# Patient Record
Sex: Female | Born: 1975 | Race: Black or African American | Hispanic: No | Marital: Single | Smoking: Never smoker
Health system: Southern US, Community
[De-identification: ages and names within clinical notes are randomized; demographics above are authoritative.]

## PROBLEM LIST (undated history)

## (undated) DIAGNOSIS — F329 Major depressive disorder, single episode, unspecified: Secondary | ICD-10-CM

## (undated) DIAGNOSIS — I639 Cerebral infarction, unspecified: Secondary | ICD-10-CM

## (undated) DIAGNOSIS — G43909 Migraine, unspecified, not intractable, without status migrainosus: Secondary | ICD-10-CM

## (undated) DIAGNOSIS — E119 Type 2 diabetes mellitus without complications: Secondary | ICD-10-CM

## (undated) DIAGNOSIS — F419 Anxiety disorder, unspecified: Secondary | ICD-10-CM

## (undated) DIAGNOSIS — E282 Polycystic ovarian syndrome: Secondary | ICD-10-CM

## (undated) DIAGNOSIS — E669 Obesity, unspecified: Secondary | ICD-10-CM

## (undated) DIAGNOSIS — F32A Depression, unspecified: Secondary | ICD-10-CM

## (undated) DIAGNOSIS — M542 Cervicalgia: Secondary | ICD-10-CM

## (undated) DIAGNOSIS — E041 Nontoxic single thyroid nodule: Secondary | ICD-10-CM

## (undated) DIAGNOSIS — E785 Hyperlipidemia, unspecified: Secondary | ICD-10-CM

## (undated) DIAGNOSIS — M2550 Pain in unspecified joint: Secondary | ICD-10-CM

## (undated) DIAGNOSIS — D573 Sickle-cell trait: Secondary | ICD-10-CM

## (undated) DIAGNOSIS — R531 Weakness: Secondary | ICD-10-CM

## (undated) DIAGNOSIS — M25519 Pain in unspecified shoulder: Secondary | ICD-10-CM

## (undated) DIAGNOSIS — I10 Essential (primary) hypertension: Secondary | ICD-10-CM

## (undated) DIAGNOSIS — R569 Unspecified convulsions: Secondary | ICD-10-CM

## (undated) HISTORY — DX: Essential (primary) hypertension: I10

## (undated) HISTORY — DX: Type 2 diabetes mellitus without complications: E11.9

## (undated) HISTORY — DX: Unspecified convulsions: R56.9

## (undated) HISTORY — DX: Obesity, unspecified: E66.9

## (undated) HISTORY — DX: Depression, unspecified: F32.A

## (undated) HISTORY — DX: Anxiety disorder, unspecified: F41.9

## (undated) HISTORY — DX: Major depressive disorder, single episode, unspecified: F32.9

## (undated) HISTORY — DX: Cerebral infarction, unspecified: I63.9

## (undated) HISTORY — DX: Hyperlipidemia, unspecified: E78.5

## (undated) HISTORY — DX: Weakness: R53.1

## (undated) HISTORY — DX: Migraine, unspecified, not intractable, without status migrainosus: G43.909

## (undated) HISTORY — PX: LAPAROSCOPIC CHOLECYSTECTOMY: SUR755

## (undated) HISTORY — DX: Cervicalgia: M54.2

## (undated) HISTORY — DX: Pain in unspecified joint: M25.50

## (undated) HISTORY — DX: Pain in unspecified shoulder: M25.519

## (undated) HISTORY — DX: Nontoxic single thyroid nodule: E04.1

---

## 1998-09-30 ENCOUNTER — Ambulatory Visit (HOSPITAL_COMMUNITY): Admission: RE | Admit: 1998-09-30 | Discharge: 1998-09-30 | Payer: Self-pay | Admitting: Family Medicine

## 1998-09-30 ENCOUNTER — Encounter: Payer: Self-pay | Admitting: Family Medicine

## 1999-05-28 ENCOUNTER — Emergency Department (HOSPITAL_COMMUNITY): Admission: EM | Admit: 1999-05-28 | Discharge: 1999-05-28 | Payer: Self-pay | Admitting: Emergency Medicine

## 1999-06-09 ENCOUNTER — Inpatient Hospital Stay (HOSPITAL_COMMUNITY): Admission: AD | Admit: 1999-06-09 | Discharge: 1999-06-15 | Payer: Self-pay | Admitting: Family Medicine

## 1999-06-19 ENCOUNTER — Inpatient Hospital Stay (HOSPITAL_COMMUNITY): Admission: AD | Admit: 1999-06-19 | Discharge: 1999-06-23 | Payer: Self-pay | Admitting: Family Medicine

## 1999-06-22 ENCOUNTER — Encounter: Payer: Self-pay | Admitting: Family Medicine

## 1999-07-02 ENCOUNTER — Inpatient Hospital Stay (HOSPITAL_COMMUNITY): Admission: AD | Admit: 1999-07-02 | Discharge: 1999-07-20 | Payer: Self-pay | Admitting: Family Medicine

## 1999-07-02 ENCOUNTER — Encounter: Payer: Self-pay | Admitting: Family Medicine

## 1999-07-11 ENCOUNTER — Encounter: Payer: Self-pay | Admitting: Family Medicine

## 1999-07-30 ENCOUNTER — Ambulatory Visit (HOSPITAL_COMMUNITY): Admission: RE | Admit: 1999-07-30 | Discharge: 1999-07-30 | Payer: Self-pay | Admitting: Internal Medicine

## 1999-07-30 ENCOUNTER — Encounter: Payer: Self-pay | Admitting: Internal Medicine

## 1999-08-10 ENCOUNTER — Observation Stay (HOSPITAL_COMMUNITY): Admission: EM | Admit: 1999-08-10 | Discharge: 1999-08-11 | Payer: Self-pay | Admitting: Family Medicine

## 1999-08-11 ENCOUNTER — Encounter: Payer: Self-pay | Admitting: Family Medicine

## 1999-08-12 ENCOUNTER — Inpatient Hospital Stay (HOSPITAL_COMMUNITY): Admission: EM | Admit: 1999-08-12 | Discharge: 1999-08-23 | Payer: Self-pay | Admitting: Family Medicine

## 1999-08-21 ENCOUNTER — Encounter: Payer: Self-pay | Admitting: Family Medicine

## 1999-08-26 ENCOUNTER — Observation Stay (HOSPITAL_COMMUNITY): Admission: AD | Admit: 1999-08-26 | Discharge: 1999-08-27 | Payer: Self-pay | Admitting: Family Medicine

## 1999-08-28 ENCOUNTER — Inpatient Hospital Stay (HOSPITAL_COMMUNITY): Admission: EM | Admit: 1999-08-28 | Discharge: 1999-09-02 | Payer: Self-pay | Admitting: Family Medicine

## 1999-09-07 ENCOUNTER — Observation Stay (HOSPITAL_COMMUNITY): Admission: EM | Admit: 1999-09-07 | Discharge: 1999-09-07 | Payer: Self-pay | Admitting: Emergency Medicine

## 1999-10-11 ENCOUNTER — Inpatient Hospital Stay (HOSPITAL_COMMUNITY): Admission: AD | Admit: 1999-10-11 | Discharge: 1999-10-15 | Payer: Self-pay | Admitting: Family Medicine

## 1999-10-15 ENCOUNTER — Encounter: Payer: Self-pay | Admitting: Family Medicine

## 1999-11-29 ENCOUNTER — Encounter: Payer: Self-pay | Admitting: Family Medicine

## 1999-11-29 ENCOUNTER — Ambulatory Visit (HOSPITAL_COMMUNITY): Admission: RE | Admit: 1999-11-29 | Discharge: 1999-11-29 | Payer: Self-pay | Admitting: Family Medicine

## 1999-12-17 ENCOUNTER — Encounter: Admission: RE | Admit: 1999-12-17 | Discharge: 1999-12-17 | Payer: Self-pay | Admitting: Gastroenterology

## 1999-12-17 ENCOUNTER — Encounter: Payer: Self-pay | Admitting: Gastroenterology

## 2000-10-24 ENCOUNTER — Inpatient Hospital Stay (HOSPITAL_COMMUNITY): Admission: EM | Admit: 2000-10-24 | Discharge: 2000-10-31 | Payer: Self-pay | Admitting: Emergency Medicine

## 2000-10-24 ENCOUNTER — Encounter: Payer: Self-pay | Admitting: Emergency Medicine

## 2000-10-26 ENCOUNTER — Encounter: Payer: Self-pay | Admitting: Family Medicine

## 2000-10-27 ENCOUNTER — Encounter: Payer: Self-pay | Admitting: Family Medicine

## 2000-10-31 ENCOUNTER — Encounter: Payer: Self-pay | Admitting: Family Medicine

## 2000-12-06 ENCOUNTER — Emergency Department (HOSPITAL_COMMUNITY): Admission: EM | Admit: 2000-12-06 | Discharge: 2000-12-07 | Payer: Self-pay | Admitting: Emergency Medicine

## 2000-12-07 ENCOUNTER — Encounter: Payer: Self-pay | Admitting: Emergency Medicine

## 2001-05-20 ENCOUNTER — Inpatient Hospital Stay (HOSPITAL_COMMUNITY): Admission: EM | Admit: 2001-05-20 | Discharge: 2001-05-25 | Payer: Self-pay | Admitting: Emergency Medicine

## 2001-05-21 ENCOUNTER — Encounter: Payer: Self-pay | Admitting: Family Medicine

## 2001-05-24 ENCOUNTER — Encounter: Payer: Self-pay | Admitting: Family Medicine

## 2002-01-30 ENCOUNTER — Inpatient Hospital Stay (HOSPITAL_COMMUNITY): Admission: EM | Admit: 2002-01-30 | Discharge: 2002-02-06 | Payer: Self-pay | Admitting: *Deleted

## 2002-09-29 ENCOUNTER — Inpatient Hospital Stay (HOSPITAL_COMMUNITY): Admission: EM | Admit: 2002-09-29 | Discharge: 2002-10-05 | Payer: Self-pay | Admitting: Emergency Medicine

## 2002-09-30 ENCOUNTER — Encounter: Payer: Self-pay | Admitting: Family Medicine

## 2003-05-17 ENCOUNTER — Inpatient Hospital Stay (HOSPITAL_COMMUNITY): Admission: RE | Admit: 2003-05-17 | Discharge: 2003-05-24 | Payer: Self-pay | Admitting: Family Medicine

## 2003-05-17 ENCOUNTER — Encounter: Payer: Self-pay | Admitting: Emergency Medicine

## 2003-05-20 ENCOUNTER — Encounter (HOSPITAL_BASED_OUTPATIENT_CLINIC_OR_DEPARTMENT_OTHER): Payer: Self-pay | Admitting: General Surgery

## 2003-05-21 ENCOUNTER — Encounter (HOSPITAL_BASED_OUTPATIENT_CLINIC_OR_DEPARTMENT_OTHER): Payer: Self-pay | Admitting: General Surgery

## 2003-09-05 ENCOUNTER — Inpatient Hospital Stay (HOSPITAL_COMMUNITY): Admission: EM | Admit: 2003-09-05 | Discharge: 2003-09-13 | Payer: Self-pay | Admitting: Emergency Medicine

## 2003-09-05 ENCOUNTER — Encounter: Payer: Self-pay | Admitting: Family Medicine

## 2003-09-05 ENCOUNTER — Encounter: Payer: Self-pay | Admitting: Emergency Medicine

## 2003-09-07 ENCOUNTER — Encounter: Payer: Self-pay | Admitting: Hematology and Oncology

## 2003-09-08 ENCOUNTER — Encounter: Payer: Self-pay | Admitting: Hematology and Oncology

## 2003-11-01 ENCOUNTER — Inpatient Hospital Stay (HOSPITAL_COMMUNITY): Admission: EM | Admit: 2003-11-01 | Discharge: 2003-11-07 | Payer: Self-pay | Admitting: Emergency Medicine

## 2004-02-11 ENCOUNTER — Inpatient Hospital Stay (HOSPITAL_COMMUNITY): Admission: EM | Admit: 2004-02-11 | Discharge: 2004-02-15 | Payer: Self-pay

## 2004-12-16 ENCOUNTER — Inpatient Hospital Stay (HOSPITAL_COMMUNITY): Admission: EM | Admit: 2004-12-16 | Discharge: 2004-12-22 | Payer: Self-pay | Admitting: Family Medicine

## 2006-05-05 ENCOUNTER — Ambulatory Visit: Payer: Self-pay | Admitting: Critical Care Medicine

## 2006-05-05 ENCOUNTER — Inpatient Hospital Stay (HOSPITAL_COMMUNITY): Admission: EM | Admit: 2006-05-05 | Discharge: 2006-05-10 | Payer: Self-pay | Admitting: Emergency Medicine

## 2006-09-27 ENCOUNTER — Emergency Department (HOSPITAL_COMMUNITY): Admission: EM | Admit: 2006-09-27 | Discharge: 2006-09-27 | Payer: Self-pay | Admitting: Emergency Medicine

## 2006-10-16 ENCOUNTER — Emergency Department (HOSPITAL_COMMUNITY): Admission: EM | Admit: 2006-10-16 | Discharge: 2006-10-16 | Payer: Self-pay | Admitting: Emergency Medicine

## 2017-02-25 ENCOUNTER — Inpatient Hospital Stay (HOSPITAL_COMMUNITY)
Admission: EM | Admit: 2017-02-25 | Discharge: 2017-03-02 | DRG: 064 | Disposition: A | Discharge status: Rehabilitation Facility | Payer: Medicare Other | Attending: Internal Medicine | Admitting: Internal Medicine

## 2017-02-25 ENCOUNTER — Emergency Department (HOSPITAL_COMMUNITY): Payer: Medicare Other

## 2017-02-25 ENCOUNTER — Encounter (HOSPITAL_COMMUNITY): Payer: Self-pay | Admitting: Radiology

## 2017-02-25 ENCOUNTER — Inpatient Hospital Stay (HOSPITAL_COMMUNITY): Payer: Medicare Other

## 2017-02-25 DIAGNOSIS — G44319 Acute post-traumatic headache, not intractable: Secondary | ICD-10-CM | POA: Diagnosis not present

## 2017-02-25 DIAGNOSIS — H539 Unspecified visual disturbance: Secondary | ICD-10-CM | POA: Diagnosis not present

## 2017-02-25 DIAGNOSIS — G44029 Chronic cluster headache, not intractable: Secondary | ICD-10-CM | POA: Diagnosis not present

## 2017-02-25 DIAGNOSIS — E041 Nontoxic single thyroid nodule: Secondary | ICD-10-CM | POA: Diagnosis not present

## 2017-02-25 DIAGNOSIS — I69391 Dysphagia following cerebral infarction: Secondary | ICD-10-CM | POA: Diagnosis not present

## 2017-02-25 DIAGNOSIS — Z8673 Personal history of transient ischemic attack (TIA), and cerebral infarction without residual deficits: Secondary | ICD-10-CM

## 2017-02-25 DIAGNOSIS — D571 Sickle-cell disease without crisis: Secondary | ICD-10-CM

## 2017-02-25 DIAGNOSIS — E669 Obesity, unspecified: Secondary | ICD-10-CM | POA: Diagnosis present

## 2017-02-25 DIAGNOSIS — R402134 Coma scale, eyes open, to sound, 24 hours or more after hospital admission: Secondary | ICD-10-CM | POA: Diagnosis present

## 2017-02-25 DIAGNOSIS — G8191 Hemiplegia, unspecified affecting right dominant side: Secondary | ICD-10-CM | POA: Diagnosis not present

## 2017-02-25 DIAGNOSIS — I6789 Other cerebrovascular disease: Secondary | ICD-10-CM | POA: Diagnosis not present

## 2017-02-25 DIAGNOSIS — D5700 Hb-SS disease with crisis, unspecified: Secondary | ICD-10-CM | POA: Diagnosis not present

## 2017-02-25 DIAGNOSIS — Q613 Polycystic kidney, unspecified: Secondary | ICD-10-CM

## 2017-02-25 DIAGNOSIS — I10 Essential (primary) hypertension: Secondary | ICD-10-CM | POA: Diagnosis present

## 2017-02-25 DIAGNOSIS — E784 Other hyperlipidemia: Secondary | ICD-10-CM | POA: Diagnosis not present

## 2017-02-25 DIAGNOSIS — H547 Unspecified visual loss: Secondary | ICD-10-CM | POA: Diagnosis present

## 2017-02-25 DIAGNOSIS — D573 Sickle-cell trait: Secondary | ICD-10-CM | POA: Diagnosis not present

## 2017-02-25 DIAGNOSIS — R29705 NIHSS score 5: Secondary | ICD-10-CM | POA: Diagnosis present

## 2017-02-25 DIAGNOSIS — I638 Other cerebral infarction: Secondary | ICD-10-CM

## 2017-02-25 DIAGNOSIS — I63311 Cerebral infarction due to thrombosis of right middle cerebral artery: Secondary | ICD-10-CM | POA: Diagnosis not present

## 2017-02-25 DIAGNOSIS — R1310 Dysphagia, unspecified: Secondary | ICD-10-CM | POA: Diagnosis present

## 2017-02-25 DIAGNOSIS — R531 Weakness: Secondary | ICD-10-CM | POA: Diagnosis not present

## 2017-02-25 DIAGNOSIS — E785 Hyperlipidemia, unspecified: Secondary | ICD-10-CM | POA: Diagnosis present

## 2017-02-25 DIAGNOSIS — D509 Iron deficiency anemia, unspecified: Secondary | ICD-10-CM | POA: Diagnosis not present

## 2017-02-25 DIAGNOSIS — E282 Polycystic ovarian syndrome: Secondary | ICD-10-CM | POA: Diagnosis present

## 2017-02-25 DIAGNOSIS — I6300 Cerebral infarction due to thrombosis of unspecified precerebral artery: Secondary | ICD-10-CM | POA: Diagnosis not present

## 2017-02-25 DIAGNOSIS — M25552 Pain in left hip: Secondary | ICD-10-CM | POA: Diagnosis not present

## 2017-02-25 DIAGNOSIS — G9341 Metabolic encephalopathy: Secondary | ICD-10-CM | POA: Diagnosis present

## 2017-02-25 DIAGNOSIS — I679 Cerebrovascular disease, unspecified: Secondary | ICD-10-CM | POA: Diagnosis not present

## 2017-02-25 DIAGNOSIS — R51 Headache: Secondary | ICD-10-CM | POA: Diagnosis not present

## 2017-02-25 DIAGNOSIS — R7303 Prediabetes: Secondary | ICD-10-CM | POA: Diagnosis not present

## 2017-02-25 DIAGNOSIS — R402364 Coma scale, best motor response, obeys commands, 24 hours or more after hospital admission: Secondary | ICD-10-CM | POA: Diagnosis present

## 2017-02-25 DIAGNOSIS — R402254 Coma scale, best verbal response, oriented, 24 hours or more after hospital admission: Secondary | ICD-10-CM | POA: Diagnosis present

## 2017-02-25 DIAGNOSIS — I639 Cerebral infarction, unspecified: Principal | ICD-10-CM | POA: Diagnosis present

## 2017-02-25 DIAGNOSIS — I69398 Other sequelae of cerebral infarction: Secondary | ICD-10-CM | POA: Diagnosis not present

## 2017-02-25 DIAGNOSIS — I619 Nontraumatic intracerebral hemorrhage, unspecified: Secondary | ICD-10-CM | POA: Diagnosis not present

## 2017-02-25 DIAGNOSIS — H5310 Unspecified subjective visual disturbances: Secondary | ICD-10-CM | POA: Diagnosis not present

## 2017-02-25 DIAGNOSIS — R269 Unspecified abnormalities of gait and mobility: Secondary | ICD-10-CM | POA: Diagnosis not present

## 2017-02-25 DIAGNOSIS — E781 Pure hyperglyceridemia: Secondary | ICD-10-CM | POA: Diagnosis not present

## 2017-02-25 DIAGNOSIS — J9811 Atelectasis: Secondary | ICD-10-CM | POA: Diagnosis not present

## 2017-02-25 DIAGNOSIS — R29810 Facial weakness: Secondary | ICD-10-CM | POA: Diagnosis not present

## 2017-02-25 DIAGNOSIS — Z6841 Body Mass Index (BMI) 40.0 and over, adult: Secondary | ICD-10-CM | POA: Diagnosis not present

## 2017-02-25 DIAGNOSIS — I63411 Cerebral infarction due to embolism of right middle cerebral artery: Secondary | ICD-10-CM | POA: Diagnosis not present

## 2017-02-25 DIAGNOSIS — H5340 Unspecified visual field defects: Secondary | ICD-10-CM | POA: Diagnosis not present

## 2017-02-25 DIAGNOSIS — D72829 Elevated white blood cell count, unspecified: Secondary | ICD-10-CM | POA: Diagnosis not present

## 2017-02-25 DIAGNOSIS — M25551 Pain in right hip: Secondary | ICD-10-CM | POA: Diagnosis not present

## 2017-02-25 DIAGNOSIS — F4322 Adjustment disorder with anxiety: Secondary | ICD-10-CM | POA: Diagnosis not present

## 2017-02-25 DIAGNOSIS — R519 Headache, unspecified: Secondary | ICD-10-CM

## 2017-02-25 HISTORY — DX: Sickle-cell trait: D57.3

## 2017-02-25 HISTORY — DX: Polycystic ovarian syndrome: E28.2

## 2017-02-25 LAB — COMPREHENSIVE METABOLIC PANEL
ALK PHOS: 76 U/L (ref 38–126)
ALT: 19 U/L (ref 14–54)
AST: 20 U/L (ref 15–41)
Albumin: 3.6 g/dL (ref 3.5–5.0)
Anion gap: 10 (ref 5–15)
BUN: 10 mg/dL (ref 6–20)
CALCIUM: 9.2 mg/dL (ref 8.9–10.3)
CO2: 26 mmol/L (ref 22–32)
CREATININE: 1.08 mg/dL — AB (ref 0.44–1.00)
Chloride: 104 mmol/L (ref 101–111)
GFR calc non Af Amer: >60 mL/min (ref >60)
Glucose, Bld: 134 mg/dL — ABNORMAL HIGH (ref 65–99)
Potassium: 4 mmol/L (ref 3.5–5.1)
SODIUM: 140 mmol/L (ref 135–145)
Total Bilirubin: 0.3 mg/dL (ref 0.3–1.2)
Total Protein: 6.4 g/dL — ABNORMAL LOW (ref 6.5–8.1)

## 2017-02-25 LAB — DIFFERENTIAL
Basophils Absolute: 0.1 10*3/uL (ref 0.0–0.1)
Basophils Relative: 1 %
EOS PCT: 1 %
Eosinophils Absolute: 0.1 10*3/uL (ref 0.0–0.7)
LYMPHS ABS: 3.7 10*3/uL (ref 0.7–4.0)
LYMPHS PCT: 47 %
MONO ABS: 0.6 10*3/uL (ref 0.1–1.0)
Monocytes Relative: 7 %
Neutro Abs: 3.5 10*3/uL (ref 1.7–7.7)
Neutrophils Relative %: 44 %

## 2017-02-25 LAB — CBC
HCT: 39.4 % (ref 36.0–46.0)
HCT: 39.8 % (ref 36.0–46.0)
Hemoglobin: 13.1 g/dL (ref 12.0–15.0)
Hemoglobin: 13.3 g/dL (ref 12.0–15.0)
MCH: 25.3 pg — AB (ref 26.0–34.0)
MCH: 25.1 pg — ABNORMAL LOW (ref 26.0–34.0)
MCHC: 33.2 g/dL (ref 30.0–36.0)
MCHC: 33.4 g/dL (ref 30.0–36.0)
MCV: 76.2 fL — AB (ref 78.0–100.0)
MCV: 75.2 fL — ABNORMAL LOW (ref 78.0–100.0)
PLATELETS: 297 10*3/uL (ref 150–400)
Platelets: 303 10*3/uL (ref 150–400)
RBC: 5.17 MIL/uL — AB (ref 3.87–5.11)
RBC: 5.29 MIL/uL — ABNORMAL HIGH (ref 3.87–5.11)
RDW: 15 % (ref 11.5–15.5)
RDW: 15.4 % (ref 11.5–15.5)
WBC: 8 10*3/uL (ref 4.0–10.5)
WBC: 8.3 10*3/uL (ref 4.0–10.5)

## 2017-02-25 LAB — PROTIME-INR
INR: 1.08
PROTHROMBIN TIME: 14 s (ref 11.4–15.2)

## 2017-02-25 LAB — I-STAT TROPONIN, ED: Troponin i, poc: 0 ng/mL (ref 0.00–0.08)

## 2017-02-25 LAB — I-STAT BETA HCG BLOOD, ED (MC, WL, AP ONLY): I-stat hCG, quantitative: <5 m[IU]/mL (ref <5)

## 2017-02-25 LAB — I-STAT CHEM 8, ED
BUN: 13 mg/dL (ref 6–20)
CREATININE: 1 mg/dL (ref 0.44–1.00)
Calcium, Ion: 1.2 mmol/L (ref 1.15–1.40)
Chloride: 106 mmol/L (ref 101–111)
Glucose, Bld: 124 mg/dL — ABNORMAL HIGH (ref 65–99)
HCT: 43 % (ref 36.0–46.0)
Hemoglobin: 14.6 g/dL (ref 12.0–15.0)
Potassium: 3.6 mmol/L (ref 3.5–5.1)
SODIUM: 142 mmol/L (ref 135–145)
TCO2: 28 mmol/L (ref 0–100)

## 2017-02-25 LAB — CREATININE, SERUM
CREATININE: 0.91 mg/dL (ref 0.44–1.00)
GFR calc Af Amer: >60 mL/min (ref >60)

## 2017-02-25 LAB — MAGNESIUM: MAGNESIUM: 2 mg/dL (ref 1.7–2.4)

## 2017-02-25 LAB — APTT: aPTT: 33 seconds (ref 24–36)

## 2017-02-25 MED ORDER — STROKE: EARLY STAGES OF RECOVERY BOOK
Freq: Once | Status: DC
  Filled 2017-02-25: qty 1

## 2017-02-25 MED ORDER — HEPARIN SODIUM (PORCINE) 5000 UNIT/ML IJ SOLN
5000.0000 [IU] | Freq: Three times a day (TID) | INTRAMUSCULAR | Status: DC
  Administered 2017-02-25 – 2017-03-02 (×15): 5000 [IU] via SUBCUTANEOUS
  Filled 2017-02-25 (×15): qty 1

## 2017-02-25 MED ORDER — ASPIRIN 325 MG PO TABS
325.0000 mg | ORAL_TABLET | Freq: Once | ORAL | Status: DC
Start: 2017-02-25 — End: 2017-03-02
  Filled 2017-02-25 (×2): qty 1

## 2017-02-25 MED ORDER — ASPIRIN 300 MG RE SUPP: 300.0000 mg | Freq: Every day | RECTAL | Status: DC

## 2017-02-25 MED ORDER — HYDRALAZINE HCL 20 MG/ML IJ SOLN: 10.0000 mg | Freq: Three times a day (TID) | INTRAMUSCULAR | Status: DC | PRN

## 2017-02-25 MED ORDER — VITAMIN B-12 100 MCG PO TABS
100.0000 ug | ORAL_TABLET | Freq: Every day | ORAL | Status: DC
  Administered 2017-02-26 – 2017-03-02 (×5): 100 ug via ORAL
  Filled 2017-02-25 (×5): qty 1

## 2017-02-25 MED ORDER — ACETAMINOPHEN 650 MG RE SUPP
650.0000 mg | RECTAL | Status: DC | PRN
  Administered 2017-02-26: 650 mg via RECTAL
  Filled 2017-02-25: qty 1

## 2017-02-25 MED ORDER — SENNOSIDES-DOCUSATE SODIUM 8.6-50 MG PO TABS: 1.0000 | ORAL_TABLET | Freq: Every evening | ORAL | Status: DC | PRN

## 2017-02-25 MED ORDER — IOPAMIDOL (ISOVUE-370) INJECTION 76%
INTRAVENOUS | Status: AC
  Administered 2017-02-25: 100 mL
  Filled 2017-02-25: qty 100

## 2017-02-25 MED ORDER — ASPIRIN 325 MG PO TABS
325.0000 mg | ORAL_TABLET | Freq: Every day | ORAL | Status: DC
  Administered 2017-02-26 – 2017-03-02 (×5): 325 mg via ORAL
  Filled 2017-02-25 (×5): qty 1

## 2017-02-25 MED ORDER — KCL IN DEXTROSE-NACL 20-5-0.45 MEQ/L-%-% IV SOLN
INTRAVENOUS | Status: AC
  Administered 2017-02-25: 18:00:00 via INTRAVENOUS
  Filled 2017-02-25: qty 1000

## 2017-02-25 MED ORDER — LABETALOL HCL 5 MG/ML IV SOLN
10.0000 mg | Freq: Once | INTRAVENOUS | Status: AC
  Administered 2017-02-25: 10 mg via INTRAVENOUS

## 2017-02-25 MED ORDER — NICARDIPINE HCL IN NACL 20-0.86 MG/200ML-% IV SOLN
INTRAVENOUS | Status: AC
  Filled 2017-02-25: qty 200

## 2017-02-25 MED ORDER — ACETAMINOPHEN 325 MG PO TABS
650.0000 mg | ORAL_TABLET | ORAL | Status: DC | PRN
  Administered 2017-02-26: 650 mg via ORAL
  Filled 2017-02-25: qty 2

## 2017-02-25 MED ORDER — ACETAMINOPHEN 160 MG/5ML PO SOLN: 650.0000 mg | ORAL | Status: DC | PRN

## 2017-02-25 NOTE — ED Notes (Signed)
No TPA will be given, due to HTN, and unclear timeframe.

## 2017-02-25 NOTE — Consult Note (Signed)
Reason for Consult:  Code Stroke Referring Physician: Dr. Blake Chan is an 41 y.o. female.  HPI: Patients says that yesterday she had some numbness in the right leg around the knee causing her leg to buckle.  She thought it was because she was walking too much and ignored it.  This morning she woke up at 8 am feeling OK overall except for some possible mild right face and hand numbness.  She got out of bed at 11 am and then noticed that she has significant right sided numbness and tingling.  Upon arrival to the ER she had very high BP.  No prior history of hypertension.  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  There was a hypodensity in the left thalamus of unclear age.  CTA Brain showed multiple intracranial stenosis which could be [place] disease related vs other differentials such as CNS vasculitis.  CTA Neck is normal.  She was a vague historian and her neurological was bizarre in presentation as it was associated with confusion and encephalopathy.  Therefore, I did a stat MRI Brain which showed an acute infarct in the left thalamus.  MRA showed similar beading of intracranial vessels.   She was not taking any medications at home, including no antiplatelets.  She had not had any major sickle cell crisis for many years.    She has no known hyperlipidemia, hypertension, diabetes, and she is not a smoker.  She denies illicit drug use.    Patient received total of 30 mg IV Labetolol but her BP remained outside of the parameters for IV tPA.    Past Medical History:  Diagnosis Date  . Polycystic disease, ovaries   . Sickle cell disease ([place])     No past surgical history on file.  No family history on file.  Social History:  reports that she has never smoked. She has never used smokeless tobacco. She reports that she does not drink alcohol. Her drug history is not on file.  Allergies: No Known Allergies  Prior to Admission medications    Medication Sig Start Date End Date Taking? Authorizing Provider  Cyanocobalamin (VITAMIN B-12 PO) Take 2 tablets by mouth daily.   Yes Historical Provider, MD  MAGNESIUM PO Take 1 tablet by mouth at bedtime.   Yes Historical Provider, MD    Medications: Scheduled:   Results for orders placed or performed during the hospital encounter of 02/25/17 (from the past 48 hour(s))  I-stat troponin, ED     Status: None   Collection Time: 02/25/17  1:06 PM  Result Value Ref Range   Troponin i, poc 0.00 0.00 - 0.08 ng/mL   Comment 3            Comment: Due to the release kinetics of cTnI, a negative result within the first hours of the onset of symptoms does not rule out myocardial infarction with certainty. If myocardial infarction is still suspected, repeat the test at appropriate intervals.   I-Stat Beta hCG blood, ED ([facility], [facility], [facility] only)     Status: None   Collection Time: 02/25/17  1:06 PM  Result Value Ref Range   I-stat hCG, quantitative <5.0 <5 mIU/mL   Comment 3            Comment:   GEST. AGE      CONC.  (mIU/mL)   <=1 WEEK        5 - 50  2 WEEKS       50 - 500     3 WEEKS       100 - 10,000     4 WEEKS     1,000 - 30,000        FEMALE AND NON-PREGNANT FEMALE:     LESS THAN 5 mIU/mL   I-Stat Chem 8, ED     Status: Abnormal   Collection Time: 02/25/17  1:07 PM  Result Value Ref Range   Sodium 142 135 - 145 mmol/L   Potassium 3.6 3.5 - 5.1 mmol/L   Chloride 106 101 - 111 mmol/L   BUN 13 6 - 20 mg/dL   Creatinine, Ser 1.00 0.44 - 1.00 mg/dL   Glucose, Bld 124 (H) 65 - 99 mg/dL   Calcium, Ion 1.20 1.15 - 1.40 mmol/L   TCO2 28 0 - 100 mmol/L   Hemoglobin 14.6 12.0 - 15.0 g/dL   HCT 43.0 36.0 - 46.0 %  Protime-INR     Status: None   Collection Time: 02/25/17  1:10 PM  Result Value Ref Range   Prothrombin Time 14.0 11.4 - 15.2 seconds   INR 1.08   APTT     Status: None   Collection Time: 02/25/17  1:10 PM  Result Value Ref Range   aPTT 33 24 - 36 seconds  CBC      Status: Abnormal   Collection Time: 02/25/17  1:10 PM  Result Value Ref Range   WBC 8.0 4.0 - 10.5 K/uL   RBC 5.17 (H) 3.87 - 5.11 MIL/uL   Hemoglobin 13.1 12.0 - 15.0 g/dL   HCT 39.4 36.0 - 46.0 %   MCV 76.2 (L) 78.0 - 100.0 fL   MCH 25.3 (L) 26.0 - 34.0 pg   MCHC 33.2 30.0 - 36.0 g/dL   RDW 15.0 11.5 - 15.5 %   Platelets 297 150 - 400 K/uL  Differential     Status: None   Collection Time: 02/25/17  1:10 PM  Result Value Ref Range   Neutrophils Relative % 44 %   Neutro Abs 3.5 1.7 - 7.7 K/uL   Lymphocytes Relative 47 %   Lymphs Abs 3.7 0.7 - 4.0 K/uL   Monocytes Relative 7 %   Monocytes Absolute 0.6 0.1 - 1.0 K/uL   Eosinophils Relative 1 %   Eosinophils Absolute 0.1 0.0 - 0.7 K/uL   Basophils Relative 1 %   Basophils Absolute 0.1 0.0 - 0.1 K/uL  Comprehensive metabolic panel     Status: Abnormal   Collection Time: 02/25/17  1:10 PM  Result Value Ref Range   Sodium 140 135 - 145 mmol/L   Potassium 4.0 3.5 - 5.1 mmol/L   Chloride 104 101 - 111 mmol/L   CO2 26 22 - 32 mmol/L   Glucose, Bld 134 (H) 65 - 99 mg/dL   BUN 10 6 - 20 mg/dL   Creatinine, Ser 1.08 (H) 0.44 - 1.00 mg/dL   Calcium 9.2 8.9 - 10.3 mg/dL   Total Protein 6.4 (L) 6.5 - 8.1 g/dL   Albumin 3.6 3.5 - 5.0 g/dL   AST 20 15 - 41 U/L   ALT 19 14 - 54 U/L   Alkaline Phosphatase 76 38 - 126 U/L   Total Bilirubin 0.3 0.3 - 1.2 mg/dL   GFR calc non Af Amer >60 >60 mL/min   GFR calc Af Amer >60 >60 mL/min    Comment: (NOTE) The eGFR has been calculated using the CKD   EPI equation. This calculation has not been validated in all clinical situations. eGFR's persistently <60 mL/min signify possible Chronic Kidney Disease.    Anion gap 10 5 - 15    Ct Angio Head W Or Wo Contrast  Addendum Date: 02/25/2017   ADDENDUM REPORT: 02/25/2017 14:30 ADDENDUM: Preliminary report of this exam was discussed by telephone with Dr. Alessandra Bevels on 02/25/2017 at 1343 hours. Electronically Signed   By: Genevie Ann M.D.   On: 02/25/2017  14:30   Result Date: 02/25/2017 CLINICAL DATA:  41 year old female with RIGHT side weakness. Confusion. Sickle cell disease. EXAM: CT ANGIOGRAPHY HEAD AND NECK TECHNIQUE: Multidetector CT imaging of the head and neck was performed using the standard protocol during bolus administration of intravenous contrast. Multiplanar CT image reconstructions and MIPs were obtained to evaluate the vascular anatomy. Carotid stenosis measurements (when applicable) are obtained utilizing NASCET criteria, using the distal internal carotid diameter as the denominator. CONTRAST:  100 mL Isovue 370 COMPARISON:  Head CT without contrast 1259 hours today. FINDINGS: CTA NECK Skeleton: No acute osseous abnormality identified. Cervical and thoracic endplates are relatively preserved. Visualized paranasal sinuses and mastoids are stable and well pneumatized. Upper chest: Partially visible cardiomegaly. Negative visualized thoracic aorta. No superior mediastinal lymphadenopathy. Negative visible lung parenchyma except for atelectasis. Other neck: Thyroid remarkable for 18 mm hypodense nodule on the left. Negative larynx, pharynx, parapharyngeal spaces, retropharyngeal space (partially retropharyngeal right carotid), sublingual space, submandibular glands and parotid glands. No cervical lymphadenopathy. Aortic arch: 3 vessel arch configuration, no atherosclerosis. Enhancement of small venous collaterals about the right shoulder. 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 Right carotid system: Mildly tortuous brachiocephalic artery with no stenosis. Negative proximal right CCA. Partially retropharyngeal right carotid bifurcation. Negative cervical right ICA aside from mild tortuosity. Left carotid system: Negative. Vertebral arteries:No proximal right subclavian stenosis. Non dominant right vertebral artery. Right vertebral artery origin and cervical right vertebral  arteries are within normal limits. No proximal left subclavian artery stenosis. Dominant left vertebral artery. No left vertebral origin stenosis. Tortuous left V1 segment. Mildly tortuous left V2 segment, no stenosis to the skullbase. CTA HEAD Posterior circulation: Distal left vertebral artery is dominant. No distal vertebral stenosis. Both PICA origins are patent. No basilar stenosis. Normal SCA origins. Normal left PCA origin. Fetal type right PCA. Left posterior communicating artery is present. Mild right P1 and P2 and moderate to severe left P1 and P2, irregularity and stenosis as seen on series 407, image 52. Preserved bilateral distal PCA flow. Anterior circulation: Both ICA siphons are patent without atherosclerosis or stenosis. Normal ophthalmic and posterior communicating artery origins. Patent carotid termini. However, there is moderate irregularity and stenosis at the right M1 origin (series 403, image 108). The right MCA M1 segment is patent but there is moderate to severe irregularity and stenosis also in the mid M1 (series 403, image 103 and see also series 407, image 52). Despite this the right MCA bifurcation is patent. No right M2 branch occlusion identified. Right MCA M2 and M3 branches are within normal limits. Left MCA origin and proximal M1 segment are normal. There is mild irregularity in the mid left M1 segment. The left MCA bifurcation is patent. There is moderate left M2 and moderate to severe left M3 irregularity and stenosis as seen on series 406, image 32. ACA origins and anterior communicating arteries are within normal limits. Proximal A2 branches are within normal  limits but there is severe left ACA A2 segment stenosis near the callosomarginal artery, and moderate to severe bilateral distal ACA/pericallosal artery irregularity and stenosis (series 406, image 25). Venous sinuses: Patent. Anatomic variants: Dominant left vertebral artery. Fetal type right PCA origin. Review of the MIP  images confirms the above findings IMPRESSION: 1. No atherosclerosis or stenosis in the neck, ICA siphons, or basilar artery. 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 Still, there is no emergent large vessel occlusion or target for endovascular intervention identified. 2. Moderate or severe stenoses of the: - Left PCA P1 and P2, - Right MCA origin and M1, - Left ACA A2 and bilateral distal pericallosal arteries, - Left MCA M2 and M3. 3. Left thyroid 18 mm nodule meets consensus criteria for nonemergent follow-up Thyroid Ultrasound characterization. Electronically Signed: By: H  Hall M.D. On: 02/25/2017 13:57   Ct Angio Neck W And/or Wo Contrast  Addendum Date: 02/25/2017   ADDENDUM REPORT: 02/25/2017 14:30 ADDENDUM: Preliminary report of this exam was discussed by telephone with Dr. S.  on 02/25/2017 at 1343 hours. Electronically Signed   By: H  Hall M.D.   On: 02/25/2017 14:30   Result Date: 02/25/2017 CLINICAL DATA:  41-year-old female with RIGHT side weakness. Confusion. Sickle cell disease. EXAM: CT ANGIOGRAPHY HEAD AND NECK TECHNIQUE: Multidetector CT imaging of the head and neck was performed using the standard protocol during bolus administration of intravenous contrast. Multiplanar CT image reconstructions and MIPs were obtained to evaluate the vascular anatomy. Carotid stenosis measurements (when applicable) are obtained utilizing NASCET criteria, using the distal internal carotid diameter as the denominator. CONTRAST:  100 mL Isovue 370 COMPARISON:  Head CT without contrast 1259 hours today. FINDINGS: CTA NECK Skeleton: No acute osseous abnormality identified. Cervical and thoracic endplates are relatively preserved. Visualized paranasal sinuses and mastoids are stable and well pneumatized. Upper chest: Partially visible cardiomegaly. Negative visualized thoracic aorta. No superior mediastinal lymphadenopathy. Negative visible lung  parenchyma except for atelectasis. Other neck: Thyroid remarkable for 18 mm hypodense nodule on the left. Negative larynx, pharynx, parapharyngeal spaces, retropharyngeal space (partially retropharyngeal right carotid), sublingual space, submandibular glands and parotid glands. No cervical lymphadenopathy. Aortic arch: 3 vessel arch configuration, no atherosclerosis. Enhancement of small venous collaterals about the right shoulder. 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 Right carotid system: Mildly tortuous brachiocephalic artery with no stenosis. Negative proximal right CCA. Partially retropharyngeal right carotid bifurcation. Negative cervical right ICA aside from mild tortuosity. Left carotid system: Negative. Vertebral arteries:No proximal right subclavian stenosis. Non dominant right vertebral artery. Right vertebral artery origin and cervical right vertebral arteries are within normal limits. No proximal left subclavian artery stenosis. Dominant left vertebral artery. No left vertebral origin stenosis. Tortuous left V1 segment. Mildly tortuous left V2 segment, no stenosis to the skullbase. CTA HEAD Posterior circulation: Distal left vertebral artery is dominant. No distal vertebral stenosis. Both PICA origins are patent. No basilar stenosis. Normal SCA origins. Normal left PCA origin. Fetal type right PCA. Left posterior communicating artery is present. Mild right P1 and P2 and moderate to severe left P1 and P2, irregularity and stenosis as seen on series 407, image 52. Preserved bilateral distal PCA flow. Anterior circulation: Both ICA siphons are patent without atherosclerosis or stenosis. Normal ophthalmic and posterior communicating artery origins. Patent carotid termini. However, there is moderate irregularity and stenosis at the right M1   origin (series 403, image 108). The right MCA M1 segment is patent but there is moderate  to severe irregularity and stenosis also in the mid M1 (series 403, image 103 and see also series 407, image 52). Despite this the right MCA bifurcation is patent. No right M2 branch occlusion identified. Right MCA M2 and M3 branches are within normal limits. Left MCA origin and proximal M1 segment are normal. There is mild irregularity in the mid left M1 segment. The left MCA bifurcation is patent. There is moderate left M2 and moderate to severe left M3 irregularity and stenosis as seen on series 406, image 32. ACA origins and anterior communicating arteries are within normal limits. Proximal A2 branches are within normal limits but there is severe left ACA A2 segment stenosis near the callosomarginal artery, and moderate to severe bilateral distal ACA/pericallosal artery irregularity and stenosis (series 406, image 25). Venous sinuses: Patent. Anatomic variants: Dominant left vertebral artery. Fetal type right PCA origin. Review of the MIP images confirms the above findings IMPRESSION: 1. No atherosclerosis or stenosis in the neck, ICA siphons, or basilar artery. 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 Still, there is no emergent large vessel occlusion or target for endovascular intervention identified. 2. Moderate or severe stenoses of the: - Left PCA P1 and P2, - Right MCA origin and M1, - Left ACA A2 and bilateral distal pericallosal arteries, - Left MCA M2 and M3. 3. Left thyroid 18 mm nodule meets consensus criteria for nonemergent follow-up Thyroid Ultrasound characterization. Electronically Signed: By: Genevie Ann M.D. On: 02/25/2017 13:57   Mr Jodene Nam Head Wo Contrast  Result Date: 02/25/2017 CLINICAL DATA:  Sudden onset of right upper and lower extremity weakness and facial droop. Hypertensive crisis. Sickle cell disease. EXAM: MRI HEAD WITHOUT CONTRAST MRA HEAD WITHOUT CONTRAST TECHNIQUE: Multiplanar, multiecho pulse sequences of the brain and  surrounding structures were obtained without intravenous contrast. Angiographic images of the head were obtained using MRA technique without contrast. COMPARISON:  CT head without contrast and CTA head and neck from the same day. FINDINGS: MRI HEAD FINDINGS Brain: Diffusion-weighted images demonstrate restricted diffusion within the 80 left lack infarct noted on CT. Areas of T2 shine through or noted in the corona radiata bilaterally. No additional acute infarcts are present. There are remote infarcts involving the right basal ganglia. Scattered subcortical T2 hyperintensities are highly advanced for age. Bilateral T2 hyperintensities are present within the corona radiata. A remote infarct is present in the genu of the left internal capsule. A remote infarct is noted along the left side of the body of the corpus callosum. Vascular: Flow is present in the major intracranial arteries. Skull and upper cervical spine: The skullbase is within normal limits. The craniocervical junction is unremarkable. Midline sagittal structures are within normal limits. The upper cervical spine is normal. Sinuses/Orbits: The paranasal sinuses and mastoid air cells are clear. The globes and orbits are within normal limits MRA HEAD FINDINGS The internal carotid arteries are within normal limits the high cervical segments through the ICA termini bilaterally. Mild narrowing is again seen at the origins of the right A1 and M1 segments. There is moderate narrowing of the distal right M1 segment. Segmental narrowing present throughout the MCA branch vessels bilaterally. The left vertebral artery is the dominant vessel. PICA origins are visualized and normal bilaterally. The basilar artery is normal. A moderate stenosis present proximal left posterior cerebral artery. The right posterior cerebral artery is  of fetal type. There is cecum at attenuation distal PCA branch vessels bilaterally. IMPRESSION: 1. Acute nonhemorrhagic infarcts involving  the left thalamus measures 9 mm maximally. 2. Remote infarcts involving the basal ganglia bilaterally. 3. Scattered white matter changes bilaterally are mildly advanced for age. This likely also reflects the sequela of microvascular ischemia or vasculitis. 4. Remote infarcts involving the body of the corpus callosum. 5. Extensive medium and small vessel disease evident on the MRA as described. Electronically Signed   By: Christopher  Mattern M.D.   On: 02/25/2017 15:07   Mr Brain Wo Contrast  Result Date: 02/25/2017 CLINICAL DATA:  Sudden onset of right upper and lower extremity weakness and facial droop. Hypertensive crisis. Sickle cell disease. EXAM: MRI HEAD WITHOUT CONTRAST MRA HEAD WITHOUT CONTRAST TECHNIQUE: Multiplanar, multiecho pulse sequences of the brain and surrounding structures were obtained without intravenous contrast. Angiographic images of the head were obtained using MRA technique without contrast. COMPARISON:  CT head without contrast and CTA head and neck from the same day. FINDINGS: MRI HEAD FINDINGS Brain: Diffusion-weighted images demonstrate restricted diffusion within the 80 left lack infarct noted on CT. Areas of T2 shine through or noted in the corona radiata bilaterally. No additional acute infarcts are present. There are remote infarcts involving the right basal ganglia. Scattered subcortical T2 hyperintensities are highly advanced for age. Bilateral T2 hyperintensities are present within the corona radiata. A remote infarct is present in the genu of the left internal capsule. A remote infarct is noted along the left side of the body of the corpus callosum. Vascular: Flow is present in the major intracranial arteries. Skull and upper cervical spine: The skullbase is within normal limits. The craniocervical junction is unremarkable. Midline sagittal structures are within normal limits. The upper cervical spine is normal. Sinuses/Orbits: The paranasal sinuses and mastoid air cells are  clear. The globes and orbits are within normal limits MRA HEAD FINDINGS The internal carotid arteries are within normal limits the high cervical segments through the ICA termini bilaterally. Mild narrowing is again seen at the origins of the right A1 and M1 segments. There is moderate narrowing of the distal right M1 segment. Segmental narrowing present throughout the MCA branch vessels bilaterally. The left vertebral artery is the dominant vessel. PICA origins are visualized and normal bilaterally. The basilar artery is normal. A moderate stenosis present proximal left posterior cerebral artery. The right posterior cerebral artery is of fetal type. There is cecum at attenuation distal PCA branch vessels bilaterally. IMPRESSION: 1. 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 2. Remote infarcts involving the basal ganglia bilaterally. 3. Scattered white matter changes bilaterally are mildly advanced for age. This likely also reflects the sequela of microvascular ischemia or vasculitis. 4. Remote infarcts involving the body of the corpus callosum. 5. Extensive medium and small vessel disease evident on the MRA as described. Electronically Signed   By: Christopher  Mattern M.D.   On: 02/25/2017 15:07   Ct Head Code Stroke W/o Cm  Result Date: 02/25/2017 CLINICAL DATA:  Code stroke. 41-year-old female last seen normal this morning. Left facial droop and left side numbness. Initial encounter. Sickle cell disease EXAM: CT HEAD WITHOUT CONTRAST TECHNIQUE: Contiguous axial images were obtained from the base of the skull through the vertex without intravenous contrast. COMPARISON:  Report of brain MRI 07/11/1999 (no images available). FINDINGS: Brain: Overall normal cerebral volume. Chronic appearing lacunar infarct right basal ganglia. Small age indeterminate hypodensity left thalamus (series 21, image 14).   Focal chronic appearing lacunar infarct in the body of the corpus callosum  (coronal image 34). Other small areas of cerebral white matter hypodensity. No midline shift, ventriculomegaly, mass effect, evidence of mass lesion, intracranial hemorrhage or evidence of cortically based acute infarction. Vascular: No suspicious intracranial vascular hyperdensity. Skull: Negative. Sinuses/Orbits: Clear. Other: Negative orbit and scalp soft tissues. ASPECTS ([place] Stroke Program Early CT Score) - Ganglionic level infarction (caudate, lentiform nuclei, internal capsule, insula, M1-M3 cortex): 7 - Supraganglionic infarction (M4-M6 cortex): 3 Total score (0-10 with 10 being normal): 10 IMPRESSION: 1. No acute cortically based infarct or acute intracranial hemorrhage. ASPECTS is 10. 2. Evidence of scattered small vessel ischemia, with chronic appearing involvement of the corpus callosum and right basal ganglia. Small age indeterminate area in the left thalamus. 3. The above was relayed via text pager to Dr. S.  on 02/25/2017 at 13:09 . Electronically Signed   By: H  Hall M.D.   On: 02/25/2017 13:09    ROS Blood pressure (!) 127/105, pulse 79, temperature 98.2 F (36.8 C), resp. rate 14, weight 104 kg (229 lb 4.5 oz), last menstrual period 01/31/2017, SpO2 99 %. Neurologic Examination:  Awake, alert. Fluent, but comprehension intermittently impaired.  Naming and repetition are intact. Disoriented to year, month, date, day of week, but knows city, state, president.   Mild right lower facial droop. Decrease pinprick sensation in the right face, arm, and leg. Strength appears relatively intact bilaterally. Coordination is off.  She gets confused with commands easily and points to her stomach repeatedly when asked to point to her nose.   No babinski.  No hoffman's.    Assessment/Plan:  Acute left thalamic ischemic infarct.  She was not an ideal candidate for IV tPA for several reasons.  The time of onset was extremely vague and I suspect that this started yesterday and simply  extended earlier today.  The fact that we see the stroke on CT also confirms this suspicion.  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  Her BP has been very high and resistant to multiple doses of Labetalol putting her at higher risk of hemorrhage.  I think that the risk of IV tPA outweighed its benefit and that with time the encephalopathy and paresthesias could improve naturally.    I recommend admission to Internal medicine service.  Start ASA 325 mg qd.  Check TTE although this particular stroke is small vessel branch related.  Allow permissive hypertension to SBP 220 or DBP 120 for the first 24 hours and then slowly reduce the BP over the next few days.  Check fasting lipid panel and start statin low dose therapy.  Check fasting glucose and HgA1c and treat if diabetic.  Give speech therapy, occupational therapy, and physical therapy.      , , MD 02/25/2017, 3:57 PM  

## 2017-02-25 NOTE — ED Notes (Signed)
Pt in MRI [id number]. RN present for MRI, VS monitored.

## 2017-02-25 NOTE — ED Notes (Signed)
Pts mother arrived to bedside

## 2017-02-25 NOTE — ED Notes (Addendum)
Neurologist, Rapid Response RN, Pharmacy at bedside, trying to determine next steps and get a good time frame.

## 2017-02-25 NOTE — Progress Notes (Signed)
Pt received from the ED via stretcher with no noted distress. Pt denied pain. Complaint of numbness and tingling to right upper extremity. Telemetry applied. Pt oriented to room. Safety measures in place. Call bell within reach. Will continue to monitor.

## 2017-02-25 NOTE — ED Provider Notes (Signed)
[facility]-EMERGENCY DEPT Provider Note   CSN: [id number] Arrival date & time: 02/25/17  1251   An emergency department physician performed an initial assessment on this suspected stroke patient at 1252.  History   Chief Complaint Chief Complaint  Patient presents with  . Code Stroke    HPI Sharon Chan is a 41 y.o. female.  HPI Patient presents to the emergency department as a code stroke with new onset right-sided weakness.  She states she was last normal approximately 8:00 this morning and then laid on the bed and then when she attempted to get up and around 11 to take the trash out she found herself weak on the right side.  No prior history of stroke.  She does have a history of sickle cell disease.  She was also noted to be extremely hypertensive for EMS with a blood pressure 220/130.  No complaints of headache at this time.  No speech difficulty.  She reports feeling numb and weak on her right side.  Symptoms are severe in severity.  Patient was evaluated on arrival to the emergency department taken immediate impact a head CT alongside the stroke team   Past Medical History:  Diagnosis Date  . Polycystic disease, ovaries   . Sickle cell disease (HCC)     There are no active problems to display for this patient.   No past surgical history on file.  OB History    No data available       Home Medications    Prior to Admission medications   Medication Sig Start Date End Date Taking? Authorizing Provider  Cyanocobalamin (VITAMIN B-12 PO) Take 2 tablets by mouth daily.   Yes Historical Provider, MD  MAGNESIUM PO Take 1 tablet by mouth at bedtime.   Yes Historical Provider, MD    Family History No family history on file.  Social History Social History  Substance Use Topics  . Smoking status: Never Smoker  . Smokeless tobacco: Never Used  . Alcohol use No     Allergies   Patient has no known allergies.   Review of Systems Review of Systems  All other  systems reviewed and are negative.    Physical Exam Updated Vital Signs BP (!) 179/115 (BP Location: Right Arm)   Pulse 79   Temp 98.2 F (36.8 C) (Oral)   Resp 16   Wt 229 lb 4.5 oz (104 kg)   LMP 01/31/2017   SpO2 98%   Physical Exam  Constitutional: She is oriented to person, place, and time. She appears well-developed and well-nourished. No distress.  HENT:  Head: Normocephalic and atraumatic.  Eyes: EOM are normal.  Neck: Normal range of motion.  Cardiovascular: Normal rate, regular rhythm and normal heart sounds.   Pulmonary/Chest: Effort normal and breath sounds normal.  Abdominal: Soft. She exhibits no distension. There is no tenderness.  Musculoskeletal: Normal range of motion.  Neurological: She is alert and oriented to person, place, and time.  Skin: Skin is warm and dry.  Psychiatric: She has a normal mood and affect. Judgment normal.  Nursing note and vitals reviewed.    ED Treatments / Results  Labs (all labs ordered are listed, but only abnormal results are displayed) Labs Reviewed  CBC - Abnormal; Notable for the following:       Result Value   RBC 5.17 (*)    MCV 76.2 (*)    MCH 25.3 (*)    All other components within normal limits  COMPREHENSIVE METABOLIC  PANEL - Abnormal; Notable for the following:    Glucose, Bld 134 (*)    Creatinine, Ser 1.08 (*)    Total Protein 6.4 (*)    All other components within normal limits  I-STAT CHEM 8, ED - Abnormal; Notable for the following:    Glucose, Bld 124 (*)    All other components within normal limits  PROTIME-INR  APTT  DIFFERENTIAL  I-STAT TROPOININ, ED  I-STAT BETA HCG BLOOD, ED ([facility], [facility], [facility] ONLY)    EKG  EKG Interpretation  Date/Time:  Saturday February 25 2017 13:32:53 EDT Ventricular Rate:  88 PR Interval:    QRS Duration: 83 QT Interval:  344 QTC Calculation: 417 R Axis:   49 Text Interpretation:  Sinus rhythm No significant change was found Confirmed by Teigan Manner  MD, Caryn Bee ([id number]) on  02/25/2017 3:26:00 PM       Radiology Ct Angio Head W Or Wo Contrast  Addendum Date: 02/25/2017   ADDENDUM REPORT: 02/25/2017 14:30 ADDENDUM: Preliminary report of this exam was discussed by telephone with Dr. Johnn Hai on 02/25/2017 at 1343 hours. Electronically Signed   By: Odessa Fleming M.D.   On: 02/25/2017 14:30   Result Date: 02/25/2017 CLINICAL DATA:  41 year old female with RIGHT side weakness. Confusion. Sickle cell disease. EXAM: CT ANGIOGRAPHY HEAD AND NECK TECHNIQUE: Multidetector CT imaging of the head and neck was performed using the standard protocol during bolus administration of intravenous contrast. Multiplanar CT image reconstructions and MIPs were obtained to evaluate the vascular anatomy. Carotid stenosis measurements (when applicable) are obtained utilizing NASCET criteria, using the distal internal carotid diameter as the denominator. CONTRAST:  100 mL Isovue 370 COMPARISON:  Head CT without contrast 1259 hours today. FINDINGS: CTA NECK Skeleton: No acute osseous abnormality identified. Cervical and thoracic endplates are relatively preserved. Visualized paranasal sinuses and mastoids are stable and well pneumatized. Upper chest: Partially visible cardiomegaly. Negative visualized thoracic aorta. No superior mediastinal lymphadenopathy. Negative visible lung parenchyma except for atelectasis. Other neck: Thyroid remarkable for 18 mm hypodense nodule on the left. Negative larynx, pharynx, parapharyngeal spaces, retropharyngeal space (partially retropharyngeal right carotid), sublingual space, submandibular glands and parotid glands. No cervical lymphadenopathy. Aortic arch: 3 vessel arch configuration, no atherosclerosis. Enhancement of small venous collaterals about the right shoulder. 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 Right carotid system: Mildly tortuous brachiocephalic artery with no stenosis. Negative  proximal right CCA. Partially retropharyngeal right carotid bifurcation. Negative cervical right ICA aside from mild tortuosity. Left carotid system: Negative. Vertebral arteries:No proximal right subclavian stenosis. Non dominant right vertebral artery. Right vertebral artery origin and cervical right vertebral arteries are within normal limits. No proximal left subclavian artery stenosis. Dominant left vertebral artery. No left vertebral origin stenosis. Tortuous left V1 segment. Mildly tortuous left V2 segment, no stenosis to the skullbase. CTA HEAD Posterior circulation: Distal left vertebral artery is dominant. No distal vertebral stenosis. Both PICA origins are patent. No basilar stenosis. Normal SCA origins. Normal left PCA origin. Fetal type right PCA. Left posterior communicating artery is present. Mild right P1 and P2 and moderate to severe left P1 and P2, irregularity and stenosis as seen on series 407, image 52. Preserved bilateral distal PCA flow. Anterior circulation: Both ICA siphons are patent without atherosclerosis or stenosis. Normal ophthalmic and posterior communicating artery origins. Patent carotid termini. However, there is moderate irregularity and stenosis at the right M1 origin (series 403, image  108). The right MCA M1 segment is patent but there is moderate to severe irregularity and stenosis also in the mid M1 (series 403, image 103 and see also series 407, image 52). Despite this the right MCA bifurcation is patent. No right M2 branch occlusion identified. Right MCA M2 and M3 branches are within normal limits. Left MCA origin and proximal M1 segment are normal. There is mild irregularity in the mid left M1 segment. The left MCA bifurcation is patent. There is moderate left M2 and moderate to severe left M3 irregularity and stenosis as seen on series 406, image 32. ACA origins and anterior communicating arteries are within normal limits. Proximal A2 branches are within normal limits but  there is severe left ACA A2 segment stenosis near the callosomarginal artery, and moderate to severe bilateral distal ACA/pericallosal artery irregularity and stenosis (series 406, image 25). Venous sinuses: Patent. Anatomic variants: Dominant left vertebral artery. Fetal type right PCA origin. Review of the MIP images confirms the above findings IMPRESSION: 1. No atherosclerosis or stenosis in the neck, ICA siphons, or basilar artery. 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 Still, there is no emergent large vessel occlusion or target for endovascular intervention identified. 2. Moderate or severe stenoses of the: - Left PCA P1 and P2, - Right MCA origin and M1, - Left ACA A2 and bilateral distal pericallosal arteries, - Left MCA M2 and M3. 3. Left thyroid 18 mm nodule meets consensus criteria for nonemergent follow-up Thyroid Ultrasound characterization. Electronically Signed: By: Odessa Fleming M.D. On: 02/25/2017 13:57   Ct Angio Neck W And/or Wo Contrast  Addendum Date: 02/25/2017   ADDENDUM REPORT: 02/25/2017 14:30 ADDENDUM: Preliminary report of this exam was discussed by telephone with Dr. Johnn Hai on 02/25/2017 at 1343 hours. Electronically Signed   By: Odessa Fleming M.D.   On: 02/25/2017 14:30   Result Date: 02/25/2017 CLINICAL DATA:  41 year old female with RIGHT side weakness. Confusion. Sickle cell disease. EXAM: CT ANGIOGRAPHY HEAD AND NECK TECHNIQUE: Multidetector CT imaging of the head and neck was performed using the standard protocol during bolus administration of intravenous contrast. Multiplanar CT image reconstructions and MIPs were obtained to evaluate the vascular anatomy. Carotid stenosis measurements (when applicable) are obtained utilizing NASCET criteria, using the distal internal carotid diameter as the denominator. CONTRAST:  100 mL Isovue 370 COMPARISON:  Head CT without contrast 1259 hours today. FINDINGS: CTA NECK Skeleton: No acute  osseous abnormality identified. Cervical and thoracic endplates are relatively preserved. Visualized paranasal sinuses and mastoids are stable and well pneumatized. Upper chest: Partially visible cardiomegaly. Negative visualized thoracic aorta. No superior mediastinal lymphadenopathy. Negative visible lung parenchyma except for atelectasis. Other neck: Thyroid remarkable for 18 mm hypodense nodule on the left. Negative larynx, pharynx, parapharyngeal spaces, retropharyngeal space (partially retropharyngeal right carotid), sublingual space, submandibular glands and parotid glands. No cervical lymphadenopathy. Aortic arch: 3 vessel arch configuration, no atherosclerosis. Enhancement of small venous collaterals about the right shoulder. 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 Right carotid system: Mildly tortuous brachiocephalic artery with no stenosis. Negative proximal right CCA. Partially retropharyngeal right carotid bifurcation. Negative cervical right ICA aside from mild tortuosity. Left carotid system: Negative. Vertebral arteries:No proximal right subclavian stenosis. Non dominant right vertebral artery. Right vertebral artery origin and cervical right vertebral arteries are within normal limits. No proximal left subclavian artery stenosis. Dominant left vertebral  artery. No left vertebral origin stenosis. Tortuous left V1 segment. Mildly tortuous left V2 segment, no stenosis to the skullbase. CTA HEAD Posterior circulation: Distal left vertebral artery is dominant. No distal vertebral stenosis. Both PICA origins are patent. No basilar stenosis. Normal SCA origins. Normal left PCA origin. Fetal type right PCA. Left posterior communicating artery is present. Mild right P1 and P2 and moderate to severe left P1 and P2, irregularity and stenosis as seen on series 407, image 52. Preserved bilateral distal PCA flow. Anterior circulation:  Both ICA siphons are patent without atherosclerosis or stenosis. Normal ophthalmic and posterior communicating artery origins. Patent carotid termini. However, there is moderate irregularity and stenosis at the right M1 origin (series 403, image 108). The right MCA M1 segment is patent but there is moderate to severe irregularity and stenosis also in the mid M1 (series 403, image 103 and see also series 407, image 52). Despite this the right MCA bifurcation is patent. No right M2 branch occlusion identified. Right MCA M2 and M3 branches are within normal limits. Left MCA origin and proximal M1 segment are normal. There is mild irregularity in the mid left M1 segment. The left MCA bifurcation is patent. There is moderate left M2 and moderate to severe left M3 irregularity and stenosis as seen on series 406, image 32. ACA origins and anterior communicating arteries are within normal limits. Proximal A2 branches are within normal limits but there is severe left ACA A2 segment stenosis near the callosomarginal artery, and moderate to severe bilateral distal ACA/pericallosal artery irregularity and stenosis (series 406, image 25). Venous sinuses: Patent. Anatomic variants: Dominant left vertebral artery. Fetal type right PCA origin. Review of the MIP images confirms the above findings IMPRESSION: 1. No atherosclerosis or stenosis in the neck, ICA siphons, or basilar artery. 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 Still, there is no emergent large vessel occlusion or target for endovascular intervention identified. 2. Moderate or severe stenoses of the: - Left PCA P1 and P2, - Right MCA origin and M1, - Left ACA A2 and bilateral distal pericallosal arteries, - Left MCA M2 and M3. 3. Left thyroid 18 mm nodule meets consensus criteria for nonemergent follow-up Thyroid Ultrasound characterization. Electronically Signed: By: Odessa Fleming M.D. On: 02/25/2017 13:57   Mr Maxine Glenn  Head Wo Contrast  Result Date: 02/25/2017 CLINICAL DATA:  Sudden onset of right upper and lower extremity weakness and facial droop. Hypertensive crisis. Sickle cell disease. EXAM: MRI HEAD WITHOUT CONTRAST MRA HEAD WITHOUT CONTRAST TECHNIQUE: Multiplanar, multiecho pulse sequences of the brain and surrounding structures were obtained without intravenous contrast. Angiographic images of the head were obtained using MRA technique without contrast. COMPARISON:  CT head without contrast and CTA head and neck from the same day. FINDINGS: MRI HEAD FINDINGS Brain: Diffusion-weighted images demonstrate restricted diffusion within the 80 left lack infarct noted on CT. Areas of T2 shine through or noted in the corona radiata bilaterally. No additional acute infarcts are present. There are remote infarcts involving the right basal ganglia. Scattered subcortical T2 hyperintensities are highly advanced for age. Bilateral T2 hyperintensities are present within the corona radiata. A remote infarct is present in the genu of the left internal capsule. A remote infarct is noted along the left side of the body of the corpus callosum. Vascular: Flow is present in the major intracranial arteries. Skull and upper cervical spine: The skullbase is within normal limits. The craniocervical junction is unremarkable. Midline  sagittal structures are within normal limits. The upper cervical spine is normal. Sinuses/Orbits: The paranasal sinuses and mastoid air cells are clear. The globes and orbits are within normal limits MRA HEAD FINDINGS The internal carotid arteries are within normal limits the high cervical segments through the ICA termini bilaterally. Mild narrowing is again seen at the origins of the right A1 and M1 segments. There is moderate narrowing of the distal right M1 segment. Segmental narrowing present throughout the MCA branch vessels bilaterally. The left vertebral artery is the dominant vessel. PICA origins are visualized  and normal bilaterally. The basilar artery is normal. A moderate stenosis present proximal left posterior cerebral artery. The right posterior cerebral artery is of fetal type. There is cecum at attenuation distal PCA branch vessels bilaterally. IMPRESSION: 1. 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 2. Remote infarcts involving the basal ganglia bilaterally. 3. Scattered white matter changes bilaterally are mildly advanced for age. This likely also reflects the sequela of microvascular ischemia or vasculitis. 4. Remote infarcts involving the body of the corpus callosum. 5. Extensive medium and small vessel disease evident on the MRA as described. Electronically Signed   By: Marin Roberts M.D.   On: 02/25/2017 15:07   Mr Brain Wo Contrast  Result Date: 02/25/2017 CLINICAL DATA:  Sudden onset of right upper and lower extremity weakness and facial droop. Hypertensive crisis. Sickle cell disease. EXAM: MRI HEAD WITHOUT CONTRAST MRA HEAD WITHOUT CONTRAST TECHNIQUE: Multiplanar, multiecho pulse sequences of the brain and surrounding structures were obtained without intravenous contrast. Angiographic images of the head were obtained using MRA technique without contrast. COMPARISON:  CT head without contrast and CTA head and neck from the same day. FINDINGS: MRI HEAD FINDINGS Brain: Diffusion-weighted images demonstrate restricted diffusion within the 80 left lack infarct noted on CT. Areas of T2 shine through or noted in the corona radiata bilaterally. No additional acute infarcts are present. There are remote infarcts involving the right basal ganglia. Scattered subcortical T2 hyperintensities are highly advanced for age. Bilateral T2 hyperintensities are present within the corona radiata. A remote infarct is present in the genu of the left internal capsule. A remote infarct is noted along the left side of the body of the corpus callosum. Vascular: Flow is present in  the major intracranial arteries. Skull and upper cervical spine: The skullbase is within normal limits. The craniocervical junction is unremarkable. Midline sagittal structures are within normal limits. The upper cervical spine is normal. Sinuses/Orbits: The paranasal sinuses and mastoid air cells are clear. The globes and orbits are within normal limits MRA HEAD FINDINGS The internal carotid arteries are within normal limits the high cervical segments through the ICA termini bilaterally. Mild narrowing is again seen at the origins of the right A1 and M1 segments. There is moderate narrowing of the distal right M1 segment. Segmental narrowing present throughout the MCA branch vessels bilaterally. The left vertebral artery is the dominant vessel. PICA origins are visualized and normal bilaterally. The basilar artery is normal. A moderate stenosis present proximal left posterior cerebral artery. The right posterior cerebral artery is of fetal type. There is cecum at attenuation distal PCA branch vessels bilaterally. IMPRESSION: 1. 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 2. Remote infarcts involving the basal ganglia bilaterally. 3. Scattered white matter changes bilaterally are mildly advanced for age. This likely also reflects the sequela of microvascular ischemia or vasculitis. 4. Remote infarcts involving the body of the corpus callosum.  5. Extensive medium and small vessel disease evident on the MRA as described. Electronically Signed   By: Marin Roberts M.D.   On: 02/25/2017 15:07   Ct Head Code Stroke W/o Cm  Result Date: 02/25/2017 CLINICAL DATA:  Code stroke. 41 year old female last seen normal this morning. Left facial droop and left side numbness. Initial encounter. Sickle cell disease EXAM: CT HEAD WITHOUT CONTRAST TECHNIQUE: Contiguous axial images were obtained from the base of the skull through the vertex without intravenous contrast. COMPARISON:   Report of brain MRI 07/11/1999 (no images available). FINDINGS: Brain: Overall normal cerebral volume. Chronic appearing lacunar infarct right basal ganglia. Small age indeterminate hypodensity left thalamus (series 21, image 14). Focal chronic appearing lacunar infarct in the body of the corpus callosum (coronal image 34). Other small areas of cerebral white matter hypodensity. No midline shift, ventriculomegaly, mass effect, evidence of mass lesion, intracranial hemorrhage or evidence of cortically based acute infarction. Vascular: No suspicious intracranial vascular hyperdensity. Skull: Negative. Sinuses/Orbits: Clear. Other: Negative orbit and scalp soft tissues. ASPECTS [facility] Stroke Program Early CT Score) - Ganglionic level infarction (caudate, lentiform nuclei, internal capsule, insula, M1-M3 cortex): 7 - Supraganglionic infarction (M4-M6 cortex): 3 Total score (0-10 with 10 being normal): 10 IMPRESSION: 1. No acute cortically based infarct or acute intracranial hemorrhage. ASPECTS is 10. 2. Evidence of scattered small vessel ischemia, with chronic appearing involvement of the corpus callosum and right basal ganglia. Small age indeterminate area in the left thalamus. 3. The above was relayed via text pager to Dr. Johnn Hai on 02/25/2017 at 13:09 . Electronically Signed   By: Odessa Fleming M.D.   On: 02/25/2017 13:09    ++++++++++++++++++++++++++++++++++++++++++++++++  Procedures .Critical Care Performed by: Azalia Bilis Authorized by: Azalia Bilis    Total critical care time: 33 minutes Critical care time was exclusive of separately billable procedures and treating other patients. Critical care was necessary to treat or prevent imminent or life-threatening deterioration. Critical care was time spent personally by me on the following activities: development of treatment plan with patient and/or surrogate as well as nursing, discussions with consultants, evaluation of patient's response to  treatment, examination of patient, obtaining history from patient or surrogate, ordering and performing treatments and interventions, ordering and review of laboratory studies, ordering and review of radiographic studies, pulse oximetry and re-evaluation of patient's condition.   ++++++++++++++++++++++++++++++++++++++++++++++++++  Medications Ordered in ED Medications  niCARdipine in saline (CARDENE-IV) 20-0.86 MG/200ML-% infusion SOLN (not administered)  iopamidol (ISOVUE-370) 76 % injection (100 mLs  Contrast Given 02/25/17 1308)  labetalol (NORMODYNE,TRANDATE) injection 10 mg (10 mg Intravenous Given 02/25/17 1338)  labetalol (NORMODYNE,TRANDATE) injection 10 mg (10 mg Intravenous Given 02/25/17 1457)     Initial Impression / Assessment and Plan / ED Course  I have reviewed the triage vital signs and the nursing notes.  Pertinent labs & imaging results that were available during my care of the patient were reviewed by me and considered in my medical decision making (see chart for details).    Patient presents with right-sided weakness.  Initially taken back and taken for noncontrasted head CT followed by CTA.  CTA demonstrated no large vessel occlusion.  On MRI patient is found to have left thalamic acute stroke.  Patient is outside the TPA window and is not a candidate for interventional treatment at this time.  Patient be admitted the hospital for ongoing stroke evaluation and workup.  Final Clinical Impressions(s) / ED Diagnoses   Final diagnoses:  Acute ischemic stroke (  [facility])    New Prescriptions New Prescriptions   No medications on file     Azalia Bilis, MD 02/25/17 1525

## 2017-02-25 NOTE — ED Triage Notes (Addendum)
Per ems- pt comes from home this morning at 8 am she was lying in the bed, she got up around 11 am and started doing housework then noticed she had right sided weakness arm and leg and facial droop. BP 230/110, has sickle cell disease. Also has h/a since last night. EMS unable to get IV access.

## 2017-02-25 NOTE — H&P (Signed)
[facility] History and Physical  Sharon Chan ZOX:[id number] DOB: 1976/05/08 DOA: 02/25/2017  Referring physician:  PCP: Sharon Cashing, MD   Chief Complaint: Right-sided weakness  HPI: Sharon Chan is a 41 y.o. female  41 year old female past medical history of PCO S and sickle cell disease disease presents emergency room with chief complaint of right upper and lower extremity weakness. Patient states that at 8 a.m. this morning when she woke up she was perfectly normal. At around 11:00 she had onset of right upper extremity followed by right lower extremity weakness when taking trash. Her mother activated EMS and she was brought to the emergency room for evaluation.  Patient has no history of stroke. Denies any head trauma.  ED course: CT scan was indeterminate initially. Stroke alert call. MRI MRA ordered. Showed left thalamic stroke. Since patient was improving decision was made that patient needed to the hospital service with neurology following.   Review of Systems:  As per HPI otherwise 10 point review of systems negative.    Past Medical History:  Diagnosis Date  . Polycystic disease, ovaries   . Sickle cell disease (HCC)    No past surgical history on file. Social History:  reports that she has never smoked. She has never used smokeless tobacco. She reports that she does not drink alcohol. Her drug history is not on file.  No Known Allergies  No family history on file.   Prior to Admission medications   Medication Sig Start Date End Date Taking? Authorizing Provider  Cyanocobalamin (VITAMIN B-12 PO) Take 2 tablets by mouth daily.   Yes Historical Provider, MD  MAGNESIUM PO Take 1 tablet by mouth at bedtime.   Yes Historical Provider, MD   Physical Exam: Vitals:   02/25/17 1458 02/25/17 1500 02/25/17 1512 02/25/17 1530  BP: (!) 181/112 (!) 200/114 (!) 179/115 (!) 127/105  Pulse: 82 82 79 79  Resp: Temp:      TempSrc:        SpO2: 97% 98% 98% 99%  Weight:        Wt Readings from Last 3 Encounters:  02/25/17 104 kg (229 lb 4.5 oz)    General:  Appears calm and comfortable, Alert and oriented 3 Eyes:  PERRL, EOMI, normal lids, iris ENT:  grossly normal hearing, lips & tongue Neck:  no LAD, masses or thyromegaly Cardiovascular:  RRR, no m/r/g. No LE edema.  Respiratory:  CTA bilaterally, no w/r/r. Normal respiratory effort. Abdomen:  soft, ntnd Skin:  no rash or induration seen on limited exam Musculoskeletal:  grossly normal tone BUE/BLE Psychiatric:  grossly normal mood and affect, speech fluent and appropriate Neurologic:  CN 2-12 grossly intact, moves all extremities in coordinated fashion.Right upper and lower extremely weakness. Normal stereographesthesia.           Labs on Admission:  Basic Metabolic Panel:  Recent Labs Lab 02/25/17 1307 02/25/17 1310  NA 142 140  K 3.6 4.0  CL 106 104  CO2  --  26  GLUCOSE 124* 134*  BUN 13 10  CREATININE 1.00 1.08*  CALCIUM  --  9.2   Liver Function Tests:  Recent Labs Lab 02/25/17 1310  AST 20  ALT 19  ALKPHOS 76  BILITOT 0.3  PROT 6.4*  ALBUMIN 3.6   No results for input(s): LIPASE, AMYLASE in the last 168 hours. No results for input(s): AMMONIA in the last 168 hours. CBC:  Recent Labs Lab 02/25/17 1307 02/25/17  1310  WBC  --  8.0  NEUTROABS  --  3.5  HGB 14.6 13.1  HCT 43.0 39.4  MCV  --  76.2*  PLT  --  297   Cardiac Enzymes: No results for input(s): CKTOTAL, CKMB, CKMBINDEX, TROPONINI in the last 168 hours.  BNP (last 3 results) No results for input(s): BNP in the last 8760 hours.  ProBNP (last 3 results) No results for input(s): PROBNP in the last 8760 hours.   Creatinine clearance cannot be calculated (Unknown ideal weight.)  CBG: No results for input(s): GLUCAP in the last 168 hours.  Radiological Exams on Admission: Ct Angio Head W Or Wo Contrast  Addendum Date: 02/25/2017   ADDENDUM REPORT: 02/25/2017  14:30 ADDENDUM: Preliminary report of this exam was discussed by telephone with Dr. Johnn Hai on 02/25/2017 at 1343 hours. Electronically Signed   By: Odessa Fleming M.D.   On: 02/25/2017 14:30   Result Date: 02/25/2017 CLINICAL DATA:  41 year old female with RIGHT side weakness. Confusion. Sickle cell disease. EXAM: CT ANGIOGRAPHY HEAD AND NECK TECHNIQUE: Multidetector CT imaging of the head and neck was performed using the standard protocol during bolus administration of intravenous contrast. Multiplanar CT image reconstructions and MIPs were obtained to evaluate the vascular anatomy. Carotid stenosis measurements (when applicable) are obtained utilizing NASCET criteria, using the distal internal carotid diameter as the denominator. CONTRAST:  100 mL Isovue 370 COMPARISON:  Head CT without contrast 1259 hours today. FINDINGS: CTA NECK Skeleton: No acute osseous abnormality identified. Cervical and thoracic endplates are relatively preserved. Visualized paranasal sinuses and mastoids are stable and well pneumatized. Upper chest: Partially visible cardiomegaly. Negative visualized thoracic aorta. No superior mediastinal lymphadenopathy. Negative visible lung parenchyma except for atelectasis. Other neck: Thyroid remarkable for 18 mm hypodense nodule on the left. Negative larynx, pharynx, parapharyngeal spaces, retropharyngeal space (partially retropharyngeal right carotid), sublingual space, submandibular glands and parotid glands. No cervical lymphadenopathy. Aortic arch: 3 vessel arch configuration, no atherosclerosis. Enhancement of small venous collaterals about the right shoulder. 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 Right carotid system: Mildly tortuous brachiocephalic artery with no stenosis. Negative proximal right CCA. Partially retropharyngeal right carotid bifurcation. Negative cervical right ICA aside from mild tortuosity.  Left carotid system: Negative. Vertebral arteries:No proximal right subclavian stenosis. Non dominant right vertebral artery. Right vertebral artery origin and cervical right vertebral arteries are within normal limits. No proximal left subclavian artery stenosis. Dominant left vertebral artery. No left vertebral origin stenosis. Tortuous left V1 segment. Mildly tortuous left V2 segment, no stenosis to the skullbase. CTA HEAD Posterior circulation: Distal left vertebral artery is dominant. No distal vertebral stenosis. Both PICA origins are patent. No basilar stenosis. Normal SCA origins. Normal left PCA origin. Fetal type right PCA. Left posterior communicating artery is present. Mild right P1 and P2 and moderate to severe left P1 and P2, irregularity and stenosis as seen on series 407, image 52. Preserved bilateral distal PCA flow. Anterior circulation: Both ICA siphons are patent without atherosclerosis or stenosis. Normal ophthalmic and posterior communicating artery origins. Patent carotid termini. However, there is moderate irregularity and stenosis at the right M1 origin (series 403, image 108). The right MCA M1 segment is patent but there is moderate to severe irregularity and stenosis also in the mid M1 (series 403, image 103 and see also series 407, image 52). Despite this the right MCA bifurcation is patent. No right M2 branch  occlusion identified. Right MCA M2 and M3 branches are within normal limits. Left MCA origin and proximal M1 segment are normal. There is mild irregularity in the mid left M1 segment. The left MCA bifurcation is patent. There is moderate left M2 and moderate to severe left M3 irregularity and stenosis as seen on series 406, image 32. ACA origins and anterior communicating arteries are within normal limits. Proximal A2 branches are within normal limits but there is severe left ACA A2 segment stenosis near the callosomarginal artery, and moderate to severe bilateral distal  ACA/pericallosal artery irregularity and stenosis (series 406, image 25). Venous sinuses: Patent. Anatomic variants: Dominant left vertebral artery. Fetal type right PCA origin. Review of the MIP images confirms the above findings IMPRESSION: 1. No atherosclerosis or stenosis in the neck, ICA siphons, or basilar artery. 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 Still, there is no emergent large vessel occlusion or target for endovascular intervention identified. 2. Moderate or severe stenoses of the: - Left PCA P1 and P2, - Right MCA origin and M1, - Left ACA A2 and bilateral distal pericallosal arteries, - Left MCA M2 and M3. 3. Left thyroid 18 mm nodule meets consensus criteria for nonemergent follow-up Thyroid Ultrasound characterization. Electronically Signed: By: Odessa Fleming M.D. On: 02/25/2017 13:57   Ct Angio Neck W And/or Wo Contrast  Addendum Date: 02/25/2017   ADDENDUM REPORT: 02/25/2017 14:30 ADDENDUM: Preliminary report of this exam was discussed by telephone with Dr. Johnn Hai on 02/25/2017 at 1343 hours. Electronically Signed   By: Odessa Fleming M.D.   On: 02/25/2017 14:30   Result Date: 02/25/2017 CLINICAL DATA:  41 year old female with RIGHT side weakness. Confusion. Sickle cell disease. EXAM: CT ANGIOGRAPHY HEAD AND NECK TECHNIQUE: Multidetector CT imaging of the head and neck was performed using the standard protocol during bolus administration of intravenous contrast. Multiplanar CT image reconstructions and MIPs were obtained to evaluate the vascular anatomy. Carotid stenosis measurements (when applicable) are obtained utilizing NASCET criteria, using the distal internal carotid diameter as the denominator. CONTRAST:  100 mL Isovue 370 COMPARISON:  Head CT without contrast 1259 hours today. FINDINGS: CTA NECK Skeleton: No acute osseous abnormality identified. Cervical and thoracic endplates are relatively preserved. Visualized paranasal sinuses  and mastoids are stable and well pneumatized. Upper chest: Partially visible cardiomegaly. Negative visualized thoracic aorta. No superior mediastinal lymphadenopathy. Negative visible lung parenchyma except for atelectasis. Other neck: Thyroid remarkable for 18 mm hypodense nodule on the left. Negative larynx, pharynx, parapharyngeal spaces, retropharyngeal space (partially retropharyngeal right carotid), sublingual space, submandibular glands and parotid glands. No cervical lymphadenopathy. Aortic arch: 3 vessel arch configuration, no atherosclerosis. Enhancement of small venous collaterals about the right shoulder. 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 Right carotid system: Mildly tortuous brachiocephalic artery with no stenosis. Negative proximal right CCA. Partially retropharyngeal right carotid bifurcation. Negative cervical right ICA aside from mild tortuosity. Left carotid system: Negative. Vertebral arteries:No proximal right subclavian stenosis. Non dominant right vertebral artery. Right vertebral artery origin and cervical right vertebral arteries are within normal limits. No proximal left subclavian artery stenosis. Dominant left vertebral artery. No left vertebral origin stenosis. Tortuous left V1 segment. Mildly tortuous left V2 segment, no stenosis to the skullbase. CTA HEAD Posterior circulation: Distal left vertebral artery is dominant. No distal vertebral stenosis. Both PICA origins are patent. No basilar stenosis. Normal SCA origins.  Normal left PCA origin. Fetal type right PCA. Left posterior communicating artery is present. Mild right P1 and P2 and moderate to severe left P1 and P2, irregularity and stenosis as seen on series 407, image 52. Preserved bilateral distal PCA flow. Anterior circulation: Both ICA siphons are patent without atherosclerosis or stenosis. Normal ophthalmic and posterior communicating artery  origins. Patent carotid termini. However, there is moderate irregularity and stenosis at the right M1 origin (series 403, image 108). The right MCA M1 segment is patent but there is moderate to severe irregularity and stenosis also in the mid M1 (series 403, image 103 and see also series 407, image 52). Despite this the right MCA bifurcation is patent. No right M2 branch occlusion identified. Right MCA M2 and M3 branches are within normal limits. Left MCA origin and proximal M1 segment are normal. There is mild irregularity in the mid left M1 segment. The left MCA bifurcation is patent. There is moderate left M2 and moderate to severe left M3 irregularity and stenosis as seen on series 406, image 32. ACA origins and anterior communicating arteries are within normal limits. Proximal A2 branches are within normal limits but there is severe left ACA A2 segment stenosis near the callosomarginal artery, and moderate to severe bilateral distal ACA/pericallosal artery irregularity and stenosis (series 406, image 25). Venous sinuses: Patent. Anatomic variants: Dominant left vertebral artery. Fetal type right PCA origin. Review of the MIP images confirms the above findings IMPRESSION: 1. No atherosclerosis or stenosis in the neck, ICA siphons, or basilar artery. 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 Still, there is no emergent large vessel occlusion or target for endovascular intervention identified. 2. Moderate or severe stenoses of the: - Left PCA P1 and P2, - Right MCA origin and M1, - Left ACA A2 and bilateral distal pericallosal arteries, - Left MCA M2 and M3. 3. Left thyroid 18 mm nodule meets consensus criteria for nonemergent follow-up Thyroid Ultrasound characterization. Electronically Signed: By: Odessa Fleming M.D. On: 02/25/2017 13:57   Mr Maxine Glenn Head Wo Contrast  Result Date: 02/25/2017 CLINICAL DATA:  Sudden onset of right upper and lower extremity weakness  and facial droop. Hypertensive crisis. Sickle cell disease. EXAM: MRI HEAD WITHOUT CONTRAST MRA HEAD WITHOUT CONTRAST TECHNIQUE: Multiplanar, multiecho pulse sequences of the brain and surrounding structures were obtained without intravenous contrast. Angiographic images of the head were obtained using MRA technique without contrast. COMPARISON:  CT head without contrast and CTA head and neck from the same day. FINDINGS: MRI HEAD FINDINGS Brain: Diffusion-weighted images demonstrate restricted diffusion within the 80 left lack infarct noted on CT. Areas of T2 shine through or noted in the corona radiata bilaterally. No additional acute infarcts are present. There are remote infarcts involving the right basal ganglia. Scattered subcortical T2 hyperintensities are highly advanced for age. Bilateral T2 hyperintensities are present within the corona radiata. A remote infarct is present in the genu of the left internal capsule. A remote infarct is noted along the left side of the body of the corpus callosum. Vascular: Flow is present in the major intracranial arteries. Skull and upper cervical spine: The skullbase is within normal limits. The craniocervical junction is unremarkable. Midline sagittal structures are within normal limits. The upper cervical spine is normal. Sinuses/Orbits: The paranasal sinuses and mastoid air cells are clear. The globes and orbits are within normal limits MRA HEAD FINDINGS The internal carotid arteries are within normal limits the high cervical segments  through the ICA termini bilaterally. Mild narrowing is again seen at the origins of the right A1 and M1 segments. There is moderate narrowing of the distal right M1 segment. Segmental narrowing present throughout the MCA branch vessels bilaterally. The left vertebral artery is the dominant vessel. PICA origins are visualized and normal bilaterally. The basilar artery is normal. A moderate stenosis present proximal left posterior cerebral  artery. The right posterior cerebral artery is of fetal type. There is cecum at attenuation distal PCA branch vessels bilaterally. IMPRESSION: 1. 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 2. Remote infarcts involving the basal ganglia bilaterally. 3. Scattered white matter changes bilaterally are mildly advanced for age. This likely also reflects the sequela of microvascular ischemia or vasculitis. 4. Remote infarcts involving the body of the corpus callosum. 5. Extensive medium and small vessel disease evident on the MRA as described. Electronically Signed   By: Marin Roberts M.D.   On: 02/25/2017 15:07   Mr Brain Wo Contrast  Result Date: 02/25/2017 CLINICAL DATA:  Sudden onset of right upper and lower extremity weakness and facial droop. Hypertensive crisis. Sickle cell disease. EXAM: MRI HEAD WITHOUT CONTRAST MRA HEAD WITHOUT CONTRAST TECHNIQUE: Multiplanar, multiecho pulse sequences of the brain and surrounding structures were obtained without intravenous contrast. Angiographic images of the head were obtained using MRA technique without contrast. COMPARISON:  CT head without contrast and CTA head and neck from the same day. FINDINGS: MRI HEAD FINDINGS Brain: Diffusion-weighted images demonstrate restricted diffusion within the 80 left lack infarct noted on CT. Areas of T2 shine through or noted in the corona radiata bilaterally. No additional acute infarcts are present. There are remote infarcts involving the right basal ganglia. Scattered subcortical T2 hyperintensities are highly advanced for age. Bilateral T2 hyperintensities are present within the corona radiata. A remote infarct is present in the genu of the left internal capsule. A remote infarct is noted along the left side of the body of the corpus callosum. Vascular: Flow is present in the major intracranial arteries. Skull and upper cervical spine: The skullbase is within normal limits. The  craniocervical junction is unremarkable. Midline sagittal structures are within normal limits. The upper cervical spine is normal. Sinuses/Orbits: The paranasal sinuses and mastoid air cells are clear. The globes and orbits are within normal limits MRA HEAD FINDINGS The internal carotid arteries are within normal limits the high cervical segments through the ICA termini bilaterally. Mild narrowing is again seen at the origins of the right A1 and M1 segments. There is moderate narrowing of the distal right M1 segment. Segmental narrowing present throughout the MCA branch vessels bilaterally. The left vertebral artery is the dominant vessel. PICA origins are visualized and normal bilaterally. The basilar artery is normal. A moderate stenosis present proximal left posterior cerebral artery. The right posterior cerebral artery is of fetal type. There is cecum at attenuation distal PCA branch vessels bilaterally. IMPRESSION: 1. 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 2. Remote infarcts involving the basal ganglia bilaterally. 3. Scattered white matter changes bilaterally are mildly advanced for age. This likely also reflects the sequela of microvascular ischemia or vasculitis. 4. Remote infarcts involving the body of the corpus callosum. 5. Extensive medium and small vessel disease evident on the MRA as described. Electronically Signed   By: Marin Roberts M.D.   On: 02/25/2017 15:07   Ct Head Code Stroke W/o Cm  Result Date: 02/25/2017 CLINICAL DATA:  Code stroke. 41 year old  female last seen normal this morning. Left facial droop and left side numbness. Initial encounter. Sickle cell disease EXAM: CT HEAD WITHOUT CONTRAST TECHNIQUE: Contiguous axial images were obtained from the base of the skull through the vertex without intravenous contrast. COMPARISON:  Report of brain MRI 07/11/1999 (no images available). FINDINGS: Brain: Overall normal cerebral volume. Chronic  appearing lacunar infarct right basal ganglia. Small age indeterminate hypodensity left thalamus (series 21, image 14). Focal chronic appearing lacunar infarct in the body of the corpus callosum (coronal image 34). Other small areas of cerebral white matter hypodensity. No midline shift, ventriculomegaly, mass effect, evidence of mass lesion, intracranial hemorrhage or evidence of cortically based acute infarction. Vascular: No suspicious intracranial vascular hyperdensity. Skull: Negative. Sinuses/Orbits: Clear. Other: Negative orbit and scalp soft tissues. ASPECTS [facility] Stroke Program Early CT Score) - Ganglionic level infarction (caudate, lentiform nuclei, internal capsule, insula, M1-M3 cortex): 7 - Supraganglionic infarction (M4-M6 cortex): 3 Total score (0-10 with 10 being normal): 10 IMPRESSION: 1. No acute cortically based infarct or acute intracranial hemorrhage. ASPECTS is 10. 2. Evidence of scattered small vessel ischemia, with chronic appearing involvement of the corpus callosum and right basal ganglia. Small age indeterminate area in the left thalamus. 3. The above was relayed via text pager to Dr. Johnn Hai on 02/25/2017 at 13:09 . Electronically Signed   By: Odessa Fleming M.D.   On: 02/25/2017 13:09    EKG: Independently reviewed. no stemi  Assessment/Plan Principal Problem:   Stroke (cerebrum) (HCC) Active Problems:   Sickle cell disease (HCC)   Stroke CT head showed multiple abnormalities and indeterminate area and left thalamus MRIMRA ordered and showed left thalamic stroke, MRI showed multiple remote strokes Aspirin full dose given  And daily A1c ordered Lipid panel ordered Admitted to telemetry bed Echo ordered Carotid Dopplers ordered Stroke team to follow patient  Sickle cell disease Continue hydration EDP spoke to hematology on call who agreed with hospital service being primary  Code Status: Full code DVT Prophylaxis: Heparin Family Communication: mom at  bedside Disposition Plan: Pending Improvement  Status: Inpatient telemetry  Haydee Salter, MD Family Medicine [facility] [email or web address] Password [email or web address]

## 2017-02-25 NOTE — ED Notes (Signed)
Neurology wants patient to go to MRI, they reports she can come in 10 minutes. Neurology wants RN to go with patient.

## 2017-02-25 NOTE — ED Notes (Signed)
Neurology reports neuro checks every 2 hours. She can sit up. Will not do anything further for BP.

## 2017-02-26 ENCOUNTER — Encounter (HOSPITAL_COMMUNITY): Payer: Self-pay

## 2017-02-26 ENCOUNTER — Inpatient Hospital Stay (HOSPITAL_COMMUNITY): Payer: Medicare Other

## 2017-02-26 DIAGNOSIS — I6789 Other cerebrovascular disease: Secondary | ICD-10-CM

## 2017-02-26 LAB — ECHOCARDIOGRAM COMPLETE
CHL CUP DOP CALC LVOT VTI: 18.2 cm
CHL CUP MV DEC (S): 208
EERAT: 10.58
EWDT: 208 ms
FS: 26 % — AB (ref 28–44)
Height: 63 in
IV/PV OW: 0.9
LA diam index: 1.67 cm/m2
LA vol: 30.7 mL
LASIZE: 34 mm
LAVOLA4C: 32.9 mL
LAVOLIN: 15 mL/m2
LEFT ATRIUM END SYS DIAM: 34 mm
LV E/e'average: 10.58
LV TDI E'LATERAL: 7.08
LV e' LATERAL: 7.08 cm/s
LVEEMED: 10.58
LVOT SV: 57 mL
LVOT area: 3.14 cm2
LVOTD: 20 mm
LVOTPV: 96.7 cm/s
Lateral S' vel: 12.2 cm/s
MV Peak grad: 2 mmHg
MV pk A vel: 68.4 m/s
MV pk E vel: 74.9 m/s
PW: 10.7 mm — AB (ref 0.6–1.1)
TAPSE: 24.3 mm
TDI e' medial: 7.7
Weight: 3625.6 oz

## 2017-02-26 LAB — LIPID PANEL
Cholesterol: 199 mg/dL (ref 0–200)
HDL: 37 mg/dL — ABNORMAL LOW (ref >40)
LDL Cholesterol: 135 mg/dL — ABNORMAL HIGH (ref 0–99)
Total CHOL/HDL Ratio: 5.4 RATIO
Triglycerides: 134 mg/dL (ref <150)
VLDL: 27 mg/dL (ref 0–40)

## 2017-02-26 LAB — HIV ANTIBODY (ROUTINE TESTING W REFLEX): HIV SCREEN 4TH GENERATION: NONREACTIVE

## 2017-02-26 MED ORDER — ATORVASTATIN CALCIUM 40 MG PO TABS
40.0000 mg | ORAL_TABLET | Freq: Every day | ORAL | Status: DC
Start: 2017-02-26 — End: 2017-03-02
  Administered 2017-02-26 – 2017-03-01 (×4): 40 mg via ORAL
  Filled 2017-02-26 (×5): qty 1

## 2017-02-26 MED ORDER — HYDROMORPHONE HCL 1 MG/ML IJ SOLN
2.0000 mg | INTRAMUSCULAR | Status: DC | PRN
Start: 2017-02-26 — End: 2017-03-01
  Administered 2017-02-26 – 2017-03-01 (×9): 2 mg via INTRAVENOUS
  Filled 2017-02-26 (×9): qty 2

## 2017-02-26 MED ORDER — ONDANSETRON HCL 4 MG/2ML IJ SOLN
4.0000 mg | Freq: Four times a day (QID) | INTRAMUSCULAR | Status: DC | PRN
  Administered 2017-02-26 – 2017-02-27 (×4): 4 mg via INTRAVENOUS
  Filled 2017-02-26 (×4): qty 2

## 2017-02-26 NOTE — Progress Notes (Addendum)
Rehab Admissions Coordinator Note:  Patient was screened by Clois Dupes for appropriateness for an Inpatient Acute Rehab Consult per therapy recommendations. At this time, we are recommending Inpatient Rehab consult. Please place order.  Clois Dupes 02/26/2017, 9:17 AM  I can be reached at [phone].

## 2017-02-26 NOTE — Progress Notes (Signed)
Pt complained of nausea after Dilaudid administered. Text paged Md for Zofran.

## 2017-02-26 NOTE — Progress Notes (Signed)
Subjective: Patient is a 41 yo female with history of Sickle cell beta Thalassemia who has not been following up with her physician and has not been on Hydrea or any medications for a while. 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 She has been seen and evaluated by neurology and has started recovery. She has pain at 7/10 in her hips and legs. Denies any SOB or cough.   Objective: Vital signs in last 24 hours: Temp:  [98.2 F (36.8 C)-99 F (37.2 C)] 99 F (37.2 C) (04/15 0200) Pulse Rate:  [71-84] 75 (04/15 0600) Resp:  [11-23] 18 (04/15 0600) BP: (108-200)/(80-118) 133/94 (04/15 0600) SpO2:  [96 %-100 %] 96 % (04/15 0600) Weight:  [102.8 kg (226 lb 9.6 oz)-104 kg (229 lb 4.5 oz)] 102.8 kg (226 lb 9.6 oz) (04/14 1759) Weight change:  Last BM Date: 02/24/17  Intake/Output from previous day: 04/14 0701 - 04/15 0700 In: 1000 [I.V.:1000] Out: -  Intake/Output this shift: No intake/output data recorded.  General appearance: alert, cooperative, appears stated age and no distress Head: Normocephalic, without obvious abnormality, atraumatic Neck: no adenopathy, no carotid bruit, no JVD, supple, symmetrical, trachea midline and thyroid not enlarged, symmetric, no tenderness/mass/nodules Resp: clear to auscultation bilaterally Chest wall: no tenderness Cardio: regular rate and rhythm, S1, S2 normal, no murmur, click, rub or gallop GI: soft, non-tender; bowel sounds normal; no masses,  no organomegaly Extremities: extremities normal, atraumatic, no cyanosis or edema Pulses: 2+ and symmetric Skin: Skin color, texture, turgor normal. No rashes or lesions Neurologic: Mental status: Alert, oriented, thought content appropriate Cranial nerves: normal Motor: right hemiparesis. UE power 3/5 lower extremity power 4/5 poor sensation Coordination: finger to nose abnormal bilaterally  Lab Results:  Recent Labs  02/25/17 1310 02/25/17 1834  WBC  8.0 8.3  HGB 13.1 13.3  HCT 39.4 39.8  PLT 297 303   BMET  Recent Labs  02/25/17 1307 02/25/17 1310 02/25/17 1834  NA 142 140  --   K 3.6 4.0  --   CL 106 104  --   CO2  --  26  --   GLUCOSE 124* 134*  --   BUN 13 10  --   CREATININE 1.00 1.08* 0.91  CALCIUM  --  9.2  --     Studies/Results: Ct Angio Head W Or Wo Contrast  Addendum Date: 02/25/2017   ADDENDUM REPORT: 02/25/2017 14:30 ADDENDUM: Preliminary report of this exam was discussed by telephone with Dr. Johnn Hai on 02/25/2017 at 1343 hours. Electronically Signed   By: Odessa Fleming M.D.   On: 02/25/2017 14:30   Result Date: 02/25/2017 CLINICAL DATA:  41 year old female with RIGHT side weakness. Confusion. Sickle cell disease. EXAM: CT ANGIOGRAPHY HEAD AND NECK TECHNIQUE: Multidetector CT imaging of the head and neck was performed using the standard protocol during bolus administration of intravenous contrast. Multiplanar CT image reconstructions and MIPs were obtained to evaluate the vascular anatomy. Carotid stenosis measurements (when applicable) are obtained utilizing NASCET criteria, using the distal internal carotid diameter as the denominator. CONTRAST:  100 mL Isovue 370 COMPARISON:  Head CT without contrast 1259 hours today. FINDINGS: CTA NECK Skeleton: No acute osseous abnormality identified. Cervical and thoracic endplates are relatively preserved. Visualized paranasal sinuses and mastoids are stable and well pneumatized. Upper chest: Partially visible cardiomegaly. Negative visualized thoracic aorta. No superior mediastinal lymphadenopathy. Negative visible lung parenchyma except for atelectasis. Other neck: Thyroid remarkable for 18 mm hypodense nodule on  the left. Negative larynx, pharynx, parapharyngeal spaces, retropharyngeal space (partially retropharyngeal right carotid), sublingual space, submandibular glands and parotid glands. No cervical lymphadenopathy. Aortic arch: 3 vessel arch configuration, no atherosclerosis.  Enhancement of small venous collaterals about the right shoulder. 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 Right carotid system: Mildly tortuous brachiocephalic artery with no stenosis. Negative proximal right CCA. Partially retropharyngeal right carotid bifurcation. Negative cervical right ICA aside from mild tortuosity. Left carotid system: Negative. Vertebral arteries:No proximal right subclavian stenosis. Non dominant right vertebral artery. Right vertebral artery origin and cervical right vertebral arteries are within normal limits. No proximal left subclavian artery stenosis. Dominant left vertebral artery. No left vertebral origin stenosis. Tortuous left V1 segment. Mildly tortuous left V2 segment, no stenosis to the skullbase. CTA HEAD Posterior circulation: Distal left vertebral artery is dominant. No distal vertebral stenosis. Both PICA origins are patent. No basilar stenosis. Normal SCA origins. Normal left PCA origin. Fetal type right PCA. Left posterior communicating artery is present. Mild right P1 and P2 and moderate to severe left P1 and P2, irregularity and stenosis as seen on series 407, image 52. Preserved bilateral distal PCA flow. Anterior circulation: Both ICA siphons are patent without atherosclerosis or stenosis. Normal ophthalmic and posterior communicating artery origins. Patent carotid termini. However, there is moderate irregularity and stenosis at the right M1 origin (series 403, image 108). The right MCA M1 segment is patent but there is moderate to severe irregularity and stenosis also in the mid M1 (series 403, image 103 and see also series 407, image 52). Despite this the right MCA bifurcation is patent. No right M2 branch occlusion identified. Right MCA M2 and M3 branches are within normal limits. Left MCA origin and proximal M1 segment are normal. There is mild irregularity in the mid left M1 segment.  The left MCA bifurcation is patent. There is moderate left M2 and moderate to severe left M3 irregularity and stenosis as seen on series 406, image 32. ACA origins and anterior communicating arteries are within normal limits. Proximal A2 branches are within normal limits but there is severe left ACA A2 segment stenosis near the callosomarginal artery, and moderate to severe bilateral distal ACA/pericallosal artery irregularity and stenosis (series 406, image 25). Venous sinuses: Patent. Anatomic variants: Dominant left vertebral artery. Fetal type right PCA origin. Review of the MIP images confirms the above findings IMPRESSION: 1. No atherosclerosis or stenosis in the neck, ICA siphons, or basilar artery. 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 Still, there is no emergent large vessel occlusion or target for endovascular intervention identified. 2. Moderate or severe stenoses of the: - Left PCA P1 and P2, - Right MCA origin and M1, - Left ACA A2 and bilateral distal pericallosal arteries, - Left MCA M2 and M3. 3. Left thyroid 18 mm nodule meets consensus criteria for nonemergent follow-up Thyroid Ultrasound characterization. Electronically Signed: By: Odessa Fleming M.D. On: 02/25/2017 13:57   Dg Chest 2 View  Result Date: 02/25/2017 CLINICAL DATA:  Right side numbness, facial droop, history of sickle cell disease EXAM: CHEST  2 VIEW COMPARISON:  05/07/2006 FINDINGS: Borderline cardiomegaly. Right IJ Port-A-Cath with tip in upper SVC is unchanged in position. No acute infiltrate or pulmonary edema. Mild left basilar atelectasis. Mild degenerative changes mid thoracic spine. IMPRESSION: No active cardiopulmonary disease.  Mild left basilar atelectasis. Electronically Signed   By:  Natasha Mead M.D.   On: 02/25/2017 17:19   Ct Angio Neck W And/or Wo Contrast  Addendum Date: 02/25/2017   ADDENDUM REPORT: 02/25/2017 14:30 ADDENDUM: Preliminary report of this exam  was discussed by telephone with Dr. Johnn Hai on 02/25/2017 at 1343 hours. Electronically Signed   By: Odessa Fleming M.D.   On: 02/25/2017 14:30   Result Date: 02/25/2017 CLINICAL DATA:  41 year old female with RIGHT side weakness. Confusion. Sickle cell disease. EXAM: CT ANGIOGRAPHY HEAD AND NECK TECHNIQUE: Multidetector CT imaging of the head and neck was performed using the standard protocol during bolus administration of intravenous contrast. Multiplanar CT image reconstructions and MIPs were obtained to evaluate the vascular anatomy. Carotid stenosis measurements (when applicable) are obtained utilizing NASCET criteria, using the distal internal carotid diameter as the denominator. CONTRAST:  100 mL Isovue 370 COMPARISON:  Head CT without contrast 1259 hours today. FINDINGS: CTA NECK Skeleton: No acute osseous abnormality identified. Cervical and thoracic endplates are relatively preserved. Visualized paranasal sinuses and mastoids are stable and well pneumatized. Upper chest: Partially visible cardiomegaly. Negative visualized thoracic aorta. No superior mediastinal lymphadenopathy. Negative visible lung parenchyma except for atelectasis. Other neck: Thyroid remarkable for 18 mm hypodense nodule on the left. Negative larynx, pharynx, parapharyngeal spaces, retropharyngeal space (partially retropharyngeal right carotid), sublingual space, submandibular glands and parotid glands. No cervical lymphadenopathy. Aortic arch: 3 vessel arch configuration, no atherosclerosis. Enhancement of small venous collaterals about the right shoulder. 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 Right carotid system: Mildly tortuous brachiocephalic artery with no stenosis. Negative proximal right CCA. Partially retropharyngeal right carotid bifurcation. Negative cervical right ICA aside from mild tortuosity. Left carotid system: Negative. Vertebral  arteries:No proximal right subclavian stenosis. Non dominant right vertebral artery. Right vertebral artery origin and cervical right vertebral arteries are within normal limits. No proximal left subclavian artery stenosis. Dominant left vertebral artery. No left vertebral origin stenosis. Tortuous left V1 segment. Mildly tortuous left V2 segment, no stenosis to the skullbase. CTA HEAD Posterior circulation: Distal left vertebral artery is dominant. No distal vertebral stenosis. Both PICA origins are patent. No basilar stenosis. Normal SCA origins. Normal left PCA origin. Fetal type right PCA. Left posterior communicating artery is present. Mild right P1 and P2 and moderate to severe left P1 and P2, irregularity and stenosis as seen on series 407, image 52. Preserved bilateral distal PCA flow. Anterior circulation: Both ICA siphons are patent without atherosclerosis or stenosis. Normal ophthalmic and posterior communicating artery origins. Patent carotid termini. However, there is moderate irregularity and stenosis at the right M1 origin (series 403, image 108). The right MCA M1 segment is patent but there is moderate to severe irregularity and stenosis also in the mid M1 (series 403, image 103 and see also series 407, image 52). Despite this the right MCA bifurcation is patent. No right M2 branch occlusion identified. Right MCA M2 and M3 branches are within normal limits. Left MCA origin and proximal M1 segment are normal. There is mild irregularity in the mid left M1 segment. The left MCA bifurcation is patent. There is moderate left M2 and moderate to severe left M3 irregularity and stenosis as seen on series 406, image 32. ACA origins and anterior communicating arteries are within normal limits. Proximal A2 branches are within normal limits but there is severe left ACA A2 segment stenosis near the callosomarginal artery, and moderate to severe bilateral distal ACA/pericallosal artery  irregularity and stenosis  (series 406, image 25). Venous sinuses: Patent. Anatomic variants: Dominant left vertebral artery. Fetal type right PCA origin. Review of the MIP images confirms the above findings IMPRESSION: 1. No atherosclerosis or stenosis in the neck, ICA siphons, or basilar artery. 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 Still, there is no emergent large vessel occlusion or target for endovascular intervention identified. 2. Moderate or severe stenoses of the: - Left PCA P1 and P2, - Right MCA origin and M1, - Left ACA A2 and bilateral distal pericallosal arteries, - Left MCA M2 and M3. 3. Left thyroid 18 mm nodule meets consensus criteria for nonemergent follow-up Thyroid Ultrasound characterization. Electronically Signed: By: Odessa Fleming M.D. On: 02/25/2017 13:57   Mr Maxine Glenn Head Wo Contrast  Result Date: 02/25/2017 CLINICAL DATA:  Sudden onset of right upper and lower extremity weakness and facial droop. Hypertensive crisis. Sickle cell disease. EXAM: MRI HEAD WITHOUT CONTRAST MRA HEAD WITHOUT CONTRAST TECHNIQUE: Multiplanar, multiecho pulse sequences of the brain and surrounding structures were obtained without intravenous contrast. Angiographic images of the head were obtained using MRA technique without contrast. COMPARISON:  CT head without contrast and CTA head and neck from the same day. FINDINGS: MRI HEAD FINDINGS Brain: Diffusion-weighted images demonstrate restricted diffusion within the 80 left lack infarct noted on CT. Areas of T2 shine through or noted in the corona radiata bilaterally. No additional acute infarcts are present. There are remote infarcts involving the right basal ganglia. Scattered subcortical T2 hyperintensities are highly advanced for age. Bilateral T2 hyperintensities are present within the corona radiata. A remote infarct is present in the genu of the left internal capsule. A remote infarct is noted along the left side of the body of the  corpus callosum. Vascular: Flow is present in the major intracranial arteries. Skull and upper cervical spine: The skullbase is within normal limits. The craniocervical junction is unremarkable. Midline sagittal structures are within normal limits. The upper cervical spine is normal. Sinuses/Orbits: The paranasal sinuses and mastoid air cells are clear. The globes and orbits are within normal limits MRA HEAD FINDINGS The internal carotid arteries are within normal limits the high cervical segments through the ICA termini bilaterally. Mild narrowing is again seen at the origins of the right A1 and M1 segments. There is moderate narrowing of the distal right M1 segment. Segmental narrowing present throughout the MCA branch vessels bilaterally. The left vertebral artery is the dominant vessel. PICA origins are visualized and normal bilaterally. The basilar artery is normal. A moderate stenosis present proximal left posterior cerebral artery. The right posterior cerebral artery is of fetal type. There is cecum at attenuation distal PCA branch vessels bilaterally. IMPRESSION: 1. 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 2. Remote infarcts involving the basal ganglia bilaterally. 3. Scattered white matter changes bilaterally are mildly advanced for age. This likely also reflects the sequela of microvascular ischemia or vasculitis. 4. Remote infarcts involving the body of the corpus callosum. 5. Extensive medium and small vessel disease evident on the MRA as described. Electronically Signed   By: Marin Roberts M.D.   On: 02/25/2017 15:07   Mr Brain Wo Contrast  Result Date: 02/25/2017 CLINICAL DATA:  Sudden onset of right upper and lower extremity weakness and facial droop. Hypertensive crisis. Sickle cell disease. EXAM: MRI HEAD WITHOUT CONTRAST MRA HEAD WITHOUT CONTRAST TECHNIQUE: Multiplanar, multiecho pulse sequences of the brain and surrounding structures were  obtained without intravenous contrast. Angiographic images of the head were obtained using MRA technique without contrast. COMPARISON:  CT head without contrast and CTA head and neck from the same day. FINDINGS: MRI HEAD FINDINGS Brain: Diffusion-weighted images demonstrate restricted diffusion within the 80 left lack infarct noted on CT. Areas of T2 shine through or noted in the corona radiata bilaterally. No additional acute infarcts are present. There are remote infarcts involving the right basal ganglia. Scattered subcortical T2 hyperintensities are highly advanced for age. Bilateral T2 hyperintensities are present within the corona radiata. A remote infarct is present in the genu of the left internal capsule. A remote infarct is noted along the left side of the body of the corpus callosum. Vascular: Flow is present in the major intracranial arteries. Skull and upper cervical spine: The skullbase is within normal limits. The craniocervical junction is unremarkable. Midline sagittal structures are within normal limits. The upper cervical spine is normal. Sinuses/Orbits: The paranasal sinuses and mastoid air cells are clear. The globes and orbits are within normal limits MRA HEAD FINDINGS The internal carotid arteries are within normal limits the high cervical segments through the ICA termini bilaterally. Mild narrowing is again seen at the origins of the right A1 and M1 segments. There is moderate narrowing of the distal right M1 segment. Segmental narrowing present throughout the MCA branch vessels bilaterally. The left vertebral artery is the dominant vessel. PICA origins are visualized and normal bilaterally. The basilar artery is normal. A moderate stenosis present proximal left posterior cerebral artery. The right posterior cerebral artery is of fetal type. There is cecum at attenuation distal PCA branch vessels bilaterally. IMPRESSION: 1. Acute nonhemorrhagic infarcts involving the left thalamus measures 9  mm maximally. 2. Remote infarcts involving the basal ganglia bilaterally. 3. Scattered white matter changes bilaterally are mildly advanced for age. This likely also reflects the sequela of microvascular ischemia or vasculitis. 4. Remote infarcts involving the body of the corpus callosum. 5. Extensive medium and small vessel disease evident on the MRA as described. Electronically Signed   By: Marin Roberts M.D.   On: 02/25/2017 15:07   Ct Head Code Stroke W/o Cm  Result Date: 02/25/2017 CLINICAL DATA:  Code stroke. 41 year old female last seen normal this morning. Left facial droop and left side numbness. Initial encounter. Sickle cell disease EXAM: CT HEAD WITHOUT CONTRAST TECHNIQUE: Contiguous axial images were obtained from the base of the skull through the vertex without intravenous contrast. COMPARISON:  Report of brain MRI 07/11/1999 (no images available). FINDINGS: Brain: Overall normal cerebral volume. Chronic appearing lacunar infarct right basal ganglia. Small age indeterminate hypodensity left thalamus (series 21, image 14). Focal chronic appearing lacunar infarct in the body of the corpus callosum (coronal image 34). Other small areas of cerebral white matter hypodensity. No midline shift, ventriculomegaly, mass effect, evidence of mass lesion, intracranial hemorrhage or evidence of cortically based acute infarction. Vascular: No suspicious intracranial vascular hyperdensity. Skull: Negative. Sinuses/Orbits: Clear. Other: Negative orbit and scalp soft tissues. ASPECTS [facility] Stroke Program Early CT Score) - Ganglionic level infarction (caudate, lentiform nuclei, internal capsule, insula, M1-M3 cortex): 7 - Supraganglionic infarction (M4-M6 cortex): 3 Total score (0-10 with 10 being normal): 10 IMPRESSION: 1. No acute cortically based infarct or acute intracranial hemorrhage. ASPECTS is 10. 2. Evidence of scattered small vessel ischemia, with chronic appearing involvement of the corpus  callosum and right basal ganglia. Small age indeterminate area in the left thalamus. 3. The above was relayed via text pager to Dr.  S. Eshraghi on 02/25/2017 at 13:09 . Electronically Signed   By: Odessa Fleming M.D.   On: 02/25/2017 13:09    Medications: I have reviewed the patient's current medications.  Assessment/Plan: A 41 yo woman admitted with acute CVA as well as Sickle cell disease.  1. Acute CVA: Patient recovering. Will continue with Echocardiogram and other work up per Neurology. PT/OT consult, ASA, Statin.  2. Sickle Cell disease: No evidence of acute crisis. paion managemnt will be with IV dilaudid PRN until oral intake cleared by speech therapy then oral alternative.  3. HTN: Due to acute CVA, permissive HTN employed.    LOS: 1 day   GARBA,LAWAL 02/26/2017, 8:30 AM

## 2017-02-26 NOTE — Evaluation (Signed)
Occupational Therapy Evaluation [name] MRN: [id number] DOB: August 22, 1976 Today's Date: 02/26/2017    History of Present Illness Pt is a 41 y.o. female who presented to the ED with R LE weakness, R UE weakness and numbness/tingling, and R facial numbness. 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 MRI revealed acute infarct in L thalamus. Pt with PMH significant for polycystic disease (ovaries), and sickle cell disease.   Clinical Impression   PTA, pt was independent with ADL and functional mobility and was active and volunteering in the community. She currently requires mod assist with bimanual grooming tasks and max assist with LB dressing tasks. She presents with decreased R UE strength and sensation, decreased R UE coordination, slight inattention to R side of her body, and blurred vision in R eye impacting her ability to participate in ADL tasks at [facility]. She would benefit from continued OT services while admitted to improve independence with ADL and functional mobility. Recommend CIR placement for continued rehabilitation in order to maximize return to PLOF. Will continue to follow acutely.    Follow Up Recommendations  CIR;Supervision/Assistance - 24 hour    Equipment Recommendations  Other (comment) (TBD at next venue)    Recommendations for Other Services Rehab consult     Precautions / Restrictions Precautions Precautions: Fall Restrictions Weight Bearing Restrictions: No Other Position/Activity Restrictions: R-sided weakness      Mobility Bed Mobility Overal bed mobility: Needs Assistance Bed Mobility: Supine to Sit     Supine to sit: Min assist     General bed mobility comments: Min assist with VC's to attend to R side of body.  Transfers Overall transfer level: Needs assistance Equipment used: 1 person hand held assist;Rolling walker (2 wheeled) Transfers: Sit to/from Stand Sit to Stand: Min  assist         General transfer comment: Min assist with B UE support during sit<>stand required. Unable to stand without L UE support for balance.    Balance Overall balance assessment: Needs assistance Sitting-balance support: No upper extremity supported;Feet supported Sitting balance-Leahy Scale: Fair     Standing balance support: Bilateral upper extremity supported;Single extremity supported;During functional activity Standing balance-Leahy Scale: Poor Standing balance comment: Reliant on  UE support to maintain balance.                           ADL either performed or assessed with clinical judgement   ADL Overall ADL's : Needs assistance/impaired Eating/Feeding: Minimal assistance;Sitting   Grooming: Sitting;Moderate assistance;Oral care;Wash/dry face   Upper Body Bathing: Moderate assistance;Sitting   Lower Body Bathing: Sit to/from stand;Maximal assistance   Upper Body Dressing : Moderate assistance;Sitting   Lower Body Dressing: Maximal assistance;Sit to/from stand   Toilet Transfer: Minimal assistance;RW;Ambulation Statistician Details (indicate cue type and reason): Did take a few steps from bed to chair. Unable to control RW with R UE but benefits from L UE support. Toileting- Clothing Manipulation and Hygiene: Moderate assistance;Sit to/from stand       Functional mobility during ADLs: Minimal assistance General ADL Comments: Pt unable to control RW with R UE this session but benefits from L UE support during ambulation. Reports slight dizziness on standing.     Vision Baseline Vision/History: No visual deficits Patient Visual Report: Blurring of vision Vision Assessment?: Yes Eye Alignment: Within Functional Limits Ocular Range of Motion: Within Functional Limits Alignment/Gaze Preference: Within Defined Limits Tracking/Visual Pursuits:  Decreased smoothness of horizontal tracking;Decreased smoothness of eye movement to LEFT superior  field;Decreased smoothness of eye movement to RIGHT superior field Visual Fields: Other (comment) Additional Comments: Pt reporting blurred vision in R eye which resolves when R eye closed. She demonstrated difficulty tracking to R and L superior fields with greatest difficulty to the R. With confrontation testing, fields appear in tact but pt reporting "floaters" in R visual field. Will continue to assess.     Perception Perception Perception Tested?: Yes Perception Deficits: Inattention/neglect Inattention/Neglect: Does not attend to right side of body Comments: Difficulty attending to R side of her body requiring verbal cues.   Praxis      Pertinent Vitals/Pain Pain Assessment: 0-10 Pain Score: 8  Pain Location: headache, hips, back Pain Descriptors / Indicators: Aching;Headache Pain Intervention(s): Monitored during session     Hand Dominance Left   Extremity/Trunk Assessment Upper Extremity Assessment Upper Extremity Assessment: RUE deficits/detail RUE Deficits / Details: Numbness/tingling and decreased light touch throughout. Pt with 3/5 strength in R shoulder flexion, 3-/5 R elbow flexion/extension, and 3+/5 grasp strength. Dysmetria with finger to nose and unable to isolate R digits. RUE Sensation: decreased light touch;decreased proprioception RUE Coordination: decreased fine motor   Lower Extremity Assessment Lower Extremity Assessment: Defer to PT evaluation       Communication Communication Communication: No difficulties   Cognition Arousal/Alertness: Awake/alert Behavior During Therapy: WFL for tasks assessed/performed Overall Cognitive Status: Impaired/Different from baseline Area of Impairment: Problem solving                             Problem Solving: Slow processing;Difficulty sequencing General Comments: Difficulty with higher level cognitive tasks this session.   General Comments       Exercises     Shoulder Instructions      Home  Living Family/patient expects to be discharged to:: Private residence Living Arrangements: Alone Available Help at Discharge: Family;Available PRN/intermittently Type of Home: Apartment Home Access: Level entry     Home Layout: One level     Bathroom Shower/Tub: Tub/shower unit;Curtain   Firefighter: Standard     Home Equipment: Environmental consultant - 2 wheels          Prior Functioning/Environment Level of Independence: Independent        Comments: Drives        OT Problem List: Decreased strength;Decreased range of motion;Decreased activity tolerance;Impaired balance (sitting and/or standing);Impaired vision/perception;Decreased coordination;Decreased cognition;Decreased safety awareness;Decreased knowledge of use of DME or AE;Decreased knowledge of precautions;Impaired UE functional use;Pain      OT Treatment/Interventions: Self-care/ADL training;Therapeutic exercise;DME and/or AE instruction;Therapeutic activities;Cognitive remediation/compensation;Neuromuscular education;Energy conservation;Visual/perceptual remediation/compensation;Patient/family education;Balance training    OT Goals(Current goals can be found in the care plan section) Acute Rehab OT Goals Patient Stated Goal: to get back to normal OT Goal Formulation: With patient Time For Goal Achievement: 03/12/17 Potential to Achieve Goals: Good ADL Goals Pt Will Perform Eating: with modified independence;with adaptive utensils Pt Will Perform Grooming: with supervision;sitting;with adaptive equipment (bimanual tasks) Pt Will Perform Upper Body Dressing: with modified independence;sitting Pt Will Perform Lower Body Dressing: with supervision;sit to/from stand Pt Will Transfer to Toilet: ambulating;bedside commode;with modified independence Pt/caregiver will Perform Home Exercise Program: Right Upper extremity;Increased strength;Increased ROM;With written HEP provided;Independently Additional ADL Goal #1: Pt will  demonstrate improved problem solving skills to complete simple financial management tasks to improve independence with IADL tasks.  OT Frequency: Min 2X/week   Barriers to D/C:  Co-evaluation              End of Session Equipment Utilized During Treatment: Gait belt;Rolling walker Nurse Communication: Mobility status  Activity Tolerance: Patient tolerated treatment well Patient left: in chair;with call bell/phone within reach;with chair alarm set  OT Visit Diagnosis: Hemiplegia and hemiparesis Hemiplegia - Right/Left: Right Hemiplegia - dominant/non-dominant: Non-Dominant Hemiplegia - caused by: Cerebral infarction                Time: 0805-0830 OT Time Calculation (min): 25 min Charges:  OT General Charges $OT Visit: 1 Procedure OT Evaluation $OT Eval Moderate Complexity: 1 Procedure OT Treatments $Self Care/Home Management : 8-22 mins G-Codes:     Doristine Section, MS OTR/L  Pager: [phone]   Audie Stayer A Female Minish 02/26/2017, 8:55 AM

## 2017-02-26 NOTE — Evaluation (Signed)
Clinical/Bedside Swallow Evaluation Patient Details  Name: Sharon Chan MRN: [id number] Date of Birth: [birth year]  Today's Date: 02/26/2017 Time: SLP Start Time (ACUTE ONLY): 0825 SLP Stop Time (ACUTE ONLY): 0839 SLP Time Calculation (min) (ACUTE ONLY): 14 min  Past Medical History:  Past Medical History:  Diagnosis Date  . Polycystic disease, ovaries   . Sickle cell disease (HCC)    Past Surgical History: History reviewed. No pertinent surgical history. HPI:  Pt is a 41 y.o female with PMH of sickle cell disease who presented 4/14 with right sided numbness, tingling. 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 There was a hypodensity in the left thalamus of unclear age. CTA Brain showed multiple intracranial stenosis which could be [place] disease related vs other differentials such as CNS vasculitis. CTA Neck is normal. According to MD notes, pt vague historian and her neurological was bizarre in presentation as it was associated with confusion and encephalopathy. MRI Brain showed an acute infarct in the left thalamus.MRA showed similar beading of intracranial vessels. Pt failed stroke swallow screen and was referred for swallowing evaluation. No prior swallowing evaluations in chart.    Assessment / Plan / Recommendation Clinical Impression  Patient presents with mild-moderate risk for aspiration given right sided sensory deficits including facial, lingual numbness. She is alert, oriented x4 and responding appropriately to speech, following all basic commands. Speech in intelligible, however pt states it "feels funny" when she talks, complains of her tongue feeling "heavy," reduced sensation right facial, labial, lingual. Manages 3 oz water swallow challenge with no overt signs of aspiration and with adequate oral control. Oral control and clearance of pureed solids adequate, no overt signs of aspiration. Noted prolonged mastication and bolus  formation, with reduced labial seal and near bolus loss from right side with regular solids, and subtle immediate throat clear. Recommend initiating dys 2 diet with thin liquids given sensory deficits. Medications whole in puree. SLP will f/u for tolerance and advancement of solids. Pt may benefit from cognitive-linguistic evaluation. SLP Visit Diagnosis: Dysphagia, oral phase (R13.11)    Aspiration Risk  Mild aspiration risk    Diet Recommendation Dysphagia 2 (Fine chop);Thin liquid   Liquid Administration via: Cup;Straw Medication Administration: Whole meds with puree Supervision: Patient able to self feed;Intermittent supervision to cue for compensatory strategies Compensations: Slow rate;Small sips/bites;Lingual sweep for clearance of pocketing Postural Changes: Seated upright at 90 degrees    Other  Recommendations Oral Care Recommendations: Oral care BID   Follow up Recommendations Other (comment) (TBA)      Frequency and Duration min 2x/week  2 weeks       Prognosis Prognosis for Safe Diet Advancement: Good      Swallow Study   General Date of Onset: 02/25/17 HPI: Pt is a 41 y.o female with PMH of sickle cell disease who presented 4/14 with right sided numbness, tingling. 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 There was a hypodensity in the left thalamus of unclear age. CTA Brain showed multiple intracranial stenosis which could be [place] disease related vs other differentials such as CNS vasculitis. CTA Neck is normal. According to MD notes, pt vague historian and her neurological was bizarre in presentation as it was associated with confusion and encephalopathy. MRI Brain showed an acute infarct in the left thalamus.MRA showed similar beading of intracranial vessels. Pt failed stroke swallow screen and was referred for swallowing evaluation. No prior  swallowing evaluations in chart.  Type of Study: Bedside Swallow  Evaluation Previous Swallow Assessment: none in chart Diet Prior to this Study: NPO Temperature Spikes Noted: No Respiratory Status: Room air History of Recent Intubation: No Behavior/Cognition: Alert;Cooperative;Pleasant mood Oral Cavity Assessment: Within Functional Limits Oral Care Completed by SLP: Yes Oral Cavity - Dentition: Dentures, top;Dentures, bottom Vision: Functional for self-feeding Self-Feeding Abilities: Able to feed self Patient Positioning: Upright in chair Baseline Vocal Quality: Normal Volitional Cough: Strong Volitional Swallow: Able to elicit    Oral/Motor/Sensory Function Overall Oral Motor/Sensory Function: Moderate impairment Facial ROM: Within Functional Limits Facial Symmetry: Within Functional Limits Facial Strength: Within Functional Limits Facial Sensation: Suspected CN V (Trigeminal) dysfunction;Reduced right Lingual ROM: Within Functional Limits Lingual Symmetry: Within Functional Limits Lingual Strength: Reduced Lingual Sensation: Reduced Velum: Within Functional Limits Mandible: Within Functional Limits   Ice Chips Ice chips: Within functional limits   Thin Liquid Thin Liquid: Within functional limits Presentation: Cup;Self Fed;Straw    Nectar Thick Nectar Thick Liquid: Not tested   Honey Thick Honey Thick Liquid: Not tested   Puree Puree: Within functional limits Presentation: Self Fed;Spoon   Solid   GO   Solid: Impaired Presentation: Self Fed Oral Phase Impairments: Reduced labial seal;Poor awareness of bolus;Impaired mastication Pharyngeal Phase Impairments: Throat Clearing - Immediate        [name] 02/26/2017,8:57 AM Rondel Baton, MS CF-SLP Speech-Language Pathologist [phone]

## 2017-02-26 NOTE — Evaluation (Signed)
Physical Therapy Evaluation [name] MRN: [id number] DOB: [birth year] Today's Date: 02/26/2017   History of Present Illness  Pt is a 41 y.o. female who presented to the ED with R LE weakness, R UE weakness and numbness/tingling, and R facial numbness. 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 MRI revealed acute infarct in L thalamus. Pt with PMH significant for polycystic disease (ovaries), and sickle cell disease.  Clinical Impression  Pt presents to PT today with decreased tolerance for functional mobility due to R sided weakness, decreased endurance, and decreased stability (see PT Problem list).  She required +2 min assist to stand and +1 min assist to ambulate 60 feet with RW and close chair follow.  PTA, she was independent with all mobility and lives alone.  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  She wants to work with rehab and will benefit from therapy at the CIR level to maximize return to PLOF. Will continue to follow acutely      Follow Up Recommendations CIR    Equipment Recommendations  None recommended by PT (TBD by next venue of care)    Recommendations for Other Services Rehab consult     Precautions / Restrictions Precautions Precautions: Fall Restrictions Weight Bearing Restrictions: No Other Position/Activity Restrictions: R-sided weakness      Mobility  Bed Mobility Overal bed mobility: Needs Assistance Bed Mobility: Supine to Sit     Supine to sit: Min assist     General bed mobility comments: Pt OOB in chair at start of session  Transfers Overall transfer level: Needs assistance Equipment used: 2 person hand held assist;Rolling walker (2 wheeled) Transfers: Sit to/from Stand Sit to Stand: Min assist;+2 physical assistance         General transfer comment: Min assist with B UE support during sit<>stand required. Unable to stand without L UE  support for balance.  VC's for hand placement.  Extra time required to position R hand.  Ambulation/Gait Ambulation/Gait assistance: +2 physical assistance;Min assist;+2 safety/equipment Ambulation Distance (Feet): 70 Feet Assistive device: 2 person hand held assist;Rolling walker (2 wheeled) Gait Pattern/deviations: Step-to pattern;Decreased step length - right;Trunk flexed;Wide base of support;Step-through pattern Gait velocity: decreased Gait velocity interpretation: Below normal speed for age/gender General Gait Details: Pt initially required +2 hand held assist at waist level for leverage and VC's for step to pattern.  Pt able to push through R arm so after 10 ft switched to RW with assistance/instruction to grip walker with R hand.  Pt completed ambulation with walker and step through sequence, requiring +1 min assist and chair follow. Pt reliant on RW for balance and stability.  Stairs            Wheelchair Mobility    Modified Rankin (Stroke Patients Only) Modified Rankin (Stroke Patients Only) Pre-Morbid Rankin Score: No symptoms Modified Rankin: Moderately severe disability     Balance Overall balance assessment: Needs assistance Sitting-balance support: No upper extremity supported;Feet supported Sitting balance-Leahy Scale: Fair     Standing balance support: Bilateral upper extremity supported Standing balance-Leahy Scale: Poor Standing balance comment: Reliant on  UE support to maintain balance.                             Pertinent Vitals/Pain Pain Assessment: Faces Pain Score: 8  Faces Pain Scale: Hurts a little bit Pain Location: headache, hips,  back Pain Descriptors / Indicators: Aching;Headache Pain Intervention(s): Monitored during session    Home Living Family/patient expects to be discharged to:: Private residence Living Arrangements: Alone Available Help at Discharge: Family;Available 24 hours/day Type of Home: Apartment Home  Access: Level entry     Home Layout: One level Home Equipment: Walker - 2 wheels      Prior Function Level of Independence: Independent         Comments: Drives     Hand Dominance   Dominant Hand: Left (Left to write but right for other tasks)    Extremity/Trunk Assessment   Upper Extremity Assessment Upper Extremity Assessment: RUE deficits/detail RUE Deficits / Details: Numbness/tingling and decreased light touch throughout. Pt with 3/5 strength in R shoulder flexion, 3-/5 R elbow flexion/extension, and 3+/5 grasp strength. Dysmetria with finger to nose and unable to isolate R digits. RUE Sensation: decreased light touch;decreased proprioception RUE Coordination: decreased fine motor    Lower Extremity Assessment Lower Extremity Assessment: RLE deficits/detail RLE Deficits / Details: 3/5 strength for hip flexion, knee flexion and extension.  2/5 strengh dorsiflexion/plantarflexion.  Decreased light touch and coordination with heel to shin slides. RLE Sensation: decreased light touch;decreased proprioception RLE Coordination: decreased gross motor    Cervical / Trunk Assessment Cervical / Trunk Assessment:  (forward head, rounded shoulders)  Communication   Communication: No difficulties  Cognition Arousal/Alertness: Awake/alert Behavior During Therapy: WFL for tasks assessed/performed Overall Cognitive Status: Within Functional Limits for tasks assessed Area of Impairment: Problem solving                             Problem Solving: Slow processing;Difficulty sequencing General Comments: Difficulty with higher level cognitive tasks this session.      General Comments General comments (skin integrity, edema, etc.): Pt reports feeling frightened from her R sided deficits with MMT, sensation and coordination testing, but stated that she saw improvement since previous night.    Exercises     Assessment/Plan    PT Assessment Patient needs continued  PT services  PT Problem List Decreased strength;Decreased range of motion;Decreased activity tolerance;Decreased balance;Decreased mobility;Decreased coordination;Decreased knowledge of use of DME;Decreased safety awareness;Impaired sensation;Pain       PT Treatment Interventions DME instruction;Gait training;Functional mobility training;Therapeutic activities;Therapeutic exercise;Balance training;Neuromuscular re-education;Patient/family education    PT Goals (Current goals can be found in the Care Plan section)  Acute Rehab PT Goals Patient Stated Goal: to get back to normal PT Goal Formulation: With patient Time For Goal Achievement: 03/12/17 Potential to Achieve Goals: Good    Frequency Min 4X/week   Barriers to discharge        Co-evaluation               End of Session Equipment Utilized During Treatment: Gait belt Activity Tolerance: Patient tolerated treatment well Patient left: in chair;with call bell/phone within reach Nurse Communication: Mobility status PT Visit Diagnosis: Hemiplegia and hemiparesis;Pain;Muscle weakness (generalized) (M62.81);Unsteadiness on feet (R26.81) Hemiplegia - Right/Left: Right Hemiplegia - dominant/non-dominant: Non-dominant Hemiplegia - caused by: Cerebral infarction Pain - part of body: Hip (head)    Time: 1610-9604 PT Time Calculation (min) (ACUTE ONLY): 35 min   Charges:   PT Evaluation $PT Eval Moderate Complexity: 1 Procedure PT Treatments $Gait Training: 8-22 mins   PT G Codes:        Willaim Rayas SPT  Willaim Rayas 02/26/2017, 11:55 AM

## 2017-02-26 NOTE — Progress Notes (Signed)
  Echocardiogram 2D Echocardiogram has been performed.  Sharon Chan 02/26/2017, 4:21 PM

## 2017-02-26 NOTE — Progress Notes (Signed)
STROKE TEAM PROGRESS NOTE   HISTORY OF PRESENT ILLNESS (per record) Sharon Chan is a 41 y.o. female who states that yesterday she had some numbness in the right leg around the knee causing her leg to buckle.  She thought it was because she was walking too much and ignored it.  This morning she woke up at 8 am feeling OK overall except for some possible mild right face and hand numbness. She got out of bed at 11 am and then noticed that she has significant right sided numbness and tingling.  Upon arrival to the ER she had very high BP.  No prior history of hypertension.  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  There was a hypodensity in the left thalamus of unclear age.  CTA Brain showed multiple intracranial stenosis which could be [place] disease related vs other differentials such as CNS vasculitis.  CTA Neck is normal.  She was a vague historian and her neurological was bizarre in presentation as it was associated with confusion and encephalopathy.  Therefore, I did a stat MRI Brain which showed an acute infarct in the left thalamus.  MRA showed similar beading of intracranial vessels. She was not taking any medications at home, including no antiplatelets.  She had not had any major sickle cell crisis for many years.    She has no known hyperlipidemia, hypertension, diabetes, and she is not a smoker.  She denies illicit drug use.   Patient received total of 30 mg IV Labetolol but her BP remained outside of the parameters for IV tPA.     SUBJECTIVE (INTERVAL HISTORY) Her mother is at the bedside.  She states her speech has improved still persists. She has a history of sickle cell disease but has not had any crisis in the last 7 years  OBJECTIVE Temp:  [98.2 F (36.8 C)-99 F (37.2 C)] 98.3 F (36.8 C) (04/15 0830) Pulse Rate:  [71-84] 78 (04/15 0830) Cardiac Rhythm: Normal sinus rhythm (04/15 0754) Resp:  [11-23] 18 (04/15 0830) BP:  (108-200)/(80-118) 151/91 (04/15 0830) SpO2:  [96 %-100 %] 100 % (04/15 0830) Weight:  [102.8 kg (226 lb 9.6 oz)-104 kg (229 lb 4.5 oz)] 102.8 kg (226 lb 9.6 oz) (04/14 1759)  CBC:   Recent Labs Lab 02/25/17 1310 02/25/17 1834  WBC 8.0 8.3  NEUTROABS 3.5  --   HGB 13.1 13.3  HCT 39.4 39.8  MCV 76.2* 75.2*  PLT 297 303    Basic Metabolic Panel:   Recent Labs Lab 02/25/17 1307 02/25/17 1310 02/25/17 1834  NA 142 140  --   K 3.6 4.0  --   CL 106 104  --   CO2  --  26  --   GLUCOSE 124* 134*  --   BUN 13 10  --   CREATININE 1.00 1.08* 0.91  CALCIUM  --  9.2  --   MG  --   --  2.0    Lipid Panel:     Component Value Date/Time   CHOL 199 02/26/2017 0341   TRIG 134 02/26/2017 0341   HDL 37 (L) 02/26/2017 0341   CHOLHDL 5.4 02/26/2017 0341   VLDL 27 02/26/2017 0341   LDLCALC 135 (H) 02/26/2017 0341   HgbA1c: No results found for: HGBA1C Urine Drug Screen: No results found for: LABOPIA, COCAINSCRNUR, LABBENZ, AMPHETMU, THCU, LABBARB  Alcohol Level No results found for: ETH  IMAGING  Ct Angio Head W  Or Wo Contrast Ct Angio Neck W And/or Wo Contrast 02/25/2017 1. No atherosclerosis or stenosis in the neck, ICA siphons, or basilar artery. 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 Still, there is no emergent large vessel occlusion or target for endovascular intervention identified.  2. Moderate or severe stenoses of the: - Left PCA P1 and P2, - Right MCA origin and M1, - Left ACA A2 and bilateral distal pericallosal arteries, - Left MCA M2 and M3.  3. Left thyroid 18 mm nodule meets consensus criteria for nonemergent follow-up   Thyroid Ultrasound characterization.    Dg Chest 2 View 02/25/2017 No active cardiopulmonary disease.  Mild left basilar atelectasis.    Mr Maxine Glenn Head Wo Contrast 02/25/2017 1. 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  2. Remote infarcts  involving the basal ganglia bilaterally.  3. Scattered white matter changes bilaterally are mildly advanced for age. This likely also reflects the sequela of microvascular ischemia or vasculitis.  4. Remote infarcts involving the body of the corpus callosum.  5. Extensive medium and small vessel disease evident on the MRA as described.    Ct Head Code Stroke W/o Cm 02/25/2017 1. No acute cortically based infarct or acute intracranial hemorrhage. ASPECTS is 10.  2. Evidence of scattered small vessel ischemia, with chronic appearing involvement of the corpus callosum and right basal ganglia. Small age indeterminate area in the left thalamus.     PHYSICAL EXAM Young obese african [place] lady not in distress. . Afebrile. Head is nontraumatic. Neck is supple without bruit.    Cardiac exam no murmur or gallop. Lungs are clear to auscultation. Distal pulses are well felt.  Neurological Exam :  Awake alert oriented x3. Normal speech and language. No dysarthria or aphasia. Fundi not visualized. Extraocular movements are full range without nystagmus. Blinks to threat bilaterally. Vision acuity and fields appear norma Right lower facial weakness. Tongue midline. Motor exam shows mild right hemiparesis with right upper extremity drift. Weakness of right grip and intrinsic hand muscles Fine finger movements are diminished on right. Orbits left over right upper extremity. Diminished right hemibody sensation including lower face.gait deferred. Coordination diminished on right. Rt Plantar is upgoing and left is downgoing.     ASSESSMENT/PLAN Sharon Chan is a 41 y.o. female with history of sickle cell disease and polycystic disease of the ovaries presenting with right sided numbness and weakness in the setting of severe hypertension 230/110. She did not receive IV t-PA due to uncontrolled hypertension and unclear time of initial onset.    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 Multiple remote infarcts.  MRA - Extensive medium and small vessel disease evident on the MRA.  CTA H&N - widespread cerebrovascular disease.  Carotid Doppler - CTA neck  2D Echo - pending  LDL - 135  UDS - pending  [id number] pending  VTE prophylaxis - subcutaneous heparin DIET DYS 2 Room service appropriate? Yes; Fluid consistency: Thin  No antithrombotic prior to admission, now on aspirin 325 mg daily  Patient counseled to be compliant with her antithrombotic medications  Ongoing aggressive stroke risk factor management  Therapy recommendations:  CIR recommended. MD consult ordered.  Disposition: Pending  Hypertension  Stable - 151/91  Permissive hypertension (OK if < 220/120) but  gradually normalize in 5-7 days  Long-term BP goal normotensive  Hyperlipidemia  Home meds: No lipid lowering medications prior to admission  LDL 135, goal < 70  Start Lipitor 40 mg daily ( may need to increase to 80 mg daily )  Continue statin at discharge    Other Stroke Risk Factors  Obesity, Body mass index is 40.14 kg/m., recommend weight loss, diet and exercise as appropriate   Hx stroke/TIA - by imaging    Other Active Problems  Left thyroid 18 mm nodule meets consensus criteria for nonemergent follow-up   May need to consider dual antiplatelet therapy secondary to widespread cerebrovascular disease.  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 Recommend check hemoglobin S levels and a significantly elevated may need exchange transfusion,start aspirin for stroke and had statin. Check transcranial Doppler  study for baseline and to arrange outpatient 6 monthly follow up studies  for screening.gretar than 50 % time during this 35 minute visit was spent on counselling and ccordination of care about her stroke, sickle cell vasculopathy, stroke risk and prevention discussion and answering questions. Delia Heady, MD Medical Director [facility] Stroke Center Pager: [phone] 02/26/2017 5:35 PM To contact Stroke Continuity provider, please refer to [email or web address]. After hours, contact General Neurology

## 2017-02-27 ENCOUNTER — Inpatient Hospital Stay (HOSPITAL_COMMUNITY): Payer: Medicare Other

## 2017-02-27 DIAGNOSIS — I63311 Cerebral infarction due to thrombosis of right middle cerebral artery: Secondary | ICD-10-CM

## 2017-02-27 DIAGNOSIS — D5700 Hb-SS disease with crisis, unspecified: Secondary | ICD-10-CM

## 2017-02-27 DIAGNOSIS — I69391 Dysphagia following cerebral infarction: Secondary | ICD-10-CM

## 2017-02-27 DIAGNOSIS — I639 Cerebral infarction, unspecified: Principal | ICD-10-CM

## 2017-02-27 DIAGNOSIS — G8191 Hemiplegia, unspecified affecting right dominant side: Secondary | ICD-10-CM

## 2017-02-27 DIAGNOSIS — E282 Polycystic ovarian syndrome: Secondary | ICD-10-CM

## 2017-02-27 DIAGNOSIS — R51 Headache: Secondary | ICD-10-CM

## 2017-02-27 DIAGNOSIS — D72829 Elevated white blood cell count, unspecified: Secondary | ICD-10-CM

## 2017-02-27 DIAGNOSIS — R519 Headache, unspecified: Secondary | ICD-10-CM

## 2017-02-27 DIAGNOSIS — Q613 Polycystic kidney, unspecified: Secondary | ICD-10-CM

## 2017-02-27 DIAGNOSIS — R269 Unspecified abnormalities of gait and mobility: Secondary | ICD-10-CM

## 2017-02-27 DIAGNOSIS — R7303 Prediabetes: Secondary | ICD-10-CM

## 2017-02-27 DIAGNOSIS — I10 Essential (primary) hypertension: Secondary | ICD-10-CM

## 2017-02-27 LAB — CBC WITH DIFFERENTIAL/PLATELET
BASOS ABS: 0 10*3/uL (ref 0.0–0.1)
BASOS PCT: 0 %
EOS ABS: 0 10*3/uL (ref 0.0–0.7)
EOS PCT: 0 %
HCT: 39.2 % (ref 36.0–46.0)
HEMOGLOBIN: 12.9 g/dL (ref 12.0–15.0)
Lymphocytes Relative: 18 %
Lymphs Abs: 2.3 10*3/uL (ref 0.7–4.0)
MCH: 25 pg — AB (ref 26.0–34.0)
MCHC: 32.9 g/dL (ref 30.0–36.0)
MCV: 76.1 fL — ABNORMAL LOW (ref 78.0–100.0)
Monocytes Absolute: 0.7 10*3/uL (ref 0.1–1.0)
Monocytes Relative: 5 %
NEUTROS PCT: 77 %
Neutro Abs: 9.5 10*3/uL — ABNORMAL HIGH (ref 1.7–7.7)
PLATELETS: 287 10*3/uL (ref 150–400)
RBC: 5.15 MIL/uL — AB (ref 3.87–5.11)
RDW: 15.4 % (ref 11.5–15.5)
WBC: 12.5 10*3/uL — AB (ref 4.0–10.5)

## 2017-02-27 LAB — BASIC METABOLIC PANEL
ANION GAP: 10 (ref 5–15)
BUN: 16 mg/dL (ref 6–20)
CALCIUM: 9.3 mg/dL (ref 8.9–10.3)
CHLORIDE: 101 mmol/L (ref 101–111)
CO2: 26 mmol/L (ref 22–32)
Creatinine, Ser: 0.91 mg/dL (ref 0.44–1.00)
GLUCOSE: 153 mg/dL — AB (ref 65–99)
POTASSIUM: 3.7 mmol/L (ref 3.5–5.1)
SODIUM: 137 mmol/L (ref 135–145)

## 2017-02-27 LAB — TECHNOLOGIST SMEAR REVIEW

## 2017-02-27 LAB — HEMOGLOBIN A1C
Hgb A1c MFr Bld: 6.3 % — ABNORMAL HIGH (ref 4.8–5.6)
MEAN PLASMA GLUCOSE: 134 mg/dL

## 2017-02-27 LAB — RETICULOCYTES
RBC.: 5.15 MIL/uL — ABNORMAL HIGH (ref 3.87–5.11)
Retic Count, Absolute: 56.7 10*3/uL (ref 19.0–186.0)
Retic Ct Pct: 1.1 % (ref 0.4–3.1)

## 2017-02-27 LAB — RAPID URINE DRUG SCREEN, HOSP PERFORMED
AMPHETAMINES: NOT DETECTED
BARBITURATES: NOT DETECTED
BENZODIAZEPINES: NOT DETECTED
Cocaine: NOT DETECTED
Opiates: POSITIVE — AB
TETRAHYDROCANNABINOL: NOT DETECTED

## 2017-02-27 LAB — ABO/RH: ABO/RH(D): B POS

## 2017-02-27 LAB — PREPARE RBC (CROSSMATCH)

## 2017-02-27 MED ORDER — SODIUM CHLORIDE 0.9 % IV SOLN: Freq: Once | INTRAVENOUS | Status: AC

## 2017-02-27 MED ORDER — SODIUM CHLORIDE 0.9 % IV SOLN
Freq: Once | INTRAVENOUS | Status: AC
  Administered 2017-02-27: 12:00:00 via INTRAVENOUS

## 2017-02-27 MED ORDER — KETOROLAC TROMETHAMINE 15 MG/ML IJ SOLN
15.0000 mg | Freq: Four times a day (QID) | INTRAMUSCULAR | Status: DC
  Administered 2017-02-27 – 2017-03-01 (×10): 15 mg via INTRAVENOUS
  Filled 2017-02-27 (×10): qty 1

## 2017-02-27 MED ORDER — ACETAMINOPHEN 650 MG RE SUPP: 650.0000 mg | RECTAL | Status: DC | PRN

## 2017-02-27 MED ORDER — ACETAMINOPHEN 500 MG PO TABS: 1000.0000 mg | ORAL_TABLET | Freq: Four times a day (QID) | ORAL | Status: DC | PRN

## 2017-02-27 MED ORDER — ACETAMINOPHEN 160 MG/5ML PO SOLN: 650.0000 mg | ORAL | Status: DC | PRN

## 2017-02-27 NOTE — Progress Notes (Signed)
Transcranial Doppler has been completed.  Farrel Demark, RDMS, RVT 02/27/2017

## 2017-02-27 NOTE — Progress Notes (Signed)
  Speech Language Pathology Treatment: Dysphagia  [name] MRN: [id number] DOB: 09/05/1976 Today's Date: 02/27/2017 Time: 4967-5916 SLP Time Calculation (min) (ACUTE ONLY): 10 min  Assessment / Plan / Recommendation Clinical Impression  Pt reports difficulty with nausea but improvement in swallowing.   Observed pt consuming saltine cracker and ginger-ale - pt uses left labial for closure on straw due to right facial sensory deficits.  Of note, pt reports sensory deficits on right face (upper, middle, lower branches!) and right lingual sensory deficits.  Lingual deviation to left upon protrusion noted.   Mild oral retention of masticated saltine noted - posterior tongue - with pt awareness.  Use of liquid wash facilitated clearance.  As pt is aware of her dysphagia and uses caution with po, recommend to advance her diet to regular/thin.   Educated with pt completed re: dysphagia, compensation strategies.   Pt admits to some delays in expressive language-word finding deficits.   She was fluent during SLP session with no evidence of aphasia/dysarthria = she may benefit from SLP speech/language evaluation if MD indicates.   No follow up with SLP for dysphagia.     HPI HPI: Pt is a 41 y.o female with PMH of sickle cell disease who presented 4/14 with right sided numbness, tingling. 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 There was a hypodensity in the left thalamus of unclear age. CTA Brain showed multiple intracranial stenosis which could be  disease related vs other differentials such as CNS vasculitis. CTA Neck is normal. According to MD notes, pt vague historian and her neurological was bizarre in presentation as it was associated with confusion and encephalopathy. MRI Brain showed an acute infarct in the left thalamus.MRA showed similar beading of intracranial vessels. Pt failed stroke swallow screen and was referred for  swallowing evaluation. No prior swallowing evaluations in chart.       SLP Plan  All goals met       Recommendations  Diet recommendations: Regular;Thin liquid Medication Administration: Whole meds with liquid Supervision: Patient able to self feed Compensations: Slow rate;Small sips/bites;Lingual sweep for clearance of pocketing Postural Changes and/or Swallow Maneuvers: Seated upright 90 degrees;Upright 30-60 min after meal                Oral Care Recommendations: Oral care BID Follow up Recommendations: Other (comment) (TBA) SLP Visit Diagnosis: Dysphagia, oral phase (R13.11) Plan: All goals met       GO                Claudie Fisherman, [facility] SLP [phone]

## 2017-02-27 NOTE — Progress Notes (Signed)
STROKE TEAM PROGRESS NOTE   HISTORY OF PRESENT ILLNESS (per record) Sharon Chan is a 41 y.o. female who states that yesterday she had some numbness in the right leg around the knee causing her leg to buckle.  She thought it was because she was walking too much and ignored it.  This morning she woke up at 8 am feeling OK overall except for some possible mild right face and hand numbness. She got out of bed at 11 am and then noticed that she has significant right sided numbness and tingling.  Upon arrival to the ER she had very high BP.  No prior history of hypertension.  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  There was a hypodensity in the left thalamus of unclear age.  CTA Brain showed multiple intracranial stenosis which could be  disease related vs other differentials such as CNS vasculitis.  CTA Neck is normal.  She was a vague historian and her neurological was bizarre in presentation as it was associated with confusion and encephalopathy.  Therefore, I did a stat MRI Brain which showed an acute infarct in the left thalamus.  MRA showed similar beading of intracranial vessels. She was not taking any medications at home, including no antiplatelets.  She had not had any major sickle cell crisis for many years.    She has no known hyperlipidemia, hypertension, diabetes, and she is not a smoker.  She denies illicit drug use.   Patient received total of 30 mg IV Labetolol but her BP remained outside of the parameters for IV tPA.     SUBJECTIVE (INTERVAL HISTORY) Dr Sharon Chan is at the bedside.  She states She has subjective worsening of her right-sided strength as well as new left hand and arm pain and may be about to go into the sickle cell pain crisis  OBJECTIVE Temp:  [97.5 F (36.4 C)-98.8 F (37.1 C)] 98.7 F (37.1 C) (04/16 1342) Pulse Rate:  [72-96] 96 (04/16 1342) Cardiac Rhythm: Normal sinus rhythm (04/16 0758) Resp:  [18-20] 20  (04/16 1342) BP: (112-137)/(68-107) 130/107 (04/16 1342) SpO2:  [96 %-100 %] 100 % (04/16 1342)  CBC:   Recent Labs Lab 02/25/17 1310 02/25/17 1834 02/27/17 1036  WBC 8.0 8.3 12.5*  NEUTROABS 3.5  --  9.5*  HGB 13.1 13.3 12.9  HCT 39.4 39.8 39.2  MCV 76.2* 75.2* 76.1*  PLT 297 303 287    Basic Metabolic Panel:   Recent Labs Lab 02/25/17 1310 02/25/17 1834 02/27/17 1036  NA 140  --  137  K 4.0  --  3.7  CL 104  --  101  CO2 26  --  26  GLUCOSE 134*  --  153*  BUN 10  --  16  CREATININE 1.08* 0.91 0.91  CALCIUM 9.2  --  9.3  MG  --  2.0  --     Lipid Panel:     Component Value Date/Time   CHOL 199 02/26/2017 0341   TRIG 134 02/26/2017 0341   HDL 37 (L) 02/26/2017 0341   CHOLHDL 5.4 02/26/2017 0341   VLDL 27 02/26/2017 0341   LDLCALC 135 (H) 02/26/2017 0341   HgbA1c:  Lab Results  Component Value Date   HGBA1C 6.3 (H) 02/26/2017   Urine Drug Screen:     Component Value Date/Time   LABOPIA POSITIVE (A) 02/26/2017 0147   COCAINSCRNUR NONE DETECTED 02/26/2017 0147   LABBENZ NONE DETECTED 02/26/2017 0147  AMPHETMU NONE DETECTED 02/26/2017 0147   THCU NONE DETECTED 02/26/2017 0147   LABBARB NONE DETECTED 02/26/2017 0147    Alcohol Level No results found for: ETH  IMAGING  Ct Angio Head W Or Wo Contrast Ct Angio Neck W And/or Wo Contrast 02/25/2017 1. No atherosclerosis or stenosis in the neck, ICA siphons, or basilar artery. 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 Still, there is no emergent large vessel occlusion or target for endovascular intervention identified.  2. Moderate or severe stenoses of the: - Left PCA P1 and P2, - Right MCA origin and M1, - Left ACA A2 and bilateral distal pericallosal arteries, - Left MCA M2 and M3.  3. Left thyroid 18 mm nodule meets consensus criteria for nonemergent follow-up   Thyroid Ultrasound characterization.    Dg Chest 2 View 02/25/2017 No active  cardiopulmonary disease.  Mild left basilar atelectasis.    Mr Sharon Chan Head Wo Contrast 02/25/2017 1. 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  2. Remote infarcts involving the basal ganglia bilaterally.  3. Scattered white matter changes bilaterally are mildly advanced for age. This likely also reflects the sequela of microvascular ischemia or vasculitis.  4. Remote infarcts involving the body of the corpus callosum.  5. Extensive medium and small vessel disease evident on the MRA as described.    Ct Head Code Stroke W/o Cm 02/25/2017 1. No acute cortically based infarct or acute intracranial hemorrhage. ASPECTS is 10.  2. Evidence of scattered small vessel ischemia, with chronic appearing involvement of the corpus callosum and right basal ganglia. Small age indeterminate area in the left thalamus.     PHYSICAL EXAM Young obese african [place] lady not in distress. . Afebrile. Head is nontraumatic. Neck is supple without bruit.    Cardiac exam no murmur or gallop. Lungs are clear to auscultation. Distal pulses are well felt.  Neurological Exam :  Awake alert oriented x3. Normal speech and language. No dysarthria or aphasia. Fundi not visualized. Extraocular movements are full range without nystagmus. Blinks to threat bilaterally. Vision acuity and fields appear norma Right lower facial weakness. Tongue midline. Motor exam shows mild right hemiparesis with mild right upper extremity drift. Weakness of right grip and intrinsic hand muscles Fine finger movements are diminished on right. Orbits left over right upper extremity. Diminished right hemibody sensation including lower face.gait deferred. Coordination diminished on right. Rt Plantar is upgoing and left is downgoing.     ASSESSMENT/PLAN [name] is a 41 y.o. female with history of sickle cell disease and polycystic disease of the ovaries presenting with right sided numbness and  weakness in the setting of severe hypertension 230/110. She did not receive IV t-PA due to uncontrolled hypertension and unclear time of initial onset.    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 Multiple remote infarcts.  MRA - Extensive medium and small vessel disease evident on the MRA.  CTA H&N - widespread cerebrovascular disease.  Carotid Doppler - CTA neck  2D Echo - pending  LDL - 135  UDS - pending  [id number] pending  VTE prophylaxis - subcutaneous heparin Diet regular Room service appropriate? Yes; Fluid consistency: Thin  No antithrombotic prior to admission, now on aspirin 325 mg daily  Patient counseled to be compliant with  her antithrombotic medications  Ongoing aggressive stroke risk factor management  Therapy recommendations:  CIR recommended. MD consult ordered.  Disposition: Pending  Hypertension  Stable - 151/91  Permissive hypertension (OK if < 220/120) but gradually normalize in 5-7 days  Long-term BP goal normotensive  Hyperlipidemia  Home meds: No lipid lowering medications prior to admission  LDL 135, goal < 70   Lipitor 40 mg daily ( may need to increase to 80 mg daily )  Continue statin at discharge    Other Stroke Risk Factors  Obesity, Body mass index is 40.14 kg/m., recommend weight loss, diet and exercise as appropriate   Hx stroke/TIA - by imaging    Other Active Problems  Left thyroid 18 mm nodule meets consensus criteria for nonemergent follow-up   May need to consider dual antiplatelet therapy secondary to widespread cerebrovascular disease.  Hospital day # 2   Hemoglobin S levels and transcranial Doppler studies are pending. Long discussion with patient and Dr. Ashley Chan about treatment. Agree with partial exchange  transfusion. She may need red cell exchange if her transcranial Doppler mean flow velocities are greater than 200. She voiced understanding. gretar than 50 % time during this 35 minute visit was spent on counselling and ccordination of care about her stroke, sickle cell vasculopathy and crisis, stroke risk and prevention discussion and answering questions. Delia Heady, MD Medical Director [facility] Stroke Center Pager: [phone] 02/27/2017 3:20 PM To contact Stroke Continuity provider, please refer to [email or web address]. After hours, contact General Neurology

## 2017-02-27 NOTE — Consult Note (Signed)
Physical Medicine and Rehabilitation Consult Reason for Consult: Decreased functional mobility with right sided weakness Referring Physician: [facility]   HPI: Sharon Chan is a 41 y.o. right handed female with history of polycystic disease and sickle cell disease. Per chart review and patient, patient lives alone and independent prior to admission still driving. One level home. She has a mother in the area who is out of work until June after recent surgery. Presented 02/25/2017 with right sided weakness, headache and elevated blood pressure 230/110. Cranial CT reviewed, negative for acute abnormalities. Per report, Evidence of scattered small vessel ischemia, with chronic appearing involvement of the corpus callosum also a right basal ganglia. Small age indeterminate area in the left thalamus. CT Angio head and neck showed moderate severe stenosis of the left PCA P1 and P2, right MCA origin and M1, left ACA A2 and bilateral distal pericallosal arteries, left MCA M2 and M3. No atherosclerosis or stenosis in the neck or basilar artery. No emergent large vessel occlusion. 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 Remote infarcts involving the basal ganglia bilaterally as well as remote infarcts involving the body of the corpus colostrum. MRA showed extensive medium and small vessel disease. Patient did not receive TPA. Echocardiogram with ejection fraction of 65% no wall motion abnormalities. Neurology consulted presently on aspirin for CVA prophylaxis. Subcutaneous heparin for DVT prophylaxis. Dysphagia #2 thin liquid diet.   Review of Systems  Constitutional: Negative for chills and fever.  HENT: Negative for hearing loss.   Eyes: Negative for blurred vision and double vision.  Respiratory: Negative for cough and shortness of breath.   Cardiovascular: Positive for leg swelling. Negative for chest pain and palpitations.    Gastrointestinal: Positive for constipation. Negative for nausea and vomiting.  Genitourinary: Negative for dysuria, flank pain and hematuria.  Musculoskeletal: Positive for joint pain and myalgias.  Skin: Negative for rash.  Neurological: Positive for sensory change, focal weakness, weakness and headaches.  All other systems reviewed and are negative.  Past Medical History:  Diagnosis Date  . Polycystic disease, ovaries   . Sickle cell disease (HCC)    History reviewed. No pertinent surgical history related to stroke. History reviewed. No pertinent family history of premature CVA. Social History:  reports that she has never smoked. She has never used smokeless tobacco. She reports that she does not drink alcohol. Her drug history is not on file. Allergies: No Known Allergies Medications Prior to Admission  Medication Sig Dispense Refill  . Cyanocobalamin (VITAMIN B-12 PO) Take 2 tablets by mouth daily.    Marland Kitchen MAGNESIUM PO Take 1 tablet by mouth at bedtime.      Home: Home Living Family/patient expects to be discharged to:: Private residence Living Arrangements: Alone Available Help at Discharge: Family, Available 24 hours/day Type of Home: Apartment Home Access: Level entry Home Layout: One level Bathroom Shower/Tub: Tub/shower unit, Engineer, building services: Standard Home Equipment: Environmental consultant - 2 wheels  Functional History: Prior Function Level of Independence: Independent Comments: Drives Functional Status:  Mobility: Bed Mobility Overal bed mobility: Needs Assistance Bed Mobility: Supine to Sit Supine to sit: Min assist General bed mobility comments: Pt OOB in chair at start of session Transfers Overall transfer level: Needs assistance Equipment used: 2 person hand held assist, Rolling walker (2 wheeled) Transfers: Sit to/from Stand Sit to Stand: Min assist, +2 physical assistance General transfer comment: Min assist with B UE support during sit<>stand  required.  Unable to stand without L UE support for balance.  VC's for hand placement.  Extra time required to position R hand. Ambulation/Gait Ambulation/Gait assistance: +2 physical assistance, Min assist, +2 safety/equipment Ambulation Distance (Feet): 70 Feet Assistive device: 2 person hand held assist, Rolling walker (2 wheeled) Gait Pattern/deviations: Step-to pattern, Decreased step length - right, Trunk flexed, Wide base of support, Step-through pattern General Gait Details: Pt initially required +2 hand held assist at waist level for leverage and VC's for step to pattern.  Pt able to push through R arm so after 10 ft switched to RW with assistance/instruction to grip walker with R hand.  Pt completed ambulation with walker and step through sequence, requiring +1 min assist and chair follow. Pt reliant on RW for balance and stability. Gait velocity: decreased Gait velocity interpretation: Below normal speed for age/gender    ADL: ADL Overall ADL's : Needs assistance/impaired Eating/Feeding: Minimal assistance, Sitting Grooming: Sitting, Moderate assistance, Oral care, Wash/dry face Upper Body Bathing: Moderate assistance, Sitting Lower Body Bathing: Sit to/from stand, Maximal assistance Upper Body Dressing : Moderate assistance, Sitting Lower Body Dressing: Maximal assistance, Sit to/from stand Toilet Transfer: Minimal assistance, RW, Ambulation Toilet Transfer Details (indicate cue type and reason): Did take a few steps from bed to chair. Unable to control RW with R UE but benefits from L UE support. Toileting- Clothing Manipulation and Hygiene: Moderate assistance, Sit to/from stand Functional mobility during ADLs: Minimal assistance General ADL Comments: Pt unable to control RW with R UE this session but benefits from L UE support during ambulation. Reports slight dizziness on standing.  Cognition: Cognition Overall Cognitive Status: Within Functional Limits for tasks  assessed Orientation Level: Oriented X4, Oriented to person, Oriented to place, Oriented to time, Oriented to situation Cognition Arousal/Alertness: Awake/alert Behavior During Therapy: [facility] for tasks assessed/performed Overall Cognitive Status: Within Functional Limits for tasks assessed Area of Impairment: Problem solving Problem Solving: Slow processing, Difficulty sequencing General Comments: Difficulty with higher level cognitive tasks this session.  Blood pressure 124/84, pulse 87, temperature 97.7 F (36.5 C), temperature source Oral, resp. rate 18, height  (1.6 m), weight 102.8 kg (226 lb 9.6 oz), last menstrual period 01/31/2017, SpO2 100 %. Physical Exam  Vitals reviewed. Constitutional: She is oriented to person, place, and time. She appears well-developed and well-nourished.  HENT:  Head: Normocephalic and atraumatic.  Eyes: Conjunctivae and EOM are normal.  Neck: Normal range of motion. Neck supple. No thyromegaly present.  Cardiovascular: Normal rate and regular rhythm.   Respiratory: Effort normal and breath sounds normal. No respiratory distress.  GI: Soft. Bowel sounds are normal. She exhibits no distension.  Musculoskeletal: She exhibits no edema or tenderness.  Neurological: She is alert and oriented to person, place, and time.  Follows commands.  Fair awareness of deficits. Motor: LUE/LLE: 5/5 proximal to distal RUE: Shoulder abduction, elbow flexion/extension, wrist extension 1+/5, hand grip 2/5 RLE: 2/5 proximal to distal Sensation diminished to light touch right side DTRs symmetric  Skin: Skin is warm and dry.  Psychiatric: She has a normal mood and affect. Her behavior is normal. Thought content normal.    No results found for this or any previous visit (from the past 24 hour(s)). Ct Angio Head W Or Wo Contrast  Addendum Date: 02/25/2017   ADDENDUM REPORT: 02/25/2017 14:30 ADDENDUM: Preliminary report of this exam was discussed by telephone with Dr.  Johnn Hai on 02/25/2017 at 1343 hours. Electronically Signed   By: Althea Grimmer.D.  On: 02/25/2017 14:30   Result Date: 02/25/2017 CLINICAL DATA:  41 year old female with RIGHT side weakness. Confusion. Sickle cell disease. EXAM: CT ANGIOGRAPHY HEAD AND NECK TECHNIQUE: Multidetector CT imaging of the head and neck was performed using the standard protocol during bolus administration of intravenous contrast. Multiplanar CT image reconstructions and MIPs were obtained to evaluate the vascular anatomy. Carotid stenosis measurements (when applicable) are obtained utilizing NASCET criteria, using the distal internal carotid diameter as the denominator. CONTRAST:  100 mL Isovue 370 COMPARISON:  Head CT without contrast 1259 hours today. FINDINGS: CTA NECK Skeleton: No acute osseous abnormality identified. Cervical and thoracic endplates are relatively preserved. Visualized paranasal sinuses and mastoids are stable and well pneumatized. Upper chest: Partially visible cardiomegaly. Negative visualized thoracic aorta. No superior mediastinal lymphadenopathy. Negative visible lung parenchyma except for atelectasis. Other neck: Thyroid remarkable for 18 mm hypodense nodule on the left. Negative larynx, pharynx, parapharyngeal spaces, retropharyngeal space (partially retropharyngeal right carotid), sublingual space, submandibular glands and parotid glands. No cervical lymphadenopathy. Aortic arch: 3 vessel arch configuration, no atherosclerosis. Enhancement of small venous collaterals about the right shoulder. 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 Right carotid system: Mildly tortuous brachiocephalic artery with no stenosis. Negative proximal right CCA. Partially retropharyngeal right carotid bifurcation. Negative cervical right ICA aside from mild tortuosity. Left carotid system: Negative. Vertebral arteries:No proximal right subclavian  stenosis. Non dominant right vertebral artery. Right vertebral artery origin and cervical right vertebral arteries are within normal limits. No proximal left subclavian artery stenosis. Dominant left vertebral artery. No left vertebral origin stenosis. Tortuous left V1 segment. Mildly tortuous left V2 segment, no stenosis to the skullbase. CTA HEAD Posterior circulation: Distal left vertebral artery is dominant. No distal vertebral stenosis. Both PICA origins are patent. No basilar stenosis. Normal SCA origins. Normal left PCA origin. Fetal type right PCA. Left posterior communicating artery is present. Mild right P1 and P2 and moderate to severe left P1 and P2, irregularity and stenosis as seen on series 407, image 52. Preserved bilateral distal PCA flow. Anterior circulation: Both ICA siphons are patent without atherosclerosis or stenosis. Normal ophthalmic and posterior communicating artery origins. Patent carotid termini. However, there is moderate irregularity and stenosis at the right M1 origin (series 403, image 108). The right MCA M1 segment is patent but there is moderate to severe irregularity and stenosis also in the mid M1 (series 403, image 103 and see also series 407, image 52). Despite this the right MCA bifurcation is patent. No right M2 branch occlusion identified. Right MCA M2 and M3 branches are within normal limits. Left MCA origin and proximal M1 segment are normal. There is mild irregularity in the mid left M1 segment. The left MCA bifurcation is patent. There is moderate left M2 and moderate to severe left M3 irregularity and stenosis as seen on series 406, image 32. ACA origins and anterior communicating arteries are within normal limits. Proximal A2 branches are within normal limits but there is severe left ACA A2 segment stenosis near the callosomarginal artery, and moderate to severe bilateral distal ACA/pericallosal artery irregularity and stenosis (series 406, image 25). Venous sinuses:  Patent. Anatomic variants: Dominant left vertebral artery. Fetal type right PCA origin. Review of the MIP images confirms the above findings IMPRESSION: 1. No atherosclerosis or stenosis in the neck, ICA siphons, or basilar artery. But there is widespread first and second order circle of Willis branch irregularity and  stenosis. Still, there is no emergent large vessel occlusion or target for endovascular intervention identified. 2. Moderate or severe stenoses of the: - Left PCA P1 and P2, - Right MCA origin and M1, - Left ACA A2 and bilateral distal pericallosal arteries, - Left MCA M2 and M3. 3. Left thyroid 18 mm nodule meets consensus criteria for nonemergent follow-up Thyroid Ultrasound characterization. Electronically Signed: By: Odessa Fleming M.D. On: 02/25/2017 13:57   Dg Chest 2 View  Result Date: 02/25/2017 CLINICAL DATA:  Right side numbness, facial droop, history of sickle cell disease EXAM: CHEST  2 VIEW COMPARISON:  05/07/2006 FINDINGS: Borderline cardiomegaly. Right IJ Port-A-Cath with tip in upper SVC is unchanged in position. No acute infiltrate or pulmonary edema. Mild left basilar atelectasis. Mild degenerative changes mid thoracic spine. IMPRESSION: No active cardiopulmonary disease.  Mild left basilar atelectasis. Electronically Signed   By: Natasha Mead M.D.   On: 02/25/2017 17:19   Ct Angio Neck W And/or Wo Contrast  Addendum Date: 02/25/2017   ADDENDUM REPORT: 02/25/2017 14:30 ADDENDUM: Preliminary report of this exam was discussed by telephone with Dr. Johnn Hai on 02/25/2017 at 1343 hours. Electronically Signed   By: Odessa Fleming M.D.   On: 02/25/2017 14:30   Result Date: 02/25/2017 CLINICAL DATA:  41 year old female with RIGHT side weakness. Confusion. Sickle cell disease. EXAM: CT ANGIOGRAPHY HEAD AND NECK TECHNIQUE: Multidetector CT imaging of the head and neck was performed using the standard protocol during bolus administration of intravenous contrast. Multiplanar CT image reconstructions  and MIPs were obtained to evaluate the vascular anatomy. Carotid stenosis measurements (when applicable) are obtained utilizing NASCET criteria, using the distal internal carotid diameter as the denominator. CONTRAST:  100 mL Isovue 370 COMPARISON:  Head CT without contrast 1259 hours today. FINDINGS: CTA NECK Skeleton: No acute osseous abnormality identified. Cervical and thoracic endplates are relatively preserved. Visualized paranasal sinuses and mastoids are stable and well pneumatized. Upper chest: Partially visible cardiomegaly. Negative visualized thoracic aorta. No superior mediastinal lymphadenopathy. Negative visible lung parenchyma except for atelectasis. Other neck: Thyroid remarkable for 18 mm hypodense nodule on the left. Negative larynx, pharynx, parapharyngeal spaces, retropharyngeal space (partially retropharyngeal right carotid), sublingual space, submandibular glands and parotid glands. No cervical lymphadenopathy. Aortic arch: 3 vessel arch configuration, no atherosclerosis. Enhancement of small venous collaterals about the right shoulder. 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 Right carotid system: Mildly tortuous brachiocephalic artery with no stenosis. Negative proximal right CCA. Partially retropharyngeal right carotid bifurcation. Negative cervical right ICA aside from mild tortuosity. Left carotid system: Negative. Vertebral arteries:No proximal right subclavian stenosis. Non dominant right vertebral artery. Right vertebral artery origin and cervical right vertebral arteries are within normal limits. No proximal left subclavian artery stenosis. Dominant left vertebral artery. No left vertebral origin stenosis. Tortuous left V1 segment. Mildly tortuous left V2 segment, no stenosis to the skullbase. CTA HEAD Posterior circulation: Distal left vertebral artery is dominant. No distal vertebral stenosis. Both PICA  origins are patent. No basilar stenosis. Normal SCA origins. Normal left PCA origin. Fetal type right PCA. Left posterior communicating artery is present. Mild right P1 and P2 and moderate to severe left P1 and P2, irregularity and stenosis as seen on series 407, image 52. Preserved bilateral distal PCA flow. Anterior circulation: Both ICA siphons are patent without atherosclerosis or stenosis. Normal ophthalmic and posterior communicating artery origins. Patent carotid termini. However, there is moderate irregularity  and stenosis at the right M1 origin (series 403, image 108). The right MCA M1 segment is patent but there is moderate to severe irregularity and stenosis also in the mid M1 (series 403, image 103 and see also series 407, image 52). Despite this the right MCA bifurcation is patent. No right M2 branch occlusion identified. Right MCA M2 and M3 branches are within normal limits. Left MCA origin and proximal M1 segment are normal. There is mild irregularity in the mid left M1 segment. The left MCA bifurcation is patent. There is moderate left M2 and moderate to severe left M3 irregularity and stenosis as seen on series 406, image 32. ACA origins and anterior communicating arteries are within normal limits. Proximal A2 branches are within normal limits but there is severe left ACA A2 segment stenosis near the callosomarginal artery, and moderate to severe bilateral distal ACA/pericallosal artery irregularity and stenosis (series 406, image 25). Venous sinuses: Patent. Anatomic variants: Dominant left vertebral artery. Fetal type right PCA origin. Review of the MIP images confirms the above findings IMPRESSION: 1. No atherosclerosis or stenosis in the neck, ICA siphons, or basilar artery. 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 Still, there is no emergent large vessel occlusion or target for endovascular intervention identified. 2. Moderate or severe  stenoses of the: - Left PCA P1 and P2, - Right MCA origin and M1, - Left ACA A2 and bilateral distal pericallosal arteries, - Left MCA M2 and M3. 3. Left thyroid 18 mm nodule meets consensus criteria for nonemergent follow-up Thyroid Ultrasound characterization. Electronically Signed: By: Odessa Fleming M.D. On: 02/25/2017 13:57   Mr Maxine Glenn Head Wo Contrast  Result Date: 02/25/2017 CLINICAL DATA:  Sudden onset of right upper and lower extremity weakness and facial droop. Hypertensive crisis. Sickle cell disease. EXAM: MRI HEAD WITHOUT CONTRAST MRA HEAD WITHOUT CONTRAST TECHNIQUE: Multiplanar, multiecho pulse sequences of the brain and surrounding structures were obtained without intravenous contrast. Angiographic images of the head were obtained using MRA technique without contrast. COMPARISON:  CT head without contrast and CTA head and neck from the same day. FINDINGS: MRI HEAD FINDINGS Brain: Diffusion-weighted images demonstrate restricted diffusion within the 80 left lack infarct noted on CT. Areas of T2 shine through or noted in the corona radiata bilaterally. No additional acute infarcts are present. There are remote infarcts involving the right basal ganglia. Scattered subcortical T2 hyperintensities are highly advanced for age. Bilateral T2 hyperintensities are present within the corona radiata. A remote infarct is present in the genu of the left internal capsule. A remote infarct is noted along the left side of the body of the corpus callosum. Vascular: Flow is present in the major intracranial arteries. Skull and upper cervical spine: The skullbase is within normal limits. The craniocervical junction is unremarkable. Midline sagittal structures are within normal limits. The upper cervical spine is normal. Sinuses/Orbits: The paranasal sinuses and mastoid air cells are clear. The globes and orbits are within normal limits MRA HEAD FINDINGS The internal carotid arteries are within normal limits the high cervical  segments through the ICA termini bilaterally. Mild narrowing is again seen at the origins of the right A1 and M1 segments. There is moderate narrowing of the distal right M1 segment. Segmental narrowing present throughout the MCA branch vessels bilaterally. The left vertebral artery is the dominant vessel. PICA origins are visualized and normal bilaterally. The basilar artery is normal. A moderate stenosis present proximal left posterior cerebral artery.  The right posterior cerebral artery is of fetal type. There is cecum at attenuation distal PCA branch vessels bilaterally. IMPRESSION: 1. 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 2. Remote infarcts involving the basal ganglia bilaterally. 3. Scattered white matter changes bilaterally are mildly advanced for age. This likely also reflects the sequela of microvascular ischemia or vasculitis. 4. Remote infarcts involving the body of the corpus callosum. 5. Extensive medium and small vessel disease evident on the MRA as described. Electronically Signed   By: Marin Roberts M.D.   On: 02/25/2017 15:07   Mr Brain Wo Contrast  Result Date: 02/25/2017 CLINICAL DATA:  Sudden onset of right upper and lower extremity weakness and facial droop. Hypertensive crisis. Sickle cell disease. EXAM: MRI HEAD WITHOUT CONTRAST MRA HEAD WITHOUT CONTRAST TECHNIQUE: Multiplanar, multiecho pulse sequences of the brain and surrounding structures were obtained without intravenous contrast. Angiographic images of the head were obtained using MRA technique without contrast. COMPARISON:  CT head without contrast and CTA head and neck from the same day. FINDINGS: MRI HEAD FINDINGS Brain: Diffusion-weighted images demonstrate restricted diffusion within the 80 left lack infarct noted on CT. Areas of T2 shine through or noted in the corona radiata bilaterally. No additional acute infarcts are present. There are remote infarcts involving the right  basal ganglia. Scattered subcortical T2 hyperintensities are highly advanced for age. Bilateral T2 hyperintensities are present within the corona radiata. A remote infarct is present in the genu of the left internal capsule. A remote infarct is noted along the left side of the body of the corpus callosum. Vascular: Flow is present in the major intracranial arteries. Skull and upper cervical spine: The skullbase is within normal limits. The craniocervical junction is unremarkable. Midline sagittal structures are within normal limits. The upper cervical spine is normal. Sinuses/Orbits: The paranasal sinuses and mastoid air cells are clear. The globes and orbits are within normal limits MRA HEAD FINDINGS The internal carotid arteries are within normal limits the high cervical segments through the ICA termini bilaterally. Mild narrowing is again seen at the origins of the right A1 and M1 segments. There is moderate narrowing of the distal right M1 segment. Segmental narrowing present throughout the MCA branch vessels bilaterally. The left vertebral artery is the dominant vessel. PICA origins are visualized and normal bilaterally. The basilar artery is normal. A moderate stenosis present proximal left posterior cerebral artery. The right posterior cerebral artery is of fetal type. There is cecum at attenuation distal PCA branch vessels bilaterally. IMPRESSION: 1. 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 2. Remote infarcts involving the basal ganglia bilaterally. 3. Scattered white matter changes bilaterally are mildly advanced for age. This likely also reflects the sequela of microvascular ischemia or vasculitis. 4. Remote infarcts involving the body of the corpus callosum. 5. Extensive medium and small vessel disease evident on the MRA as described. Electronically Signed   By: Marin Roberts M.D.   On: 02/25/2017 15:07   Ct Head Code Stroke W/o Cm  Result Date:  02/25/2017 CLINICAL DATA:  Code stroke. 41 year old female last seen normal this morning. Left facial droop and left side numbness. Initial encounter. Sickle cell disease EXAM: CT HEAD WITHOUT CONTRAST TECHNIQUE: Contiguous axial images were obtained from the base of the skull through the vertex without intravenous contrast. COMPARISON:  Report of brain MRI 07/11/1999 (no images available). FINDINGS: Brain: Overall normal cerebral volume. Chronic appearing lacunar infarct right basal ganglia. Small age indeterminate hypodensity  left thalamus (series 21, image 14). Focal chronic appearing lacunar infarct in the body of the corpus callosum (coronal image 34). Other small areas of cerebral white matter hypodensity. No midline shift, ventriculomegaly, mass effect, evidence of mass lesion, intracranial hemorrhage or evidence of cortically based acute infarction. Vascular: No suspicious intracranial vascular hyperdensity. Skull: Negative. Sinuses/Orbits: Clear. Other: Negative orbit and scalp soft tissues. ASPECTS [facility] Stroke Program Early CT Score) - Ganglionic level infarction (caudate, lentiform nuclei, internal capsule, insula, M1-M3 cortex): 7 - Supraganglionic infarction (M4-M6 cortex): 3 Total score (0-10 with 10 being normal): 10 IMPRESSION: 1. No acute cortically based infarct or acute intracranial hemorrhage. ASPECTS is 10. 2. Evidence of scattered small vessel ischemia, with chronic appearing involvement of the corpus callosum and right basal ganglia. Small age indeterminate area in the left thalamus. 3. The above was relayed via text pager to Dr. Johnn Hai on 02/25/2017 at 13:09 . Electronically Signed   By: Odessa Fleming M.D.   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  Records reviewed and summated above. Stroke: Continue secondary stroke prophylaxis and Risk Factor Modification listed below:    Antiplatelet therapy:   Blood Pressure Management:  Continue current medication with prn's with permisive HTN per primary team Statin Agent:   Prediabetes management:   Tobacco abuse:   Right sided hemiparesis: fit for orthosis to prevent contractures (resting hand splint for day, wrist cock up splint at night, PRAFO, etc) Motor recovery: Fluoxetine   1. Does the need for close, 24 hr/day medical supervision in concert with the patient's rehab needs make it unreasonable for this patient to be served in a less intensive setting? Yes 2. Co-Morbidities requiring supervision/potential complications: Dysphagia (advance diet as tolerated), HTN (monitor and provide prns in accordance with increased physical exertion and pain), polycystic disease, sickle cell disease (consider Heme/Onc consult, monitor, ensure appropriate hydration, manage pain with oral meds), prediabetes (Monitor in accordance with exercise and adjust meds as necessary), headache (Biofeedback training with therapies to help reduce reliance on opiate pain medications, particularly IV toradol -especially given CVA and dilaudid, monitor pain control during therapies, and sedation at rest and titrate to maximum efficacy to ensure participation and gains in therapies), leukocytosis (cont to monitor for signs and symptoms of infection, further workup if indicated) 3. Due to bladder management, safety, skin/wound care, disease management, pain management and patient education, does the patient require 24 hr/day rehab nursing? Yes 4. Does the patient require coordinated care of a physician, rehab nurse, PT (1-2 hrs/day, 5 days/week), OT (1-2 hrs/day, 5 days/week) and SLP (1-2 hrs/day, 5 days/week) to address physical and functional deficits in the context of the above medical diagnosis(es)? Yes Addressing deficits in the following areas: balance, endurance, locomotion, strength, transferring, bowel/bladder control, bathing, dressing, toileting,  swallowing and psychosocial support 5. Can the patient actively participate in an intensive therapy program of at least 3 hrs of therapy per day at least 5 days per week? Potentially 6. The potential for patient to make measurable gains while on inpatient rehab is excellent 7. Anticipated functional outcomes upon discharge from inpatient rehab are modified independent  with PT, modified independent and supervision with OT, modified independent with SLP. 8. Estimated rehab length of stay to reach the above functional goals is: 12-17 days. 9. Does the patient have adequate social supports and living environment to accommodate these discharge functional goals? Yes 10. Anticipated D/C setting: Home 11. Anticipated  post D/C treatments: HH therapy and Home excercise program 12. Overall Rehab/Functional Prognosis: good  RECOMMENDATIONS: This patient's condition is appropriate for continued rehabilitative care in the following setting: CIR once medically stable and workup complete. Patient has agreed to participate in recommended program. Yes Note that insurance prior authorization may be required for reimbursement for recommended care.  Comment: Rehab Admissions Coordinator to follow up.  Maryla Morrow, MD, [place] [facility] Amor., PA-C 02/27/2017

## 2017-02-27 NOTE — Care Management Note (Signed)
Case Management Note  Patient Details  Name: Sharon Chan MRN: [id number] Date of Birth: 1976-06-10  Subjective/Objective:   Pt admitted with CVA. She is from home alone.                  Action/Plan: PT/OT recommending CIR. CM following for d/c disposition.   Expected Discharge Date:                  Expected Discharge Plan:  IP Rehab Facility  In-House Referral:     Discharge planning Services     Post Acute Care Choice:    Choice offered to:     DME Arranged:    DME Agency:     HH Arranged:    HH Agency:     Status of Service:  In process, will continue to follow  If discussed at Long Length of Stay Meetings, dates discussed:    Additional Comments:  Kermit Balo, RN 02/27/2017, 10:27 AM

## 2017-02-27 NOTE — Progress Notes (Addendum)
SICKLE CELL SERVICE PROGRESS NOTE  Sharon Chan WUJ:[id number] DOB: 08-Aug-1976 DOA: 02/25/2017 PCP: Unknown  Assessment/Plan: Principal Problem:   Stroke (cerebrum) (HCC) Active Problems:   Sickle cell disease (HCC)   Stroke (HCC)  1. 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 The genotype and severity of her disease is unknown at this time. The CVA is in a region more consistent with hypertensive CVA than Sickle Cell Vasculopathy. However, considering that she has had previous strokes and with unknown severity of sickle cell disease. I will assume worst case scenario and proceed with partial exchange transfusion. However I will obtain a smear review prior to the exchange transfusion.  She will also benefit from establishing an appointment with Primary Care as well as Hematologist who commonly manages Sickle Cell Disease. Appreciate Neurology input. Continue ASA and Statin.  2. Sickle Cell Crisis: Pt c/o pain which she reports is characteristic of sickle cell crisis. Will schedule Toradol and re-evaluate. Will likely discontinue IV dilaudid and manage with oral opiate as she is completely Opiate naive and has not used opiates in more than 2 years.  3. Polycystic Ovarian Syndrome: Pt had been on Spironolactone and stopped it after she discontinued her care with her most recent PMD. 4. Sickle Cell Intra-Cranial Vasculopathy: She does not have moya-moya per MRA. However evidence of previous CVA suggest that she may require chronic red cell pheresis vs Hydrea for secondary prevention of CVA if she does have Sickle Cell Anemia. 5.  Thyroid nodule: Thyroid nodule seen on CTA of neck. Will check TSH. If no abnormality in hormone will follow-up as out patient.   Code Status: Full Code Family Communication: N/A Disposition Plan: Not  yet ready for discharge  Ronita Hargreaves A.  Pager [phone]. If 7PM-7AM, please contact night-coverage.  02/27/2017, 10:42 AM  LOS: 2 days   Interim History: Pt reports that she feels that her RUE is weaker than it was yesterday. However the Neurologist who evaluated her yesterday is present and per his assessment there is no objective change. She also c/o pain in the LUE, and b/l hips which is characteristic of Sickle Cell Crisis. She reports that she has minor crises at home which she manages with Ibuprofen. She also reports that she has never seen a Hematologist as far as she knows and has had no medical care in the last 2 years as the experience with her last provider scared her away from Physicians. She does not know her genotype and states that she has had maybe 3 transfusions in her lifetime. She is able to converse appropriately and move RUE through partial range of shoulder flexion. She has no drooling and only mild slurring of speech.  Consultants:  Neurology  Procedures:  None  Antibiotics:  None  HPI/Subjective: Pt became very teary and admits that she is scared because of the current stroke and the knowledge that she has had strokes in the past without being aware.   Objective: Vitals:   02/26/17 2140 02/27/17 0055 02/27/17 0546 02/27/17 0951  BP: 137/78 120/73 124/84 (!) 134/100  Pulse: 72 78 87 92  Resp: Temp: 98.4 F (36.9 C) 97.5 F (36.4 C) 97.7 F (36.5 C) 98.8 F (37.1 C)  TempSrc: Oral Oral Oral Oral  SpO2: 96% 99% 100% 100%  Weight:  Height:       Weight change:  No intake or output data in the 24 hours ending 02/27/17 1042  General: Alert, awake, oriented x3, in no acute distress.  HEENT: /AT PEERL, EOMI, anicteric Neck: Trachea midline,  no masses, no thyromegal,y no JVD, no carotid bruit OROPHARYNX:  Moist, No exudate/ erythema/lesions.  Heart: Regular rate and rhythm, without murmurs, rubs, gallops, PMI non-displaced, no  heaves or thrills on palpation.  Lungs: Clear to auscultation, no wheezing or rhonchi noted. No increased vocal fremitus resonant to percussion  Abdomen: Soft, nontender, nondistended, positive bowel sounds, no masses no hepatosplenomegaly noted..  Neuro: mild slurring of speech and tongue deviation to the left. She is able to move RUE partialy through the range against gravity.  Musculoskeletal: No warmth swelling or erythema around joints, no spinal tenderness noted. Psychiatric: Patient alert and oriented x3, good insight and cognition, good recent to remote recall.    Data Reviewed: Basic Metabolic Panel:  Recent Labs Lab 02/25/17 1307 02/25/17 1310 02/25/17 1834  NA 142 140  --   K 3.6 4.0  --   CL 106 104  --   CO2  --  26  --   GLUCOSE 124* 134*  --   BUN 13 10  --   CREATININE 1.00 1.08* 0.91  CALCIUM  --  9.2  --   MG  --   --  2.0   Liver Function Tests:  Recent Labs Lab 02/25/17 1310  AST 20  ALT 19  ALKPHOS 76  BILITOT 0.3  PROT 6.4*  ALBUMIN 3.6   No results for input(s): LIPASE, AMYLASE in the last 168 hours. No results for input(s): AMMONIA in the last 168 hours. CBC:  Recent Labs Lab 02/25/17 1307 02/25/17 1310 02/25/17 1834  WBC  --  8.0 8.3  NEUTROABS  --  3.5  --   HGB 14.6 13.1 13.3  HCT 43.0 39.4 39.8  MCV  --  76.2* 75.2*  PLT  --  297 303   Cardiac Enzymes: No results for input(s): CKTOTAL, CKMB, CKMBINDEX, TROPONINI in the last 168 hours. BNP (last 3 results) No results for input(s): BNP in the last 8760 hours.  ProBNP (last 3 results) No results for input(s): PROBNP in the last 8760 hours.  CBG: No results for input(s): GLUCAP in the last 168 hours.  No results found for this or any previous visit (from the past 240 hour(s)).   Studies: Ct Angio Head W Or Wo Contrast  Addendum Date: 02/25/2017   ADDENDUM REPORT: 02/25/2017 14:30 ADDENDUM: Preliminary report of this exam was discussed by telephone with Dr. Johnn Hai on  02/25/2017 at 1343 hours. Electronically Signed   By: Odessa Fleming M.D.   On: 02/25/2017 14:30   Result Date: 02/25/2017 CLINICAL DATA:  41 year old female with RIGHT side weakness. Confusion. Sickle cell disease. EXAM: CT ANGIOGRAPHY HEAD AND NECK TECHNIQUE: Multidetector CT imaging of the head and neck was performed using the standard protocol during bolus administration of intravenous contrast. Multiplanar CT image reconstructions and MIPs were obtained to evaluate the vascular anatomy. Carotid stenosis measurements (when applicable) are obtained utilizing NASCET criteria, using the distal internal carotid diameter as the denominator. CONTRAST:  100 mL Isovue 370 COMPARISON:  Head CT without contrast 1259 hours today. FINDINGS: CTA NECK Skeleton: No acute osseous abnormality identified. Cervical and thoracic endplates are relatively preserved. Visualized paranasal sinuses and mastoids are stable and well pneumatized. Upper chest: Partially visible cardiomegaly. Negative visualized thoracic aorta.  No superior mediastinal lymphadenopathy. Negative visible lung parenchyma except for atelectasis. Other neck: Thyroid remarkable for 18 mm hypodense nodule on the left. Negative larynx, pharynx, parapharyngeal spaces, retropharyngeal space (partially retropharyngeal right carotid), sublingual space, submandibular glands and parotid glands. No cervical lymphadenopathy. Aortic arch: 3 vessel arch configuration, no atherosclerosis. Enhancement of small venous collaterals about the right shoulder. 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 Right carotid system: Mildly tortuous brachiocephalic artery with no stenosis. Negative proximal right CCA. Partially retropharyngeal right carotid bifurcation. Negative cervical right ICA aside from mild tortuosity. Left carotid system: Negative. Vertebral arteries:No proximal right subclavian stenosis. Non dominant  right vertebral artery. Right vertebral artery origin and cervical right vertebral arteries are within normal limits. No proximal left subclavian artery stenosis. Dominant left vertebral artery. No left vertebral origin stenosis. Tortuous left V1 segment. Mildly tortuous left V2 segment, no stenosis to the skullbase. CTA HEAD Posterior circulation: Distal left vertebral artery is dominant. No distal vertebral stenosis. Both PICA origins are patent. No basilar stenosis. Normal SCA origins. Normal left PCA origin. Fetal type right PCA. Left posterior communicating artery is present. Mild right P1 and P2 and moderate to severe left P1 and P2, irregularity and stenosis as seen on series 407, image 52. Preserved bilateral distal PCA flow. Anterior circulation: Both ICA siphons are patent without atherosclerosis or stenosis. Normal ophthalmic and posterior communicating artery origins. Patent carotid termini. However, there is moderate irregularity and stenosis at the right M1 origin (series 403, image 108). The right MCA M1 segment is patent but there is moderate to severe irregularity and stenosis also in the mid M1 (series 403, image 103 and see also series 407, image 52). Despite this the right MCA bifurcation is patent. No right M2 branch occlusion identified. Right MCA M2 and M3 branches are within normal limits. Left MCA origin and proximal M1 segment are normal. There is mild irregularity in the mid left M1 segment. The left MCA bifurcation is patent. There is moderate left M2 and moderate to severe left M3 irregularity and stenosis as seen on series 406, image 32. ACA origins and anterior communicating arteries are within normal limits. Proximal A2 branches are within normal limits but there is severe left ACA A2 segment stenosis near the callosomarginal artery, and moderate to severe bilateral distal ACA/pericallosal artery irregularity and stenosis (series 406, image 25). Venous sinuses: Patent. Anatomic  variants: Dominant left vertebral artery. Fetal type right PCA origin. Review of the MIP images confirms the above findings IMPRESSION: 1. No atherosclerosis or stenosis in the neck, ICA siphons, or basilar artery. 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 Still, there is no emergent large vessel occlusion or target for endovascular intervention identified. 2. Moderate or severe stenoses of the: - Left PCA P1 and P2, - Right MCA origin and M1, - Left ACA A2 and bilateral distal pericallosal arteries, - Left MCA M2 and M3. 3. Left thyroid 18 mm nodule meets consensus criteria for nonemergent follow-up Thyroid Ultrasound characterization. Electronically Signed: By: Odessa Fleming M.D. On: 02/25/2017 13:57   Dg Chest 2 View  Result Date: 02/25/2017 CLINICAL DATA:  Right side numbness, facial droop, history of sickle cell disease EXAM: CHEST  2 VIEW COMPARISON:  05/07/2006 FINDINGS: Borderline cardiomegaly. Right IJ Port-A-Cath with tip in upper SVC is unchanged in position. No acute infiltrate or pulmonary edema. Mild left basilar atelectasis.  Mild degenerative changes mid thoracic spine. IMPRESSION: No active cardiopulmonary disease.  Mild left basilar atelectasis. Electronically Signed   By: Natasha Mead M.D.   On: 02/25/2017 17:19   Ct Angio Neck W And/or Wo Contrast  Addendum Date: 02/25/2017   ADDENDUM REPORT: 02/25/2017 14:30 ADDENDUM: Preliminary report of this exam was discussed by telephone with Dr. Johnn Hai on 02/25/2017 at 1343 hours. Electronically Signed   By: Odessa Fleming M.D.   On: 02/25/2017 14:30   Result Date: 02/25/2017 CLINICAL DATA:  41 year old female with RIGHT side weakness. Confusion. Sickle cell disease. EXAM: CT ANGIOGRAPHY HEAD AND NECK TECHNIQUE: Multidetector CT imaging of the head and neck was performed using the standard protocol during bolus administration of intravenous contrast. Multiplanar CT image reconstructions and MIPs were  obtained to evaluate the vascular anatomy. Carotid stenosis measurements (when applicable) are obtained utilizing NASCET criteria, using the distal internal carotid diameter as the denominator. CONTRAST:  100 mL Isovue 370 COMPARISON:  Head CT without contrast 1259 hours today. FINDINGS: CTA NECK Skeleton: No acute osseous abnormality identified. Cervical and thoracic endplates are relatively preserved. Visualized paranasal sinuses and mastoids are stable and well pneumatized. Upper chest: Partially visible cardiomegaly. Negative visualized thoracic aorta. No superior mediastinal lymphadenopathy. Negative visible lung parenchyma except for atelectasis. Other neck: Thyroid remarkable for 18 mm hypodense nodule on the left. Negative larynx, pharynx, parapharyngeal spaces, retropharyngeal space (partially retropharyngeal right carotid), sublingual space, submandibular glands and parotid glands. No cervical lymphadenopathy. Aortic arch: 3 vessel arch configuration, no atherosclerosis. Enhancement of small venous collaterals about the right shoulder. 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 Right carotid system: Mildly tortuous brachiocephalic artery with no stenosis. Negative proximal right CCA. Partially retropharyngeal right carotid bifurcation. Negative cervical right ICA aside from mild tortuosity. Left carotid system: Negative. Vertebral arteries:No proximal right subclavian stenosis. Non dominant right vertebral artery. Right vertebral artery origin and cervical right vertebral arteries are within normal limits. No proximal left subclavian artery stenosis. Dominant left vertebral artery. No left vertebral origin stenosis. Tortuous left V1 segment. Mildly tortuous left V2 segment, no stenosis to the skullbase. CTA HEAD Posterior circulation: Distal left vertebral artery is dominant. No distal vertebral stenosis. Both PICA origins are  patent. No basilar stenosis. Normal SCA origins. Normal left PCA origin. Fetal type right PCA. Left posterior communicating artery is present. Mild right P1 and P2 and moderate to severe left P1 and P2, irregularity and stenosis as seen on series 407, image 52. Preserved bilateral distal PCA flow. Anterior circulation: Both ICA siphons are patent without atherosclerosis or stenosis. Normal ophthalmic and posterior communicating artery origins. Patent carotid termini. However, there is moderate irregularity and stenosis at the right M1 origin (series 403, image 108). The right MCA M1 segment is patent but there is moderate to severe irregularity and stenosis also in the mid M1 (series 403, image 103 and see also series 407, image 52). Despite this the right MCA bifurcation is patent. No right M2 branch occlusion identified. Right MCA M2 and M3 branches are within normal limits. Left MCA origin and proximal M1 segment are normal. There is mild irregularity in the mid left M1 segment. The left MCA bifurcation is patent. There is moderate left M2 and moderate to severe left M3 irregularity and stenosis as seen on series 406, image 32. ACA origins and anterior communicating arteries are within normal limits. Proximal A2 branches are within normal limits  but there is severe left ACA A2 segment stenosis near the callosomarginal artery, and moderate to severe bilateral distal ACA/pericallosal artery irregularity and stenosis (series 406, image 25). Venous sinuses: Patent. Anatomic variants: Dominant left vertebral artery. Fetal type right PCA origin. Review of the MIP images confirms the above findings IMPRESSION: 1. No atherosclerosis or stenosis in the neck, ICA siphons, or basilar artery. 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 Still, there is no emergent large vessel occlusion or target for endovascular intervention identified. 2. Moderate or severe stenoses of  the: - Left PCA P1 and P2, - Right MCA origin and M1, - Left ACA A2 and bilateral distal pericallosal arteries, - Left MCA M2 and M3. 3. Left thyroid 18 mm nodule meets consensus criteria for nonemergent follow-up Thyroid Ultrasound characterization. Electronically Signed: By: Odessa Fleming M.D. On: 02/25/2017 13:57   Mr Maxine Glenn Head Wo Contrast  Result Date: 02/25/2017 CLINICAL DATA:  Sudden onset of right upper and lower extremity weakness and facial droop. Hypertensive crisis. Sickle cell disease. EXAM: MRI HEAD WITHOUT CONTRAST MRA HEAD WITHOUT CONTRAST TECHNIQUE: Multiplanar, multiecho pulse sequences of the brain and surrounding structures were obtained without intravenous contrast. Angiographic images of the head were obtained using MRA technique without contrast. COMPARISON:  CT head without contrast and CTA head and neck from the same day. FINDINGS: MRI HEAD FINDINGS Brain: Diffusion-weighted images demonstrate restricted diffusion within the 80 left lack infarct noted on CT. Areas of T2 shine through or noted in the corona radiata bilaterally. No additional acute infarcts are present. There are remote infarcts involving the right basal ganglia. Scattered subcortical T2 hyperintensities are highly advanced for age. Bilateral T2 hyperintensities are present within the corona radiata. A remote infarct is present in the genu of the left internal capsule. A remote infarct is noted along the left side of the body of the corpus callosum. Vascular: Flow is present in the major intracranial arteries. Skull and upper cervical spine: The skullbase is within normal limits. The craniocervical junction is unremarkable. Midline sagittal structures are within normal limits. The upper cervical spine is normal. Sinuses/Orbits: The paranasal sinuses and mastoid air cells are clear. The globes and orbits are within normal limits MRA HEAD FINDINGS The internal carotid arteries are within normal limits the high cervical segments  through the ICA termini bilaterally. Mild narrowing is again seen at the origins of the right A1 and M1 segments. There is moderate narrowing of the distal right M1 segment. Segmental narrowing present throughout the MCA branch vessels bilaterally. The left vertebral artery is the dominant vessel. PICA origins are visualized and normal bilaterally. The basilar artery is normal. A moderate stenosis present proximal left posterior cerebral artery. The right posterior cerebral artery is of fetal type. There is cecum at attenuation distal PCA branch vessels bilaterally. IMPRESSION: 1. 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 2. Remote infarcts involving the basal ganglia bilaterally. 3. Scattered white matter changes bilaterally are mildly advanced for age. This likely also reflects the sequela of microvascular ischemia or vasculitis. 4. Remote infarcts involving the body of the corpus callosum. 5. Extensive medium and small vessel disease evident on the MRA as described. Electronically Signed   By: Marin Roberts M.D.   On: 02/25/2017 15:07   Mr Brain Wo Contrast  Result Date: 02/25/2017 CLINICAL DATA:  Sudden onset of right upper and lower extremity weakness and facial droop. Hypertensive crisis. Sickle cell disease. EXAM:  MRI HEAD WITHOUT CONTRAST MRA HEAD WITHOUT CONTRAST TECHNIQUE: Multiplanar, multiecho pulse sequences of the brain and surrounding structures were obtained without intravenous contrast. Angiographic images of the head were obtained using MRA technique without contrast. COMPARISON:  CT head without contrast and CTA head and neck from the same day. FINDINGS: MRI HEAD FINDINGS Brain: Diffusion-weighted images demonstrate restricted diffusion within the 80 left lack infarct noted on CT. Areas of T2 shine through or noted in the corona radiata bilaterally. No additional acute infarcts are present. There are remote infarcts involving the right basal  ganglia. Scattered subcortical T2 hyperintensities are highly advanced for age. Bilateral T2 hyperintensities are present within the corona radiata. A remote infarct is present in the genu of the left internal capsule. A remote infarct is noted along the left side of the body of the corpus callosum. Vascular: Flow is present in the major intracranial arteries. Skull and upper cervical spine: The skullbase is within normal limits. The craniocervical junction is unremarkable. Midline sagittal structures are within normal limits. The upper cervical spine is normal. Sinuses/Orbits: The paranasal sinuses and mastoid air cells are clear. The globes and orbits are within normal limits MRA HEAD FINDINGS The internal carotid arteries are within normal limits the high cervical segments through the ICA termini bilaterally. Mild narrowing is again seen at the origins of the right A1 and M1 segments. There is moderate narrowing of the distal right M1 segment. Segmental narrowing present throughout the MCA branch vessels bilaterally. The left vertebral artery is the dominant vessel. PICA origins are visualized and normal bilaterally. The basilar artery is normal. A moderate stenosis present proximal left posterior cerebral artery. The right posterior cerebral artery is of fetal type. There is cecum at attenuation distal PCA branch vessels bilaterally. IMPRESSION: 1. 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 2. Remote infarcts involving the basal ganglia bilaterally. 3. Scattered white matter changes bilaterally are mildly advanced for age. This likely also reflects the sequela of microvascular ischemia or vasculitis. 4. Remote infarcts involving the body of the corpus callosum. 5. Extensive medium and small vessel disease evident on the MRA as described. Electronically Signed   By: Marin Roberts M.D.   On: 02/25/2017 15:07   Ct Head Code Stroke W/o Cm  Result Date:  02/25/2017 CLINICAL DATA:  Code stroke. 41 year old female last seen normal this morning. Left facial droop and left side numbness. Initial encounter. Sickle cell disease EXAM: CT HEAD WITHOUT CONTRAST TECHNIQUE: Contiguous axial images were obtained from the base of the skull through the vertex without intravenous contrast. COMPARISON:  Report of brain MRI 07/11/1999 (no images available). FINDINGS: Brain: Overall normal cerebral volume. Chronic appearing lacunar infarct right basal ganglia. Small age indeterminate hypodensity left thalamus (series 21, image 14). Focal chronic appearing lacunar infarct in the body of the corpus callosum (coronal image 34). Other small areas of cerebral white matter hypodensity. No midline shift, ventriculomegaly, mass effect, evidence of mass lesion, intracranial hemorrhage or evidence of cortically based acute infarction. Vascular: No suspicious intracranial vascular hyperdensity. Skull: Negative. Sinuses/Orbits: Clear. Other: Negative orbit and scalp soft tissues. ASPECTS [facility] Stroke Program Early CT Score) - Ganglionic level infarction (caudate, lentiform nuclei, internal capsule, insula, M1-M3 cortex): 7 - Supraganglionic infarction (M4-M6 cortex): 3 Total score (0-10 with 10 being normal): 10 IMPRESSION: 1. No acute cortically based infarct or acute intracranial hemorrhage. ASPECTS is 10. 2. Evidence of scattered small vessel ischemia, with chronic appearing involvement of the corpus callosum and right  basal ganglia. Small age indeterminate area in the left thalamus. 3. The above was relayed via text pager to Dr. Johnn Hai on 02/25/2017 at 13:09 . Electronically Signed   By: Odessa Fleming M.D.   On: 02/25/2017 13:09    Scheduled Meds: .  stroke: mapping our early stages of recovery book   Does not apply Once  . sodium chloride   Intravenous Once  . aspirin  300 mg Rectal Daily   Or  . aspirin  325 mg Oral Daily  . aspirin  325 mg Oral Once  . atorvastatin  40 mg Oral  q1800  . heparin  5,000 Units Subcutaneous Q8H  . vitamin B-12  100 mcg Oral Daily   Continuous Infusions:  Principal Problem:   Stroke (cerebrum) (HCC) Active Problems:   Sickle cell disease (HCC)   Stroke (HCC)     In excess of 40 minutes spent during this visit. Greater than 50% involved face to face contact with the patient for assessment, counseling and coordination of care.

## 2017-02-27 NOTE — Progress Notes (Signed)
Pt. c/o feeling weaker on the left, also per speech noted left tongue deviation. Moves left side well, no noted weakness on left during assessment. C/o constant headache and nausea. Dr. Ashley Royalty and Annie Main, NP notified. Dr. Pearlean Brownie and Dr. Ashley Royalty came and saw the patient.

## 2017-02-27 NOTE — Evaluation (Signed)
Physical Therapy Evaluation [name] MRN: [id number] DOB: Jan 11, 1976 Today's Date: 02/27/2017   History of Present Illness  Pt is a 41 y.o. female who presented to the ED with R LE weakness, R UE weakness and numbness/tingling, and R facial numbness. 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 MRI revealed acute infarct in L thalamus. Pt with PMH significant for polycystic disease (ovaries), and sickle cell disease.  Clinical Impression  Pt slowly progressing towards goals. Reporting increased blurriness and decreased vision in R eye which is worse today than it has been. Notified RN. Pt reporting dizziness during ambulation; vitals checked and BP elevated. See vitals flowsheet; RN notified and aware. Pt tearful this session secondary to new deficits, however, continues to have positive outlook. Current recommendations appropriate as pt is very motivated to regain independence. Will continue to progress mobility according to pt tolerance.     Follow Up Recommendations CIR    Equipment Recommendations  None recommended by PT    Recommendations for Other Services Rehab consult     Precautions / Restrictions Precautions Precautions: Fall Restrictions Weight Bearing Restrictions: No Other Position/Activity Restrictions: R-sided weakness      Mobility  Bed Mobility Overal bed mobility: Needs Assistance Bed Mobility: Supine to Sit     Supine to sit: Min assist     General bed mobility comments: Min A for RLE management during bed mobility.   Transfers Overall transfer level: Needs assistance Equipment used: Rolling walker (2 wheeled) Transfers: Sit to/from Stand Sit to Stand: Min assist         General transfer comment: Min A for lift assist. Required assist to place R hand on RW and manual assist to maintain grip. Verbal cues for appropriate placement of LUE during transfer.    Ambulation/Gait Ambulation/Gait assistance: Min assist;+2 safety/equipment;Mod assist Ambulation Distance (Feet): 15 Feet Assistive device: Rolling walker (2 wheeled) Gait Pattern/deviations: Step-to pattern;Decreased step length - right;Trunk flexed;Wide base of support;Decreased stance time - right Gait velocity: decreased Gait velocity interpretation: Below normal speed for age/gender General Gait Details: Chair follow for safety. Verbal cues for sequencing with RLE. Manual assist to maintain RUE grip on RW during ambulation. Pt reporting dizziness which got worse with ambulation, so distance limited.   Stairs            Wheelchair Mobility    Modified Rankin (Stroke Patients Only)       Balance Overall balance assessment: Needs assistance Sitting-balance support: No upper extremity supported;Feet supported Sitting balance-Leahy Scale: Fair     Standing balance support: Bilateral upper extremity supported Standing balance-Leahy Scale: Poor Standing balance comment: Reliant on RW for stability                              Pertinent Vitals/Pain Pain Assessment: Faces Faces Pain Scale: No hurt    Home Living                        Prior Function                 Hand Dominance        Extremity/Trunk Assessment                Communication      Cognition Arousal/Alertness: Awake/alert Behavior During Therapy: WFL for tasks assessed/performed Overall Cognitive Status: Impaired/Different from baseline Area of Impairment: Problem  solving                             Problem Solving: Slow processing;Difficulty sequencing General Comments: Difficulty with higher level problem solving. Pt very tearful this session stating, "It's just a lot to take in," but remaining positive during session.       General Comments General comments (skin integrity, edema, etc.): Pt reporting her vision in her R eye had gotten worse  in the hour before surgery. Pt also reporting dizziness with ambulation. BP checked twice and recorded at 162/108 mmHg with a MAP of 125, and 153/108 mmHg and a MAP of 120. RN notified about symptoms during ambulation, R eye vision, and BP.     Exercises     Assessment/Plan    PT Assessment    PT Problem List         PT Treatment Interventions      PT Goals (Current goals can be found in the Care Plan section)  Acute Rehab PT Goals Patient Stated Goal: to get back to normal PT Goal Formulation: With patient Time For Goal Achievement: 03/12/17 Potential to Achieve Goals: Good    Frequency Min 4X/week   Barriers to discharge        Co-evaluation               End of Session Equipment Utilized During Treatment: Gait belt Activity Tolerance: Treatment limited secondary to medical complications (Comment) (elevated BP, dizziness) Patient left: in chair;with call bell/phone within reach Nurse Communication: Mobility status PT Visit Diagnosis: Hemiplegia and hemiparesis;Muscle weakness (generalized) (M62.81);Unsteadiness on feet (R26.81) Hemiplegia - Right/Left: Right Hemiplegia - dominant/non-dominant: Non-dominant Hemiplegia - caused by: Cerebral infarction    Time: 1510-1531 PT Time Calculation (min) (ACUTE ONLY): 21 min   Charges:     PT Treatments $Gait Training: 8-22 mins   PT G Codes:        Margot Chimes, PT, DPT  Acute Rehabilitation Services  Pager: [phone]   Melvyn Novas 02/27/2017, 4:47 PM

## 2017-02-27 NOTE — Progress Notes (Signed)
Patient ID: Sharon Chan, female   DOB: [birth year], 41 y.o.   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 However the available data does is not usual for either genotype as her Hb is in the normal range and reticulocytes normal. A Hb electrophoresis is pending.  However a requested Tech reviewed peripheral smear shows only Target cells and Burr cells but no Sickle Cells.  In light of the above information, I spoke with the state designated CBO [facility]) for [place]  where patient reports that she was born.  Despite patient claims that she is registered with [facility], personal at [facility] report no record of this patient ever having been registered with their agency nor do they have any records of a positive newborn screen for this patient.   In light of the smear review, I will hold the partial exchange transfusion and obtain pathologist review of the peripheral smear.  Jeanna Giuffre A.

## 2017-02-27 NOTE — Progress Notes (Signed)
MD called RN to inform her to hold off the ordered blood transfusion exchange until she confirms pt's sickle cell diagnoses. Will continue to closely monitor pt. Dionne Bucy RN

## 2017-02-28 DIAGNOSIS — H539 Unspecified visual disturbance: Secondary | ICD-10-CM

## 2017-02-28 DIAGNOSIS — I6300 Cerebral infarction due to thrombosis of unspecified precerebral artery: Secondary | ICD-10-CM

## 2017-02-28 LAB — TSH: TSH: 2.264 u[IU]/mL (ref 0.350–4.500)

## 2017-02-28 LAB — PATHOLOGIST SMEAR REVIEW

## 2017-02-28 LAB — SEDIMENTATION RATE: Sed Rate: 37 mm/hr — ABNORMAL HIGH (ref 0–22)

## 2017-02-28 NOTE — Clinical Social Work Note (Signed)
Clinical Social Work Assessment  Patient Details  Name: Sharon Chan MRN: [id number] Date of Birth: 01/11/1976  Date of referral:  02/28/17               Reason for consult:  Discharge Planning                Permission sought to share information with:  Facility Sport and exercise psychologist, Family Supports Permission granted to share information::  Yes, Verbal Permission Granted  Name::     Brylyn Novakovich  Agency::  SNFs  Relationship::  Mother  Contact Information:  [phone]  Housing/Transportation Living arrangements for the past 2 months:  Apartment Source of Information:  Patient Patient Interpreter Needed:  None Criminal Activity/Legal Involvement Pertinent to Current Situation/Hospitalization:  No - Comment as needed Significant Relationships:  Other Family Members, Parents Lives with:  Self Do you feel safe going back to the place where you live?  Yes Need for family participation in patient care:  Yes (Comment)  Care giving concerns:  No caregiving concerns identified.    Social Worker assessment / plan:  CSW met with pt to address consult for new SNF as backup to CIR. Mom-Jackie and cousin-Geraldine at bedside. CSW introduced herself and explained role of social work. Pt from home and lives alone.  CSW explained SNF is backup to CIR. CIR is pt's preference. CSW provided SNF listing for review. Pt agreeable to SNF for STR as backup.     CSW sent FL-2 to SNFs and will follow up on potential bed offer. CSW will continue to follow.   Employment status:  Disabled (Comment on whether or not currently receiving Disability) Insurance information:  Medicare, Medicaid In [place] PT Recommendations:  Inpatient Rehab Consult Information / Referral to community resources:  [place]  Patient/Family's Response to care:  Pt and family appreciative for CSW support. Pt was engaged during assessment.   Patient/Family's Understanding of and Emotional Response to  Diagnosis, Current Treatment, and Prognosis:  Pt understands she will benefit from STR prior to returning home and is accepting of her diagnosis, current treatment plan, and prognosis.    Emotional Assessment Appearance:  Appears stated age Attitude/Demeanor/Rapport:  Other (Appropriate) Affect (typically observed):  Accepting, Adaptable, Pleasant Orientation:  Oriented to Self, Oriented to Place, Oriented to  Time, Oriented to Situation Alcohol / Substance use:  Other Psych involvement (Current and /or in the community):  No (Comment)  Discharge Needs  Concerns to be addressed:  Care Coordination Readmission within the last 30 days:  No Current discharge risk:  Lives alone, Dependent with Mobility, Chronically ill Barriers to Discharge:  Continued Medical Work up   [employer], LCSW 02/28/2017, 8:54 PM

## 2017-02-28 NOTE — Progress Notes (Signed)
I met with pt and her Mom at bedside to discuss a possible inpt rehab admission pending medical workup completion and bed availability. They are in agreement. I will follow . [phone]

## 2017-02-28 NOTE — Patient Instructions (Signed)
Knee Extension: Resisted (Sitting)    With band looped around right ankle and under other foot, straighten leg with ankle loop. Keep other leg bent to increase resistance. Repeat ____ times per set. Do ____ sets per session. Do ____ sessions per day.  http://orth.exer.us/690   Copyright  VHI. All rights reserved.  EXTENSION: Sitting - Resistance Band (Active)    Sit with feet flat. Against yellow resistance band, straighten right knee. Complete ___ sets of ___ repetitions. Perform ___ sessions per day.  Copyright  VHI. All rights reserved.  ADDUCTION: Isometric    With ball between knees, squeeze them inward. Hold ___ seconds. Complete ___ sets of ___ repetitions. Perform ___ sessions per day.  http://gtsc.exer.us/124   Copyright  VHI. All rights reserved.  EXTENSION: Sitting (Manual Resistance)    Sit with right foot raised slightly, toes pointed toward head. Against resistance, point toes downward. Complete ___ sets of ___ repetitions. Perform ___ sessions per day.  Copyright  VHI. All rights reserved.  FLEXION: Sitting (Active)    Sit, both feet flat. Lift right knee toward ceiling. Use ___ lbs. Complete ___ sets of ___ repetitions. Perform ___ sessions per day.  Copyright  VHI. All rights reserved.

## 2017-02-28 NOTE — Progress Notes (Signed)
Physical Therapy Treatment Patient Details Name: ERIONA KINCHEN MRN: [id number] DOB: 09-07-1976 Today's Date: 02/28/2017    History of Present Illness Pt is a 41 y.o. female who presented to the ED with R LE weakness, R UE weakness and numbness/tingling, and R facial numbness. 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 MRI revealed acute infarct in L thalamus. Pt with PMH significant for polycystic disease (ovaries), and sickle cell disease.    PT Comments    Pt progressing towards physical therapy goals. Was able to progress gait training this session with RW splint provided by OT for R hand maintenance on walker. Seated rest required due to pain and fatigue, however pt highly motivated to participate with therapy, and feel she continues to be an excellent candidate for CIR admission at d/c.  Will continue to follow.   Follow Up Recommendations  CIR     Equipment Recommendations  None recommended by PT    Recommendations for Other Services Rehab consult     Precautions / Restrictions Precautions Precautions: Fall Restrictions Weight Bearing Restrictions: No Other Position/Activity Restrictions: R-sided weakness    Mobility  Bed Mobility Overal bed mobility: Needs Assistance Bed Mobility: Supine to Sit     Supine to sit: Min assist     General bed mobility comments: Pt was received sitting in the chair.  Transfers Overall transfer level: Needs assistance Equipment used: Rolling walker (2 wheeled) Transfers: Sit to/from Stand Sit to Stand: Min assist         General transfer comment: VC's for hand placement on seated surface for safety  Ambulation/Gait Ambulation/Gait assistance: Min guard;+2 safety/equipment Ambulation Distance (Feet): 150 Feet (100', seated rest, 50' ) Assistive device: Rolling walker (2 wheeled) Gait Pattern/deviations: Step-through pattern;Decreased stride length;Decreased dorsiflexion - right;Decreased  dorsiflexion - left;Decreased weight shift to right;Trunk flexed Gait velocity: decreased Gait velocity interpretation: Below normal speed for age/gender General Gait Details: VC's for improved posture, increased heel strike bilaterally, and for straight path down hall. Pt drifting to the L but able to recognize and correct. Pt required x1 seated rest break due to pain/fatigue, and after second bout of walking, was not able to make it back to the room, and therapist transported pt back in recliner the last 50'. OT provided pt with a splint for RW, and pt did not have difficulty keeping the R hand on the walker.    Stairs            Wheelchair Mobility    Modified Rankin (Stroke Patients Only) Modified Rankin (Stroke Patients Only) Pre-Morbid Rankin Score: No symptoms Modified Rankin: Moderately severe disability     Balance Overall balance assessment: Needs assistance Sitting-balance support: No upper extremity supported;Feet supported Sitting balance-Leahy Scale: Fair     Standing balance support: Bilateral upper extremity supported Standing balance-Leahy Scale: Poor Standing balance comment: Reliant on RW for stability                             Cognition Arousal/Alertness: Awake/alert Behavior During Therapy: WFL for tasks assessed/performed Overall Cognitive Status: Impaired/Different from baseline Area of Impairment: Problem solving;Memory                     Memory: Decreased short-term memory       Problem Solving: Slow processing;Difficulty sequencing General Comments: Completed MOCA with pt. Pt scored 22/30 (with 26/30 representing normal score). Greatest difficulty with visuospatial  and executive functioning as well as delayed recall. SLight difficulty with serial 7's and pt reports that "numbers are harder to figrure out."      Exercises General Exercises - Lower Extremity Ankle Circles/Pumps: 10 reps Long Arc Quad: 10 reps Hip  ABduction/ADduction: 10 reps Hip Flexion/Marching: 10 reps;Seated    General Comments        Pertinent Vitals/Pain Pain Assessment: Faces Faces Pain Scale: Hurts a little bit Pain Location: head Pain Descriptors / Indicators: Aching Pain Intervention(s): Limited activity within patient's tolerance;Monitored during session;Repositioned    Home Living                      Prior Function            PT Goals (current goals can now be found in the care plan section) Acute Rehab PT Goals Patient Stated Goal: to get back to normal PT Goal Formulation: With patient Time For Goal Achievement: 03/12/17 Potential to Achieve Goals: Good Progress towards PT goals: Progressing toward goals    Frequency    Min 4X/week      PT Plan Current plan remains appropriate    Co-evaluation             End of Session Equipment Utilized During Treatment: Gait belt Activity Tolerance: Patient tolerated treatment well Patient left: in chair;with call bell/phone within reach Nurse Communication: Mobility status PT Visit Diagnosis: Hemiplegia and hemiparesis;Muscle weakness (generalized) (M62.81);Unsteadiness on feet (R26.81) Hemiplegia - Right/Left: Right Hemiplegia - dominant/non-dominant: Non-dominant Hemiplegia - caused by: Cerebral infarction Pain - Right/Left: Right Pain - part of body:  (Generalized R-sided pain and headache)     Time: 1610-9604 PT Time Calculation (min) (ACUTE ONLY): 28 min  Charges:  $Gait Training: 8-22 mins $Therapeutic Exercise: 8-22 mins                    G Codes:       Conni Slipper, PT, DPT Acute Rehabilitation Services Pager: [phone]    Marylynn Pearson 02/28/2017, 12:58 PM

## 2017-02-28 NOTE — Clinical Social Work Placement (Signed)
   CLINICAL SOCIAL WORK PLACEMENT  NOTE  Date:  02/28/2017  Patient Details  Name: Sharon Chan MRN: [id number] Date of Birth: 01-26-1976  Clinical Social Work is seeking post-discharge placement for this patient at the Skilled  Nursing Facility level of care (*CSW will initial, date and re-position this form in  chart as items are completed):  Yes   Patient/family provided with [place] Clinical Social Work Department's list of facilities offering this level of care within the geographic area requested by the patient (or if unable, by the patient's family).  Yes   Patient/family informed of their freedom to choose among providers that offer the needed level of care, that participate in Medicare, Medicaid or managed care program needed by the patient, have an available bed and are willing to accept the patient.  Yes   Patient/family informed of [facility]'s ownership interest in [facility] and [facility], as well as of the fact that they are under no obligation to receive care at these facilities.  PASRR submitted to EDS on 02/28/17     PASRR number received on 02/28/17     Existing PASRR number confirmed on       FL2 transmitted to all facilities in geographic area requested by pt/family on 02/28/17     FL2 transmitted to all facilities within larger geographic area on       Patient informed that his/her managed care company has contracts with or will negotiate with certain facilities, including the following:            Patient/family informed of bed offers received.  Patient chooses bed at       Physician recommends and patient chooses bed at      Patient to be transferred to   on  .  Patient to be transferred to facility by       Patient family notified on   of transfer.  Name of family member notified:        PHYSICIAN Please sign FL2     Additional Comment:    _______________________________________________ Dominic Pea, LCSW 02/28/2017,  8:59 PM

## 2017-02-28 NOTE — Progress Notes (Signed)
Pt sat up in chair during breakfast and lunch.

## 2017-02-28 NOTE — Progress Notes (Addendum)
Occupational Therapy Treatment [name] MRN: [id number] DOB: September 29, 1976 Today's Date: 02/28/2017    History of present illness Pt is a 41 y.o. female who presented to the ED with R LE weakness, R UE weakness and numbness/tingling, and R facial numbness. 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 MRI revealed acute infarct in L thalamus. Pt with PMH significant for polycystic disease (ovaries), and sickle cell disease.   OT comments  Pt progressing toward OT goals. She reports worsening vision to R side with light-sensitivity this session. With confrontation testing, pt demonstrated R visual field deficit in R superior and inferior temporal fields. Educated pt on compensatory strategies for vision this session including anchoring, isolating lines of text, and utilizing head turns. Additionally discussed having family approach her on the L. Pt continues to demonstrate inattention to R side of body and visual field. Completed MOCA Version 7.1 with pt scoring 22/30 with greatest deficits in delayed recall and visuospatial/executive functioning. Educated pt on compensatory strategies for memory and updated goals for higher level cognition. Lastly initiated HEP for R UE lap slides and composite flexion/extension and pt demonstrates understanding. Provided R walker splint to improve functional use of RW during ADL as pt unable to control with R hand this session and educated on skin checks. D/C plan remains appropriate and OT will continue to follow acutely.   Follow Up Recommendations  CIR;Supervision/Assistance - 24 hour    Equipment Recommendations  Other (comment) (TBD at next venue)    Recommendations for Other Services Rehab consult    Precautions / Restrictions Precautions Precautions: Fall Restrictions Weight Bearing Restrictions: No Other Position/Activity Restrictions: R-sided weakness       Mobility Bed  Mobility Overal bed mobility: Needs Assistance Bed Mobility: Supine to Sit     Supine to sit: Min assist     General bed mobility comments: Min A for RLE management during bed mobility.   Transfers Overall transfer level: Needs assistance Equipment used: Rolling walker (2 wheeled) Transfers: Sit to/from Stand Sit to Stand: Min assist         General transfer comment: VC's for proper hand placement and unable to maintain R hand on RW.    Balance Overall balance assessment: Needs assistance Sitting-balance support: No upper extremity supported;Feet supported Sitting balance-Leahy Scale: Fair     Standing balance support: Bilateral upper extremity supported Standing balance-Leahy Scale: Poor Standing balance comment: Reliant on RW for stability                            ADL either performed or assessed with clinical judgement   ADL Overall ADL's : Needs assistance/impaired                         Toilet Transfer: Minimal assistance;RW;Ambulation Statistician Details (indicate cue type and reason): Difficulty controlling RW with R UE with decreased active movement this session as compared to eval. Provided R sided RW splint and educated pt on use. She verbalizes understanding. Educated on need to perform skin checks when utilizing to prevent skin breakdown.           General ADL Comments: Pt reporting worsening vision on R side this session and decreased functional use of R hand. Provided HEP for R UE including lap slides (pronated and with wrist neutral) as well as composite flexion/extension. Pt able to complete 5 repetitions  of AAROM in shoulder flexion, horizontal abduction/adduction, elbow flexion, forearm supination/pronation, and wrist flexion/extension.     Vision   Vision Assessment?: Yes Eye Alignment: Within Functional Limits Ocular Range of Motion: Within Functional Limits Alignment/Gaze Preference: Within Defined Limits Visual  Fields: Right visual field deficit Additional Comments: Pt with slight inattention to R side with line bisection tasks and visual scanning sheets. Educated on use of anchoring techniques and isolating lines of text to improve scanning. Pt with R visual field deficit in R temporal field superiorly and inferiorly. Educated on lighthouse strategy when entering new environment and to notify family to approach her from the L.   Perception     Praxis      Cognition Arousal/Alertness: Awake/alert Behavior During Therapy: WFL for tasks assessed/performed Overall Cognitive Status: Impaired/Different from baseline Area of Impairment: Problem solving;Memory                     Memory: Decreased short-term memory       Problem Solving: Slow processing;Difficulty sequencing General Comments: Completed MOCA with pt. Pt scored 22/30 (with 26/30 representing normal score). Greatest difficulty with visuospatial and executive functioning as well as delayed recall. SLight difficulty with serial 7's and pt reports that "numbers are harder to figrure out."        Exercises     Shoulder Instructions       General Comments      Pertinent Vitals/ Pain       Pain Assessment: Faces Faces Pain Scale: Hurts a little bit Pain Location: head Pain Descriptors / Indicators: Aching Pain Intervention(s): Monitored during session;Repositioned  Home Living                                          Prior Functioning/Environment              Frequency  Min 2X/week        Progress Toward Goals  OT Goals(current goals can now be found in the care plan section)  Progress towards OT goals: Progressing toward goals  Acute Rehab OT Goals Patient Stated Goal: to get back to normal OT Goal Formulation: With patient Time For Goal Achievement: 03/12/17 Potential to Achieve Goals: Good ADL Goals Pt Will Perform Eating: with modified independence;with adaptive utensils Pt  Will Perform Grooming: with supervision;sitting;with adaptive equipment (bimanual tasks) Pt Will Perform Upper Body Dressing: with modified independence;sitting Pt Will Perform Lower Body Dressing: with supervision;sit to/from stand Pt Will Transfer to Toilet: ambulating;bedside commode;with modified independence Pt/caregiver will Perform Home Exercise Program: Right Upper extremity;Increased strength;Increased ROM;With written HEP provided;Independently Additional ADL Goal #1: Pt will demonstrate improved problem solving skills to complete simple financial management tasks to improve independence with IADL tasks.  Plan Discharge plan remains appropriate    Co-evaluation                 End of Session Equipment Utilized During Treatment: Gait belt;Rolling walker  OT Visit Diagnosis: Hemiplegia and hemiparesis Hemiplegia - Right/Left: Right Hemiplegia - dominant/non-dominant: Non-Dominant Hemiplegia - caused by: Cerebral infarction   Activity Tolerance Patient tolerated treatment well   Patient Left in chair;with call bell/phone within reach;with chair alarm set   Nurse Communication Mobility status        Time: 0903-1000 OT Time Calculation (min): 57 min  Charges: OT General Charges $OT Visit: 1 Procedure OT Treatments $Self Care/Home  Management : 8-22 mins $Therapeutic Activity: 38-52 mins  Doristine Section, MS OTR/L  Pager: [phone]     Rhegan Trunnell A Evola Hollis 02/28/2017, 10:16 AM

## 2017-02-28 NOTE — Progress Notes (Signed)
STROKE TEAM PROGRESS NOTE   HISTORY OF PRESENT ILLNESS (per record) Sharon Chan is a 41 y.o. female who states that yesterday she had some numbness in the right leg around the knee causing her leg to buckle.  She thought it was because she was walking too much and ignored it.  This morning she woke up at 8 am feeling OK overall except for some possible mild right face and hand numbness. She got out of bed at 11 am and then noticed that she has significant right sided numbness and tingling.  Upon arrival to the ER she had very high BP.  No prior history of hypertension.  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  There was a hypodensity in the left thalamus of unclear age.  CTA Brain showed multiple intracranial stenosis which could be [place] disease related vs other differentials such as CNS vasculitis.  CTA Neck is normal.  She was a vague historian and her neurological was bizarre in presentation as it was associated with confusion and encephalopathy.  Therefore, I did a stat MRI Brain which showed an acute infarct in the left thalamus.  MRA showed similar beading of intracranial vessels. She was not taking any medications at home, including no antiplatelets.  She had not had any major sickle cell crisis for many years.    She has no known hyperlipidemia, hypertension, diabetes, and she is not a smoker.  She denies illicit drug use.   Patient received total of 30 mg IV Labetolol but her BP remained outside of the parameters for IV tPA.     SUBJECTIVE (INTERVAL HISTORY)  .  She states She has subjective improvement of her right-sided strength as well as new left hand and arm pain .Suspect Dr. Ashley Royalty. She cannot find any record of patient history of sickle cell disease in the past and peripheral blood smear appears unremarkable. Hemoglobin S level is not back yet  OBJECTIVE Temp:  [97.8 F (36.6 C)-99 F (37.2 C)] 97.8 F (36.6 C) (04/17 1218) Pulse  Rate:  [67-94] 67 (04/17 1218) Cardiac Rhythm: Normal sinus rhythm (04/17 0741) Resp:  [18-19] 18 (04/17 1218) BP: (117-163)/(68-108) 153/87 (04/17 1218) SpO2:  [94 %-100 %] 100 % (04/17 0900)  CBC:   Recent Labs Lab 02/25/17 1310 02/25/17 1834 02/27/17 1036  WBC 8.0 8.3 12.5*  NEUTROABS 3.5  --  9.5*  HGB 13.1 13.3 12.9  HCT 39.4 39.8 39.2  MCV 76.2* 75.2* 76.1*  PLT 297 303 287    Basic Metabolic Panel:   Recent Labs Lab 02/25/17 1310 02/25/17 1834 02/27/17 1036  NA 140  --  137  K 4.0  --  3.7  CL 104  --  101  CO2 26  --  26  GLUCOSE 134*  --  153*  BUN 10  --  16  CREATININE 1.08* 0.91 0.91  CALCIUM 9.2  --  9.3  MG  --  2.0  --     Lipid Panel:     Component Value Date/Time   CHOL 199 02/26/2017 0341   TRIG 134 02/26/2017 0341   HDL 37 (L) 02/26/2017 0341   CHOLHDL 5.4 02/26/2017 0341   VLDL 27 02/26/2017 0341   LDLCALC 135 (H) 02/26/2017 0341   HgbA1c:  Lab Results  Component Value Date   HGBA1C 6.3 (H) 02/26/2017   Urine Drug Screen:     Component Value Date/Time   LABOPIA POSITIVE (A) 02/26/2017  0147   COCAINSCRNUR NONE DETECTED 02/26/2017 0147   LABBENZ NONE DETECTED 02/26/2017 0147   AMPHETMU NONE DETECTED 02/26/2017 0147   THCU NONE DETECTED 02/26/2017 0147   LABBARB NONE DETECTED 02/26/2017 0147    Alcohol Level No results found for: ETH  IMAGING  Ct Angio Head W Or Wo Contrast Ct Angio Neck W And/or Wo Contrast 02/25/2017 1. No atherosclerosis or stenosis in the neck, ICA siphons, or basilar artery. 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 Still, there is no emergent large vessel occlusion or target for endovascular intervention identified.  2. Moderate or severe stenoses of the: - Left PCA P1 and P2, - Right MCA origin and M1, - Left ACA A2 and bilateral distal pericallosal arteries, - Left MCA M2 and M3.  3. Left thyroid 18 mm nodule meets consensus criteria for nonemergent  follow-up   Thyroid Ultrasound characterization.    Dg Chest 2 View 02/25/2017 No active cardiopulmonary disease.  Mild left basilar atelectasis.    Mr Maxine Glenn Head Wo Contrast 02/25/2017 1. 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  2. Remote infarcts involving the basal ganglia bilaterally.  3. Scattered white matter changes bilaterally are mildly advanced for age. This likely also reflects the sequela of microvascular ischemia or vasculitis.  4. Remote infarcts involving the body of the corpus callosum.  5. Extensive medium and small vessel disease evident on the MRA as described.    Ct Head Code Stroke W/o Cm 02/25/2017 1. No acute cortically based infarct or acute intracranial hemorrhage. ASPECTS is 10.  2. Evidence of scattered small vessel ischemia, with chronic appearing involvement of the corpus callosum and right basal ganglia. Small age indeterminate area in the left thalamus.     PHYSICAL EXAM Young obese african [place] lady not in distress. . Afebrile. Head is nontraumatic. Neck is supple without bruit.    Cardiac exam no murmur or gallop. Lungs are clear to auscultation. Distal pulses are well felt.  Neurological Exam :  Awake alert oriented x3. Normal speech and language. No dysarthria or aphasia. Fundi not visualized. Extraocular movements are full range without nystagmus. Blinks to threat bilaterally. Vision acuity and fields appear norma Right lower facial weakness. Tongue midline. Motor exam shows mild right hemiparesis with mild right upper extremity drift. Weakness of right grip and intrinsic hand muscles Fine finger movements are diminished on right. Orbits left over right upper extremity. Diminished right hemibody sensation including lower face.gait deferred. Coordination diminished on right. Rt Plantar is upgoing and left is downgoing.     ASSESSMENT/PLAN Sharon Chan is a 41 y.o. female with history of sickle  cell disease and polycystic disease of the ovaries presenting with right sided numbness and weakness in the setting of severe hypertension 230/110. She did not receive IV t-PA due to uncontrolled hypertension and unclear time of initial onset.    Strokes: acute nonhemorrhagic infarcts involving the left thalamus secondary to small vessel disease versus ?Sickle-cell vasculopathy  Resultant  Right hemibody sensory  and mild right hemiparesis  MRI - Acute nonhemorrhagic infarcts involving the left thalamus. Multiple remote infarcts.  MRA - Extensive medium and small vessel disease evident on the MRA.  CTA H&N - widespread cerebrovascular disease.  Carotid Doppler - CTA neck  2D Echo - pending  LDL - 135  UDS - pending  [id number] pending  VTE prophylaxis - subcutaneous heparin Diet regular Room service appropriate? Yes; Fluid  consistency: Thin  No antithrombotic prior to admission, now on aspirin 325 mg daily  Patient counseled to be compliant with her antithrombotic medications  Ongoing aggressive stroke risk factor management  Therapy recommendations:  CIR recommended. MD consult ordered.  Disposition: Pending  Hypertension  Stable - 151/91  Permissive hypertension (OK if < 220/120) but gradually normalize in 5-7 days  Long-term BP goal normotensive  Hyperlipidemia  Home meds: No lipid lowering medications prior to admission  LDL 135, goal < 70   Lipitor 40 mg daily ( may need to increase to 80 mg daily )  Continue statin at discharge    Other Stroke Risk Factors  Obesity, Body mass index is 40.14 kg/m., recommend weight loss, diet and exercise as appropriate   Hx stroke/TIA - by imaging    Other Active Problems  Left thyroid 18 mm nodule meets consensus criteria for nonemergent follow-up   May need to consider dual antiplatelet therapy secondary to widespread cerebrovascular disease.  Hospital day # 3   Hemoglobin S levels   are pending. Long  discussion with patient and Dr. Ashley Royalty about treatment. Agree with not doing partial exchange transfusion until definite documentation of her sickle disease is confirmed  . Check lab work for vasculitic ,autoimmune and infectious conditions.She voiced understanding. greater than 50 % time during this 25 minute visit was spent on counselling and ccordination of care about her stroke, sickle cell vasculopathy and crisis, stroke risk and prevention discussion and answering questions. Delia Heady, MD Medical Director [facility] Pager: [phone] 02/28/2017 2:03 PM To contact Stroke Continuity provider, please refer to [email or web address]. After hours, contact General Neurology

## 2017-02-28 NOTE — Progress Notes (Signed)
SICKLE CELL SERVICE PROGRESS NOTE  KAMBRI DISMORE ZOX:[id number] DOB: 1976/01/05 DOA: 02/25/2017 PCP: Unknown  Assessment/Plan: Principal Problem:   Stroke (cerebrum) (HCC) Active Problems:   Sickle cell disease (HCC)   Stroke (HCC)   Acute ischemic stroke (HCC)   Dysphagia, post-stroke   Benign essential HTN   Polycystic kidney disease   Prediabetes   Nonintractable headache   Leukocytosis  1. 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 The genotype and severity of her disease is unknown at this time. The CVA is in a region more consistent with hypertensive CVA than Sickle Cell Vasculopathy. The electrophoresis is still pending to evaluate Hb percentages. The smear reviewed by Pathology showed a normal smear without any sickle cells. I will hold on any transfusions as given that the smear does not show a large burden of Sickle Cells I am not sure that the benefits outweighs the risk of an exchange transfusion. Continue ASA and Statin.  2. Impaired Vision: Pt c/o decreased vision in both eyes with loss of Peripheral vision on the right and waxing and waning vision on the right. 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  3. Sickle Cell Crisis: Pt continues to c/o pain. The electrophoresis is still pending but Will schedule Toradol and add oxycodone 5 mg every 4 hours as needed. Re-evaluate tomorrow.  4. Polycystic Ovarian Syndrome: Pt had been on Spironolactone and stopped it after she discontinued her care with her most recent PMD. 5. Sickle Cell Intra-Cranial Vasculopathy: She does not have moya-moya per MRA. However evidence of previous CVA suggest that she may require chronic red cell pheresis vs Hydrea for secondary prevention of CVA if she does have Sickle Cell  Anemia. 6.  Thyroid nodule: Thyroid nodule seen on CTA of neck.TSH normal. Pt  will follow-up as out patient.  7. Medical Adherence: Pt does not have a Primary Care Provider and will need to establish care upon discharge from Rehab.    Code Status: Full Code Family Communication: N/A Disposition Plan: Anticipate discharge to Rehab tomorrow.      [name],[name]  Pager [phone]. If 7PM-7AM, please contact night-coverage.  02/28/2017, 6:13 PM  LOS: 3 days   Interim History: Pt reports that she feels that her RLE is stronger than it was yesterday. She also c/o pain in the LUE, and B/L hips which is characteristic of Sickle Cell Crisis.    Consultants:  Neurology  Procedures:  None  Antibiotics:  None   Objective: Vitals:   02/28/17 0509 02/28/17 0900 02/28/17 1218 02/28/17 1610  BP: 117/68 (!) 163/96 (!) 153/87 (!) 159/98  Pulse: 78 88 67 87  Resp: 18 18 18 20   Temp: 98.7 F (37.1 C) 98.6 F (37 C) 97.8 F (36.6 C) 98.5 F (36.9 C)  TempSrc: Oral Oral Oral Oral  SpO2: 96% 100% 100% 96%  Weight:      Height:       Weight change:  No intake or output data in the 24 hours ending 02/28/17 1813  General: Alert, awake, oriented x3, in no acute distress.  HEENT: [place]/AT PEERL, EOMI, anicteric Neck: Trachea midline,  no masses, no thyromegal,y no JVD, no carotid bruit OROPHARYNX:  Moist, No exudate, erythema/lesions.  Heart: Regular rate and rhythm, without murmurs, rubs, gallops, PMI non-displaced, no heaves  or thrills on palpation.  Lungs: Clear to auscultation, no wheezing or rhonchi noted. No increased vocal fremitus resonant to percussion. Abdomen: Soft, nontender, nondistended, positive bowel sounds, no masses no hepatosplenomegaly noted.  Neuro: mild slurring of speech and tongue deviation to the left. She is able to move RUE partialy through the range against gravity.  Musculoskeletal: No warmth swelling or erythema around joints, no spinal tenderness  noted. Psychiatric: Patient alert and oriented x3, good insight and cognition, good recent to remote recall.    Data Reviewed: Basic Metabolic Panel:  Recent Labs Lab 02/25/17 1307 02/25/17 1310 02/25/17 1834 02/27/17 1036  NA 142 140  --  137  K 3.6 4.0  --  3.7  CL 106 104  --  101  CO2  --  26  --  26  GLUCOSE 124* 134*  --  153*  BUN 13 10  --  16  CREATININE 1.00 1.08* 0.91 0.91  CALCIUM  --  9.2  --  9.3  MG  --   --  2.0  --    Liver Function Tests:  Recent Labs Lab 02/25/17 1310  AST 20  ALT 19  ALKPHOS 76  BILITOT 0.3  PROT 6.4*  ALBUMIN 3.6   No results for input(s): LIPASE, AMYLASE in the last 168 hours. No results for input(s): AMMONIA in the last 168 hours. CBC:  Recent Labs Lab 02/25/17 1307 02/25/17 1310 02/25/17 1834 02/27/17 1036  WBC  --  8.0 8.3 12.5*  NEUTROABS  --  3.5  --  9.5*  HGB 14.6 13.1 13.3 12.9  HCT 43.0 39.4 39.8 39.2  MCV  --  76.2* 75.2* 76.1*  PLT  --  297 303 287   Cardiac Enzymes: No results for input(s): CKTOTAL, CKMB, CKMBINDEX, TROPONINI in the last 168 hours. BNP (last 3 results) No results for input(s): BNP in the last 8760 hours.  ProBNP (last 3 results) No results for input(s): PROBNP in the last 8760 hours.  CBG: No results for input(s): GLUCAP in the last 168 hours.  No results found for this or any previous visit (from the past 240 hour(s)).   Studies: Ct Angio Head W Or Wo Contrast  Addendum Date: 02/25/2017   ADDENDUM REPORT: 02/25/2017 14:30 ADDENDUM: Preliminary report of this exam was discussed by telephone with Dr. Johnn Hai on 02/25/2017 at 1343 hours. Electronically Signed   By: Odessa Fleming M.D.   On: 02/25/2017 14:30   Result Date: 02/25/2017 CLINICAL DATA:  41 year old female with RIGHT side weakness. Confusion. Sickle cell disease. EXAM: CT ANGIOGRAPHY HEAD AND NECK TECHNIQUE: Multidetector CT imaging of the head and neck was performed using the standard protocol during bolus administration of  intravenous contrast. Multiplanar CT image reconstructions and MIPs were obtained to evaluate the vascular anatomy. Carotid stenosis measurements (when applicable) are obtained utilizing NASCET criteria, using the distal internal carotid diameter as the denominator. CONTRAST:  100 mL Isovue 370 COMPARISON:  Head CT without contrast 1259 hours today. FINDINGS: CTA NECK Skeleton: No acute osseous abnormality identified. Cervical and thoracic endplates are relatively preserved. Visualized paranasal sinuses and mastoids are stable and well pneumatized. Upper chest: Partially visible cardiomegaly. Negative visualized thoracic aorta. No superior mediastinal lymphadenopathy. Negative visible lung parenchyma except for atelectasis. Other neck: Thyroid remarkable for 18 mm hypodense nodule on the left. Negative larynx, pharynx, parapharyngeal spaces, retropharyngeal space (partially retropharyngeal right carotid), sublingual space, submandibular glands and parotid glands. No cervical lymphadenopathy. Aortic arch: 3 vessel arch configuration, no  atherosclerosis. Enhancement of small venous collaterals about the right shoulder. 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 Right carotid system: Mildly tortuous brachiocephalic artery with no stenosis. Negative proximal right CCA. Partially retropharyngeal right carotid bifurcation. Negative cervical right ICA aside from mild tortuosity. Left carotid system: Negative. Vertebral arteries:No proximal right subclavian stenosis. Non dominant right vertebral artery. Right vertebral artery origin and cervical right vertebral arteries are within normal limits. No proximal left subclavian artery stenosis. Dominant left vertebral artery. No left vertebral origin stenosis. Tortuous left V1 segment. Mildly tortuous left V2 segment, no stenosis to the skullbase. CTA HEAD Posterior circulation: Distal left vertebral  artery is dominant. No distal vertebral stenosis. Both PICA origins are patent. No basilar stenosis. Normal SCA origins. Normal left PCA origin. Fetal type right PCA. Left posterior communicating artery is present. Mild right P1 and P2 and moderate to severe left P1 and P2, irregularity and stenosis as seen on series 407, image 52. Preserved bilateral distal PCA flow. Anterior circulation: Both ICA siphons are patent without atherosclerosis or stenosis. Normal ophthalmic and posterior communicating artery origins. Patent carotid termini. However, there is moderate irregularity and stenosis at the right M1 origin (series 403, image 108). The right MCA M1 segment is patent but there is moderate to severe irregularity and stenosis also in the mid M1 (series 403, image 103 and see also series 407, image 52). Despite this the right MCA bifurcation is patent. No right M2 branch occlusion identified. Right MCA M2 and M3 branches are within normal limits. Left MCA origin and proximal M1 segment are normal. There is mild irregularity in the mid left M1 segment. The left MCA bifurcation is patent. There is moderate left M2 and moderate to severe left M3 irregularity and stenosis as seen on series 406, image 32. ACA origins and anterior communicating arteries are within normal limits. Proximal A2 branches are within normal limits but there is severe left ACA A2 segment stenosis near the callosomarginal artery, and moderate to severe bilateral distal ACA/pericallosal artery irregularity and stenosis (series 406, image 25). Venous sinuses: Patent. Anatomic variants: Dominant left vertebral artery. Fetal type right PCA origin. Review of the MIP images confirms the above findings IMPRESSION: 1. No atherosclerosis or stenosis in the neck, ICA siphons, or basilar artery. 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 Still, there is no emergent large vessel occlusion or target for  endovascular intervention identified. 2. Moderate or severe stenoses of the: - Left PCA P1 and P2, - Right MCA origin and M1, - Left ACA A2 and bilateral distal pericallosal arteries, - Left MCA M2 and M3. 3. Left thyroid 18 mm nodule meets consensus criteria for nonemergent follow-up Thyroid Ultrasound characterization. Electronically Signed: By: Odessa Fleming M.D. On: 02/25/2017 13:57   Dg Chest 2 View  Result Date: 02/25/2017 CLINICAL DATA:  Right side numbness, facial droop, history of sickle cell disease EXAM: CHEST  2 VIEW COMPARISON:  05/07/2006 FINDINGS: Borderline cardiomegaly. Right IJ Port-A-Cath with tip in upper SVC is unchanged in position. No acute infiltrate or pulmonary edema. Mild left basilar atelectasis. Mild degenerative changes mid thoracic spine. IMPRESSION: No active cardiopulmonary disease.  Mild left basilar atelectasis. Electronically Signed   By: Natasha Mead M.D.   On: 02/25/2017 17:19   Ct Angio Neck W And/or Wo Contrast  Addendum Date: 02/25/2017   ADDENDUM REPORT: 02/25/2017 14:30 ADDENDUM: Preliminary  report of this exam was discussed by telephone with Dr. Johnn Hai on 02/25/2017 at 1343 hours. Electronically Signed   By: Odessa Fleming M.D.   On: 02/25/2017 14:30   Result Date: 02/25/2017 CLINICAL DATA:  41 year old female with RIGHT side weakness. Confusion. Sickle cell disease. EXAM: CT ANGIOGRAPHY HEAD AND NECK TECHNIQUE: Multidetector CT imaging of the head and neck was performed using the standard protocol during bolus administration of intravenous contrast. Multiplanar CT image reconstructions and MIPs were obtained to evaluate the vascular anatomy. Carotid stenosis measurements (when applicable) are obtained utilizing NASCET criteria, using the distal internal carotid diameter as the denominator. CONTRAST:  100 mL Isovue 370 COMPARISON:  Head CT without contrast 1259 hours today. FINDINGS: CTA NECK Skeleton: No acute osseous abnormality identified. Cervical and thoracic endplates  are relatively preserved. Visualized paranasal sinuses and mastoids are stable and well pneumatized. Upper chest: Partially visible cardiomegaly. Negative visualized thoracic aorta. No superior mediastinal lymphadenopathy. Negative visible lung parenchyma except for atelectasis. Other neck: Thyroid remarkable for 18 mm hypodense nodule on the left. Negative larynx, pharynx, parapharyngeal spaces, retropharyngeal space (partially retropharyngeal right carotid), sublingual space, submandibular glands and parotid glands. No cervical lymphadenopathy. Aortic arch: 3 vessel arch configuration, no atherosclerosis. Enhancement of small venous collaterals about the right shoulder. 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 Right carotid system: Mildly tortuous brachiocephalic artery with no stenosis. Negative proximal right CCA. Partially retropharyngeal right carotid bifurcation. Negative cervical right ICA aside from mild tortuosity. Left carotid system: Negative. Vertebral arteries:No proximal right subclavian stenosis. Non dominant right vertebral artery. Right vertebral artery origin and cervical right vertebral arteries are within normal limits. No proximal left subclavian artery stenosis. Dominant left vertebral artery. No left vertebral origin stenosis. Tortuous left V1 segment. Mildly tortuous left V2 segment, no stenosis to the skullbase. CTA HEAD Posterior circulation: Distal left vertebral artery is dominant. No distal vertebral stenosis. Both PICA origins are patent. No basilar stenosis. Normal SCA origins. Normal left PCA origin. Fetal type right PCA. Left posterior communicating artery is present. Mild right P1 and P2 and moderate to severe left P1 and P2, irregularity and stenosis as seen on series 407, image 52. Preserved bilateral distal PCA flow. Anterior circulation: Both ICA siphons are patent without atherosclerosis or  stenosis. Normal ophthalmic and posterior communicating artery origins. Patent carotid termini. However, there is moderate irregularity and stenosis at the right M1 origin (series 403, image 108). The right MCA M1 segment is patent but there is moderate to severe irregularity and stenosis also in the mid M1 (series 403, image 103 and see also series 407, image 52). Despite this the right MCA bifurcation is patent. No right M2 branch occlusion identified. Right MCA M2 and M3 branches are within normal limits. Left MCA origin and proximal M1 segment are normal. There is mild irregularity in the mid left M1 segment. The left MCA bifurcation is patent. There is moderate left M2 and moderate to severe left M3 irregularity and stenosis as seen on series 406, image 32. ACA origins and anterior communicating arteries are within normal limits. Proximal A2 branches are within normal limits but there is severe left ACA A2 segment stenosis near the callosomarginal artery, and moderate to severe bilateral distal ACA/pericallosal artery irregularity and stenosis (series 406, image 25). Venous sinuses: Patent. Anatomic variants: Dominant left vertebral artery. Fetal type right PCA origin. Review of the MIP images confirms the above findings  IMPRESSION: 1. No atherosclerosis or stenosis in the neck, ICA siphons, or basilar artery. 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 Still, there is no emergent large vessel occlusion or target for endovascular intervention identified. 2. Moderate or severe stenoses of the: - Left PCA P1 and P2, - Right MCA origin and M1, - Left ACA A2 and bilateral distal pericallosal arteries, - Left MCA M2 and M3. 3. Left thyroid 18 mm nodule meets consensus criteria for nonemergent follow-up Thyroid Ultrasound characterization. Electronically Signed: By: Odessa Fleming M.D. On: 02/25/2017 13:57   Mr Maxine Glenn Head Wo Contrast  Result Date: 02/25/2017 CLINICAL  DATA:  Sudden onset of right upper and lower extremity weakness and facial droop. Hypertensive crisis. Sickle cell disease. EXAM: MRI HEAD WITHOUT CONTRAST MRA HEAD WITHOUT CONTRAST TECHNIQUE: Multiplanar, multiecho pulse sequences of the brain and surrounding structures were obtained without intravenous contrast. Angiographic images of the head were obtained using MRA technique without contrast. COMPARISON:  CT head without contrast and CTA head and neck from the same day. FINDINGS: MRI HEAD FINDINGS Brain: Diffusion-weighted images demonstrate restricted diffusion within the 80 left lack infarct noted on CT. Areas of T2 shine through or noted in the corona radiata bilaterally. No additional acute infarcts are present. There are remote infarcts involving the right basal ganglia. Scattered subcortical T2 hyperintensities are highly advanced for age. Bilateral T2 hyperintensities are present within the corona radiata. A remote infarct is present in the genu of the left internal capsule. A remote infarct is noted along the left side of the body of the corpus callosum. Vascular: Flow is present in the major intracranial arteries. Skull and upper cervical spine: The skullbase is within normal limits. The craniocervical junction is unremarkable. Midline sagittal structures are within normal limits. The upper cervical spine is normal. Sinuses/Orbits: The paranasal sinuses and mastoid air cells are clear. The globes and orbits are within normal limits MRA HEAD FINDINGS The internal carotid arteries are within normal limits the high cervical segments through the ICA termini bilaterally. Mild narrowing is again seen at the origins of the right A1 and M1 segments. There is moderate narrowing of the distal right M1 segment. Segmental narrowing present throughout the MCA branch vessels bilaterally. The left vertebral artery is the dominant vessel. PICA origins are visualized and normal bilaterally. The basilar artery is normal.  A moderate stenosis present proximal left posterior cerebral artery. The right posterior cerebral artery is of fetal type. There is cecum at attenuation distal PCA branch vessels bilaterally. IMPRESSION: 1. 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 2. Remote infarcts involving the basal ganglia bilaterally. 3. Scattered white matter changes bilaterally are mildly advanced for age. This likely also reflects the sequela of microvascular ischemia or vasculitis. 4. Remote infarcts involving the body of the corpus callosum. 5. Extensive medium and small vessel disease evident on the MRA as described. Electronically Signed   By: Marin Roberts M.D.   On: 02/25/2017 15:07   Mr Brain Wo Contrast  Result Date: 02/25/2017 CLINICAL DATA:  Sudden onset of right upper and lower extremity weakness and facial droop. Hypertensive crisis. Sickle cell disease. EXAM: MRI HEAD WITHOUT CONTRAST MRA HEAD WITHOUT CONTRAST TECHNIQUE: Multiplanar, multiecho pulse sequences of the brain and surrounding structures were obtained without intravenous contrast. Angiographic images of the head were obtained using MRA technique without contrast. COMPARISON:  CT head without contrast and CTA head and neck from the same day.  FINDINGS: MRI HEAD FINDINGS Brain: Diffusion-weighted images demonstrate restricted diffusion within the 80 left lack infarct noted on CT. Areas of T2 shine through or noted in the corona radiata bilaterally. No additional acute infarcts are present. There are remote infarcts involving the right basal ganglia. Scattered subcortical T2 hyperintensities are highly advanced for age. Bilateral T2 hyperintensities are present within the corona radiata. A remote infarct is present in the genu of the left internal capsule. A remote infarct is noted along the left side of the body of the corpus callosum. Vascular: Flow is present in the major intracranial arteries. Skull and upper  cervical spine: The skullbase is within normal limits. The craniocervical junction is unremarkable. Midline sagittal structures are within normal limits. The upper cervical spine is normal. Sinuses/Orbits: The paranasal sinuses and mastoid air cells are clear. The globes and orbits are within normal limits MRA HEAD FINDINGS The internal carotid arteries are within normal limits the high cervical segments through the ICA termini bilaterally. Mild narrowing is again seen at the origins of the right A1 and M1 segments. There is moderate narrowing of the distal right M1 segment. Segmental narrowing present throughout the MCA branch vessels bilaterally. The left vertebral artery is the dominant vessel. PICA origins are visualized and normal bilaterally. The basilar artery is normal. A moderate stenosis present proximal left posterior cerebral artery. The right posterior cerebral artery is of fetal type. There is cecum at attenuation distal PCA branch vessels bilaterally. IMPRESSION: 1. 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 2. Remote infarcts involving the basal ganglia bilaterally. 3. Scattered white matter changes bilaterally are mildly advanced for age. This likely also reflects the sequela of microvascular ischemia or vasculitis. 4. Remote infarcts involving the body of the corpus callosum. 5. Extensive medium and small vessel disease evident on the MRA as described. Electronically Signed   By: Marin Roberts M.D.   On: 02/25/2017 15:07   Ct Head Code Stroke W/o Cm  Result Date: 02/25/2017 CLINICAL DATA:  Code stroke. 41 year old female last seen normal this morning. Left facial droop and left side numbness. Initial encounter. Sickle cell disease EXAM: CT HEAD WITHOUT CONTRAST TECHNIQUE: Contiguous axial images were obtained from the base of the skull through the vertex without intravenous contrast. COMPARISON:  Report of brain MRI 07/11/1999 (no images  available). FINDINGS: Brain: Overall normal cerebral volume. Chronic appearing lacunar infarct right basal ganglia. Small age indeterminate hypodensity left thalamus (series 21, image 14). Focal chronic appearing lacunar infarct in the body of the corpus callosum (coronal image 34). Other small areas of cerebral white matter hypodensity. No midline shift, ventriculomegaly, mass effect, evidence of mass lesion, intracranial hemorrhage or evidence of cortically based acute infarction. Vascular: No suspicious intracranial vascular hyperdensity. Skull: Negative. Sinuses/Orbits: Clear. Other: Negative orbit and scalp soft tissues. ASPECTS [facility] Stroke Program Early CT Score) - Ganglionic level infarction (caudate, lentiform nuclei, internal capsule, insula, M1-M3 cortex): 7 - Supraganglionic infarction (M4-M6 cortex): 3 Total score (0-10 with 10 being normal): 10 IMPRESSION: 1. No acute cortically based infarct or acute intracranial hemorrhage. ASPECTS is 10. 2. Evidence of scattered small vessel ischemia, with chronic appearing involvement of the corpus callosum and right basal ganglia. Small age indeterminate area in the left thalamus. 3. The above was relayed via text pager to Dr. Johnn Hai on 02/25/2017 at 13:09 . Electronically Signed   By: Odessa Fleming M.D.   On: 02/25/2017 13:09    Scheduled Meds: .  stroke: mapping our  early stages of recovery book   Does not apply Once  . aspirin  300 mg Rectal Daily   Or  . aspirin  325 mg Oral Daily  . aspirin  325 mg Oral Once  . atorvastatin  40 mg Oral q1800  . heparin  5,000 Units Subcutaneous Q8H  . ketorolac  15 mg Intravenous Q6H  . vitamin B-12  100 mcg Oral Daily   Continuous Infusions:  Principal Problem:   Stroke (cerebrum) (HCC) Active Problems:   Sickle cell disease (HCC)   Stroke (HCC)   Acute ischemic stroke (HCC)   Dysphagia, post-stroke   Benign essential HTN   Polycystic kidney disease   Prediabetes   Nonintractable headache    Leukocytosis     In excess of 35 minutes spent during this visit. Greater than 50% involved face to face contact with the patient for assessment, counseling and coordination of care.

## 2017-02-28 NOTE — [place] FL2 (Signed)
[place] MEDICAID FL2 LEVEL OF CARE SCREENING TOOL     IDENTIFICATION  Patient Name: Sharon Chan Birthdate: Feb 24, 1976 Sex: female Admission Date (Current Location): 02/25/2017  [facility] and [place] Number:  Producer, television/film/video and Address:  [facility], [address], [place], [place] [address]      Provider Number: [id number]  Attending Physician Name and Address:  Altha Harm, MD  Relative Name and Phone Number:       Current Level of Care: Hospital Recommended Level of Care: Skilled Nursing Facility Prior Approval Number:    Date Approved/Denied:   PASRR Number: [id number]  Discharge Plan: SNF    Current Diagnoses: Patient Active Problem List   Diagnosis Date Noted  . Sickle cell crisis (HCC)   . Changes in vision   . Acute ischemic stroke (HCC)   . Dysphagia, post-stroke   . Benign essential HTN   . Polycystic kidney disease   . Prediabetes   . Nonintractable headache   . Leukocytosis   . Stroke (cerebrum) (HCC) 02/25/2017  . Sickle cell disease (HCC) 02/25/2017  . Stroke (HCC) 02/25/2017    Orientation RESPIRATION BLADDER Height & Weight     Self, Time, Situation, Place  Normal Continent Weight: 226 lb 9.6 oz (102.8 kg) Height:   (160 cm)  BEHAVIORAL SYMPTOMS/MOOD NEUROLOGICAL BOWEL NUTRITION STATUS      Continent Diet (Regular diet; thin fluids)  AMBULATORY STATUS COMMUNICATION OF NEEDS Skin   Limited Assist Verbally Normal                       Personal Care Assistance Level of Assistance  Bathing, Feeding, Dressing Bathing Assistance: Maximum assistance Feeding assistance: Limited assistance Dressing Assistance: Maximum assistance     Functional Limitations Info  Sight, Hearing, Speech Sight Info: Adequate Hearing Info: Adequate Speech Info: Adequate    SPECIAL CARE FACTORS FREQUENCY  PT (By licensed PT), OT (By licensed OT)     PT Frequency: 4x OT Frequency: 2x             Contractures Contractures Info: Not present    Additional Factors Info  Code Status, Allergies Code Status Info: Full Allergies Info: No known allergies           Current Medications (02/28/2017):  This is the current hospital active medication list Current Facility-Administered Medications  Medication Dose Route Frequency Provider Last Rate Last Dose  .  stroke: mapping our early stages of recovery book   Does not apply Once Haydee Salter, MD      . acetaminophen (TYLENOL) tablet 1,000 mg  1,000 mg Oral Q6H PRN Layne Benton, NP       Or  . acetaminophen (TYLENOL) solution 650 mg  650 mg Per Tube Q4H PRN Layne Benton, NP       Or  . acetaminophen (TYLENOL) suppository 650 mg  650 mg Rectal Q4H PRN Layne Benton, NP      . aspirin suppository 300 mg  300 mg Rectal Daily Haydee Salter, MD       Or  . aspirin tablet 325 mg  325 mg Oral Daily Haydee Salter, MD   325 mg at 02/28/17 1000  . aspirin tablet 325 mg  325 mg Oral Once Haydee Salter, MD      . atorvastatin (LIPITOR) tablet 40 mg  40 mg Oral q1800 David L Rinehuls, PA-C   40 mg  at 02/28/17 1710  . heparin injection 5,000 Units  5,000 Units Subcutaneous Q8H Haydee Salter, MD   5,000 Units at 02/28/17 1356  . hydrALAZINE (APRESOLINE) injection 10 mg  10 mg Intravenous Q8H PRN Haydee Salter, MD      . HYDROmorphone (DILAUDID) injection 2 mg  2 mg Intravenous Q4H PRN Rometta Emery, MD   2 mg at 02/28/17 1608  . ketorolac (TORADOL) 15 MG/ML injection 15 mg  15 mg Intravenous Q6H Altha Harm, MD   15 mg at 02/28/17 1710  . ondansetron (ZOFRAN) injection 4 mg  4 mg Intravenous Q6H PRN Weston Settle, MD   4 mg at 02/27/17 1557  . senna-docusate (Senokot-S) tablet 1 tablet  1 tablet Oral QHS PRN Haydee Salter, MD      . vitamin B-12 (CYANOCOBALAMIN) tablet 100 mcg  100 mcg Oral Daily Haydee Salter, MD   100 mcg at 02/28/17 1000     Discharge Medications: Please see discharge summary for a list of  discharge medications.  Relevant Imaging Results:  Relevant Lab Results:   Additional Information SSN: [id number]  Dominic Pea, LCSW

## 2017-02-28 NOTE — Progress Notes (Signed)
Pt ambulated to bathroom with standby assist with rolling walker. Gait steady, pt still complaints of weakness to right side. Administered pain medication, repositioned for comfort. Call bell within reach at this time she is in bed. Mother at bedside. No noted distress. Will continue to monitor.

## 2017-03-01 ENCOUNTER — Encounter (HOSPITAL_COMMUNITY): Payer: Self-pay | Admitting: Physical Medicine and Rehabilitation

## 2017-03-01 DIAGNOSIS — E785 Hyperlipidemia, unspecified: Secondary | ICD-10-CM

## 2017-03-01 DIAGNOSIS — E041 Nontoxic single thyroid nodule: Secondary | ICD-10-CM

## 2017-03-01 LAB — B. BURGDORFI ANTIBODIES

## 2017-03-01 LAB — IRON AND TIBC
IRON: 59 ug/dL (ref 28–170)
SATURATION RATIOS: 20 % (ref 10.4–31.8)
TIBC: 291 ug/dL (ref 250–450)
UIBC: 232 ug/dL

## 2017-03-01 LAB — FERRITIN: Ferritin: 47 ng/mL (ref 11–307)

## 2017-03-01 LAB — CBC WITH DIFFERENTIAL/PLATELET
BASOS ABS: 0 10*3/uL (ref 0.0–0.1)
BASOS PCT: 0 %
EOS PCT: 4 %
Eosinophils Absolute: 0.4 10*3/uL (ref 0.0–0.7)
HCT: 35.7 % — ABNORMAL LOW (ref 36.0–46.0)
Hemoglobin: 11.8 g/dL — ABNORMAL LOW (ref 12.0–15.0)
Lymphocytes Relative: 45 %
Lymphs Abs: 3.9 10*3/uL (ref 0.7–4.0)
MCH: 25 pg — ABNORMAL LOW (ref 26.0–34.0)
MCHC: 33.1 g/dL (ref 30.0–36.0)
MCV: 75.6 fL — ABNORMAL LOW (ref 78.0–100.0)
MONO ABS: 0.6 10*3/uL (ref 0.1–1.0)
MONOS PCT: 7 %
Neutro Abs: 3.8 10*3/uL (ref 1.7–7.7)
Neutrophils Relative %: 44 %
PLATELETS: 285 10*3/uL (ref 150–400)
RBC: 4.72 MIL/uL (ref 3.87–5.11)
RDW: 15.4 % (ref 11.5–15.5)
WBC: 8.7 10*3/uL (ref 4.0–10.5)

## 2017-03-01 LAB — HEMOGLOBINOPATHY EVALUATION
HGB C: 0 %
HGB S QUANTITAION: 34.9 % — AB
HGB VARIANT: 0 %
Hgb A2 Quant: 3.4 % — ABNORMAL HIGH (ref 1.8–3.2)
Hgb A: 61.7 % — ABNORMAL LOW (ref 96.4–98.8)
Hgb F Quant: 0 % (ref 0.0–2.0)

## 2017-03-01 LAB — RETICULOCYTES
RBC.: 4.72 MIL/uL (ref 3.87–5.11)
RETIC COUNT ABSOLUTE: 51.9 10*3/uL (ref 19.0–186.0)
Retic Ct Pct: 1.1 % (ref 0.4–3.1)

## 2017-03-01 LAB — RPR: RPR: NONREACTIVE

## 2017-03-01 LAB — ANA W/REFLEX IF POSITIVE: ANA: NEGATIVE

## 2017-03-01 LAB — HIV ANTIBODY (ROUTINE TESTING W REFLEX): HIV SCREEN 4TH GENERATION: NONREACTIVE

## 2017-03-01 LAB — ANGIOTENSIN CONVERTING ENZYME: ANGIOTENSIN-CONVERTING ENZYME: 34 U/L (ref 14–82)

## 2017-03-01 MED ORDER — OXYCODONE HCL 5 MG PO TABS
5.0000 mg | ORAL_TABLET | ORAL | Status: DC | PRN
  Administered 2017-03-01 – 2017-03-02 (×2): 5 mg via ORAL
  Filled 2017-03-01 (×2): qty 1

## 2017-03-01 MED ORDER — TROPICAMIDE 1 % OP SOLN
1.0000 [drp] | Freq: Once | OPHTHALMIC | Status: DC
  Filled 2017-03-01: qty 2

## 2017-03-01 MED ORDER — IBUPROFEN 200 MG PO TABS
600.0000 mg | ORAL_TABLET | Freq: Four times a day (QID) | ORAL | Status: DC | PRN
  Filled 2017-03-01: qty 3

## 2017-03-01 MED ORDER — PHENYLEPHRINE HCL 2.5 % OP SOLN
1.0000 [drp] | Freq: Once | OPHTHALMIC | Status: DC
  Filled 2017-03-01: qty 2

## 2017-03-01 NOTE — Progress Notes (Signed)
I contacted Dr. Ashley Royalty that consult with opthalmology had not yet been complete. She states to hold d/c for today. Pt and RN made aware. I will follow up tomorrow. [phone]

## 2017-03-01 NOTE — Consult Note (Signed)
Reason for consult:  HPI: Sharon Chan is an 41 y.o. female with HTN and Sickle cell admitted for a CVA who we are asked to evaluate further for complaints of decreased vision OU.   At times the patient is confused when providing the history:   The patient reports since suffering the CVA ~5 days ago she's had decreased vision in each eye.  She describes the right eye vision as dark, with significant floaters.  She describes the vision of the left eye as fluctuating between clear and transient blurriness.   She denies diplopia.  She denies eye pain.      Past Medical History:  Diagnosis Date  . Polycystic disease, ovaries   . Sickle cell disease (HCC)    Past Surgical History:  Procedure Laterality Date  . LAPAROSCOPIC CHOLECYSTECTOMY     Family History  Problem Relation Age of Onset  . Heart attack Mother   . Post-traumatic stress disorder Father     committed suicide  . Diabetes Father   . Hypertension Sister    Current Facility-Administered Medications  Medication Dose Route Frequency Provider Last Rate Last Dose  .  stroke: mapping our early stages of recovery book   Does not apply Once Haydee Salter, MD      . acetaminophen (TYLENOL) tablet 1,000 mg  1,000 mg Oral Q6H PRN Layne Benton, NP       Or  . acetaminophen (TYLENOL) solution 650 mg  650 mg Per Tube Q4H PRN Layne Benton, NP       Or  . acetaminophen (TYLENOL) suppository 650 mg  650 mg Rectal Q4H PRN Layne Benton, NP      . aspirin suppository 300 mg  300 mg Rectal Daily Haydee Salter, MD       Or  . aspirin tablet 325 mg  325 mg Oral Daily Haydee Salter, MD   325 mg at 03/01/17 0933  . aspirin tablet 325 mg  325 mg Oral Once Haydee Salter, MD      . atorvastatin (LIPITOR) tablet 40 mg  40 mg Oral q1800 David L Rinehuls, PA-C   40 mg at 03/01/17 1714  . heparin injection 5,000 Units  5,000 Units Subcutaneous Q8H Haydee Salter, MD   5,000 Units at 03/01/17 1526  . hydrALAZINE (APRESOLINE)  injection 10 mg  10 mg Intravenous Q8H PRN Haydee Salter, MD      . ibuprofen (ADVIL,MOTRIN) tablet 600 mg  600 mg Oral Q6H PRN Altha Harm, MD      . ondansetron [facility]) injection 4 mg  4 mg Intravenous Q6H PRN Weston Settle, MD   4 mg at 02/27/17 1557  . oxyCODONE (Oxy IR/ROXICODONE) immediate release tablet 5 mg  5 mg Oral Q4H PRN Altha Harm, MD      . senna-docusate (Senokot-S) tablet 1 tablet  1 tablet Oral QHS PRN Haydee Salter, MD      . vitamin B-12 (CYANOCOBALAMIN) tablet 100 mcg  100 mcg Oral Daily Haydee Salter, MD   100 mcg at 03/01/17 0932   No Known Allergies Social History   Social History  . Marital status: Married    Spouse name: N/A  . Number of children: N/A  . Years of education: N/A   Occupational History  . Not on file.   Social History Main Topics  . Smoking status: Never Smoker  . Smokeless tobacco: Never Used  . Alcohol use No  .  Drug use: Unknown  . Sexual activity: Not on file   Other Topics Concern  . Not on file   Social History Narrative  . No narrative on file    Review of systems: ROS  Physical Exam:  Blood pressure (!) 159/93, pulse 84, temperature 98.3 F (36.8 C), temperature source Oral, resp. rate 18, height  (1.6 m), weight 102.8 kg (226 lb 9.6 oz), last menstrual period 01/31/2017, SpO2 99 %.   [facility] [place] :  OD ?20/800 (some confusion of letters on 20/800 line) OS  20/50 -  Pupils:   OD round, reactive to light, no APD            OS round, reactive to light, no APD  IOP (T pen)  OD  13 OS  13  CVF: OD pt reports diffuse depression in all feilds   OS full to HM  Motility:  OD full ductions  OS full ductions  Balance/alignment:  Ortho by Phoebe Perch   Magnified bedside lighted examination:                                 OD                                       External/adnexa: Normal                                      Lids/lashes:        Normal                                      Conjunctiva         White, quiet        Cornea:              Clear                  AC:                     Deep, quiet                                Iris:                     Normal        Lens:                  Clear                                       OS                                       External/adnexa: Normal                                      Lids/lashes:  Normal                                      Conjunctiva        White, quiet        Cornea:              Clear                  AC:                     Deep, quiet                                Iris:                     Normal        Lens:                  Clear       Dilated fundus exam: (Neo 2.5; Myd 1%)      OD Vitreous            Clear, quiet                                Optic Disc:       Normal, perfused                      Macula:             Flat                                            Vessels:           Normal caliber,distribution         Periphery:         Flat, attached                                      OS Vitreous            Clear, quiet                                Optic Disc:       Normal, perfused                      Macula:             Flat                                            Vessels:           Normal caliber,distribution         Periphery:         Flat, attached        Labs/studies: Results for orders placed or performed during the hospital encounter of 02/25/17 (from the past 48  hour(s))  TSH     Status: None   Collection Time: 02/28/17  5:53 AM  Result Value Ref Range   TSH 2.264 0.350 - 4.500 uIU/mL    Comment: Performed by a 3rd Generation assay with a functional sensitivity of <=0.01 uIU/mL.  ANA w/Reflex if Positive     Status: None   Collection Time: 02/28/17  2:24 PM  Result Value Ref Range   Anit Nuclear Antibody(ANA) Negative Negative    Comment: (NOTE) Performed At: [facility] [address] [place], [place] [id number] Mila Homer MD WU:[id number]    Angiotensin converting enzyme     Status: None   Collection Time: 02/28/17  2:24 PM  Result Value Ref Range   Angiotensin-Converting Enzyme 34 14 - 82 U/L    Comment: (NOTE) Performed At: [facility] [address] [place], [place] [id number] Mila Homer MD VH:[id number]   Sedimentation rate     Status: Abnormal   Collection Time: 02/28/17  2:24 PM  Result Value Ref Range   Sed Rate 37 (H) 0 - 22 mm/hr  B. burgdorfi antibodies     Status: None   Collection Time: 02/28/17  2:24 PM  Result Value Ref Range   B burgdorferi Ab IgG+IgM <0.91 0.00 - 0.90 ISR    Comment: (NOTE)                                Negative         <0.91                                Equivocal  0.91 - 1.09                                Positive         >1.09 Performed At: [facility] [address] [place], [place] [id number] Mila Homer MD [id number]   RPR     Status: None   Collection Time: 02/28/17  2:24 PM  Result Value Ref Range   RPR Ser Ql Non Reactive Non Reactive    Comment: (NOTE) Performed At: [facility] [address] [place], [place] [id number] Mila Homer MD VF:[id number]   HIV antibody     Status: None   Collection Time: 02/28/17  2:24 PM  Result Value Ref Range   HIV Screen 4th Generation wRfx Non Reactive Non Reactive    Comment: (NOTE) Performed At: [facility] [address] [place], [place] [id number] Mila Homer MD SW:[id number]   CBC with Differential/Platelet     Status: Abnormal   Collection Time: 03/01/17  5:12 AM  Result Value Ref Range   WBC 8.7 4.0 - 10.5 K/uL   RBC 4.72 3.87 - 5.11 MIL/uL   Hemoglobin 11.8 (L) 12.0 - 15.0 g/dL   HCT 22.0 (L) 25.4 - 27.0 %   MCV 75.6 (L) 78.0 - 100.0 fL   MCH 25.0 (L) 26.0 - 34.0 pg   MCHC 33.1 30.0 - 36.0 g/dL   RDW 62.3 76.2 - 83.1 %   Platelets 285 150 - 400 K/uL   Neutrophils Relative % 44 %   Neutro Abs 3.8 1.7 - 7.7 K/uL   Lymphocytes Relative 45 %   Lymphs Abs  3.9 0.7 - 4.0 K/uL  Monocytes Relative 7 %   Monocytes Absolute 0.6 0.1 - 1.0 K/uL   Eosinophils Relative 4 %   Eosinophils Absolute 0.4 0.0 - 0.7 K/uL   Basophils Relative 0 %   Basophils Absolute 0.0 0.0 - 0.1 K/uL  Reticulocytes     Status: None   Collection Time: 03/01/17  5:12 AM  Result Value Ref Range   Retic Ct Pct 1.1 0.4 - 3.1 %   RBC. 4.72 3.87 - 5.11 MIL/uL   Retic Count, Manual 51.9 19.0 - 186.0 K/uL   No results found.                           Assessment and Plan: Sharon Chan is an 41 y.o. female with HTN and Sickle cell admitted for a CVA who we are asked to evaluate further for complaints of decreased vision OU with:  -- No acute findings on bedside eye examination.    Recommend:   -- Out patient follow up in clinic for further evaluation and testing including but not limited to Humphrey visual field.        All of the above information was relayed to the patient  Follow up contact information was provided.  All questions were answered.   Antony Contras 03/01/2017, 8:45 PM  [facility] Ophthalmology [phone]

## 2017-03-01 NOTE — Evaluation (Signed)
Physical Therapy Evaluation [name] MRN: [id number] DOB: Jan 01, 1976 Today's Date: 03/01/2017   History of Present Illness  Pt is a 41 y.o. female who presented to the ED with R LE weakness, R UE weakness and numbness/tingling, and R facial numbness. 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 MRI revealed acute infarct in L thalamus. Pt with PMH significant for polycystic disease (ovaries), and sickle cell disease.  Clinical Impression  Pt progressing towards goals and remains extremely motivated to participate. Pt demonstrating improved grip with RUE with use of grip assist on RW. Min A to min guard A for mobility this session, and mod A for bed mobility. Continue to recommend CIR as appropriate d/c recommendation as pt remains extremely motivated to regain independence. Will continue to follow to maximize functional mobility independence.     Follow Up Recommendations CIR    Equipment Recommendations  None recommended by PT    Recommendations for Other Services Rehab consult     Precautions / Restrictions Precautions Precautions: Fall Restrictions Weight Bearing Restrictions: No      Mobility  Bed Mobility Overal bed mobility: Needs Assistance Bed Mobility: Sit to Supine       Sit to supine: Mod assist   General bed mobility comments: Pt required assist for RLE lift into bed and RUE management during bed mobility. Educated pt on hooking LLE under RLE to assist with elevation.   Transfers Overall transfer level: Needs assistance Equipment used: Rolling walker (2 wheeled) (grip assist on RUE on RW ) Transfers: Sit to/from [employer] Sit to Stand: Min assist Stand pivot transfers: Min assist       General transfer comment: Verbal cues for hand placement. Assiste required for RUE placement on RW. Verbal cues for placing RLE in same position as LLE to encourage weightshift and muscle  activation in RLE during transfer. Min A for lift assist and steadying during transfer.   Ambulation/Gait Ambulation/Gait assistance: Min guard;Min assist;+2 physical assistance Ambulation Distance (Feet): 100 Feet Assistive device: Rolling walker (2 wheeled) (Grip assist on RW for RUE. ) Gait Pattern/deviations: Decreased stride length;Decreased dorsiflexion - right;Decreased weight shift to right;Trunk flexed;Step-to pattern Gait velocity: decreased Gait velocity interpretation: Below normal speed for age/gender General Gait Details: Use of RW and grip assist for RUE on RW. Pt able to maintain RUE placement on RW with grip assist and strap. Pt required standing rest break ~50' secondary to muscle fatigue. Verbal cues for upright posture and appropriate proximity to RW during gait.   Stairs            Wheelchair Mobility    Modified Rankin (Stroke Patients Only) Modified Rankin (Stroke Patients Only) Pre-Morbid Rankin Score: No symptoms Modified Rankin: Moderately severe disability     Balance Overall balance assessment: Needs assistance Sitting-balance support: No upper extremity supported;Feet supported Sitting balance-Leahy Scale: Fair     Standing balance support: Bilateral upper extremity supported Standing balance-Leahy Scale: Poor Standing balance comment: Reliant on RW for stability                              Pertinent Vitals/Pain Pain Assessment: Faces Faces Pain Scale: No hurt    Home Living     Available Help at Discharge:  (Mom)                  Prior Function  Hand Dominance        Extremity/Trunk Assessment                Communication      Cognition Arousal/Alertness: Awake/alert Behavior During Therapy: WFL for tasks assessed/performed Overall Cognitive Status: Impaired/Different from baseline Area of Impairment: Problem solving;Memory                     Memory: Decreased  short-term memory       Problem Solving: Slow processing;Difficulty sequencing        General Comments General comments (skin integrity, edema, etc.): Pt continues to be motivated to participate in therapy session. Education about therapy at [facility] this session.     Exercises     Assessment/Plan    PT Assessment    PT Problem List         PT Treatment Interventions      PT Goals (Current goals can be found in the Care Plan section)  Acute Rehab PT Goals Patient Stated Goal: to get back to normal PT Goal Formulation: With patient Time For Goal Achievement: 03/12/17 Potential to Achieve Goals: Good    Frequency Min 4X/week   Barriers to discharge        Co-evaluation               End of Session Equipment Utilized During Treatment: Gait belt Activity Tolerance: Patient tolerated treatment well Patient left: in bed;with call bell/phone within reach;with chair alarm set Nurse Communication: Mobility status PT Visit Diagnosis: Hemiplegia and hemiparesis;Muscle weakness (generalized) (M62.81);Unsteadiness on feet (R26.81) Hemiplegia - Right/Left: Right Hemiplegia - dominant/non-dominant: Non-dominant Hemiplegia - caused by: Cerebral infarction Pain - Right/Left: Right Pain - part of body: Hip    Time: 1310-1335 PT Time Calculation (min) (ACUTE ONLY): 25 min   Charges:     PT Treatments $Gait Training: 23-37 mins   PT G Codes:        Margot Chimes, PT, DPT  Acute Rehabilitation Services  Pager: [phone]   Melvyn Novas 03/01/2017, 2:47 PM

## 2017-03-01 NOTE — Care Management Note (Signed)
Case Management Note  Patient Details  Name: Sharon Chan MRN: [id number] Date of Birth: 06/17/1976  Subjective/Objective:                    Action/Plan: Plan is for patient to d/c to CIR once medically ready. No further needs per CM.   Expected Discharge Date:                  Expected Discharge Plan:  IP Rehab Facility  In-House Referral:     Discharge planning Services     Post Acute Care Choice:    Choice offered to:     DME Arranged:    DME Agency:     HH Arranged:    HH Agency:     Status of Service:  Completed, signed off  If discussed at [employer] of Stay Meetings, dates discussed:    Additional Comments:  Kermit Balo, RN 03/01/2017, 12:14 PM

## 2017-03-01 NOTE — PMR Pre-admission (Signed)
PMR Admission Coordinator Pre-Admission Assessment  Patient: Sharon Chan is an 41 y.o., female MRN: [id number] DOB: 1976/02/04 Height:  (160 cm) Weight: 102.8 kg (226 lb 9.6 oz)              Insurance Information HMO:     PPO:      PCP:      IPA:      80/20: yes     OTHER: no HMO PRIMARY: medicare a and b      Policy#: [id number]      Subscriber: pt Benefits:  Phone #: passport one online     Name: 03/01/17 Eff. Date: 01/12/2002     Deduct: $1340      Out of Pocket Max: none      Life Max: none CIR: 100%      SNF: 20 full days Outpatient: 80%     Co-Pay: 20% Home Health: 100%      Co-Pay: none DME: 80%     Co-Pay: 20% Providers: pt choice  SECONDARY: Medicaid [place] Access      Policy#: [id number]     Subscriber: pt  Medicaid Application Date:       Case Manager:  Disability Application Date:       Case Worker:   Emergency Contact Information Contact Information    Name Relation Home Work Mobile   Sharon Chan Mother [phone]     Sharon Chan,Sharon Chan Other [phone]    Sharon Chan [phone]       Current Medical History  Patient Admitting Diagnosis: acute nonhemorrhagic infarct of left thalamus  History of Present Illness:  Sharon Myles Richardsonis a 41 y.o. left handed female with questionable history of S, PCOS who was admitted on 02/25/17 with RLE weakness and numbness. Blood pressures elevated requiring IV labetalol. 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  CTA head/neck done revealing widespread moderate severe stenosis and 18 mm left thyroid nodule--non-emergent follow up recommended.  HIV and RPR negative. 2 D echo with EF 60-65% with no wall abnormality.  Dr. 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 She was treated for SS crises and no indication for transfusion.  She has reported decrease in vision with waxing and waning of vision therefore opthalmology consulted for input.  Dr. Pearlean Brownie feels that stroke due small vessel disease v/so vasculopathy and patient on ASA for secondary stroke prevention.   Total: 8 NIHSS  Past Medical History  Past Medical History:  Diagnosis Date  . Polycystic disease, ovaries   . Sickle cell trait (HCC)     Family History  family history includes Diabetes in her father; Heart attack in her mother; Hypertension in her sister; Post-traumatic stress disorder in her father.  Prior Rehab/Hospitalizations:  Has the patient had major surgery during 100 days prior to admission? No  Current Medications   Current Facility-Administered Medications:  .   stroke: mapping our early stages of recovery book, , Does not apply, Once, Haydee Salter, MD .  acetaminophen (TYLENOL) tablet 1,000 mg, 1,000 mg, Oral, Q6H PRN **OR** acetaminophen (TYLENOL) solution 650 mg, 650 mg, Per Tube, Q4H PRN **OR** acetaminophen (TYLENOL) suppository 650 mg, 650 mg, Rectal, Q4H PRN, Layne Benton, NP .  aspirin suppository 300 mg, 300 mg, Rectal, Daily **OR** aspirin tablet 325  mg, 325 mg, Oral, Daily, Haydee Salter, MD, 325 mg at 03/01/17 0933 .  aspirin tablet 325 mg, 325 mg, Oral, Once, Haydee Salter, MD .  atorvastatin (LIPITOR) tablet 40 mg, 40 mg, Oral, q1800, David L Rinehuls, PA-C, 40 mg at 03/01/17 1714 .  heparin injection 5,000 Units, 5,000 Units, Subcutaneous, Q8H, Haydee Salter, MD, 5,000 Units at 03/02/17 0559 .  hydrALAZINE (APRESOLINE) injection 10 mg, 10 mg, Intravenous, Q8H PRN, Haydee Salter, MD .  ibuprofen (ADVIL,MOTRIN) tablet 600 mg, 600 mg, Oral, Q6H PRN, Altha Harm, MD .  ondansetron  [facility]) injection 4 mg, 4 mg, Intravenous, Q6H PRN, Weston Settle, MD, 4 mg at 02/27/17 1557 .  oxyCODONE (Oxy  IR/ROXICODONE) immediate release tablet 5 mg, 5 mg, Oral, Q4H PRN, Altha Harm, MD, 5 mg at 03/02/17 0559 .  senna-docusate (Senokot-S) tablet 1 tablet, 1 tablet, Oral, QHS PRN, Haydee Salter, MD .  vitamin B-12 (CYANOCOBALAMIN) tablet 100 mcg, 100 mcg, Oral, Daily, Haydee Salter, MD, 100 mcg at 03/01/17 0932  Patients Current Diet: Diet regular Room service appropriate? Yes; Fluid consistency: Thin Diet - low sodium heart healthy  Precautions / Restrictions Precautions Precautions: Fall Restrictions Weight Bearing Restrictions: No Other Position/Activity Restrictions: R-sided weakness   Has the patient had 2 or more falls or a fall with injury in the past year?No  Prior Activity Level Limited Community (1-2x/wk): independent  Journalist, newspaper / Equipment Home Assistive Devices/Equipment: None Home Equipment: Walker - 2 wheels  Prior Device Use: Indicate devices/aids used by the patient prior to current illness, exacerbation or injury? None of the above  Prior Functional Level Prior Function Level of Independence: Independent Comments: Drives  Self Care: Did the patient need help bathing, dressing, using the toilet or eating?  Independent  Indoor Mobility: Did the patient need assistance with walking from room to room (with or without device)? Independent  Stairs: Did the patient need assistance with internal or external stairs (with or without device)? Independent  Functional Cognition: Did the patient need help planning regular tasks such as shopping or remembering to take medications? Independent  Current Functional Level Cognition  Overall Cognitive Status: Impaired/Different from baseline Orientation Level: Oriented X4 General Comments: Completed MOCA with pt. Pt scored 22/30 (with 26/30 representing normal score). Greatest difficulty with visuospatial and executive functioning as well as delayed recall. SLight difficulty with serial 7's and pt  reports that "numbers are harder to figrure out."    Extremity Assessment (includes Sensation/Coordination)  Upper Extremity Assessment: RUE deficits/detail RUE Deficits / Details: Numbness/tingling and decreased light touch throughout. Pt with 3/5 strength in R shoulder flexion, 3-/5 R elbow flexion/extension, and 3+/5 grasp strength. Dysmetria with finger to nose and unable to isolate R digits. RUE Sensation: decreased light touch, decreased proprioception RUE Coordination: decreased fine motor  Lower Extremity Assessment: RLE deficits/detail RLE Deficits / Details: 3/5 strength for hip flexion, knee flexion and extension.  2/5 strengh dorsiflexion/plantarflexion.  Decreased light touch and coordination with heel to shin slides. RLE Sensation: decreased light touch, decreased proprioception RLE Coordination: decreased gross motor    ADLs  Overall ADL's : Needs assistance/impaired Eating/Feeding: Minimal assistance, Sitting Grooming: Sitting, Moderate assistance, Oral care, Wash/dry face Upper Body Bathing: Moderate assistance, Sitting Lower Body Bathing: Sit to/from stand, Maximal assistance Upper Body Dressing : Moderate assistance, Sitting Lower Body Dressing: Maximal assistance, Sit to/from stand Toilet Transfer: Minimal assistance, RW, Ambulation Toilet Transfer Details (indicate cue type and reason): Difficulty controlling RW  with R UE with decreased active movement this session as compared to eval. Provided R sided RW splint and educated pt on use. She verbalizes understanding. Educated on need to perform skin checks when utilizing to prevent skin breakdown. Toileting- Clothing Manipulation and Hygiene: Moderate assistance, Sit to/from stand Functional mobility during ADLs: Minimal assistance General ADL Comments: Pt reporting worsening vision on R side this session and decreased functional use of R hand. Provided HEP for R UE including lap slides (pronated and with wrist neutral) as  well as composite flexion/extension. Pt able to complete 5 repetitions of AAROM in shoulder flexion, horizontal abduction/adduction, elbow flexion, forearm supination/pronation, and wrist flexion/extension.    Mobility  Overal bed mobility: Needs Assistance Bed Mobility: Sit to Supine Supine to sit: Min assist Sit to supine: Mod assist General bed mobility comments: Pt required assist for RLE lift into bed and RUE management during bed mobility. Educated pt on hooking LLE under RLE to assist with elevation.     Transfers  Overall transfer level: Needs assistance Equipment used: Rolling walker (2 wheeled) (grip assist on RUE on RW ) Transfers: Sit to/from Stand, [employer] Transfers Sit to Stand: Min assist Stand pivot transfers: Min assist General transfer comment: Verbal cues for hand placement. Assiste required for RUE placement on RW. Verbal cues for placing RLE in same position as LLE to encourage weightshift and muscle activation in RLE during transfer. Min A for lift assist and steadying during transfer.     Ambulation / Gait / Stairs / Wheelchair Mobility  Ambulation/Gait Ambulation/Gait assistance: Min guard, Min assist, +2 physical assistance Ambulation Distance (Feet): 100 Feet Assistive device: Rolling walker (2 wheeled) (Grip assist on RW for RUE. ) Gait Pattern/deviations: Decreased stride length, Decreased dorsiflexion - right, Decreased weight shift to right, Trunk flexed, Step-to pattern General Gait Details: Use of RW and grip assist for RUE on RW. Pt able to maintain RUE placement on RW with grip assist and strap. Pt required standing rest break ~50' secondary to muscle fatigue. Verbal cues for upright posture and appropriate proximity to RW during gait.  Gait velocity: decreased Gait velocity interpretation: Below normal speed for age/gender    Posture / Balance Balance Overall balance assessment: Needs assistance Sitting-balance support: No upper extremity supported,  Feet supported Sitting balance-Leahy Scale: Fair Standing balance support: Bilateral upper extremity supported Standing balance-Leahy Scale: Poor Standing balance comment: Reliant on RW for stability     Special needs/care consideration BiPAP/CPAP  N/a CPM  N/a Continuous Drip IV  N/a Dialysis  N/a Life Vest  N/a Oxygen  N/a Special Bed  N/a Trach Size  N/a Wound Vac (area)  N/a Skin intact                         Bowel mgmt: LBM 03/01/17 continent Bladder mgmt: continent Diabetic mgmt  N/a   Previous Home Environment Living Arrangements: Alone  Lives With: Alone Available Help at Discharge:  (Mom) Type of Home: Apartment Home Layout: One level Home Access: Level entry Bathroom Shower/Tub: Tub/shower unit, Engineer, building services: Standard Bathroom Accessibility: Yes How Accessible: Accessible via walker Home Care Services: No  Discharge Living Setting Plans for Discharge Living Setting: Patient's home, Alone Type of Home at Discharge: Apartment Discharge Home Layout: One level Discharge Home Access: Level entry Discharge Bathroom Shower/Tub: Tub/shower unit, Curtain Discharge Bathroom Toilet: Standard Discharge Bathroom Accessibility: Yes How Accessible: Accessible via walker Does the patient have any problems obtaining your medications?:  No  Social/Family/Support Systems Contact Information: [name] Anticipated Caregiver: Mom, has R foot boot to be removed likely 4/20 Anticipated Caregiver's Contact Information: see above Ability/Limitations of Caregiver: Mom has R foot boot after ortho surgery Caregiver Availability: 24/7 Discharge Plan Discussed with Primary Caregiver: Yes Is Caregiver In Agreement with Plan?: Yes Does Caregiver/Family have Issues with Lodging/Transportation while Pt is in Rehab?: No  Goals/Additional Needs Patient/Family Goal for Rehab: Mod I PT, Mod I to supervision OT, Mod I SLP Expected length of stay: ELOS 10- 12 days Pt/Family  Agrees to Admission and willing to participate: Yes Program Orientation Provided & Reviewed with Pt/Caregiver Including Roles  & Responsibilities: Yes Additional Information Needs: Pt needs new PCP, does not want Dr. Billee Cashing  She [facility], but needs PCP  Decrease burden of Care through IP rehab admission: n/a  Possible need for SNF placement upon discharge: not anticipated  Patient Condition: This patient's medical and functional status has changed since the consult dated: 02/27/2017 in which the Rehabilitation Physician determined and documented that the patient's condition is appropriate for intensive rehabilitative care in an inpatient rehabilitation facility. See "History of Present Illness" (above) for medical update. Functional changes are: min assist. Patient's medical and functional status update has been discussed with the Rehabilitation physician and patient remains appropriate for inpatient rehabilitation. Will admit to inpatient rehab today.  Preadmission Screen Completed By:  Clois Dupes, 03/02/2017 10:11 AM ______________________________________________________________________   Discussed status with Dr. Wynn Banker on 03/02/2017 at  1009 and received telephone approval for admission today.  Admission Coordinator:  Clois Dupes, time 1009 Date 03/02/2017 .

## 2017-03-01 NOTE — Progress Notes (Signed)
STROKE TEAM PROGRESS NOTE   HISTORY OF PRESENT ILLNESS (per record) Sharon Chan is a 41 y.o. female who states that yesterday she had some numbness in the right leg around the knee causing her leg to buckle.  She thought it was because she was walking too much and ignored it.  This morning she woke up at 8 am feeling OK overall except for some possible mild right face and hand numbness. She got out of bed at 11 am and then noticed that she has significant right sided numbness and tingling.  Upon arrival to the ER she had very high BP.  No prior history of hypertension.  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  There was a hypodensity in the left thalamus of unclear age.  CTA Brain showed multiple intracranial stenosis which could be [place] disease related vs other differentials such as CNS vasculitis.  CTA Neck is normal.  She was a vague historian and her neurological was bizarre in presentation as it was associated with confusion and encephalopathy.  Therefore, I did a stat MRI Brain which showed an acute infarct in the left thalamus.  MRA showed similar beading of intracranial vessels. She was not taking any medications at home, including no antiplatelets.  She had not had any major sickle cell crisis for many years.    She has no known hyperlipidemia, hypertension, diabetes, and she is not a smoker.  She denies illicit drug use.   Patient received total of 30 mg IV Labetolol but her BP remained outside of the parameters for IV tPA.     SUBJECTIVE (INTERVAL HISTORY)  .  She states She has subjective improvement of her right-sided strength as well as new left hand and arm pain .Lab work for HIV and syphilis is negative but lupus and Lyme titers and angiotensin-converting enzyme levels are yet not back  OBJECTIVE Temp:  [97 F (36.1 C)-98.6 F (37 C)] 97 F (36.1 C) (04/18 0727) Pulse Rate:  [71-89] 89 (04/18 0707) Cardiac Rhythm: Normal sinus  rhythm (04/18 0800) Resp:  [16-20] 16 (04/18 0549) BP: (130-161)/(80-100) 157/96 (04/18 0707) SpO2:  [96 %-99 %] 99 % (04/18 0707)  CBC:   Recent Labs Lab 02/27/17 1036 03/01/17 0512  WBC 12.5* 8.7  NEUTROABS 9.5* 3.8  HGB 12.9 11.8*  HCT 39.2 35.7*  MCV 76.1* 75.6*  PLT 287 285    Basic Metabolic Panel:   Recent Labs Lab 02/25/17 1310 02/25/17 1834 02/27/17 1036  NA 140  --  137  K 4.0  --  3.7  CL 104  --  101  CO2 26  --  26  GLUCOSE 134*  --  153*  BUN 10  --  16  CREATININE 1.08* 0.91 0.91  CALCIUM 9.2  --  9.3  MG  --  2.0  --     Lipid Panel:     Component Value Date/Time   CHOL 199 02/26/2017 0341   TRIG 134 02/26/2017 0341   HDL 37 (L) 02/26/2017 0341   CHOLHDL 5.4 02/26/2017 0341   VLDL 27 02/26/2017 0341   LDLCALC 135 (H) 02/26/2017 0341   HgbA1c:  Lab Results  Component Value Date   HGBA1C 6.3 (H) 02/26/2017   Urine Drug Screen:     Component Value Date/Time   LABOPIA POSITIVE (A) 02/26/2017 0147   COCAINSCRNUR NONE DETECTED 02/26/2017 0147   LABBENZ NONE DETECTED 02/26/2017 0147   AMPHETMU NONE DETECTED  02/26/2017 0147   THCU NONE DETECTED 02/26/2017 0147   LABBARB NONE DETECTED 02/26/2017 0147    Alcohol Level No results found for: ETH  IMAGING  Ct Angio Head W Or Wo Contrast Ct Angio Neck W And/or Wo Contrast 02/25/2017 1. No atherosclerosis or stenosis in the neck, ICA siphons, or basilar artery. 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 Still, there is no emergent large vessel occlusion or target for endovascular intervention identified.  2. Moderate or severe stenoses of the: - Left PCA P1 and P2, - Right MCA origin and M1, - Left ACA A2 and bilateral distal pericallosal arteries, - Left MCA M2 and M3.  3. Left thyroid 18 mm nodule meets consensus criteria for nonemergent follow-up   Thyroid Ultrasound characterization.    Dg Chest 2 View 02/25/2017 No active  cardiopulmonary disease.  Mild left basilar atelectasis.    Mr Maxine Glenn Head Wo Contrast 02/25/2017 1. 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  2. Remote infarcts involving the basal ganglia bilaterally.  3. Scattered white matter changes bilaterally are mildly advanced for age. This likely also reflects the sequela of microvascular ischemia or vasculitis.  4. Remote infarcts involving the body of the corpus callosum.  5. Extensive medium and small vessel disease evident on the MRA as described.    Ct Head Code Stroke W/o Cm 02/25/2017 1. No acute cortically based infarct or acute intracranial hemorrhage. ASPECTS is 10.  2. Evidence of scattered small vessel ischemia, with chronic appearing involvement of the corpus callosum and right basal ganglia. Small age indeterminate area in the left thalamus.     PHYSICAL EXAM Young obese african [place] lady not in distress. . Afebrile. Head is nontraumatic. Neck is supple without bruit.    Cardiac exam no murmur or gallop. Lungs are clear to auscultation. Distal pulses are well felt.  Neurological Exam :  Awake alert oriented x3. Normal speech and language. No dysarthria or aphasia. Fundi not visualized. Extraocular movements are full range without nystagmus. Blinks to threat bilaterally. Vision acuity and fields appear norma Right lower facial weakness. Tongue midline. Motor exam shows mild right hemiparesis with mild right upper extremity drift. Weakness of right grip and intrinsic hand muscles Fine finger movements are diminished on right. Orbits left over right upper extremity. Diminished right hemibody sensation including lower face.gait deferred. Coordination diminished on right. Rt Plantar is upgoing and left is downgoing.     ASSESSMENT/PLAN [name] is a 41 y.o. female with history of sickle cell disease and polycystic disease of the ovaries presenting with right sided numbness and  weakness in the setting of severe hypertension 230/110. She did not receive IV t-PA due to uncontrolled hypertension and unclear time of initial onset.    Strokes: acute nonhemorrhagic infarcts involving the left thalamus secondary to small vessel disease versus ?Sickle-cell vasculopathy  Resultant  Right hemibody sensory  and mild right hemiparesis  MRI - Acute nonhemorrhagic infarcts involving the left thalamus. Multiple remote infarcts.  MRA - Extensive medium and small vessel disease evident on the MRA.  CTA H&N - widespread cerebrovascular disease.  Carotid Doppler - CTA neck  2D Echo - pending  LDL - 135  UDS - pending  [id number] pending  VTE prophylaxis - subcutaneous heparin Diet regular Room service appropriate? Yes; Fluid consistency: Thin  No antithrombotic prior to admission, now on aspirin 325 mg daily  Patient counseled to be compliant  with her antithrombotic medications  Ongoing aggressive stroke risk factor management  Therapy recommendations:  CIR recommended. MD consult ordered.  Disposition: Pending  Hypertension  Stable - 151/91  Permissive hypertension (OK if < 220/120) but gradually normalize in 5-7 days  Long-term BP goal normotensive  Hyperlipidemia  Home meds: No lipid lowering medications prior to admission  LDL 135, goal < 70   Lipitor 40 mg daily ( may need to increase to 80 mg daily )  Continue statin at discharge    Other Stroke Risk Factors  Obesity, Body mass index is 40.14 kg/m., recommend weight loss, diet and exercise as appropriate   Hx stroke/TIA - by imaging    Other Active Problems  Left thyroid 18 mm nodule meets consensus criteria for nonemergent follow-up   May need to consider dual antiplatelet therapy secondary to widespread cerebrovascular disease.  Hospital day # 4   Plan transfer to [facility] when bed available. f/u in stroke clinic in 6 weeks. Delia Heady, MD Medical Director [facility] Stroke  Center Pager: [phone] 03/01/2017 2:46 PM To contact Stroke Continuity provider, please refer to [email or web address]. After hours, contact General Neurology

## 2017-03-01 NOTE — Progress Notes (Signed)
I contacted Dr. Ashley Royalty to clarify if pt medically ready to d/c to inpt rehab today for I do have a bed available to admit. Opthalmology to see pt today and then pt would be ready for d/c. I will follow up today. Pt is aware and in agreement. [phone]

## 2017-03-01 NOTE — Clinical Social Work Note (Signed)
Patient discharging to CIR today.  CSW signing off.   Myley Bahner, CSW [phone]  

## 2017-03-01 NOTE — Care Management Important Message (Signed)
Important Message  Patient Details  Name: Sharon Chan MRN: [id number] Date of Birth: May 03, 1976   Medicare Important Message Given:  Yes    Ashvin Adelson Stefan Church 03/01/2017, 12:25 PM

## 2017-03-01 NOTE — Progress Notes (Signed)
SICKLE CELL SERVICE PROGRESS NOTE  Sharon Chan LKJ:[id number] DOB: [birth year] DOA: 02/25/2017 PCP: Sharon Hey, MD  Assessment/Plan: Principal Problem:   Stroke (cerebrum) ([place]) Active Problems:   Sickle cell disease ([place])   Stroke ([place])   Acute ischemic stroke ([place])   Dysphagia, post-stroke   Benign essential HTN   Polycystic kidney disease   Prediabetes   Nonintractable headache   Leukocytosis   Sickle cell crisis ([place])   Changes in vision   Hyperlipidemia   Thyroid nodule  1. Sickle Cell Trait:Hemoglobin electrophoresis resulted today and consistent with trait. Will discontinue Toradol and Dilaudid and continue Oxycodone on a PRN basis. 2. Anemia: Now that the diagnosis of SCD has been dispelled, it is unclear the cause of her anemia. Will obtain iron studies and send stool for occult blood.  3. Joint pains: Pt has an elevated ESR and her mother who is present today states that she was told that her daughter had JRA. However another Physician told them that is was sickle cell disease and not JRA. Mother states that the patient was admitted several times by the Physician for "Sickle Cell Crisis".  Thus they never pursued the diagnosis as the pain was attributed to Sickle Cell Disease. 4. Acute Vision Changes: Opthalmology to see patient today.  5. Acute Non-hemorrhagic CVA: Continue ASA and Statin.  6. PCOS: Pt reports PCOS. Given the misinformation with Sickle Cell I would advise that the diagnosis be confirmed or refuted with Trans-vaginal U/S as an outpatient given the co-morbdity risk associated with PCOS. 7. Thyroid Nodule: TSH normal. Follow up as out patient.   Code Status: Full Code Family Communication: Mother at bedside and updated on results.  Disposition Plan: Medically stable discharge tomorrow.   [name],[name]  Pager [phone]. If 7PM-7AM, please contact night-coverage.  03/01/2017, 5:38 PM  LOS: 4 days   Interim History: Pt reports that her  vision loss persists. Her pain has improved.   Consultants:  Dr. Sethi-Neurology  Procedures:  None  Antibiotics:  None   Objective: Vitals:   03/01/17 0051 03/01/17 0549 03/01/17 0707 03/01/17 0727  BP: 130/80 (!) 160/100 (!) 157/96   Pulse: 79 79 89   Resp: 18 16    Temp: 98.1 F (36.7 C) 98.2 F (36.8 C) 97 F (36.1 C) 97 F (36.1 C)  TempSrc: Oral Oral Oral   SpO2: 96% 97% 99%   Weight:      Height:       Weight change:  No intake or output data in the 24 hours ending 03/01/17 1738  General: Alert, awake, oriented x3, in no acute distress.  HEENT: [place]/AT PEERL, EOMI Neck: Trachea midline,  no masses, no thyromegal,y no JVD, no carotid bruit OROPHARYNX:  Moist, No exudate/ erythema/lesions.  Heart: Regular rate and rhythm, without murmurs, rubs, gallops, PMI non-displaced, no heaves or thrills on palpation.  Lungs: Clear to auscultation, no wheezing or rhonchi noted. No increased vocal fremitus resonant to percussion  Abdomen: Soft, nontender, nondistended, positive bowel sounds, no masses no hepatosplenomegaly noted..  Neuro: No change in the right sided weakness.  Musculoskeletal: No warmth swelling or erythema around joints, no spinal tenderness noted. Psychiatric: Patient alert and oriented x3, good insight and cognition, good recent to remote recall. Lymph node survey: No cervical axillary or inguinal lymphadenopathy noted.   Data Reviewed: Basic Metabolic Panel:  Recent Labs Lab 02/25/17 1307 02/25/17 1310 02/25/17 1834 02/27/17 1036  NA 142 140  --  137  K 3.6 4.0  --  3.7  CL 106 104  --  101  CO2  --  26  --  26  GLUCOSE 124* 134*  --  153*  BUN 13 10  --  16  CREATININE 1.00 1.08* 0.91 0.91  CALCIUM  --  9.2  --  9.3  MG  --   --  2.0  --    Liver Function Tests:  Recent Labs Lab 02/25/17 1310  AST 20  ALT 19  ALKPHOS 76  BILITOT 0.3  PROT 6.4*  ALBUMIN 3.6   No results for input(s): LIPASE, AMYLASE in the last 168 hours. No  results for input(s): AMMONIA in the last 168 hours. CBC:  Recent Labs Lab 02/25/17 1307 02/25/17 1310 02/25/17 1834 02/27/17 1036 03/01/17 0512  WBC  --  8.0 8.3 12.5* 8.7  NEUTROABS  --  3.5  --  9.5* 3.8  HGB 14.6 13.1 13.3 12.9 11.8*  HCT 43.0 39.4 39.8 39.2 35.7*  MCV  --  76.2* 75.2* 76.1* 75.6*  PLT  --  297 303 287 285   Cardiac Enzymes: No results for input(s): CKTOTAL, CKMB, CKMBINDEX, TROPONINI in the last 168 hours. BNP (last 3 results) No results for input(s): BNP in the last 8760 hours.  ProBNP (last 3 results) No results for input(s): PROBNP in the last 8760 hours.  CBG: No results for input(s): GLUCAP in the last 168 hours.  No results found for this or any previous visit (from the past 240 hour(s)).   Studies: Ct Angio Head W Or Wo Contrast  Addendum Date: 02/25/2017   ADDENDUM REPORT: 02/25/2017 14:30 ADDENDUM: Preliminary report of this exam was discussed by telephone with Dr. Alessandra Bevels on 02/25/2017 at 1343 hours. Electronically Signed   By: Genevie Ann M.D.   On: 02/25/2017 14:30   Result Date: 02/25/2017 CLINICAL DATA:  41 year old female with RIGHT side weakness. Confusion. Sickle cell disease. EXAM: CT ANGIOGRAPHY HEAD AND NECK TECHNIQUE: Multidetector CT imaging of the head and neck was performed using the standard protocol during bolus administration of intravenous contrast. Multiplanar CT image reconstructions and MIPs were obtained to evaluate the vascular anatomy. Carotid stenosis measurements (when applicable) are obtained utilizing NASCET criteria, using the distal internal carotid diameter as the denominator. CONTRAST:  100 mL Isovue 370 COMPARISON:  Head CT without contrast 1259 hours today. FINDINGS: CTA NECK Skeleton: No acute osseous abnormality identified. Cervical and thoracic endplates are relatively preserved. Visualized paranasal sinuses and mastoids are stable and well pneumatized. Upper chest: Partially visible cardiomegaly. Negative  visualized thoracic aorta. No superior mediastinal lymphadenopathy. Negative visible lung parenchyma except for atelectasis. Other neck: Thyroid remarkable for 18 mm hypodense nodule on the left. Negative larynx, pharynx, parapharyngeal spaces, retropharyngeal space (partially retropharyngeal right carotid), sublingual space, submandibular glands and parotid glands. No cervical lymphadenopathy. Aortic arch: 3 vessel arch configuration, no atherosclerosis. Enhancement of small venous collaterals about the right shoulder. 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 Right carotid system: Mildly tortuous brachiocephalic artery with no stenosis. Negative proximal right CCA. Partially retropharyngeal right carotid bifurcation. Negative cervical right ICA aside from mild tortuosity. Left carotid system: Negative. Vertebral arteries:No proximal right subclavian stenosis. Non dominant right vertebral artery. Right vertebral artery origin and cervical right vertebral arteries are within normal limits. No proximal left subclavian artery stenosis. Dominant left vertebral artery. No left vertebral origin stenosis. Tortuous left V1 segment. Mildly tortuous left V2 segment, no stenosis  to the skullbase. CTA HEAD Posterior circulation: Distal left vertebral artery is dominant. No distal vertebral stenosis. Both PICA origins are patent. No basilar stenosis. Normal SCA origins. Normal left PCA origin. Fetal type right PCA. Left posterior communicating artery is present. Mild right P1 and P2 and moderate to severe left P1 and P2, irregularity and stenosis as seen on series 407, image 52. Preserved bilateral distal PCA flow. Anterior circulation: Both ICA siphons are patent without atherosclerosis or stenosis. Normal ophthalmic and posterior communicating artery origins. Patent carotid termini. However, there is moderate irregularity and stenosis at the right  M1 origin (series 403, image 108). The right MCA M1 segment is patent but there is moderate to severe irregularity and stenosis also in the mid M1 (series 403, image 103 and see also series 407, image 52). Despite this the right MCA bifurcation is patent. No right M2 branch occlusion identified. Right MCA M2 and M3 branches are within normal limits. Left MCA origin and proximal M1 segment are normal. There is mild irregularity in the mid left M1 segment. The left MCA bifurcation is patent. There is moderate left M2 and moderate to severe left M3 irregularity and stenosis as seen on series 406, image 32. ACA origins and anterior communicating arteries are within normal limits. Proximal A2 branches are within normal limits but there is severe left ACA A2 segment stenosis near the callosomarginal artery, and moderate to severe bilateral distal ACA/pericallosal artery irregularity and stenosis (series 406, image 25). Venous sinuses: Patent. Anatomic variants: Dominant left vertebral artery. Fetal type right PCA origin. Review of the MIP images confirms the above findings IMPRESSION: 1. No atherosclerosis or stenosis in the neck, ICA siphons, or basilar artery. 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 Still, there is no emergent large vessel occlusion or target for endovascular intervention identified. 2. Moderate or severe stenoses of the: - Left PCA P1 and P2, - Right MCA origin and M1, - Left ACA A2 and bilateral distal pericallosal arteries, - Left MCA M2 and M3. 3. Left thyroid 18 mm nodule meets consensus criteria for nonemergent follow-up Thyroid Ultrasound characterization. Electronically Signed: By: Genevie Ann M.D. On: 02/25/2017 13:57   Dg Chest 2 View  Result Date: 02/25/2017 CLINICAL DATA:  Right side numbness, facial droop, history of sickle cell disease EXAM: CHEST  2 VIEW COMPARISON:  05/07/2006 FINDINGS: Borderline cardiomegaly. Right IJ  Port-A-Cath with tip in upper SVC is unchanged in position. No acute infiltrate or pulmonary edema. Mild left basilar atelectasis. Mild degenerative changes mid thoracic spine. IMPRESSION: No active cardiopulmonary disease.  Mild left basilar atelectasis. Electronically Signed   By: Lahoma Crocker M.D.   On: 02/25/2017 17:19   Ct Angio Neck W And/or Wo Contrast  Addendum Date: 02/25/2017   ADDENDUM REPORT: 02/25/2017 14:30 ADDENDUM: Preliminary report of this exam was discussed by telephone with Dr. Alessandra Bevels on 02/25/2017 at 1343 hours. Electronically Signed   By: Genevie Ann M.D.   On: 02/25/2017 14:30   Result Date: 02/25/2017 CLINICAL DATA:  41 year old female with RIGHT side weakness. Confusion. Sickle cell disease. EXAM: CT ANGIOGRAPHY HEAD AND NECK TECHNIQUE: Multidetector CT imaging of the head and neck was performed using the standard protocol during bolus administration of intravenous contrast. Multiplanar CT image reconstructions and MIPs were obtained to evaluate the vascular anatomy. Carotid stenosis measurements (when applicable) are obtained utilizing NASCET criteria, using the distal internal carotid diameter as the denominator. CONTRAST:  100 mL  Isovue 370 COMPARISON:  Head CT without contrast 1259 hours today. FINDINGS: CTA NECK Skeleton: No acute osseous abnormality identified. Cervical and thoracic endplates are relatively preserved. Visualized paranasal sinuses and mastoids are stable and well pneumatized. Upper chest: Partially visible cardiomegaly. Negative visualized thoracic aorta. No superior mediastinal lymphadenopathy. Negative visible lung parenchyma except for atelectasis. Other neck: Thyroid remarkable for 18 mm hypodense nodule on the left. Negative larynx, pharynx, parapharyngeal spaces, retropharyngeal space (partially retropharyngeal right carotid), sublingual space, submandibular glands and parotid glands. No cervical lymphadenopathy. Aortic arch: 3 vessel arch configuration, no  atherosclerosis. Enhancement of small venous collaterals about the right shoulder. 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 Right carotid system: Mildly tortuous brachiocephalic artery with no stenosis. Negative proximal right CCA. Partially retropharyngeal right carotid bifurcation. Negative cervical right ICA aside from mild tortuosity. Left carotid system: Negative. Vertebral arteries:No proximal right subclavian stenosis. Non dominant right vertebral artery. Right vertebral artery origin and cervical right vertebral arteries are within normal limits. No proximal left subclavian artery stenosis. Dominant left vertebral artery. No left vertebral origin stenosis. Tortuous left V1 segment. Mildly tortuous left V2 segment, no stenosis to the skullbase. CTA HEAD Posterior circulation: Distal left vertebral artery is dominant. No distal vertebral stenosis. Both PICA origins are patent. No basilar stenosis. Normal SCA origins. Normal left PCA origin. Fetal type right PCA. Left posterior communicating artery is present. Mild right P1 and P2 and moderate to severe left P1 and P2, irregularity and stenosis as seen on series 407, image 52. Preserved bilateral distal PCA flow. Anterior circulation: Both ICA siphons are patent without atherosclerosis or stenosis. Normal ophthalmic and posterior communicating artery origins. Patent carotid termini. However, there is moderate irregularity and stenosis at the right M1 origin (series 403, image 108). The right MCA M1 segment is patent but there is moderate to severe irregularity and stenosis also in the mid M1 (series 403, image 103 and see also series 407, image 52). Despite this the right MCA bifurcation is patent. No right M2 branch occlusion identified. Right MCA M2 and M3 branches are within normal limits. Left MCA origin and proximal M1 segment are normal. There is mild irregularity in the mid  left M1 segment. The left MCA bifurcation is patent. There is moderate left M2 and moderate to severe left M3 irregularity and stenosis as seen on series 406, image 32. ACA origins and anterior communicating arteries are within normal limits. Proximal A2 branches are within normal limits but there is severe left ACA A2 segment stenosis near the callosomarginal artery, and moderate to severe bilateral distal ACA/pericallosal artery irregularity and stenosis (series 406, image 25). Venous sinuses: Patent. Anatomic variants: Dominant left vertebral artery. Fetal type right PCA origin. Review of the MIP images confirms the above findings IMPRESSION: 1. No atherosclerosis or stenosis in the neck, ICA siphons, or basilar artery. 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 Still, there is no emergent large vessel occlusion or target for endovascular intervention identified. 2. Moderate or severe stenoses of the: - Left PCA P1 and P2, - Right MCA origin and M1, - Left ACA A2 and bilateral distal pericallosal arteries, - Left MCA M2 and M3. 3. Left thyroid 18 mm nodule meets consensus criteria for nonemergent follow-up Thyroid Ultrasound characterization. Electronically Signed: By: Genevie Ann M.D. On: 02/25/2017 13:57   Mr Jodene Nam Head Wo Contrast  Result Date:  02/25/2017 CLINICAL DATA:  Sudden onset of right upper and lower extremity weakness and facial droop. Hypertensive crisis. Sickle cell disease. EXAM: MRI HEAD WITHOUT CONTRAST MRA HEAD WITHOUT CONTRAST TECHNIQUE: Multiplanar, multiecho pulse sequences of the brain and surrounding structures were obtained without intravenous contrast. Angiographic images of the head were obtained using MRA technique without contrast. COMPARISON:  CT head without contrast and CTA head and neck from the same day. FINDINGS: MRI HEAD FINDINGS Brain: Diffusion-weighted images demonstrate restricted diffusion within the 80 left lack infarct  noted on CT. Areas of T2 shine through or noted in the corona radiata bilaterally. No additional acute infarcts are present. There are remote infarcts involving the right basal ganglia. Scattered subcortical T2 hyperintensities are highly advanced for age. Bilateral T2 hyperintensities are present within the corona radiata. A remote infarct is present in the genu of the left internal capsule. A remote infarct is noted along the left side of the body of the corpus callosum. Vascular: Flow is present in the major intracranial arteries. Skull and upper cervical spine: The skullbase is within normal limits. The craniocervical junction is unremarkable. Midline sagittal structures are within normal limits. The upper cervical spine is normal. Sinuses/Orbits: The paranasal sinuses and mastoid air cells are clear. The globes and orbits are within normal limits MRA HEAD FINDINGS The internal carotid arteries are within normal limits the high cervical segments through the ICA termini bilaterally. Mild narrowing is again seen at the origins of the right A1 and M1 segments. There is moderate narrowing of the distal right M1 segment. Segmental narrowing present throughout the MCA branch vessels bilaterally. The left vertebral artery is the dominant vessel. PICA origins are visualized and normal bilaterally. The basilar artery is normal. A moderate stenosis present proximal left posterior cerebral artery. The right posterior cerebral artery is of fetal type. There is cecum at attenuation distal PCA branch vessels bilaterally. IMPRESSION: 1. 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 2. Remote infarcts involving the basal ganglia bilaterally. 3. Scattered white matter changes bilaterally are mildly advanced for age. This likely also reflects the sequela of microvascular ischemia or vasculitis. 4. Remote infarcts involving the body of the corpus callosum. 5. Extensive medium and small vessel  disease evident on the MRA as described. Electronically Signed   By: San Morelle M.D.   On: 02/25/2017 15:07   Mr Brain Wo Contrast  Result Date: 02/25/2017 CLINICAL DATA:  Sudden onset of right upper and lower extremity weakness and facial droop. Hypertensive crisis. Sickle cell disease. EXAM: MRI HEAD WITHOUT CONTRAST MRA HEAD WITHOUT CONTRAST TECHNIQUE: Multiplanar, multiecho pulse sequences of the brain and surrounding structures were obtained without intravenous contrast. Angiographic images of the head were obtained using MRA technique without contrast. COMPARISON:  CT head without contrast and CTA head and neck from the same day. FINDINGS: MRI HEAD FINDINGS Brain: Diffusion-weighted images demonstrate restricted diffusion within the 80 left lack infarct noted on CT. Areas of T2 shine through or noted in the corona radiata bilaterally. No additional acute infarcts are present. There are remote infarcts involving the right basal ganglia. Scattered subcortical T2 hyperintensities are highly advanced for age. Bilateral T2 hyperintensities are present within the corona radiata. A remote infarct is present in the genu of the left internal capsule. A remote infarct is noted along the left side of the body of the corpus callosum. Vascular: Flow is present in the major intracranial arteries. Skull and upper cervical spine: The skullbase is within normal  limits. The craniocervical junction is unremarkable. Midline sagittal structures are within normal limits. The upper cervical spine is normal. Sinuses/Orbits: The paranasal sinuses and mastoid air cells are clear. The globes and orbits are within normal limits MRA HEAD FINDINGS The internal carotid arteries are within normal limits the high cervical segments through the ICA termini bilaterally. Mild narrowing is again seen at the origins of the right A1 and M1 segments. There is moderate narrowing of the distal right M1 segment. Segmental narrowing present  throughout the MCA branch vessels bilaterally. The left vertebral artery is the dominant vessel. PICA origins are visualized and normal bilaterally. The basilar artery is normal. A moderate stenosis present proximal left posterior cerebral artery. The right posterior cerebral artery is of fetal type. There is cecum at attenuation distal PCA branch vessels bilaterally. IMPRESSION: 1. 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 2. Remote infarcts involving the basal ganglia bilaterally. 3. Scattered white matter changes bilaterally are mildly advanced for age. This likely also reflects the sequela of microvascular ischemia or vasculitis. 4. Remote infarcts involving the body of the corpus callosum. 5. Extensive medium and small vessel disease evident on the MRA as described. Electronically Signed   By: San Morelle M.D.   On: 02/25/2017 15:07   Ct Head Code Stroke W/o Cm  Result Date: 02/25/2017 CLINICAL DATA:  Code stroke. 41 year old female last seen normal this morning. Left facial droop and left side numbness. Initial encounter. Sickle cell disease EXAM: CT HEAD WITHOUT CONTRAST TECHNIQUE: Contiguous axial images were obtained from the base of the skull through the vertex without intravenous contrast. COMPARISON:  Report of brain MRI 07/11/1999 (no images available). FINDINGS: Brain: Overall normal cerebral volume. Chronic appearing lacunar infarct right basal ganglia. Small age indeterminate hypodensity left thalamus (series 21, image 14). Focal chronic appearing lacunar infarct in the body of the corpus callosum (coronal image 34). Other small areas of cerebral white matter hypodensity. No midline shift, ventriculomegaly, mass effect, evidence of mass lesion, intracranial hemorrhage or evidence of cortically based acute infarction. Vascular: No suspicious intracranial vascular hyperdensity. Skull: Negative. Sinuses/Orbits: Clear. Other: Negative orbit and scalp  soft tissues. ASPECTS [facility] Stroke Program Early CT Score) - Ganglionic level infarction (caudate, lentiform nuclei, internal capsule, insula, M1-M3 cortex): 7 - Supraganglionic infarction (M4-M6 cortex): 3 Total score (0-10 with 10 being normal): 10 IMPRESSION: 1. No acute cortically based infarct or acute intracranial hemorrhage. ASPECTS is 10. 2. Evidence of scattered small vessel ischemia, with chronic appearing involvement of the corpus callosum and right basal ganglia. Small age indeterminate area in the left thalamus. 3. The above was relayed via text pager to Dr. Alessandra Bevels on 02/25/2017 at 13:09 . Electronically Signed   By: Genevie Ann M.D.   On: 02/25/2017 13:09    Scheduled Meds: .  stroke: mapping our early stages of recovery book   Does not apply Once  . aspirin  300 mg Rectal Daily   Or  . aspirin  325 mg Oral Daily  . aspirin  325 mg Oral Once  . atorvastatin  40 mg Oral q1800  . heparin  5,000 Units Subcutaneous Q8H  . ketorolac  15 mg Intravenous Q6H  . vitamin B-12  100 mcg Oral Daily   Continuous Infusions:  Principal Problem:   Stroke (cerebrum) ([facility]) Active Problems:   Sickle cell triat   Stroke ([facility])   Acute ischemic stroke ([facility])   Dysphagia, post-stroke   Benign essential HTN   Polycystic  kidney disease   Prediabetes   Nonintractable headache   Leukocytosis   Changes in vision   Hyperlipidemia   Thyroid nodule     In excess of 35 minutes spent during this visit. Greater than 50% involved face to face contact with the patient for assessment, counseling and coordination of care.

## 2017-03-02 ENCOUNTER — Inpatient Hospital Stay (HOSPITAL_COMMUNITY): Payer: Medicare Other | Admitting: Occupational Therapy

## 2017-03-02 ENCOUNTER — Inpatient Hospital Stay (HOSPITAL_COMMUNITY)
Admission: RE | Admit: 2017-03-02 | Discharge: 2017-03-11 | DRG: 057 | Disposition: A | Discharge status: Home / Self Care | Payer: Medicare Other | Source: Intra-hospital | Attending: Physical Medicine & Rehabilitation | Admitting: Physical Medicine & Rehabilitation

## 2017-03-02 ENCOUNTER — Inpatient Hospital Stay (HOSPITAL_COMMUNITY): Payer: Medicare Other | Admitting: *Deleted

## 2017-03-02 ENCOUNTER — Encounter (HOSPITAL_COMMUNITY): Payer: Self-pay | Admitting: Internal Medicine

## 2017-03-02 DIAGNOSIS — D509 Iron deficiency anemia, unspecified: Secondary | ICD-10-CM | POA: Diagnosis not present

## 2017-03-02 DIAGNOSIS — I63411 Cerebral infarction due to embolism of right middle cerebral artery: Secondary | ICD-10-CM | POA: Diagnosis present

## 2017-03-02 DIAGNOSIS — E785 Hyperlipidemia, unspecified: Secondary | ICD-10-CM

## 2017-03-02 DIAGNOSIS — I679 Cerebrovascular disease, unspecified: Secondary | ICD-10-CM

## 2017-03-02 DIAGNOSIS — G44029 Chronic cluster headache, not intractable: Secondary | ICD-10-CM | POA: Diagnosis not present

## 2017-03-02 DIAGNOSIS — I69320 Aphasia following cerebral infarction: Secondary | ICD-10-CM

## 2017-03-02 DIAGNOSIS — K5900 Constipation, unspecified: Secondary | ICD-10-CM

## 2017-03-02 DIAGNOSIS — E041 Nontoxic single thyroid nodule: Secondary | ICD-10-CM

## 2017-03-02 DIAGNOSIS — H538 Other visual disturbances: Secondary | ICD-10-CM

## 2017-03-02 DIAGNOSIS — G8191 Hemiplegia, unspecified affecting right dominant side: Secondary | ICD-10-CM | POA: Diagnosis not present

## 2017-03-02 DIAGNOSIS — F4322 Adjustment disorder with anxiety: Secondary | ICD-10-CM

## 2017-03-02 DIAGNOSIS — I10 Essential (primary) hypertension: Secondary | ICD-10-CM | POA: Diagnosis present

## 2017-03-02 DIAGNOSIS — R1310 Dysphagia, unspecified: Secondary | ICD-10-CM | POA: Diagnosis not present

## 2017-03-02 DIAGNOSIS — M25551 Pain in right hip: Secondary | ICD-10-CM

## 2017-03-02 DIAGNOSIS — I69398 Other sequelae of cerebral infarction: Secondary | ICD-10-CM

## 2017-03-02 DIAGNOSIS — I69391 Dysphagia following cerebral infarction: Secondary | ICD-10-CM

## 2017-03-02 DIAGNOSIS — Z79899 Other long term (current) drug therapy: Secondary | ICD-10-CM

## 2017-03-02 DIAGNOSIS — I672 Cerebral atherosclerosis: Secondary | ICD-10-CM | POA: Diagnosis not present

## 2017-03-02 DIAGNOSIS — H539 Unspecified visual disturbance: Secondary | ICD-10-CM | POA: Diagnosis not present

## 2017-03-02 DIAGNOSIS — M25552 Pain in left hip: Secondary | ICD-10-CM

## 2017-03-02 DIAGNOSIS — D573 Sickle-cell trait: Secondary | ICD-10-CM | POA: Diagnosis present

## 2017-03-02 DIAGNOSIS — R200 Anesthesia of skin: Secondary | ICD-10-CM

## 2017-03-02 DIAGNOSIS — E784 Other hyperlipidemia: Secondary | ICD-10-CM | POA: Diagnosis not present

## 2017-03-02 DIAGNOSIS — Z6841 Body Mass Index (BMI) 40.0 and over, adult: Secondary | ICD-10-CM

## 2017-03-02 DIAGNOSIS — E282 Polycystic ovarian syndrome: Secondary | ICD-10-CM | POA: Diagnosis not present

## 2017-03-02 DIAGNOSIS — I69353 Hemiplegia and hemiparesis following cerebral infarction affecting right non-dominant side: Secondary | ICD-10-CM | POA: Diagnosis not present

## 2017-03-02 DIAGNOSIS — R5381 Other malaise: Secondary | ICD-10-CM

## 2017-03-02 DIAGNOSIS — Z7982 Long term (current) use of aspirin: Secondary | ICD-10-CM

## 2017-03-02 DIAGNOSIS — E781 Pure hyperglyceridemia: Secondary | ICD-10-CM | POA: Diagnosis not present

## 2017-03-02 DIAGNOSIS — R269 Unspecified abnormalities of gait and mobility: Secondary | ICD-10-CM

## 2017-03-02 DIAGNOSIS — D5700 Hb-SS disease with crisis, unspecified: Secondary | ICD-10-CM

## 2017-03-02 DIAGNOSIS — I639 Cerebral infarction, unspecified: Secondary | ICD-10-CM | POA: Diagnosis not present

## 2017-03-02 DIAGNOSIS — E669 Obesity, unspecified: Secondary | ICD-10-CM | POA: Diagnosis not present

## 2017-03-02 DIAGNOSIS — R202 Paresthesia of skin: Secondary | ICD-10-CM

## 2017-03-02 DIAGNOSIS — R51 Headache: Secondary | ICD-10-CM

## 2017-03-02 DIAGNOSIS — G44319 Acute post-traumatic headache, not intractable: Secondary | ICD-10-CM | POA: Diagnosis not present

## 2017-03-02 DIAGNOSIS — R519 Headache, unspecified: Secondary | ICD-10-CM | POA: Diagnosis present

## 2017-03-02 DIAGNOSIS — R4182 Altered mental status, unspecified: Secondary | ICD-10-CM | POA: Diagnosis not present

## 2017-03-02 LAB — ANTITHROMBIN III: ANTITHROMB III FUNC: 97 % (ref 75–120)

## 2017-03-02 MED ORDER — PROCHLORPERAZINE MALEATE 5 MG PO TABS: 5.0000 mg | ORAL_TABLET | Freq: Four times a day (QID) | ORAL | Status: DC | PRN

## 2017-03-02 MED ORDER — ATORVASTATIN CALCIUM 40 MG PO TABS: 40.0000 mg | ORAL_TABLET | Freq: Every day | ORAL | 1 refills | Status: DC

## 2017-03-02 MED ORDER — ALUM & MAG HYDROXIDE-SIMETH 200-200-20 MG/5ML PO SUSP: 30.0000 mL | ORAL | Status: DC | PRN

## 2017-03-02 MED ORDER — ATORVASTATIN CALCIUM 40 MG PO TABS
40.0000 mg | ORAL_TABLET | Freq: Every day | ORAL | Status: DC
  Administered 2017-03-02 – 2017-03-10 (×9): 40 mg via ORAL
  Filled 2017-03-02 (×9): qty 1

## 2017-03-02 MED ORDER — GUAIFENESIN-DM 100-10 MG/5ML PO SYRP: 5.0000 mL | ORAL_SOLUTION | Freq: Four times a day (QID) | ORAL | Status: DC | PRN

## 2017-03-02 MED ORDER — FLEET ENEMA 7-19 GM/118ML RE ENEM: 1.0000 | ENEMA | Freq: Once | RECTAL | Status: DC | PRN

## 2017-03-02 MED ORDER — CLONIDINE HCL 0.1 MG PO TABS
0.1000 mg | ORAL_TABLET | ORAL | Status: DC | PRN
Start: 2017-03-02 — End: 2017-03-11
  Administered 2017-03-03: 0.1 mg via ORAL
  Filled 2017-03-02: qty 1

## 2017-03-02 MED ORDER — DIPHENHYDRAMINE HCL 12.5 MG/5ML PO ELIX: 12.5000 mg | ORAL_SOLUTION | Freq: Four times a day (QID) | ORAL | Status: DC | PRN

## 2017-03-02 MED ORDER — PROCHLORPERAZINE 25 MG RE SUPP: 12.5000 mg | Freq: Four times a day (QID) | RECTAL | Status: DC | PRN

## 2017-03-02 MED ORDER — OXYCODONE HCL 5 MG PO TABS
5.0000 mg | ORAL_TABLET | Freq: Once | ORAL | Status: AC
  Administered 2017-03-02: 5 mg via ORAL
  Filled 2017-03-02: qty 1

## 2017-03-02 MED ORDER — ENOXAPARIN SODIUM 40 MG/0.4ML ~~LOC~~ SOLN
40.0000 mg | Freq: Every day | SUBCUTANEOUS | Status: DC
  Administered 2017-03-02 – 2017-03-10 (×9): 40 mg via SUBCUTANEOUS
  Filled 2017-03-02 (×9): qty 0.4

## 2017-03-02 MED ORDER — BISACODYL 10 MG RE SUPP
10.0000 mg | Freq: Every day | RECTAL | Status: DC | PRN
  Administered 2017-03-08: 10 mg via RECTAL
  Filled 2017-03-02: qty 1

## 2017-03-02 MED ORDER — VITAMIN B-12 100 MCG PO TABS
100.0000 ug | ORAL_TABLET | Freq: Every day | ORAL | Status: DC
  Administered 2017-03-03 – 2017-03-11 (×9): 100 ug via ORAL
  Filled 2017-03-02 (×10): qty 1

## 2017-03-02 MED ORDER — NAPHAZOLINE-GLYCERIN 0.012-0.2 % OP SOLN
1.0000 [drp] | Freq: Three times a day (TID) | OPHTHALMIC | Status: DC
  Administered 2017-03-03 (×2): 2 [drp] via OPHTHALMIC
  Administered 2017-03-03: 1 [drp] via OPHTHALMIC
  Administered 2017-03-04 (×2): 2 [drp] via OPHTHALMIC
  Administered 2017-03-04: 1 [drp] via OPHTHALMIC
  Administered 2017-03-05: 2 [drp] via OPHTHALMIC
  Administered 2017-03-05: 1 [drp] via OPHTHALMIC
  Administered 2017-03-05 – 2017-03-06 (×4): 2 [drp] via OPHTHALMIC
  Administered 2017-03-06: 1 [drp] via OPHTHALMIC
  Administered 2017-03-06: 2 [drp] via OPHTHALMIC
  Administered 2017-03-07: 1 [drp] via OPHTHALMIC
  Administered 2017-03-07: 2 [drp] via OPHTHALMIC
  Administered 2017-03-08: 1 [drp] via OPHTHALMIC
  Administered 2017-03-08: 2 [drp] via OPHTHALMIC
  Administered 2017-03-08: 1 [drp] via OPHTHALMIC
  Administered 2017-03-09 – 2017-03-10 (×6): 2 [drp] via OPHTHALMIC
  Filled 2017-03-02 (×2): qty 15

## 2017-03-02 MED ORDER — ACETAMINOPHEN 325 MG PO TABS
325.0000 mg | ORAL_TABLET | ORAL | Status: DC | PRN
  Administered 2017-03-03 – 2017-03-05 (×7): 650 mg via ORAL
  Administered 2017-03-06: 325 mg via ORAL
  Administered 2017-03-07 – 2017-03-11 (×8): 650 mg via ORAL
  Filled 2017-03-02 (×16): qty 2

## 2017-03-02 MED ORDER — TRAZODONE HCL 50 MG PO TABS
25.0000 mg | ORAL_TABLET | Freq: Every evening | ORAL | Status: DC | PRN
  Administered 2017-03-04 – 2017-03-10 (×7): 50 mg via ORAL
  Filled 2017-03-02 (×8): qty 1

## 2017-03-02 MED ORDER — ASPIRIN EC 325 MG PO TBEC
325.0000 mg | DELAYED_RELEASE_TABLET | Freq: Every day | ORAL | Status: DC
  Administered 2017-03-03 – 2017-03-11 (×9): 325 mg via ORAL
  Filled 2017-03-02 (×9): qty 1

## 2017-03-02 MED ORDER — ASPIRIN 325 MG PO TABS: 325.0000 mg | ORAL_TABLET | Freq: Every day | ORAL | 1 refills | Status: DC

## 2017-03-02 MED ORDER — TRAMADOL HCL 50 MG PO TABS
50.0000 mg | ORAL_TABLET | Freq: Four times a day (QID) | ORAL | Status: DC | PRN
  Administered 2017-03-02 – 2017-03-09 (×20): 50 mg via ORAL
  Filled 2017-03-02 (×21): qty 1

## 2017-03-02 MED ORDER — PROCHLORPERAZINE EDISYLATE 5 MG/ML IJ SOLN: 5.0000 mg | Freq: Four times a day (QID) | INTRAMUSCULAR | Status: DC | PRN

## 2017-03-02 MED ORDER — BOOST / RESOURCE BREEZE PO LIQD
1.0000 | Freq: Three times a day (TID) | ORAL | Status: DC
  Administered 2017-03-02 – 2017-03-10 (×19): 1 via ORAL

## 2017-03-02 MED ORDER — KETOROLAC TROMETHAMINE 30 MG/ML IJ SOLN
30.0000 mg | Freq: Once | INTRAMUSCULAR | Status: AC
  Administered 2017-03-02: 30 mg via INTRAVENOUS
  Filled 2017-03-02: qty 1

## 2017-03-02 MED ORDER — POLYETHYLENE GLYCOL 3350 17 G PO PACK
17.0000 g | PACK | Freq: Every day | ORAL | Status: DC | PRN
  Administered 2017-03-03 – 2017-03-09 (×5): 17 g via ORAL
  Filled 2017-03-02 (×5): qty 1

## 2017-03-02 NOTE — Care Management Note (Signed)
Case Management Note  Patient Details  Name: Sharon Chan MRN: [id number] Date of Birth: 01-Jul-1976  Subjective/Objective:                    Action/Plan: Pt discharging to CIR today. No further needs per CM.   Expected Discharge Date:  03/02/17               Expected Discharge Plan:  IP Rehab Facility  In-House Referral:     Discharge planning Services     Post Acute Care Choice:    Choice offered to:     DME Arranged:    DME Agency:     HH Arranged:    HH Agency:     Status of Service:  Completed, signed off  If discussed at [employer], dates discussed:    Additional Comments:  Kermit Balo, RN 03/02/2017, 9:23 AM

## 2017-03-02 NOTE — Progress Notes (Signed)
Ankit Karis Juba, MD Physician Signed Physical Medicine and Rehabilitation  Consult Note Date of Service: 02/27/2017 6:25 AM  Related encounter: ED to Hosp-Admission (Current) from 02/25/2017 in [facility]     Expand All Collapse All   Hide copied text Hover for attribution information      Physical Medicine and Rehabilitation Consult Reason for Consult: Decreased functional mobility with right sided weakness Referring Physician: [facility]   HPI: Sharon Chan is a 41 y.o. right handed female with history of polycystic disease and sickle cell disease. Per chart review and patient, patient lives alone and independent prior to admission still driving. One level home. She has a mother in the area who is out of work until June after recent surgery. Presented 02/25/2017 with right sided weakness, headache and elevated blood pressure 230/110. Cranial CT reviewed, negative for acute abnormalities. Per report, Evidence of scattered small vessel ischemia, with chronic appearing involvement of the corpus callosum also a right basal ganglia. Small age indeterminate area in the left thalamus. CT Angio head and neck showed moderate severe stenosis of the left PCA P1 and P2, right MCA origin and M1, left ACA A2 and bilateral distal pericallosal arteries, left MCA M2 and M3. No atherosclerosis or stenosis in the neck or basilar artery. No emergent large vessel occlusion. 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 Remote infarcts involving the basal ganglia bilaterally as well as remote infarcts involving the body of the corpus colostrum. MRA showed extensive medium and small vessel disease. Patient did not receive TPA. Echocardiogram with ejection fraction of 65% no wall motion abnormalities. Neurology consulted presently on aspirin for CVA prophylaxis. Subcutaneous heparin for DVT prophylaxis.  Dysphagia #2 thin liquid diet.   Review of Systems  Constitutional: Negative for chills and fever.  HENT: Negative for hearing loss.   Eyes: Negative for blurred vision and double vision.  Respiratory: Negative for cough and shortness of breath.   Cardiovascular: Positive for leg swelling. Negative for chest pain and palpitations.  Gastrointestinal: Positive for constipation. Negative for nausea and vomiting.  Genitourinary: Negative for dysuria, flank pain and hematuria.  Musculoskeletal: Positive for joint pain and myalgias.  Skin: Negative for rash.  Neurological: Positive for sensory change, focal weakness, weakness and headaches.  All other systems reviewed and are negative.      Past Medical History:  Diagnosis Date  . Polycystic disease, ovaries   . Sickle cell disease (HCC)    History reviewed. No pertinent surgical history related to stroke. History reviewed. No pertinent family history of premature CVA. Social History:  reports that she has never smoked. She has never used smokeless tobacco. She reports that she does not drink alcohol. Her drug history is not on file. Allergies: No Known Allergies       Medications Prior to Admission  Medication Sig Dispense Refill  . Cyanocobalamin (VITAMIN B-12 PO) Take 2 tablets by mouth daily.    Marland Kitchen MAGNESIUM PO Take 1 tablet by mouth at bedtime.      Home: Home Living Family/patient expects to be discharged to:: Private residence Living Arrangements: Alone Available Help at Discharge: Family, Available 24 hours/day Type of Home: Apartment Home Access: Level entry Home Layout: One level Bathroom Shower/Tub: Tub/shower unit, Engineer, building services: Standard Home Equipment: Environmental consultant - 2 wheels  Functional History: Prior Function Level of Independence: Independent Comments: Drives Functional Status:  Mobility: Bed Mobility Overal bed mobility:  Needs Assistance Bed Mobility: Supine to Sit Supine to sit: Min  assist General bed mobility comments: Pt OOB in chair at start of session Transfers Overall transfer level: Needs assistance Equipment used: 2 person hand held assist, Rolling walker (2 wheeled) Transfers: Sit to/from Stand Sit to Stand: Min assist, +2 physical assistance General transfer comment: Min assist with B UE support during sit<>stand required. Unable to stand without L UE support for balance.  VC's for hand placement.  Extra time required to position R hand. Ambulation/Gait Ambulation/Gait assistance: +2 physical assistance, Min assist, +2 safety/equipment Ambulation Distance (Feet): 70 Feet Assistive device: 2 person hand held assist, Rolling walker (2 wheeled) Gait Pattern/deviations: Step-to pattern, Decreased step length - right, Trunk flexed, Wide base of support, Step-through pattern General Gait Details: Pt initially required +2 hand held assist at waist level for leverage and VC's for step to pattern.  Pt able to push through R arm so after 10 ft switched to RW with assistance/instruction to grip walker with R hand.  Pt completed ambulation with walker and step through sequence, requiring +1 min assist and chair follow. Pt reliant on RW for balance and stability. Gait velocity: decreased Gait velocity interpretation: Below normal speed for age/gender  ADL: ADL Overall ADL's : Needs assistance/impaired Eating/Feeding: Minimal assistance, Sitting Grooming: Sitting, Moderate assistance, Oral care, Wash/dry face Upper Body Bathing: Moderate assistance, Sitting Lower Body Bathing: Sit to/from stand, Maximal assistance Upper Body Dressing : Moderate assistance, Sitting Lower Body Dressing: Maximal assistance, Sit to/from stand Toilet Transfer: Minimal assistance, RW, Ambulation Toilet Transfer Details (indicate cue type and reason): Did take a few steps from bed to chair. Unable to control RW with R UE but benefits from L UE support. Toileting- Clothing Manipulation and  Hygiene: Moderate assistance, Sit to/from stand Functional mobility during ADLs: Minimal assistance General ADL Comments: Pt unable to control RW with R UE this session but benefits from L UE support during ambulation. Reports slight dizziness on standing.  Cognition: Cognition Overall Cognitive Status: Within Functional Limits for tasks assessed Orientation Level: Oriented X4, Oriented to person, Oriented to place, Oriented to time, Oriented to situation Cognition Arousal/Alertness: Awake/alert Behavior During Therapy: [facility] for tasks assessed/performed Overall Cognitive Status: Within Functional Limits for tasks assessed Area of Impairment: Problem solving Problem Solving: Slow processing, Difficulty sequencing General Comments: Difficulty with higher level cognitive tasks this session.  Blood pressure 124/84, pulse 87, temperature 97.7 F (36.5 C), temperature source Oral, resp. rate 18, height  (1.6 m), weight 102.8 kg (226 lb 9.6 oz), last menstrual period 01/31/2017, SpO2 100 %. Physical Exam  Vitals reviewed. Constitutional: She is oriented to person, place, and time. She appears well-developed and well-nourished.  HENT:  Head: Normocephalic and atraumatic.  Eyes: Conjunctivae and EOM are normal.  Neck: Normal range of motion. Neck supple. No thyromegaly present.  Cardiovascular: Normal rate and regular rhythm.   Respiratory: Effort normal and breath sounds normal. No respiratory distress.  GI: Soft. Bowel sounds are normal. She exhibits no distension.  Musculoskeletal: She exhibits no edema or tenderness.  Neurological: She is alert and oriented to person, place, and time.  Follows commands.  Fair awareness of deficits. Motor: LUE/LLE: 5/5 proximal to distal RUE: Shoulder abduction, elbow flexion/extension, wrist extension 1+/5, hand grip 2/5 RLE: 2/5 proximal to distal Sensation diminished to light touch right side DTRs symmetric  Skin: Skin is warm and dry.    Psychiatric: She has a normal mood and affect. Her behavior is normal. Thought content  normal.    Lab Results Last 24 Hours  No results found for this or any previous visit (from the past 24 hour(s)).    Imaging Results (Last 48 hours)  Ct Angio Head W Or Wo Contrast  Addendum Date: 02/25/2017   ADDENDUM REPORT: 02/25/2017 14:30 ADDENDUM: Preliminary report of this exam was discussed by telephone with Dr. Johnn Hai on 02/25/2017 at 1343 hours. Electronically Signed   By: Odessa Fleming M.D.   On: 02/25/2017 14:30   Result Date: 02/25/2017 CLINICAL DATA:  41 year old female with RIGHT side weakness. Confusion. Sickle cell disease. EXAM: CT ANGIOGRAPHY HEAD AND NECK TECHNIQUE: Multidetector CT imaging of the head and neck was performed using the standard protocol during bolus administration of intravenous contrast. Multiplanar CT image reconstructions and MIPs were obtained to evaluate the vascular anatomy. Carotid stenosis measurements (when applicable) are obtained utilizing NASCET criteria, using the distal internal carotid diameter as the denominator. CONTRAST:  100 mL Isovue 370 COMPARISON:  Head CT without contrast 1259 hours today. FINDINGS: CTA NECK Skeleton: No acute osseous abnormality identified. Cervical and thoracic endplates are relatively preserved. Visualized paranasal sinuses and mastoids are stable and well pneumatized. Upper chest: Partially visible cardiomegaly. Negative visualized thoracic aorta. No superior mediastinal lymphadenopathy. Negative visible lung parenchyma except for atelectasis. Other neck: Thyroid remarkable for 18 mm hypodense nodule on the left. Negative larynx, pharynx, parapharyngeal spaces, retropharyngeal space (partially retropharyngeal right carotid), sublingual space, submandibular glands and parotid glands. No cervical lymphadenopathy. Aortic arch: 3 vessel arch configuration, no atherosclerosis. Enhancement of small venous collaterals about the right shoulder.  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 Right carotid system: Mildly tortuous brachiocephalic artery with no stenosis. Negative proximal right CCA. Partially retropharyngeal right carotid bifurcation. Negative cervical right ICA aside from mild tortuosity. Left carotid system: Negative. Vertebral arteries:No proximal right subclavian stenosis. Non dominant right vertebral artery. Right vertebral artery origin and cervical right vertebral arteries are within normal limits. No proximal left subclavian artery stenosis. Dominant left vertebral artery. No left vertebral origin stenosis. Tortuous left V1 segment. Mildly tortuous left V2 segment, no stenosis to the skullbase. CTA HEAD Posterior circulation: Distal left vertebral artery is dominant. No distal vertebral stenosis. Both PICA origins are patent. No basilar stenosis. Normal SCA origins. Normal left PCA origin. Fetal type right PCA. Left posterior communicating artery is present. Mild right P1 and P2 and moderate to severe left P1 and P2, irregularity and stenosis as seen on series 407, image 52. Preserved bilateral distal PCA flow. Anterior circulation: Both ICA siphons are patent without atherosclerosis or stenosis. Normal ophthalmic and posterior communicating artery origins. Patent carotid termini. However, there is moderate irregularity and stenosis at the right M1 origin (series 403, image 108). The right MCA M1 segment is patent but there is moderate to severe irregularity and stenosis also in the mid M1 (series 403, image 103 and see also series 407, image 52). Despite this the right MCA bifurcation is patent. No right M2 branch occlusion identified. Right MCA M2 and M3 branches are within normal limits. Left MCA origin and proximal M1 segment are normal. There is mild irregularity in the mid left M1 segment. The left MCA bifurcation is patent. There is moderate left M2 and  moderate to severe left M3 irregularity and stenosis as seen on series 406, image 32. ACA origins and anterior communicating arteries are within normal limits. Proximal A2 branches are within normal  limits but there is severe left ACA A2 segment stenosis near the callosomarginal artery, and moderate to severe bilateral distal ACA/pericallosal artery irregularity and stenosis (series 406, image 25). Venous sinuses: Patent. Anatomic variants: Dominant left vertebral artery. Fetal type right PCA origin. Review of the MIP images confirms the above findings IMPRESSION: 1. No atherosclerosis or stenosis in the neck, ICA siphons, or basilar artery. 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 Still, there is no emergent large vessel occlusion or target for endovascular intervention identified. 2. Moderate or severe stenoses of the: - Left PCA P1 and P2, - Right MCA origin and M1, - Left ACA A2 and bilateral distal pericallosal arteries, - Left MCA M2 and M3. 3. Left thyroid 18 mm nodule meets consensus criteria for nonemergent follow-up Thyroid Ultrasound characterization. Electronically Signed: By: Odessa Fleming M.D. On: 02/25/2017 13:57   Dg Chest 2 View  Result Date: 02/25/2017 CLINICAL DATA:  Right side numbness, facial droop, history of sickle cell disease EXAM: CHEST  2 VIEW COMPARISON:  05/07/2006 FINDINGS: Borderline cardiomegaly. Right IJ Port-A-Cath with tip in upper SVC is unchanged in position. No acute infiltrate or pulmonary edema. Mild left basilar atelectasis. Mild degenerative changes mid thoracic spine. IMPRESSION: No active cardiopulmonary disease.  Mild left basilar atelectasis. Electronically Signed   By: Natasha Mead M.D.   On: 02/25/2017 17:19   Ct Angio Neck W And/or Wo Contrast  Addendum Date: 02/25/2017   ADDENDUM REPORT: 02/25/2017 14:30 ADDENDUM: Preliminary report of this exam was discussed by telephone with Dr. Johnn Hai on 02/25/2017  at 1343 hours. Electronically Signed   By: Odessa Fleming M.D.   On: 02/25/2017 14:30   Result Date: 02/25/2017 CLINICAL DATA:  41 year old female with RIGHT side weakness. Confusion. Sickle cell disease. EXAM: CT ANGIOGRAPHY HEAD AND NECK TECHNIQUE: Multidetector CT imaging of the head and neck was performed using the standard protocol during bolus administration of intravenous contrast. Multiplanar CT image reconstructions and MIPs were obtained to evaluate the vascular anatomy. Carotid stenosis measurements (when applicable) are obtained utilizing NASCET criteria, using the distal internal carotid diameter as the denominator. CONTRAST:  100 mL Isovue 370 COMPARISON:  Head CT without contrast 1259 hours today. FINDINGS: CTA NECK Skeleton: No acute osseous abnormality identified. Cervical and thoracic endplates are relatively preserved. Visualized paranasal sinuses and mastoids are stable and well pneumatized. Upper chest: Partially visible cardiomegaly. Negative visualized thoracic aorta. No superior mediastinal lymphadenopathy. Negative visible lung parenchyma except for atelectasis. Other neck: Thyroid remarkable for 18 mm hypodense nodule on the left. Negative larynx, pharynx, parapharyngeal spaces, retropharyngeal space (partially retropharyngeal right carotid), sublingual space, submandibular glands and parotid glands. No cervical lymphadenopathy. Aortic arch: 3 vessel arch configuration, no atherosclerosis. Enhancement of small venous collaterals about the right shoulder. 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 Right carotid system: Mildly tortuous brachiocephalic artery with no stenosis. Negative proximal right CCA. Partially retropharyngeal right carotid bifurcation. Negative cervical right ICA aside from mild tortuosity. Left carotid system: Negative. Vertebral arteries:No proximal right subclavian stenosis. Non dominant right  vertebral artery. Right vertebral artery origin and cervical right vertebral arteries are within normal limits. No proximal left subclavian artery stenosis. Dominant left vertebral artery. No left vertebral origin stenosis. Tortuous left V1 segment. Mildly tortuous left V2 segment, no stenosis to the skullbase. CTA HEAD Posterior circulation: Distal left vertebral artery is dominant. No distal vertebral  stenosis. Both PICA origins are patent. No basilar stenosis. Normal SCA origins. Normal left PCA origin. Fetal type right PCA. Left posterior communicating artery is present. Mild right P1 and P2 and moderate to severe left P1 and P2, irregularity and stenosis as seen on series 407, image 52. Preserved bilateral distal PCA flow. Anterior circulation: Both ICA siphons are patent without atherosclerosis or stenosis. Normal ophthalmic and posterior communicating artery origins. Patent carotid termini. However, there is moderate irregularity and stenosis at the right M1 origin (series 403, image 108). The right MCA M1 segment is patent but there is moderate to severe irregularity and stenosis also in the mid M1 (series 403, image 103 and see also series 407, image 52). Despite this the right MCA bifurcation is patent. No right M2 branch occlusion identified. Right MCA M2 and M3 branches are within normal limits. Left MCA origin and proximal M1 segment are normal. There is mild irregularity in the mid left M1 segment. The left MCA bifurcation is patent. There is moderate left M2 and moderate to severe left M3 irregularity and stenosis as seen on series 406, image 32. ACA origins and anterior communicating arteries are within normal limits. Proximal A2 branches are within normal limits but there is severe left ACA A2 segment stenosis near the callosomarginal artery, and moderate to severe bilateral distal ACA/pericallosal artery irregularity and stenosis (series 406, image 25). Venous sinuses: Patent. Anatomic variants:  Dominant left vertebral artery. Fetal type right PCA origin. Review of the MIP images confirms the above findings IMPRESSION: 1. No atherosclerosis or stenosis in the neck, ICA siphons, or basilar artery. 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 Still, there is no emergent large vessel occlusion or target for endovascular intervention identified. 2. Moderate or severe stenoses of the: - Left PCA P1 and P2, - Right MCA origin and M1, - Left ACA A2 and bilateral distal pericallosal arteries, - Left MCA M2 and M3. 3. Left thyroid 18 mm nodule meets consensus criteria for nonemergent follow-up Thyroid Ultrasound characterization. Electronically Signed: By: Odessa Fleming M.D. On: 02/25/2017 13:57   Mr Maxine Glenn Head Wo Contrast  Result Date: 02/25/2017 CLINICAL DATA:  Sudden onset of right upper and lower extremity weakness and facial droop. Hypertensive crisis. Sickle cell disease. EXAM: MRI HEAD WITHOUT CONTRAST MRA HEAD WITHOUT CONTRAST TECHNIQUE: Multiplanar, multiecho pulse sequences of the brain and surrounding structures were obtained without intravenous contrast. Angiographic images of the head were obtained using MRA technique without contrast. COMPARISON:  CT head without contrast and CTA head and neck from the same day. FINDINGS: MRI HEAD FINDINGS Brain: Diffusion-weighted images demonstrate restricted diffusion within the 80 left lack infarct noted on CT. Areas of T2 shine through or noted in the corona radiata bilaterally. No additional acute infarcts are present. There are remote infarcts involving the right basal ganglia. Scattered subcortical T2 hyperintensities are highly advanced for age. Bilateral T2 hyperintensities are present within the corona radiata. A remote infarct is present in the genu of the left internal capsule. A remote infarct is noted along the left side of the body of the corpus callosum. Vascular: Flow is present in the major  intracranial arteries. Skull and upper cervical spine: The skullbase is within normal limits. The craniocervical junction is unremarkable. Midline sagittal structures are within normal limits. The upper cervical spine is normal. Sinuses/Orbits: The paranasal sinuses and mastoid air cells are clear. The globes and orbits are within normal limits MRA HEAD FINDINGS  The internal carotid arteries are within normal limits the high cervical segments through the ICA termini bilaterally. Mild narrowing is again seen at the origins of the right A1 and M1 segments. There is moderate narrowing of the distal right M1 segment. Segmental narrowing present throughout the MCA branch vessels bilaterally. The left vertebral artery is the dominant vessel. PICA origins are visualized and normal bilaterally. The basilar artery is normal. A moderate stenosis present proximal left posterior cerebral artery. The right posterior cerebral artery is of fetal type. There is cecum at attenuation distal PCA branch vessels bilaterally. IMPRESSION: 1. 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 2. Remote infarcts involving the basal ganglia bilaterally. 3. Scattered white matter changes bilaterally are mildly advanced for age. This likely also reflects the sequela of microvascular ischemia or vasculitis. 4. Remote infarcts involving the body of the corpus callosum. 5. Extensive medium and small vessel disease evident on the MRA as described. Electronically Signed   By: Marin Roberts M.D.   On: 02/25/2017 15:07   Mr Brain Wo Contrast  Result Date: 02/25/2017 CLINICAL DATA:  Sudden onset of right upper and lower extremity weakness and facial droop. Hypertensive crisis. Sickle cell disease. EXAM: MRI HEAD WITHOUT CONTRAST MRA HEAD WITHOUT CONTRAST TECHNIQUE: Multiplanar, multiecho pulse sequences of the brain and surrounding structures were obtained without intravenous contrast. Angiographic images  of the head were obtained using MRA technique without contrast. COMPARISON:  CT head without contrast and CTA head and neck from the same day. FINDINGS: MRI HEAD FINDINGS Brain: Diffusion-weighted images demonstrate restricted diffusion within the 80 left lack infarct noted on CT. Areas of T2 shine through or noted in the corona radiata bilaterally. No additional acute infarcts are present. There are remote infarcts involving the right basal ganglia. Scattered subcortical T2 hyperintensities are highly advanced for age. Bilateral T2 hyperintensities are present within the corona radiata. A remote infarct is present in the genu of the left internal capsule. A remote infarct is noted along the left side of the body of the corpus callosum. Vascular: Flow is present in the major intracranial arteries. Skull and upper cervical spine: The skullbase is within normal limits. The craniocervical junction is unremarkable. Midline sagittal structures are within normal limits. The upper cervical spine is normal. Sinuses/Orbits: The paranasal sinuses and mastoid air cells are clear. The globes and orbits are within normal limits MRA HEAD FINDINGS The internal carotid arteries are within normal limits the high cervical segments through the ICA termini bilaterally. Mild narrowing is again seen at the origins of the right A1 and M1 segments. There is moderate narrowing of the distal right M1 segment. Segmental narrowing present throughout the MCA branch vessels bilaterally. The left vertebral artery is the dominant vessel. PICA origins are visualized and normal bilaterally. The basilar artery is normal. A moderate stenosis present proximal left posterior cerebral artery. The right posterior cerebral artery is of fetal type. There is cecum at attenuation distal PCA branch vessels bilaterally. IMPRESSION: 1. 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 2. Remote infarcts involving the basal  ganglia bilaterally. 3. Scattered white matter changes bilaterally are mildly advanced for age. This likely also reflects the sequela of microvascular ischemia or vasculitis. 4. Remote infarcts involving the body of the corpus callosum. 5. Extensive medium and small vessel disease evident on the MRA as described. Electronically Signed   By: Marin Roberts M.D.   On: 02/25/2017 15:07   Ct Head Code Stroke  W/o Cm  Result Date: 02/25/2017 CLINICAL DATA:  Code stroke. 41 year old female last seen normal this morning. Left facial droop and left side numbness. Initial encounter. Sickle cell disease EXAM: CT HEAD WITHOUT CONTRAST TECHNIQUE: Contiguous axial images were obtained from the base of the skull through the vertex without intravenous contrast. COMPARISON:  Report of brain MRI 07/11/1999 (no images available). FINDINGS: Brain: Overall normal cerebral volume. Chronic appearing lacunar infarct right basal ganglia. Small age indeterminate hypodensity left thalamus (series 21, image 14). Focal chronic appearing lacunar infarct in the body of the corpus callosum (coronal image 34). Other small areas of cerebral white matter hypodensity. No midline shift, ventriculomegaly, mass effect, evidence of mass lesion, intracranial hemorrhage or evidence of cortically based acute infarction. Vascular: No suspicious intracranial vascular hyperdensity. Skull: Negative. Sinuses/Orbits: Clear. Other: Negative orbit and scalp soft tissues. ASPECTS [facility] Stroke Program Early CT Score) - Ganglionic level infarction (caudate, lentiform nuclei, internal capsule, insula, M1-M3 cortex): 7 - Supraganglionic infarction (M4-M6 cortex): 3 Total score (0-10 with 10 being normal): 10 IMPRESSION: 1. No acute cortically based infarct or acute intracranial hemorrhage. ASPECTS is 10. 2. Evidence of scattered small vessel ischemia, with chronic appearing involvement of the corpus callosum and right basal ganglia. Small age indeterminate  area in the left thalamus. 3. The above was relayed via text pager to Dr. Johnn Hai on 02/25/2017 at 13:09 . Electronically Signed   By: Odessa Fleming M.D.   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  Records reviewed and summated above. Stroke: Continue secondary stroke prophylaxis and Risk Factor Modification listed below:   Antiplatelet therapy:   Blood Pressure Management:  Continue current medication with prn's with permisive HTN per primary team Statin Agent:   Prediabetes management:   Tobacco abuse:   Right sided hemiparesis: fit for orthosis to prevent contractures (resting hand splint for day, wrist cock up splint at night, PRAFO, etc) Motor recovery: Fluoxetine   1. Does the need for close, 24 hr/day medical supervision in concert with the patient's rehab needs make it unreasonable for this patient to be served in a less intensive setting? Yes 2. Co-Morbidities requiring supervision/potential complications: Dysphagia (advance diet as tolerated), HTN (monitor and provide prns in accordance with increased physical exertion and pain), polycystic disease, sickle cell disease (consider Heme/Onc consult, monitor, ensure appropriate hydration, manage pain with oral meds), prediabetes (Monitor in accordance with exercise and adjust meds as necessary), headache (Biofeedback training with therapies to help reduce reliance on opiate pain medications, particularly IV toradol -especially given CVA and dilaudid, monitor pain control during therapies, and sedation at rest and titrate to maximum efficacy to ensure participation and gains in therapies), leukocytosis (cont to monitor for signs and symptoms of infection, further workup if indicated) 3. Due to bladder management, safety, skin/wound care, disease management, pain management and patient education, does the patient require 24 hr/day  rehab nursing? Yes 4. Does the patient require coordinated care of a physician, rehab nurse, PT (1-2 hrs/day, 5 days/week), OT (1-2 hrs/day, 5 days/week) and SLP (1-2 hrs/day, 5 days/week) to address physical and functional deficits in the context of the above medical diagnosis(es)? Yes Addressing deficits in the following areas: balance, endurance, locomotion, strength, transferring, bowel/bladder control, bathing, dressing, toileting, swallowing and psychosocial support 5. Can the patient actively participate in an intensive therapy program of at least 3 hrs of therapy per day at least 5 days per week?  Potentially 6. The potential for patient to make measurable gains while on inpatient rehab is excellent 7. Anticipated functional outcomes upon discharge from inpatient rehab are modified independent  with PT, modified independent and supervision with OT, modified independent with SLP. 8. Estimated rehab length of stay to reach the above functional goals is: 12-17 days. 9. Does the patient have adequate social supports and living environment to accommodate these discharge functional goals? Yes 10. Anticipated D/C setting: Home 11. Anticipated post D/C treatments: HH therapy and Home excercise program 12. Overall Rehab/Functional Prognosis: good  RECOMMENDATIONS: This patient's condition is appropriate for continued rehabilitative care in the following setting: CIR once medically stable and workup complete. Patient has agreed to participate in recommended program. Yes Note that insurance prior authorization may be required for reimbursement for recommended care.  Comment: Rehab Admissions Coordinator to follow up.  Maryla Morrow, MD, [place] Dom Charlton Amor., PA-C 02/27/2017    Revision History                   Routing History

## 2017-03-02 NOTE — Progress Notes (Signed)
Physical Therapy Treatment [name] MRN: [id number] DOB: 09/25/1976 Today's Date: 03/02/2017    History of Present Illness Pt is a 41 y.o. female who presented to the ED with R LE weakness, R UE weakness and numbness/tingling, and R facial numbness. 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 MRI revealed acute infarct in L thalamus. Pt with PMH significant for polycystic disease (ovaries), and sickle cell disease.    PT Comments    Pt progressing towards physical therapy goals. Reports she feels very tired today, and appeared to have difficulty maintaining an upright posture during gait training. Pt anticipates d/c to CIR this afternoon which she expresses excitement for, however continues to be tearful when discussing current functional level.  Emotional support provided. Will continue to follow and progress as able per POC.   Follow Up Recommendations  CIR     Equipment Recommendations  None recommended by PT    Recommendations for Other Services Rehab consult     Precautions / Restrictions Precautions Precautions: Fall Restrictions Weight Bearing Restrictions: No Other Position/Activity Restrictions: R-sided weakness    Mobility  Bed Mobility               General bed mobility comments: Pt received exiting bathroom.  Transfers Overall transfer level: Needs assistance Equipment used: Rolling walker (2 wheeled) (grip assist on RUE on RW ) Transfers: Sit to/from Stand Sit to Stand: Min guard         General transfer comment: Only observed stand>sit this session. VC's for hand placement on seated surface for safety, centering herself in the chair before initiating stand>sit, and general safety/awareness of RUE.   Ambulation/Gait Ambulation/Gait assistance: Min guard;Min assist;+2 safety/equipment Ambulation Distance (Feet): 100 Feet Assistive device: Rolling walker (2 wheeled) (Grip assist on RW  for RUE. ) Gait Pattern/deviations: Decreased stride length;Decreased dorsiflexion - right;Decreased weight shift to right;Trunk flexed;Step-to pattern Gait velocity: decreased Gait velocity interpretation: Below normal speed for age/gender General Gait Details: Use of RW and grip assist for RUE on RW. Pt able to maintain RUE placement on RW with grip assist and strap. Pt required several short standing rest breaks secondary to muscle fatigue however no seated rest until gait training was complete. Verbal cues for upright posture and appropriate proximity to RW during gait.    Stairs            Wheelchair Mobility    Modified Rankin (Stroke Patients Only) Modified Rankin (Stroke Patients Only) Pre-Morbid Rankin Score: No symptoms Modified Rankin: Moderately severe disability     Balance Overall balance assessment: Needs assistance Sitting-balance support: No upper extremity supported;Feet supported Sitting balance-Leahy Scale: Fair Sitting balance - Comments: sitting in recliner for HEP   Standing balance support: Bilateral upper extremity supported Standing balance-Leahy Scale: Poor Standing balance comment: Reliant on RW for stability                             Cognition Arousal/Alertness: Awake/alert Behavior During Therapy: WFL for tasks assessed/performed Overall Cognitive Status: Impaired/Different from baseline Area of Impairment: Problem solving;Memory                     Memory: Decreased short-term memory       Problem Solving: Slow processing;Difficulty sequencing        Exercises      General Comments General comments (skin integrity, edema, etc.): Pt stood at  sink to wash hands after bathroom use. Heavy lean on forearms on the counter for support as she washed. VC's for finding soap dispensor on the R side.       Pertinent Vitals/Pain Pain Assessment: No/denies pain Faces Pain Scale: No hurt    Home Living                       Prior Function            PT Goals (current goals can now be found in the care plan section) Acute Rehab PT Goals Patient Stated Goal: to get back to normal PT Goal Formulation: With patient Time For Goal Achievement: 03/12/17 Potential to Achieve Goals: Good Progress towards PT goals: Progressing toward goals    Frequency    Min 4X/week      PT Plan Current plan remains appropriate    Co-evaluation             End of Session Equipment Utilized During Treatment: Gait belt Activity Tolerance: Patient tolerated treatment well Patient left: in chair;with call bell/phone within reach;with family/visitor present;with nursing/sitter in room Nurse Communication: Mobility status PT Visit Diagnosis: Hemiplegia and hemiparesis;Muscle weakness (generalized) (M62.81);Unsteadiness on feet (R26.81) Hemiplegia - Right/Left: Right Hemiplegia - dominant/non-dominant: Non-dominant Hemiplegia - caused by: Cerebral infarction Pain - Right/Left: Right Pain - part of body: Hip     Time: 1610-9604 PT Time Calculation (min) (ACUTE ONLY): 16 min  Charges:  $Gait Training: 8-22 mins                    G Codes:       Sharon Chan, PT, DPT Acute Rehabilitation Services Pager: [phone]    Sharon Chan 03/02/2017, 3:36 PM

## 2017-03-02 NOTE — Progress Notes (Signed)
Pt arrived to room 4MW03 accompanied by mother. A&O x4. Denies pain at present. VSS. Oriented to room and rehab process.

## 2017-03-02 NOTE — Progress Notes (Signed)
Standley Brooking, RN Rehab Admission Coordinator Signed Physical Medicine and Rehabilitation  PMR Pre-admission Date of Service: 03/01/2017 1:49 PM  Related encounter: ED to Hosp-Admission (Current) from 02/25/2017 in [facility]       Hide copied text PMR Admission Coordinator Pre-Admission Assessment  Patient: Sharon Chan is an 41 y.o., female MRN: [id number] DOB: 08-Aug-1976 Height:  (160 cm) Weight: 102.8 kg (226 lb 9.6 oz)                                                                                                                                                  Insurance Information HMO:     PPO:      PCP:      IPA:      80/20: yes     OTHER: no HMO PRIMARY: medicare a and b      Policy#: [id number]      Subscriber: pt Benefits:  Phone #: passport one online     Name: 03/01/17 Eff. Date: 01/12/2002     Deduct: $1340      Out of Pocket Max: none      Life Max: none CIR: 100%      SNF: 20 full days Outpatient: 80%     Co-Pay: 20% Home Health: 100%      Co-Pay: none DME: 80%     Co-Pay: 20% Providers: pt choice  SECONDARY: Medicaid [place] Access      Policy#: [id number] I     Subscriber: pt  Medicaid Application Date:       Case Manager:  Disability Application Date:       Case Worker:   Emergency Contact Information        Contact Information    Name Relation Home Work Mobile   [place] Mother [phone]     McLaughlin,Robert Other [phone]    [name] [phone]       Current Medical History  Patient Admitting Diagnosis: acute nonhemorrhagic infarct of left thalamus  History of Present Illness:  Sharon Yott Richardsonis a 41 y.o. left handed female with questionable history of S, PCOS who was admitted on 02/25/17 with RLE weakness and numbness. Blood pressures elevated requiring IV labetalol. MRI/MRA brain done revealing acute nonhemorrhagic infarcts involving  the left thalamus, remote infarcts bilateral basal ganglia and body of corpus callosum and scattered white matter disease. CTA head/neck done revealing widespread moderate severe stenosis and 18 mm left thyroid nodule--non-emergent follow up recommended. HIV and RPR negative. 2 D echo with EF 60-65% with no wall abnormality. Dr. 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 She was treated for SS crises and no indication for transfusion. She has reported decrease in vision with waxing and waning  of vision therefore opthalmology consulted for input. Dr. Pearlean Brownie feels that stroke due small vessel disease v/so vasculopathy and patient on ASA for secondary stroke prevention.   Total: 8 NIHSS  Past Medical History      Past Medical History:  Diagnosis Date  . Polycystic disease, ovaries   . Sickle cell trait (HCC)     Family History  family history includes Diabetes in her father; Heart attack in her mother; Hypertension in her sister; Post-traumatic stress disorder in her father.  Prior Rehab/Hospitalizations:  Has the patient had major surgery during 100 days prior to admission? No  Current Medications   Current Facility-Administered Medications:  .   stroke: mapping our early stages of recovery book, , Does not apply, Once, Haydee Salter, MD .  acetaminophen (TYLENOL) tablet 1,000 mg, 1,000 mg, Oral, Q6H PRN **OR** acetaminophen (TYLENOL) solution 650 mg, 650 mg, Per Tube, Q4H PRN **OR** acetaminophen (TYLENOL) suppository 650 mg, 650 mg, Rectal, Q4H PRN, Layne Benton, NP .  aspirin suppository 300 mg, 300 mg, Rectal, Daily **OR** aspirin tablet 325 mg, 325 mg, Oral, Daily, Haydee Salter, MD, 325 mg at 03/01/17 0933 .  aspirin tablet 325 mg, 325 mg, Oral, Once, Haydee Salter, MD .  atorvastatin (LIPITOR) tablet 40 mg, 40 mg, Oral, q1800, David L Rinehuls, PA-C, 40  mg at 03/01/17 1714 .  heparin injection 5,000 Units, 5,000 Units, Subcutaneous, Q8H, Haydee Salter, MD, 5,000 Units at 03/02/17 0559 .  hydrALAZINE (APRESOLINE) injection 10 mg, 10 mg, Intravenous, Q8H PRN, Haydee Salter, MD .  ibuprofen (ADVIL,MOTRIN) tablet 600 mg, 600 mg, Oral, Q6H PRN, Altha Harm, MD .  ondansetron [facility]) injection 4 mg, 4 mg, Intravenous, Q6H PRN, Weston Settle, MD, 4 mg at 02/27/17 1557 .  oxyCODONE (Oxy IR/ROXICODONE) immediate release tablet 5 mg, 5 mg, Oral, Q4H PRN, Altha Harm, MD, 5 mg at 03/02/17 0559 .  senna-docusate (Senokot-S) tablet 1 tablet, 1 tablet, Oral, QHS PRN, Haydee Salter, MD .  vitamin B-12 (CYANOCOBALAMIN) tablet 100 mcg, 100 mcg, Oral, Daily, Haydee Salter, MD, 100 mcg at 03/01/17 0932  Patients Current Diet: Diet regular Room service appropriate? Yes; Fluid consistency: Thin Diet - low sodium heart healthy  Precautions / Restrictions Precautions Precautions: Fall Restrictions Weight Bearing Restrictions: No Other Position/Activity Restrictions: R-sided weakness   Has the patient had 2 or more falls or a fall with injury in the past year?No  Prior Activity Level Limited Community (1-2x/wk): independent  Journalist, newspaper / Equipment Home Assistive Devices/Equipment: None Home Equipment: Walker - 2 wheels  Prior Device Use: Indicate devices/aids used by the patient prior to current illness, exacerbation or injury? None of the above  Prior Functional Level Prior Function Level of Independence: Independent Comments: Drives  Self Care: Did the patient need help bathing, dressing, using the toilet or eating?  Independent  Indoor Mobility: Did the patient need assistance with walking from room to room (with or without device)? Independent  Stairs: Did the patient need assistance with internal or external stairs (with or without device)? Independent  Functional Cognition: Did the patient  need help planning regular tasks such as shopping or remembering to take medications? Independent  Current Functional Level Cognition  Overall Cognitive Status: Impaired/Different from baseline Orientation Level: Oriented X4 General Comments: Completed MOCA with pt. Pt scored 22/30 (with 26/30 representing normal score). Greatest difficulty with visuospatial and executive functioning as well as delayed recall. SLight difficulty with serial 7's  and pt reports that "numbers are harder to figrure out."    Extremity Assessment (includes Sensation/Coordination)  Upper Extremity Assessment: RUE deficits/detail RUE Deficits / Details: Numbness/tingling and decreased light touch throughout. Pt with 3/5 strength in R shoulder flexion, 3-/5 R elbow flexion/extension, and 3+/5 grasp strength. Dysmetria with finger to nose and unable to isolate R digits. RUE Sensation: decreased light touch, decreased proprioception RUE Coordination: decreased fine motor  Lower Extremity Assessment: RLE deficits/detail RLE Deficits / Details: 3/5 strength for hip flexion, knee flexion and extension.  2/5 strengh dorsiflexion/plantarflexion.  Decreased light touch and coordination with heel to shin slides. RLE Sensation: decreased light touch, decreased proprioception RLE Coordination: decreased gross motor    ADLs  Overall ADL's : Needs assistance/impaired Eating/Feeding: Minimal assistance, Sitting Grooming: Sitting, Moderate assistance, Oral care, Wash/dry face Upper Body Bathing: Moderate assistance, Sitting Lower Body Bathing: Sit to/from stand, Maximal assistance Upper Body Dressing : Moderate assistance, Sitting Lower Body Dressing: Maximal assistance, Sit to/from stand Toilet Transfer: Minimal assistance, RW, Ambulation Toilet Transfer Details (indicate cue type and reason): Difficulty controlling RW with R UE with decreased active movement this session as compared to eval. Provided R sided RW splint and  educated pt on use. She verbalizes understanding. Educated on need to perform skin checks when utilizing to prevent skin breakdown. Toileting- Clothing Manipulation and Hygiene: Moderate assistance, Sit to/from stand Functional mobility during ADLs: Minimal assistance General ADL Comments: Pt reporting worsening vision on R side this session and decreased functional use of R hand. Provided HEP for R UE including lap slides (pronated and with wrist neutral) as well as composite flexion/extension. Pt able to complete 5 repetitions of AAROM in shoulder flexion, horizontal abduction/adduction, elbow flexion, forearm supination/pronation, and wrist flexion/extension.    Mobility  Overal bed mobility: Needs Assistance Bed Mobility: Sit to Supine Supine to sit: Min assist Sit to supine: Mod assist General bed mobility comments: Pt required assist for RLE lift into bed and RUE management during bed mobility. Educated pt on hooking LLE under RLE to assist with elevation.     Transfers  Overall transfer level: Needs assistance Equipment used: Rolling walker (2 wheeled) (grip assist on RUE on RW ) Transfers: Sit to/from Stand, [employer] Transfers Sit to Stand: Min assist Stand pivot transfers: Min assist General transfer comment: Verbal cues for hand placement. Assiste required for RUE placement on RW. Verbal cues for placing RLE in same position as LLE to encourage weightshift and muscle activation in RLE during transfer. Min A for lift assist and steadying during transfer.     Ambulation / Gait / Stairs / Wheelchair Mobility  Ambulation/Gait Ambulation/Gait assistance: Min guard, Min assist, +2 physical assistance Ambulation Distance (Feet): 100 Feet Assistive device: Rolling walker (2 wheeled) (Grip assist on RW for RUE. ) Gait Pattern/deviations: Decreased stride length, Decreased dorsiflexion - right, Decreased weight shift to right, Trunk flexed, Step-to pattern General Gait Details: Use  of RW and grip assist for RUE on RW. Pt able to maintain RUE placement on RW with grip assist and strap. Pt required standing rest break ~50' secondary to muscle fatigue. Verbal cues for upright posture and appropriate proximity to RW during gait.  Gait velocity: decreased Gait velocity interpretation: Below normal speed for age/gender    Posture / Balance Balance Overall balance assessment: Needs assistance Sitting-balance support: No upper extremity supported, Feet supported Sitting balance-Leahy Scale: Fair Standing balance support: Bilateral upper extremity supported Standing balance-Leahy Scale: Poor Standing balance comment: Reliant on RW for  stability     Special needs/care consideration BiPAP/CPAP  N/a CPM  N/a Continuous Drip IV  N/a Dialysis  N/a Life Vest  N/a Oxygen  N/a Special Bed  N/a Trach Size  N/a Wound Vac (area)  N/a Skin intact                         Bowel mgmt: LBM 03/01/17 continent Bladder mgmt: continent Diabetic mgmt  N/a   Previous Home Environment Living Arrangements: Alone  Lives With: Alone Available Help at Discharge:  (Mom) Type of Home: Apartment Home Layout: One level Home Access: Level entry Bathroom Shower/Tub: Tub/shower unit, Engineer, building services: Standard Bathroom Accessibility: Yes How Accessible: Accessible via walker Home Care Services: No  Discharge Living Setting Plans for Discharge Living Setting: Patient's home, Alone Type of Home at Discharge: Apartment Discharge Home Layout: One level Discharge Home Access: Level entry Discharge Bathroom Shower/Tub: Tub/shower unit, Curtain Discharge Bathroom Toilet: Standard Discharge Bathroom Accessibility: Yes How Accessible: Accessible via walker Does the patient have any problems obtaining your medications?: No  Social/Family/Support Systems Contact Information: [name] Anticipated Caregiver: Mom, has R foot boot to be removed likely 4/20 Anticipated Caregiver's  Contact Information: see above Ability/Limitations of Caregiver: Mom has R foot boot after ortho surgery Caregiver Availability: 24/7 Discharge Plan Discussed with Primary Caregiver: Yes Is Caregiver In Agreement with Plan?: Yes Does Caregiver/Family have Issues with Lodging/Transportation while Pt is in Rehab?: No  Goals/Additional Needs Patient/Family Goal for Rehab: Mod I PT, Mod I to supervision OT, Mod I SLP Expected length of stay: ELOS 10- 12 days Pt/Family Agrees to Admission and willing to participate: Yes Program Orientation Provided & Reviewed with Pt/Caregiver Including Roles  & Responsibilities: Yes Additional Information Needs: Pt needs new PCP, does not want Dr. Billee Cashing  She [facility], but needs PCP  Decrease burden of Care through IP rehab admission: n/a  Possible need for SNF placement upon discharge: not anticipated  Patient Condition: This patient's medical and functional status has changed since the consult dated: 02/27/2017 in which the Rehabilitation Physician determined and documented that the patient's condition is appropriate for intensive rehabilitative care in an inpatient rehabilitation facility. See "History of Present Illness" (above) for medical update. Functional changes are: min assist. Patient's medical and functional status update has been discussed with the Rehabilitation physician and patient remains appropriate for inpatient rehabilitation. Will admit to inpatient rehab today.  Preadmission Screen Completed By:  Clois Dupes, 03/02/2017 10:11 AM ______________________________________________________________________   Discussed status with Dr. Wynn Banker on 03/02/2017 at  1009 and received telephone approval for admission today.  Admission Coordinator:  Clois Dupes, time 1009 Date 03/02/2017 .       Cosigned by: Ankit Karis Juba, MD at 03/02/2017 10:18 AM  Revision History

## 2017-03-02 NOTE — Progress Notes (Signed)
I contacted Dr. Ashley Royalty and we can plan admit to inpt rehab today. I will make the arrangements. [phone]

## 2017-03-02 NOTE — H&P (Signed)
Physical Medicine and Rehabilitation Admission H&P    Chief Complaint  Patient presents with  . Right sided weakness and visual deficits.    HPI: Sharon Chan is a 41 y.o. left handed female with reported history of SSD, PCOS who was admitted on 02/25/17 with RLE weakness and numbness. History taken from chart review and patient. Blood pressures elevated requiring IV labetalol. 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  CTA head/neck done revealing widespread moderate severe stenosis , Bilateral PCA, left MCA, left ACA and 18 mm left thyroid nodule--non-emergent follow up recommended.  HIV and RPR negative. 2 D echo with EF 60-65% with no wall abnormality.  Dr. 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 She was treated for SS crises and no indication for transfusion.   Patient without documentation of SSD and electrophoresis consistent with sickle cell trait and question of JRA.  Coagulopathy panel ordered today for work up. Anemia panel negative. She reported decrease in vision with waxing and waning of vision therefore opthalmology consulted for input.  Exam by Dr. Randon Goldsmith was negative for acute findings and patient to follow up on outpatient basis.  Dr. Pearlean Brownie feels that stroke due small vessel disease v/so vasculopathy and patient on ASA for secondary stroke prevention.   Patient feels like her vision problem has been improving. Still notes numbness on the right side of her face and right side of her body.  Review of Systems  HENT: Negative for hearing loss and tinnitus.   Eyes: Positive for pain (both). Negative for blurred vision.  Respiratory: Negative for shortness of breath.   Cardiovascular: Negative for chest pain and  palpitations.  Gastrointestinal: Positive for heartburn. Negative for blood in stool and constipation.  Genitourinary: Negative for dysuria and urgency.  Musculoskeletal: Positive for joint pain (hip discomfort). Negative for back pain and myalgias.  Skin: Negative for itching and rash.  Neurological: Positive for dizziness and sensory change (face down). Negative for weakness and headaches.  Psychiatric/Behavioral: The patient is nervous/anxious and has insomnia.   All other systems reviewed and are negative.  Past Medical History:  Diagnosis Date  . Polycystic disease, ovaries   . Sickle cell trait [facility])    Past Surgical History:  Procedure Laterality Date  . LAPAROSCOPIC CHOLECYSTECTOMY      Family History  Problem Relation Age of Onset  . Heart attack Mother   . Post-traumatic stress disorder Father     committed suicide  . Diabetes Father   . Hypertension Sister     Social History:  Lives alone. Mother lives nearby.  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 Independent PTA. She reports that she has never smoked. She has never used smokeless tobacco. She reports that she does not drink alcohol. Her drug history is not on file.   Allergies: No Known Allergies   Medications Prior to Admission  Medication Sig Dispense Refill  . aspirin 325 MG tablet Take 1 tablet (325 mg total) by mouth daily. 30 tablet 1  . atorvastatin (LIPITOR) 40 MG tablet Take 1 tablet (40 mg total) by mouth daily at 6 PM. 30 tablet 1  . Cyanocobalamin (VITAMIN B-12 PO) Take 2 tablets by mouth daily.    Marland Kitchen MAGNESIUM PO Take 1 tablet by  mouth at bedtime.     Home:     Functional History:    Functional Status:  Mobility:          ADL:    Cognition:      Last menstrual period 01/31/2017. Physical Exam  Nursing note and vitals reviewed. Constitutional: She is oriented to person, place, and time. She appears well-developed and well-nourished. No distress.  HENT:  Head:  Normocephalic and atraumatic.  Mouth/Throat: Oropharynx is clear and moist.  Eyes: Conjunctivae are normal. Pupils are equal, round, and reactive to light.  Neck: Normal range of motion. Neck supple.  Cardiovascular: Normal rate and regular rhythm.   Respiratory: Effort normal and breath sounds normal. Stridor present. No respiratory distress.  GI: Soft. Bowel sounds are normal. She exhibits no distension.  Musculoskeletal: She exhibits no edema or tenderness.  Neurological: She is alert and oriented to person, place, and time.  Reports decreased visual acuity worse in right. Speech clear with minimal word finding deficits. She is able to follow simple one and two step commands. Right sided weakness with sensory deficits.   Skin: Skin is warm and dry. She is not diaphoretic.  Psychiatric: She has a normal mood and affect. Her behavior is normal. Judgment and thought content normal.  Motor strength is 5/5 in the left deltoid, biceps, triceps, grip, hip flexor, knee extensor, ankle dorsiflexor. 4/5 in the right deltoid, bicep, tricep, grip, hip flexor, knee extensor, ankle dysphagia. There is mild dysmetria, finger-nose-finger testing on the right side, decreased finger to thumb opposition on the right side as well. Able to identify correctly light touch on the right side as well as proprioception. Left-sided sensation is normal. Right side does feel different than left side  Results for orders placed or performed during the hospital encounter of 02/25/17 (from the past 48 hour(s))  CBC with Differential/Platelet     Status: Abnormal   Collection Time: 03/01/17  5:12 AM  Result Value Ref Range   WBC 8.7 4.0 - 10.5 K/uL   RBC 4.72 3.87 - 5.11 MIL/uL   Hemoglobin 11.8 (L) 12.0 - 15.0 g/dL   HCT 45.4 (L) 09.8 - 11.9 %   MCV 75.6 (L) 78.0 - 100.0 fL   MCH 25.0 (L) 26.0 - 34.0 pg   MCHC 33.1 30.0 - 36.0 g/dL   RDW 14.7 82.9 - 56.2 %   Platelets 285 150 - 400 K/uL   Neutrophils Relative % 44 %     Neutro Abs 3.8 1.7 - 7.7 K/uL   Lymphocytes Relative 45 %   Lymphs Abs 3.9 0.7 - 4.0 K/uL   Monocytes Relative 7 %   Monocytes Absolute 0.6 0.1 - 1.0 K/uL   Eosinophils Relative 4 %   Eosinophils Absolute 0.4 0.0 - 0.7 K/uL   Basophils Relative 0 %   Basophils Absolute 0.0 0.0 - 0.1 K/uL  Reticulocytes     Status: None   Collection Time: 03/01/17  5:12 AM  Result Value Ref Range   Retic Ct Pct 1.1 0.4 - 3.1 %   RBC. 4.72 3.87 - 5.11 MIL/uL   Retic Count, Manual 51.9 19.0 - 186.0 K/uL  Iron and TIBC     Status: None   Collection Time: 03/01/17  9:05 PM  Result Value Ref Range   Iron 59 28 - 170 ug/dL   TIBC 130 865 - 784 ug/dL   Saturation Ratios 20 10.4 - 31.8 %   UIBC 232 ug/dL  Ferritin  Status: None   Collection Time: 03/01/17  9:05 PM  Result Value Ref Range   Ferritin 47 11 - 307 ng/mL  Antithrombin III     Status: None   Collection Time: 03/02/17 12:24 PM  Result Value Ref Range   AntiThromb III Func 97 75 - 120 %   No results found.     Medical Problem List and Plan: 1.  Gait abnormality, limitations with self-care secondary to left thalamus infarct. 2.  DVT Prophylaxis/Anticoagulation: Pharmaceutical: Lovenox 3. Pain Management: Will change oxycodone to ultram prn for joint pain/aches.  4. Mood: team to provide ego support to help with adjustment reaction. LCSW to follow for evaluation and support.  5. Neuropsych: This patient is capable of making decisions on her own behalf. 6. Skin/Wound Care: routine pressure relief measures. 7. Fluids/Electrolytes/Nutrition: Monitor I/O. Check lytes in am. Offer supplements between meals as reports poor intake.  8. HTN: Permissive HTN with gradual normalization in next 1-2 days. Will add medication as indicated.  9. Dyslipidemia: On Lipitor.  10.Sickle Cell trait: Iron studies WNL. She does not have primary MD--will need PMD at discharge per Dr. Ashley Royalty.  Hematology and Rheumatology consults recommended for work up  also.  11. Thyroid nodule: Follow up with primary MD for workup. TSH WNL   Post Admission Physician Evaluation: 1. Left thalamic infarct causing right hemiparesis and right hemisensory deficits 2. Functional deficits secondary  to left thalamus infarct. 3. Patient is admitted to receive collaborative, interdisciplinary care between the physiatrist, rehab nursing staff, and therapy team. 4. Patient's level of medical complexity and substantial therapy needs in context of that medical necessity cannot be provided at a lesser intensity of care such as a SNF. 5. Patient has experienced substantial functional loss from his/her baseline which was documented above under the "Functional History" and "Functional Status" headings.  Judging by the patient's diagnosis, physical exam, and functional history, the patient has potential for functional progress which will result in measurable gains while on inpatient rehab.  These gains will be of substantial and practical use upon discharge  in facilitating mobility and self-care at the household level. 6. Physiatrist will provide 24 hour management of medical needs as well as oversight of the therapy plan/treatment and provide guidance as appropriate regarding the interaction of the two. 7. The Preadmission Screening has been reviewed and patient status is unchanged unless otherwise stated above. 8. 24 hour rehab nursing will assist with bladder management, safety, disease management and patient education  and help integrate therapy concepts, techniques,education, etc. 9. PT will assess and treat for/with: Lower extremity strength, range of motion, stamina, balance, functional mobility, safety, adaptive techniques and equipment,  coping skills, pain control, stroke education.   Goals are: Mod I. 10. OT will assess and treat for/with: ADL's, functional mobility, safety, upper extremity strength, adaptive techniques and equipment, ego support, and community  reintegration.   Goals are: Mod I. Therapy may proceed with showering this patient. 11. Case Management and Social Worker will assess and treat for psychological issues and discharge planning. 12. Team conference will be held weekly to assess progress toward goals and to determine barriers to discharge. 13. Patient will receive at least 3 hours of therapy per day at least 5 days per week. 14. ELOS: 8-12 days.       15. Prognosis:  good  Maryla Morrow, MD, [place] Dom Erick Colace, MD 03/02/2017

## 2017-03-02 NOTE — Discharge Summary (Signed)
Sharon Chan MRN: [id number] DOB/AGE: Apr 08, 1976 41 y.o.  Admit date: 02/25/2017 Discharge date: 03/02/2017  Primary Care Physician:  No primary care provider on file.   Discharge Diagnoses:   Patient Active Problem List   Diagnosis Date Noted  . Sickle cell trait ([place])   . Hyperlipidemia   . Thyroid nodule   . Changes in vision   . Acute ischemic stroke ([place])   . Dysphagia, post-stroke   . Benign essential HTN   . Polycystic kidney disease   . Prediabetes   . Nonintractable headache   . Leukocytosis   . Stroke (cerebrum) ([place]) 02/25/2017  . Stroke ([place]) 02/25/2017    DISCHARGE MEDICATION: Allergies as of 03/02/2017   No Known Allergies     Medication List    TAKE these medications   aspirin 325 MG tablet Take 1 tablet (325 mg total) by mouth daily.   atorvastatin 40 MG tablet Commonly known as:  LIPITOR Take 1 tablet (40 mg total) by mouth daily at 6 PM.   MAGNESIUM PO Take 1 tablet by mouth at bedtime.   VITAMIN B-12 PO Take 2 tablets by mouth daily.         Consults:    SIGNIFICANT DIAGNOSTIC STUDIES:  Ct Angio Head W Or Wo Contrast  Addendum Date: 02/25/2017   ADDENDUM REPORT: 02/25/2017 14:30 ADDENDUM: Preliminary report of this exam was discussed by telephone with Dr. Alessandra Bevels on 02/25/2017 at 1343 hours. Electronically Signed   By: Genevie Ann M.D.   On: 02/25/2017 14:30   Result Date: 02/25/2017 CLINICAL DATA:  41 year old female with RIGHT side weakness. Confusion. Sickle cell disease. EXAM: CT ANGIOGRAPHY HEAD AND NECK TECHNIQUE: Multidetector CT imaging of the head and neck was performed using the standard protocol during bolus administration of intravenous contrast. Multiplanar CT image reconstructions and MIPs were obtained to evaluate the vascular anatomy. Carotid stenosis measurements (when applicable) are obtained utilizing NASCET criteria, using the distal internal carotid diameter as the denominator. CONTRAST:  100 mL Isovue 370  COMPARISON:  Head CT without contrast 1259 hours today. FINDINGS: CTA NECK Skeleton: No acute osseous abnormality identified. Cervical and thoracic endplates are relatively preserved. Visualized paranasal sinuses and mastoids are stable and well pneumatized. Upper chest: Partially visible cardiomegaly. Negative visualized thoracic aorta. No superior mediastinal lymphadenopathy. Negative visible lung parenchyma except for atelectasis. Other neck: Thyroid remarkable for 18 mm hypodense nodule on the left. Negative larynx, pharynx, parapharyngeal spaces, retropharyngeal space (partially retropharyngeal right carotid), sublingual space, submandibular glands and parotid glands. No cervical lymphadenopathy. Aortic arch: 3 vessel arch configuration, no atherosclerosis. Enhancement of small venous collaterals about the right shoulder. 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 Right carotid system: Mildly tortuous brachiocephalic artery with no stenosis. Negative proximal right CCA. Partially retropharyngeal right carotid bifurcation. Negative cervical right ICA aside from mild tortuosity. Left carotid system: Negative. Vertebral arteries:No proximal right subclavian stenosis. Non dominant right vertebral artery. Right vertebral artery origin and cervical right vertebral arteries are within normal limits. No proximal left subclavian artery stenosis. Dominant left vertebral artery. No left vertebral origin stenosis. Tortuous left V1 segment. Mildly tortuous left V2 segment, no stenosis to the skullbase. CTA HEAD Posterior circulation: Distal left vertebral artery is dominant. No distal vertebral stenosis. Both PICA origins are patent. No basilar stenosis. Normal SCA origins. Normal left PCA origin. Fetal type right PCA. Left posterior communicating artery is present. Mild right P1  and P2 and moderate to severe left P1 and P2, irregularity and  stenosis as seen on series 407, image 52. Preserved bilateral distal PCA flow. Anterior circulation: Both ICA siphons are patent without atherosclerosis or stenosis. Normal ophthalmic and posterior communicating artery origins. Patent carotid termini. However, there is moderate irregularity and stenosis at the right M1 origin (series 403, image 108). The right MCA M1 segment is patent but there is moderate to severe irregularity and stenosis also in the mid M1 (series 403, image 103 and see also series 407, image 52). Despite this the right MCA bifurcation is patent. No right M2 branch occlusion identified. Right MCA M2 and M3 branches are within normal limits. Left MCA origin and proximal M1 segment are normal. There is mild irregularity in the mid left M1 segment. The left MCA bifurcation is patent. There is moderate left M2 and moderate to severe left M3 irregularity and stenosis as seen on series 406, image 32. ACA origins and anterior communicating arteries are within normal limits. Proximal A2 branches are within normal limits but there is severe left ACA A2 segment stenosis near the callosomarginal artery, and moderate to severe bilateral distal ACA/pericallosal artery irregularity and stenosis (series 406, image 25). Venous sinuses: Patent. Anatomic variants: Dominant left vertebral artery. Fetal type right PCA origin. Review of the MIP images confirms the above findings IMPRESSION: 1. No atherosclerosis or stenosis in the neck, ICA siphons, or basilar artery. 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 Still, there is no emergent large vessel occlusion or target for endovascular intervention identified. 2. Moderate or severe stenoses of the: - Left PCA P1 and P2, - Right MCA origin and M1, - Left ACA A2 and bilateral distal pericallosal arteries, - Left MCA M2 and M3. 3. Left thyroid 18 mm nodule meets consensus criteria for nonemergent follow-up Thyroid  Ultrasound characterization. Electronically Signed: By: Genevie Ann M.D. On: 02/25/2017 13:57   Dg Chest 2 View  Result Date: 02/25/2017 CLINICAL DATA:  Right side numbness, facial droop, history of sickle cell disease EXAM: CHEST  2 VIEW COMPARISON:  05/07/2006 FINDINGS: Borderline cardiomegaly. Right IJ Port-A-Cath with tip in upper SVC is unchanged in position. No acute infiltrate or pulmonary edema. Mild left basilar atelectasis. Mild degenerative changes mid thoracic spine. IMPRESSION: No active cardiopulmonary disease.  Mild left basilar atelectasis. Electronically Signed   By: Lahoma Crocker M.D.   On: 02/25/2017 17:19   Ct Angio Neck W And/or Wo Contrast  Addendum Date: 02/25/2017   ADDENDUM REPORT: 02/25/2017 14:30 ADDENDUM: Preliminary report of this exam was discussed by telephone with Dr. Alessandra Bevels on 02/25/2017 at 1343 hours. Electronically Signed   By: Genevie Ann M.D.   On: 02/25/2017 14:30   Result Date: 02/25/2017 CLINICAL DATA:  41 year old female with RIGHT side weakness. Confusion. Sickle cell disease. EXAM: CT ANGIOGRAPHY HEAD AND NECK TECHNIQUE: Multidetector CT imaging of the head and neck was performed using the standard protocol during bolus administration of intravenous contrast. Multiplanar CT image reconstructions and MIPs were obtained to evaluate the vascular anatomy. Carotid stenosis measurements (when applicable) are obtained utilizing NASCET criteria, using the distal internal carotid diameter as the denominator. CONTRAST:  100 mL Isovue 370 COMPARISON:  Head CT without contrast 1259 hours today. FINDINGS: CTA NECK Skeleton: No acute osseous abnormality identified. Cervical and thoracic endplates are relatively preserved. Visualized paranasal sinuses and mastoids are stable and well pneumatized. Upper chest: Partially visible cardiomegaly. Negative visualized thoracic  aorta. No superior mediastinal lymphadenopathy. Negative visible lung parenchyma except for atelectasis. Other neck:  Thyroid remarkable for 18 mm hypodense nodule on the left. Negative larynx, pharynx, parapharyngeal spaces, retropharyngeal space (partially retropharyngeal right carotid), sublingual space, submandibular glands and parotid glands. No cervical lymphadenopathy. Aortic arch: 3 vessel arch configuration, no atherosclerosis. Enhancement of small venous collaterals about the right shoulder. 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 Right carotid system: Mildly tortuous brachiocephalic artery with no stenosis. Negative proximal right CCA. Partially retropharyngeal right carotid bifurcation. Negative cervical right ICA aside from mild tortuosity. Left carotid system: Negative. Vertebral arteries:No proximal right subclavian stenosis. Non dominant right vertebral artery. Right vertebral artery origin and cervical right vertebral arteries are within normal limits. No proximal left subclavian artery stenosis. Dominant left vertebral artery. No left vertebral origin stenosis. Tortuous left V1 segment. Mildly tortuous left V2 segment, no stenosis to the skullbase. CTA HEAD Posterior circulation: Distal left vertebral artery is dominant. No distal vertebral stenosis. Both PICA origins are patent. No basilar stenosis. Normal SCA origins. Normal left PCA origin. Fetal type right PCA. Left posterior communicating artery is present. Mild right P1 and P2 and moderate to severe left P1 and P2, irregularity and stenosis as seen on series 407, image 52. Preserved bilateral distal PCA flow. Anterior circulation: Both ICA siphons are patent without atherosclerosis or stenosis. Normal ophthalmic and posterior communicating artery origins. Patent carotid termini. However, there is moderate irregularity and stenosis at the right M1 origin (series 403, image 108). The right MCA M1 segment is patent but there is moderate to severe irregularity and stenosis also in  the mid M1 (series 403, image 103 and see also series 407, image 52). Despite this the right MCA bifurcation is patent. No right M2 branch occlusion identified. Right MCA M2 and M3 branches are within normal limits. Left MCA origin and proximal M1 segment are normal. There is mild irregularity in the mid left M1 segment. The left MCA bifurcation is patent. There is moderate left M2 and moderate to severe left M3 irregularity and stenosis as seen on series 406, image 32. ACA origins and anterior communicating arteries are within normal limits. Proximal A2 branches are within normal limits but there is severe left ACA A2 segment stenosis near the callosomarginal artery, and moderate to severe bilateral distal ACA/pericallosal artery irregularity and stenosis (series 406, image 25). Venous sinuses: Patent. Anatomic variants: Dominant left vertebral artery. Fetal type right PCA origin. Review of the MIP images confirms the above findings IMPRESSION: 1. No atherosclerosis or stenosis in the neck, ICA siphons, or basilar artery. 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 Still, there is no emergent large vessel occlusion or target for endovascular intervention identified. 2. Moderate or severe stenoses of the: - Left PCA P1 and P2, - Right MCA origin and M1, - Left ACA A2 and bilateral distal pericallosal arteries, - Left MCA M2 and M3. 3. Left thyroid 18 mm nodule meets consensus criteria for nonemergent follow-up Thyroid Ultrasound characterization. Electronically Signed: By: Genevie Ann M.D. On: 02/25/2017 13:57   Mr Jodene Nam Head Wo Contrast  Result Date: 02/25/2017 CLINICAL DATA:  Sudden onset of right upper and lower extremity weakness and facial droop. Hypertensive crisis. Sickle cell disease. EXAM: MRI HEAD WITHOUT CONTRAST MRA HEAD WITHOUT CONTRAST TECHNIQUE: Multiplanar, multiecho pulse sequences of the brain and surrounding structures were obtained without  intravenous contrast. Angiographic images of the head were obtained using MRA technique without contrast. COMPARISON:  CT head without contrast and CTA head and neck from the same day. FINDINGS: MRI HEAD FINDINGS Brain: Diffusion-weighted images demonstrate restricted diffusion within the 80 left lack infarct noted on CT. Areas of T2 shine through or noted in the corona radiata bilaterally. No additional acute infarcts are present. There are remote infarcts involving the right basal ganglia. Scattered subcortical T2 hyperintensities are highly advanced for age. Bilateral T2 hyperintensities are present within the corona radiata. A remote infarct is present in the genu of the left internal capsule. A remote infarct is noted along the left side of the body of the corpus callosum. Vascular: Flow is present in the major intracranial arteries. Skull and upper cervical spine: The skullbase is within normal limits. The craniocervical junction is unremarkable. Midline sagittal structures are within normal limits. The upper cervical spine is normal. Sinuses/Orbits: The paranasal sinuses and mastoid air cells are clear. The globes and orbits are within normal limits MRA HEAD FINDINGS The internal carotid arteries are within normal limits the high cervical segments through the ICA termini bilaterally. Mild narrowing is again seen at the origins of the right A1 and M1 segments. There is moderate narrowing of the distal right M1 segment. Segmental narrowing present throughout the MCA branch vessels bilaterally. The left vertebral artery is the dominant vessel. PICA origins are visualized and normal bilaterally. The basilar artery is normal. A moderate stenosis present proximal left posterior cerebral artery. The right posterior cerebral artery is of fetal type. There is cecum at attenuation distal PCA branch vessels bilaterally. IMPRESSION: 1. 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 2.  Remote infarcts involving the basal ganglia bilaterally. 3. Scattered white matter changes bilaterally are mildly advanced for age. This likely also reflects the sequela of microvascular ischemia or vasculitis. 4. Remote infarcts involving the body of the corpus callosum. 5. Extensive medium and small vessel disease evident on the MRA as described. Electronically Signed   By: San Morelle M.D.   On: 02/25/2017 15:07   Mr Brain Wo Contrast  Result Date: 02/25/2017 CLINICAL DATA:  Sudden onset of right upper and lower extremity weakness and facial droop. Hypertensive crisis. Sickle cell disease. EXAM: MRI HEAD WITHOUT CONTRAST MRA HEAD WITHOUT CONTRAST TECHNIQUE: Multiplanar, multiecho pulse sequences of the brain and surrounding structures were obtained without intravenous contrast. Angiographic images of the head were obtained using MRA technique without contrast. COMPARISON:  CT head without contrast and CTA head and neck from the same day. FINDINGS: MRI HEAD FINDINGS Brain: Diffusion-weighted images demonstrate restricted diffusion within the 80 left lack infarct noted on CT. Areas of T2 shine through or noted in the corona radiata bilaterally. No additional acute infarcts are present. There are remote infarcts involving the right basal ganglia. Scattered subcortical T2 hyperintensities are highly advanced for age. Bilateral T2 hyperintensities are present within the corona radiata. A remote infarct is present in the genu of the left internal capsule. A remote infarct is noted along the left side of the body of the corpus callosum. Vascular: Flow is present in the major intracranial arteries. Skull and upper cervical spine: The skullbase is within normal limits. The craniocervical junction is unremarkable. Midline sagittal structures are within normal limits. The upper cervical spine is normal. Sinuses/Orbits: The paranasal sinuses and mastoid air cells are clear. The globes and orbits are within  normal limits MRA HEAD FINDINGS The internal carotid arteries  are within normal limits the high cervical segments through the ICA termini bilaterally. Mild narrowing is again seen at the origins of the right A1 and M1 segments. There is moderate narrowing of the distal right M1 segment. Segmental narrowing present throughout the MCA branch vessels bilaterally. The left vertebral artery is the dominant vessel. PICA origins are visualized and normal bilaterally. The basilar artery is normal. A moderate stenosis present proximal left posterior cerebral artery. The right posterior cerebral artery is of fetal type. There is cecum at attenuation distal PCA branch vessels bilaterally. IMPRESSION: 1. 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 2. Remote infarcts involving the basal ganglia bilaterally. 3. Scattered white matter changes bilaterally are mildly advanced for age. This likely also reflects the sequela of microvascular ischemia or vasculitis. 4. Remote infarcts involving the body of the corpus callosum. 5. Extensive medium and small vessel disease evident on the MRA as described. Electronically Signed   By: San Morelle M.D.   On: 02/25/2017 15:07   Ct Head Code Stroke W/o Cm  Result Date: 02/25/2017 CLINICAL DATA:  Code stroke. 41 year old female last seen normal this morning. Left facial droop and left side numbness. Initial encounter. Sickle cell disease EXAM: CT HEAD WITHOUT CONTRAST TECHNIQUE: Contiguous axial images were obtained from the base of the skull through the vertex without intravenous contrast. COMPARISON:  Report of brain MRI 07/11/1999 (no images available). FINDINGS: Brain: Overall normal cerebral volume. Chronic appearing lacunar infarct right basal ganglia. Small age indeterminate hypodensity left thalamus (series 21, image 14). Focal chronic appearing lacunar infarct in the body of the corpus callosum (coronal image 34). Other small areas of  cerebral white matter hypodensity. No midline shift, ventriculomegaly, mass effect, evidence of mass lesion, intracranial hemorrhage or evidence of cortically based acute infarction. Vascular: No suspicious intracranial vascular hyperdensity. Skull: Negative. Sinuses/Orbits: Clear. Other: Negative orbit and scalp soft tissues. ASPECTS [facility] Stroke Program Early CT Score) - Ganglionic level infarction (caudate, lentiform nuclei, internal capsule, insula, M1-M3 cortex): 7 - Supraganglionic infarction (M4-M6 cortex): 3 Total score (0-10 with 10 being normal): 10 IMPRESSION: 1. No acute cortically based infarct or acute intracranial hemorrhage. ASPECTS is 10. 2. Evidence of scattered small vessel ischemia, with chronic appearing involvement of the corpus callosum and right basal ganglia. Small age indeterminate area in the left thalamus. 3. The above was relayed via text pager to Dr. Alessandra Bevels on 02/25/2017 at 13:09 . Electronically Signed   By: Genevie Ann M.D.   On: 02/25/2017 13:09     ECHO:   Study Conclusions  - Left ventricle: The cavity size was normal. Systolic function was   normal. The estimated ejection fraction was in the range of 60%   to 65%. Wall motion was normal; there were no regional wall   motion abnormalities. The study is not technically sufficient to   allow evaluation of LV diastolic function.    No results found for this or any previous visit (from the past 240 hour(s)).  BRIEF ADMITTING H & P:  Sharon Chan is a 41 y.o. female  41 year old female past medical history of PCO S and sickle cell disease disease presents emergency room with chief complaint of right upper and lower extremity weakness. Patient states that at 8 a.m. this morning when she woke up she was perfectly normal. At around 11:00 she had onset of right upper extremity followed by right lower extremity weakness when taking trash. Her mother activated EMS and she was  brought to the emergency room for  evaluation.  Patient has no history of stroke. Denies any head trauma.  ED course: CT scan was indeterminate initially. Stroke alert call. MRI MRA ordered. Showed left thalamic stroke. Since patient was improving decision was made that patient needed to the hospital service with neurology following.   Hospital Course:  Present on Admission: . Stroke (cerebrum) ([place]) . Stroke ([place]) . Sickle cell trait ([place])  This is a patient who was admitted with an acute nonhemorrhagic CVA with right-sided hemiparesis. This patient was reported to have sickle cell crisis however her peripheral smear indices were not consistent with sickle cell disease. Further evaluation by hemoglobin electrophoresis prove that the patient had sickle cell trait rather than sickle cell disease. The patient however had all findings that this can consistent with possible vasculitis. And this laboratory studies to evaluate for vasculitis were ordered. It showed that the patient had a mildly increased ESR and a positive ANA. The patient should follow-up with primary care rheumatologist to further investigate the reason for her excluded picture and the continued pain that she's been having over the years.  Patient complains of pain which she felt was secondary to sickle cell crisis. Patient was initially being treated associated sickle cell crisis. However with a new information at the patient does not have sickle cell disease and only sickle cell trait her pain cannot be attributed to this disease process and needs to be evaluated as an outpatient for further causes for her pain. She was treated with oral analgesics on a when necessary basis.  With regard to her CVA she was seen by the stroke team and she was started on aspirin and a statin for secondary prevention of stroke. The patient does have weakness in the right upper extremity and gait impairment secondary to her stroke and she is being transferred to inpatient rehabilitation  for further physical medicine rehabilitation services.  Patient was also found to have hypertension which was untreated. No antihypertensives were given at this time as neurology advised of permissible hypertension due to her recent stroke. However at the time of discharge her blood pressure fell within the normal range.Patient be followed up as an outpatient for further evaluation of blood pressure in the ambulatory setting.  The patient complained of impaired vision and she was evaluated by ophthalmology. There are no acute findings on the bedside examination of the recommendations the patient should have outpatient follow-up in the clinic for further evaluating and testing including not not limited to the [facility] visual field.  Patient was also found to have a thyroid nodule as an incidental finding when her radiologic studies were performed. A TSH was checked which was within normal limits that she should follow-up as an outpatient for that thyroid nodule.  Patient reports that she has a history of polycystic ovarian syndrome and had been on spironolactone in the past. She should be further worked up as an outpatient with transvaginal ultrasound.  With regard to medical adherence. The patient does not have a primary care provider and should establish care upon discharge. My recommendation is that the patient is not referred to the sickle cell [facility] as this will send a mixed message the patient given the fact that she does not have sickle cell disease.     Disposition and Follow-up:  Patient being discharged to rehabilitation. Discharge Instructions    Ambulatory referral to Neurology    Complete by:  As directed    Stroke office f/u  in 1.5 month   Diet - low sodium heart healthy    Complete by:  As directed    Walk with assistance    Complete by:  As directed       DISCHARGE EXAM:  General: Alert, awake, oriented x3, in no apparent distress.  HEENT: [place]/AT PEERL, EOMI,  anicteric Neck: Trachea midline, no masses, no thyromegal,y no JVD, no carotid bruit OROPHARYNX: Moist, No exudate/ erythema/lesions.  Heart: Regular rate and rhythm, without murmurs, rubs, gallops or S3. PMI non-displaced. Exam reveals no decreased pulses. Pulmonary/Chest: Normal effort. Breath sounds normal. No. Apnea. Clear to auscultation,no stridor,  no wheezing and no rhonchi noted. No respiratory distress and no tenderness noted. Abdomen: Soft, nontender, nondistended, normal bowel sounds, no masses no hepatosplenomegaly noted. No fluid wave and no ascites. There is no guarding or rebound. Neuro: Alert and oriented to person, place and time. Right upper extremity and lower extremity weakness. Displays no atrophy or tremors and exhibits normal muscle tone. Cranial nerves II through XII grossly intact.  DTRs 2+ bilaterally upper and lower extremities.  Musculoskeletal: No warm swelling or erythema around joints, no spinal tenderness noted. Psychiatric: Patient alert and oriented x3, good insight and cognition, good recent to remote recall. Lymph node survey: No cervical axillary or inguinal lymphadenopathy noted. Skin: Skin is warm and dry. No bruising, no ecchymosis and no rash noted. Pt is not diaphoretic. No erythema. No pallor    Blood pressure (!) 129/92, pulse 69, temperature 97.8 F (36.6 C), temperature source Oral, resp. rate 16, height 5' 3"  (1.6 m), weight 102.8 kg (226 lb 9.6 oz), last menstrual period 01/31/2017, SpO2 100 %.   Recent Labs  02/27/17 1036  NA 137  K 3.7  CL 101  CO2 26  GLUCOSE 153*  BUN 16  CREATININE 0.91  CALCIUM 9.3   No results for input(s): AST, ALT, ALKPHOS, BILITOT, PROT, ALBUMIN in the last 72 hours. No results for input(s): LIPASE, AMYLASE in the last 72 hours.  Recent Labs  02/27/17 1036 03/01/17 0512  WBC 12.5* 8.7  NEUTROABS 9.5* 3.8  HGB 12.9 11.8*  HCT 39.2 35.7*  MCV 76.1* 75.6*  PLT 287 285     Total time spent  including face to face and decision making was greater than 30 minutes  Signed: Lynnann Knudsen A. 03/02/2017, 8:07 AM

## 2017-03-02 NOTE — Progress Notes (Signed)
Occupational Therapy Treatment [name] MRN: [id number] DOB: January 20, 1976 Today's Date: 03/02/2017    History of present illness Pt is a 41 y.o. female who presented to the ED with R LE weakness, R UE weakness and numbness/tingling, and R facial numbness. 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 MRI revealed acute infarct in L thalamus. Pt with PMH significant for polycystic disease (ovaries), and sickle cell disease.   OT comments  Pt making progress towards OT goals this session - focused on reinforcing HEP, and feeding. Pt relays that her vision remains blurry on right side - reviewed strategies for compensation with vision. Pt and family educated that small weight bearing exercises with RUE are beneficial. Pt continues to benefit from skilled OT in the acute setting and requires CIR level therapy to return to PLOF - she continues to be eager and willing to learn and work with therapy.   Follow Up Recommendations  CIR;Supervision/Assistance - 24 hour    Equipment Recommendations  Other (comment) (defer to next venue)    Recommendations for Other Services Rehab consult    Precautions / Restrictions Precautions Precautions: Fall Restrictions Weight Bearing Restrictions: No Other Position/Activity Restrictions: R-sided weakness       Mobility Bed Mobility               General bed mobility comments: Pt OOB in chair when OT arrived  Transfers                 General transfer comment: declined transfer this session, so that she could eat lunch    Balance Overall balance assessment: Needs assistance Sitting-balance support: No upper extremity supported;Feet supported Sitting balance-Leahy Scale: Fair Sitting balance - Comments: sitting in recliner for HEP                                   ADL either performed or assessed with clinical judgement   ADL Overall ADL's : Needs  assistance/impaired Eating/Feeding: Moderate assistance;Sitting;With caregiver independent assisting Eating/Feeding Details (indicate cue type and reason): Pt requires assist for bilateral hand tasks like cutting Grooming: Wash/dry face;Minimal assistance;Sitting;Wash/dry hands Grooming Details (indicate cue type and reason): Pt also provided with supplies for shaving                             Functional mobility during ADLs:  (declined this session) General ADL Comments: Pt reports vision has remained consistent since yesterday (still fuzzy) and able to demonstrate HEP provided yesterday (hand pronated lap slides, and composite flexion/extension)     Vision   Visual Fields: Right visual field deficit Additional Comments: Reports vision remains blurry on right side   Perception     Praxis      Cognition Arousal/Alertness: Awake/alert Behavior During Therapy: WFL for tasks assessed/performed Overall Cognitive Status: Impaired/Different from baseline Area of Impairment: Problem solving;Memory                     Memory: Decreased short-term memory       Problem Solving: Slow processing;Difficulty sequencing          Exercises     Shoulder Instructions       General Comments Pt's mother present during session    Pertinent Vitals/ Pain       Pain Assessment: No/denies pain  Home  Living                                          Prior Functioning/Environment              Frequency  Min 2X/week        Progress Toward Goals  OT Goals(current goals can now be found in the care plan section)  Progress towards OT goals: Progressing toward goals  Acute Rehab OT Goals Patient Stated Goal: to get back to normal OT Goal Formulation: With patient Time For Goal Achievement: 03/12/17 Potential to Achieve Goals: Good  Plan Discharge plan remains appropriate    Co-evaluation                 End of Session    OT  Visit Diagnosis: Hemiplegia and hemiparesis Hemiplegia - Right/Left: Right Hemiplegia - dominant/non-dominant: Non-Dominant Hemiplegia - caused by: Cerebral infarction   Activity Tolerance Patient tolerated treatment well   Patient Left in chair;with call bell/phone within reach;with chair alarm set;with family/visitor present   Nurse Communication Mobility status        Time: 1201-1221 OT Time Calculation (min): 20 min  Charges: OT General Charges $OT Visit: 1 Procedure OT Treatments $Self Care/Home Management : 8-22 mins  Sherryl Manges OTR/L [phone]   Sharon Chan Sharon Chan 03/02/2017, 1:46 PM

## 2017-03-02 NOTE — Consult Note (Signed)
New Hematology/Oncology Consult   Referral MD: Marlana Salvage      Reason for Referral: Sickle cell disease   History of present illness:   She was admitted on 02/25/2017 with acute onset of right-sided numbness and weakness. She was confirmed to have a left thalamic CVA. The stroke symptoms are improving. She was seen in consultation by the neurology service and is being transferred to rehabilitation medicine. An MRA revealed extensive medium and small vessel disease. An MRI revealed an acute left thalamic infarct and multiple remote infarcts. She has a history of "sickle cell disease ". She is followed by Dr. Thea Silversmith. She reports first presenting with a sickle cell crisis in her teens. She has had multiple hospital admissions with sickle cell crises, none in approximately 5 years. She reports receiving a single red cell transfusion 10 years ago.  Her sister and father have sickle cell trait. She reports her mother has tested negative for sickle cell trait. No other family history of sickle cell disease. Her sister is "anemic". She develops acute diffuse pain when she has a crisis. She reports chronic bilateral "hip "pain.       Past Medical History:  Diagnosis Date  . Polycystic disease, ovaries   . Sickle cell trait (HCC)     .  G0 P0   Past Surgical History:  Procedure Laterality Date  . LAPAROSCOPIC CHOLECYSTECTOMY    :   Current Facility-Administered Medications:  .  acetaminophen (TYLENOL) tablet 325-650 mg, 325-650 mg, Oral, Q4H PRN, Evlyn Kanner Love, PA-C .  alum & mag hydroxide-simeth (MAALOX/MYLANTA) 200-200-20 MG/5ML suspension 30 mL, 30 mL, Oral, Q4H PRN, Pamela S Love, PA-C .  aspirin EC tablet 325 mg, 325 mg, Oral, Daily, Pamela S Love, PA-C .  atorvastatin (LIPITOR) tablet 40 mg, 40 mg, Oral, q1800, Pamela S Love, PA-C .  bisacodyl (DULCOLAX) suppository 10 mg, 10 mg, Rectal, Daily PRN, Evlyn Kanner Love, PA-C .  cloNIDine (CATAPRES) tablet 0.1 mg, 0.1 mg, Oral,  Q4H PRN, Evlyn Kanner Love, PA-C .  diphenhydrAMINE (BENADRYL) 12.5 MG/5ML elixir 12.5-25 mg, 12.5-25 mg, Oral, Q6H PRN, Evlyn Kanner Love, PA-C .  enoxaparin (LOVENOX) injection 40 mg, 40 mg, Subcutaneous, QHS, Pamela S Love, PA-C .  feeding supplement (BOOST / RESOURCE BREEZE) liquid 1 Container, 1 Container, Oral, TID BM, Pamela S Love, PA-C .  guaiFENesin-dextromethorphan (ROBITUSSIN DM) 100-10 MG/5ML syrup 5-10 mL, 5-10 mL, Oral, Q6H PRN, Evlyn Kanner Love, PA-C .  naphazoline-glycerin (CLEAR EYES) ophth solution 1-2 drop, 1-2 drop, Both Eyes, TID WC & HS, Pamela S Love, PA-C .  polyethylene glycol (MIRALAX / GLYCOLAX) packet 17 g, 17 g, Oral, Daily PRN, Evlyn Kanner Love, PA-C .  prochlorperazine (COMPAZINE) tablet 5-10 mg, 5-10 mg, Oral, Q6H PRN **OR** prochlorperazine (COMPAZINE) injection 5-10 mg, 5-10 mg, Intramuscular, Q6H PRN **OR** prochlorperazine (COMPAZINE) suppository 12.5 mg, 12.5 mg, Rectal, Q6H PRN, Evlyn Kanner Love, PA-C .  sodium phosphate (FLEET) 7-19 GM/118ML enema 1 enema, 1 enema, Rectal, Once PRN, Jacquelynn Cree, PA-C .  traMADol (ULTRAM) tablet 50 mg, 50 mg, Oral, Q6H PRN, Evlyn Kanner Love, PA-C .  traZODone (DESYREL) tablet 25-50 mg, 25-50 mg, Oral, QHS PRN, Evlyn Kanner Love, PA-C .  vitamin B-12 (CYANOCOBALAMIN) tablet 100 mcg, 100 mcg, Oral, Daily, Pamela S Love, PA-C:  . aspirin EC  325 mg Oral Daily  . atorvastatin  40 mg Oral q1800  . enoxaparin (LOVENOX) injection  40 mg Subcutaneous QHS  . feeding supplement  1 Container Oral  TID BM  . naphazoline-glycerin  1-2 drop Both Eyes TID WC & HS  . vitamin B-12  100 mcg Oral Daily  :  No Known Allergies:  Family History  Problem Relation Age of Onset  . Heart attack Mother   . Post-traumatic stress disorder Father     committed suicide  . Diabetes Father   . Hypertension Sister      .  Sickle cell trait                                  sister   Social History   Social History  . Marital status: Married    Spouse name: N/A  .  Number of children: N/A  . Years of education: N/A   Occupational History  . Not on file.   Social History Main Topics  . Smoking status: Never Smoker  . Smokeless tobacco: Never Used  . Alcohol use No  . Drug use: Unknown  . Sexual activity: Not on file   Other Topics Concern  . Not on file   Social History Narrative  . No narrative on file  :  Review of Systems:  Positives include:Right sided weakness and numbness, chronic bilateral "hip "pain  A complete ROS was otherwise negative.   Physical Exam:   HEENT: No thrush Lungs: Clear bilaterally, no respiratory distress Cardiac: Regular rate and rhythm Abdomen: No hepatosplenomegaly  Vascular: No leg edema Lymph nodes: No cervical, supraclavicular, or axillary nodes Neurologic: Alert and oriented, the tongue deviates to the left, 4/5 strength in the right arm and right leg/foot Right upper chest Port-A-Cath with a gauze dressing  LABS:   Recent Labs  03/01/17 0512  WBC 8.7  HGB 11.8*  HCT 35.7*  PLT 285  Hemoglobin electrophoresis: Hemolytic day 61.7, hemoglobin S 34.9, heme woman 823.4 Blood smear: 02/28/2017-refrigerator blood, mild increased polychromasia, burr cells, few ovalocytes, no sickle forms. The white cell morphology is unremarkable. The platelets appear normal in number. RADIOLOGY:  Ct Angio Head W Or Wo Contrast  Addendum Date: 02/25/2017   ADDENDUM REPORT: 02/25/2017 14:30 ADDENDUM: Preliminary report of this exam was discussed by telephone with Dr. Johnn Hai on 02/25/2017 at 1343 hours. Electronically Signed   By: Odessa Fleming M.D.   On: 02/25/2017 14:30   Result Date: 02/25/2017 CLINICAL DATA:  41 year old female with RIGHT side weakness. Confusion. Sickle cell disease. EXAM: CT ANGIOGRAPHY HEAD AND NECK TECHNIQUE: Multidetector CT imaging of the head and neck was performed using the standard protocol during bolus administration of intravenous contrast. Multiplanar CT image reconstructions and MIPs  were obtained to evaluate the vascular anatomy. Carotid stenosis measurements (when applicable) are obtained utilizing NASCET criteria, using the distal internal carotid diameter as the denominator. CONTRAST:  100 mL Isovue 370 COMPARISON:  Head CT without contrast 1259 hours today. FINDINGS: CTA NECK Skeleton: No acute osseous abnormality identified. Cervical and thoracic endplates are relatively preserved. Visualized paranasal sinuses and mastoids are stable and well pneumatized. Upper chest: Partially visible cardiomegaly. Negative visualized thoracic aorta. No superior mediastinal lymphadenopathy. Negative visible lung parenchyma except for atelectasis. Other neck: Thyroid remarkable for 18 mm hypodense nodule on the left. Negative larynx, pharynx, parapharyngeal spaces, retropharyngeal space (partially retropharyngeal right carotid), sublingual space, submandibular glands and parotid glands. No cervical lymphadenopathy. Aortic arch: 3 vessel arch configuration, no atherosclerosis. Enhancement of small venous collaterals about the right shoulder. A right IJ approach porta cath is  in place, and there appears to be functional stenosis of the right subclavian artery. Right carotid system: Mildly tortuous brachiocephalic artery with no stenosis. Negative proximal right CCA. Partially retropharyngeal right carotid bifurcation. Negative cervical right ICA aside from mild tortuosity. Left carotid system: Negative. Vertebral arteries:No proximal right subclavian stenosis. Non dominant right vertebral artery. Right vertebral artery origin and cervical right vertebral arteries are within normal limits. No proximal left subclavian artery stenosis. Dominant left vertebral artery. No left vertebral origin stenosis. Tortuous left V1 segment. Mildly tortuous left V2 segment, no stenosis to the skullbase. CTA HEAD Posterior circulation: Distal left vertebral artery is dominant. No distal vertebral stenosis. Both PICA origins  are patent. No basilar stenosis. Normal SCA origins. Normal left PCA origin. Fetal type right PCA. Left posterior communicating artery is present. Mild right P1 and P2 and moderate to severe left P1 and P2, irregularity and stenosis as seen on series 407, image 52. Preserved bilateral distal PCA flow. Anterior circulation: Both ICA siphons are patent without atherosclerosis or stenosis. Normal ophthalmic and posterior communicating artery origins. Patent carotid termini. However, there is moderate irregularity and stenosis at the right M1 origin (series 403, image 108). The right MCA M1 segment is patent but there is moderate to severe irregularity and stenosis also in the mid M1 (series 403, image 103 and see also series 407, image 52). Despite this the right MCA bifurcation is patent. No right M2 branch occlusion identified. Right MCA M2 and M3 branches are within normal limits. Left MCA origin and proximal M1 segment are normal. There is mild irregularity in the mid left M1 segment. The left MCA bifurcation is patent. There is moderate left M2 and moderate to severe left M3 irregularity and stenosis as seen on series 406, image 32. ACA origins and anterior communicating arteries are within normal limits. Proximal A2 branches are within normal limits but there is severe left ACA A2 segment stenosis near the callosomarginal artery, and moderate to severe bilateral distal ACA/pericallosal artery irregularity and stenosis (series 406, image 25). Venous sinuses: Patent. Anatomic variants: Dominant left vertebral artery. Fetal type right PCA origin. Review of the MIP images confirms the above findings IMPRESSION: 1. No atherosclerosis or stenosis in the neck, ICA siphons, or basilar artery. 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 Still, there is no emergent large vessel occlusion or target for endovascular intervention identified. 2. Moderate or severe stenoses  of the: - Left PCA P1 and P2, - Right MCA origin and M1, - Left ACA A2 and bilateral distal pericallosal arteries, - Left MCA M2 and M3. 3. Left thyroid 18 mm nodule meets consensus criteria for nonemergent follow-up Thyroid Ultrasound characterization. Electronically Signed: By: Odessa Fleming M.D. On: 02/25/2017 13:57   Dg Chest 2 View  Result Date: 02/25/2017 CLINICAL DATA:  Right side numbness, facial droop, history of sickle cell disease EXAM: CHEST  2 VIEW COMPARISON:  05/07/2006 FINDINGS: Borderline cardiomegaly. Right IJ Port-A-Cath with tip in upper SVC is unchanged in position. No acute infiltrate or pulmonary edema. Mild left basilar atelectasis. Mild degenerative changes mid thoracic spine. IMPRESSION: No active cardiopulmonary disease.  Mild left basilar atelectasis. Electronically Signed   By: Natasha Mead M.D.   On: 02/25/2017 17:19   Ct Angio Neck W And/or Wo Contrast  Addendum Date: 02/25/2017   ADDENDUM REPORT: 02/25/2017 14:30 ADDENDUM: Preliminary report of this exam was discussed by telephone with Dr. Johnn Hai on 02/25/2017 at 1343 hours.  Electronically Signed   By: Odessa Fleming M.D.   On: 02/25/2017 14:30   Result Date: 02/25/2017 CLINICAL DATA:  41 year old female with RIGHT side weakness. Confusion. Sickle cell disease. EXAM: CT ANGIOGRAPHY HEAD AND NECK TECHNIQUE: Multidetector CT imaging of the head and neck was performed using the standard protocol during bolus administration of intravenous contrast. Multiplanar CT image reconstructions and MIPs were obtained to evaluate the vascular anatomy. Carotid stenosis measurements (when applicable) are obtained utilizing NASCET criteria, using the distal internal carotid diameter as the denominator. CONTRAST:  100 mL Isovue 370 COMPARISON:  Head CT without contrast 1259 hours today. FINDINGS: CTA NECK Skeleton: No acute osseous abnormality identified. Cervical and thoracic endplates are relatively preserved. Visualized paranasal sinuses and mastoids  are stable and well pneumatized. Upper chest: Partially visible cardiomegaly. Negative visualized thoracic aorta. No superior mediastinal lymphadenopathy. Negative visible lung parenchyma except for atelectasis. Other neck: Thyroid remarkable for 18 mm hypodense nodule on the left. Negative larynx, pharynx, parapharyngeal spaces, retropharyngeal space (partially retropharyngeal right carotid), sublingual space, submandibular glands and parotid glands. No cervical lymphadenopathy. Aortic arch: 3 vessel arch configuration, no atherosclerosis. Enhancement of small venous collaterals about the right shoulder. 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 Right carotid system: Mildly tortuous brachiocephalic artery with no stenosis. Negative proximal right CCA. Partially retropharyngeal right carotid bifurcation. Negative cervical right ICA aside from mild tortuosity. Left carotid system: Negative. Vertebral arteries:No proximal right subclavian stenosis. Non dominant right vertebral artery. Right vertebral artery origin and cervical right vertebral arteries are within normal limits. No proximal left subclavian artery stenosis. Dominant left vertebral artery. No left vertebral origin stenosis. Tortuous left V1 segment. Mildly tortuous left V2 segment, no stenosis to the skullbase. CTA HEAD Posterior circulation: Distal left vertebral artery is dominant. No distal vertebral stenosis. Both PICA origins are patent. No basilar stenosis. Normal SCA origins. Normal left PCA origin. Fetal type right PCA. Left posterior communicating artery is present. Mild right P1 and P2 and moderate to severe left P1 and P2, irregularity and stenosis as seen on series 407, image 52. Preserved bilateral distal PCA flow. Anterior circulation: Both ICA siphons are patent without atherosclerosis or stenosis. Normal ophthalmic and posterior communicating artery origins.  Patent carotid termini. However, there is moderate irregularity and stenosis at the right M1 origin (series 403, image 108). The right MCA M1 segment is patent but there is moderate to severe irregularity and stenosis also in the mid M1 (series 403, image 103 and see also series 407, image 52). Despite this the right MCA bifurcation is patent. No right M2 branch occlusion identified. Right MCA M2 and M3 branches are within normal limits. Left MCA origin and proximal M1 segment are normal. There is mild irregularity in the mid left M1 segment. The left MCA bifurcation is patent. There is moderate left M2 and moderate to severe left M3 irregularity and stenosis as seen on series 406, image 32. ACA origins and anterior communicating arteries are within normal limits. Proximal A2 branches are within normal limits but there is severe left ACA A2 segment stenosis near the callosomarginal artery, and moderate to severe bilateral distal ACA/pericallosal artery irregularity and stenosis (series 406, image 25). Venous sinuses: Patent. Anatomic variants: Dominant left vertebral artery. Fetal type right PCA origin. Review of the MIP images confirms the above findings IMPRESSION: 1. No atherosclerosis or stenosis in the neck, ICA siphons, or basilar artery. But there is  widespread first and second order circle of Willis branch irregularity and stenosis. Still, there is no emergent large vessel occlusion or target for endovascular intervention identified. 2. Moderate or severe stenoses of the: - Left PCA P1 and P2, - Right MCA origin and M1, - Left ACA A2 and bilateral distal pericallosal arteries, - Left MCA M2 and M3. 3. Left thyroid 18 mm nodule meets consensus criteria for nonemergent follow-up Thyroid Ultrasound characterization. Electronically Signed: By: Odessa Fleming M.D. On: 02/25/2017 13:57   Mr Maxine Glenn Head Wo Contrast  Result Date: 02/25/2017 CLINICAL DATA:  Sudden onset of right upper and lower extremity weakness and  facial droop. Hypertensive crisis. Sickle cell disease. EXAM: MRI HEAD WITHOUT CONTRAST MRA HEAD WITHOUT CONTRAST TECHNIQUE: Multiplanar, multiecho pulse sequences of the brain and surrounding structures were obtained without intravenous contrast. Angiographic images of the head were obtained using MRA technique without contrast. COMPARISON:  CT head without contrast and CTA head and neck from the same day. FINDINGS: MRI HEAD FINDINGS Brain: Diffusion-weighted images demonstrate restricted diffusion within the 80 left lack infarct noted on CT. Areas of T2 shine through or noted in the corona radiata bilaterally. No additional acute infarcts are present. There are remote infarcts involving the right basal ganglia. Scattered subcortical T2 hyperintensities are highly advanced for age. Bilateral T2 hyperintensities are present within the corona radiata. A remote infarct is present in the genu of the left internal capsule. A remote infarct is noted along the left side of the body of the corpus callosum. Vascular: Flow is present in the major intracranial arteries. Skull and upper cervical spine: The skullbase is within normal limits. The craniocervical junction is unremarkable. Midline sagittal structures are within normal limits. The upper cervical spine is normal. Sinuses/Orbits: The paranasal sinuses and mastoid air cells are clear. The globes and orbits are within normal limits MRA HEAD FINDINGS The internal carotid arteries are within normal limits the high cervical segments through the ICA termini bilaterally. Mild narrowing is again seen at the origins of the right A1 and M1 segments. There is moderate narrowing of the distal right M1 segment. Segmental narrowing present throughout the MCA branch vessels bilaterally. The left vertebral artery is the dominant vessel. PICA origins are visualized and normal bilaterally. The basilar artery is normal. A moderate stenosis present proximal left posterior cerebral  artery. The right posterior cerebral artery is of fetal type. There is cecum at attenuation distal PCA branch vessels bilaterally. IMPRESSION: 1. 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 2. Remote infarcts involving the basal ganglia bilaterally. 3. Scattered white matter changes bilaterally are mildly advanced for age. This likely also reflects the sequela of microvascular ischemia or vasculitis. 4. Remote infarcts involving the body of the corpus callosum. 5. Extensive medium and small vessel disease evident on the MRA as described. Electronically Signed   By: Marin Roberts M.D.   On: 02/25/2017 15:07   Mr Brain Wo Contrast  Result Date: 02/25/2017 CLINICAL DATA:  Sudden onset of right upper and lower extremity weakness and facial droop. Hypertensive crisis. Sickle cell disease. EXAM: MRI HEAD WITHOUT CONTRAST MRA HEAD WITHOUT CONTRAST TECHNIQUE: Multiplanar, multiecho pulse sequences of the brain and surrounding structures were obtained without intravenous contrast. Angiographic images of the head were obtained using MRA technique without contrast. COMPARISON:  CT head without contrast and CTA head and neck from the same day. FINDINGS: MRI HEAD FINDINGS Brain: Diffusion-weighted images demonstrate restricted diffusion within the 80 left lack infarct noted  on CT. Areas of T2 shine through or noted in the corona radiata bilaterally. No additional acute infarcts are present. There are remote infarcts involving the right basal ganglia. Scattered subcortical T2 hyperintensities are highly advanced for age. Bilateral T2 hyperintensities are present within the corona radiata. A remote infarct is present in the genu of the left internal capsule. A remote infarct is noted along the left side of the body of the corpus callosum. Vascular: Flow is present in the major intracranial arteries. Skull and upper cervical spine: The skullbase is within normal limits. The  craniocervical junction is unremarkable. Midline sagittal structures are within normal limits. The upper cervical spine is normal. Sinuses/Orbits: The paranasal sinuses and mastoid air cells are clear. The globes and orbits are within normal limits MRA HEAD FINDINGS The internal carotid arteries are within normal limits the high cervical segments through the ICA termini bilaterally. Mild narrowing is again seen at the origins of the right A1 and M1 segments. There is moderate narrowing of the distal right M1 segment. Segmental narrowing present throughout the MCA branch vessels bilaterally. The left vertebral artery is the dominant vessel. PICA origins are visualized and normal bilaterally. The basilar artery is normal. A moderate stenosis present proximal left posterior cerebral artery. The right posterior cerebral artery is of fetal type. There is cecum at attenuation distal PCA branch vessels bilaterally. IMPRESSION: 1. 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 2. Remote infarcts involving the basal ganglia bilaterally. 3. Scattered white matter changes bilaterally are mildly advanced for age. This likely also reflects the sequela of microvascular ischemia or vasculitis. 4. Remote infarcts involving the body of the corpus callosum. 5. Extensive medium and small vessel disease evident on the MRA as described. Electronically Signed   By: Marin Roberts M.D.   On: 02/25/2017 15:07   Ct Head Code Stroke W/o Cm  Result Date: 02/25/2017 CLINICAL DATA:  Code stroke. 41 year old female last seen normal this morning. Left facial droop and left side numbness. Initial encounter. Sickle cell disease EXAM: CT HEAD WITHOUT CONTRAST TECHNIQUE: Contiguous axial images were obtained from the base of the skull through the vertex without intravenous contrast. COMPARISON:  Report of brain MRI 07/11/1999 (no images available). FINDINGS: Brain: Overall normal cerebral volume. Chronic  appearing lacunar infarct right basal ganglia. Small age indeterminate hypodensity left thalamus (series 21, image 14). Focal chronic appearing lacunar infarct in the body of the corpus callosum (coronal image 34). Other small areas of cerebral white matter hypodensity. No midline shift, ventriculomegaly, mass effect, evidence of mass lesion, intracranial hemorrhage or evidence of cortically based acute infarction. Vascular: No suspicious intracranial vascular hyperdensity. Skull: Negative. Sinuses/Orbits: Clear. Other: Negative orbit and scalp soft tissues. ASPECTS [facility] Stroke Program Early CT Score) - Ganglionic level infarction (caudate, lentiform nuclei, internal capsule, insula, M1-M3 cortex): 7 - Supraganglionic infarction (M4-M6 cortex): 3 Total score (0-10 with 10 being normal): 10 IMPRESSION: 1. No acute cortically based infarct or acute intracranial hemorrhage. ASPECTS is 10. 2. Evidence of scattered small vessel ischemia, with chronic appearing involvement of the corpus callosum and right basal ganglia. Small age indeterminate area in the left thalamus. 3. The above was relayed via text pager to Dr. Johnn Hai on 02/25/2017 at 13:09 . Electronically Signed   By: Odessa Fleming M.D.   On: 02/25/2017 13:09    Assessment and Plan:   1. Sickle cell trait 2. Microcytic anemia 3. Acute left thalamic CVA 4. Cerebrovascular disease 5. Polycystic ovarian disease  6. Hypertension 7. Report of sickle cell crises 8. Port-A-Cath  She has a clinical history consistent with a mild form of sickle cell anemia, but a hemoglobin electrophoresis reveals sickle cell trait. 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 The hemoglobin S concentration is higher in patients with coexisting hemoglobin S and beta thalassemia+.  It would be unusual to have sickle cell crises with this hemoglobin genotype.  It is  unclear whether the cerebrovascular disease is related to the sickle cell trait or another etiology.  Recommendations: 1. Management of the CVA and cerebral vascular disease per neurology 2. Consider imaging of the hips to look for evidence of sickle cell bone disease 3. I will consult with Dr. Katherene Ponto on the hematology service at [facility] to get his opinion regarding the possibility of sickle cell crises in a patient with sickle cell trait and alpha thalassemia 4. Submit testing for alpha globin gene mapping   Thornton Papas, MD 03/02/2017, 6:50 PM

## 2017-03-03 ENCOUNTER — Inpatient Hospital Stay (HOSPITAL_COMMUNITY): Payer: Medicare Other

## 2017-03-03 ENCOUNTER — Inpatient Hospital Stay (HOSPITAL_COMMUNITY): Payer: Medicare Other | Admitting: Speech Pathology

## 2017-03-03 ENCOUNTER — Encounter (HOSPITAL_COMMUNITY): Payer: Self-pay | Admitting: Neurology

## 2017-03-03 ENCOUNTER — Inpatient Hospital Stay (HOSPITAL_COMMUNITY): Payer: Medicare Other | Admitting: *Deleted

## 2017-03-03 ENCOUNTER — Inpatient Hospital Stay (HOSPITAL_COMMUNITY): Payer: Medicare Other | Admitting: Occupational Therapy

## 2017-03-03 DIAGNOSIS — G8191 Hemiplegia, unspecified affecting right dominant side: Secondary | ICD-10-CM

## 2017-03-03 DIAGNOSIS — I639 Cerebral infarction, unspecified: Secondary | ICD-10-CM

## 2017-03-03 LAB — URINALYSIS, ROUTINE W REFLEX MICROSCOPIC
BILIRUBIN URINE: NEGATIVE
Glucose, UA: NEGATIVE mg/dL
KETONES UR: NEGATIVE mg/dL
NITRITE: NEGATIVE
Protein, ur: 100 mg/dL — AB
Specific Gravity, Urine: 1.027 (ref 1.005–1.030)
pH: 5 (ref 5.0–8.0)

## 2017-03-03 LAB — CBC WITH DIFFERENTIAL/PLATELET
Basophils Absolute: 0 10*3/uL (ref 0.0–0.1)
Basophils Relative: 1 %
Eosinophils Absolute: 0.4 10*3/uL (ref 0.0–0.7)
Eosinophils Relative: 5 %
HEMATOCRIT: 36.5 % (ref 36.0–46.0)
Hemoglobin: 12.6 g/dL (ref 12.0–15.0)
LYMPHS ABS: 3.2 10*3/uL (ref 0.7–4.0)
LYMPHS PCT: 42 %
MCH: 25.7 pg — AB (ref 26.0–34.0)
MCHC: 34.5 g/dL (ref 30.0–36.0)
MCV: 74.5 fL — AB (ref 78.0–100.0)
Monocytes Absolute: 0.5 10*3/uL (ref 0.1–1.0)
Monocytes Relative: 7 %
NEUTROS PCT: 45 %
Neutro Abs: 3.6 10*3/uL (ref 1.7–7.7)
Platelets: 264 10*3/uL (ref 150–400)
RBC: 4.9 MIL/uL (ref 3.87–5.11)
RDW: 15.3 % (ref 11.5–15.5)
WBC: 7.7 10*3/uL (ref 4.0–10.5)

## 2017-03-03 LAB — TYPE AND SCREEN
ABO/RH(D): B POS
ANTIBODY SCREEN: NEGATIVE
DAT, IGG: NEGATIVE
Unit division: 0

## 2017-03-03 LAB — PROTEIN S ACTIVITY: PROTEIN S ACTIVITY: 136 % (ref 63–140)

## 2017-03-03 LAB — URINALYSIS, MICROSCOPIC (REFLEX)

## 2017-03-03 LAB — LUPUS ANTICOAGULANT PANEL
DRVVT: 37.1 s (ref 0.0–47.0)
PTT Lupus Anticoagulant: 34.8 s (ref 0.0–51.9)

## 2017-03-03 LAB — COMPREHENSIVE METABOLIC PANEL
ALK PHOS: 63 U/L (ref 38–126)
ALT: 23 U/L (ref 14–54)
AST: 22 U/L (ref 15–41)
Albumin: 3.3 g/dL — ABNORMAL LOW (ref 3.5–5.0)
Anion gap: 8 (ref 5–15)
BILIRUBIN TOTAL: 0.4 mg/dL (ref 0.3–1.2)
BUN: 13 mg/dL (ref 6–20)
CALCIUM: 8.9 mg/dL (ref 8.9–10.3)
CO2: 25 mmol/L (ref 22–32)
CREATININE: 0.83 mg/dL (ref 0.44–1.00)
Chloride: 105 mmol/L (ref 101–111)
GFR calc Af Amer: >60 mL/min (ref >60)
Glucose, Bld: 120 mg/dL — ABNORMAL HIGH (ref 65–99)
Potassium: 3.5 mmol/L (ref 3.5–5.1)
Sodium: 138 mmol/L (ref 135–145)
TOTAL PROTEIN: 5.8 g/dL — AB (ref 6.5–8.1)

## 2017-03-03 LAB — RHEUMATOID FACTOR: Rhuematoid fact SerPl-aCnc: <10 IU/mL (ref 0.0–13.9)

## 2017-03-03 LAB — PROTEIN S, TOTAL: PROTEIN S AG TOTAL: 135 % (ref 60–150)

## 2017-03-03 LAB — BPAM RBC
Blood Product Expiration Date: 201805222359
UNIT TYPE AND RH: 1700

## 2017-03-03 LAB — HOMOCYSTEINE: Homocysteine: 12.1 umol/L (ref 0.0–15.0)

## 2017-03-03 LAB — PROTEIN C ACTIVITY: PROTEIN C ACTIVITY: 166 % (ref 73–180)

## 2017-03-03 MED ORDER — TOPIRAMATE 25 MG PO TABS
25.0000 mg | ORAL_TABLET | Freq: Every day | ORAL | Status: DC
  Administered 2017-03-03 – 2017-03-05 (×3): 25 mg via ORAL
  Filled 2017-03-03 (×4): qty 1

## 2017-03-03 MED ORDER — CYCLOBENZAPRINE HCL 5 MG PO TABS
5.0000 mg | ORAL_TABLET | Freq: Three times a day (TID) | ORAL | Status: DC | PRN
Start: 2017-03-03 — End: 2017-03-11
  Administered 2017-03-03 – 2017-03-10 (×10): 5 mg via ORAL
  Filled 2017-03-03 (×11): qty 1

## 2017-03-03 NOTE — IPOC Note (Signed)
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 (HCC)     Functional Problem List: Nursing Bowel, Medication Management, Pain, Skin Integrity, Safety  PT Balance, Edema, Endurance, Motor, Pain, Perception, Safety, Sensory  OT Balance, Safety, Cognition, Endurance, Motor, Sensory, Vision  SLP Linguistic  TR         Basic ADL's: OT Grooming, Bathing, Dressing, Toileting     Advanced  ADL's: OT Simple Meal Preparation, Laundry     Transfers: PT Bed Mobility, Bed to Chair, Set designer, Occupational psychologist, Research scientist (life sciences): PT Ambulation, Psychologist, prison and probation services, Stairs     Additional Impairments: OT Fuctional Use of Upper Extremity  SLP Communication expression    TR      Anticipated Outcomes Item Anticipated Outcome  Self Feeding N/A  Swallowing      Basic self-care  Mod I   Toileting  Mod I    Bathroom Transfers Mod I   Bowel/Bladder  BM QD or QOD with laxatives as ordered.  Remain continent urine without retention, no s/s infection.  Transfers  Mod I   Locomotion  Mod I in household, S in community  Communication  Mod I  Cognition     Pain  MAnaged at goal 2/10  Safety/Judgment  Increased safety awareness, no falls, injury this admission.   Therapy Plan: PT Intensity: Minimum of 1-2 x/day ,45 to 90 minutes PT Frequency: 5 out of 7 days PT Duration Estimated Length of Stay: 7-10 days OT Intensity: Minimum of 1-2 x/day, 45 to 90 minutes OT Frequency: 5 out of 7 days OT Duration/Estimated Length of Stay: 7-10 days SLP Intensity: Minumum of 1-2 x/day, 30 to 90 minutes SLP Frequency: 3 to 5 out of 7 days SLP Duration/Estimated Length of Stay: 7 days for ST       Team Interventions: Nursing Interventions Patient/Family Education, Bowel Management, Disease  Management/Prevention, Pain Management, Medication Management, Psychosocial Support, Skin Care/Wound Management, Discharge Planning  PT interventions Ambulation/gait training, Balance/vestibular training, Community reintegration, Discharge planning, Disease management/prevention, DME/adaptive equipment instruction, Neuromuscular re-education, Functional mobility training, Pain management, Patient/family education, Psychosocial support, Splinting/orthotics, Stair training, Therapeutic Activities, Therapeutic Exercise, UE/LE Strength taining/ROM, UE/LE Coordination activities, Visual/perceptual remediation/compensation, Wheelchair propulsion/positioning  OT Interventions Discharge planning, Therapeutic Activities, Self Care/advanced ADL retraining, UE/LE Coordination activities, Cognitive remediation/compensation, Functional mobility training, Patient/family education, Therapeutic Exercise, DME/adaptive equipment instruction, Psychosocial support, Neuromuscular re-education  SLP Interventions Speech/Language facilitation, Patient/family education, Functional tasks  TR Interventions    SW/CM Interventions Discharge Planning, Psychosocial Support, Patient/Family Education    Team Discharge Planning: Destination: PT-Home ,OT- Home , SLP-Home Projected Follow-up: PT-Home health PT, OT-  Outpatient OT, SLP-None Projected Equipment Needs: PT-Cane, Rolling walker with 5" wheels, To be determined, OT- Tub/shower bench, To be determined, SLP-None recommended by SLP Equipment Details: PT-Maynot need AD at DC depending on progress, OT-  Patient/family involved in discharge planning: PT- Patient,  OT-Patient, SLP-Patient  MD ELOS: 7-10 days Medical Rehab Prognosis:  Excellent Assessment: The patient has been admitted for CIR therapies with the diagnosis of right MCA infarct. The team will be addressing functional mobility, strength, stamina, balance, safety, adaptive techniques and equipment, self-care, bowel and  bladder mgt, patient and caregiver education, NMR, cognitive perceptual rx, communication, swallowing, community reintegration. Goals have been set at [facility] for mobility and self-care/ADL's, cognition/communication   Earna Coder  Alen Blew, MD, FAAPMR      See Team Conference Notes for weekly updates to the plan of care

## 2017-03-03 NOTE — Significant Event (Signed)
Patient notified for pain med 0042 complained of headache, pain meds given patient stated that her left side of her face numb vital checked BP 157/114 manuel 155/110, Thomas NP notified order to give prn clodine given (stated call back in hour if BP doesn't improve) and CT scan of head, 0049 Clodine given patient unable to stated year, where she was, RR notified 107 RR patient unable to stated correct name stated ROC unable to identify pen stated it was an eraser 110 BP 158/104 RR assisted with transfer to CT, CT negative, RR notified On call Neuro MD orders given 145 patient able to verbalize name and where she is, RR reassessed patient improved, able to verbalize name and place, 214 patient AxOx4 BP 154/94, Thomas NP notified of result no further orders will continue to monitor

## 2017-03-03 NOTE — Progress Notes (Signed)
Tried multiple offices for Rheumatology consult--Dr. Corliss Skains, Dr. Dierdre Forth and Dr. Nickola Major do not come to the hospital. Dr. Coral Spikes has retired. Will attempt to set follow up at discharge.

## 2017-03-03 NOTE — Evaluation (Signed)
Speech Language Pathology Assessment and Plan  Patient Details  Name: Sharon Chan MRN: [id number] Date of Birth: July 09, 1976  SLP Diagnosis: Aphasia  Rehab Potential: Excellent ELOS: 7 days for ST    Today's Date: 03/03/2017 SLP Individual Time: 0930-1030 SLP Individual Time Calculation (min): 60 min   Problem List:  Patient Active Problem List   Diagnosis Date Noted  . 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 Sickle cell trait ([place])   . Hb-SS disease with crisis ([place])   . Hyperlipidemia   . Thyroid nodule   . Changes in vision   . Acute ischemic stroke ([place])   . Dysphagia, post-stroke   . Benign essential HTN   . Polycystic kidney disease   . Prediabetes   . Nonintractable headache   . Leukocytosis   . Stroke (cerebrum) ([place]) 02/25/2017  . Stroke [facility]) 02/25/2017   Past Medical History:  Past Medical History:  Diagnosis Date  . Polycystic disease, ovaries   . Sickle cell trait [facility])    Past Surgical History:  Past Surgical History:  Procedure Laterality Date  . LAPAROSCOPIC CHOLECYSTECTOMY      Assessment / Plan / Recommendation Clinical Impression Sharon R Richardsonis a 41 y.o. left handed female with reported history of SSD, PCOS who was admitted on 02/25/17 with RLE weakness and numbness. History taken from chart review and patient. Blood pressures elevated requiring IV labetalol. 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 CTA head/neck done revealing widespread moderate severe stenosis, bilateral PCA, left MCA, left ACAand 18 mm left thyroid nodule--non-emergent follow up recommended. HIV and RPR negative. 2 D echo with EF 60-65% with no wall abnormality. Dr. Zigmund Chan consulted for input on questionable SS vasculopathy and Hb electrophoresis ordered for work up as blood  smear without large burden of sickle cells. She was treated for SS crises and no indication for transfusion. Patient without documentation of SSD and electrophoresis consistent with sickle cell trait and question of JRA. Coagulopathy panel ordered today for work up. Anemia panel negative. She reported decrease in vision with waxing and waning of vision therefore opthalmology consulted for input. Exam by Dr. Prudencio Chan was negative for acute findings and patient to follow up on outpatient basis. Dr. Leonie Chan feels that stroke due small vessel disease v/so vasculopathy and patient on ASA for secondary stroke prevention. Patient feels like her vision problem has been improving. Still notes numbness on the right side of her face and right side of her body. Pt admited to CIR on 03/02/17.   Bedside swallow and speech-language evaluations completed on 03/03/17. Pt is at mild risk for aspiration following general aspiration precautions when consuming regular diet with thin liquids. Pt with continued report of blurry vision so MOCA Blind was administered. Pt obtained a score of 19 out of 22 on MOCA Blind (n=> 18). Pt's functional cognitive abilities appear within normal limits as well. Pt presents with mild expressive aphasia as evidenced by very mild word finding deficits during complex unstructured conversation. Therefore skilled ST is required to increase pt's expressive abilities and increased functional independence prior to discharge. Do not anticipate that pt will need follow up ST services at discharge.    Skilled Therapeutic Interventions          Skilled treatment session focused on completion of bedside swallow and speech-language evaluations, see above. Results shared with pt,  POC established with pt agreeable.    SLP Assessment  Patient will need skilled Speech Lanaguage Pathology Services during CIR admission    Recommendations  SLP Diet Recommendations: Age appropriate regular solids;Thin Liquid  Administration via: Straw;Cup Medication Administration: Whole meds with liquid Supervision: Patient able to self feed Compensations: Slow rate;Small sips/bites;Lingual sweep for clearance of pocketing Postural Changes and/or Swallow Maneuvers: Seated upright 90 degrees;Upright 30-60 min after meal Oral Care Recommendations: Oral care BID Patient destination: Home Follow up Recommendations: None Equipment Recommended: None recommended by SLP    SLP Frequency 3 to 5 out of 7 days   SLP Duration  SLP Intensity  SLP Treatment/Interventions 7 days for ST  Minumum of 1-2 x/day, 30 to 90 minutes  Speech/Language facilitation;Patient/family education;Functional tasks    Pain Pain Assessment Pain Assessment: 0-10 Pain Score: 8  Pain Type: Acute pain Pain Location: Head Pain Orientation: Right;Left;Anterior Pain Descriptors / Indicators: Aching Pain Onset: On-going Patients Stated Pain Goal: 1 Pain Intervention(s): RN made aware (Premedicated) Multiple Pain Sites: No  Prior Functioning Cognitive/Linguistic Baseline: Within functional limits Type of Home: Apartment  Lives With: Alone Available Help at Discharge: Family;Available 24 hours/day Education: high school Vocation: On disability  Function:  Eating Eating   Modified Consistency Diet: No Eating Assist Level: Swallowing techniques: self managed           Cognition Comprehension Comprehension assist level: Follows complex conversation/direction with extra time/assistive device  Expression   Expression assist level: Expresses complex ideas: With extra time/assistive device  Social Interaction Social Interaction assist level: Interacts appropriately with others with medication or extra time (anti-anxiety, antidepressant).  Problem Solving Problem solving assist level: Solves complex problems: Recognizes & self-corrects  Memory Memory assist level: Recognizes or recalls 90% of the time/requires cueing < 10% of the  time   Short Term Goals: Week 1: SLP Short Term Goal 1 (Week 1): Pt will utilize word finding strategies at the unstructured simple conversation level with supervision cues.  SLP Short Term Goal 2 (Week 1): Pt will self-monitor and self-correct expressive communication with supervision cues.   Refer to Care Plan for Long Term Goals  Recommendations for other services: Neuropsych  Discharge Criteria: Patient will be discharged from SLP if patient refuses treatment 3 consecutive times without medical reason, if treatment goals not met, if there is a change in medical status, if patient makes no progress towards goals or if patient is discharged from hospital.  The above assessment, treatment plan, treatment alternatives and goals were discussed and mutually agreed upon: by patient   Anahy Esh B. Rutherford Nail, [name], [name] Speech-Language Pathologist   Adaya Garmany 03/03/2017, 12:40 PM

## 2017-03-03 NOTE — Evaluation (Signed)
Occupational Therapy Assessment and Plan  Patient Details  Name: Sharon Chan MRN: [id number] Date of Birth: [birth year]  OT Diagnosis: cognitive deficits, disturbance of vision, hemiplegia affecting non-dominant side and muscle weakness (generalized) Rehab Potential: Rehab Potential (ACUTE ONLY): Good ELOS: 7-10 days   Today's Date: 03/03/2017 OT Individual Time: 9407-6808 OT Individual Time Calculation (min): 67 min     Problem List:  Patient Active Problem List   Diagnosis Date Noted  . 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 Sickle cell trait ([place])   . Hb-SS disease with crisis ([place])   . Hyperlipidemia   . Thyroid nodule   . Changes in vision   . Acute ischemic stroke ([employer])   . Dysphagia, post-stroke   . Benign essential HTN   . Polycystic kidney disease   . Prediabetes   . Nonintractable headache   . Leukocytosis   . Stroke (cerebrum) ([place]) 02/25/2017  . Stroke [facility]) 02/25/2017    Past Medical History:  Past Medical History:  Diagnosis Date  . Polycystic disease, ovaries   . Sickle cell trait [facility])    Past Surgical History:  Past Surgical History:  Procedure Laterality Date  . LAPAROSCOPIC CHOLECYSTECTOMY      Assessment & Plan Clinical Impression: Sharon Bartram Richardsonis a 41 y.o. left handed female with reported history of SSD, PCOS who was admitted on 02/25/17 with RLE weakness and numbness. History taken from chart review and patient. Blood pressures elevated requiring IV labetalol. 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  CTA head/neck done revealing widespread moderate severe stenosis , Bilateral PCA, left MCA, left ACA and 18 mm left thyroid nodule--non-emergent follow up recommended.  HIV and RPR negative. 2 D echo with EF 60-65% with no wall abnormality.  Dr. Zigmund Daniel  consulted for input on questionable SS vasculopathy and Hb electrophoresis ordered for work up as blood smear without large burden of sickle cells. She was treated for SS crises and no indication for transfusion.   Patient without documentation of SSD and electrophoresis consistent with sickle cell trait and question of JRA.  Coagulopathy panel ordered today for work up. Anemia panel negative. She reported decrease in vision with waxing and waning of vision therefore opthalmology consulted for input.  Exam by Dr. Prudencio Burly was negative for acute findings and patient to follow up on outpatient basis.  Dr. Leonie Man feels that stroke due small vessel disease v/so vasculopathy and patient on ASA for secondary stroke prevention.   Patient feels like her vision problem has been improving. Still notes numbness on the right side of her face and right side of her body.  Patient currently requires mod with basic self-care skills secondary to muscle weakness, decreased cardiorespiratoy endurance, unbalanced muscle activation and decreased coordination, decreased visual acuity and field cut, decreased memory and decreased standing balance and hemiplegia.  Prior to hospitalization, patient could complete BADLs with independent .  Patient will benefit from skilled intervention to increase independence with basic self-care skills prior to discharge home with care partner.  Anticipate patient will require intermittent supervision and follow up outpatient.  OT - End of Session Endurance Deficit: Yes OT Assessment Rehab Potential (ACUTE ONLY): Good Barriers to Discharge:  (N/A) OT Patient demonstrates impairments in the following area(s): Balance;Safety;Cognition;Endurance;Motor;Sensory;Vision OT Basic ADL's Functional Problem(s): Grooming;Bathing;Dressing;Toileting OT Advanced ADL's Functional Problem(s): Simple Meal Preparation;Laundry OT Transfers Functional Problem(s):  Toilet;Tub/Shower OT Additional Impairment(s):  Fuctional Use of Upper Extremity OT Plan OT Intensity: Minimum of 1-2 x/day, 45 to 90 minutes OT Frequency: 5 out of 7 days OT Duration/Estimated Length of Stay: 7-10 days OT Treatment/Interventions: Discharge planning;Therapeutic Activities;Self Care/advanced ADL retraining;UE/LE Coordination activities;Cognitive remediation/compensation;Functional mobility training;Patient/family education;Therapeutic Exercise;DME/adaptive equipment instruction;Psychosocial support;Neuromuscular re-education OT Self Feeding Anticipated Outcome(s): N/A OT Basic Self-Care Anticipated Outcome(s): Mod I  OT Toileting Anticipated Outcome(s): Mod I  OT Bathroom Transfers Anticipated Outcome(s): Mod I  OT Recommendation Recommendations for Other Services: Speech consult;Therapeutic Recreation consult Therapeutic Recreation Interventions: Pet therapy;Kitchen group Patient destination: Home Follow Up Recommendations: Outpatient OT Equipment Recommended: Tub/shower bench;To be determined   Skilled Therapeutic Intervention Skilled OT session completed with focus on initial evaluation, education on OT role/POC, R UE NMR, and establishment of patient-centered goals. Pt was sitting in w/c at time of arrival, agreeable to tx. No c/o pain. Pt ambulated with RW to bathroom for shower. Pt able to use R UE for washing left arm and right leg.  Figure 4 position utilized for washing bilateral LEs. Right hand able to maintain grasp when weightbearing on grab bar when standing. Pt also incorporated R UE during pericare though required assist for thoroughness. Afterwards she completed dressing with overall Min A for balance. Presents with right sensory, fine motor, and proprioceptive deficits. Discussed how these affect ADL/IADL performance. At end of session pt was returned to bed. She pulled herself upwards with bilateral UEs. OT assisted with repositioning in order to protect hemiplegic side. She was left with all needs within reach  at time of departure.   OT Evaluation Precautions/Restrictions  Precautions Precautions: Fall Restrictions Weight Bearing Restrictions: No Other Position/Activity Restrictions: R-sided weakness General Chart Reviewed: Yes Vital Signs    Home Living/Prior Functioning Home Living Family/patient expects to be discharged to:: Private residence Living Arrangements: Alone Available Help at Discharge: Family, Available PRN/intermittently (Mother still works) Type of Home: Apartment Home Access: Level entry Home Layout: One level Bathroom Shower/Tub: Public librarian, Architectural technologist: Programmer, systems: Yes  Lives With: Alone IADL History Homemaking Responsibilities: Yes Meal Prep Responsibility: Primary Laundry Responsibility: Primary Cleaning Responsibility: Primary Bill Paying/Finance Responsibility: Primary Shopping Responsibility: Primary Child Care Responsibility: No Mode of Transportation: Car Education: high school Occupation: Psychologist, 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  Leisure and Hobbies: Reading, attending church  Prior Function Level of Independence: Independent with gait, Independent with transfers, Independent with basic ADLs Driving: Yes 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: Volunteers ADL ADL ADL Comments: Please see functional navigator for ADL status Vision/Perception  Vision- Assessment Eye Alignment: Within Functional Limits Alignment/Gaze Preference: Within Defined Limits Tracking/Visual Pursuits: Decreased smoothness of horizontal tracking;Decreased smoothness of eye movement to LEFT inferior field;Decreased smoothness of eye movement to LEFT superior field;Decreased smoothness of vertical tracking;Decreased smoothness of eye movement to RIGHT superior field;Decreased smoothness of eye movement to RIGHT inferior field Additional  Comments:  (convergence and R periphery deficits) Perception Perception: Impaired Comments:  (mild right neglect during bathing/dressing) Praxis Praxis: Intact  Cognition Overall Cognitive Status: Impaired/Different from baseline Arousal/Alertness: Awake/alert Orientation Level: Person;Place;Situation Person: Oriented Place: Oriented Situation: Oriented Year: 2018 Month: April Day of Week: Incorrect Memory: Impaired Memory Impairment: Decreased recall of new information Immediate Memory Recall: Blue;Sock;Bed Memory Recall: Blue;Bed Memory Recall Blue: Without Cue Memory Recall Bed: With Cue Attention: Sustained Sustained Attention: Appears intact Selective Attention: Appears intact Awareness: Appears intact Problem Solving:  Appears intact Safety/Judgment: Appears intact Sensation Sensation Light Touch: Impaired Detail Light Touch Impaired Details: Impaired RUE;Impaired RLE (Pt often reporting that she felt left hand was being touched when therapist was assessing right hand. Impaired overall, absent in some areas near elbow) Stereognosis: Not tested Hot/Cold: Appears Intact Proprioception: Impaired by gross assessment Proprioception Impaired Details: Impaired RUE Coordination Fine Motor Movements are Fluid and Coordinated: No Finger Nose Finger Test: Dysmetria R UE Motor  Motor Motor: Hemiplegia Motor - Skilled Clinical Observations:  (R sided weakness ) Mobility  Transfers Transfers: Sit to Stand;Stand to Sit Sit to Stand: 3: Mod assist Stand to Sit: 3: Mod assist  Trunk/Postural Assessment  Cervical Assessment Cervical Assessment: Within Functional Limits Thoracic Assessment Thoracic Assessment: Within Functional Limits Lumbar Assessment Lumbar Assessment: Within Functional Limits Postural Control Postural Control: Within Functional Limits  Balance Balance Balance Assessed: Yes Dynamic Sitting Balance Dynamic Sitting - Level of Assistance: 5: Stand by  assistance Sitting balance - Comments:  (while bathing on tub bench and sitting EOB for dressing) Dynamic Standing Balance Dynamic Standing - Level of Assistance: 4: Min assist Dynamic Standing - Comments:  (during pericare completion in shower) Extremity/Trunk Assessment RUE Assessment RUE Assessment: Exceptions to [facility] (3-/5 proximal to distal excluding internal rotation (2+/5)) LUE Assessment LUE Assessment: Exceptions to [facility] (3+/5 proximal to distal )   See Function Navigator for Current Functional Status.   Refer to Care Plan for Long Term Goals  Recommendations for other services: Therapeutic Recreation  Pet therapy, Kitchen group and Outing/community reintegration and Other: SLP   Discharge Criteria: Patient will be discharged from OT if patient refuses treatment 3 consecutive times without medical reason, if treatment goals not met, if there is a change in medical status, if patient makes no progress towards goals or if patient is discharged from hospital.  The above assessment, treatment plan, treatment alternatives and goals were discussed and mutually agreed upon: by patient  Skeet Simmer 03/03/2017, 6:35 PM

## 2017-03-03 NOTE — Progress Notes (Signed)
Subjective/Complaints:  Awakened last noc with severe headache left side of head, no new weakness noted, I reviewed rapid response notes .  BP was elevated at the time  CT head no new infarct or bleed Labs pending  ROS-  Denies CP,SOB, -N/V/D, no pain with urination Objective: Vital Signs: Blood pressure 107/73, pulse 72, temperature 98 F (36.7 C), temperature source Oral, resp. rate 16, SpO2 97 %. Ct Head Wo Contrast  Result Date: 03/03/2017 CLINICAL DATA:  Mental status change. Numbness and tingling of the right side of the face. EXAM: CT HEAD WITHOUT CONTRAST TECHNIQUE: Contiguous axial images were obtained from the base of the skull through the vertex without intravenous contrast. COMPARISON:  02/25/2017 FINDINGS: Brain: Low-attenuation changes in the deep white matter consistent with small vessel ischemic change. Multiple areas of focal encephalomalacia in the deep white matter, thalamus, and basal ganglia consistent with old lacunar infarcts. No definite acute changes although MRI would be more sensitive for evaluation of acute stroke. No mass effect or midline shift. No abnormal extra-axial fluid collections. Gray-white matter junctions are distinct. Basal cisterns are not effaced. Ventricles are not dilated. No acute intracranial hemorrhage. Vascular: No hyperdense vessel or unexpected calcification. Skull: Normal. Negative for fracture or focal lesion. Sinuses/Orbits: No acute finding. Other: None. IMPRESSION: No acute intracranial abnormalities. Multiple old lacunar infarcts in the deep white matter. Electronically Signed   By: Burman Nieves M.D.   On: 03/03/2017 01:36   Results for orders placed or performed during the hospital encounter of 02/25/17 (from the past 72 hour(s))  ANA w/Reflex if Positive     Status: None   Collection Time: 02/28/17  2:24 PM  Result Value Ref Range   Anit Nuclear Antibody(ANA) Negative Negative    Comment: (NOTE) Performed At: [facility] [address] [place], [place] [id number] Mila Homer MD WU:[id number]   Angiotensin converting enzyme     Status: None   Collection Time: 02/28/17  2:24 PM  Result Value Ref Range   Angiotensin-Converting Enzyme 34 14 - 82 U/L    Comment: (NOTE) Performed At: [facility] [address] [place], [place] [id number] Mila Homer MD VH:[id number]   Sedimentation rate     Status: Abnormal   Collection Time: 02/28/17  2:24 PM  Result Value Ref Range   Sed Rate 37 (H) 0 - 22 mm/hr  B. burgdorfi antibodies     Status: None   Collection Time: 02/28/17  2:24 PM  Result Value Ref Range   B burgdorferi Ab IgG+IgM <0.91 0.00 - 0.90 ISR    Comment: (NOTE)                                Negative         <0.91                                Equivocal  0.91 - 1.09                                Positive         >1.09 Performed At: [facility] [address] [place], [place] [id number] Mila Homer MD UV:[id number]   RPR     Status: None   Collection Time: 02/28/17  2:24 PM  Result  Value Ref Range   RPR Ser Ql Non Reactive Non Reactive    Comment: (NOTE) Performed At: [facility] [address] [place], [place] [id number] Mila Homer MD WG:[id number]   HIV antibody     Status: None   Collection Time: 02/28/17  2:24 PM  Result Value Ref Range   HIV Screen 4th Generation wRfx Non Reactive Non Reactive    Comment: (NOTE) Performed At: [facility] [address] [place], [place] [id number] Mila Homer MD MW:[id number]   CBC with Differential/Platelet     Status: Abnormal   Collection Time: 03/01/17  5:12 AM  Result Value Ref Range   WBC 8.7 4.0 - 10.5 K/uL   RBC 4.72 3.87 - 5.11 MIL/uL   Hemoglobin 11.8 (L) 12.0 - 15.0 g/dL   HCT 72.5 (L) 36.6 - 44.0 %   MCV 75.6 (L) 78.0 - 100.0 fL   MCH 25.0 (L) 26.0 - 34.0 pg   MCHC 33.1 30.0 - 36.0 g/dL   RDW 34.7 42.5 - 95.6 %   Platelets 285 150 - 400 K/uL   Neutrophils  Relative % 44 %   Neutro Abs 3.8 1.7 - 7.7 K/uL   Lymphocytes Relative 45 %   Lymphs Abs 3.9 0.7 - 4.0 K/uL   Monocytes Relative 7 %   Monocytes Absolute 0.6 0.1 - 1.0 K/uL   Eosinophils Relative 4 %   Eosinophils Absolute 0.4 0.0 - 0.7 K/uL   Basophils Relative 0 %   Basophils Absolute 0.0 0.0 - 0.1 K/uL  Reticulocytes     Status: None   Collection Time: 03/01/17  5:12 AM  Result Value Ref Range   Retic Ct Pct 1.1 0.4 - 3.1 %   RBC. 4.72 3.87 - 5.11 MIL/uL   Retic Count, Manual 51.9 19.0 - 186.0 K/uL  Iron and TIBC     Status: None   Collection Time: 03/01/17  9:05 PM  Result Value Ref Range   Iron 59 28 - 170 ug/dL   TIBC 387 564 - 332 ug/dL   Saturation Ratios 20 10.4 - 31.8 %   UIBC 232 ug/dL  Ferritin     Status: None   Collection Time: 03/01/17  9:05 PM  Result Value Ref Range   Ferritin 47 11 - 307 ng/mL  Antithrombin III     Status: None   Collection Time: 03/02/17 12:24 PM  Result Value Ref Range   AntiThromb III Func 97 75 - 120 %     HEENT: no facial assymetry, eyes non injected, no ptosis Cardio: RRR and no murmur or ES Resp: CTA B/L and unlabored GI: BS positive and NT, ND Extremity:  BS positive and NT, ND Skin:   Intact Neuro: Alert/Oriented, Cranial Nerve II-XII normal, Abnormal Sensory reduced LT sensation on right side face V1,2,3, reduced RUE and RLE, Abnormal Motor 4/5 in RUE and RLE, 5/5 on left and Abnormal [facility] Ataxic/ dec [facility] Musc/Skel:  Other no pain with LUE or LLE ROM Gen NAD   Assessment/Plan: 1. Functional deficits secondary to Left thalamic infarct causing right hemiparesis and sensory deficits which require 3+ hours per day of interdisciplinary therapy in a comprehensive inpatient rehab setting. Physiatrist is providing close team supervision and 24 hour management of active medical problems listed below. Physiatrist and rehab team continue to assess barriers to discharge/monitor patient progress toward functional and medical goals. FIM:  Function - Comprehension Comprehension: Auditory Comprehension assist level: Follows basic conversation/direction with no assist  Function - Expression Expression: Verbal Expression assist level: Expresses basic needs/ideas: With extra time/assistive device  Function - Social Interaction Social Interaction assist level: Interacts appropriately with others with medication or extra time (anti-anxiety, antidepressant).  Function - Problem Solving Problem solving assist level: Solves basic 90% of the time/requires cueing < 10% of the time  Function - Memory Memory assist level: Recognizes or recalls 90% of the time/requires cueing < 10% of the time  Medical Problem List and Plan: 1.  Gait abnormality, limitations with self-care secondary to left thalamus infarct. Complaints from yesterday pm were mainly HA, no new neuro defs Start CIR PT, OT 2.  DVT Prophylaxis/Anticoagulation: Pharmaceutical: Lovenox 3. Pain Management: Will change oxycodone to ultram prn for joint pain/aches.  Add topamax for HA 4. Mood: team to provide ego support to help with adjustment reaction. LCSW to follow for evaluation and support.  5. Neuropsych: This patient is capable of making decisions on her own behalf. 6. Skin/Wound Care: routine pressure relief measures. 7. Fluids/Electrolytes/Nutrition: Monitor I/O. Check lytes in am. Offer supplements between meals as reports poor intake.  8. HTN: Permissive HTN with gradual normalization in next 1-2 days. Will add medication as indicated.  9. Dyslipidemia: On Lipitor.  10.Sickle Cell trait: Iron studies WNL. She does not have primary MD--will need PMD at discharge per Dr. Ashley Royalty.  Hematology and Rheumatology consults recommended for work up also.  11. Thyroid nodule: Follow up with primary MD for workup. TSH WNL LOS (Days) 1 A FACE TO FACE EVALUATION WAS PERFORMED  Sharon Chan E 03/03/2017, 6:36 AM

## 2017-03-03 NOTE — Care Management Note (Signed)
[facility] Individual Statement of Services  Patient Name:  Sharon Chan  Date:  03/03/2017  Welcome to the [facility].  Our goal is to provide you with an individualized program based on your diagnosis and situation, designed to meet your specific needs.  With this comprehensive rehabilitation program, you will be expected to participate in at least 3 hours of rehabilitation therapies Monday-Friday, with modified therapy programming on the weekends.  Your rehabilitation program will include the following services:  Physical Therapy (PT), Occupational Therapy (OT), 24 hour per day rehabilitation nursing, Therapeutic Recreaction (TR), Neuropsychology, Case Management (Social Worker), Rehabilitation Medicine, Nutrition Services and Pharmacy Services  Weekly team conferences will be held on Wednesday to discuss your progress.  Your Social Worker will talk with you frequently to get your input and to update you on team discussions.  Team conferences with you and your family in attendance may also be held.  Expected length of stay: 7-10 days Overall anticipated outcome: mod/I level  Depending on your progress and recovery, your program may change. Your Social Worker will coordinate services and will keep you informed of any changes. Your Social Worker's name and contact numbers are listed  below.  The following services may also be recommended but are not provided by the Inpatient [facility]:   Driving Evaluations  Home Health Rehabiltiation Services  Outpatient Rehabilitation Services    Arrangements will be made to provide these services after discharge if needed.  Arrangements include referral to agencies that provide these services.  Your insurance has been verified to be:  Medicare & medicaid Your primary doctor is:  [facility]  Pertinent information will be shared with your doctor and your insurance  company.  Social Worker:  Dossie Der, SW [phone] or [phone]  Information discussed with and copy given to patient by: Lucy Chris, 03/03/2017, 11:12 AM

## 2017-03-03 NOTE — Significant Event (Signed)
Rapid Response Event Note  Overview: Time Called: 0105 Arrival Time: 0107 Event Type: Neurologic  Initial Focused Assessment:  Called by Maxine Glenn, RN reference AMS and new onset facial numbness.  On arrival, pt is alert but confused with intermittent aphasia. Pupils 3 equal and reactive. Pt has baseline R sided weakness, L side responds to commands and weak but equal strength.  Bilateral facial numbness present.  Pt complaining of 7/10 headache.  Lungs CTA, VS as follows:  BP 155/110, HR 64 NSR, 98% RA, RR 16.  Bedside RN had spoken with pt's provider prior to my arrival and head CT was ordered STAT.   Pt was taken down to CT at approx 0115.  CT revealed no acute events and results were passed on to neurology on call Dr. Roseanne Reno.     Interventions: Head CT ordered by Jacalyn Lefevre, NP prior to my arrival.  Assisted with transfer to CT.  Notified Dr. Roseanne Reno of pt's symptoms and CT results.  CBC, BMP and urinalysis ordered by Dr. Roseanne Reno.    Plan of Care (if not transferred):  Pt to remain on , mental status appears to be improving mildly.  Advised bedside RN to call if any concerns or if pt condition deteriorates.    Event Summary: Name of Physician Notified: Roseanne Reno, MD at 0145    at    Outcome: Stayed in room and stabalized  Event End Time: 0150  Dan Maker

## 2017-03-03 NOTE — Progress Notes (Signed)
Social Work Assessment and Plan Social Work Assessment and Plan  Patient Details  Name: Sharon Chan MRN: [id number] Date of Birth: 1976-08-03  Today's Date: 03/03/2017  Problem List:  Patient Active Problem List   Diagnosis Date Noted  . 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 Sickle cell trait (HCC)   . Hb-SS disease with crisis (HCC)   . Hyperlipidemia   . Thyroid nodule   . Changes in vision   . Acute ischemic stroke (HCC)   . Dysphagia, post-stroke   . Benign essential HTN   . Polycystic kidney disease   . Prediabetes   . Nonintractable headache   . Leukocytosis   . Stroke (cerebrum) (HCC) 02/25/2017  . Stroke [facility]) 02/25/2017   Past Medical History:  Past Medical History:  Diagnosis Date  . Polycystic disease, ovaries   . Sickle cell trait [name])    Past Surgical History:  Past Surgical History:  Procedure Laterality Date  . LAPAROSCOPIC CHOLECYSTECTOMY     Social History:  reports that she has never smoked. She has never used smokeless tobacco. She reports that she does not drink alcohol. Her drug history is not on file.  Family / Support Systems Marital Status: Single Patient Roles: Parent, Other (Comment) (Friends) Other Supports: [name] [phone]  [name] [phone] Anticipated Caregiver: Patient and Mom to assist-currently had heel spur surgery and in a boot Ability/Limitations of Caregiver: Mom can not provide physical care Caregiver Availability: 24/7 Family Dynamics: Close knit family with Mom and sister, along with extended family. Her father and brother are deceased. 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   Abuse/Neglect Physical Abuse: Denies Verbal Abuse: Denies Sexual Abuse: Denies Exploitation of patient/patient's resources: Denies Self-Neglect: Denies  Emotional Status Pt's affect, behavior adn adjustment status: Pt is motivated to do well and recover from her stroke. She was concerned last night when a headache woke her up, she thought she was having another stroke. She hopes to recover and be independent again and back to her own apartment. Recent Psychosocial Issues: Other health issues-dealing with sickle cell, will have follow up in sickle cell clinic upon discharge Pyschiatric History: No history she appears to be coping appropriately at this time still adjusting to her diagnosis. This worker does feel she would benefit from seeing neuro-psych while here. Will make referral. Substance Abuse History: No issues  Patient / Family Perceptions, Expectations & Goals Pt/Family understanding of illness & functional limitations: Pt and Mom can explain her stroke and deficits. Both have seen progress and are encouraged by this. They have spoken with the MD and feel they have a good understanding of her condition and want to learn more about it. Premorbid pt/family roles/activities: Daughter, Friend, Network engineer, church member, etc Anticipated changes in roles/activities/participation: resume Pt/family expectations/goals: Pt states: " I want to be able to take care of myself before I go home." Mom states: " I will help her but she wants to be at her home."  [employer]: None Premorbid Home Care/DME Agencies: None Transportation available at discharge: Self or Mom will assist her with Resource referrals recommended: Neuropsychology, Support group (specify)  Discharge  Planning Living Arrangements: Alone Support Systems: Parent, Other relatives,  Friends/neighbors, Church/faith community Type of Residence: Private residence Insurance Resources: Medicare, [place] (specify county) Surveyor, quantity Resources: [employer] Screen Referred: No Living Expenses: Psychologist, sport and exercise Management: Patient Does the patient have any problems obtaining your medications?: No Home Management: patient Patient/Family Preliminary Plans: Return home or go to Mom's home if needed. It all depends upon how much care she is at discharge. Will work on a safe discharge plan and await team evaluations. Pt has good social supports. Social Work Anticipated Follow Up Needs: HH/OP, Support Group  Clinical Impression Pleasant motivated patient who is willing to work hard to accomplish her goals while here. Her Mom is supportive and involved and willing to assist. Will make neuro-psych referral due to would benefit from seeing for coping and support. Will await team's evaluations and work from there.  Lucy Chris 03/03/2017, 1:19 PM

## 2017-03-03 NOTE — Evaluation (Signed)
Physical Therapy Assessment and Plan  Patient Details  Name: SHALEEN TALAMANTEZ MRN: [id number] Date of Birth: 1976-06-03  PT Diagnosis: Abnormal posture, Abnormality of gait, Hemiparesis non-dominant and Impaired sensation Rehab Potential: Good ELOS: 7-10 days   Today's Date: 03/03/2017 PT Individual Time: 7829-5621 PT Individual Time Calculation (min): 80 min    Problem List:  Patient Active Problem List   Diagnosis Date Noted  . 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 Sickle cell trait ([place])   . Hb-SS disease with crisis ([place])   . Hyperlipidemia   . Thyroid nodule   . Changes in vision   . Acute ischemic stroke ([place])   . Dysphagia, post-stroke   . Benign essential HTN   . Polycystic kidney disease   . Prediabetes   . Nonintractable headache   . Leukocytosis   . Stroke (cerebrum) ([place]) 02/25/2017  . Stroke [facility]) 02/25/2017    Past Medical History:  Past Medical History:  Diagnosis Date  . Polycystic disease, ovaries   . Sickle cell trait [facility])    Past Surgical History:  Past Surgical History:  Procedure Laterality Date  . LAPAROSCOPIC CHOLECYSTECTOMY      Assessment & Plan Clinical Impression: Alyannah Sanks Richardsonis a 41 y.o. left handed female with reported history of SSD, PCOS who was admitted on 02/25/17 with RLE weakness and numbness. History taken from chart review and patient. Blood pressures elevated requiring IV labetalol. 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  CTA head/neck done revealing widespread moderate severe stenosis , Bilateral PCA, left MCA, left ACA and 18 mm left thyroid nodule--non-emergent follow up recommended.  HIV and RPR negative. 2 D echo with EF 60-65% with no wall abnormality.  Dr. Zigmund Daniel consulted for input on questionable SS vasculopathy and Hb  electrophoresis ordered for work up as blood smear without large burden of sickle cells. She was treated for SS crises and no indication for transfusion. Patient without documentation of SSD and electrophoresis consistent with sickle cell trait and question of JRA.  Coagulopathy panel ordered today for work up. Anemia panel negative. She reported decrease in vision with waxing and waning of vision therefore opthalmology consulted for input.  Exam by Dr. Prudencio Burly was negative for acute findings and patient to follow up on outpatient basis.  Dr. Leonie Man feels that stroke due small vessel disease v/so vasculopathy and patient on ASA for secondary stroke prevention.  Patient feels like her vision problem has been improving. Still notes numbness on the right side of her face and right side of her body. Patient transferred to CIR on 03/02/2017 .   Patient currently requires mod with mobility secondary to muscle weakness, decreased cardiorespiratoy endurance, impaired timing and sequencing, unbalanced muscle activation and decreased coordination, decreased visual acuity and decreased attention to right.  Prior to hospitalization, patient was independent  with mobility and lived with Alone in a [place] home.  Home access is  Level entry.  Patient will benefit from skilled PT intervention to maximize safe functional mobility, minimize fall risk and decrease caregiver burden for planned discharge home with intermittent assist.  Anticipate patient will benefit from follow up [facility] at discharge.  PT - End of Session Activity Tolerance: Tolerates < 10 min activity with changes in vital signs Endurance Deficit: Yes Endurance Deficit Description: Pt fatigued following 25' gait without device. BP elevation noted. RN  made aware.  PT Assessment Rehab Potential (ACUTE/IP ONLY): Good Barriers to Discharge: Decreased caregiver support PT Patient demonstrates impairments in the following area(s):  Balance;Edema;Endurance;Motor;Pain;Perception;Safety;Sensory PT Transfers Functional Problem(s): Bed Mobility;Bed to Chair;Car;Furniture PT Locomotion Functional Problem(s): Ambulation;Wheelchair Mobility;Stairs PT Plan PT Intensity: Minimum of 1-2 x/day ,45 to 90 minutes PT Frequency: 5 out of 7 days PT Duration Estimated Length of Stay: 7-10 days PT Treatment/Interventions: Ambulation/gait training;Balance/vestibular training;Community reintegration;Discharge planning;Disease management/prevention;DME/adaptive equipment instruction;Neuromuscular re-education;Functional mobility training;Pain management;Patient/family education;Psychosocial support;Splinting/orthotics;Stair training;Therapeutic Activities;Therapeutic Exercise;UE/LE Strength taining/ROM;UE/LE Coordination activities;Visual/perceptual remediation/compensation;Wheelchair propulsion/positioning PT Transfers Anticipated Outcome(s): Mod I  PT Locomotion Anticipated Outcome(s): Mod I in household, S in community PT Recommendation Recommendations for Other Services: Neuropsych consult;Therapeutic Recreation consult Therapeutic Recreation Interventions: Pet therapy;Kitchen group;Outing/community reintergration Follow Up Recommendations: Home health PT Patient destination: Home Equipment Recommended: Cane;Rolling walker with 5" wheels;To be determined Equipment Details: Maynot need AD at DC depending on progress  Skilled Therapeutic Intervention Pt tx initiated upon eval. Pt oriented to unit, call bell, PT POC, and principles of rehab and stroke recovery. Pt was educated on fall risk reduction and precautions. Pt instructed in transfer training for basic stand- pivot transfer, sit<>stands, sitting balance, standing balance training, stair training, and gait with RW x25'. Pt performed dressing at toilet at start of tx, needing Min A for steadying. See flowsheet for full report. Pt required up to Mod A to lift from various surfaces. She  demonstrates L inattention to her R side, especially the RUE during functional tasks. She does respond well to cues for force use.Her BP was elevated following exertion (141/102) thus RN made aware and modified tx for less exertion. Pt instructed in seated and supine NMR for increased motor control for hip/knee flexion/ext as well as heel slides and ankle pumps. Motor control is inconsistent, likely due to inattention at times.   PT Evaluation Precautions/Restrictions Precautions Precautions: Fall Restrictions Weight Bearing Restrictions: No Other Position/Activity Restrictions: R-sided weakness General   Vital SignsTherapy Vitals Pulse Rate: 65 BP: (!) 141/102 (Pam Love PAC aware. Monitor) Patient Position (if appropriate): Sitting Pain Pain Assessment Pain Assessment: 0-10 Pain Score: 8  Pain Type: Acute pain Pain Location: Head Pain Orientation: Right;Left;Anterior Pain Descriptors / Indicators: Aching Pain Onset: On-going Patients Stated Pain Goal: 1 Pain Intervention(s): RN made aware (Premedicated) Multiple Pain Sites: No Home Living/Prior Functioning Home Living Available Help at Discharge: Family;Available 24 hours/day Type of Home: Apartment Home Access: Level entry Home Layout: One level Bathroom Shower/Tub: Tub/shower unit;Curtain Biochemist, clinical: Standard Bathroom Accessibility: Yes  Lives With: Alone Prior Function Level of Independence: Independent with gait;Independent with transfers;Independent with basic ADLs Driving: Yes Vocation: On disability Comments: Volunteers Vision/Perception  Vision - History Baseline Vision: No visual deficits Patient Visual Report: Blurring of vision Vision - Assessment Eye Alignment: Within Functional Limits Ocular Range of Motion: Within Functional Limits Alignment/Gaze Preference: Within Defined Limits Additional Comments: Reports blurring in R field  Perception Perception: Within Functional Limits Comments:  Difficluty due to decreased sensation Praxis Praxis: Intact  Cognition Overall Cognitive Status: Within Functional Limits for tasks assessed Arousal/Alertness: Awake/alert Orientation Level: Oriented X4 Sustained Attention: Appears intact Selective Attention: Appears intact Memory: Appears intact Awareness: Appears intact Problem Solving: Appears intact Safety/Judgment: Appears intact Sensation Sensation Light Touch: Impaired Detail Light Touch Impaired Details: Impaired RUE;Impaired RLE Proprioception: Impaired Detail Proprioception Impaired Details: Impaired RUE;Impaired RLE Additional Comments: Light touch and deep pressure are inconsistently spotty throughout RUE/LE as well as locatlization Coordination Gross Motor Movements are Fluid and Coordinated: No Fine Motor  Movements are Fluid and Coordinated: No Coordination and Movement Description: Decreased accuracy and excursion with RUE/LE. But does well with reminders for improved function Heel Shin Test: Decreased excursion  Motor  Motor Motor: Hemiplegia Motor - Skilled Clinical Observations: Decreased motor control in RUE/LE, but anti-gravity motion present in all planes  Mobility Bed Mobility Bed Mobility: Supine to Sit;Sit to Supine Supine to Sit: 4: Min assist Supine to Sit Details: Tactile cues for weight shifting;Manual facilitation for weight shifting Sit to Supine: 4: Min assist Sit to Supine - Details (indicate cue type and reason): Min A for 1 LE Transfers Transfers: Yes Stand Pivot Transfers: 3: Mod assist Stand Pivot Transfer Details: Verbal cues for technique;Verbal cues for sequencing;Manual facilitation for weight shifting;Manual facilitation for placement Locomotion  Ambulation Ambulation: Yes Ambulation/Gait Assistance: 4: Min assist Ambulation Distance (Feet): 25 Feet Assistive device: None Ambulation/Gait Assistance Details: Manual facilitation for weight shifting;Manual facilitation for  placement;Verbal cues for precautions/safety Ambulation/Gait Assistance Details: Gait marked by decreased step length, foot clearance, and limited by fatigue Gait Gait velocity: decreased Stairs / Additional Locomotion Stairs: Yes Stairs Assistance: 4: Min assist Stairs Assistance Details: Tactile cues for initiation;Manual facilitation for weight shifting Stairs Assistance Details (indicate cue type and reason): Cues for sequence and safety Stair Management Technique: Two rails;Step to pattern;Forwards Number of Stairs: 8 Height of Stairs: 4 Architect: Yes Wheelchair Assistance: 5: Investment banker, operational Details: Verbal cues for sequencing;Verbal cues for technique;Verbal cues for safe use of DME/AE Wheelchair Propulsion: Both upper extremities;Both lower extermities Wheelchair Parts Management: Needs assistance Distance: 120  Trunk/Postural Assessment  Cervical Assessment Cervical Assessment: Within Functional Limits Thoracic Assessment Thoracic Assessment: Within Functional Limits Lumbar Assessment Lumbar Assessment: Within Functional Limits Postural Control Postural Control: Deficits on evaluation Postural Limitations: Pt leans L consistently during transitional movements and   Balance Balance Balance Assessed: Yes Dynamic Sitting Balance Dynamic Sitting - Balance Support: Left upper extremity supported;Feet supported Dynamic Sitting - Level of Assistance: 5: Stand by assistance Dynamic Sitting Balance - Compensations: UE use  Static Standing Balance Static Standing - Balance Support: Right upper extremity supported;Left upper extremity supported Static Standing - Level of Assistance: 4: Min assist Dynamic Standing Balance Dynamic Standing - Balance Support: Right upper extremity supported;Left upper extremity supported Dynamic Standing - Level of Assistance: 4: Min assist Dynamic Standing - Balance Activities: Lateral lean/weight  shifting;Forward lean/weight shifting;Reaching for objects;Reaching across midline Extremity Assessment  RUE Assessment RUE Assessment: Exceptions to [facility] RUE Strength RUE Overall Strength Comments: Grossly 2+ throughout LUE Assessment LUE Assessment: Within Functional Limits RLE Assessment RLE Assessment: Exceptions to [facility] RLE Strength RLE Overall Strength Comments: Grossly 2+ throughout, but inconsistency noted during functional tasks. Pt does well when reminded to use R extremities. Old R ankel fx and fixation LLE Assessment LLE Assessment: Within Functional Limits   See Function Navigator for Current Functional Status.   Refer to Care Plan for Long Term Goals  Recommendations for other services: Neuropsych and Therapeutic Recreation  Pet therapy, Kitchen group, Stress management and Outing/community reintegration  Discharge Criteria: Patient will be discharged from PT if patient refuses treatment 3 consecutive times without medical reason, if treatment goals not met, if there is a change in medical status, if patient makes no progress towards goals or if patient is discharged from hospital.  The above assessment, treatment plan, treatment alternatives and goals were discussed and mutually agreed upon: by patient and by family  Breuna Loveall, Corinna Lines, PT, DPT  03/03/2017,  12:51 PM

## 2017-03-04 ENCOUNTER — Inpatient Hospital Stay (HOSPITAL_COMMUNITY): Payer: Medicare Other | Admitting: Speech Pathology

## 2017-03-04 ENCOUNTER — Inpatient Hospital Stay (HOSPITAL_COMMUNITY): Payer: Medicare Other | Admitting: Physical Therapy

## 2017-03-04 ENCOUNTER — Inpatient Hospital Stay (HOSPITAL_COMMUNITY): Payer: Medicare Other | Admitting: Occupational Therapy

## 2017-03-04 DIAGNOSIS — G44319 Acute post-traumatic headache, not intractable: Secondary | ICD-10-CM

## 2017-03-04 DIAGNOSIS — I63411 Cerebral infarction due to embolism of right middle cerebral artery: Secondary | ICD-10-CM

## 2017-03-04 DIAGNOSIS — E785 Hyperlipidemia, unspecified: Secondary | ICD-10-CM

## 2017-03-04 DIAGNOSIS — I10 Essential (primary) hypertension: Secondary | ICD-10-CM

## 2017-03-04 LAB — BETA-2-GLYCOPROTEIN I ABS, IGG/M/A
Beta-2 Glyco I IgG: <9 GPI IgG units (ref 0–20)
Beta-2-Glycoprotein I IgM: <9 GPI IgM units (ref 0–32)

## 2017-03-04 NOTE — Progress Notes (Signed)
Speech Language Pathology Daily Session Note  Patient Details  Name: Sharon Chan MRN: [id number] Date of Birth: [birth year]  Today's Date: 03/04/2017 SLP Individual Time: 2130-8657 SLP Individual Time Calculation (min): 43 min  Short Term Goals: Week 1: SLP Short Term Goal 1 (Week 1): Pt will utilize word finding strategies at the unstructured simple conversation level with supervision cues.  SLP Short Term Goal 2 (Week 1): Pt will self-monitor and self-correct expressive communication with supervision cues.   Skilled Therapeutic Interventions: Skilled treatment session focused on language goals. SLP educated patient in regards to word-finding strategies in which the patient used in a verbal description task with supervision verbal cues needed for specificity. Patient self-monitored and corrected expressive communication during a spontaneous conversation with Mod I. Patient left upright in chair with all needs within reach. Continue with current plan of care.      Function:   Cognition Comprehension Comprehension assist level: Follows complex conversation/direction with extra time/assistive device  Expression   Expression assist level: Expresses complex ideas: With extra time/assistive device  Social Interaction Social Interaction assist level: Interacts appropriately with others with medication or extra time (anti-anxiety, antidepressant).  Problem Solving Problem solving assist level: Solves complex problems: Recognizes & self-corrects  Memory Memory assist level: Recognizes or recalls 90% of the time/requires cueing < 10% of the time    Pain No/Denies Pain   Therapy/Group: Individual Therapy  [name] 03/04/2017, 12:54 PM

## 2017-03-04 NOTE — Progress Notes (Signed)
Sharon Chan is a 41 y.o. female [birth year] [id number]  Subjective: Continue HA and intermittent "floaters" in L upper visual field - unchanged since yesterday - no other new problems, pain or weakness. Slept well. Feeling well.  Objective: Vital signs in last 24 hours: Temp:  [97.6 F (36.4 C)] 97.6 F (36.4 C) (04/21 0602) Pulse Rate:  [63-76] 63 (04/21 0602) Resp:  [18] 18 (04/21 0602) BP: (137-150)/(85-102) 137/85 (04/21 0602) SpO2:  [98 %] 98 % (04/21 0602) Weight change:  Last BM Date: 02/28/17  Intake/Output from previous day: 04/20 0701 - 04/21 0700 In: 240 [P.O.:240] Out: -   Physical Exam General: No apparent distress   In BSC recline, pleasant and interactive Lungs: Normal effort. Lungs clear to auscultation, no crackles or wheezes. Cardiovascular: Regular rate and rhythm, no edema Neurological: No new neurological deficits - R HP UE>LE   Lab Results: BMET    Component Value Date/Time   NA 138 03/03/2017 0811   K 3.5 03/03/2017 0811   CL 105 03/03/2017 0811   CO2 25 03/03/2017 0811   GLUCOSE 120 (H) 03/03/2017 0811   BUN 13 03/03/2017 0811   CREATININE 0.83 03/03/2017 0811   CALCIUM 8.9 03/03/2017 0811   GFRNONAA >60 03/03/2017 0811   GFRAA >60 03/03/2017 0811   CBC    Component Value Date/Time   WBC 7.7 03/03/2017 0811   RBC 4.90 03/03/2017 0811   HGB 12.6 03/03/2017 0811   HCT 36.5 03/03/2017 0811   PLT 264 03/03/2017 0811   MCV 74.5 (L) 03/03/2017 0811   MCH 25.7 (L) 03/03/2017 0811   MCHC 34.5 03/03/2017 0811   RDW 15.3 03/03/2017 0811   LYMPHSABS 3.2 03/03/2017 0811   MONOABS 0.5 03/03/2017 0811   EOSABS 0.4 03/03/2017 0811   BASOSABS 0.0 03/03/2017 0811   CBG's (last 3):  No results for input(s): GLUCAP in the last 72 hours. LFT's Lab Results  Component Value Date   ALT 23 03/03/2017   AST 22 03/03/2017   ALKPHOS 63 03/03/2017   BILITOT 0.4 03/03/2017    Studies/Results: Ct Head Wo Contrast  Result Date:  03/03/2017 CLINICAL DATA:  Mental status change. Numbness and tingling of the right side of the face. EXAM: CT HEAD WITHOUT CONTRAST TECHNIQUE: Contiguous axial images were obtained from the base of the skull through the vertex without intravenous contrast. COMPARISON:  02/25/2017 FINDINGS: Brain: Low-attenuation changes in the deep white matter consistent with small vessel ischemic change. Multiple areas of focal encephalomalacia in the deep white matter, thalamus, and basal ganglia consistent with old lacunar infarcts. No definite acute changes although MRI would be more sensitive for evaluation of acute stroke. No mass effect or midline shift. No abnormal extra-axial fluid collections. Gray-white matter junctions are distinct. Basal cisterns are not effaced. Ventricles are not dilated. No acute intracranial hemorrhage. Vascular: No hyperdense vessel or unexpected calcification. Skull: Normal. Negative for fracture or focal lesion. Sinuses/Orbits: No acute finding. Other: None. IMPRESSION: No acute intracranial abnormalities. Multiple old lacunar infarcts in the deep white matter. Electronically Signed   By: Burman Nieves M.D.   On: 03/03/2017 01:36    Medications:  I have reviewed the patient's current medications. Scheduled Medications: . aspirin EC  325 mg Oral Daily  . atorvastatin  40 mg Oral q1800  . enoxaparin (LOVENOX) injection  40 mg Subcutaneous QHS  . feeding supplement  1 Container Oral TID BM  . naphazoline-glycerin  1-2 drop Both Eyes TID WC & HS  . topiramate  25 mg Oral Daily  . 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 L thalamic embolic CVA with residual mild R HP w/ sensory deficits, HA, visual changes and subsequent debility - continue CIR as ongoing with med mgmt as rx'd 2. HA pain d/t above - no new neuro deficits - continue Topamax 3. HTN - permissive due to above - monitor and resume tx as indicated 4 hyperlipidemia - on statin s/p CVA 5. SS trait - OP plans for f/u reviewed in additional to w/u for possible Heme/Rheum issue  Length of stay, days: 2   Valerie A. Felicity Coyer, MD 03/04/2017, 11:53 AM

## 2017-03-04 NOTE — Progress Notes (Signed)
Physical Therapy Session Note  Patient Details  Name: Sharon Chan MRN: [id number] Date of Birth: 1976-02-24  Today's Date: 03/04/2017 PT Individual Time: 0800-0900 PT Individual Time Calculation (min): 60 min   Short Term Goals: Week 1:  PT Short Term Goal 1 (Week 1): STG=LTG due to LOS, Mod I overall for transfers and household gait  Skilled Therapeutic Interventions/Progress Updates: Pt received supine in bed, c/o headache as below and agreeable to treatment. Supine>sit with S and HOB elevated. Gait to/from bathroom with RW and close S, slow speed. Pt performed clothing management and hygiene with S and setup for retrieving washcloth and clean underwear. Standing at sink pt performs hand hygiene with S. Gait to gym x125' with RW and S; decreased weight bearing through RLE. Standing balance with BUE support LLE toe taps to 3" step, progression to no UE support with min guard, 6" step no UE support, 6" step with up to 10 number combinations in a row with cognitive task of recall of number sequence. Lateral step ups 2 sets each LE; x17-28 reps LLE before fatigued, x12-14 reps RLE before fatigued. Gait x125' with RW and S; gait on ramp and uneven mulch surface with RW and S. Gait to room with no AD and min guard x75'. Remained seated in recliner at end of session, all needs in reach.    Therapy Documentation Precautions:  Precautions Precautions: Fall Restrictions Weight Bearing Restrictions: No Other Position/Activity Restrictions: R-sided weakness Pain: Pain Assessment Pain Assessment: 0-10 Pain Score: 9 Pain Location: Head Pain Orientation: Anterior Pain Descriptors / Indicators: Aching Pain Frequency: Intermittent Patients Stated Pain Goal: 2 Pain Intervention(s): pre-medicated   See Function Navigator for Current Functional Status.   Therapy/Group: Individual Therapy  Vista Lawman 03/04/2017, 8:50 AM

## 2017-03-04 NOTE — Progress Notes (Signed)
Occupational Therapy Session Note  Patient Details  Name: Sharon Chan MRN: [id number] Date of Birth: [birth year]  Today's Date: 03/04/2017 OT Individual Time: 1001-1103 OT Individual Time Calculation (min): 62 min    Short Term Goals: Week 1:  OT Short Term Goal 1 (Week 1): STGs=LTGs due to ELOS  Skilled Therapeutic Interventions/Progress Updates: Skilled OT session completed with focus on adaptive bathing/dressing skills, R UE NMR, dynamic standing, and d/c planning. Pt was sitting on couch at time of arrival, agreeable to shower in tub room. Pt gathered ADL items with Min A with RW, scanning drawers and retrieving items with R UE. Pt was then escorted in w/c to tub room and completed tub shower transfer with Min A and RW. Bathing completed with overall Min A, pt still lacking functional grasp with R UE but exhibits increased strength proximally for washing. Sit<stand with Min A, assist for thoroughness with pericare. Pt then ambulated short distance (Min A without AD) to w/c to dress. Mod cues for carryover of hemi techniques. Pt requires extra time but very motivated to incorporate R UE. At end of session she was escorted back to room. She completed oral care w/c level for energy conservation. Discussed DME needs for home which include tub bench and BSC to elevate toilet height. At end of session pt transferred back to couch and was left with all needs within reach.   Pt reported "floaters" in L and R eyes during tx. Still blurry vision also.     2nd Session 1:1 tx (44 min) Pt was lying in bed at time of arrival, agreeable to tx. Tx focus on R UE NMR. She self propelled to dayroom for R UE strengthening. Once in dayroom, she was provided with yellow theraputty and foam block HEPs. She was instructed on use and followed handout instructions with min vcs for remembering which step she was on. Afterwards she self propelled back to room. She reported needing to void. She ambulated into  bathroom without AD (Min A) for toilet transfer. Toileting completed with Min guard after voiding bladder, and then she ambulated back to bed. She was left with all needs within reach at time of departure.   She still reports blurry vision/floaters.   Therapy Documentation Precautions:  Precautions Precautions: Fall Restrictions Weight Bearing Restrictions: No Other Position/Activity Restrictions: R-sided weakness   Pain: No c/o pain during sessions    ADL: ADL ADL Comments: Please see functional navigator for ADL status       See Function Navigator for Current Functional Status.   Therapy/Group: Individual Therapy  Mukhtar Shams A Elio Haden 03/04/2017, 12:47 PM

## 2017-03-05 NOTE — Progress Notes (Signed)
Pt tends to neglect the RUE, encouraged pt to accept objects using the RUE. She has been doing well with that throughout shift. Pt noted pain to be from the temporal to the occipital and neck area. Patient tried consuming caffeine to assist with relieving pain without being medicated. After a hour of consumption of coke, she tried ice cream to assist with decreasing the headache. Patient was administered prn regimen for pain med, pain decrease but continues to be there. Pt denies depression. Patient shares concerns of having anxiety at times fear of having another stroke, if possible she would like "something that is mild to help with anxiety, as needed not all the time." She also shares concerns of right eye not being able to "see clear at times." Continues to experience facial numbness, denies swallowing complications. Pt notes she is in the process of seeking another PCP once discharge.

## 2017-03-05 NOTE — Progress Notes (Signed)
Sharon Chan is a 41 y.o. female 06-22-1976 [id number]  Subjective: No change in L side HA and L visual field intermittent blurring/spots. Feels strength on R is improving and generally optimistic  Objective: Vital signs in last 24 hours: Temp:  [98 F (36.7 C)-98.8 F (37.1 C)] 98 F (36.7 C) (04/22 0500) Pulse Rate:  [78-86] 78 (04/22 0500) Resp:  [18] 18 (04/22 0500) BP: (125-146)/(96-107) 125/107 (04/22 0500) SpO2:  [96 %-98 %] 96 % (04/22 0500) Weight change:  Last BM Date: 03/04/17  Intake/Output from previous day: 04/21 0701 - 04/22 0700 In: 480 [P.O.:480] Out: -   Physical Exam General: No apparent distress    pleasant and interactive Lungs: Normal effort. Lungs clear to auscultation, no crackles or wheezes. Cardiovascular: Regular rate and rhythm, no edema Neurological: No new neurological deficits - R HP UE>LE   Lab Results: BMET    Component Value Date/Time   NA 138 03/03/2017 0811   K 3.5 03/03/2017 0811   CL 105 03/03/2017 0811   CO2 25 03/03/2017 0811   GLUCOSE 120 (H) 03/03/2017 0811   BUN 13 03/03/2017 0811   CREATININE 0.83 03/03/2017 0811   CALCIUM 8.9 03/03/2017 0811   GFRNONAA >60 03/03/2017 0811   GFRAA >60 03/03/2017 0811   CBC    Component Value Date/Time   WBC 7.7 03/03/2017 0811   RBC 4.90 03/03/2017 0811   HGB 12.6 03/03/2017 0811   HCT 36.5 03/03/2017 0811   PLT 264 03/03/2017 0811   MCV 74.5 (L) 03/03/2017 0811   MCH 25.7 (L) 03/03/2017 0811   MCHC 34.5 03/03/2017 0811   RDW 15.3 03/03/2017 0811   LYMPHSABS 3.2 03/03/2017 0811   MONOABS 0.5 03/03/2017 0811   EOSABS 0.4 03/03/2017 0811   BASOSABS 0.0 03/03/2017 0811   CBG's (last 3):  No results for input(s): GLUCAP in the last 72 hours. LFT's Lab Results  Component Value Date   ALT 23 03/03/2017   AST 22 03/03/2017   ALKPHOS 63 03/03/2017   BILITOT 0.4 03/03/2017    Studies/Results: No results found.  Medications:  I have reviewed the patient's current  medications. Scheduled Medications: . aspirin EC  325 mg Oral Daily  . atorvastatin  40 mg Oral q1800  . enoxaparin (LOVENOX) injection  40 mg Subcutaneous QHS  . feeding supplement  1 Container Oral TID BM  . naphazoline-glycerin  1-2 drop Both Eyes TID WC & HS  . topiramate  25 mg Oral Daily  . 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 L thalamic embolic CVA with residual mild R HP w/ sensory deficits, HA, visual changes and subsequent debility - continue CIR as ongoing with med mgmt as rx'd 2. HA pain d/t above - no new neuro deficits - continue Topamax 3. HTN - permissive due to above - monitor and resume tx as indicated 4 hyperlipidemia - on statin s/p CVA 5. SS trait - OP plans for f/u reviewed in additional to w/u for possible Heme/Rheum issue  Length of stay, days: 3   Valerie A. Felicity Coyer, MD 03/05/2017, 10:36 AM

## 2017-03-06 ENCOUNTER — Inpatient Hospital Stay (HOSPITAL_COMMUNITY): Payer: Medicare Other | Admitting: Occupational Therapy

## 2017-03-06 ENCOUNTER — Inpatient Hospital Stay (HOSPITAL_COMMUNITY): Payer: Medicare Other | Admitting: Speech Pathology

## 2017-03-06 ENCOUNTER — Inpatient Hospital Stay (HOSPITAL_COMMUNITY): Payer: Medicare Other | Admitting: Physical Therapy

## 2017-03-06 DIAGNOSIS — D573 Sickle-cell trait: Secondary | ICD-10-CM

## 2017-03-06 LAB — CARDIOLIPIN ANTIBODIES, IGG, IGM, IGA
Anticardiolipin IgG: <9 GPL U/mL (ref 0–14)
Anticardiolipin IgM: 9 MPL U/mL (ref 0–12)

## 2017-03-06 LAB — PROTHROMBIN GENE MUTATION

## 2017-03-06 LAB — FACTOR 5 LEIDEN

## 2017-03-06 LAB — PROTEIN C, TOTAL: PROTEIN C, TOTAL: 128 % (ref 60–150)

## 2017-03-06 MED ORDER — TOPIRAMATE 25 MG PO TABS
25.0000 mg | ORAL_TABLET | Freq: Two times a day (BID) | ORAL | Status: DC
  Administered 2017-03-06 – 2017-03-07 (×4): 25 mg via ORAL
  Filled 2017-03-06 (×4): qty 1

## 2017-03-06 NOTE — Progress Notes (Signed)
Physical Therapy Session Note  Patient Details  Name: Sharon Chan MRN: [id number] Date of Birth: December 21, 1975  Today's Date: 03/06/2017 PT Individual Time: 1300-1400 PT Individual Time Calculation (min): 60 min   Short Term Goals: Week 1:  PT Short Term Goal 1 (Week 1): STG=LTG due to LOS, Mod I overall for transfers and household gait  Skilled Therapeutic Interventions/Progress Updates: Pt presented in bed agreeable to therapy. Pt indicated HA this afternoon however has been premedicated. Pt performed supine to sit and sit to stand from bed with supervision. Pt ambulated to rehab gym with RW with min guard with some decreased RLE clearance and decreased gait speed. Performed forced use NMR with R visual scanning reaching/tossing horse shoes with feet together and LLE on 2 in step. Also performed standing and mild dynamic balance challenges on airex with no LOB. Visual scanning/object search in hallway with pt able to find object in R visual field 75% of time. Performed toe tap/toe to target with 6 in step with no AD and no LOB. Pt returned to room in same manner as prior with no increase in HA and needs met.      Therapy Documentation Precautions:  Precautions Precautions: Fall Restrictions Weight Bearing Restrictions: No Other Position/Activity Restrictions: R-sided weakness General:   Vital Signs:   Pain: Pain Assessment Pain Assessment: 0-10 Pain Score: 8  Pain Type: Acute pain Pain Location: Hand Pain Orientation: Right;Left Pain Descriptors / Indicators: Aching Pain Frequency: Constant Pain Onset: On-going Patients Stated Pain Goal: 4 Pain Intervention(s): Other (Comment) (premedicated)  See Function Navigator for Current Functional Status.   Therapy/Group: Individual Therapy  [name]  [name], PTA  03/06/2017, 4:05 PM

## 2017-03-06 NOTE — Progress Notes (Signed)
Occupational Therapy Session Note  Patient Details  Name: Sharon Chan MRN: [name]: December 05, 1975  Today's Date: 03/06/2017 OT Individual Time: 1100-1200 and 1430-1500 OT Individual Time Calculation (min): 60 min and 30 min   Short Term Goals: Week 1:  OT Short Term Goal 1 (Week 1): STGs=LTGs due to ELOS  Skilled Therapeutic Interventions/Progress Updates:    Session One: Pt seen for OT session focusing on functional ambulation, R Neuro re-ed, and education.  Pt in supine upon arrival reading Bible. She voiced having difficulty seeing small print in book due to impaired vision, stating it looked like a "kaleidescope". With pt's permission, downloaded magnifying glass App on phone, pt voiced increased ease using App. She donned shoes seated EOB with increased time, able to use B UEs to complete tasks. She ambulated with RW to [facility] gym with supervision, standing rest breaks required throughout.  In BI gym, pt completed Dynavision activity focusing on R attention, use of R UE, and standing balance/ endurance. Completed with increased time with awareness to R side of board, and supervision for standing tasks. She returned back to room at end of session, left seated EOB with all needs in reach.  Educated throughout regarding CVA, increased risk of CVA following a hx of CVA, and d/c planning.   Session Two: Pt seen for OT session focusing on functional ambulation, IADL re-training, and strengthening. Pt in supine upon arrival, agreeable to tx session. She donned shoes seated EOB with increased time.  She ambulated to ADL apartment with RW and supervision. She obtained and replaced weighted items from overhead cabinet using R UE with increased time. She became tearful "with excitement" as she was able to complete task she knew she wouldn't have been able to do the other day and is very happy with her progress thus far.  Following seated rest break, she practiced ambulating in  hallway without AD. Completed with min A, one episode of R LE giving way, required mod-max A to regain balance. Cont to recommend RW for increased safety, ambulation speed and for energy conservation. Pt returned to room at end of session, left in supine with all needs in reach.   Therapy Documentation Precautions:  Precautions Precautions: Fall Restrictions Weight Bearing Restrictions: No Other Position/Activity Restrictions: R-sided weakness Pain: Pain Assessment Pain Assessment: 0-10 Pain Score: 10-Worst pain ever Pain Type: Acute pain Pain Location: Head Pain Orientation: Right;Left Pain Descriptors / Indicators: Aching Pain Frequency: Constant Pain Onset: On-going Patients Stated Pain Goal: 4 Pain Intervention(s): RN aware, repositioned, increased activity ADL: ADL ADL Comments: Please see functional navigator for ADL status  See Function Navigator for Current Functional Status.   Therapy/Group: Individual Therapy  [name], Camille Dragan C 03/06/2017, 7:22 AM

## 2017-03-06 NOTE — Progress Notes (Signed)
Speech Language Pathology Daily Session Note  Patient Details  Name: Sharon Chan MRN: [id number] Date of Birth: [birth year]  Today's Date: 03/06/2017 SLP Individual Time: 4098-1191 SLP Individual Time Calculation (min): 45 min  Short Term Goals: Week 1: SLP Short Term Goal 1 (Week 1): Pt will utilize word finding strategies at the unstructured simple conversation level with supervision cues.  SLP Short Term Goal 2 (Week 1): Pt will self-monitor and self-correct expressive communication with supervision cues.   Skilled Therapeutic Interventions: Skilled treatment session focused on expressive communication goals. SLP facilitated session by providing supervision faded to Mod I for use of word finding strategies at the unstructured conversation level. Pt self-monitored and didn't express any word finding difficulty. Pt left upright in bed, bed alarm on and all needs within reach. Continue per current plan of care.      Function:  Eating Eating   Modified Consistency Diet: No Eating Assist Level: Set up assist for;More than reasonable amount of time   Eating Set Up Assist For: Opening containers       Cognition Comprehension Comprehension assist level: Follows complex conversation/direction with extra time/assistive device  Expression   Expression assist level: Expresses complex ideas: With extra time/assistive device  Social Interaction Social Interaction assist level: Interacts appropriately with others with medication or extra time (anti-anxiety, antidepressant).  Problem Solving Problem solving assist level: Solves complex problems: With extra time  Memory Memory assist level: More than reasonable amount of time    Pain Pain Assessment Pain Assessment: 0-10 Pain Score: 10-Worst pain ever Pain Type: Acute pain Pain Location: Head Pain Orientation: Right;Left Pain Descriptors / Indicators: Aching Pain Frequency: Constant Pain Onset: On-going Patients Stated Pain  Goal: 4 Pain Intervention(s): Medication (See eMAR)  Therapy/Group: Individual Therapy   Jarrad Mclees B. Dreama Saa, [name], [name] Speech-Language Pathologist   Jenee Spaugh 03/06/2017, 3:53 PM

## 2017-03-06 NOTE — Progress Notes (Signed)
Subjective/Complaints:  Discussed vascular HA and treatment, currently on topamax 8m qhs  ROS-  Denies CP,SOB, -N/V/D, no pain with urination Objective: Vital Signs: Blood pressure 131/82, pulse 74, temperature 97.7 F (36.5 C), temperature source Oral, resp. rate 18, height 5' 3"  (1.6 m), weight 102.3 kg (225 lb 9.6 oz), SpO2 97 %. No results found. Results for orders placed or performed during the hospital encounter of 03/02/17 (from the past 72 hour(s))  CBC WITH DIFFERENTIAL     Status: Abnormal   Collection Time: 03/03/17  8:11 AM  Result Value Ref Range   WBC 7.7 4.0 - 10.5 K/uL   RBC 4.90 3.87 - 5.11 MIL/uL   Hemoglobin 12.6 12.0 - 15.0 g/dL   HCT 36.5 36.0 - 46.0 %   MCV 74.5 (L) 78.0 - 100.0 fL   MCH 25.7 (L) 26.0 - 34.0 pg   MCHC 34.5 30.0 - 36.0 g/dL   RDW 15.3 11.5 - 15.5 %   Platelets 264 150 - 400 K/uL   Neutrophils Relative % 45 %   Neutro Abs 3.6 1.7 - 7.7 K/uL   Lymphocytes Relative 42 %   Lymphs Abs 3.2 0.7 - 4.0 K/uL   Monocytes Relative 7 %   Monocytes Absolute 0.5 0.1 - 1.0 K/uL   Eosinophils Relative 5 %   Eosinophils Absolute 0.4 0.0 - 0.7 K/uL   Basophils Relative 1 %   Basophils Absolute 0.0 0.0 - 0.1 K/uL  Comprehensive metabolic panel     Status: Abnormal   Collection Time: 03/03/17  8:11 AM  Result Value Ref Range   Sodium 138 135 - 145 mmol/L   Potassium 3.5 3.5 - 5.1 mmol/L   Chloride 105 101 - 111 mmol/L   CO2 25 22 - 32 mmol/L   Glucose, Bld 120 (H) 65 - 99 mg/dL   BUN 13 6 - 20 mg/dL   Creatinine, Ser 0.83 0.44 - 1.00 mg/dL   Calcium 8.9 8.9 - 10.3 mg/dL   Total Protein 5.8 (L) 6.5 - 8.1 g/dL   Albumin 3.3 (L) 3.5 - 5.0 g/dL   AST 22 15 - 41 U/L   ALT 23 14 - 54 U/L   Alkaline Phosphatase 63 38 - 126 U/L   Total Bilirubin 0.4 0.3 - 1.2 mg/dL   GFR calc non Af Amer >60 >60 mL/min   GFR calc Af Amer >60 >60 mL/min    Comment: (NOTE) The eGFR has been calculated using the CKD EPI equation. This calculation has not been  validated in all clinical situations. eGFR's persistently <60 mL/min signify possible Chronic Kidney Disease.    Anion gap 8 5 - 15     HEENT: no facial assymetry, eyes non injected, no ptosis Cardio: RRR and no murmur or ES Resp: CTA B/L and unlabored GI: BS positive and NT, ND Extremity:  BS positive and NT, ND Skin:   Intact Neuro: Alert/Oriented, Cranial Nerve II-XII normal, Abnormal Sensory reduced LT sensation on right side face V1,2,3, reduced RUE and RLE, Abnormal Motor 4/5 in RUE and RLE, 5/5 on left and Abnormal FMC Ataxic/ dec FMC, difficulty touching thumb to 4th and 5th digit Musc/Skel:  Other no pain with LUE or LLE ROM Gen NAD   Assessment/Plan: 1. Functional deficits secondary to Left thalamic infarct causing right hemiparesis and sensory deficits which require 3+ hours per day of interdisciplinary therapy in a comprehensive inpatient rehab setting. Physiatrist is providing close team supervision and 24 hour management of active medical problems  listed below. Physiatrist and rehab team continue to assess barriers to discharge/monitor patient progress toward functional and medical goals. FIM: Function - Bathing Position: Shower Body parts bathed by patient: Right arm, Left arm, Chest, Abdomen, Front perineal area, Right upper leg, Left upper leg, Right lower leg, Left lower leg Body parts bathed by helper: Buttocks, Back Assist Level: Touching or steadying assistance(Pt > 75%)  Function- Upper Body Dressing/Undressing What is the patient wearing?: Pull over shirt/dress, Bra Bra - Perfomed by patient: Thread/unthread right bra strap, Thread/unthread left bra strap Bra - Perfomed by helper: Hook/unhook bra (pull down sports bra) Pull over shirt/dress - Perfomed by patient: Thread/unthread left sleeve, Put head through opening, Thread/unthread right sleeve Pull over shirt/dress - Perfomed by helper: Thread/unthread right sleeve, Pull shirt over trunk Assist Level:  Touching or steadying assistance(Pt > 75%) Function - Lower Body Dressing/Undressing What is the patient wearing?: Pants, Non-skid slipper socks Position: Wheelchair/chair at sink Pants- Performed by patient: Thread/unthread left pants leg, Thread/unthread right pants leg, Pull pants up/down Pants- Performed by helper: Thread/unthread right pants leg, Pull pants up/down Non-skid slipper socks- Performed by patient: Don/doff right sock, Don/doff left sock Assist for footwear: Supervision/touching assist Assist for lower body dressing: Touching or steadying assistance (Pt > 75%)  Function - Toileting Toileting activity did not occur: No continent bowel/bladder event Toileting steps completed by patient: Adjust clothing prior to toileting, Performs perineal hygiene, Adjust clothing after toileting Toileting steps completed by helper: Adjust clothing after toileting Toileting Assistive Devices: Grab bar or rail Assist level: Supervision or verbal cues  Function Midwife transfer assistive device: Grab bar Assist level to toilet: Supervision or verbal cues Assist level from toilet: Supervision or verbal cues  Function - Chair/bed transfer Chair/bed transfer method: Ambulatory Chair/bed transfer assist level: Supervision or verbal cues Chair/bed transfer assistive device: Walker, Armrests Chair/bed transfer details: Manual facilitation for weight shifting, Tactile cues for initiation, Tactile cues for weight shifting, Verbal cues for sequencing, Verbal cues for technique  Function - Locomotion: Wheelchair Will patient use wheelchair at discharge?: No Max wheelchair distance: 125 Assist Level: Supervision or verbal cues Assist Level: Supervision or verbal cues Wheel 150 feet activity did not occur: Safety/medical concerns Turns around,maneuvers to table,bed, and toilet,negotiates 3% grade,maneuvers on rugs and over doorsills: No Function - Locomotion:  Ambulation Assistive device: No device Max distance: 75 Assist level: Touching or steadying assistance (Pt > 75%) Assist level: Touching or steadying assistance (Pt > 75%) Walk 50 feet with 2 turns activity did not occur: Safety/medical concerns Assist level: Touching or steadying assistance (Pt > 75%) Walk 150 feet activity did not occur: Safety/medical concerns Walk 10 feet on uneven surfaces activity did not occur: Safety/medical concerns  Function - Comprehension Comprehension: Auditory Comprehension assist level: Follows complex conversation/direction with extra time/assistive device  Function - Expression Expression: Verbal Expression assist level: Expresses complex ideas: With extra time/assistive device  Function - Social Interaction Social Interaction assist level: Interacts appropriately with others with medication or extra time (anti-anxiety, antidepressant).  Function - Problem Solving Problem solving assist level: Solves complex problems: With extra time  Function - Memory Memory assist level: Recognizes or recalls 90% of the time/requires cueing < 10% of the time Patient normally able to recall (first 3 days only): Location of own room, Current season, Staff names and faces, That he or she is in a hospital  Medical Problem List and Plan: 1.  Gait abnormality, limitations with self-care secondary to left thalamus infarct. Complaints  from yesterday pm were mainly HA, no new neuro defs Cont CIR PT, OT 2.  DVT Prophylaxis/Anticoagulation: Pharmaceutical: Lovenox 3. Pain Management: Will change oxycodone to ultram prn for joint pain/aches.  increase topamax for HA 4. Mood: team to provide ego support to help with adjustment reaction. LCSW to follow for evaluation and support.  5. Neuropsych: This patient is capable of making decisions on her own behalf. 6. Skin/Wound Care: routine pressure relief measures. 7. Fluids/Electrolytes/Nutrition: Monitor I/O. Check lytes in  am. Offer supplements between meals as reports poor intake.  8. HTN: Permissive HTN  Will add medication as indicated. Cont to monitor for now Vitals:   03/05/17 2020 03/06/17 0500  BP: (!) 143/95 131/82  Pulse: 76 74  Resp:  18  Temp:  97.7 F (36.5 C)   9. Dyslipidemia: On Lipitor.  10.Sickle Cell trait: Iron studies WNL. She does not have primary MD--will need PMD at discharge per Dr. Zigmund Daniel.  Hematology consult appreciated,per Heme CVA etiology likely not sickle cell trait related Rheum f/u as OP 11. Thyroid nodule: Follow up with primary MD for workup. TSH WNL LOS (Days) 4 A FACE TO FACE EVALUATION WAS PERFORMED  Sharon Chan E 03/06/2017, 7:53 AM

## 2017-03-07 ENCOUNTER — Inpatient Hospital Stay (HOSPITAL_COMMUNITY): Payer: Medicare Other | Admitting: Physical Therapy

## 2017-03-07 ENCOUNTER — Encounter (HOSPITAL_COMMUNITY): Payer: Medicare Other | Admitting: Psychology

## 2017-03-07 ENCOUNTER — Inpatient Hospital Stay (HOSPITAL_COMMUNITY): Payer: Medicare Other | Admitting: Speech Pathology

## 2017-03-07 ENCOUNTER — Inpatient Hospital Stay (HOSPITAL_COMMUNITY): Payer: Medicare Other | Admitting: Occupational Therapy

## 2017-03-07 DIAGNOSIS — R269 Unspecified abnormalities of gait and mobility: Secondary | ICD-10-CM

## 2017-03-07 DIAGNOSIS — H539 Unspecified visual disturbance: Secondary | ICD-10-CM

## 2017-03-07 DIAGNOSIS — I69398 Other sequelae of cerebral infarction: Secondary | ICD-10-CM

## 2017-03-07 DIAGNOSIS — F4322 Adjustment disorder with anxiety: Secondary | ICD-10-CM

## 2017-03-07 NOTE — Progress Notes (Signed)
Occupational Therapy Session Note  Patient Details  Name: Sharon Chan MRN: [id number] Date of Birth: [birth year]  Today's Date: 03/07/2017 OT Individual Time: 0752-0900 OT Individual Time Calculation (min): 68 min    Short Term Goals: Week 1:  OT Short Term Goal 1 (Week 1): STGs=LTGs due to ELOS  Skilled Therapeutic Interventions/Progress Updates:    Pt seen for OT ADL bathing/dressing session. Pt in sitting up in bed upon arrival, agreeable to tx session. She voiced complaints of headache which have been chronic since admission, RN delivered pain medicine prior to showering task.  She ambulated throughout room with RW to gather clothing items in prep for showering task, completed with supervision and VCs for RW management. She ambulated to ADL apartment where she completed bathing task seated on tub transfer bench in simulation of home enviornment. Transfer onto bench completed with supervision following VCs for technique. She bathed seated, standing with use of grab bars to complete buttock hygiene/ pericare hygiene with supervision. She dressed seated on EOB with increased time required for fine motor aspects of dressing task. She returned to room and completed grooming tasks standing at sink, utilizing B UEs in functional manner. In therapy gym, she completed fine motor activitity of buttoning and unbuttoning dress shirt, completed with increased time. She then completed fine motor activity utilizing small animal figures. Focus on in hand manipulation and extremity control. She returned to room at end of session, left seated in recliner with mother present.   Therapy Documentation Precautions:  Precautions Precautions: Fall Restrictions Weight Bearing Restrictions: No Other Position/Activity Restrictions: R-sided weakness ADL: ADL ADL Comments: Please see functional navigator for ADL status  See Function Navigator for Current Functional Status.   Therapy/Group: Individual  Therapy  [name], Sharon Chan 03/07/2017, 7:11 AM

## 2017-03-07 NOTE — Progress Notes (Signed)
Subjective/Complaints:  No issues overnite except for HA and blurring of vision  ROS-  Denies CP,SOB, -N/V/D, no pain with urination Objective: Vital Signs: Blood pressure 125/86, pulse 83, temperature 98.2 F (36.8 C), temperature source Oral, resp. rate 18, height  (1.6 m), weight 102.3 kg (225 lb 9.6 oz), SpO2 98 %. No results found. No results found for this or any previous visit (from the past 72 hour(s)).   HEENT: no facial assymetry, eyes non injected, no ptosis Cardio: RRR and no murmur or ES Resp: CTA B/L and unlabored GI: BS positive and NT, ND Extremity:  BS positive and NT, ND Skin:   Intact Neuro: Alert/Oriented, Cranial Nerve II-XII normal, Abnormal Sensory reduced LT sensation on right side face V1,2,3, reduced RUE and RLE, Abnormal Motor 4/5 in RUE and RLE, 5/5 on left and Abnormal FMC Ataxic/ dec FMC, difficulty touching thumb to 4th and 5th digit Right neglect on visual confrontation C/o "double vision"  With Left lateral gaze even when closing one eye Musc/Skel:  Other no pain with LUE or LLE ROM Gen NAD   Assessment/Plan: 1. Functional deficits secondary to Left thalamic infarct causing right hemiparesis and sensory deficits which require 3+ hours per day of interdisciplinary therapy in a comprehensive inpatient rehab setting. Physiatrist is providing close team supervision and 24 hour management of active medical problems listed below. Physiatrist and rehab team continue to assess barriers to discharge/monitor patient progress toward functional and medical goals. FIM: Function - Bathing Position: Shower Body parts bathed by patient: Right arm, Left arm, Chest, Abdomen, Front perineal area, Right upper leg, Left upper leg, Right lower leg, Left lower leg Body parts bathed by helper: Buttocks, Back Assist Level: Touching or steadying assistance(Pt > 75%)  Function- Upper Body Dressing/Undressing What is the patient wearing?: Pull over shirt/dress,  Bra Bra - Perfomed by patient: Thread/unthread right bra strap, Thread/unthread left bra strap Bra - Perfomed by helper: Hook/unhook bra (pull down sports bra) Pull over shirt/dress - Perfomed by patient: Thread/unthread left sleeve, Put head through opening, Thread/unthread right sleeve Pull over shirt/dress - Perfomed by helper: Thread/unthread right sleeve, Pull shirt over trunk Assist Level: Supervision or verbal cues Function - Lower Body Dressing/Undressing What is the patient wearing?: Pants, Non-skid slipper socks Position: Wheelchair/chair at sink Pants- Performed by patient: Thread/unthread left pants leg, Thread/unthread right pants leg, Pull pants up/down Pants- Performed by helper: Thread/unthread right pants leg, Pull pants up/down Non-skid slipper socks- Performed by patient: Don/doff right sock, Don/doff left sock Assist for footwear: Supervision/touching assist Assist for lower body dressing: Touching or steadying assistance (Pt > 75%)  Function - Toileting Toileting activity did not occur: No continent bowel/bladder event Toileting steps completed by patient: Adjust clothing prior to toileting, Performs perineal hygiene, Adjust clothing after toileting Toileting steps completed by helper: Adjust clothing after toileting Toileting Assistive Devices: Grab bar or rail Assist level: Touching or steadying assistance (Pt.75%)  Function - Archivist transfer assistive device: Grab bar Assist level to toilet: Supervision or verbal cues Assist level from toilet: Supervision or verbal cues  Function - Chair/bed transfer Chair/bed transfer method: Ambulatory Chair/bed transfer assist level: Supervision or verbal cues Chair/bed transfer assistive device: Walker, Armrests Chair/bed transfer details: Manual facilitation for weight shifting, Tactile cues for initiation, Tactile cues for weight shifting, Verbal cues for sequencing, Verbal cues for technique  Function -  Locomotion: Wheelchair Will patient use wheelchair at discharge?: No Max wheelchair distance: 125 Assist Level: Supervision or verbal cues  Assist Level: Supervision or verbal cues Wheel 150 feet activity did not occur: Safety/medical concerns Turns around,maneuvers to table,bed, and toilet,negotiates 3% grade,maneuvers on rugs and over doorsills: No Function - Locomotion: Ambulation Assistive device: No device Max distance: 75 Assist level: Touching or steadying assistance (Pt > 75%) Assist level: Touching or steadying assistance (Pt > 75%) Walk 50 feet with 2 turns activity did not occur: Safety/medical concerns Assist level: Touching or steadying assistance (Pt > 75%) Walk 150 feet activity did not occur: Safety/medical concerns Walk 10 feet on uneven surfaces activity did not occur: Safety/medical concerns  Function - Comprehension Comprehension: Auditory Comprehension assist level: Follows complex conversation/direction with extra time/assistive device  Function - Expression Expression: Verbal Expression assist level: Expresses complex ideas: With extra time/assistive device  Function - Social Interaction Social Interaction assist level: Interacts appropriately with others with medication or extra time (anti-anxiety, antidepressant).  Function - Problem Solving Problem solving assist level: Solves complex problems: With extra time  Function - Memory Memory assist level: More than reasonable amount of time Patient normally able to recall (first 3 days only): Location of own room, Current season, Staff names and faces, That he or she is in a hospital  Medical Problem List and Plan: 1.  Gait abnormality, limitations with self-care secondary to left thalamus infarct.  Cont CIR PT, OT 2.  DVT Prophylaxis/Anticoagulation: Pharmaceutical: Lovenox 3. Pain Management: Will change oxycodone to ultram prn for joint pain/aches.  increase topamax for HA 4. Mood: team to provide ego  support to help with adjustment reaction. LCSW to follow for evaluation and support.  5. Neuropsych: This patient is capable of making decisions on her own behalf. 6. Skin/Wound Care: routine pressure relief measures. 7. Fluids/Electrolytes/Nutrition: Monitor I/O. Check lytes in am. Offer supplements between meals as reports poor intake.  8. HTN: Permissive HTN  Will add medication as indicated. Cont to monitor for now Vitals:   03/06/17 2005 03/07/17 0500  BP: (!) 135/92 125/86  Pulse: 82 83  Resp:  18  Temp:  98.2 F (36.8 C)   9. Dyslipidemia: On Lipitor.  10.Sickle Cell trait: Iron studies WNL. She does not have primary MD--will need PMD at discharge per Dr. Ashley Royalty.  Hematology consult appreciated,per Heme CVA etiology likely not sickle cell trait related Rheum f/u as OP 11. Thyroid nodule: Follow up with primary MD for workup. TSH WNL 12.  Visual affects of CVA not a convergence issue, no field cut but has some Right hemispatial neglect, patching will not help LOS (Days) 5 A FACE TO FACE EVALUATION WAS PERFORMED  KIRSTEINS,ANDREW E 03/07/2017, 7:52 AM

## 2017-03-07 NOTE — Consult Note (Signed)
Neuropsychological Consultation   Patient:   Sharon Chan   DOB:   09/15/1976  MR Number:  [id number]  Location:  [facility] [address] [facility] [address] [id number] [place] [address] Dept: [phone] Loc: [phone]           Date of Service:   03/07/2017  Start Time:   1 PM End Time:   2:05 PM  Provider/Observer:  Arley Phenix, Psy.D.       Clinical Neuropsychologist       Billing Code/Service: [phone] 4 Units  Chief Complaint:    The patient was referred for psychological/neuropsychological consultation. The patient had a recent acute nonhemorrhagic infarct involving the left thalamus with more remote infarcts bilaterally to the basal ganglia. There also infarcts in the body of the corpus callosum and scattered white matter disease noted. 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 The patient has been experiencing confusion and auditory experiences that are likely a direct result of the subcortical and white matter tract infarcts. The patient's experiences a been very confusing and stressful to the patient and she has been experiencing images such as people standing next to her in her right visual field. She has not had these experiences in the left visual field. The patient also has a lot of anxiety and stress related to the current event and a great deal of fear that something will happen resulting in another stroke that will  Reason for Service:  Sharon Lajara Richardsonis a 41 y.o. left handed female with reported history of SSD, PCOS who was admitted on 02/25/17.  The patient described her right leg "buckleing under her" and numbness.  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  CTA head/neck done revealing  widespread moderate severe stenosis , Bilateral PCA, left MCA, left ACA and 18 mm left thyroid nodule.  Current Status:  The patient has been feeling more anxiety and fear around intrusive thoughts about having more strokes and fears of becoming disabled permeantly.    Reliability of Information: Information from patient's medical chart, review of case with treatment team and 1 hours face to face interview with patient.  Behavioral Observation: Sharon Chan  presents as a 41 y.o.-year-old Left African American Female who appeared her stated age. her dress was Appropriate and she was Well Groomed and her manners were Appropriate to the situation.  her participation was indicative of Appropriate and Attentive behaviors.  There were physical disabilities noted with right side weakness and fine motor control deficits.  she displayed an appropriate level of cooperation and motivation.     Interactions:    Active Appropriate and Attentive  Attention:   within normal limits and attention span and concentration were age appropriate  Memory:   within normal limits; recent and remote memory intact  Visuo-spatial:  abnormal, the patient as deficits of right visual field with visual disturbance.  Primary vision appears in tact.  Subcortical (Thalamic) and white matter changes distrupting proper interpretation of visual sensory input.    Speech (Volume):  normal  Speech:   normal; normal  Thought Process:  Coherent and Relevant  Though Content:  WNL; not suicidal  Orientation:   person, place, time/date and situation  Judgment:   Good  Planning:   Good  Affect:    Anxious  Mood:    Anxious  Insight:   Good  Intelligence:   normal  Medical History:   Past Medical History:  Diagnosis Date  . Polycystic disease, ovaries   . Sickle cell trait (HCC)             Abuse/Trauma History: Patient had s acute stroke symptoms with subsequent imaging of her brain suggest widespread  vascular stenosis. The patient has developed a great deal along with intrusive thinking around the possibility of having more of these events.  Family Med/Psych History:  Family History  Problem Relation Age of Onset  . Heart attack Mother   . Post-traumatic stress disorder Father     committed suicide  . Diabetes Father   . Hypertension Sister     Risk of Suicide/Violence: virtually non-existent the patient denies any suicidal or  Homicidal ideation  Impression/DX:  The patient recently had an acute vascular event primarily affecting the left basal ganglion region and left thalamic regions a as older issues of white matter involvement and white matter disease. The patient has been diagnosed with ickle cell trait.   The patient has been very fearful of developing issues associated with sickle cell anemia.  Disposition/Plan:  I have talked with the patient about what has happened with her and worked on Producer, television/film/video around the anxiety and fear that she has been experiencing. I will see the patient again tomorrow if possible.  Diagnosis:    LCVA and adjustment disorder with anxiety        Electronically Signed   _______________________ Arley Phenix, Psy.D.

## 2017-03-07 NOTE — Progress Notes (Signed)
Physical Therapy Session Note  Patient Details  Name: Sharon Chan MRN: [id number] Date of Birth: [birth year]  Today's Date: 03/07/2017 PT Individual Time: 1415-1530 PT Individual Time Calculation (min): 75 min   Short Term Goals: Week 1:  PT Short Term Goal 1 (Week 1): STG=LTG due to LOS, Mod I overall for transfers and household gait  Skilled Therapeutic Interventions/Progress Updates: Pt presented in chair agreeable to therapy. Ambulated to rehab gym with minA providing cues for staying midline within RW. Berg Balance Test performed with score 43/56 indicating increased risk of falls.Balance training sit to stand with LLE on block for forced use of RLE. Standing with LLE on block while performing bug puzzle for standing tolerance (46mn before requring rest) and use of BUE. Toe taps to target with HHA no LOB. Performed supine therex, SLR, bridging, HS pulls with yellow therapy ball x 20 and NuStep L1 x 8 min for endurance and reciprocal action. PTA placed market in center of RW, pt ambulated 766fx 2 with improved RW alignment. Ambulated 10076fo AD with min guard. Decreased RLE foot clearance however pt with improved posture and improved gait speed. Pt returned to room and recliner and left with visitors present and needs met.      Therapy Documentation Precautions:  Precautions Precautions: Fall Restrictions Weight Bearing Restrictions: No Other Position/Activity Restrictions: R-sided weakness General:   Vital Signs: Therapy Vitals Temp: 98.3 F (36.8 C) Temp Source: Oral Pulse Rate: 73 Resp: 18 BP: 128/86 Patient Position (if appropriate): Sitting Oxygen Therapy SpO2: 100 % O2 Device: Not Delivered Balance: Standardized Balance Assessment Standardized Balance Assessment: Berg Balance Test Berg Balance Test Sit to Stand: Able to stand without using hands and stabilize independently Standing Unsupported: Able to stand 30 seconds unsupported Sitting with Back  Unsupported but Feet Supported on Floor or Stool: Able to sit safely and securely 2 minutes Stand to Sit: Sits safely with minimal use of hands Transfers: Able to transfer safely, minor use of hands Standing Unsupported with Eyes Closed: Able to stand 10 seconds safely Standing Ubsupported with Feet Together: Able to place feet together independently and stand for 1 minute with supervision From Standing, Reach Forward with Outstretched Arm: Can reach forward >12 cm safely (5") From Standing Position, Pick up Object from Floor: Able to pick up shoe, needs supervision From Standing Position, Turn to Look Behind Over each Shoulder: Looks behind one side only/other side shows less weight shift Turn 360 Degrees: Able to turn 360 degrees safely but slowly Standing Unsupported, Alternately Place Feet on Step/Stool: Able to stand independently and complete 8 steps >20 seconds Standing Unsupported, One Foot in Front: Able to take small step independently and hold 30 seconds Standing on One Leg: Able to lift leg independently and hold equal to or more than 3 seconds Total Score: 43 Exercises:   Other Treatments:     See Function Navigator for Current Functional Status.   Therapy/Group: Individual Therapy  Tomica Arseneault  Printice Hellmer, PTA  03/07/2017, 4:37 PM

## 2017-03-07 NOTE — Progress Notes (Signed)
Speech Language Pathology Daily Session Note  Patient Details  Name: Sharon Chan MRN: [id number] Date of Birth: [birth year]  Today's Date: 03/07/2017 SLP Individual Time: 0900-1000 SLP Individual Time Calculation (min): 60 min  Short Term Goals: Week 1: SLP Short Term Goal 1 (Week 1): Pt will utilize word finding strategies at the unstructured simple conversation level with supervision cues.  SLP Short Term Goal 2 (Week 1): Pt will self-monitor and self-correct expressive communication with supervision cues.   Skilled Therapeutic Interventions: Skilled treatment session focused on expressive speech goals. SLP facilitated session by providing Mod I to utilize word finding strategies and pt without any evidence of word finding deficits at complex unstructured conversation level. Pt able to self-monitor with no self-corrections needed. POC and progress reviewed with pt. Pt pleased with progress but comments indicate that pt enjoys attention from therapies and CVA. Pt also provides that she stuttered as a child and received extensive speech therapy. Pt frequently stated that she had difficulty recalling present day but then in conversation she voluntarily recalled tomorrow being Wednesday and her team conference day. Then she appears to realize. Pt returned to room, left in recliner with mom present. Continue per current plan of care.      Function:    Cognition Comprehension Comprehension assist level: Follows complex conversation/direction with extra time/assistive device  Expression   Expression assist level: Expresses complex ideas: With extra time/assistive device  Social Interaction Social Interaction assist level: Interacts appropriately with others with medication or extra time (anti-anxiety, antidepressant).  Problem Solving Problem solving assist level: Solves complex problems: With extra time  Memory Memory assist level: More than reasonable amount of time    Pain Pain  Assessment Pain Assessment: 0-10 Pain Score: 8  Pain Type: Acute pain Pain Location: Head Pain Descriptors / Indicators: Aching Pain Onset: On-going Patients Stated Pain Goal: 0 Pain Intervention(s): Medication (See eMAR)  Therapy/Group: Individual Therapy   Andrus Sharp B. Dreama Saa, [name], [name] Speech-Language Pathologist   Jeyden Coffelt 03/07/2017, 9:38 AM

## 2017-03-07 NOTE — Progress Notes (Signed)
Patient information entered into eRehab system by Rebecca Windsor, PT and reviewed by Alida Greiner, RN, CRRN, PPS Coordinator.  Information including medical coding and functional independence measure will be reviewed and updated through discharge.     Per nursing patient was given "Data Collection Information Summary for Patients in Inpatient Rehabilitation Facilities with attached "Privacy Act Statement-Health Care Records" upon admission.  

## 2017-03-08 ENCOUNTER — Inpatient Hospital Stay (HOSPITAL_COMMUNITY): Payer: Medicare Other | Admitting: Occupational Therapy

## 2017-03-08 ENCOUNTER — Inpatient Hospital Stay (HOSPITAL_COMMUNITY): Payer: Medicare Other | Admitting: Speech Pathology

## 2017-03-08 ENCOUNTER — Inpatient Hospital Stay (HOSPITAL_COMMUNITY): Payer: Medicare Other | Admitting: Physical Therapy

## 2017-03-08 ENCOUNTER — Encounter: Payer: Self-pay | Admitting: *Deleted

## 2017-03-08 DIAGNOSIS — E784 Other hyperlipidemia: Secondary | ICD-10-CM

## 2017-03-08 LAB — CBC
HCT: 39.4 % (ref 36.0–46.0)
Hemoglobin: 13 g/dL (ref 12.0–15.0)
MCH: 25 pg — ABNORMAL LOW (ref 26.0–34.0)
MCHC: 33 g/dL (ref 30.0–36.0)
MCV: 75.6 fL — ABNORMAL LOW (ref 78.0–100.0)
Platelets: 321 10*3/uL (ref 150–400)
RBC: 5.21 MIL/uL — ABNORMAL HIGH (ref 3.87–5.11)
RDW: 15.6 % — ABNORMAL HIGH (ref 11.5–15.5)
WBC: 6.1 10*3/uL (ref 4.0–10.5)

## 2017-03-08 LAB — BASIC METABOLIC PANEL
Anion gap: 8 (ref 5–15)
BUN: 13 mg/dL (ref 6–20)
CALCIUM: 9.2 mg/dL (ref 8.9–10.3)
CO2: 24 mmol/L (ref 22–32)
CREATININE: 0.88 mg/dL (ref 0.44–1.00)
Chloride: 106 mmol/L (ref 101–111)
GFR calc Af Amer: >60 mL/min (ref >60)
Glucose, Bld: 103 mg/dL — ABNORMAL HIGH (ref 65–99)
POTASSIUM: 3.5 mmol/L (ref 3.5–5.1)
SODIUM: 138 mmol/L (ref 135–145)

## 2017-03-08 LAB — OCCULT BLOOD X 1 CARD TO LAB, STOOL: FECAL OCCULT BLD: NEGATIVE

## 2017-03-08 MED ORDER — TOPIRAMATE 25 MG PO TABS
50.0000 mg | ORAL_TABLET | Freq: Two times a day (BID) | ORAL | Status: DC
  Administered 2017-03-08 – 2017-03-09 (×4): 50 mg via ORAL
  Filled 2017-03-08 (×4): qty 2

## 2017-03-08 NOTE — Patient Care Conference (Signed)
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 Visual disturbance as complication of stroke 03/07/2017  . Adjustment disorder with anxious mood   . 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 Sickle cell trait (HCC)   . Hb-SS disease with crisis (HCC)   . Hyperlipidemia   . Thyroid nodule   . Changes in vision   . Acute ischemic stroke (HCC)   . Dysphagia, post-stroke   . Benign essential HTN   . Polycystic kidney disease   . Prediabetes   . Nonintractable headache   . Leukocytosis   . Stroke (cerebrum) (HCC) 02/25/2017    Expected Discharge Date: Expected Discharge Date: 03/11/17  Team Members Present: Physician leading conference: Dr. Claudette Laws Social Worker Present: Dossie Der, LCSW Nurse Present: Carmie End, RN PT Present: Harless Litten, PTA;Grier Rocher, PT OT Present: Johnsie Cancel, OT SLP Present: Reuel Derby, SLP PPS Coordinator present : Tora Duck, RN, CRRN     Current Status/Progress Goal Weekly Team Focus  Medical   vascular HA, LE pain exercise related  maintain med stability , provide education   med management for vascular HA   Bowel/Bladder   continent of bowel & bladder, LBM 03/04/17, has miralax & suppository PRN, given miralax last night  continue continence  continue to monitor & assist as needed    Swallow/Nutrition/ Hydration             ADL's   Supervision- min A overall; using RW for functional mobility  Mod I overall; supervision tub/shower transfer and IADL goals  ADL/IADL re-training; activity tolerance; education; d/c planning   Mobility   Supervision with RW, min guard with SPC, supervision stairs, min guard higher level balance  Mod I household, supervision community ambulation  higher level balance, endurace   Communication   Supervision to Mod I   Mod I  use of word finding strategies within unstructured simple conversation   Safety/Cognition/ Behavioral Observations            Pain   C/o headache for the lat 3 days, pain scale 10/10. Has tramadol, tylenol, prn, has topimax BID, no relief from non-pharm methods or meds  pain scale <5  continue to assess & treat as needed   Skin   bruising to the abdomen, no other skin issues  no new areas of skin breakdown  assess q shift      *See Care Plan and progress notes for long and short-term goals.  Barriers to Discharge: still requires physical assist    Possible Resolutions to Barriers:  cont CIR    Discharge Planning/Teaching Needs:  Can go to Mom's home but would rather go to her home if she can and is safe to do so.       Team Discussion:  Making good progress in therapies. Goals of mod/I level, MD adjusting headache med and pt aware of this. Main issue is word finding and anxiety. Being seen by neuro-psych may need to follow up with him also. Mom to stay with short time once discharge.   Revisions to Treatment Plan:  DC 4/28   Continued Need for Acute Rehabilitation Level of Care: The patient requires daily medical management by a physician with specialized training in physical medicine and rehabilitation for the following conditions: Daily direction of a multidisciplinary physical rehabilitation program to ensure safe treatment while eliciting the highest outcome that is of practical value to the patient.:  Yes Daily medical management of patient stability for increased activity during participation in an intensive rehabilitation regime.: Yes Daily analysis of laboratory values and/or radiology reports with any subsequent need for medication adjustment of medical intervention for : Neurological problems;Mood/behavior problems  Hurley Blevins, Lemar Livings 03/08/2017, 1:39 PM

## 2017-03-08 NOTE — Progress Notes (Signed)
Marry Guan tech at [facility] called regarding alpha globin gene mapping wanting to check if alpha thalassemia DNA analysis is the same test. Per Truett Perna, MD those are the same test. Notified Selena Batten, lab tech of this information-understanding verbalized.

## 2017-03-08 NOTE — Progress Notes (Deleted)
Physical Therapy Session Note  Patient Details  Name: Sharon Chan MRN: [id number] Date of Birth: 02-15-1976  Today's Date: 03/08/2017 PT Individual Time:  -      Short Term Goals: Week 1:  PT Short Term Goal 1 (Week 1): STG=LTG due to LOS, Mod I overall for transfers and household gait  Skilled Therapeutic Interventions/Progress Updates:      Therapy Documentation Precautions:  Precautions Precautions: Fall Restrictions Weight Bearing Restrictions: No Other Position/Activity Restrictions: R-sided weakness General:   Vital Signs: Therapy Vitals Temp: 98.2 F (36.8 C) Temp Source: Oral Pulse Rate: 70 Resp: 18 BP: 122/86 Patient Position (if appropriate): Lying Oxygen Therapy SpO2: 100 % O2 Device: Not Delivered Pain: Pain Assessment Pain Assessment: 0-10 Pain Score: 10-Worst pain ever Pain Type: Acute pain Pain Location: Head Pain Descriptors / Indicators: Headache Pain Frequency: Constant Pain Onset: On-going Pain Intervention(s): Medication (See eMAR) Mobility:   Locomotion :    Trunk/Postural Assessment :    Balance:   Exercises:   Other Treatments:     See Function Navigator for Current Functional Status.   Therapy/Group: Individual Therapy  Lekeith Wulf 03/08/2017, 7:48 AM

## 2017-03-08 NOTE — Progress Notes (Signed)
Physical Therapy Session Note  Patient Details  Name: Sharon Chan MRN: [id number] Date of Birth: 1976/02/03  Today's Date: 03/08/2017 PT Individual Time: 0900-0945 PT Individual Time Calculation (min): 45 min   Short Term Goals: Week 1:  PT Short Term Goal 1 (Week 1): STG=LTG due to LOS, Mod I overall for transfers and household gait  Skilled Therapeutic Interventions/Progress Updates: Pt presented in recliner with mother present however leaving shortly after PTA arrived. Nsg present to administer meds as pt continues to indicate 10/10 pain. Introduced SPC, pt ambulated 192ft with min guard, decreased sway, with occasional decreased RLE clearance. Asecend/descend x 8 steps B rails step through pattern. Performed RLE forward and lateral step ups with 6in step for RLE strengthening. Performed obstacle course including weaving, step overs, step ups all performed with SPC. Pt performed with min guard and x 1 mild LOB which pt was able to self recover. Pt returned to room with [facility] and remained in recliner at end of session.      Therapy Documentation Precautions:  Precautions Precautions: Fall Restrictions Weight Bearing Restrictions: No Other Position/Activity Restrictions: R-sided weakness General:   Vital Signs: Therapy Vitals Pulse Rate: 86 BP: 126/86 Patient Position (if appropriate): Sitting Oxygen Therapy SpO2: 99 % Pain: Pain Assessment Pain Assessment: 0-10 Pain Score: 10-Worst pain ever Pain Type: Acute pain Pain Location: Head Pain Intervention(s): Medication (See eMAR)   See Function Navigator for Current Functional Status.   Therapy/Group: Individual Therapy  Kaiyla Stahly  Justinn Welter, PTA  03/08/2017, 12:40 PM

## 2017-03-08 NOTE — Progress Notes (Signed)
Occupational Therapy Session Note  Patient Details  Name: Sharon Chan MRN: [id number] Date of Birth: May 25, 1976  Today's Date: 03/08/2017 OT Individual Time: 6213-0865 and 1430-1500 OT Individual Time Calculation (min): 60 min  And 30 min   Short Term Goals: Week 1:  OT Short Term Goal 1 (Week 1): STGs=LTGs due to ELOS  Skilled Therapeutic Interventions/Progress Updates:    Session One: Pt seen for OT ADL bathing/dressing session. Pt sitting up in bed upon arrival with lab staff present drawing blood. Pt cont with complaints of chronic headache, however, agreeable to tx session. She ambulated throughout room with RW to gather clothing items in prep for showering task. Completed with supervision, with cuing not to reach too far outside BOS due to impaired balance.  She bathed seated on shower chair mod I, standing with use of grab bars to complete buttock/ pericare hygiene.  She dressed seated on toilet with increased time for fine motor aspects of dressing task. Grooming completed standing at sink with  supervision.  She ambulated into bathroom to gather dirty towels from floor, VCs for RW management during functional task. Trialed ambulation within room without RW, significantly decreased ambulation speed without AD and pt grabbing for furniture for stability.  Pt left seated in recliner at end of session, set-up with breakfast, and all needs in reach.   Session Two: Pt seen for OT session addressing IADL laundry goal and for co-tx with recreational therapist. Pt standing at sink upon arrival, reviewed need for staff assistance for any standing and mobility needs- she voiced understanding.  She ambulated throughout unit using [facility] with supervision. She completed simulated laundry task utilizing unit laundry facility at supervision- mod I level. She stood to fold laundry using B UEs simultaneously to complete task. Seated in therapy day room, co-tx with recreational therapist discussing  return to leisure pursuits, PLOF, energy conservation, delegating responsibilities, importance of participation with ADLs/IADLs as well as need for rest breaks throughout the day.  Pt left in day room with hand off to SLP.   Therapy Documentation Precautions:  Precautions Precautions: Fall Restrictions Weight Bearing Restrictions: No Other Position/Activity Restrictions: R-sided weakness Pain: Pain Assessment Pain Assessment: 0-10 Pain Score: 10-Worst pain ever Pain Type: Acute pain Pain Location: Head Pain Descriptors / Indicators: Headache Pain Frequency: Constant Pain Onset: On-going Pain Intervention(s): Shower, repositioned ADL: ADL ADL Comments: Please see functional navigator for ADL status  See Function Navigator for Current Functional Status.   Therapy/Group: Individual Therapy  [name], Janasha Barkalow C 03/08/2017, 7:11 AM

## 2017-03-08 NOTE — Progress Notes (Signed)
Speech Language Pathology Daily Session Note  Patient Details  Name: SHAHLA BETSILL MRN: [id number] Date of Birth: [birth year]  Today's Date: 03/08/2017 SLP Individual Time: 1500-1600 SLP Individual Time Calculation (min): 60 min  Short Term Goals: Week 1: SLP Short Term Goal 1 (Week 1): Pt will utilize word finding strategies at the unstructured simple conversation level with supervision cues.  SLP Short Term Goal 2 (Week 1): Pt will self-monitor and self-correct expressive communication with supervision cues.   Skilled Therapeutic Interventions: Skilled treatment session focused on expressive communication goals. SLP facilitated session by assessing pt's expressive abilities at the unstructured complex conversation. Pt with no evidence of word finding deficits. Pt was returned to room, left upright in recliner with all needs within reach. Continue per current plan of care.      Function:    Cognition Comprehension Comprehension assist level: Follows complex conversation/direction with extra time/assistive device  Expression   Expression assist level: Expresses complex ideas: With extra time/assistive device  Social Interaction Social Interaction assist level: Interacts appropriately with others with medication or extra time (anti-anxiety, antidepressant).  Problem Solving Problem solving assist level: Solves complex problems: With extra time  Memory Memory assist level: More than reasonable amount of time    Pain Pain Assessment Pain Score: 10-Worst pain ever  Therapy/Group: Individual Therapy   Jahlen Bollman B. Dreama Saa, [name], [name] Speech-Language Pathologist   Aniko Finnigan 03/08/2017, 4:24 PM

## 2017-03-08 NOTE — Progress Notes (Signed)
Subjective/Complaints:   ROS-  Denies CP,SOB, -N/V/D, no pain with urination Objective: Vital Signs: Blood pressure 122/86, pulse 70, temperature 98.2 F (36.8 C), temperature source Oral, resp. rate 18, height  (1.6 m), weight 110.1 kg (242 lb 11.6 oz), SpO2 100 %. No results found. Results for orders placed or performed during the hospital encounter of 03/02/17 (from the past 72 hour(s))  CBC     Status: Abnormal   Collection Time: 03/08/17  7:41 AM  Result Value Ref Range   WBC 6.1 4.0 - 10.5 K/uL   RBC 5.21 (H) 3.87 - 5.11 MIL/uL   Hemoglobin 13.0 12.0 - 15.0 g/dL   HCT 16.1 09.6 - 04.5 %   MCV 75.6 (L) 78.0 - 100.0 fL   MCH 25.0 (L) 26.0 - 34.0 pg   MCHC 33.0 30.0 - 36.0 g/dL   RDW 40.9 (H) 81.1 - 91.4 %   Platelets 321 150 - 400 K/uL     HEENT: no facial assymetry, eyes non injected, no ptosis Cardio: RRR and no murmur or ES Resp: CTA B/L and unlabored GI: BS positive and NT, ND Extremity:  BS positive and NT, ND Skin:   Intact Neuro: Alert/Oriented, Cranial Nerve II-XII normal, Abnormal Sensory reduced LT sensation on right side face V1,2,3, reduced RUE and RLE, Abnormal Motor 4/5 in RUE and RLE, 5/5 on left and Abnormal FMC Ataxic/ dec FMC, difficulty touching thumb to 4th and 5th digit Right neglect on visual confrontation C/o "double vision"  With Left lateral gaze even when closing one eye Musc/Skel:  Other no pain with LUE or LLE ROM Gen NAD   Assessment/Plan: 1. Functional deficits secondary to Left thalamic infarct causing right hemiparesis and sensory deficits which require 3+ hours per day of interdisciplinary therapy in a comprehensive inpatient rehab setting. Physiatrist is providing close team supervision and 24 hour management of active medical problems listed below. Physiatrist and rehab team continue to assess barriers to discharge/monitor patient progress toward functional and medical goals. FIM: Function - Bathing Position: Shower Body  parts bathed by patient: Right arm, Left arm, Chest, Abdomen, Front perineal area, Right upper leg, Left upper leg, Right lower leg, Left lower leg, Buttocks, Back Body parts bathed by helper: Buttocks, Back Assist Level: More than reasonable time  Function- Upper Body Dressing/Undressing What is the patient wearing?: Pull over shirt/dress, Bra Bra - Perfomed by patient: Thread/unthread right bra strap, Thread/unthread left bra strap, Hook/unhook bra (pull down sports bra) Bra - Perfomed by helper: Hook/unhook bra (pull down sports bra) Pull over shirt/dress - Perfomed by patient: Thread/unthread left sleeve, Put head through opening, Thread/unthread right sleeve, Pull shirt over trunk Pull over shirt/dress - Perfomed by helper: Thread/unthread right sleeve, Pull shirt over trunk Assist Level: More than reasonable time Function - Lower Body Dressing/Undressing What is the patient wearing?: Pants, Socks, Shoes, Underwear Position: Other (comment) Agricultural consultant) Underwear - Performed by patient: Thread/unthread right underwear leg, Thread/unthread left underwear leg, Pull underwear up/down Pants- Performed by patient: Thread/unthread left pants leg, Thread/unthread right pants leg, Pull pants up/down Pants- Performed by helper: Thread/unthread right pants leg, Pull pants up/down Non-skid slipper socks- Performed by patient: Don/doff right sock, Don/doff left sock Socks - Performed by patient: Don/doff right sock, Don/doff left sock Shoes - Performed by patient: Don/doff right shoe, Don/doff left shoe, Fasten right, Fasten left Assist for footwear: Setup Assist for lower body dressing: Supervision or verbal cues  Function - Toileting Toileting activity did not occur: No continent  bowel/bladder event Toileting steps completed by patient: Adjust clothing prior to toileting, Performs perineal hygiene, Adjust clothing after toileting Toileting steps completed by helper: Adjust clothing after  toileting Toileting Assistive Devices: Grab bar or rail Assist level: Touching or steadying assistance (Pt.75%)  Function - Toilet Transfers Toilet transfer assistive device: Grab bar Assist level to toilet: Supervision or verbal cues Assist level from toilet: Supervision or verbal cues  Function - Chair/bed transfer Chair/bed transfer method: Ambulatory Chair/bed transfer assist level: Supervision or verbal cues Chair/bed transfer assistive device: Walker, Armrests Chair/bed transfer details: Manual facilitation for weight shifting, Tactile cues for initiation, Tactile cues for weight shifting, Verbal cues for sequencing, Verbal cues for technique  Function - Locomotion: Wheelchair Will patient use wheelchair at discharge?: No Max wheelchair distance: 125 Assist Level: Supervision or verbal cues Assist Level: Supervision or verbal cues Wheel 150 feet activity did not occur: Safety/medical concerns Turns around,maneuvers to table,bed, and toilet,negotiates 3% grade,maneuvers on rugs and over doorsills: No Function - Locomotion: Ambulation Assistive device: No device Max distance: 100 Assist level: Touching or steadying assistance (Pt > 75%) Assist level: Touching or steadying assistance (Pt > 75%) Walk 50 feet with 2 turns activity did not occur: Safety/medical concerns Assist level: Touching or steadying assistance (Pt > 75%) Walk 150 feet activity did not occur: Safety/medical concerns Walk 10 feet on uneven surfaces activity did not occur: Safety/medical concerns  Function - Comprehension Comprehension: Auditory Comprehension assist level: Follows complex conversation/direction with extra time/assistive device  Function - Expression Expression: Verbal Expression assist level: Expresses complex ideas: With extra time/assistive device  Function - Social Interaction Social Interaction assist level: Interacts appropriately with others with medication or extra time  (anti-anxiety, antidepressant).  Function - Problem Solving Problem solving assist level: Solves complex problems: With extra time  Function - Memory Memory assist level: More than reasonable amount of time Patient normally able to recall (first 3 days only): Location of own room, Current season, Staff names and faces, That he or she is in a hospital  Medical Problem List and Plan: 1.  Gait abnormality, limitations with self-care secondary to left thalamus infarct.  Cont CIR PT, OT 2.  DVT Prophylaxis/Anticoagulation: Pharmaceutical: Lovenox 3. Pain Management: Will change oxycodone to ultram prn for joint pain/aches.  increase topamax for HA 4. Mood: team to provide ego support to help with adjustment reaction. LCSW to follow for evaluation and support.  5. Neuropsych: This patient is capable of making decisions on her own behalf. 6. Skin/Wound Care: routine pressure relief measures. 7. Fluids/Electrolytes/Nutrition: Monitor I/O. Check lytes in am. Offer supplements between meals as reports poor intake.  8. HTN: Permissive HTN  Will add medication as indicated. Cont to monitor for now Vitals:   03/07/17 1301 03/08/17 0517  BP: 128/86 122/86  Pulse: 73 70  Resp: 18 18  Temp: 98.3 F (36.8 C) 98.2 F (36.8 C)   9. Dyslipidemia: On Lipitor.  10.Sickle Cell trait: Iron studies WNL. She does not have primary MD--will need PMD at discharge per Dr. Ashley Royalty.  Hematology consult appreciated,per Heme CVA etiology likely not sickle cell trait related Rheum f/u as OP  11. Thyroid nodule: Follow up with primary MD for workup. TSH WNL 12.  Visual affects of CVA not a convergence issue, no field cut but has some Right hemispatial neglect, patching will not help  13.  Diffuse intracranial atherosclerosis- cont ASA   Moderate or severe stenoses of the: - Left PCA P1 and P2, - Right MCA origin  and M1, - Left ACA A2 and bilateral distal pericallosal arteries, - Left MCA M2 and M3. 14.   Poor appetite, may be mood disorder related to CVA, vs Side effect of topamax, supplement with boost, given obesity long range goal is weight loss but does need adequate nutrition for acute recovery- intake 10-75% of meals LOS (Days) 6 A FACE TO FACE EVALUATION WAS PERFORMED  Sharon Chan E 03/08/2017, 8:49 AM

## 2017-03-08 NOTE — Progress Notes (Signed)
Social Work Patient ID: Sharon Chan, female   DOB: Jan 23, 1976, 41 y.o.   MRN: [id number]  Met with pt to discuss team conference goals mod/I level and target discharge date of 4/28. She is feeling good about her progress And her levels. She can see her progress but at times is till anxious about her recovery and having her risk of having another stroke. Will discuss follow up therapies with team and discuss with pt. Pt's Mom to be there at home with her for  A short time. Work on discharge needs.

## 2017-03-08 NOTE — Progress Notes (Signed)
Social Work Lucy Chris, LCSW Social Worker Signed   Patient Care Conference Date of Service: 03/08/2017  1:39 PM      Hide copied text Hover for attribution information Inpatient RehabilitationTeam Conference and Plan of Care Update Date: 03/08/2017   Time: 10:30 AM      Patient Name: Sharon Chan      Medical Record Number: [id number]  Date of Birth: 02/02/1976 Sex: Female         Room/Bed: 4M12C/4M12C-01 Payor Info: Payor: MEDICARE / Plan: MEDICARE PART A AND B / Product Type: *No Product type* /     Admitting Diagnosis: L CVS  Admit Date/Time:  03/02/2017  4:49 PM Admission Comments: No comment available    Primary Diagnosis:  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 Visual disturbance as complication of stroke 03/07/2017  . Adjustment disorder with anxious mood    . 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 Sickle cell trait (HCC)    . Hb-SS disease with crisis (HCC)    . Hyperlipidemia    . Thyroid nodule    . Changes in vision    . Acute ischemic stroke (HCC)    . Dysphagia, post-stroke    . Benign essential HTN    . Polycystic kidney disease    . Prediabetes    . Nonintractable headache    . Leukocytosis    . Stroke (cerebrum) (HCC) 02/25/2017      Expected Discharge Date: Expected Discharge Date: 03/11/17   Team Members Present: Physician leading conference: Dr. Claudette Laws Social Worker Present: Dossie Der, LCSW Nurse Present: Carmie End, RN PT Present: Harless Litten, PTA;Grier Rocher, PT OT Present: Johnsie Cancel, OT SLP Present: Reuel Derby, SLP PPS Coordinator present : Tora Duck, RN, CRRN       Current Status/Progress Goal Weekly Team Focus  Medical     vascular HA, LE pain exercise related  maintain med stability , provide education    med management for vascular HA   Bowel/Bladder     continent of bowel & bladder, LBM 03/04/17, has miralax & suppository PRN, given miralax last night  continue continence  continue to monitor & assist as needed   Swallow/Nutrition/ Hydration               ADL's     Supervision- min A overall; using RW for functional mobility  Mod I overall; supervision tub/shower transfer and IADL goals  ADL/IADL re-training; activity tolerance; education; d/c planning   Mobility     Supervision with RW, min guard with SPC, supervision stairs, min guard higher level balance  Mod I household, supervision community ambulation  higher level balance, endurace   Communication     Supervision to Mod I   Mod I  use of word finding strategies within unstructured simple conversation   Safety/Cognition/ Behavioral Observations             Pain     C/o headache for the lat 3 days, pain scale 10/10. Has tramadol, tylenol, prn, has topimax BID, no relief from non-pharm methods or meds  pain scale <5  continue to assess & treat as needed   Skin     bruising to the abdomen, no other skin issues  no new areas  of skin breakdown  assess q shift     *See Care Plan and progress notes for long and short-term goals.   Barriers to Discharge: still requires physical assist     Possible Resolutions to Barriers:  cont CIR     Discharge Planning/Teaching Needs:  Can go to Mom's home but would rather go to her home if she can and is safe to do so.       Team Discussion:  Making good progress in therapies. Goals of mod/I level, MD adjusting headache med and pt aware of this. Main issue is word finding and anxiety. Being seen by neuro-psych may need to follow up with him also. Mom to stay with short time once discharge.   Revisions to Treatment Plan:  DC 4/28    Continued Need for Acute Rehabilitation Level of Care: The patient requires daily medical management by a physician with specialized training in physical medicine  and rehabilitation for the following conditions: Daily direction of a multidisciplinary physical rehabilitation program to ensure safe treatment while eliciting the highest outcome that is of practical value to the patient.: Yes Daily medical management of patient stability for increased activity during participation in an intensive rehabilitation regime.: Yes Daily analysis of laboratory values and/or radiology reports with any subsequent need for medication adjustment of medical intervention for : Neurological problems;Mood/behavior problems   Lucy Chris 03/08/2017, 1:39 PM       Patient ID: Sharon Chan, female   DOB: 01-Feb-1976, 41 y.o.   MRN: [id number]

## 2017-03-09 ENCOUNTER — Inpatient Hospital Stay (HOSPITAL_COMMUNITY): Payer: Medicare Other | Admitting: Physical Therapy

## 2017-03-09 ENCOUNTER — Inpatient Hospital Stay (HOSPITAL_COMMUNITY): Payer: Medicare Other | Admitting: Occupational Therapy

## 2017-03-09 ENCOUNTER — Inpatient Hospital Stay (HOSPITAL_COMMUNITY): Payer: Medicare Other | Admitting: Speech Pathology

## 2017-03-09 DIAGNOSIS — G44029 Chronic cluster headache, not intractable: Secondary | ICD-10-CM

## 2017-03-09 DIAGNOSIS — E781 Pure hyperglyceridemia: Secondary | ICD-10-CM

## 2017-03-09 MED ORDER — TRAMADOL HCL 50 MG PO TABS
50.0000 mg | ORAL_TABLET | Freq: Once | ORAL | Status: AC
  Administered 2017-03-09: 50 mg via ORAL

## 2017-03-09 MED ORDER — ACETAMINOPHEN 325 MG PO TABS: 325.0000 mg | ORAL_TABLET | ORAL | Status: DC | PRN

## 2017-03-09 MED ORDER — TRAMADOL HCL 50 MG PO TABS
100.0000 mg | ORAL_TABLET | Freq: Four times a day (QID) | ORAL | Status: DC | PRN
  Administered 2017-03-09 (×2): 100 mg via ORAL
  Filled 2017-03-09 (×3): qty 2

## 2017-03-09 MED ORDER — SENNOSIDES-DOCUSATE SODIUM 8.6-50 MG PO TABS
2.0000 | ORAL_TABLET | Freq: Every day | ORAL | Status: DC
  Administered 2017-03-09 – 2017-03-10 (×2): 2 via ORAL
  Filled 2017-03-09 (×3): qty 2

## 2017-03-09 NOTE — Progress Notes (Signed)
Physical Therapy Session Note  Patient Details  Name: Sharon Chan MRN: [id number] Date of Birth: 06/10/1976  Today's Date: 03/09/2017 PT Individual Time: 2130-8657 PT Individual Time Calculation (min): 60 min   Short Term Goals: Week 1:  PT Short Term Goal 1 (Week 1): STG=LTG due to LOS, Mod I overall for transfers and household gait  Skilled Therapeutic Interventions/Progress Updates:   Pt received sitting EOB and agreeable to PT. Gait training with SPC to rehab gym, 131f x2 with supervision assist from PT.    Throughout treatment, pt performed sit<>stand with supervision assist progressing to Mod I using SPC .   Dynamic balance training for lateral step ups on 6 inch step as well as alternating anterior toe tap. All completed  12 with supervision assist from PT and no UE support.   WC mobility for forced use of the RUE and improved neuromotor control, [id number] main entrance of hospital and from gift shop to rehab unit. Min cues for awareness of door frames on the R.   Gait training on uneven cement sidewalk x [id number] SPC. Overall supervision assist and min assist x 1 to prevent LOB with turn to R. Additional gait training instructed by PT through hospital gift shop  X [id number] [place] supervision assist from PT. No LOB throughout and only min cues for improved visual scanning the R to prevent hitting obstacles on the R.   Patient returned too room and left sitting  In recliner with call bell in reach and all needs met.         Therapy Documentation Precautions:  Precautions Precautions: Fall Restrictions Weight Bearing Restrictions: No Other Position/Activity Restrictions: R-sided weakness    Pain: Pain Assessment Pain Assessment: No/denies pain Pain Score: 10-Worst pain ever Pain Type: Acute pain Pain Location: Head Pain Descriptors / Indicators: Headache;Throbbing Pain Frequency: Constant Pain Onset: On-going   See Function Navigator for  Current Functional Status.   Therapy/Group: Individual Therapy  AustiLorie Phenix/2018, 10:46 AM

## 2017-03-09 NOTE — Progress Notes (Signed)
Occupational Therapy Session Note  Patient Details  Name: Sharon Chan MRN: [id number] Date of Birth: 1976/07/12  Today's Date: 03/09/2017 OT Individual Time: 2536-6440 OT Individual Time Calculation (min): 60 min    Short Term Goals: Week 1:  OT Short Term Goal 1 (Week 1): STGs=LTGs due to ELOS  Skilled Therapeutic Interventions/Progress Updates:    Pt seen for OT ADL bathing/dressing session. Pt in supine upon arrival, ready for tx session. She cont with complaints of headache, however, willing to cont with tx. She ambulated throughout session with SPC and distant supervision. She ambulated throughout room to gather items in prep for showering task and following shower to clean up towels/ dirty clothing. .  She bathed seated on shower chair mod I and dressed sit <> stand from toilet. Reccommended pt sit to completed higher level balance tasks (I.e. While donning socks) to reduce fall risk.  She ambulated to ADL apartment and completed simple meal prep of making pudding. She ambulated throughout kitchen to gather items with distant supervision. Used R UE at non-dominant level to complete task with slightly increased time. Pt returned to room at end of session, left seated in recliner with all needs in reach.  Throughout session, educated regarding ways to reduce risk of falls during ADL tasks, continuum of care, and d/c planning.   Therapy Documentation Precautions:  Precautions Precautions: Fall Restrictions Weight Bearing Restrictions: No Other Position/Activity Restrictions: R-sided weakness Pain: Pain Assessment Pain Assessment: 0-10 Pain Score: 10-Worst pain ever Pain Type: Acute pain Pain Location: Head Pain Descriptors / Indicators: Headache;Throbbing Pain Frequency: Constant Pain Onset: On-going Pain Intervention(s): Medication (See eMAR), RN aware, shower, repositioned ADL: ADL ADL Comments: Please see functional navigator for ADL status  See Function  Navigator for Current Functional Status.   Therapy/Group: Individual Therapy  [name], Maitland Lesiak C 03/09/2017, 7:18 AM

## 2017-03-09 NOTE — Discharge Instructions (Signed)
Inpatient Rehab Discharge Instructions  Sharon Chan Discharge date and time:    Activities/Precautions/ Functional Status: Activity: no lifting, driving, or strenuous exercise  till cleared by MD Diet: diabetic diet and low fat, low cholesterol diet Wound Care: none needed   Functional status:  ___ No restrictions     ___ Walk up steps independently ___ 24/7 supervision/assistance   ___ Walk up steps with assistance ___ Intermittent supervision/assistance  ___ Bathe/dress independently ___ Walk with walker     ___ Bathe/dress with assistance ___ Walk Independently    ___ Shower independently ___ Walk with assistance    ___ Shower with assistance _X__ No alcohol     ___ Return to work/school ________  Special Instructions:   COMMUNITY REFERRALS UPON DISCHARGE:     Outpatient: PT & OT  Agency:[facility] [phone]   Date of Last Service:03/11/2017  Appointment Date/Time: MAY 3 Thursday @ 8:30-10:15 AM (BOTH PT AND OT)  Medical Equipment/Items Ordered:CANE, BEDSIDE COMMODE AND TUB BENCH  Agency/Supplier:[facility]   [phone]   GENERAL COMMUNITY RESOURCES FOR PATIENT/FAMILY: Support Groups:[facility] SUPPORT GROUP EVERY SECOND Thursday @ 3:00-4:00 PM ON THE REHAB UNIT QUESTIONS CONTACT [place] [phone]  STROKE/TIA DISCHARGE INSTRUCTIONS SMOKING Cigarette smoking nearly doubles your risk of having a stroke & is the single most alterable risk factor  If you smoke or have smoked in the last 12 months, you are advised to quit smoking for your health.  Most of the excess cardiovascular risk related to smoking disappears within a year of stopping.  Ask you doctor about anti-smoking medications  [place] Quit Line: 1-800-QUIT NOW  Free Smoking Cessation Classes [phone]  CHOLESTEROL Know your levels; limit fat & cholesterol in your diet  Lipid Panel     Component Value Date/Time   CHOL 199 02/26/2017 0341   TRIG 134 02/26/2017 0341   HDL 37 (L) 02/26/2017 0341   CHOLHDL 5.4 02/26/2017 0341   VLDL 27 02/26/2017 0341   LDLCALC 135 (H) 02/26/2017 0341      Many patients benefit from treatment even if their cholesterol is at goal.  Goal: Total Cholesterol (CHOL) less than 160  Goal:  Triglycerides (TRIG) less than 150  Goal:  HDL greater than 40  Goal:  LDL (LDLCALC) less than 100   BLOOD PRESSURE American Stroke Association blood pressure target is less that 120/80 mm/Hg  Your discharge blood pressure is:  BP: 107/73  Monitor your blood pressure  Limit your salt and alcohol intake  Many individuals will require more than one medication for high blood pressure  DIABETES (A1c is a blood sugar average for last 3 months) Goal HGBA1c is under 7% (HBGA1c is blood sugar average for last 3 months)  Diabetes:   Pre-diabetic  Lab Results  Component Value Date   HGBA1C 6.3 (H) 02/26/2017     Your HGBA1c can be lowered with medications, healthy diet, and exercise.  Check your blood sugar as directed by your physician  Call your physician if you experience unexplained or low blood sugars.  PHYSICAL ACTIVITY/REHABILITATION Goal is 30 minutes at least 4 days per week  Activity: No driving, Therapies: See above Return to work: N/A  Activity decreases your risk of heart attack and stroke and makes your heart stronger.  It helps control your weight and blood pressure; helps you relax and can improve your mood.  Participate in a regular exercise program.  Talk with your doctor about the best form of exercise for you (dancing,  walking, swimming, cycling).  DIET/WEIGHT Goal is to maintain a healthy weight  Your discharge diet is: Diet Heart Room service appropriate? Yes; Fluid consistency: Thin liquids Your height is:  Height:  (160 cm) Your current weight is: Weight: 101.4 kg (223 lb 9.6 oz) Your Body Mass Index (BMI) is:  BMI (Calculated): 39.7  Following the type of diet specifically designed for you will help  prevent another stroke.  Your goal weight is:    Your goal Body Mass Index (BMI) is 19-24.  Healthy food habits can help reduce 3 risk factors for stroke:  High cholesterol, hypertension, and excess weight.  RESOURCES Stroke/Support Group:  Call [phone]   STROKE EDUCATION PROVIDED/REVIEWED AND GIVEN TO PATIENT Stroke warning signs and symptoms How to activate emergency medical system (call 911). Medications prescribed at discharge. Need for follow-up after discharge. Personal risk factors for stroke. Pneumonia vaccine given:  Flu vaccine given:  My questions have been answered, the writing is legible, and I understand these instructions.  I will adhere to these goals & educational materials that have been provided to me after my discharge from the hospital.     My questions have been answered and I understand these instructions. I will adhere to these goals and the provided educational materials after my discharge from the hospital.  Patient/Caregiver Signature _______________________________ Date __________  Clinician Signature _______________________________________ Date __________  Please bring this form and your medication list with you to all your follow-up doctor's appointments.

## 2017-03-09 NOTE — Progress Notes (Signed)
Occupational Therapy Session Note  Patient Details  Name: Sharon Chan MRN: [id number] Date of Birth: 04-13-1976  Today's Date: 03/09/2017 OT Individual Time: 1324-4010 OT Individual Time Calculation (min): 29 min    Skilled Therapeutic Interventions/Progress Updates:    Pt ambulated to the therapy gym for session with min guard assist and no device.  Had pt focus on FM coordination and reaching tasks incorporating medium sized pegs, sorting cards, and catching and tossing a medium sized ball.  She was able to pick up pegs consistently one at a time and manipulate from palm to fingertips with increased time, dropping only 1-2 pegs out of approximately 50.  She was able to catch the medium sized ball with 75% accuracy.  Pt returned back to the room at end of session with family present to complete session.  Family present at end of session as well.     Therapy Documentation Precautions:  Precautions Precautions: Fall Restrictions Weight Bearing Restrictions: No Other Position/Activity Restrictions: R-sided weakness  Vital Signs: Therapy Vitals Temp: 98.6 F (37 C) Temp Source: Oral Pulse Rate: 81 Resp: 18 BP: (!) 148/90 (RN Notified) Patient Position (if appropriate): Sitting Oxygen Therapy SpO2: 99 % O2 Device: Not Delivered Pain: Pain Assessment Pain Assessment: No/denies pain ADL: ADL ADL Comments: Please see functional navigator for ADL status  See Function Navigator for Current Functional Status.   Therapy/Group: Individual Therapy  Allen Basista OTR/L 03/09/2017, 3:49 PM

## 2017-03-09 NOTE — Progress Notes (Signed)
Subjective/Complaints: Discussed d/c date, pt not c/o HA but RN noted c/o overnite.  Discussed CVA etiology and that sickle cell trait was not etiology  ROS-  Denies CP,SOB, -N/V/D, no pain with urination Objective: Vital Signs: Blood pressure 118/78, pulse 70, temperature 98.4 F (36.9 C), temperature source Oral, resp. rate 18, height 5' 3"  (1.6 m), weight 110.1 kg (242 lb 11.6 oz), SpO2 98 %. No results found. Results for orders placed or performed during the hospital encounter of 03/02/17 (from the past 72 hour(s))  Basic metabolic panel     Status: Abnormal   Collection Time: 03/08/17  7:41 AM  Result Value Ref Range   Sodium 138 135 - 145 mmol/L   Potassium 3.5 3.5 - 5.1 mmol/L   Chloride 106 101 - 111 mmol/L   CO2 24 22 - 32 mmol/L   Glucose, Bld 103 (H) 65 - 99 mg/dL   BUN 13 6 - 20 mg/dL   Creatinine, Ser 0.88 0.44 - 1.00 mg/dL   Calcium 9.2 8.9 - 10.3 mg/dL   GFR calc non Af Amer >60 >60 mL/min   GFR calc Af Amer >60 >60 mL/min    Comment: (NOTE) The eGFR has been calculated using the CKD EPI equation. This calculation has not been validated in all clinical situations. eGFR's persistently <60 mL/min signify possible Chronic Kidney Disease.    Anion gap 8 5 - 15  CBC     Status: Abnormal   Collection Time: 03/08/17  7:41 AM  Result Value Ref Range   WBC 6.1 4.0 - 10.5 K/uL   RBC 5.21 (H) 3.87 - 5.11 MIL/uL   Hemoglobin 13.0 12.0 - 15.0 g/dL   HCT 39.4 36.0 - 46.0 %   MCV 75.6 (L) 78.0 - 100.0 fL   MCH 25.0 (L) 26.0 - 34.0 pg   MCHC 33.0 30.0 - 36.0 g/dL   RDW 15.6 (H) 11.5 - 15.5 %   Platelets 321 150 - 400 K/uL  Occult blood card to lab, stool     Status: None   Collection Time: 03/08/17  7:16 PM  Result Value Ref Range   Fecal Occult Bld NEGATIVE NEGATIVE     HEENT: no facial assymetry, eyes non injected, no ptosis Cardio: RRR and no murmur or ES Resp: CTA B/L and unlabored GI: BS positive and NT, ND Extremity:  BS positive and NT, ND Skin:    Intact Neuro: Alert/Oriented, Cranial Nerve II-XII normal, Abnormal Sensory reduced LT sensation on right side face V1,2,3, reduced RUE and RLE, Abnormal Motor 4/5 in RUE and RLE, 5/5 on left and Abnormal FMC Ataxic/ dec FMC, difficulty touching thumb to 4th and 5th digit Right neglect on visual confrontation C/o "double vision"  With Left lateral gaze even when closing one eye Musc/Skel:  Other no pain with LUE or LLE ROM Gen NAD   Assessment/Plan: 1. Functional deficits secondary to Left thalamic infarct causing right hemiparesis and sensory deficits which require 3+ hours per day of interdisciplinary therapy in a comprehensive inpatient rehab setting. Physiatrist is providing close team supervision and 24 hour management of active medical problems listed below. Physiatrist and rehab team continue to assess barriers to discharge/monitor patient progress toward functional and medical goals. FIM: Function - Bathing Position: Shower Body parts bathed by patient: Right arm, Left arm, Chest, Abdomen, Front perineal area, Right upper leg, Left upper leg, Right lower leg, Left lower leg, Buttocks, Back Body parts bathed by helper: Buttocks, Back Assist Level: More than reasonable  time  Function- Upper Body Dressing/Undressing What is the patient wearing?: Pull over shirt/dress, Bra Bra - Perfomed by patient: Thread/unthread right bra strap, Thread/unthread left bra strap, Hook/unhook bra (pull down sports bra) Bra - Perfomed by helper: Hook/unhook bra (pull down sports bra) Pull over shirt/dress - Perfomed by patient: Thread/unthread left sleeve, Put head through opening, Thread/unthread right sleeve, Pull shirt over trunk Pull over shirt/dress - Perfomed by helper: Thread/unthread right sleeve, Pull shirt over trunk Assist Level: More than reasonable time Function - Lower Body Dressing/Undressing What is the patient wearing?: Pants, Socks, Shoes, Underwear Position: Other (comment)  Environmental manager) Underwear - Performed by patient: Thread/unthread right underwear leg, Thread/unthread left underwear leg, Pull underwear up/down Pants- Performed by patient: Thread/unthread left pants leg, Thread/unthread right pants leg, Pull pants up/down Pants- Performed by helper: Thread/unthread right pants leg, Pull pants up/down Non-skid slipper socks- Performed by patient: Don/doff right sock, Don/doff left sock Socks - Performed by patient: Don/doff right sock, Don/doff left sock Shoes - Performed by patient: Don/doff right shoe, Don/doff left shoe, Fasten right, Fasten left Assist for footwear: Setup Assist for lower body dressing: Supervision or verbal cues  Function - Toileting Toileting activity did not occur: No continent bowel/bladder event Toileting steps completed by patient: Adjust clothing prior to toileting, Performs perineal hygiene, Adjust clothing after toileting Toileting steps completed by helper: Adjust clothing after toileting Toileting Assistive Devices: Grab bar or rail Assist level: Supervision or verbal cues  Function Midwife transfer assistive device: Grab bar Assist level to toilet: Supervision or verbal cues Assist level from toilet: Supervision or verbal cues  Function - Chair/bed transfer Chair/bed transfer method: Ambulatory Chair/bed transfer assist level: Supervision or verbal cues Chair/bed transfer assistive device: Armrests, Walker Chair/bed transfer details: Manual facilitation for weight shifting, Tactile cues for initiation, Tactile cues for weight shifting, Verbal cues for sequencing, Verbal cues for technique  Function - Locomotion: Wheelchair Will patient use wheelchair at discharge?: No Max wheelchair distance: 125 Assist Level: Supervision or verbal cues Assist Level: Supervision or verbal cues Wheel 150 feet activity did not occur: Safety/medical concerns Turns around,maneuvers to table,bed, and toilet,negotiates 3%  grade,maneuvers on rugs and over doorsills: No Function - Locomotion: Ambulation Assistive device: No device Max distance: 100 Assist level: Touching or steadying assistance (Pt > 75%) Assist level: Touching or steadying assistance (Pt > 75%) Walk 50 feet with 2 turns activity did not occur: Safety/medical concerns Assist level: Touching or steadying assistance (Pt > 75%) Walk 150 feet activity did not occur: Safety/medical concerns Walk 10 feet on uneven surfaces activity did not occur: Safety/medical concerns  Function - Comprehension Comprehension: Auditory Comprehension assist level: Follows complex conversation/direction with extra time/assistive device  Function - Expression Expression: Verbal Expression assist level: Expresses complex ideas: With extra time/assistive device  Function - Social Interaction Social Interaction assist level: Interacts appropriately with others with medication or extra time (anti-anxiety, antidepressant).  Function - Problem Solving Problem solving assist level: Solves complex problems: With extra time  Function - Memory Memory assist level: More than reasonable amount of time Patient normally able to recall (first 3 days only): Location of own room, Current season, Staff names and faces, That he or she is in a hospital  Medical Problem List and Plan: 1.  Gait abnormality, limitations with self-care secondary to left thalamus infarct.  Cont CIR PT, OT 2.  DVT Prophylaxis/Anticoagulation: Pharmaceutical: Lovenox 3. Pain Management: Will change oxycodone to ultram prn for joint pain/aches.  increase topamax for  HA, first dose last noc, increase tramadol until that takes effect 4. Mood: team to provide ego support to help with adjustment reaction. LCSW to follow for evaluation and support.  5. Neuropsych: This patient is capable of making decisions on her own behalf. 6. Skin/Wound Care: routine pressure relief measures. 7.  Fluids/Electrolytes/Nutrition: Monitor I/O. Check lytes in am. Offer supplements between meals as reports poor intake.  8. HTN: Permissive HTN  Will add medication as indicated. Cont to monitor for now Vitals:   03/08/17 2035 03/09/17 0628  BP: 125/89 118/78  Pulse: 75 70  Resp: 18 18  Temp: 98.2 F (36.8 C) 98.4 F (36.9 C)   9. Dyslipidemia: On Lipitor.  10.Sickle Cell trait: Iron studies WNL. She does not have primary MD--will need PMD at discharge per Dr. Zigmund Daniel.  Hematology consult appreciated,per Heme CVA etiology likely not sickle cell trait related Rheum f/u as OP  11. Thyroid nodule: Follow up with primary MD for workup. TSH WNL 12.  Visual affects of CVA not a convergence issue, no field cut but has some Right hemispatial neglect, patching will not help  13.  Diffuse intracranial atherosclerosis- cont ASA 32m  Moderate or severe stenoses of the: - Left PCA P1 and P2, - Right MCA origin and M1, - Left ACA A2 and bilateral distal pericallosal arteries, - Left MCA M2 and M3. 14.  Poor appetite, may be mood disorder related to CVA, vs Side effect of topamax, supplement with boost, given obesity long range goal is weight loss but does need adequate nutrition for acute recovery- intake 10-75% of meals LOS (Days) 7 A FACE TO FACE EVALUATION WAS PERFORMED  KIRSTEINS,ANDREW E 03/09/2017, 7:43 AM

## 2017-03-09 NOTE — Progress Notes (Signed)
Spoke with Dr. Jacqualyn Posey office. They need referral faxed. MD will review notes and office will call patient to set up appointment (will likely be in mid June)

## 2017-03-09 NOTE — Progress Notes (Signed)
Speech Language Pathology Daily Session Note  Patient Details  Name: Sharon Chan MRN: [id number] Date of Birth: 10-Jun-1976  Today's Date: 03/09/2017   Treatment session #1 -  SLP Individual Time: 7253-6644 SLP Individual Time Calculation (min): 30 min   Treatment session #2 -  SLP Individual Time: 1400-1430  SLP Individual Time Calculation (min): 30 min  Short Term Goals: Week 1: SLP Short Term Goal 1 (Week 1): Pt will utilize word finding strategies at the unstructured simple conversation level with supervision cues.  SLP Short Term Goal 2 (Week 1): Pt will self-monitor and self-correct expressive communication with supervision cues.   Skilled Therapeutic Interventions:  Skilled treatment session #1 - focused on speech intelligibility goals. SLP facilitated session by providing Mod I for utilization of word finding strategies within anxious environments by taking pt to cafeteria to order snacks. Pt was appropriate and no issues of word finding noted. Pt very encouargement, returned to room, left in recliner and all needs within reach.   Skilled treatment session #2 - focused on speech intelligibility goals. SLP facilitated session by providing Mod I for utilization of word finding strategies within emotional topics (recent verdict in [place] case). Pt with no evidence of word finding deficits. Pt left upright in recliner and all needs within reach. Continue per current plan of care.      Function:  Eating Eating   Modified Consistency Diet: No Eating Assist Level: No help, No cues           Cognition Comprehension Comprehension assist level: Follows complex conversation/direction with extra time/assistive device  Expression   Expression assist level: Expresses complex ideas: With extra time/assistive device  Social Interaction Social Interaction assist level: Interacts appropriately with others with medication or extra time (anti-anxiety, antidepressant).  Problem  Solving Problem solving assist level: Solves complex problems: With extra time  Memory Memory assist level: More than reasonable amount of time    Pain Pain Assessment Pain Assessment: No/denies pain  Therapy/Group: Individual Therapy   [name], [name], [name] Speech-Language Pathologist   [name] 03/09/2017, 4:18 PM

## 2017-03-09 NOTE — Progress Notes (Signed)
Occupational Therapy Discharge Summary  Patient Details  Name: Sharon Chan MRN: [id number] Date of Birth: January 29, 1976  Skilled OT Intervention: 1:1. Pt c/o headache. RN aware and administers medication. Pt ambulates throughout session wit SPC with MOD I. Pt gathers clothes and showers seated on shower chair in walk in shower. Pt decline opportunity to bathe in tub room to simulate home environment. Pt bathes 10/10 body parts with MOD I. Pt dons UB, LB clothing, and footwear with MOD I while seated on toilet using grab bar. Discuss fall risk with pt as pt says she usually dons B socks/pants in standing. Educated pt on donning LB clothing in seated position to decrease fall risk. Pt brushes teeth in standing with MOD I. Pt ambulates without AD with supervision to/from ADL apartment with VC for foot clearance. Pt transfers onto TTB in tub room with VC for technique as pt attempts to step over ledge. Pt return demo tub transfer second time without VC. Pt transfers on/off regular bed with MOD I. OT recommended pt sleep in middle of bed d/t decreased feeling to keep pt from rolling off edge of bed in sleep. Pt ambulates throughout kitchen scanning for food items hidden at various heights throughout session with MOD I. PT able to demo good scanning strategies and finds all fruit without VC. Exited session with pt seated in recliner with call light in reach and RN in room.   Patient has met 14 of 14 long term goals due to improved activity tolerance, improved balance, postural control, ability to compensate for deficits, functional use of  RIGHT upper extremity and improved coordination.  Patient to discharge at overall Modified Independent level.  Patient's care partner is independent to provide the necessary physical assistance at discharge.  Pt with plans to d/c home where her mother plans to spend the first few days with her. Mother present throughout CIR stay and aware of pt's current level of  function. Extensive education provided throughout stay regarding reducing fall risk and importance of participation and independence with ADLs/IADLs. Pt voices feeling comfortable and confident with planned d/c home mod I level.  Recommending use of tub transfer bench for seated bathing tasks and transfer method.    Recommendation:  Patient will benefit from ongoing skilled OT services in outpatient setting to continue to advance functional skills in the area of BADL, iADL, Vocation and Reduce care partner burden.  Equipment: Tub transfer bench, SPC  Reasons for discharge: treatment goals met and discharge from hospital  Patient/family agrees with progress made and goals achieved: Yes  OT Discharge Precautions/Restrictions  Precautions Precautions: Fall Restrictions Weight Bearing Restrictions: No Other Position/Activity Restrictions: R-sided weakness General   Vital Signs   Pain Pain Assessment Pain Assessment: 0-10 Pain Score: 4  Faces Pain Scale: Hurts a little bit Pain Type: Acute pain Pain Location: Head Pain Descriptors / Indicators: Headache Pain Intervention(s): Medication (See eMAR) ADL ADL ADL Comments: Please see functional navigator for ADL status Vision/Perception  Vision- Assessment Eye Alignment: Within Functional Limits Ocular Range of Motion: Within Functional Limits Alignment/Gaze Preference: Within Defined Limits Tracking/Visual Pursuits: Decreased smoothness of horizontal tracking;Decreased smoothness of eye movement to LEFT inferior field;Decreased smoothness of eye movement to LEFT superior field;Decreased smoothness of vertical tracking;Decreased smoothness of eye movement to RIGHT superior field;Decreased smoothness of eye movement to RIGHT inferior field Additional Comments: mild R periphreal visual impairment.  Perception Perception: Within Functional Limits Praxis Praxis: Intact  Cognition Overall Cognitive Status: Within Functional Limits  for tasks  assessed Arousal/Alertness: Awake/alert Orientation Level: Oriented X4 Attention: Sustained Sustained Attention: Appears intact Selective Attention: Appears intact Memory: Appears intact Awareness: Appears intact Problem Solving: Appears intact Safety/Judgment: Appears intact Sensation Sensation Light Touch: Appears Intact Light Touch Impaired Details: Impaired RUE;Impaired LUE Stereognosis: Not tested Hot/Cold: Appears Intact Proprioception: Appears Intact Proprioception Impaired Details: Impaired RUE Additional Comments: able to appreciate and localize all light tough and deep tissue testing in BLE Coordination Gross Motor Movements are Fluid and Coordinated: Yes Fine Motor Movements are Fluid and Coordinated: Yes Finger Nose Finger Test: Dysmetria R UE Heel Shin Test: [facility] Motor  Motor Motor: Hemiplegia Motor - Skilled Clinical Observations: R weakness Motor - Discharge Observations: mild decreased strength in RLE/UE, significantly improved coordination and strength from evaluation Mobility  Bed Mobility Bed Mobility: Rolling Right;Rolling Left;Supine to Sit;Sit to Supine Rolling Right: 6: Modified independent (Device/Increase time) Rolling Left: 6: Modified independent (Device/Increase time) Supine to Sit: 6: Modified independent (Device/Increase time) Sit to Supine: 6: Modified independent (Device/Increase time) Transfers Sit to Stand: 6: Modified independent (Device/Increase time) Stand to Sit: 6: Modified independent (Device/Increase time)  Trunk/Postural Assessment  Cervical Assessment Cervical Assessment: Within Functional Limits Thoracic Assessment Thoracic Assessment: Within Functional Limits Lumbar Assessment Lumbar Assessment: Within Functional Limits Postural Control Postural Control: Within Functional Limits  Balance Balance Balance Assessed: Yes Dynamic Sitting Balance Dynamic Sitting - Balance Support: Feet supported Dynamic Sitting -  Level of Assistance: 6: Modified independent (Device/Increase time) Sitting balance - Comments: Bathing on tub bench Static Standing Balance Static Standing - Balance Support: Right upper extremity supported Static Standing - Level of Assistance: 6: Modified independent (Device/Increase time) Static Standing - Comment/# of Minutes: standing during bathing, UE on grab bar Dynamic Standing Balance Dynamic Standing - Balance Support: Right upper extremity supported Dynamic Standing - Level of Assistance: 6: Modified independent (Device/Increase time) Dynamic Standing - Balance Activities: Lateral lean/weight shifting;Forward lean/weight shifting;Reaching for objects;Reaching across midline Dynamic Standing - Comments: peri care/buttocks in shower Extremity/Trunk Assessment RUE Assessment RUE Assessment: Exceptions to [facility] RUE Strength RUE Overall Strength Comments: generalized weakness LUE Assessment LUE Assessment: Exceptions to [facility] (generalized weakness)   See Function Navigator for Current Functional Status.  Bobby Rumpf, Amy C 03/09/2017, 3:28 PM

## 2017-03-10 ENCOUNTER — Inpatient Hospital Stay (HOSPITAL_COMMUNITY): Payer: Medicare Other | Admitting: Physical Therapy

## 2017-03-10 ENCOUNTER — Inpatient Hospital Stay (HOSPITAL_COMMUNITY): Payer: Medicare Other

## 2017-03-10 ENCOUNTER — Inpatient Hospital Stay (HOSPITAL_COMMUNITY): Payer: Medicare Other | Admitting: Speech Pathology

## 2017-03-10 DIAGNOSIS — R51 Headache: Secondary | ICD-10-CM

## 2017-03-10 MED ORDER — CYCLOBENZAPRINE HCL 5 MG PO TABS: 5.0000 mg | ORAL_TABLET | Freq: Three times a day (TID) | ORAL | 0 refills | Status: DC | PRN

## 2017-03-10 MED ORDER — SENNOSIDES-DOCUSATE SODIUM 8.6-50 MG PO TABS: 2.0000 | ORAL_TABLET | Freq: Every day | ORAL | 0 refills | Status: DC

## 2017-03-10 MED ORDER — TRAMADOL HCL 50 MG PO TABS: 50.0000 mg | ORAL_TABLET | Freq: Four times a day (QID) | ORAL | 0 refills | Status: DC | PRN

## 2017-03-10 MED ORDER — ASPIRIN 325 MG PO TABS: 325.0000 mg | ORAL_TABLET | Freq: Every day | ORAL | 1 refills | Status: DC

## 2017-03-10 MED ORDER — ATORVASTATIN CALCIUM 40 MG PO TABS: 40.0000 mg | ORAL_TABLET | Freq: Every day | ORAL | 0 refills | Status: DC

## 2017-03-10 MED ORDER — TOPIRAMATE 100 MG PO TABS: 100.0000 mg | ORAL_TABLET | Freq: Two times a day (BID) | ORAL | 0 refills | Status: DC

## 2017-03-10 MED ORDER — TRAMADOL HCL 50 MG PO TABS
50.0000 mg | ORAL_TABLET | Freq: Four times a day (QID) | ORAL | Status: DC | PRN
  Administered 2017-03-10 – 2017-03-11 (×2): 50 mg via ORAL
  Filled 2017-03-10 (×2): qty 1

## 2017-03-10 MED ORDER — NAPHAZOLINE-GLYCERIN 0.012-0.2 % OP SOLN: 1.0000 [drp] | Freq: Three times a day (TID) | OPHTHALMIC | 0 refills | Status: DC

## 2017-03-10 MED ORDER — VITAMIN B-12 100 MCG PO TABS: 100.0000 ug | ORAL_TABLET | Freq: Every day | ORAL | 0 refills | Status: DC

## 2017-03-10 MED ORDER — TOPIRAMATE 100 MG PO TABS
100.0000 mg | ORAL_TABLET | Freq: Two times a day (BID) | ORAL | Status: DC
  Administered 2017-03-10 – 2017-03-11 (×3): 100 mg via ORAL
  Filled 2017-03-10 (×3): qty 1

## 2017-03-10 NOTE — Progress Notes (Signed)
Physical Therapy Discharge Summary  Patient Details  Name: KIMBERLIN SCHEEL MRN: [id number] Date of Birth: 11-17-1975  Today's Date: 03/10/2017 PT Individual Time: [phone] AND 1415-1440 24mn AND 25 min       Patient has met 10 of 10 long term goals due to improved activity tolerance, improved balance, improved postural control, increased strength, increased range of motion, ability to compensate for deficits, functional use of  right upper extremity and right lower extremity and improved coordination.  Patient to discharge at an ambulatory level Modified Independent.   Patient's care partner is independent to provide the necessary physical assistance at discharge.  Reasons goals not met: all rehab goals met   Recommendation:  Patient will benefit from ongoing skilled PT services in outpatient setting to continue to advance safe functional mobility, address ongoing impairments in balance, strength, safety with gait, and minimize fall risk.  [facility] Reasons for [facility]: treatment goals met and discharge from hospital  Patient/family agrees with progress made and goals achieved: Yes   PT Treatment:  Pt received sitting in WC and agreeable to PT. PT instructed pt in grad day assessment to measure progress towards goal, see below  For details. Pt also instructed pt in HEP for modified [place] exercise program level B. Patient returned too room and [facility] reach and all needs met.    Session 2.  Pt received sitting in WC and agreeable to PT Pt transported pt to main entrance of hospital [facility] Gait training instructed by PT over uneven cement sidewalk x [phone] supervision assist from PT and LUE support from SPProfessional [facility] Associates IncMin cues for safety in turns. Pt noted to have mild circumduction on the R LE. Patient returned too room and left [facility] in [facility] in reach and all needs met.        PT  Discharge Precautions/Restrictions Precautions Precautions: Fall Restrictions Weight Bearing Restrictions: No Other Position/Activity Restrictions: R-sided weakness Vital Signs   Pain Pain Assessment Pain Assessment: 0-10 Pain Score: 4  Faces Pain Scale: Hurts a little bit Pain Type: Acute pain Pain Location: Head Pain Descriptors / Indicators: Headache Pain Intervention(s): Medication (See eMAR) Vision/Perception  Vision - History Baseline Vision: No visual deficits Patient Visual Report: Blurring of vision Vision - Assessment Eye Alignment: Within Functional Limits Vision Assessment: Vision tested Ocular Range of Motion: Within Functional Limits Alignment/Gaze Preference: Within Defined Limits Tracking/Visual Pursuits: Decreased smoothness of horizontal tracking;Decreased smoothness of eye movement to LEFT inferior field;Decreased smoothness of eye movement to LEFT superior field;Decreased smoothness of vertical tracking;Decreased smoothness of eye movement to RIGHT superior field;Decreased smoothness of eye movement to RIGHT inferior field Additional Comments: mild R periphreal visual impairment.  Perception Perception: Within Functional Limits Praxis Praxis: Intact  Cognition Overall Cognitive Status: Within Functional Limits for tasks assessed Arousal/Alertness: Awake/alert Orientation Level: Oriented X4 Attention: Sustained Sustained Attention: Appears intact Selective Attention: Appears intact Memory: Appears intact Awareness: Appears intact Problem Solving: Appears intact Safety/Judgment: Appears intact Sensation Sensation Light Touch: Appears Intact Light Touch Impaired Details: Impaired RUE;Impaired LUE Stereognosis: Not tested Hot/Cold: Appears Intact Proprioception: Appears Intact Proprioception Impaired Details: Impaired RUE Additional Comments: able to appreciate and localize all light tough and deep tissue testing in BLE Coordination Gross Motor  Movements are Fluid and Coordinated: Yes Fine Motor Movements are Fluid and Coordinated: Yes Finger Nose Finger Test: Dysmetria R UE Heel Shin Test: [facility]  Motor Motor: Hemiplegia Motor -  Skilled Clinical Observations: R weakness Motor - Discharge Observations: mild decreased strength in RLE/UE, significantly improved coordination and strength from evaluation  Mobility Bed Mobility Bed Mobility: Rolling Right;Rolling Left;Supine to Sit;Sit to Supine Rolling Right: 6: Modified independent (Device/Increase time) Rolling Left: 6: Modified independent (Device/Increase time) Supine to Sit: 6: Modified independent (Device/Increase time) Sit to Supine: 6: Modified independent (Device/Increase time) Transfers Transfers: Yes Sit to Stand: 6: Modified independent (Device/Increase time) Stand to Sit: 6: Modified independent (Device/Increase time) Stand Pivot Transfers: 6: Modified independent (Device/Increase time) Locomotion  Ambulation Ambulation: Yes Ambulation/Gait Assistance: 6: Modified independent (Device/Increase time) Ambulation Distance (Feet): 200 Feet Assistive device: Straight cane Ambulation/Gait Assistance Details: mild decreased step length on the LLE as well as intermittent circumduction Gait Gait: Yes Gait Pattern: Step-through pattern;Wide base of support;Decreased step length - right Stairs / Additional Locomotion Stairs: Yes Stairs Assistance: 5: Supervision Stair Management Technique: No rails;One rail Right Number of Stairs: 12 Height of Stairs: 6 Ramp: 6: Modified independent (Device) Curb: 6: Modified independent (Device/increase time) Wheelchair Mobility Wheelchair Mobility: No  Trunk/Postural Assessment  Cervical Assessment Cervical Assessment: Within Functional Limits Thoracic Assessment Thoracic Assessment: Within Functional Limits Lumbar Assessment Lumbar Assessment: Within Functional Limits Postural Control Postural Control: Within Functional  Limits  Balance Balance Balance Assessed: Yes Standardized Balance Assessment Standardized Balance Assessment: Berg Balance Test Berg Balance Test Sit to Stand: Able to stand without using hands and stabilize independently Standing Unsupported: Able to stand safely 2 minutes Sitting with Back Unsupported but Feet Supported on Floor or Stool: Able to sit safely and securely 2 minutes Stand to Sit: Sits safely with minimal use of hands Transfers: Able to transfer safely, definite need of hands Standing Unsupported with Eyes Closed: Able to stand 10 seconds safely Standing Ubsupported with Feet Together: Able to place feet together independently and stand 1 minute safely From Standing, Reach Forward with Outstretched Arm: Can reach confidently >25 cm (10") From Standing Position, Pick up Object from Floor: Able to pick up shoe safely and easily From Standing Position, Turn to Look Behind Over each Shoulder: Looks behind from both sides and weight shifts well Turn 360 Degrees: Able to turn 360 degrees safely one side only in 4 seconds or less Standing Unsupported, Alternately Place Feet on Step/Stool: Able to stand independently and safely and complete 8 steps in 20 seconds Standing Unsupported, One Foot in Front: Able to place foot tandem independently and hold 30 seconds Standing on One Leg: Able to lift leg independently and hold 5-10 seconds Total Score: 53 Dynamic Sitting Balance Dynamic Sitting - Balance Support: Feet supported Dynamic Sitting - Level of Assistance: 6: Modified independent (Device/Increase time) Dynamic Sitting - Balance Activities: Reaching for objects Sitting balance - Comments: Bathing on tub bench Static Standing Balance Static Standing - Balance Support: Right upper extremity supported Static Standing - Level of Assistance: 6: Modified independent (Device/Increase time) Static Standing - Comment/# of Minutes: standing during bathing, UE on grab bar Dynamic  Standing Balance Dynamic Standing - Balance Support: Right upper extremity supported Dynamic Standing - Level of Assistance: 6: Modified independent (Device/Increase time) Dynamic Standing - Balance Activities: Reaching for objects;Reaching across midline Dynamic Standing - Comments: peri care/buttocks in shower Extremity Assessment  RUE Assessment RUE Assessment: Exceptions to [facility] RUE Strength RUE Overall Strength Comments: generalized weakness LUE Assessment LUE Assessment: Exceptions to [facility] (generalized weakness) RLE Assessment RLE Assessment: Exceptions to [facility] RLE Strength RLE Overall Strength Comments: 4+/5 proximal to distal except ankle PF 4-/5 and hip  flexion 4/5  LLE Assessment LLE Assessment: Within Functional Limits   See Function Navigator for Current Functional Status.  Lorie Phenix 03/10/2017, 9:46 AM

## 2017-03-10 NOTE — Progress Notes (Signed)
Social Work Patient ID: Sharon Chan, female   DOB: [birth year], 41 y.o.   MRN: [id number] gave directions for OP therapies to pt along with the appointment times. She is doing well and feels prepared to go home tomorrow. Pam-PA to go over discharge instructions later today. [facility] @ 2:00 pm.

## 2017-03-10 NOTE — Progress Notes (Signed)
Social Work  Discharge Note  The overall goal for the admission was met for:   Discharge location: Yes-HOME WITH MOM STAYING WITH HER A SHORT TIME  Length of Stay: Yes-9 DAYS  Discharge activity level: Yes-MOD/I LEVEL  Home/community participation: Yes  Services provided included: MD, RD, PT, OT, SLP, RN, CM, TR, Pharmacy, Neuropsych and SW  Financial Services: Medicare and Medicaid  Follow-up services arranged: Outpatient: [facility] OT 5/3 8:30-11:00 AM, DME: [facility], 3IN1, TUB BENCH and Patient/Family has no preference for HH/DME agencies  Comments (or additional information):MOM TO STAY WITH HER FOR A FEW DAYS TO MAKE SURE [place] IS SMOOTH  Patient/Family verbalized understanding of follow-up arrangements: Yes  Individual responsible for coordination of the follow-up plan: SELF & JACKIE-MOM  Confirmed correct DME delivered: Elease Hashimoto 03/10/2017    Elease Hashimoto

## 2017-03-10 NOTE — Progress Notes (Signed)
Speech Language Pathology Discharge Summary  Patient Details  Name: Sharon Chan MRN: [id number] Date of Birth: 1976-05-09  Today's Date: 03/10/2017   Skilled treatment session #1 SLP Individual Time: 0830-0900 SLP Individual Time Calculation (min): 30 min   Skilled treatment session #2 SLP Individual Time: 1445-1515 SLP Individual Time Calculation (min): 30 min   Skilled Therapeutic Interventions:    Skilled treatment session #1 - focused on speech communication goals. SLP facilitated session by providing unstructured complex conversational targets. Pt able to utilize word finding strategies without any cues. No word finding deficits evident. Pt pleased with progress. Pt left in recliner with all needs within reach.   Skilled treatment session #2 - focused on speech communication goals. Pt able to converse and convey information without evidence of word finding deficits. All questions answered to pt satisfaction.     Patient has met 1 of 1 long term goals.  Patient to discharge at overall Independent level.  Reasons goals not met:     Clinical Impression/Discharge Summary:   Pt has made great progress in ST sessions and as a result she no longer has evidence of any expressive difficulties.   Care Partner:  Caregiver Able to Provide Assistance: Yes     Recommendation:  None      Equipment: N/A   Reasons for discharge: Treatment goals met   Patient/Family Agrees with Progress Made and Goals Achieved: Yes   Function:  Eating Eating   Modified Consistency Diet: No Eating Assist Level: No help, No cues           Cognition Comprehension Comprehension assist level: Follows complex conversation/direction with extra time/assistive device  Expression   Expression assist level: Expresses complex ideas: With extra time/assistive device  Social Interaction Social Interaction assist level: Interacts appropriately with others with medication or extra time  (anti-anxiety, antidepressant).  Problem Solving Problem solving assist level: Solves complex problems: With extra time  Memory Memory assist level: More than reasonable amount of time   Adlean Hardeman B. Rutherford Nail, [name], [place] 03/10/2017, 16:14

## 2017-03-10 NOTE — Discharge Summary (Signed)
Physician Discharge Summary  Patient ID: Sharon Chan MRN: [id number] DOB/AGE: 1976/02/05 41 y.o.  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 [facility]) Active Problems:   Benign essential HTN   Acute intractable headache   Hyperlipidemia   Sickle cell trait (HCC)   Visual disturbance as complication of stroke   Adjustment disorder with anxious mood   Discharged Condition: stable   Significant Diagnostic Studies: N/A   Labs:  Basic Metabolic Panel: BMP Latest Ref Rng & Units 03/08/2017 03/03/2017 02/27/2017  Glucose 65 - 99 mg/dL 784(O) 962(X) 528(U)  BUN 6 - 20 mg/dL Creatinine 0.44 - 1.00 mg/dL 1.32 4.40 1.02  Sodium 135 - 145 mmol/L 138 138 137  Potassium 3.5 - 5.1 mmol/L 3.5 3.5 3.7  Chloride 101 - 111 mmol/L 106 105 101  CO2 22 - 32 mmol/L Calcium 8.9 - 10.3 mg/dL 9.2 8.9 9.3    CBC: CBC Latest Ref Rng & Units 03/08/2017 03/03/2017 03/01/2017  WBC 4.0 - 10.5 K/uL 6.1 7.7 8.7  Hemoglobin 12.0 - 15.0 g/dL 72.5 36.6 11.8(L)  Hematocrit 36.0 - 46.0 % 39.4 36.5 35.7(L)  Platelets 150 - 400 K/uL 321 264 285    CBG: No results for input(s): GLUCAP in the last 168 hours.   Brief HPI:    Sharon Diveley Richardsonis a 41 y.o. left handed female with reported history of SSD, PCOS who was admitted on 02/25/17 with RLE weakness, elevated and numbness. Blood pressures elevated requiring IV labetalol. 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  CTA head/neck done revealing widespread moderate severe stenosis , Bilateral PCA, left MCA, left ACA and 18 mm left thyroid nodule--non-emergent follow up recommended.  HIV and RPR negative. 2 D echo with EF 60-65% with no wall abnormality.  Dr. 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 Question of JRA history. Coagulopathy panel ordered today for work up. She reported decrease in vision with waxing and waning of vision therefore opthalmology consulted for input.  Exam by Dr. Randon Goldsmith was negative for acute findings and patient to follow up on outpatient basis.  Dr. Pearlean Brownie feels that stroke due small vessel disease v/s vasculopathy and patient on ASA for secondary stroke prevention. CIR recommended due to deficits in mobility and ability to carry out ADL tasks.    Hospital Course: Sharon Chan was admitted to rehab 03/02/2017 for inpatient therapies to consist of PT, ST and OT at least three hours five days a week. Past admission physiatrist, therapy team and rehab RN have worked together to provide customized collaborative inpatient rehab. Blood pressures have been monitored bid with permissive hypertension initially. She reported persistent admission at admission and has required tramadol for pain relief. Topamax was added and titrated upwards with improvement in symptoms. Neck pain has been managed with use of flexeril. Po intake has been good and she is continent of bowel and bladder. Constipation has been managed with initiation of bowel program.  Dr. Truett Perna has followed for input on further work up.  Blood work was ordered for gene mapping and is currently pending. He plans on consulting hematology at [facility] for input and will follow up with patient after discharge.   Referral has been sent to Dr.  Deveshwar who will evaluate patient's chart and office to contact patient with appointment for work up on vasculopathy.   Dr. Kieth Brightly has followed for support and instruction on coping skills to help manage anxiety and fear regarding medical issues.  She has made great progress and is modified independent at discharge. She will continue to receive follow up outpatient PT and OT at [facility] after  discharge.    Rehab course: During patient's stay in rehab weekly team conferences were held to monitor patient's progress, set goals and discuss barriers to discharge. At admission, patient required mod assist with mobility and basic ADL tasks. She exhibited mild expressive aphasia with cognition WML-MoCA blind score 19/22. She has had improvement in activity tolerance, balance, postural control, as well as ability to compensate for deficits. She is able to utilize speech strategies without cues and no word finding deficits evident at discharge.she is independent for speech/communication and no speech therapy needed after discharge.  She is modified independent for mobility and to complete ADL tasks. Family education was completed with mother who plans to stay and help patient transition to home after hospital stay.     Disposition: 01-Home or Self Care  Diet: Diabetic/Heart Healthy.   Special Instructions: 1. Will need non-emergent work up of left thyroid nodule.  2. No driving till cleared by MD.    Discharge Instructions    Ambulatory referral to Physical Medicine Rehab    Complete by:  As directed    1-2 weeks transitional care appt   Ambulatory referral to Rheumatology    Complete by:  As directed    Recent stroke. Need vasculopathy work up.     Allergies as of 03/10/2017   No Known Allergies     Medication List    TAKE these medications   acetaminophen 325 MG tablet Commonly known as:  TYLENOL Take 1-2 tablets (325-650 mg total) by mouth every 4 (four) hours as needed for mild pain.   aspirin 325 MG tablet Take 1 tablet (325 mg total) by mouth daily.   atorvastatin 40 MG tablet Commonly known as:  LIPITOR Take 1 tablet (40 mg total) by mouth daily at 6 PM.   cyclobenzaprine 5 MG tablet--Rx # 45 Commonly known as:  FLEXERIL Take 1 tablet (5 mg total) by mouth 3 (three) times daily as needed for muscle spasms. Shoulder/neck pain   MAGNESIUM PO Take 1 tablet by mouth  at bedtime.   naphazoline-glycerin 0.012-0.2 % Soln Commonly known as:  CLEAR EYES Place 1-2 drops into both eyes 4 (four) times daily -  with meals and at bedtime.   senna-docusate 8.6-50 MG tablet Commonly known as:  Senokot-S Take 2 tablets by mouth at bedtime.   topiramate 100 MG tablet Commonly known as:  TOPAMAX Take 1 tablet (100 mg total) by mouth 2 (two) times daily.   traMADol 50 MG tablet--Rx # 150 pills  Commonly known as:  ULTRAM Take 1-2 tablets (50-100 mg total) by mouth every 6 (six) hours as needed for moderate pain.   vitamin B-12 100 MCG tablet Commonly known as:  CYANOCOBALAMIN Take 1 tablet (100 mcg total) by mouth daily. What changed:  medication strength  how much to take      Follow-up Information    Erick Colace, MD Follow up.   Specialty:  Physical Medicine and Rehabilitation Why:  office will call you with follow up appointment Contact information: [address] [place] [address] [phone]  SETHI,PRAMOD, MD. Call in 1 day(s).   Specialties:  Neurology, Radiology Why:  for follow up appointment Contact information: [address] [place] [address] [phone]        Antony Contras, MD. Call in 2 day(s).   Specialty:  Ophthalmology Why:  for follow up appointment Contact information: [address] [place] [address] [phone]        Joaquin Courts, FNP Follow up on 03/17/2017.   Specialty:  Family Medicine Why:  Post hospital appointment @ 2:00 pm. Will need follow up on left thyroid nodule.  Contact information: [address] [place] [address] [phone]        Pollyann Savoy, MD Follow up.   Specialty:  Rheumatology Why:  Office to call you with follow up appointment for work up.  Contact information: [address] [place] [address] [phone]           Signed: Jacquelynn Cree 03/13/2017, 8:39 AM

## 2017-03-10 NOTE — Progress Notes (Signed)
Subjective/Complaints: Pt smiling , modI ADL with OT, still has HA but is smiling  ROS-  Denies CP,SOB, -N/V/D, no pain with urination Objective: Vital Signs: Blood pressure 130/80, pulse 74, temperature 98.1 F (36.7 C), temperature source Oral, resp. rate 18, height _0  (1.6 m), weight 110.1 kg (242 lb 11.6 oz), SpO2 97 %. No results found. Results for orders placed or performed during the hospital encounter of 03/02/17 (from the past 72 hour(s))  Basic metabolic panel     Status: Abnormal   Collection Time: 03/08/17  7:41 AM  Result Value Ref Range   Sodium 138 135 - 145 mmol/L   Potassium 3.5 3.5 - 5.1 mmol/L   Chloride 106 101 - 111 mmol/L   CO2 24 22 - 32 mmol/L   Glucose, Bld 103 (H) 65 - 99 mg/dL   BUN 13 6 - 20 mg/dL   Creatinine, Ser 0.88 0.44 - 1.00 mg/dL   Calcium 9.2 8.9 - 10.3 mg/dL   GFR calc non Af Amer >60 >60 mL/min   GFR calc Af Amer >60 >60 mL/min    Comment: (NOTE) The eGFR has been calculated using the CKD EPI equation. This calculation has not been validated in all clinical situations. eGFR's persistently <60 mL/min signify possible Chronic Kidney Disease.    Anion gap 8 5 - 15  CBC     Status: Abnormal   Collection Time: 03/08/17  7:41 AM  Result Value Ref Range   WBC 6.1 4.0 - 10.5 K/uL   RBC 5.21 (H) 3.87 - 5.11 MIL/uL   Hemoglobin 13.0 12.0 - 15.0 g/dL   HCT 39.4 36.0 - 46.0 %   MCV 75.6 (L) 78.0 - 100.0 fL   MCH 25.0 (L) 26.0 - 34.0 pg   MCHC 33.0 30.0 - 36.0 g/dL   RDW 15.6 (H) 11.5 - 15.5 %   Platelets 321 150 - 400 K/uL  Occult blood card to lab, stool     Status: None   Collection Time: 03/08/17  7:16 PM  Result Value Ref Range   Fecal Occult Bld NEGATIVE NEGATIVE     HEENT: no facial assymetry, eyes non injected, no ptosis Cardio: RRR and no murmur or ES Resp: CTA B/L and unlabored GI: BS positive and NT, ND Extremity:  BS positive and NT, ND Skin:   Intact Neuro: Alert/Oriented, Cranial Nerve II-XII normal, Abnormal Sensory  reduced LT sensation on right side face V1,2,3, reduced RUE and RLE, Abnormal Motor 4/5 in RUE and RLE, 5/5 on left and Abnormal FMC Ataxic/ dec FMC, difficulty touching thumb to 4th and 5th digit Right neglect on visual confrontation C/o "double vision"  With Left lateral gaze even when closing one eye Musc/Skel:  Other no pain with LUE or LLE ROM Gen NAD   Assessment/Plan: 1. Functional deficits secondary to Left thalamic infarct causing right hemiparesis and sensory deficits which require 3+ hours per day of interdisciplinary therapy in a comprehensive inpatient rehab setting. Physiatrist is providing close team supervision and 24 hour management of active medical problems listed below. Physiatrist and rehab team continue to assess barriers to discharge/monitor patient progress toward functional and medical goals. FIM: Function - Bathing Position: Shower Body parts bathed by patient: Right arm, Left arm, Chest, Abdomen, Front perineal area, Right upper leg, Left upper leg, Right lower leg, Left lower leg, Buttocks, Back Body parts bathed by helper: Buttocks, Back Assist Level: More than reasonable time  Function- Upper Body Dressing/Undressing What is the patient wearing?:  Pull over shirt/dress, Bra Bra - Perfomed by patient: Thread/unthread right bra strap, Thread/unthread left bra strap, Hook/unhook bra (pull down sports bra) Bra - Perfomed by helper: Hook/unhook bra (pull down sports bra) Pull over shirt/dress - Perfomed by patient: Thread/unthread left sleeve, Put head through opening, Thread/unthread right sleeve, Pull shirt over trunk Pull over shirt/dress - Perfomed by helper: Thread/unthread right sleeve, Pull shirt over trunk Assist Level: More than reasonable time Function - Lower Body Dressing/Undressing What is the patient wearing?: Pants, Socks, Shoes, Underwear Position: Wheelchair/chair at [employer] - Performed by patient: Thread/unthread right underwear leg,  Thread/unthread left underwear leg, Pull underwear up/down Pants- Performed by patient: Thread/unthread left pants leg, Thread/unthread right pants leg, Pull pants up/down Pants- Performed by helper: Thread/unthread right pants leg, Pull pants up/down Non-skid slipper socks- Performed by patient: Don/doff right sock, Don/doff left sock Socks - Performed by patient: Don/doff right sock, Don/doff left sock Shoes - Performed by patient: Don/doff right shoe, Don/doff left shoe, Fasten right, Fasten left Assist for footwear: Independent Assist for lower body dressing: More than reasonable time  Function - Toileting Toileting activity did not occur: No continent bowel/bladder event Toileting steps completed by patient: Adjust clothing prior to toileting, Performs perineal hygiene, Adjust clothing after toileting Toileting steps completed by helper: Adjust clothing after toileting Toileting Assistive Devices: Grab bar or rail Assist level: Supervision or verbal cues  Function Midwife transfer assistive device: Grab bar Assist level to toilet: Supervision or verbal cues Assist level from toilet: Supervision or verbal cues  Function - Chair/bed transfer Chair/bed transfer method: Ambulatory Chair/bed transfer assist level: Supervision or verbal cues Chair/bed transfer assistive device: Cane, Armrests Chair/bed transfer details: Manual facilitation for weight shifting, Tactile cues for initiation, Tactile cues for weight shifting, Verbal cues for sequencing, Verbal cues for technique  Function - Locomotion: Wheelchair Will patient use wheelchair at discharge?: No Type: Manual Max wheelchair distance: 367f  Assist Level: Supervision or verbal cues Assist Level: Supervision or verbal cues Wheel 150 feet activity did not occur: Safety/medical concerns Assist Level: Supervision or verbal cues Turns around,maneuvers to table,bed, and toilet,negotiates 3% grade,maneuvers on rugs  and over doorsills: Yes Function - Locomotion: Ambulation Assistive device: Cane-straight Max distance: 2066f Assist level: Supervision or verbal cues Assist level: Supervision or verbal cues Walk 50 feet with 2 turns activity did not occur: Safety/medical concerns Assist level: Supervision or verbal cues Walk 150 feet activity did not occur: Safety/medical concerns Assist level: Supervision or verbal cues Walk 10 feet on uneven surfaces activity did not occur: Safety/medical concerns  Function - Comprehension Comprehension: Auditory Comprehension assist level: Follows complex conversation/direction with extra time/assistive device  Function - Expression Expression: Verbal Expression assist level: Expresses complex ideas: With extra time/assistive device  Function - Social Interaction Social Interaction assist level: Interacts appropriately with others with medication or extra time (anti-anxiety, antidepressant).  Function - Problem Solving Problem solving assist level: Solves complex problems: With extra time  Function - Memory Memory assist level: More than reasonable amount of time Patient normally able to recall (first 3 days only): Location of own room, Current season, Staff names and faces, That he or she is in a hospital  Medical Problem List and Plan: 1.  Gait abnormality, limitations with self-care secondary to left thalamus infarct.  Cont CIR PT, OT, plan d/c in am 2.  DVT Prophylaxis/Anticoagulation: Pharmaceutical: Lovenox 3. Pain Management: Will change oxycodone to ultram prn for joint pain/aches.  increase topamax for HA,  182m BID, decrease tramadol 4. Mood: team to provide ego support to help with adjustment reaction. LCSW to follow for evaluation and support.  5. Neuropsych: This patient is capable of making decisions on her own behalf. 6. Skin/Wound Care: routine pressure relief measures. 7. Fluids/Electrolytes/Nutrition: Monitor I/O. Check lytes in am.  Offer supplements between meals as reports poor intake.  8. HTN: Permissive HTN  Will add medication as indicated. Cont to monitor for now Vitals:   03/09/17 1536 03/10/17 0533  BP: (!) 148/90 130/80  Pulse: 81 74  Resp: 18 18  Temp: 98.6 F (37 C) 98.1 F (36.7 C)   9. Dyslipidemia: On Lipitor.  10.Sickle Cell trait: Iron studies WNL. She does not have primary MD--will need PMD at discharge per Dr. MZigmund Daniel  Hematology consult appreciated,per Heme CVA etiology likely not sickle cell trait related Rheum f/u as OP  11. Thyroid nodule: Follow up with primary MD for workup. TSH WNL 12.  Visual affects of CVA not a convergence issue, no field cut but has some Right hemispatial neglect, patching will not help  13.  Diffuse intracranial atherosclerosis- cont ASA 3248m Moderate or severe stenoses of the: - Left PCA P1 and P2, - Right MCA origin and M1, - Left ACA A2 and bilateral distal pericallosal arteries, - Left MCA M2 and M3. 14.  Poor appetite, may be mood disorder related to CVA, vs Side effect of topamax, supplement with boost, given obesity long range goal is weight loss but does need adequate nutrition for acute recovery- intake 10-75% of meals LOS (Days) 8 A FACE TO FACE EVALUATION WAS PERFORMED  Sharon Chan 03/10/2017, 7:33 AM

## 2017-03-11 NOTE — Progress Notes (Signed)
Subjective/Complaints: No issues overnite  ROS-  Denies CP,SOB, -N/V/D, no pain with urination Objective: Vital Signs: Blood pressure 125/79, pulse 80, temperature 97.8 F (36.6 C), temperature source Oral, resp. rate 18, height  (1.6 m), weight 110.1 kg (242 lb 11.6 oz), SpO2 98 %. No results found. Results for orders placed or performed during the hospital encounter of 03/02/17 (from the past 72 hour(s))  Occult blood card to lab, stool     Status: None   Collection Time: 03/08/17  7:16 PM  Result Value Ref Range   Fecal Occult Bld NEGATIVE NEGATIVE     HEENT: no facial assymetry, eyes non injected, no ptosis Cardio: RRR and no murmur or ES Resp: CTA B/L and unlabored GI: BS positive and NT, ND Extremity:  BS positive and NT, ND Skin:   Intact Neuro: Alert/Oriented, Cranial Nerve II-XII normal, Abnormal Motor 4/5 in RUE and RLE, 5/5 on left and Abnormal FMC Ataxic/ dec FMC, difficulty touching thumb to 4th and 5th digit Right neglect on visual confrontation C/o "double vision"  With Left lateral gaze even when closing one eye Musc/Skel:  Other no pain with LUE or LLE ROM Gen NAD   Assessment/Plan: 1. Functional deficits secondary to Left thalamic infarct  Stable for D/C today F/u PCP in 3-4 weeks F/u PM&R 2 weeks See D/C summary See D/C instructions FIM: Function - Bathing Position: Shower Body parts bathed by patient: Right arm, Left arm, Chest, Abdomen, Front perineal area, Right upper leg, Left upper leg, Right lower leg, Left lower leg, Buttocks, Back Body parts bathed by helper: Buttocks, Back Assist Level: More than reasonable time  Function- Upper Body Dressing/Undressing What is the patient wearing?: Pull over shirt/dress, Bra Bra - Perfomed by patient: Thread/unthread right bra strap, Thread/unthread left bra strap, Hook/unhook bra (pull down sports bra) Bra - Perfomed by helper: Hook/unhook bra (pull down sports bra) Pull over shirt/dress - Perfomed  by patient: Thread/unthread left sleeve, Put head through opening, Thread/unthread right sleeve, Pull shirt over trunk Pull over shirt/dress - Perfomed by helper: Thread/unthread right sleeve, Pull shirt over trunk Assist Level: More than reasonable time Function - Lower Body Dressing/Undressing What is the patient wearing?: Pants, Socks, Shoes, Underwear Position: Wheelchair/chair at [employer] - Performed by patient: Thread/unthread right underwear leg, Thread/unthread left underwear leg, Pull underwear up/down Pants- Performed by patient: Thread/unthread left pants leg, Thread/unthread right pants leg, Pull pants up/down Pants- Performed by helper: Thread/unthread right pants leg, Pull pants up/down Non-skid slipper socks- Performed by patient: Don/doff right sock, Don/doff left sock Socks - Performed by patient: Don/doff right sock, Don/doff left sock Shoes - Performed by patient: Don/doff right shoe, Don/doff left shoe, Fasten right, Fasten left Assist for footwear: Independent Assist for lower body dressing: More than reasonable time  Function - Toileting Toileting activity did not occur: No continent bowel/bladder event Toileting steps completed by patient: Adjust clothing prior to toileting, Performs perineal hygiene, Adjust clothing after toileting Toileting steps completed by helper: Adjust clothing after toileting Toileting Assistive Devices: Grab bar or rail Assist level: Supervision or verbal cues  Function - Archivist transfer assistive device: Grab bar Assist level to toilet: No Help, no cues, assistive device, takes more than a reasonable amount of time Assist level from toilet: No Help, no cues, assistive device, takes more than a reasonable amount of time  Function - Chair/bed transfer Chair/bed transfer method: Ambulatory Chair/bed transfer assist level: No Help, no cues, assistive device, takes more than  a reasonable amount of time Chair/bed  transfer assistive device: Cane Chair/bed transfer details: Manual facilitation for weight shifting, Tactile cues for initiation, Tactile cues for weight shifting, Verbal cues for sequencing, Verbal cues for technique  Function - Locomotion: Wheelchair Will patient use wheelchair at discharge?: No Type: Manual Wheelchair activity did not occur: N/A Max wheelchair distance: 323ft  Assist Level: Supervision or verbal cues Wheel 50 feet with 2 turns activity did not occur: N/A Assist Level: Supervision or verbal cues Wheel 150 feet activity did not occur: N/A Assist Level: Supervision or verbal cues Turns around,maneuvers to table,bed, and toilet,negotiates 3% grade,maneuvers on rugs and over doorsills: Yes Function - Locomotion: Ambulation Assistive device: Cane-straight Max distance: 272ft  Assist level: No help, No cues, assistive device, takes more than a reasonable amount of time Assist level: No help, No cues, assistive device, takes more than a reasonable amount of time Walk 50 feet with 2 turns activity did not occur: Safety/medical concerns Assist level: No help, No cues, assistive device, takes more than a reasonable amount of time Walk 150 feet activity did not occur: Safety/medical concerns Assist level: No help, No cues, assistive device, takes more than a reasonable amount of time Walk 10 feet on uneven surfaces activity did not occur: Safety/medical concerns  Function - Comprehension Comprehension: Auditory Comprehension assist level: Follows complex conversation/direction with extra time/assistive device  Function - Expression Expression: Verbal Expression assist level: Expresses complex ideas: With extra time/assistive device  Function - Social Interaction Social Interaction assist level: Interacts appropriately with others with medication or extra time (anti-anxiety, antidepressant).  Function - Problem Solving Problem solving assist level: Solves complex problems:  With extra time  Function - Memory Memory assist level: More than reasonable amount of time Patient normally able to recall (first 3 days only): Location of own room, Current season, Staff names and faces, That he or she is in a hospital  Medical Problem List and Plan: 1.  Gait abnormality, limitations with self-care secondary to left thalamus infarct.   plan d/c today 2.  DVT Prophylaxis/Anticoagulation: Pharmaceutical: Lovenox 3. Pain Management: Will change oxycodone to ultram prn for joint pain/aches.  increase topamax for HA,  BID, decrease tramadol 4. Mood: team to provide ego support to help with adjustment reaction. LCSW to follow for evaluation and support.  5. Neuropsych: This patient is capable of making decisions on her own behalf. 6. Skin/Wound Care: routine pressure relief measures. 7. Fluids/Electrolytes/Nutrition: Monitor I/O. Check lytes in am. Offer supplements between meals as reports poor intake.  8. HTN: Permissive HTN  Will add medication as indicated. Cont to monitor for now Vitals:   03/10/17 1240 03/11/17 0500  BP: 131/85 125/79  Pulse: 88 80  Resp: 18 18  Temp: 98.5 F (36.9 C) 97.8 F (36.6 C)   9. Dyslipidemia: On Lipitor.  10.Sickle Cell trait: Iron studies WNL. She does not have primary MD--will need PMD at discharge per Dr. Ashley Royalty.  Hematology consult appreciated,per Heme CVA etiology likely not sickle cell trait related Rheum f/u as OP  11. Thyroid nodule: Follow up with primary MD for workup. TSH WNL 12.  Visual affects of CVA not a convergence issue, no field cut but has some Right hemispatial neglect, patching will not help  13.  Diffuse intracranial atherosclerosis- cont ASA   Moderate or severe stenoses of the: - Left PCA P1 and P2, - Right MCA origin and M1, - Left ACA A2 and bilateral distal pericallosal arteries, - Left MCA M2 and  M3. 14.  Poor appetite, may be mood disorder related to CVA, vs Side effect of topamax,  supplement with boost, given obesity long range goal is weight loss but does need adequate nutrition for acute recovery- intake 10-75% of meals LOS (Days) 9 A FACE TO FACE EVALUATION WAS PERFORMED  Tyrian Peart E 03/11/2017, 8:11 AM

## 2017-03-11 NOTE — Progress Notes (Signed)
Patient discharged this morning. Escorted to lobby with belongings by her mother and nurse tech.

## 2017-03-13 ENCOUNTER — Telehealth: Payer: Self-pay | Admitting: Nurse Practitioner

## 2017-03-13 ENCOUNTER — Telehealth: Payer: Self-pay

## 2017-03-13 NOTE — Telephone Encounter (Signed)
Hospital f/u has been scheduled for the pt to see Misty Stanley on 5/14 at 215pm per Dr. Truett Perna. Pt agreed to the appt date and time.

## 2017-03-13 NOTE — Telephone Encounter (Signed)
Transitional Care call  Patient name: (Sharon Chan) DOB: (1976-01-03) 1. Are you/is patient experiencing any problems since coming home? (NO) a. Are there any questions regarding any aspect of care? (NO) 2. Are there any questions regarding medications administration/dosing? (NO) a. Are meds being taken as prescribed? (YES) b. "Patient should review meds with caller to confirm"  3. Have there been any falls? (YES) 4. Has Home Health been to the house and/or have they contacted you? (NO) a. If not, have you tried to contact them? (NO) b. Can we help you contact them? (YES) 5. Are bowels and bladder emptying properly? (YES) a. Are there any unexpected incontinence issues? (NO) b. If applicable, is patient following bowel/bladder programs? (NA) 6. Any fevers, problems with breathing, unexpected pain? (NO) 7. Are there any skin problems or new areas of breakdown? (NO) 8. Has the patient/family member arranged specialty MD follow up (ie cardiology/neurology/renal/surgical/etc.)?  (YES) a. Can we help arrange? (NO) 9. Does the patient need any other services or support that we can help arrange? (NO) 10. Are caregivers following through as expected in assisting the patient? (NO) 11. Has the patient quit smoking, drinking alcohol, or using drugs as recommended? (NA)  Appointment date/time (03-24-2017 / 1030AM), arrive time (1000AM) and who it is with here(DR. Wynn Banker) [address] [phone]

## 2017-03-15 LAB — MISC LABCORP TEST (SEND OUT): Labcorp test code: 511172

## 2017-03-16 ENCOUNTER — Telehealth: Payer: Self-pay | Admitting: Occupational Therapy

## 2017-03-16 ENCOUNTER — Ambulatory Visit: Payer: Medicare Other | Attending: Physical Medicine & Rehabilitation | Admitting: Occupational Therapy

## 2017-03-16 ENCOUNTER — Ambulatory Visit: Payer: Medicare Other | Admitting: Rehabilitative and Restorative Service Providers"

## 2017-03-16 VITALS — BP 137/100 | HR 73

## 2017-03-16 DIAGNOSIS — R278 Other lack of coordination: Secondary | ICD-10-CM | POA: Insufficient documentation

## 2017-03-16 DIAGNOSIS — R2681 Unsteadiness on feet: Secondary | ICD-10-CM

## 2017-03-16 DIAGNOSIS — R41842 Visuospatial deficit: Secondary | ICD-10-CM | POA: Diagnosis not present

## 2017-03-16 DIAGNOSIS — R2689 Other abnormalities of gait and mobility: Secondary | ICD-10-CM

## 2017-03-16 DIAGNOSIS — I69353 Hemiplegia and hemiparesis following cerebral infarction affecting right non-dominant side: Secondary | ICD-10-CM | POA: Diagnosis not present

## 2017-03-16 DIAGNOSIS — I69319 Unspecified symptoms and signs involving cognitive functions following cerebral infarction: Secondary | ICD-10-CM | POA: Insufficient documentation

## 2017-03-16 DIAGNOSIS — R41840 Attention and concentration deficit: Secondary | ICD-10-CM | POA: Diagnosis not present

## 2017-03-16 DIAGNOSIS — R208 Other disturbances of skin sensation: Secondary | ICD-10-CM | POA: Diagnosis not present

## 2017-03-16 DIAGNOSIS — M6281 Muscle weakness (generalized): Secondary | ICD-10-CM | POA: Diagnosis not present

## 2017-03-16 DIAGNOSIS — R4701 Aphasia: Secondary | ICD-10-CM | POA: Diagnosis not present

## 2017-03-16 NOTE — Therapy (Signed)
[facility] [address] [place], [place], [address] Phone: [phone]   Fax:  [phone]  Occupational Therapy Evaluation  Patient Details  Name: Sharon Chan MRN: [id number] Date of Birth: 03/08/1976 Referring Provider: Dr. Claudette Laws  Encounter Date: 03/16/2017      OT End of Session - 03/16/17 1719    Visit Number 1   Number of Visits 21   Date for OT Re-Evaluation 05/15/17   Authorization Type Medicare/Medicaid; g-code needed   Authorization - Visit Number 1   Authorization - Number of Visits 10   OT Start Time 0845   OT Stop Time 0935   OT Time Calculation (min) 50 min   Activity Tolerance Patient tolerated treatment well   Behavior During Therapy [facility] for tasks assessed/performed      Past Medical History:  Diagnosis Date  . Polycystic disease, ovaries   . Sickle cell trait [facility])     Past Surgical History:  Procedure Laterality Date  . LAPAROSCOPIC CHOLECYSTECTOMY      There were no vitals filed for this visit.      Subjective Assessment - 03/16/17 0852    Subjective  Pt reports difficulty with attention now   Pertinent History L CVA 02/25/17; HTN, polycystic kidney disease, hyperlipidemia, sickle cell trait, scans showed 2 old CVAs, sickle cell trait, prediabetes    Limitations no driving, fall risk, visual deficits   Currently in Pain? Yes   Pain Score 10-Worst pain ever   Pain Location Head   Pain Descriptors / Indicators Headache   Pain Type Acute pain   Pain Onset 1 to 4 weeks ago   Pain Frequency Constant   Aggravating Factors  light   Pain Relieving Factors unknown           [facility] OT Assessment - 03/16/17 0001      Assessment   Diagnosis L CVA, sickle cell   Referring Provider Dr. Claudette Laws   Onset Date 02/25/17   Assessment 02/25/17-03/11/17 hospitalization   Prior Therapy inpatient rehab     Precautions   Precautions Fall     Balance Screen   Has  the patient fallen in the past 6 months Yes   How many times? 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     ADL   Eating/Feeding Modified independent  min A occasionally for cutting   Grooming Modified independent  need A to pull hair back   Upper Body Bathing Modified independent   Lower Body Bathing Modified independent   Upper Body Dressing Increased time  mod I    Lower Body Dressing Supervision/safety;Needs assist for fasteners  1 fall when pulling up pants   Toilet Tranfer Modified independent  BSC   Toileting - Clothing Manipulation Modified independent   Toileting -  Hygiene Modified Independent   Tub/Shower Transfer Supervision/safety  but 1 fall in shower   Tub/Shower Transfer Equipment --  shower/bath combo; tub bench   Transfers/Ambulation Related to ADL's Mod I per pt, but hx of fall  IADL   Prior Level of Function Shopping independent   Shopping Needs to be accompanied on any shopping trip  motorized scooter in [place]   Prior Level of Function Light Housekeeping independent   Light Housekeeping --  did laundry, vacuumed 1 room   Prior Level of Function Meal Prep independent   Meal Prep --  juiced, microwave, coffee   Prior Level of Function International aid/development worker Relies on family or friends for transportation   Medication Management Has difficulty remembering to take medication  mom assisting   Prior Level of Function Financial Management independent     Mobility   Mobility Status History of falls  supervision-mod I with single point cane     Written  Expression   Dominant Hand Left     Vision - History   Baseline Vision No visual deficits     Vision Assessment   Vision Assessment Vision impaired  _ to be further tested in functional context   Ocular Range of Motion --  Grossly WFL   Tracking/Visual Pursuits --  Grossly WFL   Visual Fields --  ? decr on R side, but pt reports blurriness   Diplopia Assessment --  denies   Patient has diffculty with activities due to visual impairment --  reading   Comment pt reports blurriness with R eye "kaleidoscope" vision per pt and sometimes "darkness with L eye"     Cognition   Overall Cognitive Status Impaired/Different from baseline  difficulty w/faces, word finding, directions   Area of Impairment Attention;Memory;Awareness   Current Attention Level Selective  pt reports difficulty focusing   Memory Decreased short-term memory   Memory Comments difficulty remembering medications   Awareness Emergent   Behaviors --  reports feeling "foggy", difficulty reading     Sensation   Light Touch Impaired Detail   Hot/Cold Impaired by gross assessment  per pt    Proprioception Impaired by gross assessment   Additional Comments RUE, foot, and face numb     Coordination   9 Hole Peg Test Right;Left   Right 9 Hole Peg Test 57.19  decr in-hand manipulation, uses shoulder compensation   Left 9 Hole Peg Test 19.63   Box and Blocks R-28blocks, L-52blocks     ROM / Strength   AROM / PROM / Strength AROM;Strength     AROM   Overall AROM  Within functional limits for tasks performed   Overall AROM Comments decr ability to oppose to 5th digit with R hand     Strength   Overall Strength Deficits   Overall Strength Comments RUE proximal strength grossly 3 to 3+ shoulder strength, 4-/5 biceps/triceps (but noted inconsistency, ?due to decr sensation and or R inattention).  decr core stability also noted     Hand Function   Right Hand Grip (lbs) 18   Left Hand Grip (lbs) 60                          OT Education - 03/16/17 1207    Education provided Yes   Education Details OT eval results, POC, recommendation for speech therapy--pt agrees; recommendation not to sweep as recommend using 1UE support for dynamic balance tasks due to falls   Person(s) Educated Patient   Methods Explanation   Comprehension Verbalized understanding          OT Short Term Goals - 03/16/17 1728      OT SHORT TERM GOAL #  1   Title Pt will be independent with initial HEP.--check STGs 04/15/17   Time 4   Period Weeks   Status New     OT SHORT TERM GOAL #2   Title Pt will improve R hand coordination/functional reaching as shown by improving score on box and blocks test by at least 10.   Baseline R-28blocks, L-52blocks   Time 4   Period Weeks   Status New     OT SHORT TERM GOAL #3   Title Pt will improve R hand coordination for ADLs as shown by improving time on 9-hole peg test by at least 10sec.   Baseline R-57.19sec   Time 4   Period Weeks   Status New     OT SHORT TERM GOAL #4   Title Pt will perform tabletop visual scanning with at least 95% accuracy with incr time.   Time 4   Period Weeks   Status New     OT SHORT TERM GOAL #5   Title Pt will verbalize understanding of memory/visual compensation strategies.   Time 4   Period Weeks   Status New     Additional Short Term Goals   Additional Short Term Goals Yes     OT SHORT TERM GOAL #6   Title Pt will improve R grip strength by at least 8lbs to assist with ADLs.   Baseline R-18lbs   Time 4   Period Weeks   Status New     OT SHORT TERM GOAL #7   Title Pt will perform dressing/bathing without falls x 2 weeks utilizing safety strategies (supervision).   Time 4   Period Weeks   Status New           OT Long Term Goals - 03/16/17 1738      OT LONG TERM GOAL #1   Title Pt will be independent with updated HEP.--check LTGs 05/15/17   Time 8   Period Weeks   Status New     OT LONG TERM  GOAL #2   Title Pt will perform simple cooking/home maintenance tasks mod I.   Time 8   Period Weeks   Status New     OT LONG TERM GOAL #3   Title Pt will improve coordination for ADLs as shown by improving time on 9-hole peg test by at least 20sec with RUE.   Baseline R-57.19sec   Time 8   Period Weeks   Status New     OT LONG TERM GOAL #4   Title Pt will perform environmental scanning with at least 90% accuracy for incr safety in the community.   Time 8   Period Weeks   Status New     OT LONG TERM GOAL #5   Title Pt will improve R grip strength by at least 15lbs to assist with ADLs.   Baseline R-18lbs   Time 8   Period Weeks   Status New     Long Term Additional Goals   Additional Long Term Goals Yes     OT LONG TERM GOAL #6   Title Pt will be able to retrieve 3lb object from overhead shelf x3 safely with RUE.   Time 8   Period Weeks   Status New               Plan - 03/16/17 1720    Clinical Impression Statement Pt is a 41 y.o. female s/p L CVA 02/25/17.  Pt with PMH that includes:  HTN,  polycystic kidney disease, hyperlipidemia, sickle cell trait, scans showed 2 old CVAs, sickle cell trait, prediabetes.  Pt lived alone, was independent with ADLs/IADLs, and did volunteer work prior to CVA.  Pt was on disability due to Sickle Cell.  Pt presents today with R hemiparesis with decr sensation, decr coordination, decr strength, decr balance/functional mobility for ADLs (with 4 falls since hospital d/c), cognitive deficits, and visual deficits.  Pt would benefit from occupational therapy to address these deficits in order to improve RUE functional use and improve ADL/IADL safety/independence,.   Rehab Potential Good   OT Frequency 3x / week  eval + 3x/wk for 4 weeks followed by 2x week for 4 weeks   OT Duration 8 weeks   OT Treatment/Interventions Self-care/ADL training;Moist Heat;Fluidtherapy;DME and/or AE instruction;Splinting;Patient/family education;Balance  training;Therapeutic exercises;Contrast Bath;Ultrasound;Therapeutic exercise;Therapeutic activities;Cognitive remediation/compensation;Passive range of motion;Functional Mobility Training;Neuromuscular education;Cryotherapy;Electrical Stimulation;Parrafin;Energy conservation;Manual Therapy;Visual/perceptual remediation/compensation   Plan dressing strategies for improved safety/decr falls, coordination HEP, tabletop visual scanning   Recommended Other Services PT current; Recommend Speech Therapy Referral (via telephone encounter 04-13-17)   Consulted and Agree with Plan of Care Patient      Patient will benefit from skilled therapeutic intervention in order to improve the following deficits and impairments:  Decreased coordination, Impaired sensation, Decreased range of motion, Decreased safety awareness, Decreased activity tolerance, Decreased balance, Decreased knowledge of use of DME, Impaired UE functional use, Impaired vision/preception, Impaired perceived functional ability, Decreased strength, Decreased mobility, Decreased cognition  Visit Diagnosis: Hemiplegia and hemiparesis following cerebral infarction affecting right non-dominant side (HCC)  Other disturbances of skin sensation  Other lack of coordination  Muscle weakness (generalized)  Unspecified symptoms and signs involving cognitive functions following cerebral infarction  Attention and concentration deficit  Unsteadiness on feet  Other abnormalities of gait and mobility  Visuospatial deficit      G-Codes - 04/13/2017 1729    Functional Assessment Tool Used (Outpatient only) 9-hole peg test R-57.19sec.  Box and blocks test:  R-28.  R grip strength 18lbs   Functional Limitation Carrying, moving and handling objects   Carrying, Moving and Handling Objects Current Status [phone]) At least 60 percent but less than 80 percent impaired, limited or restricted   Carrying, Moving and Handling Objects Goal Status ([id number]) At least  20 percent but less than 40 percent impaired, limited or restricted      Problem List Patient Active Problem List   Diagnosis Date Noted  . Visual disturbance as complication of stroke 03/07/2017  . Adjustment disorder with anxious mood   . 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 Sickle cell trait (HCC)   . Hb-SS disease with crisis (HCC)   . Hyperlipidemia   . Thyroid nodule   . Changes in vision   . Acute ischemic stroke (HCC)   . Dysphagia, post-stroke   . Benign essential HTN   . Polycystic kidney disease   . Prediabetes   . Acute intractable headache   . Leukocytosis   . Stroke (cerebrum) [facility]) 02/25/2017    [facility] Apr 13, 2017, 5:44 PM  [facility] [address] [place], [place], [address] Phone: [phone]   Fax:  [phone]  Name: Sharon Chan MRN: [id number] Date of Birth: August 31, 1976   Willa Frater, OTR/L [facility] [address] [place], [place]  [address] [phone] phone [phone] 2017-04-13 5:44 PM

## 2017-03-16 NOTE — Telephone Encounter (Addendum)
Dr. Wynn BankerKirsteins,  Sharon Chan was seen for OT/PT evaluations today.  She reports/demonstrates continued difficulty with word finding, memory, and attention, decr awareness, and reports swallowing concerns now that she is in home environment.  She may benefit from referral to outpatient speech therapy to further evaluate/address this.  If you agree, please send Speech therapy referral via Epic.  Thank you,  Sharon Chan, OTR/L [facility] [address] [place], [place]  [id number] [phone] phone [phone] 03/16/17 1:06 PM

## 2017-03-17 ENCOUNTER — Encounter: Payer: Self-pay | Admitting: Family Medicine

## 2017-03-17 ENCOUNTER — Ambulatory Visit (INDEPENDENT_AMBULATORY_CARE_PROVIDER_SITE_OTHER): Payer: Medicare Other | Admitting: Family Medicine

## 2017-03-17 VITALS — BP 140/92 | HR 95 | Temp 99.1°F | Resp 16 | Ht 63.0 in | Wt 219.0 lb

## 2017-03-17 DIAGNOSIS — R51 Headache: Secondary | ICD-10-CM

## 2017-03-17 DIAGNOSIS — E041 Nontoxic single thyroid nodule: Secondary | ICD-10-CM

## 2017-03-17 DIAGNOSIS — I63411 Cerebral infarction due to embolism of right middle cerebral artery: Secondary | ICD-10-CM

## 2017-03-17 DIAGNOSIS — I639 Cerebral infarction, unspecified: Secondary | ICD-10-CM | POA: Diagnosis not present

## 2017-03-17 DIAGNOSIS — R519 Headache, unspecified: Secondary | ICD-10-CM

## 2017-03-17 DIAGNOSIS — G8929 Other chronic pain: Secondary | ICD-10-CM

## 2017-03-17 DIAGNOSIS — R7303 Prediabetes: Secondary | ICD-10-CM | POA: Diagnosis not present

## 2017-03-17 DIAGNOSIS — I6381 Other cerebral infarction due to occlusion or stenosis of small artery: Secondary | ICD-10-CM

## 2017-03-17 DIAGNOSIS — I10 Essential (primary) hypertension: Secondary | ICD-10-CM | POA: Diagnosis not present

## 2017-03-17 DIAGNOSIS — I6930 Unspecified sequelae of cerebral infarction: Secondary | ICD-10-CM

## 2017-03-17 LAB — CBC WITH DIFFERENTIAL/PLATELET
Basophils Absolute: 66 cells/uL (ref 0–200)
Basophils Relative: 1 %
EOS ABS: 264 {cells}/uL (ref 15–500)
Eosinophils Relative: 4 %
HEMATOCRIT: 38.7 % (ref 35.0–45.0)
HEMOGLOBIN: 13 g/dL (ref 11.7–15.5)
LYMPHS PCT: 47 %
Lymphs Abs: 3102 cells/uL (ref 850–3900)
MCH: 25.4 pg — ABNORMAL LOW (ref 27.0–33.0)
MCHC: 33.6 g/dL (ref 32.0–36.0)
MCV: 75.6 fL — ABNORMAL LOW (ref 80.0–100.0)
MONO ABS: 396 {cells}/uL (ref 200–950)
MPV: 8.6 fL (ref 7.5–12.5)
Monocytes Relative: 6 %
NEUTROS PCT: 42 %
Neutro Abs: 2772 cells/uL (ref 1500–7800)
Platelets: 312 10*3/uL (ref 140–400)
RBC: 5.12 MIL/uL — AB (ref 3.80–5.10)
RDW: 16.5 % — AB (ref 11.0–15.0)
WBC: 6.6 10*3/uL (ref 3.8–10.8)

## 2017-03-17 MED ORDER — LISINOPRIL 10 MG PO TABS: 10.0000 mg | ORAL_TABLET | Freq: Every day | ORAL | 3 refills | Status: DC

## 2017-03-17 MED ORDER — METFORMIN HCL 500 MG PO TABS
500.0000 mg | ORAL_TABLET | Freq: Two times a day (BID) | ORAL | 3 refills | Status: DC
Start: 2017-03-17 — End: 2018-03-07

## 2017-03-17 MED ORDER — GABAPENTIN 300 MG PO CAPS: ORAL_CAPSULE | ORAL | 3 refills | Status: DC

## 2017-03-17 MED ORDER — BUTALBITAL-APAP-CAFF-COD 50-325-40-30 MG PO CAPS: 1.0000 | ORAL_CAPSULE | ORAL | 0 refills | Status: DC | PRN

## 2017-03-17 NOTE — Therapy (Signed)
[facility] [address] [place], [place], [address] Phone: [phone]   Fax:  [phone]  Physical Therapy Evaluation  Patient Details  Name: Sharon Chan MRN: [id number] Date of Birth: 03/23/1976 Referring Provider: Claudette LawsAndrew Kirsteins, MD  Encounter Date: 03/16/2017      PT End of Session - 03/16/17 1020    Visit Number 1   Number of Visits 18   Date for PT Re-Evaluation 05/15/17   Authorization Type G code every 10th visit   PT Start Time 0935   PT Stop Time 1015   PT Time Calculation (min) 40 min   Equipment Utilized During Treatment Gait belt   Activity Tolerance Patient tolerated treatment well   Behavior [facility] Therapy [facility] for tasks assessed/performed      Past Medical History:  Diagnosis Date  . Polycystic disease, ovaries   . Sickle cell trait [facility](HCC)     Past Surgical History:  Procedure Laterality Date  . LAPAROSCOPIC CHOLECYSTECTOMY      Vitals:   03/16/17 0941  BP: (!) 137/100  Pulse: 73         Subjective Assessment - 03/16/17 0941    Subjective The patient is s/p CVA 02/25/17 c/o R knee buckling, headache, and speech difficulties.  She transferred to IP rehab on 03/02/2017 and then d/c on 03/11/2017.  She notes 4 falls since d/c home describing the R leg gives at times--she is able to get up on her own and notes no injury.  Her mother is providing supervision in the home.  The patient is using SPC in the home and community.    Pertinent History sickle cell, polycystic ovarian disease, obesity, thyroid nodule   Patient Stated Goals "To get back to a sense of normalcy", improve sensation in her right side and be independent.   Currently in Pain? Yes  occasional right arm pain, headaches   Pain Score 10-Worst pain ever   Pain Location Head   Pain Descriptors / Indicators Headache   Pain Type Acute pain   Pain Onset 1 to 4 weeks ago   Pain Frequency Constant   Aggravating Factors   patient has h/o migraines; light   Pain Relieving Factors unknown            [facility] PT Assessment - 03/16/17 0948      Assessment   Medical Diagnosis L CVA   Referring Provider Claudette LawsAndrew Kirsteins, MD   Onset Date/Surgical Date 02/25/17   Hand Dominance Left   Prior Therapy acute and IP rehab     Precautions   Precautions Fall   Precaution Comments 4 falls since d/c home     Restrictions   Weight Bearing Restrictions No     Balance Screen   Has the patient fallen in the past 6 months Yes   How many times?  4- over past 2 days   Has the patient had a decrease in activity level because of a fear of falling?  Yes   Is the patient reluctant to leave their home because of a fear of falling?  Yes     Home Environment   Living Environment Private residence   Living Arrangements Parent  mother helping for now   Type of Home Apartment   Home Access Level entry   Home Layout One level   Home Equipment [place] - single point     Prior Function   Level of Independence Independent   Vocation On disability  sickle cell  Leisure volunteers, involved with family, church     Observation/Other Assessments   Focus on Therapeutic Outcomes (FOTO)  56%   Other Surveys  Other Surveys   Neuro Quality of Life  44.4% Lower extremity     Sensation   Light Touch Impaired Detail   Light Touch Impaired Details Impaired RUE;Impaired RLE   Additional Comments Arm mostly numb and R foot.   Also notes diminished sensation R side of her face.     Coordination   Gross Motor Movements are Fluid and Coordinated --  slowed movements for pronation/supination     Posture/Postural Control   Posture/Postural Control Postural limitations   Postural Limitations Rounded Shoulders;Forward head     ROM / Strength   AROM / PROM / Strength AROM;Strength     AROM   Overall AROM  Within functional limits for tasks performed     Strength   Overall Strength Deficits   Overall Strength Comments R UE gross  assessment 3/5 for shoulder flexion/abduction, 3+/5 elbow flexion and extension.  5/5 L UE strength.    Strength Assessment Site Hip;Knee;Ankle   Right/Left Hip Right;Left   Right Hip Flexion 4/5   Left Hip Flexion 5/5   Right/Left Knee Right;Left   Right Knee Flexion 4/5   Right Knee Extension 4/5   Left Knee Flexion 5/5   Left Knee Extension 5/5   Right/Left Ankle Right;Left   Right Ankle Dorsiflexion 4/5   Left Ankle Dorsiflexion 5/5     Bed Mobility   Bed Mobility --  reports indep with all bed mobility     Transfers   Transfers Sit to Stand   Sit to Stand 7: Independent     Ambulation/Gait   Ambulation/Gait Yes   Ambulation/Gait Assistance 4: Min guard   Ambulation Distance (Feet) 300 Feet   Assistive device None   Gait Pattern Step-through pattern;Decreased stride length;Decreased stance time - right;Decreased step length - left;Decreased dorsiflexion - right   Ambulation Surface Level   Gait velocity 2 ft/sec   Stairs Yes   Stairs Assistance 4: Min guard   Stair Management Technique One rail Left;Step to pattern   Number of Stairs 4   Gait Comments dec'd R heel strike , dec'd timing     Standardized Balance Assessment   Standardized Balance Assessment Berg Balance Test     Berg Balance Test   Sit to Stand Able to stand without using hands and stabilize independently   Standing Unsupported Able to stand safely 2 minutes   Sitting with Back Unsupported but Feet Supported on Floor or Stool Able to sit safely and securely 2 minutes   Stand to Sit Sits safely with minimal use of hands   Transfers Able to transfer safely, minor use of hands   Standing Unsupported with Eyes Closed Able to stand 10 seconds safely   Standing Ubsupported with Feet Together Able to place feet together independently and stand 1 minute safely   From Standing, Reach Forward with Outstretched Arm Can reach forward >12 cm safely (5")   From Standing Position, Pick up Object from Floor Able to  pick up shoe safely and easily   From Standing Position, Turn to Look Behind Over each Shoulder Looks behind from both sides and weight shifts well   Turn 360 Degrees Able to turn 360 degrees safely in 4 seconds or less   Standing Unsupported, Alternately Place Feet on Step/Stool Able to stand independently and safely and complete 8 steps in 20 seconds  Standing Unsupported, One Foot in Front Able to plae foot ahead of the other independently and hold 30 seconds   Standing on One Leg Able to lift leg independently and hold > 10 seconds   Total Score 54   Berg comment: 54/56 not indicating high fall risk, although has had multiple falls.     Functional Gait  Assessment   Gait assessed  Yes   Gait Level Surface Walks 20 ft, slow speed, abnormal gait pattern, evidence for imbalance or deviates 10-15 in outside of the 12 in walkway width. Requires more than 7 sec to ambulate 20 ft.   Change in Gait Speed Makes only minor adjustments to walking speed, or accomplishes a change in speed with significant gait deviations, deviates 10-15 in outside the 12 in walkway width, or changes speed but loses balance but is able to recover and continue walking.   Gait with Horizontal Head Turns Performs head turns with moderate changes in gait velocity, slows down, deviates 10-15 in outside 12 in walkway width but recovers, can continue to walk.   Gait with Vertical Head Turns Performs task with moderate change in gait velocity, slows down, deviates 10-15 in outside 12 in walkway width but recovers, can continue to walk.   Gait and Pivot Turn Turns slowly, requires verbal cueing, or requires several small steps to catch balance following turn and stop   Step Over Obstacle Cannot perform without assistance.   Gait with Narrow Base of Support Ambulates less than 4 steps heel to toe or cannot perform without assistance.   Gait with Eyes Closed Walks 20 ft, slow speed, abnormal gait pattern, evidence for imbalance,  deviates 10-15 in outside 12 in walkway width. Requires more than 9 sec to ambulate 20 ft.   Ambulating Backwards Walks 20 ft, slow speed, abnormal gait pattern, evidence for imbalance, deviates 10-15 in outside 12 in walkway width.   Steps Two feet to a stair, must use rail.   Total Score 8   FGA comment: 8/30                           PT Education - 03/16/17 0952    Education provided Yes   Education Details Discussed fall risk with dynamic activities.   Person(s) Educated Patient   Methods Explanation   Comprehension Verbalized understanding          PT Short Term Goals - 03/17/17 0955      PT SHORT TERM GOAL #1   Title The patient will return demo HEP for R LE strength, dynamic balance activities.   Baseline Target date 04/16/2017   Time 4   Period Weeks     PT SHORT TERM GOAL #2   Title The patient will improve gait speed from 2 ft/sec to > or equal to 2.4 ft/sec to demo increasing functional mobility.   Baseline Target date 04/16/2017   Time 4   Period Weeks     PT SHORT TERM GOAL #3   Title The patient will improve FGA from 8/30 to > or equal to 12/30 to demo improving dynamic balance/gait.   Baseline Target date 04/16/2017   Time 4   Period Weeks     PT SHORT TERM GOAL #4   Title The patient will return demo stair negotiation with one handrail, reciprocal pattern, modified indep.   Baseline Target date 04/16/2017   Time 4   Period Weeks  PT Long Term Goals - 03/17/17 1610      PT LONG TERM GOAL #1   Title The patient will be indep with progression of HEP.   Baseline Target date 05/15/2017   Time 8   Period Weeks     PT LONG TERM GOAL #2   Title The patient will improve Gait speed from 2 ft/sec to > or equal to 2.8 ft/sec to demo return to "full community ambulator" classification of gait.   Baseline Target date 05/15/2017   Time 8   Period Weeks     PT LONG TERM GOAL #3   Title The patient will improve FGA from 8/30 up to 15/30  to demo improving dynamic balance activities.   Baseline Target date 05/15/2017   Time 8   Period Weeks     PT LONG TERM GOAL #4   Title Improve neuro QOL LE from 44.4% up to 60% to demo improving self perception of disability.   Baseline Target date 05/15/2017   Time 8   Period Weeks     PT LONG TERM GOAL #5   Title The patient will improve R LE strength to 5/5.   Baseline Target date 05/15/2017   Time 8   Period Weeks               Plan - 03/17/17 1002    Clinical Impression Statement The patient is a 41 year old female s/p CVA 02/25/17 with R UE/LE weakness, decreased dynamic gait/balance, and recent falls (4 since d/c from hospital).  PT educated patient on safety during dynamic tasks and recommended slower pace and being aware of placement of R LE due to sensory deficits.  PT to work towards STGs/LTGS to optimize functional status.   Rehab Potential Good   PT Frequency 3x / week   PT Duration 4 weeks  followed by 2x/week for 4 weeks   PT Treatment/Interventions ADLs/Self Care Home Management;Therapeutic activities;Therapeutic exercise;Balance training;Neuromuscular re-education;Gait training;Stair training;Patient/family education;Functional mobility training   PT Next Visit Plan Establish HEP for R LE strengthening, balance, dynamic activities.   PT Home Exercise Plan     Consulted and Agree with Plan of Care Patient      Patient will benefit from skilled therapeutic intervention in order to improve the following deficits and impairments:  Abnormal gait, Difficulty walking, Decreased activity tolerance, Decreased balance, Decreased mobility, Decreased strength, Impaired sensation  Visit Diagnosis: Other abnormalities of gait and mobility  Muscle weakness (generalized)  Unsteadiness on feet     Problem List Patient Active Problem List   Diagnosis Date Noted  . Visual disturbance as complication of stroke 03/07/2017  . Adjustment disorder with anxious mood   .  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 Sickle cell trait (HCC)   . Hb-SS disease with crisis (HCC)   . Hyperlipidemia   . Thyroid nodule   . Changes in vision   . Acute ischemic stroke (HCC)   . Dysphagia, post-stroke   . Benign essential HTN   . Polycystic kidney disease   . Prediabetes   . Acute intractable headache   . Leukocytosis   . Stroke (cerebrum) (HCC) 02/25/2017    Denzel Etienne, PT 03/17/2017, 10:06 AM  [facility] [address] [place], [place], [address] Phone: [phone]   Fax:  [phone]  Name: Sharon Chan MRN: [id number] Date of Birth: Apr 13, 1976

## 2017-03-17 NOTE — Progress Notes (Signed)
Patient ID: Sharon Chan, female    DOB: 05/10/1976, 41 y.o.   MRN: [id number]  PCP: Joaquin CourtsKimberly Cyndee Giammarco, FNP  Chief Complaint  Patient presents with  . Establish Care  . Hospitalization Follow-up    Patient had a stroke    Subjective:  HPI Sharon Chan is a 41 y.o. female presents to establish care today.  Chronic medical problems include: Recent embolic stroke,sickle cell trait disease w/ pain syndrome, PCOS, prediabetes, thyroid nodule, hyperlipidemia.  Sharon Chan is new to the [facility] although recent hemoglobinopathy confirmed that she does not have sickle cell disease. She is a carrier of sickle trait and has lived under the impression for more than 20 years that she had Sickle Cell disease.  She is here today to establish care after suffering from a recent right embolic stroke 02/25/17. Sharon Chan reports that she suffered a sudden onset headache while at home that worsened over time. She developed left sided weakness and numbness, she called EMS and was [facility] where MR brain confirmed both acute infarcts and chronic vascular changes. (See MRI brain 02/25/17 for extensive report).   Patient was hospitalized for 5 days and subsequently discharged to inpatient rehabilitation. At present she is in outpatient rehabilitation as she has suffered some residual deficits with right sided weakness great than left, numbness and tingling of extremities, 10/10 headaches, diminished vision (worst right eye), imbalance with walking, difficulty swallowing, worsening of chronic pain (bilateral hands, hips, and legs), and memory impairment. Major concern is with continued headaches and extremity pain. She is taking tramadol around the clock as scheduled which is minimally effective in relieving extremity pain but does nothing to improve headache pain.  Stroke risk included: obesity and unontrolled elevated blood pressure  without a diagnoses of hypertension and sedentary lifestyle. No history of smoking, alcohol or substance abuse.  She was recently diagnosed as prediabetic with a recent A1C 6.3. Not currently prescribed metformin. Patient also has an associated diagnosis of PCOS and obesity. Another non-emergent but important finding,a left thyroid nodule 18 mm thyroid nodule was identified on CT Angio of the neck during recent admission. Denies any prior abnormal thyroid function test.  Social History   Social History  . Marital status: Married    Spouse name: N/A  . Number of children: N/A  . Years of education: N/A   Occupational History  . Not on file.   Social History Main Topics  . Smoking status: Never Smoker  . Smokeless tobacco: Never Used  . Alcohol use No  . Drug use: Unknown  . Sexual activity: Not on file   Other Topics Concern  . Not on file   Social History Narrative  . No narrative on file    Family History  Problem Relation Age of Onset  . Heart attack Mother   . Post-traumatic stress disorder Father     committed suicide  . Diabetes Father   . Hypertension Sister    Review of Systems See HPI  Patient Active Problem List   Diagnosis Date Noted  . Visual disturbance as complication of stroke 03/07/2017  . Adjustment disorder with anxious mood   . 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 Sickle cell trait (HCC)   . Hb-SS disease with crisis (HCC)   . Hyperlipidemia   . Thyroid nodule   . Changes in vision   . Acute ischemic stroke (HCC)   .  Dysphagia, post-stroke   . Benign essential HTN   . Polycystic kidney disease   . Prediabetes   . Acute intractable headache   . Leukocytosis   . Stroke (cerebrum) (HCC) 02/25/2017    No Known Allergies  Prior to Admission medications   Medication Sig Start Date End Date Taking? Authorizing Provider  acetaminophen (TYLENOL) 325 MG tablet Take 1-2 tablets (325-650 mg total) by mouth  every 4 (four) hours as needed for mild pain. 03/09/17  Yes Evlyn Kanner Love, PA-C  aspirin 325 MG tablet Take 1 tablet (325 mg total) by mouth daily. 03/10/17  Yes Evlyn Kanner Love, PA-C  atorvastatin (LIPITOR) 40 MG tablet Take 1 tablet (40 mg total) by mouth daily at 6 PM. 03/10/17  Yes Evlyn Kanner Love, PA-C  cyclobenzaprine (FLEXERIL) 5 MG tablet Take 1 tablet (5 mg total) by mouth 3 (three) times daily as needed for muscle spasms. Shoulder/neck pain 03/10/17  Yes Evlyn Kanner Love, PA-C  MAGNESIUM PO Take 1 tablet by mouth at bedtime.   Yes Historical Provider, MD  naphazoline-glycerin (CLEAR EYES) 0.012-0.2 % SOLN Place 1-2 drops into both eyes 4 (four) times daily -  with meals and at bedtime. 03/10/17  Yes Evlyn Kanner Love, PA-C  senna-docusate (SENOKOT-S) 8.6-50 MG tablet Take 2 tablets by mouth at bedtime. 03/10/17  Yes Evlyn Kanner Love, PA-C  topiramate (TOPAMAX) 100 MG tablet Take 1 tablet (100 mg total) by mouth 2 (two) times daily. 03/10/17  Yes Evlyn Kanner Love, PA-C  traMADol (ULTRAM) 50 MG tablet Take 1-2 tablets (50-100 mg total) by mouth every 6 (six) hours as needed for moderate pain. 03/10/17  Yes Evlyn Kanner Love, PA-C  vitamin B-12 (CYANOCOBALAMIN) 100 MCG tablet Take 1 tablet (100 mcg total) by mouth daily. 03/10/17  Yes Jacquelynn Cree, PA-C    Past Medical, Surgical Family and Social History reviewed and updated.    Objective:   Today's Vitals   03/17/17 1350  BP: (!) 140/92  Pulse: 95  Resp: 16  Temp: 99.1 F (37.3 C)  TempSrc: Oral  SpO2: 99%  Weight: 219 lb (99.3 kg)  Height: 5\' 3"  (1.6 m)    Wt Readings from Last 3 Encounters:  03/17/17 219 lb (99.3 kg)  03/08/17 242 lb 11.6 oz (110.1 kg)  02/25/17 226 lb 9.6 oz (102.8 kg)   Physical Exam  Constitutional: She appears well-developed and well-nourished.  HENT:  Head: Normocephalic and atraumatic.  Right Ear: External ear normal.  Left Ear: External ear normal.  Nose: Nose normal.  Mouth/Throat: Oropharynx is clear and moist.   Eyes:  Sluggish pupil reaction  Neck: Normal range of motion. Neck supple.  Neck fullness   Cardiovascular: Normal rate, regular rhythm, normal heart sounds and intact distal pulses.   Pulmonary/Chest: Effort normal and breath sounds normal.  Neurological: She is alert. She displays tremor. A sensory deficit is present. No cranial nerve deficit. Coordination and gait abnormal. GCS eye subscore is 4. GCS verbal subscore is 5. GCS motor subscore is 6.  Skin: Skin is warm and dry.  Psychiatric: She has a normal mood and affect. Her behavior is normal. Judgment and thought content normal.      Assessment & Plan:  1. 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 Pearlean Brownie 03/23/17 -Continue Lipitor 2. History of cerebrovascular accident (CVA) with residual deficit -Patient is currently admitted to outpatient rehabilitation and will be receiving a speech therapy to improve swallowing. -Continue  Lipitor, ASA, and I am starting you on Lisinopril today for blood pressure control.  3. Thyroid nodule - US Soft Tissue Head/Neck -Thyroid panel pending  4. Essential Hypertension -Start lisinopril 10 mg once daily  5. Increased Severity of Headaches  -Butalbital-acetaminophen-caffeine (50-325-40-30 MG capsule, 1 capsule every 4 hours as needed for headaches  6. Chronic Pain -Continue cyclobenzaprine and tramadol  as needed. -I am adding Gabapentin  300 mg in morning and 600 mg at bedtime.  7. Prediabetes, A1C 6.3 -Start Metformin 500 mg 2 times daily  RTC: 1 month for hypertension and pain follow-up I will contact once I receive the results from thyroid ultrasound   Godfrey Pick. Tiburcio Pea, MSN, [facility] [address] [place], [place] [address] [phone]

## 2017-03-17 NOTE — Patient Instructions (Addendum)
For blood pressure, start Lisinopril 10 mg once daily.  Check blood pressure once daily and keep a log of your readings to bring to next visit.  If blood pressure is greater than 160/90, please notify me here at the office.  I have ordered your ultrasound of neck to evaluate thyroid nodule.  I am starting you on Metformin today for prediabetes, A1C 6.3 and this will help with your PCOS as well.   For chronic pain and headaches  -I am adding Gabapentin 300 mg in the morning and 600 mg at bedtime.  -For headaches I am starting you on Fioricet 1 tablet , every 4 hours as needed.     Prediabetes Prediabetes is the condition of having a blood sugar (blood glucose) level that is higher than it should be, but not high enough for you to be diagnosed with type 2 diabetes. Having prediabetes puts you at risk for developing type 2 diabetes (type 2 diabetes mellitus). Prediabetes may be called impaired glucose tolerance or impaired fasting glucose. Prediabetes usually does not cause symptoms. Your health care provider can diagnose this condition with blood tests. You may be tested for prediabetes if you are overweight and if you have at least one other risk factor for prediabetes. Risk factors for prediabetes include:  Having a family member with type 2 diabetes.  Being overweight or obese.  Being older than age 64.  Being of American-Indian, African-American, Hispanic/Latino, or Asian/Pacific Islander descent.  Having an inactive (sedentary) lifestyle.  Having a history of gestational diabetes or polycystic ovarian syndrome (PCOS).  Having low levels of good cholesterol (HDL-C) or high levels of blood fats (triglycerides).  Having high blood pressure. What is blood glucose and how is blood glucose measured?   Blood glucose refers to the amount of glucose in your bloodstream. Glucose comes from eating foods that contain sugars and starches (carbohydrates) that the body breaks down into  glucose. Your blood glucose level may be measured in mg/dL (milligrams per deciliter) or mmol/L (millimoles per liter).Your blood glucose may be checked with one or more of the following blood tests:  A fasting blood glucose (FBG) test. You will not be allowed to eat (you will fast) for at least 8 hours before a blood sample is taken.  A normal range for FBG is 70-100 mg/dl (0.9-8.1 mmol/L).  An A1c (hemoglobin A1c) blood test. This test provides information about blood glucose control over the previous 2?3months.  An oral glucose tolerance test (OGTT). This test measures your blood glucose twice:  After fasting. This is your baseline level.  Two hours after you drink a beverage that contains glucose. You may be diagnosed with prediabetes:  If your FBG is 100?125 mg/dL (1.9-1.4 mmol/L).  If your A1c level is 5.7?6.4%.  If your OGGT result is 140?199 mg/dL (7.8-29 mmol/L). These blood tests may be repeated to confirm your diagnosis. What happens if blood glucose is too high? The pancreas produces a hormone (insulin) that helps move glucose from the bloodstream into cells. When cells in the body do not respond properly to insulin that the body makes (insulin resistance), excess glucose builds up in the blood instead of going into cells. As a result, high blood glucose (hyperglycemia) can develop, which can cause many complications. This is a symptom of prediabetes. What can happen if blood glucose stays higher than normal for a long time? Having high blood glucose for a long time is dangerous. Too much glucose in your blood can damage your  nerves and blood vessels. Long-term damage can lead to complications from diabetes, which may include:  Heart disease.  Stroke.  Blindness.  Kidney disease.  Depression.  Poor circulation in the feet and legs, which could lead to surgical removal (amputation) in severe cases. How can prediabetes be prevented from turning into type 2 diabetes?    To help prevent type 2 diabetes, take the following actions:  Be physically active.  Do moderate-intensity physical activity for at least 30 minutes on at least 5 days of the week, or as much as told by your health care provider. This could be brisk walking, biking, or water aerobics.  Ask your health care provider what activities are safe for you. A mix of physical activities may be best, such as walking, swimming, cycling, and strength training.  Lose weight as told by your health care provider.  Losing 5-7% of your body weight can reverse insulin resistance.  Your health care provider can determine how much weight loss is best for you and can help you lose weight safely.  Follow a healthy meal plan. This includes eating lean proteins, complex carbohydrates, fresh fruits and vegetables, low-fat dairy products, and healthy fats.  Follow instructions from your health care provider about eating or drinking restrictions.  Make an appointment to see a diet and nutrition specialist (registered dietitian) to help you create a healthy eating plan that is right for you.  Do not smoke or use any tobacco products, such as cigarettes, chewing tobacco, and e-cigarettes. If you need help quitting, ask your health care provider.  Take over-the-counter and prescription medicines as told by your health care provider. You may be prescribed medicines that help lower the risk of type 2 diabetes. This information is not intended to replace advice given to you by your health care provider. Make sure you discuss any questions you have with your health care provider. Document Released: 02/22/2016 Document Revised: 04/07/2016 Document Reviewed: 12/22/2015 Elsevier Interactive Patient Education  2017 [employer]

## 2017-03-18 LAB — COMPLETE METABOLIC PANEL WITHOUT GFR
ALT: 19 U/L (ref 6–29)
AST: 17 U/L (ref 10–30)
Albumin: 4.1 g/dL (ref 3.6–5.1)
Alkaline Phosphatase: 71 U/L (ref 33–115)
BUN: 15 mg/dL (ref 7–25)
CO2: 21 mmol/L (ref 20–31)
Calcium: 9.2 mg/dL (ref 8.6–10.2)
Chloride: 110 mmol/L (ref 98–110)
Creat: 0.98 mg/dL (ref 0.50–1.10)
GFR, Est African American: 82 mL/min
GFR, Est Non African American: 71 mL/min
Glucose, Bld: 101 mg/dL — ABNORMAL HIGH (ref 65–99)
Potassium: 4.2 mmol/L (ref 3.5–5.3)
Sodium: 142 mmol/L (ref 135–146)
Total Bilirubin: 0.3 mg/dL (ref 0.2–1.2)
Total Protein: 6.5 g/dL (ref 6.1–8.1)

## 2017-03-21 LAB — POCT URINALYSIS DIP (DEVICE)
GLUCOSE, UA: NEGATIVE mg/dL
HGB URINE DIPSTICK: NEGATIVE
Ketones, ur: NEGATIVE mg/dL
Nitrite: NEGATIVE
PROTEIN: 30 mg/dL — AB
UROBILINOGEN UA: 0.2 mg/dL (ref 0.0–1.0)
pH: 5.5 (ref 5.0–8.0)

## 2017-03-23 ENCOUNTER — Ambulatory Visit (INDEPENDENT_AMBULATORY_CARE_PROVIDER_SITE_OTHER): Payer: Medicare Other | Admitting: Neurology

## 2017-03-23 ENCOUNTER — Encounter: Payer: Self-pay | Admitting: Neurology

## 2017-03-23 DIAGNOSIS — I639 Cerebral infarction, unspecified: Secondary | ICD-10-CM | POA: Diagnosis not present

## 2017-03-23 DIAGNOSIS — I69351 Hemiplegia and hemiparesis following cerebral infarction affecting right dominant side: Secondary | ICD-10-CM | POA: Diagnosis not present

## 2017-03-23 DIAGNOSIS — I6381 Other cerebral infarction due to occlusion or stenosis of small artery: Secondary | ICD-10-CM

## 2017-03-23 MED ORDER — TOPIRAMATE 100 MG PO TABS: 100.0000 mg | ORAL_TABLET | Freq: Two times a day (BID) | ORAL | 3 refills | Status: DC

## 2017-03-23 NOTE — Patient Instructions (Signed)
I had a long d/w patient about her recent lacunar stroke, risk for recurrent stroke/TIAs, personally independently reviewed imaging studies and stroke evaluation results and answered questions.Continue aspirin 325 mg daily  for secondary stroke prevention and maintain strict control of hypertension with blood pressure goal below 130/90, diabetes with hemoglobin A1c goal below 6.5% and lipids with LDL cholesterol goal below 70 mg/dL. I also advised the patient to eat a healthy diet with plenty of whole grains, cereals, fruits and vegetables, exercise regularly and maintain ideal body weight . I advised her to increase her Topamax 200 mg in the morning and 200 mg at night to help with her From Migraine Headaches and to Limit Using Fioricet and Tramadol to Avoid Analgesic Rebound. She Will Return for Follow-Up in the Future in 3 Months with My Nurse Practitioner or Call Earlier If Necessary    Stroke Prevention Some medical conditions and behaviors are associated with an increased chance of having a stroke. You may prevent a stroke by making healthy choices and managing medical conditions. How can I reduce my risk of having a stroke?  Stay physically active. Get at least 30 minutes of activity on most or all days.  Do not smoke. It may also be helpful to avoid exposure to secondhand smoke.  Limit alcohol use. Moderate alcohol use is considered to be:  No more than 2 drinks per day for men.  No more than 1 drink per day for nonpregnant women.  Eat healthy foods. This involves:  Eating 5 or more servings of fruits and vegetables a day.  Making dietary changes that address high blood pressure (hypertension), high cholesterol, diabetes, or obesity.  Manage your cholesterol levels.  Making food choices that are high in fiber and low in saturated fat, trans fat, and cholesterol may control cholesterol levels.  Take any prescribed medicines to control cholesterol as directed by your health care  provider.  Manage your diabetes.  Controlling your carbohydrate and sugar intake is recommended to manage diabetes.  Take any prescribed medicines to control diabetes as directed by your health care provider.  Control your hypertension.  Making food choices that are low in salt (sodium), saturated fat, trans fat, and cholesterol is recommended to manage hypertension.  Ask your health care provider if you need treatment to lower your blood pressure. Take any prescribed medicines to control hypertension as directed by your health care provider.  If you are 73-19 years of age, have your blood pressure checked every 3-5 years. If you are 6 years of age or older, have your blood pressure checked every year.  Maintain a healthy weight.  Reducing calorie intake and making food choices that are low in sodium, saturated fat, trans fat, and cholesterol are recommended to manage weight.  Stop drug abuse.  Avoid taking birth control pills.  Talk to your health care provider about the risks of taking birth control pills if you are over 77 years old, smoke, get migraines, or have ever had a blood clot.  Get evaluated for sleep disorders (sleep apnea).  Talk to your health care provider about getting a sleep evaluation if you snore a lot or have excessive sleepiness.  Take medicines only as directed by your health care provider.  For some people, aspirin or blood thinners (anticoagulants) are helpful in reducing the risk of forming abnormal blood clots that can lead to stroke. If you have the irregular heart rhythm of atrial fibrillation, you should be on a blood thinner unless there  is a good reason you cannot take them.  Understand all your medicine instructions.  Make sure that other conditions (such as anemia or atherosclerosis) are addressed. Get help right away if:  You have sudden weakness or numbness of the face, arm, or leg, especially on one side of the body.  Your face or eyelid  droops to one side.  You have sudden confusion.  You have trouble speaking (aphasia) or understanding.  You have sudden trouble seeing in one or both eyes.  You have sudden trouble walking.  You have dizziness.  You have a loss of balance or coordination.  You have a sudden, severe headache with no known cause.  You have new chest pain or an irregular heartbeat. Any of these symptoms may represent a serious problem that is an emergency. Do not wait to see if the symptoms will go away. Get medical help at once. Call your local emergency services (911 in [place] Do not drive yourself to the hospital. This information is not intended to replace advice given to you by your health care provider. Make sure you discuss any questions you have with your health care provider. Document Released: 12/08/2004 Document Revised: 04/07/2016 Document Reviewed: 05/03/2013 Elsevier Interactive Patient Education  2017 [employer]

## 2017-03-23 NOTE — Progress Notes (Signed)
[facility] [address] [place]. [place] [address] [phone]       OFFICE FOLLOW-UP NOTE  Sharon. Sharon Chan Date of Birth:  [birth year] Medical Record Number:  [id number]   HPI: Sharon Chan is a 41 year lady seen today for the first office follow-up visit for hospital admission for stroke in April 2018. History is up 10 from the patient, review of electronic medical records and have personally reviewed imaging films.Sharon R Richardsonis a 41 y.o.female who states that yesterday she had some numbness in the right leg around the knee causing her leg to buckle. She thought it was because she was walking too much and ignored it. This morning she woke up at 8 am feeling OK overall except for some possible mild right face and hand numbness. She got out of bed at 11 am and then noticed that she has significant right sided numbness and tingling. Upon arrival to the ER she had very high BP. No prior history of hypertension. 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 There was a hypodensity in the left thalamus of unclear age.  CTA Brain showed multiple intracranial stenosis which could be [place] disease related vs other differentials such as CNS vasculitis. CTA Neck was normal. She was a vague historian and her neurological was bizarre in presentation as it was associated with confusion and encephalopathy. Therefore, I did a stat MRI Brain which showed an acute infarct in the left thalamus. MRA showed similar beading of intracranial vessels.She was not taking any medications at home, including no antiplatelets. She had not had any major sickle cell crisis for many years. She has no known hyperlipidemia, hypertension, diabetes, and she is not a smoker. She denies illicit drug use. Patient received total of 30 mg IV Labetolol but her BP remained outside of the parameters for IV tPA. MRI scan showed lacunar infarcts involving  left thalamus. Remote age multiple lacunar infarcts are noted as well. Keep transthoracic echo showed normal ejection fraction. LDL cholesterol was elevated 135 mg percent. Urine drug screen was negative.Hemoglobin A1c was 6.3.Lab work for  lupus, sarcoidosis, Lyme's and syphilis was normal. HIV antibody was negative. Hypercoagulable panel lab was all normal. Sickle cell trait was positive. Patient had arrhythmias Nedra Hai believed she had sickle cell disease but after the hemoglobin screen was sent the results did not suggest sickle cell disease but sickle cell trait only. Patient has subjective right sided weakness and pain she was given oral pain medications. She complained of headaches which are persistently started on Topamax. She states she is doing well she's still getting her therapy at home and making gradual improvement. She is able to walk with a cane. She still has daily headaches for the last 2 weeks. She takes Fioricet as well as tramadol daily and Topamax 100 mg twice daily. She has had a few falls but has had no injuries. She feels her dizziness markings slowly improving but is not back to baseline. She is tolerating aspirin well without bleeding or bruising. Her blood pressure is well controlled and today it is 1-3/88. She is living at home with his mother her mother..  ROS:   14 system review of systems is positive for  dizziness, numbness, gait difficulty, headache and all other systems negative PMH:  Past Medical History:  Diagnosis Date  . CVA (cerebral vascular accident) (HCC)   . Polycystic disease, ovaries   . Seizures (HCC)   . Sickle  cell trait [facility])     Social History:  Social History   Social History  . Marital status: Married    Spouse name: N/A  . Number of children: 0  . Years of education: N/A   Occupational History  . Not on file.   Social History Main Topics  . Smoking status: Never Smoker  . Smokeless tobacco: Never Used  . Alcohol use No  . Drug use:  Unknown  . Sexual activity: Not on file   Other Topics Concern  . Not on file   Social History Narrative   Lives at home, mom currently staying with her   Left-handed   Caffeine: occasional decaf coffee or raspberry tea    Medications:   Current Outpatient Prescriptions on File Prior to Visit  Medication Sig Dispense Refill  . acetaminophen (TYLENOL) 325 MG tablet Take 1-2 tablets (325-650 mg total) by mouth every 4 (four) hours as needed for mild pain.    Marland Kitchen aspirin 325 MG tablet Take 1 tablet (325 mg total) by mouth daily. 100 tablet 1  . atorvastatin (LIPITOR) 40 MG tablet Take 1 tablet (40 mg total) by mouth daily at 6 PM. 30 tablet 0  . butalbital-acetaminophen-caffeine (FIORICET WITH CODEINE) 50-325-40-30 MG capsule Take 1 capsule by mouth every 4 (four) hours as needed for headache. 30 capsule 0  . cyclobenzaprine (FLEXERIL) 5 MG tablet Take 1 tablet (5 mg total) by mouth 3 (three) times daily as needed for muscle spasms. Shoulder/neck pain 45 tablet 0  . gabapentin (NEURONTIN) 300 MG capsule Take 1 tablet in the morning and 2 tablets at bedtime daily 90 capsule 3  . lisinopril (PRINIVIL,ZESTRIL) 10 MG tablet Take 1 tablet (10 mg total) by mouth daily. 90 tablet 3  . MAGNESIUM PO Take 1 tablet by mouth at bedtime.    . metFORMIN (GLUCOPHAGE) 500 MG tablet Take 1 tablet (500 mg total) by mouth 2 (two) times daily with a meal. 180 tablet 3  . naphazoline-glycerin (CLEAR EYES) 0.012-0.2 % SOLN Place 1-2 drops into both eyes 4 (four) times daily -  with meals and at bedtime.  0  . senna-docusate (SENOKOT-S) 8.6-50 MG tablet Take 2 tablets by mouth at bedtime. 60 tablet 0  . traMADol (ULTRAM) 50 MG tablet Take 1-2 tablets (50-100 mg total) by mouth every 6 (six) hours as needed for moderate pain. 150 tablet 0  . vitamin B-12 (CYANOCOBALAMIN) 100 MCG tablet Take 1 tablet (100 mcg total) by mouth daily. 30 tablet 0   No current facility-administered medications on file prior to visit.      Allergies:  No Known Allergies  Physical Exam General: Obese middle-aged lady, seated, in no evident distress Head: head normocephalic and atraumatic.  Neck: supple with no carotid or supraclavicular bruits Cardiovascular: regular rate and rhythm, no murmurs Musculoskeletal: no deformity Skin:  no rash/petichiae Vascular:  Normal pulses all extremities Vitals:   03/23/17 1631  BP: 123/88  Pulse: 78   Neurologic Exam Mental Status: Awake and fully alert. Oriented to place and time. Recent and remote memory intact. Attention span, concentration and fund of knowledge appropriate. Mood and affect appropriate.  Cranial Nerves: Fundoscopic exam reveals sharp disc margins. Pupils equal, briskly reactive to light. Extraocular movements full without nystagmus. Visual fields full to confrontation. Hearing intact. Facial sensation intact. Mild right lower facial asymmetry. tongue, palate moves normally and symmetrically.  Motor:Mild right hemiparesis 4+/5. Weakness of right grip and intrinsic hand muscles. Mild weakness of right hip vigorous  and ankle dorsiflexors. Tone is increased on the right compared to left. Normal strength on the left.  Sensory.: Subjective diminished to touch ,pinprick .position and vibratory sensation. On the right compared to the left side  Coordination: Rapid alternating movements normal in all extremities. Finger-to-nose and heel-to-shin performed accurately bilaterally. Gait and Station: Arises from chair without difficulty. Stance is normal. Gait demonstrates slight dragging of the right leg and uses a cane  . Not able to heel, toe and tandem walk without difficulty.  Reflexes: 1+ and symmetric. Toes downgoing.   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 History of sickle cell trait    PLAN: I had a long d/w patient about her recent lacunar stroke, risk for recurrent stroke/TIAs, personally independently reviewed imaging studies and stroke evaluation results and answered questions.Continue aspirin 325 mg daily  for secondary stroke prevention and maintain strict control of hypertension with blood pressure goal below 130/90, diabetes with hemoglobin A1c goal below 6.5% and lipids with LDL cholesterol goal below 70 mg/dL. I also advised the patient to eat a healthy diet with plenty of whole grains, cereals, fruits and vegetables, exercise regularly and maintain ideal body weight . I advised her to increase her Topamax 200 mg in the morning and 200 mg at night to help with her From Migraine Headaches and to Limit Using Fioricet and Tramadol to avoid analgesic rebound. She will return for follow-up in 3 months to see my nurse practitioner or call earlier if necessary Greater than 50% of time during this 25 minute visit was spent on counseling,explanation of diagnosis, planning of further management, discussion with patient and family and coordination of care Delia HeadyPramod Sethi, MD  [facility] [address] [place], [place] [id number]  Phone [phone] Fax [phone] Note: This document was prepared with digital dictation and possible smart phrase technology. Any transcriptional errors that result from this process are unintentional

## 2017-03-24 ENCOUNTER — Encounter: Payer: Medicare Other | Admitting: Physical Medicine & Rehabilitation

## 2017-03-27 ENCOUNTER — Telehealth: Payer: Self-pay | Admitting: Oncology

## 2017-03-27 ENCOUNTER — Ambulatory Visit (HOSPITAL_BASED_OUTPATIENT_CLINIC_OR_DEPARTMENT_OTHER): Payer: Medicare Other | Admitting: Nurse Practitioner

## 2017-03-27 VITALS — BP 149/100 | HR 77 | Temp 98.9°F | Resp 18 | Ht 63.0 in | Wt 219.9 lb

## 2017-03-27 DIAGNOSIS — D509 Iron deficiency anemia, unspecified: Secondary | ICD-10-CM | POA: Diagnosis not present

## 2017-03-27 DIAGNOSIS — I10 Essential (primary) hypertension: Secondary | ICD-10-CM

## 2017-03-27 DIAGNOSIS — I639 Cerebral infarction, unspecified: Secondary | ICD-10-CM | POA: Diagnosis not present

## 2017-03-27 DIAGNOSIS — D573 Sickle-cell trait: Secondary | ICD-10-CM | POA: Diagnosis not present

## 2017-03-27 NOTE — Telephone Encounter (Signed)
Gave patient AVS and calender per 5/14 los. - [facility]

## 2017-03-27 NOTE — Progress Notes (Addendum)
  [facility] OFFICE PROGRESS NOTE   Diagnosis:  Sickle cell trait  INTERVAL HISTORY:   [name] returns for her first outpatient appointment with Dr. Truett PernaSherrill since hospital discharge. She was admitted in April of this year with a CVA. She provided a clinical history consistent with a mild form of sickle cell anemia. Hemoglobin electrophoresis revealed sickle cell trait. 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 Testing was submitted for alpha globin gene mapping.   She notes significant improvement in right-sided strength. She is ambulating with a cane. She is beginning outpatient physical therapy 3 times a week. She reports stable chronic pain involving the hips and extremities.  Objective:  Vital signs in last 24 hours:  Blood pressure (!) 149/100, pulse 77, temperature 98.9 F (37.2 C), temperature source Oral, resp. rate 18, height 5\' 3"  (1.6 m), weight 219 lb 14.4 oz (99.7 kg), SpO2 100 %.    HEENT: No thrush or ulcers. Resp: Lungs clear bilaterally. Cardio: Regular rate and rhythm. GI: Abdomen soft and nontender. No hepatomegaly. Vascular: No leg edema. Neuro: Alert and oriented. Very mild decrease in strength on the right side. Port-A-Cath without erythema.  Lab Results:  Lab Results  Component Value Date   WBC 6.6 03/17/2017   HGB 13.0 03/17/2017   HCT 38.7 03/17/2017   MCV 75.6 (L) 03/17/2017   PLT 312 03/17/2017   NEUTROABS 2,772 03/17/2017    Imaging:  No results found.  Medications: I have reviewed the patient's current medications.  Assessment/Plan: 1. Sickle cell trait 2. Microcytic anemia 3. Acute left thalamic CVA 4. Cerebrovascular disease 5. Polycystic ovarian disease 6. Hypertension 7. Report of sickle cell crises 8. Port-A-Cath   Disposition: [name] appears stable. She continues to recover from the recent  acute left CVA.   Hemoglobin electrophoresis obtained during the hospitalization last month shows sickle cell trait. Alpha globin gene mapping shows no deletions. Dr. Truett PernaSherrill reviewed these results with her at today's visit and recommends [facility], question coexisting thalassemia variant, question episodes of crisis like pain.   She will return for a follow-up visit in one month. She will contact the office in the interim with any problems. We will discuss removal of the Port-A-Cath pending evaluation by Dr. Sherwood GamblerAtaga.   Patient seen with Dr. Truett PernaSherrill. 25 minutes were spent face-to-face at today's visit with the majority of that time involved in counseling/coordination of care.  Lonna Cobbhomas, Jonte Shiller ANP/GNP-BC   03/27/2017  2:51 PM  This was a shared visit with Lonna CobbLisa Vin Yonke. [name] is recovering from the CVA. She has sickle cell trait. Alpha globin testing returned negative. The red cell microcytosis may indicate coexisting beta thalassemia trait. It is unclear whether the symptoms she has experienced intermittently are related to a "sickle crisis "or another etiology. We will refer her to Dr. Sherwood GamblerAtaga to consider the diagnosis of a coexisting thalassemia variant and the potential for developing a sickle cell crisis in her case.  Mancel BaleBrad Sherrill, M.D.

## 2017-03-28 ENCOUNTER — Ambulatory Visit: Payer: Medicare Other | Admitting: Physical Therapy

## 2017-03-28 ENCOUNTER — Ambulatory Visit: Payer: Medicare Other | Admitting: Occupational Therapy

## 2017-03-28 ENCOUNTER — Encounter: Payer: Self-pay | Admitting: Physical Therapy

## 2017-03-28 VITALS — BP 138/76

## 2017-03-28 DIAGNOSIS — R41842 Visuospatial deficit: Secondary | ICD-10-CM

## 2017-03-28 DIAGNOSIS — R208 Other disturbances of skin sensation: Secondary | ICD-10-CM

## 2017-03-28 DIAGNOSIS — I69319 Unspecified symptoms and signs involving cognitive functions following cerebral infarction: Secondary | ICD-10-CM

## 2017-03-28 DIAGNOSIS — M6281 Muscle weakness (generalized): Secondary | ICD-10-CM

## 2017-03-28 DIAGNOSIS — R278 Other lack of coordination: Secondary | ICD-10-CM | POA: Diagnosis not present

## 2017-03-28 DIAGNOSIS — R2689 Other abnormalities of gait and mobility: Secondary | ICD-10-CM

## 2017-03-28 DIAGNOSIS — I69353 Hemiplegia and hemiparesis following cerebral infarction affecting right non-dominant side: Secondary | ICD-10-CM

## 2017-03-28 DIAGNOSIS — R41840 Attention and concentration deficit: Secondary | ICD-10-CM

## 2017-03-28 DIAGNOSIS — R2681 Unsteadiness on feet: Secondary | ICD-10-CM

## 2017-03-28 NOTE — Therapy (Signed)
[facility] [address] [place], [place], [address] Phone: [phone]   Fax:  [phone]  Occupational Therapy Evaluation  Patient Details  Name: Sharon Chan MRN: [id number] Date of Birth: [birth year] Referring Provider: Dr. Claudette Laws  Encounter Date: 03/28/2017      OT End of Session - 03/28/17 0813    Visit Number 2   Number of Visits 21   Date for OT Re-Evaluation 05/15/17   Authorization Type Medicare/Medicaid; g-code needed   Authorization - Visit Number 2   Authorization - Number of Visits 10   OT Start Time 0803   OT Stop Time 0845   OT Time Calculation (min) 42 min   Activity Tolerance Patient tolerated treatment well   Behavior During Therapy [facility] for tasks assessed/performed      Past Medical History:  Diagnosis Date  . CVA (cerebral vascular accident) (HCC)   . Polycystic disease, ovaries   . Seizures (HCC)   . Sickle cell trait [facility])     Past Surgical History:  Procedure Laterality Date  . LAPAROSCOPIC CHOLECYSTECTOMY      There were no vitals filed for this visit.      Subjective Assessment - 03/28/17 0803    Subjective  Pt reports having a couple of falls since eval (1 in shower).  Pt reports 1 fall into shower when getting up from toilet.  Pt reports that some of her new meds cause dizziness.   Pertinent History L CVA 02/25/17; HTN, polycystic kidney disease, hyperlipidemia, sickle cell trait, scans showed 2 old CVAs, sickle cell trait, prediabetes    Limitations no driving, fall risk, visual deficits   Currently in Pain? Yes   Pain Score 10-Worst pain ever   Pain Location Head   Pain Descriptors / Indicators Headache   Pain Type Acute pain   Pain Onset 1 to 4 weeks ago   Pain Frequency Constant   Aggravating Factors  migraine, light   Pain Relieving Factors unknown       Education regarding fall prevention/incr safety:  Recommended pt sit when getting dressed,  continue to sit for shower, not carry items outside, not carry anything longer distances, avoid sharp/heavy/hot items with RUE, recommended against cooking right now.  Pt verbalized understanding.  Reviewed CVA symptoms.  Pt able to verbalize with min cues.                       OT Education - 03/28/17 0827    Education Details Coordination and Red Putty HEP--see pt instructions   Person(s) Educated Patient   Methods Explanation;Handout;Demonstration;Verbal cues   Comprehension Verbalized understanding;Returned demonstration;Verbal cues required          OT Short Term Goals - 03/16/17 1728      OT SHORT TERM GOAL #1   Title Pt will be independent with initial HEP.--check STGs 04/15/17   Time 4   Period Weeks   Status New     OT SHORT TERM GOAL #2   Title Pt will improve R hand coordination/functional reaching as shown by improving score on box and blocks test by at least 10.   Baseline R-28blocks, L-52blocks   Time 4   Period Weeks   Status New     OT SHORT TERM GOAL #3   Title Pt will improve R hand coordination for ADLs as shown by improving time on 9-hole peg test by at least 10sec.   Baseline R-57.19sec   Time  4   Period Weeks   Status New     OT SHORT TERM GOAL #4   Title Pt will perform tabletop visual scanning with at least 95% accuracy with incr time.   Time 4   Period Weeks   Status New     OT SHORT TERM GOAL #5   Title Pt will verbalize understanding of memory/visual compensation strategies.   Time 4   Period Weeks   Status New     Additional Short Term Goals   Additional Short Term Goals Yes     OT SHORT TERM GOAL #6   Title Pt will improve R grip strength by at least 8lbs to assist with ADLs.   Baseline R-18lbs   Time 4   Period Weeks   Status New     OT SHORT TERM GOAL #7   Title Pt will perform dressing/bathing without falls x 2 weeks utilizing safety strategies (supervision).   Time 4   Period Weeks   Status New            OT Long Term Goals - 03/16/17 1738      OT LONG TERM GOAL #1   Title Pt will be independent with updated HEP.--check LTGs 05/15/17   Time 8   Period Weeks   Status New     OT LONG TERM GOAL #2   Title Pt will perform simple cooking/home maintenance tasks mod I.   Time 8   Period Weeks   Status New     OT LONG TERM GOAL #3   Title Pt will improve coordination for ADLs as shown by improving time on 9-hole peg test by at least 20sec with RUE.   Baseline R-57.19sec   Time 8   Period Weeks   Status New     OT LONG TERM GOAL #4   Title Pt will perform environmental scanning with at least 90% accuracy for incr safety in the community.   Time 8   Period Weeks   Status New     OT LONG TERM GOAL #5   Title Pt will improve R grip strength by at least 15lbs to assist with ADLs.   Baseline R-18lbs   Time 8   Period Weeks   Status New     Long Term Additional Goals   Additional Long Term Goals Yes     OT LONG TERM GOAL #6   Title Pt will be able to retrieve 3lb object from overhead shelf x3 safely with RUE.   Time 8   Period Weeks   Status New               Plan - 03/28/17 0818    Clinical Impression Statement Pt reports incr IADL performance and that she is playing board/card games at home.  Pt is progressing toward goals.  Pt also appears to demo improved attention.   Rehab Potential Good   OT Frequency 3x / week  eval + 3x/wk for 4 weeks followed by 2x week for 4 weeks   OT Duration 8 weeks   OT Treatment/Interventions Self-care/ADL training;Moist Heat;Fluidtherapy;DME and/or AE instruction;Splinting;Patient/family education;Balance training;Therapeutic exercises;Contrast Bath;Ultrasound;Therapeutic exercise;Therapeutic activities;Cognitive remediation/compensation;Passive range of motion;Functional Mobility Training;Neuromuscular education;Cryotherapy;Electrical Stimulation;Parrafin;Energy conservation;Manual Therapy;Visual/perceptual remediation/compensation    Plan memory/visual compensation strategies; safety for IADLs, visual scanning   OT Home Exercise Plan Education provided:  03/28/17 coordination HEP, red putty HEP    Consulted and Agree with Plan of Care Patient      Patient will benefit  from skilled therapeutic intervention in order to improve the following deficits and impairments:  Decreased coordination, Impaired sensation, Decreased range of motion, Decreased safety awareness, Decreased activity tolerance, Decreased balance, Decreased knowledge of use of DME, Impaired UE functional use, Impaired vision/preception, Impaired perceived functional ability, Decreased strength, Decreased mobility, Decreased cognition  Visit Diagnosis: Hemiplegia and hemiparesis following cerebral infarction affecting right non-dominant side (HCC)  Unsteadiness on feet  Muscle weakness (generalized)  Other abnormalities of gait and mobility  Other disturbances of skin sensation  Other lack of coordination  Attention and concentration deficit  Unspecified symptoms and signs involving cognitive functions following cerebral infarction  Visuospatial deficit    Problem List Patient Active Problem List   Diagnosis Date Noted  . Hemiparesis affecting right side as late effect of cerebrovascular accident (HCC) 03/23/2017  . Lacunar infarct, acute (HCC) 03/23/2017  . Visual disturbance as complication of stroke 03/07/2017  . Adjustment disorder with anxious mood   . 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 Sickle cell trait (HCC)   . Hb-SS disease with crisis (HCC)   . Hyperlipidemia   . Thyroid nodule   . Changes in vision   . Acute ischemic stroke (HCC)   . Dysphagia, post-stroke   . Benign essential HTN   . Polycystic kidney disease   . Prediabetes   . Acute intractable headache   . Leukocytosis   . Stroke (cerebrum) [facility](HCC) 02/25/2017    [facility] HospitalFREEMAN,Bethel Gaglio 03/28/2017, 8:44 AM  [facility] [address] [place], [place], [address] Phone: [phone]   Fax:  [phone]  Name: Sharon Chan MRN: [id number] Date of Birth: 11/19/1975   Willa FraterAngela Amarilys Lyles, OTR/L [facility] [address] [place], [place]  [address] [phone] phone [phone] 03/28/17 8:44 AM

## 2017-03-28 NOTE — Patient Instructions (Addendum)
Functional Quadriceps: Sit to Stand    Sit on edge of chair, feet flat on floor. Stand upright, extending knees fully. Repeat _10_ times per set. Do _1_ sets per session. Do _1-2_ sessions per day.  http://orth.exer.us/735   Copyright  VHI. All rights reserved.   Hold onto counter or sturdy surface for balance with these: Heel Raises    Stand with support. With knees straight, raise heels off ground. Hold __5_ seconds.  Repeat _10__ times. Do __1-2_ times a day.  Copyright  VHI. All rights reserved.   Toe Raise (Standing)    Rock back on heels raising toes. Hold for 5 seconds.  Repeat ___10_ times per set. Do __1__ sets per session. Do __1-2__ sessions per day.  http://orth.exer.us/43   Copyright  VHI. All rights reserved.  "I love a Parade" Lift    Slow, high knee marching forward and then backwards along counter top. Hold the knee in the air for up to 3 seconds with each march.  Repeat for 3 laps down and back. Do _1-2_ sessions per day.  http://gt2.exer.us/345   Copyright  VHI. All rights reserved.    Feet Heel-Toe "Tandem"    Arms as needed for balance, walk a straight line forward by bringing one foot directly in front of the other and then walk a straight line backwards by bringing one foot directly behind the other one.  Repeat for 3 laps forward/backward. Do _1-2_ sessions per day.  Copyright  VHI. All rights reserved.    Side-Stepping    Walk to left side with eyes open. Take even steps, leading with same foot. Make sure each foot lifts off the floor and keep hips/feet facing the counter top, do not let them rotate.  Repeat in opposite direction. Perform 3 laps toward each side. 1-2 times a day.  Copyright  VHI. All rights reserved.

## 2017-03-28 NOTE — Therapy (Signed)
[facility] [address] [place], [place], [address] Phone: [phone]   Fax:  [phone]  Physical Therapy Treatment  Patient Details  Name: Sharon Chan MRN: [id number] Date of Birth: Aug 14, 1976 Referring Provider: Claudette Laws, MD  Encounter Date: 03/28/2017      PT End of Session - 03/28/17 0859    Visit Number 2   Number of Visits 18   Date for PT Re-Evaluation 05/15/17   Authorization Type G code every 10th visit   PT Start Time 0849   PT Stop Time 0930   PT Time Calculation (min) 41 min   Equipment Utilized During Treatment Gait belt   Activity Tolerance Patient tolerated treatment well   Behavior During Therapy [facility] for tasks assessed/performed      Past Medical History:  Diagnosis Date  . CVA (cerebral vascular accident) (HCC)   . Polycystic disease, ovaries   . Seizures (HCC)   . Sickle cell trait [facility])     Past Surgical History:  Procedure Laterality Date  . LAPAROSCOPIC CHOLECYSTECTOMY      Vitals:   03/28/17 0851  BP: 138/76        Subjective Assessment - 03/28/17 0851    Subjective Has had ~8 falls since eval with PT. Using cane. Denies any injuries with the falls. Reports she feels most falls are due to foot drag on right foot and/or medication (Topamax) that causes her to get dizzy. Has been able to get herself up and does catch herself at times with the falls.                              Pertinent History sickle cell, polycystic ovarian disease, obesity, thyroid nodule   Patient Stated Goals "To get back to a sense of normalcy", improve sensation in her right side and be independent.   Currently in Pain? Yes   Pain Score 10-Worst pain ever   Pain Location Head   Pain Descriptors / Indicators Headache   Pain Type Acute pain   Pain Onset 1 to 4 weeks ago   Pain Frequency Constant   Aggravating Factors  migraine, light   Pain Relieving Factors unknown      Treatment: Issued the following to pt's HEP today: Functional Quadriceps: Sit to Stand    Sit on edge of chair, feet flat on floor. Stand upright, extending knees fully. Repeat _10_ times per set. Do _1_ sets per session. Do _1-2_ sessions per day.  http://orth.exer.us/735   Copyright  VHI. All rights reserved.   Hold onto counter or sturdy surface for balance with these: Heel Raises    Stand with support. With knees straight, raise heels off ground. Hold __5_ seconds.  Repeat _10__ times. Do __1-2_ times a day.  Copyright  VHI. All rights reserved.   Toe Raise (Standing)    Rock back on heels raising toes. Hold for 5 seconds.  Repeat ___10_ times per set. Do __1__ sets per session. Do __1-2__ sessions per day.  http://orth.exer.us/43   Copyright  VHI. All rights reserved.  "I love a Parade" Lift    Slow, high knee marching forward and then backwards along counter top. Hold the knee in the air for up to 3 seconds with each march.  Repeat for 3 laps down and back. Do _1-2_ sessions per day.  http://gt2.exer.us/345   Copyright  VHI. All rights reserved.    Feet Heel-Toe "Tandem"  Arms as needed for balance, walk a straight line forward by bringing one foot directly in front of the other and then walk a straight line backwards by bringing one foot directly behind the other one.  Repeat for 3 laps forward/backward. Do _1-2_ sessions per day.  Copyright  VHI. All rights reserved.    Side-Stepping    Walk to left side with eyes open. Take even steps, leading with same foot. Make sure each foot lifts off the floor and keep hips/feet facing the counter top, do not let them rotate.  Repeat in opposite direction. Perform 3 laps toward each side. 1-2 times a day.  Copyright  VHI. All rights reserved.         PT Education - 03/28/17 0925    Education provided Yes   Education Details HEP for right LE strengthening and balance; fall prevention- use  of AD at all times, going slow with movements (especially right after getting up), and to discuss the dizziness she feels is medication relatated with her MD.   San JettyPerson(s) Educated Patient   Methods Explanation;Demonstration;Verbal cues;Handout   Comprehension Verbalized understanding;Returned demonstration;Verbal cues required;Need further instruction          PT Short Term Goals - 03/17/17 0955      PT SHORT TERM GOAL #1   Title The patient will return demo HEP for R LE strength, dynamic balance activities.   Baseline Target date 04/16/2017   Time 4   Period Weeks     PT SHORT TERM GOAL #2   Title The patient will improve gait speed from 2 ft/sec to > or equal to 2.4 ft/sec to demo increasing functional mobility.   Baseline Target date 04/16/2017   Time 4   Period Weeks     PT SHORT TERM GOAL #3   Title The patient will improve FGA from 8/30 to > or equal to 12/30 to demo improving dynamic balance/gait.   Baseline Target date 04/16/2017   Time 4   Period Weeks     PT SHORT TERM GOAL #4   Title The patient will return demo stair negotiation with one handrail, reciprocal pattern, modified indep.   Baseline Target date 04/16/2017   Time 4   Period Weeks           PT Long Term Goals - 03/17/17 [id number]      PT LONG TERM GOAL #1   Title The patient will be indep with progression of HEP.   Baseline Target date 05/15/2017   Time 8   Period Weeks     PT LONG TERM GOAL #2   Title The patient will improve Gait speed from 2 ft/sec to > or equal to 2.8 ft/sec to demo return to "full community ambulator" classification of gait.   Baseline Target date 05/15/2017   Time 8   Period Weeks     PT LONG TERM GOAL #3   Title The patient will improve FGA from 8/30 up to 15/30 to demo improving dynamic balance activities.   Baseline Target date 05/15/2017   Time 8   Period Weeks     PT LONG TERM GOAL #4   Title Improve neuro QOL LE from 44.4% up to 60% to demo improving self perception of  disability.   Baseline Target date 05/15/2017   Time 8   Period Weeks     PT LONG TERM GOAL #5   Title The patient will improve R LE strength to 5/5.   Baseline Target  date 05/15/2017   Time 8   Period Weeks             Plan - 03/28/17 0859    Clinical Impression Statement Today's skilled session focused on establishment of HEP for home with no issues (no toe scuffing or knee instability noted with standing/gait/exercises)  reported in session. Pt also has had several falls since evalution last week while using her cane, all indoors except for 1 which was outdoors on grass. Notified primary PT and may need to discuss with pt use of a different device for walker to decr fall risk. Pt should benefit from continued PT to progress toward unmet goals.                                          Rehab Potential Good   PT Frequency 3x / week   PT Duration 4 weeks  followed by 2x/week for 4 weeks   PT Treatment/Interventions ADLs/Self Care Home Management;Therapeutic activities;Therapeutic exercise;Balance training;Neuromuscular re-education;Gait training;Stair training;Patient/family education;Functional mobility training   PT Next Visit Plan discuss possible use of RW vs cane for safety with gait; continue to work on gait and strengthening   PT Home Exercise Plan     Consulted and Agree with Plan of Care Patient      Patient will benefit from skilled therapeutic intervention in order to improve the following deficits and impairments:  Abnormal gait, Difficulty walking, Decreased activity tolerance, Decreased balance, Decreased mobility, Decreased strength, Impaired sensation  Visit Diagnosis: Other abnormalities of gait and mobility  Muscle weakness (generalized)  Unsteadiness on feet  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 Hemiparesis affecting right side as late effect of cerebrovascular accident (HCC) 03/23/2017  . Lacunar infarct, acute (HCC) 03/23/2017  . Visual disturbance as complication of stroke 03/07/2017  . Adjustment disorder with anxious mood   . 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 Sickle cell trait (HCC)   . Hb-SS disease with crisis (HCC)   . Hyperlipidemia   . Thyroid nodule   . Changes in vision   . Acute ischemic stroke (HCC)   . Dysphagia, post-stroke   . Benign essential HTN   . Polycystic kidney disease   . Prediabetes   . Acute intractable headache   . Leukocytosis   . Stroke (cerebrum) (HCC) 02/25/2017    Sallyanne Kuster, PTA, [facility] [address], [address] [place], [place] [address] [phone] 03/28/17, 2:42 PM   Name: Sharon Chan MRN: [id number] Date of Birth: 12-Nov-1976

## 2017-03-28 NOTE — Patient Instructions (Addendum)
  Coordination Activities  Perform the following activities for 20 minutes 1 times per day with right hand(s).   Rotate ball in fingertips (clockwise and counter-clockwise).  Toss ball between hands.  Toss ball in air and catch with the same hand.  Flip cards 1 at a time, gradually increase speed.  Deal cards with your thumb (Hold deck in hand and push card off top with thumb).  Shuffle cards.  Pick up coins, buttons, marbles, dried beans/pasta of different sizes and place in container.  Pick up coins and stack.  Pick up coins one at a time until you get 5-10 in your hand, then move coins from palm to fingertips to place in coin bank/container one at a time.  Practice typing.  Squeeze putty with your whole hand 20 times  Roll out putty into log, then pinch putty with each finger/thumb.  Roll out 3-4 times.

## 2017-03-29 ENCOUNTER — Ambulatory Visit: Payer: Medicare Other | Admitting: Physical Therapy

## 2017-03-29 ENCOUNTER — Encounter: Payer: Self-pay | Admitting: Rehabilitative and Restorative Service Providers"

## 2017-03-29 ENCOUNTER — Ambulatory Visit: Payer: Medicare Other | Admitting: Occupational Therapy

## 2017-03-29 ENCOUNTER — Encounter: Payer: Self-pay | Admitting: Physical Therapy

## 2017-03-29 ENCOUNTER — Ambulatory Visit: Payer: Medicare Other

## 2017-03-29 VITALS — BP 120/80

## 2017-03-29 DIAGNOSIS — R208 Other disturbances of skin sensation: Secondary | ICD-10-CM

## 2017-03-29 DIAGNOSIS — R4701 Aphasia: Secondary | ICD-10-CM

## 2017-03-29 DIAGNOSIS — R278 Other lack of coordination: Secondary | ICD-10-CM | POA: Diagnosis not present

## 2017-03-29 DIAGNOSIS — M6281 Muscle weakness (generalized): Secondary | ICD-10-CM | POA: Diagnosis not present

## 2017-03-29 DIAGNOSIS — R41842 Visuospatial deficit: Secondary | ICD-10-CM

## 2017-03-29 DIAGNOSIS — R41840 Attention and concentration deficit: Secondary | ICD-10-CM | POA: Diagnosis not present

## 2017-03-29 DIAGNOSIS — R2689 Other abnormalities of gait and mobility: Secondary | ICD-10-CM

## 2017-03-29 DIAGNOSIS — I69353 Hemiplegia and hemiparesis following cerebral infarction affecting right non-dominant side: Secondary | ICD-10-CM | POA: Diagnosis not present

## 2017-03-29 DIAGNOSIS — R2681 Unsteadiness on feet: Secondary | ICD-10-CM

## 2017-03-29 DIAGNOSIS — I69319 Unspecified symptoms and signs involving cognitive functions following cerebral infarction: Secondary | ICD-10-CM | POA: Diagnosis not present

## 2017-03-29 NOTE — Therapy (Signed)
[facility] [address] [place], [place], [address] Phone: [phone]   Fax:  [phone]  Occupational Therapy Treatment  Patient Details  Name: Sharon Chan MRN: [id number] Date of Birth: 05/27/1976 Referring Provider: Dr. Claudette Laws  Encounter Date: 03/29/2017      OT End of Session - 03/29/17 0859    Visit Number 3   Number of Visits 21   Date for OT Re-Evaluation 05/15/17   Authorization Type Medicare/Medicaid; g-code needed   Authorization - Visit Number 3   Authorization - Number of Visits 10   OT Start Time [phone]   OT Stop Time 0930   OT Time Calculation (min) 39 min   Activity Tolerance Patient tolerated treatment well   Behavior During Therapy [facility] for tasks assessed/performed      Past Medical History:  Diagnosis Date  . CVA (cerebral vascular accident) (HCC)   . Polycystic disease, ovaries   . Seizures (HCC)   . Sickle cell trait [facility])     Past Surgical History:  Procedure Laterality Date  . LAPAROSCOPIC CHOLECYSTECTOMY      There were no vitals filed for this visit.      Subjective Assessment - 03/29/17 0851    Pertinent History L CVA 02/25/17; HTN, polycystic kidney disease, hyperlipidemia, sickle cell trait, scans showed 2 old CVAs, sickle cell trait, prediabetes    Limitations no driving, fall risk, visual deficits   Currently in Pain? Yes   Pain Score 10-Worst pain ever   Pain Location Head   Pain Orientation Right;Left   Pain Descriptors / Indicators Headache   Pain Type Acute pain   Pain Onset 1 to 4 weeks ago   Pain Frequency Constant   Aggravating Factors  migraine   Pain Relieving Factors unknown            Treatment: copying small peg design with RUE for increased fine motor coordination and visual perceptual skills, min v.c for design and to avoid compensation, min  Difficulty.  Arm bike x 5 mins level 1 for conditioning.  Functional reaching with  sustained pinch to place graded clothespins on vertical antennae with RUE, seated and standing only min difficulty v.c                  OT Education - 03/29/17 0918    Education provided Yes   Education Details memory compensation strategies   Person(s) Educated Patient   Methods Explanation;Demonstration;Verbal cues;Handout   Comprehension Verbalized understanding;Returned demonstration;Verbal cues required          OT Short Term Goals - 03/16/17 1728      OT SHORT TERM GOAL #1   Title Pt will be independent with initial HEP.--check STGs 04/15/17   Time 4   Period Weeks   Status New     OT SHORT TERM GOAL #2   Title Pt will improve R hand coordination/functional reaching as shown by improving score on box and blocks test by at least 10.   Baseline R-28blocks, L-52blocks   Time 4   Period Weeks   Status New     OT SHORT TERM GOAL #3   Title Pt will improve R hand coordination for ADLs as shown by improving time on 9-hole peg test by at least 10sec.   Baseline R-57.19sec   Time 4   Period Weeks   Status New     OT SHORT TERM GOAL #4   Title Pt will perform tabletop visual scanning  with at least 95% accuracy with incr time.   Time 4   Period Weeks   Status New     OT SHORT TERM GOAL #5   Title Pt will verbalize understanding of memory/visual compensation strategies.   Time 4   Period Weeks   Status New     Additional Short Term Goals   Additional Short Term Goals Yes     OT SHORT TERM GOAL #6   Title Pt will improve R grip strength by at least 8lbs to assist with ADLs.   Baseline R-18lbs   Time 4   Period Weeks   Status New     OT SHORT TERM GOAL #7   Title Pt will perform dressing/bathing without falls x 2 weeks utilizing safety strategies (supervision).   Time 4   Period Weeks   Status New           OT Long Term Goals - 03/16/17 1738      OT LONG TERM GOAL #1   Title Pt will be independent with updated HEP.--check LTGs 05/15/17   Time  8   Period Weeks   Status New     OT LONG TERM GOAL #2   Title Pt will perform simple cooking/home maintenance tasks mod I.   Time 8   Period Weeks   Status New     OT LONG TERM GOAL #3   Title Pt will improve coordination for ADLs as shown by improving time on 9-hole peg test by at least 20sec with RUE.   Baseline R-57.19sec   Time 8   Period Weeks   Status New     OT LONG TERM GOAL #4   Title Pt will perform environmental scanning with at least 90% accuracy for incr safety in the community.   Time 8   Period Weeks   Status New     OT LONG TERM GOAL #5   Title Pt will improve R grip strength by at least 15lbs to assist with ADLs.   Baseline R-18lbs   Time 8   Period Weeks   Status New     Long Term Additional Goals   Additional Long Term Goals Yes     OT LONG TERM GOAL #6   Title Pt will be able to retrieve 3lb object from overhead shelf x3 safely with RUE.   Time 8   Period Weeks   Status New               Plan - 03/29/17 0907    Clinical Impression Statement Pt is progressing towards goals. She demonstrates improving fine motor coordination and visual perceptual skills.   Rehab Potential Good   OT Frequency 3x / week   OT Duration 8 weeks   OT Treatment/Interventions Self-care/ADL training;Moist Heat;Fluidtherapy;DME and/or AE instruction;Splinting;Patient/family education;Balance training;Therapeutic exercises;Contrast Bath;Ultrasound;Therapeutic exercise;Therapeutic activities;Cognitive remediation/compensation;Passive range of motion;Functional Mobility Training;Neuromuscular education;Cryotherapy;Electrical Stimulation;Parrafin;Energy conservation;Manual Therapy;Visual/perceptual remediation/compensation   Plan continue to address coordination/ visual perceptual skills.   OT Home Exercise Plan Education provided:  03/28/17 coordination HEP, red putty HEP       Patient will benefit from skilled therapeutic intervention in order to improve the following  deficits and impairments:  Decreased coordination, Impaired sensation, Decreased range of motion, Decreased safety awareness, Decreased activity tolerance, Decreased balance, Decreased knowledge of use of DME, Impaired UE functional use, Impaired vision/preception, Impaired perceived functional ability, Decreased strength, Decreased mobility, Decreased cognition  Visit Diagnosis: Hemiplegia and hemiparesis following cerebral infarction affecting right non-dominant side (  HCC)  Muscle weakness (generalized)  Other disturbances of skin sensation  Other lack of coordination  Attention and concentration deficit  Visuospatial deficit    Problem List Patient Active Problem List   Diagnosis Date Noted  . Hemiparesis affecting right side as late effect of cerebrovascular accident (HCC) 03/23/2017  . Lacunar infarct, acute (HCC) 03/23/2017  . Visual disturbance as complication of stroke 03/07/2017  . Adjustment disorder with anxious mood   . 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 Sickle cell trait (HCC)   . Hb-SS disease with crisis (HCC)   . Hyperlipidemia   . Thyroid nodule   . Changes in vision   . Acute ischemic stroke (HCC)   . Dysphagia, post-stroke   . Benign essential HTN   . Polycystic kidney disease   . Prediabetes   . Acute intractable headache   . Leukocytosis   . Stroke (cerebrum) (HCC) 02/25/2017    RINE,KATHRYN 03/29/2017, 9:20 AM Keene BreathKathryn Rine, OTR/L [phone] Phone: [phone] 9:31 AM 03/29/17 [facility] [address] [place], [place], [address] Phone: [phone]   Fax:  [phone]  Name: Sharon Chan MRN: [id number] Date of Birth: 01/12/1976

## 2017-03-29 NOTE — Patient Instructions (Signed)

## 2017-03-29 NOTE — Therapy (Signed)
[facility] [address] [place], [place], [address] Phone: [phone]   Fax:  [phone]  Physical Therapy Treatment  Patient Details  Name: Sharon Chan MRN: [id number] Date of Birth: [birth year] Referring Provider: Claudette Laws, MD  Encounter Date: 03/29/2017      PT End of Session - 03/29/17 0819    Visit Number 3   Number of Visits 18   Date for PT Re-Evaluation 05/15/17   Authorization Type G code every 10th visit   PT Start Time 0809  apt started late   PT Stop Time 0845   PT Time Calculation (min) 36 min   Equipment Utilized During Treatment Gait belt   Activity Tolerance Patient tolerated treatment well   Behavior During Therapy [facility] for tasks assessed/performed      Past Medical History:  Diagnosis Date  . CVA (cerebral vascular accident) (HCC)   . Polycystic disease, ovaries   . Seizures (HCC)   . Sickle cell trait [facility])     Past Surgical History:  Procedure Laterality Date  . LAPAROSCOPIC CHOLECYSTECTOMY      Vitals:   03/29/17 0814  BP: 120/80        Subjective Assessment - 03/29/17 0814    Subjective No new complaints. No new falls. Usual headache. See's neurologist today at 2:00pm.    Pertinent History sickle cell, polycystic ovarian disease, obesity, thyroid nodule   Patient Stated Goals "To get back to a sense of normalcy", improve sensation in her right side and be independent.   Currently in Pain? Yes   Pain Score 10-Worst pain ever   Pain Location Head   Pain Descriptors / Indicators Headache   Pain Type Acute pain   Pain Onset 1 to 4 weeks ago   Pain Frequency Constant   Aggravating Factors  migraine, light   Pain Relieving Factors unknown            [facility] Adult PT Treatment/Exercise - 03/29/17 0827      Transfers   Transfers Sit to Stand;Stand to Sit   Sit to Stand 6: Modified independent (Device/Increase time);With upper extremity assist;From bed;From  chair/3-in-1   Stand to Sit 6: Modified independent (Device/Increase time);With upper extremity assist;To bed;To chair/3-in-1     Ambulation/Gait   Ambulation/Gait Yes   Ambulation/Gait Assistance 5: Supervision   Ambulation/Gait Assistance Details adjusted pt's cane down to improve fit for her. used ottobock walkon posterior brace today with gait with improved foot clearance and knee control. Pt reported right leg feeling more stable as well.    Ambulation Distance (Feet) 115 Feet  x 2   Assistive device Straight cane   Gait Pattern Step-through pattern;Decreased stride length;Decreased stance time - right;Decreased step length - left;Decreased dorsiflexion - right   Ambulation Surface Level;Indoor         Vestibular Treatment/Exercise - 03/29/17 0828      Austin Miles   Number of Reps  1   Symptom Description  no symptoms reported either way     X1 Viewing Horizontal   Foot Position wide in seated   Reps 10   Comments pt with base line blurry vision, cued to maintain this level with head movements, mild increase reproted     X1 Viewing Vertical   Foot Position wide in seated   Reps 10   Comments no increased reported             PT Education - 03/29/17 0920    Education  provided Yes   Education Details safety with mobility: use of RW for now for increased stability, trial of AFO on right LE/process for obtaining one, follow up with opthamologist for blurred vision as recommended by Md at rehab discharge   Person(s) Educated Patient   Methods Explanation;Demonstration   Comprehension Verbalized understanding;Returned demonstration;Verbal cues required;Need further instruction          PT Short Term Goals - 03/17/17 0955      PT SHORT TERM GOAL #1   Title The patient will return demo HEP for R LE strength, dynamic balance activities.   Baseline Target date 04/16/2017   Time 4   Period Weeks     PT SHORT TERM GOAL #2   Title The patient will improve gait speed  from 2 ft/sec to > or equal to 2.4 ft/sec to demo increasing functional mobility.   Baseline Target date 04/16/2017   Time 4   Period Weeks     PT SHORT TERM GOAL #3   Title The patient will improve FGA from 8/30 to > or equal to 12/30 to demo improving dynamic balance/gait.   Baseline Target date 04/16/2017   Time 4   Period Weeks     PT SHORT TERM GOAL #4   Title The patient will return demo stair negotiation with one handrail, reciprocal pattern, modified indep.   Baseline Target date 04/16/2017   Time 4   Period Weeks           PT Long Term Goals - 03/17/17 [id number]      PT LONG TERM GOAL #1   Title The patient will be indep with progression of HEP.   Baseline Target date 05/15/2017   Time 8   Period Weeks     PT LONG TERM GOAL #2   Title The patient will improve Gait speed from 2 ft/sec to > or equal to 2.8 ft/sec to demo return to "full community ambulator" classification of gait.   Baseline Target date 05/15/2017   Time 8   Period Weeks     PT LONG TERM GOAL #3   Title The patient will improve FGA from 8/30 up to 15/30 to demo improving dynamic balance activities.   Baseline Target date 05/15/2017   Time 8   Period Weeks     PT LONG TERM GOAL #4   Title Improve neuro QOL LE from 44.4% up to 60% to demo improving self perception of disability.   Baseline Target date 05/15/2017   Time 8   Period Weeks     PT LONG TERM GOAL #5   Title The patient will improve R LE strength to 5/5.   Baseline Target date 05/15/2017   Time 8   Period Weeks           Plan - 03/29/17 [id number]    Clinical Impression Statement Today's skilled session focused on fall prevention. Discussed use of RW at this time for increased stability due to right LE weakness and decreased sensation. Pt agreeable to using her RW vs cane at this time. No dizziness or symptoms reported with brandt daroff therefore did not issue to HEP for habituation. Attempted x1 exercises to work on addressing her dizziness however  pt has baseline blurred vision and was not able to keep the letter in focus. Of note in her discharge note from rehab it was recommended she follow up with the opthamologist that saw her on rehab. She has not called and set that apt up  to date. Remainder of session addressed right LE weakness/instability with use of brace. Pt did report feeling increaesd stability with use of AFO during gait and increased ease in adavancing/clearing foot during swing phase. Will submit AFO request to MD, primary PT to add orthotics to plan of care. Pt is making steady progress toward goals and should benefit from continued PT to progress toward unmet goals.                            Rehab Potential Good   PT Frequency 3x / week   PT Duration 4 weeks  followed by 2x/week for 4 weeks   PT Treatment/Interventions ADLs/Self Care Home Management;Therapeutic activities;Therapeutic exercise;Balance training;Neuromuscular re-education;Gait training;Stair training;Patient/family education;Functional mobility training   PT Next Visit Plan continue to work on gait and strengthening   PT Home Exercise Plan     Consulted and Agree with Plan of Care Patient      Patient will benefit from skilled therapeutic intervention in order to improve the following deficits and impairments:  Abnormal gait, Difficulty walking, Decreased activity tolerance, Decreased balance, Decreased mobility, Decreased strength, Impaired sensation  Visit Diagnosis: Hemiplegia and hemiparesis following cerebral infarction affecting right non-dominant side (HCC)  Unsteadiness on feet  Muscle weakness (generalized)  Other abnormalities of gait and mobility  Other disturbances of skin sensation     Problem List Patient Active Problem List   Diagnosis Date Noted  . Hemiparesis affecting right side as late effect of cerebrovascular accident (HCC) 03/23/2017  . Lacunar infarct, acute (HCC) 03/23/2017  . Visual disturbance as complication of stroke  03/07/2017  . Adjustment disorder with anxious mood   . 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 Sickle cell trait (HCC)   . Hb-SS disease with crisis (HCC)   . Hyperlipidemia   . Thyroid nodule   . Changes in vision   . Acute ischemic stroke (HCC)   . Dysphagia, post-stroke   . Benign essential HTN   . Polycystic kidney disease   . Prediabetes   . Acute intractable headache   . Leukocytosis   . Stroke (cerebrum) (HCC) 02/25/2017    Sallyanne Kuster, PTA, [facility] [address] [place], [place] [address] [phone] 03/29/17, 9:22 AM   Name: MAYLI COVINGTON MRN: [id number] Date of Birth: 01-28-1976

## 2017-03-29 NOTE — Patient Instructions (Addendum)
Ways to compensate for word finding DESCRIBE USE A SYNONYM REPHRASE (circumlocution)   Describing words  What group does it belong to?  What do I use it for?  Where can I find it?  What does it LOOK like?  What other words go with it?  What is the 1st sound of the word?    Many Ways to Communicate  Describe it Write it Draw it Gesture it Use related words   There's an App for that: Family Feud, Heads up, Scattergories, Stop-fun categories, What if,  Constant Therapy (free for 15 days) "PARTNER10"=10% off subscription

## 2017-03-30 ENCOUNTER — Ambulatory Visit: Payer: Medicare Other | Admitting: Occupational Therapy

## 2017-03-30 ENCOUNTER — Ambulatory Visit: Payer: Medicare Other | Admitting: Rehabilitative and Restorative Service Providers"

## 2017-03-30 DIAGNOSIS — M6281 Muscle weakness (generalized): Secondary | ICD-10-CM | POA: Diagnosis not present

## 2017-03-30 DIAGNOSIS — R41840 Attention and concentration deficit: Secondary | ICD-10-CM

## 2017-03-30 DIAGNOSIS — R278 Other lack of coordination: Secondary | ICD-10-CM

## 2017-03-30 DIAGNOSIS — I69353 Hemiplegia and hemiparesis following cerebral infarction affecting right non-dominant side: Secondary | ICD-10-CM | POA: Diagnosis not present

## 2017-03-30 DIAGNOSIS — R2681 Unsteadiness on feet: Secondary | ICD-10-CM

## 2017-03-30 DIAGNOSIS — R2689 Other abnormalities of gait and mobility: Secondary | ICD-10-CM

## 2017-03-30 DIAGNOSIS — I69319 Unspecified symptoms and signs involving cognitive functions following cerebral infarction: Secondary | ICD-10-CM | POA: Diagnosis not present

## 2017-03-30 DIAGNOSIS — R208 Other disturbances of skin sensation: Secondary | ICD-10-CM | POA: Diagnosis not present

## 2017-03-30 NOTE — Therapy (Signed)
[facility] [address] [place], [place], [address] Phone: [phone]   Fax:  [phone]  Occupational Therapy Treatment  Patient Details  Name: Sharon Chan MRN: [id number] Date of Birth: [birth year] Referring Provider: Dr. Claudette Laws  Encounter Date: 03/30/2017      OT End of Session - 03/30/17 0845    Visit Number 4   Number of Visits 21   Date for OT Re-Evaluation 05/15/17   Authorization Type Medicare/Medicaid; g-code needed   Authorization - Visit Number 4   Authorization - Number of Visits 10   OT Start Time 0804   OT Stop Time 0845   OT Time Calculation (min) 41 min      Past Medical History:  Diagnosis Date  . CVA (cerebral vascular accident) (HCC)   . Polycystic disease, ovaries   . Seizures (HCC)   . Sickle cell trait [facility])     Past Surgical History:  Procedure Laterality Date  . LAPAROSCOPIC CHOLECYSTECTOMY      There were no vitals filed for this visit.      Subjective Assessment - 03/30/17 0804    Subjective  Pt reports falling yesterday   Pertinent History L CVA 02/25/17; HTN, polycystic kidney disease, hyperlipidemia, sickle cell trait, scans showed 2 old CVAs, sickle cell trait, prediabetes    Limitations no driving, fall risk, visual deficits   Currently in Pain? Yes   Pain Score 7    Pain Location Head   Pain Orientation Right;Left   Pain Descriptors / Indicators Headache   Pain Type Acute pain   Pain Onset 1 to 4 weeks ago   Pain Frequency Constant   Aggravating Factors  migraine    Pain Relieving Factors medicine, rest                Treatment; flipping and dealing playing cards, and picking up coins to stack and then place in container with RUE, min v.c min-mod difficulty. Reviewed red putty HEP for grips and pinch, pt returned demonstration.              OT Education - 03/30/17 1217    Education provided Yes   Education Details red  theraband HEP, 10-15 reps each- see pt instructions, recommended supervision with mobility due to recent falls   Person(s) Educated Patient   Methods Demonstration   Comprehension Verbalized understanding;Returned demonstration;Verbal cues required          OT Short Term Goals - 03/16/17 1728      OT SHORT TERM GOAL #1   Title Pt will be independent with initial HEP.--check STGs 04/15/17   Time 4   Period Weeks   Status New     OT SHORT TERM GOAL #2   Title Pt will improve R hand coordination/functional reaching as shown by improving score on box and blocks test by at least 10.   Baseline R-28blocks, L-52blocks   Time 4   Period Weeks   Status New     OT SHORT TERM GOAL #3   Title Pt will improve R hand coordination for ADLs as shown by improving time on 9-hole peg test by at least 10sec.   Baseline R-57.19sec   Time 4   Period Weeks   Status New     OT SHORT TERM GOAL #4   Title Pt will perform tabletop visual scanning with at least 95% accuracy with incr time.   Time 4   Period Weeks   Status New  OT SHORT TERM GOAL #5   Title Pt will verbalize understanding of memory/visual compensation strategies.   Time 4   Period Weeks   Status New     Additional Short Term Goals   Additional Short Term Goals Yes     OT SHORT TERM GOAL #6   Title Pt will improve R grip strength by at least 8lbs to assist with ADLs.   Baseline R-18lbs   Time 4   Period Weeks   Status New     OT SHORT TERM GOAL #7   Title Pt will perform dressing/bathing without falls x 2 weeks utilizing safety strategies (supervision).   Time 4   Period Weeks   Status New           OT Long Term Goals - 03/16/17 1738      OT LONG TERM GOAL #1   Title Pt will be independent with updated HEP.--check LTGs 05/15/17   Time 8   Period Weeks   Status New     OT LONG TERM GOAL #2   Title Pt will perform simple cooking/home maintenance tasks mod I.   Time 8   Period Weeks   Status New     OT  LONG TERM GOAL #3   Title Pt will improve coordination for ADLs as shown by improving time on 9-hole peg test by at least 20sec with RUE.   Baseline R-57.19sec   Time 8   Period Weeks   Status New     OT LONG TERM GOAL #4   Title Pt will perform environmental scanning with at least 90% accuracy for incr safety in the community.   Time 8   Period Weeks   Status New     OT LONG TERM GOAL #5   Title Pt will improve R grip strength by at least 15lbs to assist with ADLs.   Baseline R-18lbs   Time 8   Period Weeks   Status New     Long Term Additional Goals   Additional Long Term Goals Yes     OT LONG TERM GOAL #6   Title Pt will be able to retrieve 3lb object from overhead shelf x3 safely with RUE.   Time 8   Period Weeks   Status New               Plan - 03/30/17 0840    Clinical Impression Statement Pt is progressing towards goals. She demonstrates understanding of the theraband HEP, yet can benefit from review.   Rehab Potential Good   OT Frequency 3x / week   OT Duration 8 weeks   OT Treatment/Interventions Self-care/ADL training;Moist Heat;Fluidtherapy;DME and/or AE instruction;Splinting;Patient/family education;Balance training;Therapeutic exercises;Contrast Bath;Ultrasound;Therapeutic exercise;Therapeutic activities;Cognitive remediation/compensation;Passive range of motion;Functional Mobility Training;Neuromuscular education;Cryotherapy;Electrical Stimulation;Parrafin;Energy conservation;Manual Therapy;Visual/perceptual remediation/compensation   OT Home Exercise Plan Education provided:  03/28/17 coordination HEP, red putty HEP , red theraband issued 03/30/17   Consulted and Agree with Plan of Care Patient      Patient will benefit from skilled therapeutic intervention in order to improve the following deficits and impairments:  Decreased coordination, Impaired sensation, Decreased range of motion, Decreased safety awareness, Decreased activity tolerance, Decreased  balance, Decreased knowledge of use of DME, Impaired UE functional use, Impaired vision/preception, Impaired perceived functional ability, Decreased strength, Decreased mobility, Decreased cognition  Visit Diagnosis: Muscle weakness (generalized)  Other disturbances of skin sensation  Other lack of coordination  Attention and concentration deficit    Problem List Patient Active Problem List  Diagnosis Date Noted  . Hemiparesis affecting right side as late effect of cerebrovascular accident (HCC) 03/23/2017  . Lacunar infarct, acute (HCC) 03/23/2017  . Visual disturbance as complication of stroke 03/07/2017  . Adjustment disorder with anxious mood   . 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 Sickle cell trait (HCC)   . Hb-SS disease with crisis (HCC)   . Hyperlipidemia   . Thyroid nodule   . Changes in vision   . Acute ischemic stroke (HCC)   . Dysphagia, post-stroke   . Benign essential HTN   . Polycystic kidney disease   . Prediabetes   . Acute intractable headache   . Leukocytosis   . Stroke (cerebrum) (HCC) 02/25/2017    Sharon Chan 03/30/2017, 12:20 PM  [facility] [address] [place], [place], [address] Phone: [phone]   Fax:  [phone]  Name: Sharon Chan MRN: [id number] Date of Birth: 09/25/1976

## 2017-03-30 NOTE — Patient Instructions (Signed)
Hamstring Stretch, Seated (Strap, Two Chairs)    Sit with one leg extended onto facing chair. Loop strap over outstretched foot at ball of big toe. Lengthen spine. Hold for __20__ breaths. Repeat __2__ times each leg.  Copyright  VHI. All rights reserved.

## 2017-03-30 NOTE — Patient Instructions (Signed)
  Strengthening: Resisted Flexion   Perform seated in a chair, Hold tubing with _right____ arm(s) at side. Pull forward and up. Move shoulder through pain-free range of motion. Repeat __10__ times per set.  Do _1-2_ sessions per day , every other day   Strengthening: Resisted Extension   Perform seated in a chair, Hold tubing in __right___ hand(s), arm forward. Pull arm back, elbow straight. Repeat _10___ times per set. Do _1-2___ sessions per day, every other day.   Resisted Horizontal Abduction: Bilateral   Sit , tubing in both hands, arms out in front. Keeping arms straight, pinch shoulder blades together and stretch arms out. Repeat _15__ times per set. Do _1-2___ sessions per day, every other day.   Elbow Flexion: Resisted   With tubing held in __right____ hand(s) and other end secured under foot, curl arm up as far as possible. Repeat _15__ times per set. Do _1-2___ sessions per day, every other day.    Elbow Extension: Resisted   Sit in chair with resistive band secured at armrest (or hold with other hand) and _right______ elbow bent. Straighten elbow. Repeat _15___ times per set.  Do _1-2___ sessions per day, every other day.   Copyright  VHI. All rights reserved.

## 2017-03-30 NOTE — Therapy (Signed)
[facility] [address] [place], [place], [address] Phone: [phone]   Fax:  [phone]  Physical Therapy Treatment  Patient Details  Name: Sharon Chan MRN: [id number] Date of Birth: 1976/03/24 Referring Provider: Claudette Laws, MD  Encounter Date: 03/30/2017      PT End of Session - 03/30/17 0907    Visit Number 4   Number of Visits 18   Date for PT Re-Evaluation 05/15/17   Authorization Type G code every 10th visit   PT Start Time 0846   PT Stop Time 0930   PT Time Calculation (min) 44 min   Equipment Utilized During Treatment Gait belt   Activity Tolerance Patient tolerated treatment well   Behavior During Therapy [facility] for tasks assessed/performed      Past Medical History:  Diagnosis Date  . CVA (cerebral vascular accident) (HCC)   . Polycystic disease, ovaries   . Seizures (HCC)   . Sickle cell trait [facility])     Past Surgical History:  Procedure Laterality Date  . LAPAROSCOPIC CHOLECYSTECTOMY      There were no vitals filed for this visit.      Subjective Assessment - 03/30/17 0849    Subjective The patient fell last night- slid off of bed.  She notes she saw nurse practicioner at neurology clinic yesterday.  She reports she is planning on changing neurologists.  She does not take her meds before PT because they make her dizzy.     Pertinent History sickle cell, polycystic ovarian disease, obesity, thyroid nodule   Patient Stated Goals "To get back to a sense of normalcy", improve sensation in her right side and be independent.   Currently in Pain? Yes   Pain Score 7    Pain Location Head   Pain Orientation Right;Left   Pain Descriptors / Indicators Headache   Pain Type Acute pain   Pain Onset 1 to 4 weeks ago   Pain Frequency Constant   Aggravating Factors  migraine   Pain Relieving Factors medicine, caffeine, rest                         [facility] Adult PT  Treatment/Exercise - 03/30/17 0910      Ambulation/Gait   Ambulation/Gait Yes   Ambulation/Gait Assistance 4: Min guard;5: Supervision   Ambulation/Gait Assistance Details Treadmill x 3 minutes at 1.0 mph with bilateral UE support.   Ambulation Distance (Feet) 300 Feet  115 ft x 3 reps   Assistive device Straight cane   Gait Pattern Step-through pattern;Decreased stride length;Decreased stance time - right;Decreased step length - left;Decreased dorsiflexion - right   Ambulation Surface Level;Indoor   Gait Comments PT provided tactile and verbal cues for right heel strike and cues to lengthen stride to extend through R knee.  Discussed benefits of AFO and fall frequency.       Self-Care   Self-Care Other Self-Care Comments   Other Self-Care Comments  Discussed patterns with falls.  No time of day, activity, etc is leading to falls.  Recommend slower, deliberate movements and will further assess need for AFO.      Exercises   Exercises Other Exercises   Other Exercises  Seated hamstring stretching added to HEP x 30 seconds x 2 reps each side.          Vestibular Treatment/Exercise - 03/29/17 4132      Austin Miles   Number of Reps  1  Symptom Description  no symptoms reported either way     X1 Viewing Horizontal   Foot Position wide in seated   Reps 10   Comments pt with base line blurry vision, cued to maintain this level with head movements, mild increase reproted     X1 Viewing Vertical   Foot Position wide in seated   Reps 10   Comments no increased reported               PT Education - 03/30/17 0924    Education provided Yes   Education Details hamstring stretch added   Person(s) Educated Patient   Methods Explanation;Demonstration;Handout   Comprehension Verbalized understanding;Returned demonstration          PT Short Term Goals - 03/17/17 0955      PT SHORT TERM GOAL #1   Title The patient will return demo HEP for R LE strength, dynamic balance  activities.   Baseline Target date 04/16/2017   Time 4   Period Weeks     PT SHORT TERM GOAL #2   Title The patient will improve gait speed from 2 ft/sec to > or equal to 2.4 ft/sec to demo increasing functional mobility.   Baseline Target date 04/16/2017   Time 4   Period Weeks     PT SHORT TERM GOAL #3   Title The patient will improve FGA from 8/30 to > or equal to 12/30 to demo improving dynamic balance/gait.   Baseline Target date 04/16/2017   Time 4   Period Weeks     PT SHORT TERM GOAL #4   Title The patient will return demo stair negotiation with one handrail, reciprocal pattern, modified indep.   Baseline Target date 04/16/2017   Time 4   Period Weeks           PT Long Term Goals - 03/17/17 [id number]      PT LONG TERM GOAL #1   Title The patient will be indep with progression of HEP.   Baseline Target date 05/15/2017   Time 8   Period Weeks     PT LONG TERM GOAL #2   Title The patient will improve Gait speed from 2 ft/sec to > or equal to 2.8 ft/sec to demo return to "full community ambulator" classification of gait.   Baseline Target date 05/15/2017   Time 8   Period Weeks     PT LONG TERM GOAL #3   Title The patient will improve FGA from 8/30 up to 15/30 to demo improving dynamic balance activities.   Baseline Target date 05/15/2017   Time 8   Period Weeks     PT LONG TERM GOAL #4   Title Improve neuro QOL LE from 44.4% up to 60% to demo improving self perception of disability.   Baseline Target date 05/15/2017   Time 8   Period Weeks     PT LONG TERM GOAL #5   Title The patient will improve R LE strength to 5/5.   Baseline Target date 05/15/2017   Time 8   Period Weeks               Plan - 03/30/17 1205    Clinical Impression Statement The patient has good motor control of the R LE, however she is continuing to have falls due to sensory deficits, decreased awareness/attention.  PT and patient discussed slowing movement and being deliberate about R LE  placement.  She clears right foot well in therapy, however  in her home environment she reports catching it on thresholds and occasional knee instability.  She can correct with cues, but we may have to consider AFO if falls continue.  PT discussed safety with patient.   PT Treatment/Interventions ADLs/Self Care Home Management;Therapeutic activities;Therapeutic exercise;Balance training;Neuromuscular re-education;Gait training;Stair training;Patient/family education;Functional mobility training;Orthotic Fit/Training   PT Next Visit Plan Continue to assess need for AFO--if falls continue, recommend due to sensory deficits and inconsistent clearing; LE strengthening, repetitions of gait, work towards STGs/LTGs/   Consulted and Agree with Plan of Care Patient      Patient will benefit from skilled therapeutic intervention in order to improve the following deficits and impairments:  Abnormal gait, Difficulty walking, Decreased activity tolerance, Decreased balance, Decreased mobility, Decreased strength, Impaired sensation  Visit Diagnosis: Muscle weakness (generalized)  Unsteadiness on feet  Other abnormalities of gait and mobility     Problem List Patient Active Problem List   Diagnosis Date Noted  . Hemiparesis affecting right side as late effect of cerebrovascular accident (HCC) 03/23/2017  . Lacunar infarct, acute (HCC) 03/23/2017  . Visual disturbance as complication of stroke 03/07/2017  . Adjustment disorder with anxious mood   . 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 Sickle cell trait (HCC)   . Hb-SS disease with crisis (HCC)   . Hyperlipidemia   . Thyroid nodule   . Changes in vision   . Acute ischemic stroke (HCC)   . Dysphagia, post-stroke   . Benign essential HTN   . Polycystic kidney disease   . Prediabetes   . Acute intractable headache   . Leukocytosis   . Stroke (cerebrum) (HCC) 02/25/2017    Marilyne Haseley, PT 03/30/2017,  12:14 PM   [facility] [address] [place], [place], [address] Phone: [phone]   Fax:  [phone]  Name: Sharon Chan MRN: [id number] Date of Birth: [birth year]

## 2017-03-30 NOTE — Therapy (Signed)
[facility] [address] [place], [place], [address] Phone: [phone]   Fax:  [phone]  Speech Language Pathology Evaluation  Patient Details  Name: Sharon Shaggyiffany R Chan MRN: [id number] Date of Birth: 02/05/1976 No Data Recorded  Encounter Date: 03/29/2017      End of Session - 03/30/17 1013    Visit Number 1   Number of Visits 17   Date for SLP Re-Evaluation 06/02/17   SLP Start Time 1019   SLP Stop Time  1100   SLP Time Calculation (min) 41 min   Activity Tolerance Patient tolerated treatment well      Past Medical History:  Diagnosis Date  . CVA (cerebral vascular accident) (HCC)   . Polycystic disease, ovaries   . Seizures (HCC)   . Sickle cell trait [facility](HCC)     Past Surgical History:  Procedure Laterality Date  . LAPAROSCOPIC CHOLECYSTECTOMY      There were no vitals filed for this visit.      Subjective Assessment - 03/29/17 1027    Subjective "It's my short term memory that's the problem."    Currently in Pain? Yes   Pain Score 10-Worst pain ever   Pain Location Head   Pain Orientation Right;Left   Pain Descriptors / Indicators Headache   Pain Type Acute pain   Pain Onset 1 to 4 weeks ago   Pain Frequency Constant   Aggravating Factors  migraine   Pain Relieving Factors migraine            SLP Evaluation [facility] - 03/30/17 1007      Pain Assessment   Currently in Pain? Yes   Pain Score 10-Worst pain ever   Pain Location Head   Pain Orientation Right;Left   Pain Type Acute pain   Pain Onset 1 to 4 weeks ago   Pain Relieving Factors meds     Prior Functional Status   Cognitive/Linguistic Baseline Within functional limits   Type of Home Apartment    Lives With Alone   Education high school   Vocation On disability     Cognition   Overall Cognitive Status Within Functional Limits for tasks assessed     Auditory Comprehension   Overall Auditory Comprehension Appears within  functional limits for tasks assessed     Verbal Expression   Overall Verbal Expression Impaired   Level of Generative/Spontaneous Verbalization Conversation   Other Naming Comments Anomia noted intermittently in mod complex/complex conversation. Pt reports this is bothersome to her.     Oral Motor/Sensory Function   Overall Oral Motor/Sensory Function Appears within functional limits for tasks assessed     Motor Speech   Overall Motor Speech Appears within functional limits for tasks assessed                         SLP Education - 03/30/17 1011    Education provided Yes   Education Details apps for word finding, compensations for word finding   Person(s) Educated Patient   Methods Explanation;Verbal cues;Handout;Demonstration   Comprehension Verbalized understanding;Verbal cues required;Need further instruction          SLP Short Term Goals - 03/30/17 1016      SLP SHORT TERM GOAL #1   Title Pt will tell four ways to compensate for anomia   Time 4   Period Weeks   Status New     SLP SHORT TERM GOAL #2   Title pt will  complete mod complex naming tasks independently with 90% success over three sessions   Time 4   Period Weeks   Status New     SLP SHORT TERM GOAL #3   Title pt will engage functionally in 10 minutes mod complex/complex conversation using compensatory strategies PRN over three sessions   Time 4   Period Weeks   Status New          SLP Long Term Goals - 03/30/17 1017      SLP LONG TERM GOAL #1   Title pt will engage functionally in 25 minutes mostly complex conversation outside ST room, using compensatory strategies PRN over three sessions   Time 8   Period Weeks   Status New          Plan - 03/30/17 1013    Clinical Impression Statement Pt presents today with word finding (mild) in mod complex/copmlex conversation of extended length, and scores below the normal range on standardized testing for anomia [employer] Test  -2). Skilled ST is recommended for improving her verbal expression as close to PLOF as possible.    Speech Therapy Frequency 2x / week   Duration --  8 weeks   Treatment/Interventions Language facilitation;Compensatory techniques;Internal/external aids;SLP instruction and feedback;Multimodal communcation approach;Patient/family education;Functional tasks   Potential to Achieve Goals Good   Consulted and Agree with Plan of Care Patient      Patient will benefit from skilled therapeutic intervention in order to improve the following deficits and impairments:   Aphasia - Plan: SLP plan of care cert/re-cert    Problem List Patient Active Problem List   Diagnosis Date Noted  . Hemiparesis affecting right side as late effect of cerebrovascular accident (HCC) 03/23/2017  . Lacunar infarct, acute (HCC) 03/23/2017  . Visual disturbance as complication of stroke 03/07/2017  . Adjustment disorder with anxious mood   . 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 Sickle cell trait (HCC)   . Hb-SS disease with crisis (HCC)   . Hyperlipidemia   . Thyroid nodule   . Changes in vision   . Acute ischemic stroke (HCC)   . Dysphagia, post-stroke   . Benign essential HTN   . Polycystic kidney disease   . Prediabetes   . Acute intractable headache   . Leukocytosis   . Stroke (cerebrum) (HCC) 02/25/2017    SCHINKE,CARL ,MS, [name]  03/30/2017, 10:20 AM   [facility] [address] [place], [place], [address] Phone: [phone]   Fax:  [phone]  Name: Sharon Chan MRN: [id number] Date of Birth: [birth year]

## 2017-03-31 ENCOUNTER — Ambulatory Visit (HOSPITAL_COMMUNITY)
Admission: RE | Admit: 2017-03-31 | Discharge: 2017-03-31 | Disposition: A | Discharge status: Home / Self Care | Payer: Medicare Other | Source: Ambulatory Visit | Attending: Family Medicine | Admitting: Family Medicine

## 2017-03-31 DIAGNOSIS — E041 Nontoxic single thyroid nodule: Secondary | ICD-10-CM | POA: Insufficient documentation

## 2017-04-03 ENCOUNTER — Ambulatory Visit: Payer: Medicare Other | Admitting: Rehabilitative and Restorative Service Providers"

## 2017-04-03 ENCOUNTER — Ambulatory Visit: Payer: Medicare Other | Admitting: Occupational Therapy

## 2017-04-03 DIAGNOSIS — R2689 Other abnormalities of gait and mobility: Secondary | ICD-10-CM

## 2017-04-03 DIAGNOSIS — R41840 Attention and concentration deficit: Secondary | ICD-10-CM

## 2017-04-03 DIAGNOSIS — R2681 Unsteadiness on feet: Secondary | ICD-10-CM

## 2017-04-03 DIAGNOSIS — R278 Other lack of coordination: Secondary | ICD-10-CM

## 2017-04-03 DIAGNOSIS — R208 Other disturbances of skin sensation: Secondary | ICD-10-CM | POA: Diagnosis not present

## 2017-04-03 DIAGNOSIS — M6281 Muscle weakness (generalized): Secondary | ICD-10-CM | POA: Diagnosis not present

## 2017-04-03 DIAGNOSIS — I69353 Hemiplegia and hemiparesis following cerebral infarction affecting right non-dominant side: Secondary | ICD-10-CM

## 2017-04-03 DIAGNOSIS — R41842 Visuospatial deficit: Secondary | ICD-10-CM

## 2017-04-03 DIAGNOSIS — I69319 Unspecified symptoms and signs involving cognitive functions following cerebral infarction: Secondary | ICD-10-CM

## 2017-04-03 NOTE — Patient Instructions (Addendum)
Compensation strategies: 1. Look for the edge of objects (to the left and/or right) so that you make sure you are seeing all of an object 2. Turn your head when walking, scan from side to side, particularly in busy environments and have someone with you for safety. 3. Use an organized scanning pattern. It's usually easier to scan from top to bottom, and left to right (like you are reading) 4. Double check yourself 5. Use a line guide (like a blank piece of paper) or your finger when reading 6. If necessary, place brightly colored tape at end of table or work area as a reminder to always look until you see the tape.  7. Large print will be easier to see. 8. Organize items in baskets and/or with labels so they are easier to see/find 9. Remove clutter 10. Make sure you have good lighting. 11. Consider using contrast/brightly colored strips on steps to make it easier to see the edge.   Visual Activities: 1. Simple word search 2. Read simple paragraphs/bible verses out loud 3. With someone with you, look for items in grocery store 4. Work simple jigsaw puzzles 5. Play simple matching games online/tablet that make you search visually. 6.  Play board/card games (memory/matching, solitaire, connect 4, etc.)                   Memory Compensation Strategies  1. Use "WARM" strategy. W= write it down A=  associate it R=  repeat it M=  make a mental picture  2. You can keep a Glass blower/designerMemory Notebook. Use a 3-ring notebook with sections for the following:  calendar, important names and phone numbers, medications, doctors' names/phone numbers, "to do list"/reminders, and a section to journal what you did each day  3. Use a calendar to write appointments down.  4. Write yourself a schedule for the day.  This can be placed on the calendar or in a separate section of the Memory Notebook.  Keeping a regular schedule can help memory.  5. Use medication organizer with sections for  each day or morning/evening pills  You may need help loading it  6. Keep a basket, or pegboard by the door.   Place items that you need to take out with you in the basket or on the pegboard.  You may also want to include a message board for reminders.  7. Use sticky notes. Place sticky notes with reminders in a place where the task is performed.  For example:  "turn off the stove" placed by the stove, "lock the door" placed on the door at eye level, "take your medications" on the bathroom mirror or by the place where you normally take your medications  8. Use alarms/timers.  Use while cooking to remind yourself to check on food or as a reminder to take your medicine, or as a reminder to make a call, or as a reminder to perform another task, etc.  9. Use a small tape recorder to record important information and notes for yourself.

## 2017-04-03 NOTE — Therapy (Signed)
[facility] [address] [place], [place], [address] Phone: [phone]   Fax:  [phone]  Physical Therapy Treatment  Patient Details  Name: Sharon Chan MRN: [id number] Date of Birth: 04/06/1976 Referring Provider: Claudette Laws, MD  Encounter Date: 04/03/2017      PT End of Session - 04/03/17 1243    Visit Number 5   Number of Visits 18   Date for PT Re-Evaluation 05/15/17   Authorization Type G code every 10th visit   PT Start Time 1235   PT Stop Time 1315   PT Time Calculation (min) 40 min   Equipment Utilized During Treatment Gait belt   Activity Tolerance Patient tolerated treatment well   Behavior During Therapy [facility] for tasks assessed/performed      Past Medical History:  Diagnosis Date  . CVA (cerebral vascular accident) (HCC)   . Polycystic disease, ovaries   . Seizures (HCC)   . Sickle cell trait [facility])     Past Surgical History:  Procedure Laterality Date  . LAPAROSCOPIC CHOLECYSTECTOMY      There were no vitals filed for this visit.      Subjective Assessment - 04/03/17 1238    Subjective The patient did not have any falls at home since last session, however she did have 2 falls in community (1 at church and 1 at the grocery store).  The patient notes the church was very busy and she isn't sure if it was anxiety or the crowd. (she stood up from sitting and was holding a chair and "went down", at grocery store, she thought she had the cart and fell).  No injury from falls.   The patient reports she has no appetite and notes she is only eating 260-300 calories/per day.   Pertinent History sickle cell, polycystic ovarian disease, obesity, thyroid nodule   Patient Stated Goals "To get back to a sense of normalcy", improve sensation in her right side and be independent.   Currently in Pain? Yes   Pain Score 7    Pain Location Head   Pain Orientation Right;Left   Pain Descriptors /  Indicators Headache   Pain Type Acute pain   Pain Onset 1 to 4 weeks ago   Pain Frequency Constant   Aggravating Factors  headache continues   Pain Relieving Factors meds                          [facility] Adult PT Treatment/Exercise - 04/03/17 1931      Ambulation/Gait   Ambulation/Gait Yes   Ambulation/Gait Assistance 4: Min guard   Ambulation Distance (Feet) 200 Feet   Assistive device Straight cane   Ambulation Surface Level;Indoor   Gait Comments Gait on level surfaces with SPC emphasizing longer stride length, R knee extension, and R heel strike emphasis.  Obstacle course negotiation including stepping around cones, over 4 and 6" obstacles while finding targets working on increased speed.  Added cognitive task of counting backwards by 5's and patient had increased difficulty and slowed pace.      Self-Care   Self-Care Other Self-Care Comments   Other Self-Care Comments  Discussed using RW when in busy environments and home safety.      Neuro Re-ed    Neuro Re-ed Details  Seated coordination exercises performing ankle DF/PF alternating R and L sides, marching wide/narrow alternating heel/toe up.  Sit<>stand x 10 reps with L LE supported on compliant surface  with CGA.  Alternating foot taps to 6" steps and then 6-12" alternating quickly betweeen various height surfaces.                   PT Short Term Goals - 03/17/17 0955      PT SHORT TERM GOAL #1   Title The patient will return demo HEP for R LE strength, dynamic balance activities.   Baseline Target date 04/16/2017   Time 4   Period Weeks     PT SHORT TERM GOAL #2   Title The patient will improve gait speed from 2 ft/sec to > or equal to 2.4 ft/sec to demo increasing functional mobility.   Baseline Target date 04/16/2017   Time 4   Period Weeks     PT SHORT TERM GOAL #3   Title The patient will improve FGA from 8/30 to > or equal to 12/30 to demo improving dynamic balance/gait.   Baseline Target  date 04/16/2017   Time 4   Period Weeks     PT SHORT TERM GOAL #4   Title The patient will return demo stair negotiation with one handrail, reciprocal pattern, modified indep.   Baseline Target date 04/16/2017   Time 4   Period Weeks           PT Long Term Goals - 03/17/17 [id number]      PT LONG TERM GOAL #1   Title The patient will be indep with progression of HEP.   Baseline Target date 05/15/2017   Time 8   Period Weeks     PT LONG TERM GOAL #2   Title The patient will improve Gait speed from 2 ft/sec to > or equal to 2.8 ft/sec to demo return to "full community ambulator" classification of gait.   Baseline Target date 05/15/2017   Time 8   Period Weeks     PT LONG TERM GOAL #3   Title The patient will improve FGA from 8/30 up to 15/30 to demo improving dynamic balance activities.   Baseline Target date 05/15/2017   Time 8   Period Weeks     PT LONG TERM GOAL #4   Title Improve neuro QOL LE from 44.4% up to 60% to demo improving self perception of disability.   Baseline Target date 05/15/2017   Time 8   Period Weeks     PT LONG TERM GOAL #5   Title The patient will improve R LE strength to 5/5.   Baseline Target date 05/15/2017   Time 8   Period Weeks               Plan - 04/03/17 1917    Clinical Impression Statement The patient has decreased ability to multi-task during balance/gait tasks and loses track of cognitive questions or task.   The patient continues to report falls and from observation in therapy, this may be due to inattention + sensory deficits + dec'd multi-tasking.  PT is continuing to work on deficits to promote improved independence with dec'd falls.   PT Treatment/Interventions ADLs/Self Care Home Management;Therapeutic activities;Therapeutic exercise;Balance training;Neuromuscular re-education;Gait training;Stair training;Patient/family education;Functional mobility training;Orthotic Fit/Training   PT Next Visit Plan Begin checking STGs; multi-tasking  during gait/obstacle negotiation; gait with cognitive tasks; balance; R LE coordination   Consulted and Agree with Plan of Care Patient      Patient will benefit from skilled therapeutic intervention in order to improve the following deficits and impairments:  Abnormal gait, Difficulty walking, Decreased activity tolerance,  Decreased balance, Decreased mobility, Decreased strength, Impaired sensation  Visit Diagnosis: Other abnormalities of gait and mobility  Muscle weakness (generalized)  Unsteadiness on feet     Problem List Patient Active Problem List   Diagnosis Date Noted  . Hemiparesis affecting right side as late effect of cerebrovascular accident (HCC) 03/23/2017  . Lacunar infarct, acute (HCC) 03/23/2017  . Visual disturbance as complication of stroke 03/07/2017  . Adjustment disorder with anxious mood   . 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 Sickle cell trait (HCC)   . Hb-SS disease with crisis (HCC)   . Hyperlipidemia   . Thyroid nodule   . Changes in vision   . Acute ischemic stroke (HCC)   . Dysphagia, post-stroke   . Benign essential HTN   . Polycystic kidney disease   . Prediabetes   . Acute intractable headache   . Leukocytosis   . Stroke (cerebrum) (HCC) 02/25/2017    Elyna Pangilinan, PT 04/03/2017, 7:37 PM  [facility] [address] [place], [place], [address] Phone: [phone]   Fax:  [phone]  Name: LILLYAUNA JENKINSON MRN: [id number] Date of Birth: [birth year]

## 2017-04-03 NOTE — Therapy (Signed)
[facility] [address] [place], [place], [address] Phone: [phone]   Fax:  [phone]  Occupational Therapy Treatment  Patient Details  Name: Sharon Chan MRN: [id number] Date of Birth: 1976/01/12 Referring Provider: Dr. Claudette Laws  Encounter Date: 04/03/2017      OT End of Session - 04/03/17 1154    Visit Number 5   Number of Visits 21   Date for OT Re-Evaluation 05/15/17   Authorization Type Medicare/Medicaid; g-code needed   Authorization - Visit Number 5   Authorization - Number of Visits 10   OT Start Time 1148   OT Stop Time 1230   OT Time Calculation (min) 42 min   Activity Tolerance Patient tolerated treatment well   Behavior During Therapy [facility] for tasks assessed/performed      Past Medical History:  Diagnosis Date  . CVA (cerebral vascular accident) (HCC)   . Polycystic disease, ovaries   . Seizures (HCC)   . Sickle cell trait [facility])     Past Surgical History:  Procedure Laterality Date  . LAPAROSCOPIC CHOLECYSTECTOMY      There were no vitals filed for this visit.      Subjective Assessment - 04/03/17 1151    Subjective  Pt reports 2 falls since last session (1 in grocery store, 1 in church).  Pt is helping with cooking (but mom assisting), folding laundry, washing dishes.   Pertinent History L CVA 02/25/17; HTN, polycystic kidney disease, hyperlipidemia, sickle cell trait, scans showed 2 old CVAs, sickle cell trait, prediabetes    Limitations no driving, fall risk, visual deficits   Currently in Pain? Yes   Pain Score 7    Pain Location Head   Pain Orientation Right;Left   Pain Descriptors / Indicators Headache   Pain Type Acute pain   Pain Onset 1 to 4 weeks ago   Pain Frequency Constant   Aggravating Factors  migraine   Pain Relieving Factors meds        Recommended pt use RW in community, particularly in busy environments like church due to fall risk, decr wt.  Shift to R side, and incr visual demand.  Recommended against use of knives due to sensation and coordination deficits.  Recommended choppers/food processor, anti-cut gloves BUEs.  Pt verbalized understanding   Placing small pegs in pegboard to copy design with min difficulty with coordination and min v.c. With Forensic scientist.  Environmental scanning with ambulation in min distracting environment.  Pt found 10/15 items initially, additional 2 found on 2nd attempt, remaining found with min v.c. On 3rd attempt with min v.c.  Noted decr scanning initially to the R and noted decr wt. Bearing to the R and pt putting a lot of wt. Through LUE on cane with scanning.                         OT Education - 04/03/17 1258    Education Details Memory and Visual Compensation Strategies and Simple Cognitive/Visual HEP   Person(s) Educated Patient   Methods Explanation;Handout   Comprehension Verbalized understanding          OT Short Term Goals - 03/16/17 1728      OT SHORT TERM GOAL #1   Title Pt will be independent with initial HEP.--check STGs 04/15/17   Time 4   Period Weeks   Status New     OT SHORT TERM GOAL #2   Title Pt will improve  R hand coordination/functional reaching as shown by improving score on box and blocks test by at least 10.   Baseline R-28blocks, L-52blocks   Time 4   Period Weeks   Status New     OT SHORT TERM GOAL #3   Title Pt will improve R hand coordination for ADLs as shown by improving time on 9-hole peg test by at least 10sec.   Baseline R-57.19sec   Time 4   Period Weeks   Status New     OT SHORT TERM GOAL #4   Title Pt will perform tabletop visual scanning with at least 95% accuracy with incr time.   Time 4   Period Weeks   Status New     OT SHORT TERM GOAL #5   Title Pt will verbalize understanding of memory/visual compensation strategies.   Time 4   Period Weeks   Status New     Additional Short Term Goals   Additional Short Term  Goals Yes     OT SHORT TERM GOAL #6   Title Pt will improve R grip strength by at least 8lbs to assist with ADLs.   Baseline R-18lbs   Time 4   Period Weeks   Status New     OT SHORT TERM GOAL #7   Title Pt will perform dressing/bathing without falls x 2 weeks utilizing safety strategies (supervision).   Time 4   Period Weeks   Status New           OT Long Term Goals - 03/16/17 1738      OT LONG TERM GOAL #1   Title Pt will be independent with updated HEP.--check LTGs 05/15/17   Time 8   Period Weeks   Status New     OT LONG TERM GOAL #2   Title Pt will perform simple cooking/home maintenance tasks mod I.   Time 8   Period Weeks   Status New     OT LONG TERM GOAL #3   Title Pt will improve coordination for ADLs as shown by improving time on 9-hole peg test by at least 20sec with RUE.   Baseline R-57.19sec   Time 8   Period Weeks   Status New     OT LONG TERM GOAL #4   Title Pt will perform environmental scanning with at least 90% accuracy for incr safety in the community.   Time 8   Period Weeks   Status New     OT LONG TERM GOAL #5   Title Pt will improve R grip strength by at least 15lbs to assist with ADLs.   Baseline R-18lbs   Time 8   Period Weeks   Status New     Long Term Additional Goals   Additional Long Term Goals Yes     OT LONG TERM GOAL #6   Title Pt will be able to retrieve 3lb object from overhead shelf x3 safely with RUE.   Time 8   Period Weeks   Status New               Plan - 04/03/17 1156    Clinical Impression Statement Pt is progressing towards goals with improving coordination and IADL performance.  However, pt continues to have falls (decr) due to decr sensation.     Rehab Potential Good   OT Frequency 3x / week   OT Duration 8 weeks   OT Treatment/Interventions Self-care/ADL training;Moist Heat;Fluidtherapy;DME and/or AE instruction;Splinting;Patient/family education;Balance training;Therapeutic exercises;Contrast  Bath;Ultrasound;Therapeutic  exercise;Therapeutic activities;Cognitive remediation/compensation;Passive range of motion;Functional Mobility Training;Neuromuscular education;Cryotherapy;Electrical Stimulation;Parrafin;Energy conservation;Manual Therapy;Visual/perceptual remediation/compensation   Plan environmental scanning, simple IADL task looking at safety   OT Home Exercise Plan Education provided:  03/28/17 coordination HEP, red putty HEP , red theraband issued 03/30/17   Consulted and Agree with Plan of Care Patient      Patient will benefit from skilled therapeutic intervention in order to improve the following deficits and impairments:  Decreased coordination, Impaired sensation, Decreased range of motion, Decreased safety awareness, Decreased activity tolerance, Decreased balance, Decreased knowledge of use of DME, Impaired UE functional use, Impaired vision/preception, Impaired perceived functional ability, Decreased strength, Decreased mobility, Decreased cognition  Visit Diagnosis: Muscle weakness (generalized)  Other disturbances of skin sensation  Other lack of coordination  Attention and concentration deficit  Unsteadiness on feet  Other abnormalities of gait and mobility  Hemiplegia and hemiparesis following cerebral infarction affecting right non-dominant side (HCC)  Visuospatial deficit  Unspecified symptoms and signs involving cognitive functions following cerebral infarction    Problem List Patient Active Problem List   Diagnosis Date Noted  . Hemiparesis affecting right side as late effect of cerebrovascular accident (HCC) 03/23/2017  . Lacunar infarct, acute (HCC) 03/23/2017  . Visual disturbance as complication of stroke 03/07/2017  . Adjustment disorder with anxious mood   . 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 Sickle cell trait (HCC)   . Hb-SS disease with crisis (HCC)   . Hyperlipidemia   . Thyroid nodule   .  Changes in vision   . Acute ischemic stroke (HCC)   . Dysphagia, post-stroke   . Benign essential HTN   . Polycystic kidney disease   . Prediabetes   . Acute intractable headache   . Leukocytosis   . Stroke (cerebrum) (HCC) 02/25/2017    Highlands HospitalFREEMAN,Sharon Newby 04/03/2017, 1:03 PM  [facility] [address] [place], [place], [address] Phone: [phone]   Fax:  [phone]  Name: Sharon Chan MRN: [id number] Date of Birth: 07/09/1976   Willa FraterAngela Bolden Hagerman, OTR/L [address] [facility] [address] [place], [place]  [address] [phone] phone [phone] 04/03/17 1:03 PM

## 2017-04-04 ENCOUNTER — Other Ambulatory Visit: Payer: Self-pay | Admitting: Family Medicine

## 2017-04-04 DIAGNOSIS — E041 Nontoxic single thyroid nodule: Secondary | ICD-10-CM

## 2017-04-05 ENCOUNTER — Ambulatory Visit: Payer: Medicare Other | Admitting: Occupational Therapy

## 2017-04-05 ENCOUNTER — Ambulatory Visit: Payer: Medicare Other | Admitting: Physical Therapy

## 2017-04-05 ENCOUNTER — Encounter: Payer: Self-pay | Admitting: Physical Therapy

## 2017-04-05 ENCOUNTER — Encounter: Payer: Self-pay | Admitting: Occupational Therapy

## 2017-04-05 DIAGNOSIS — R41840 Attention and concentration deficit: Secondary | ICD-10-CM | POA: Diagnosis not present

## 2017-04-05 DIAGNOSIS — I69353 Hemiplegia and hemiparesis following cerebral infarction affecting right non-dominant side: Secondary | ICD-10-CM | POA: Diagnosis not present

## 2017-04-05 DIAGNOSIS — R208 Other disturbances of skin sensation: Secondary | ICD-10-CM | POA: Diagnosis not present

## 2017-04-05 DIAGNOSIS — R278 Other lack of coordination: Secondary | ICD-10-CM | POA: Diagnosis not present

## 2017-04-05 DIAGNOSIS — R2681 Unsteadiness on feet: Secondary | ICD-10-CM

## 2017-04-05 DIAGNOSIS — M6281 Muscle weakness (generalized): Secondary | ICD-10-CM

## 2017-04-05 DIAGNOSIS — R41842 Visuospatial deficit: Secondary | ICD-10-CM

## 2017-04-05 DIAGNOSIS — I69319 Unspecified symptoms and signs involving cognitive functions following cerebral infarction: Secondary | ICD-10-CM | POA: Diagnosis not present

## 2017-04-05 DIAGNOSIS — R2689 Other abnormalities of gait and mobility: Secondary | ICD-10-CM

## 2017-04-05 NOTE — Therapy (Signed)
[facility] [address] [place], [place], [address] Phone: [phone]   Fax:  [phone]  Occupational Therapy Treatment  Patient Details  Name: Sharon Chan MRN: [id number] Date of Birth: 02-03-1976 Referring Provider: Dr. Claudette Laws  Encounter Date: 04/05/2017      OT End of Session - 04/05/17 1500    Visit Number 6   Number of Visits 21   Date for OT Re-Evaluation 05/15/17   Authorization Type Medicare/Medicaid; g-code needed   Authorization - Visit Number 6   Authorization - Number of Visits 10   OT Start Time 1400   OT Stop Time 1445   OT Time Calculation (min) 45 min   Activity Tolerance Patient tolerated treatment well   Behavior During Therapy [facility] for tasks assessed/performed      Past Medical History:  Diagnosis Date  . CVA (cerebral vascular accident) (HCC)   . Polycystic disease, ovaries   . Seizures (HCC)   . Sickle cell trait [facility])     Past Surgical History:  Procedure Laterality Date  . LAPAROSCOPIC CHOLECYSTECTOMY      There were no vitals filed for this visit.      Subjective Assessment - 04/05/17 1419    Subjective  I had a bad fall in the shower this morning   Pertinent History L CVA 02/25/17; HTN, polycystic kidney disease, hyperlipidemia, sickle cell trait, scans showed 2 old CVAs, sickle cell trait, prediabetes    Currently in Pain? Yes   Pain Score 10-Worst pain ever   Pain Location Head   Pain Orientation Right;Left   Pain Descriptors / Indicators Headache   Pain Type Chronic pain   Pain Onset 1 to 4 weeks ago   Pain Frequency Constant   Aggravating Factors  headaches constant   Pain Relieving Factors meds                      OT Treatments/Exercises (OP) - 04/05/17 0001      ADLs   Home Maintenance Patient indicated that she wants to try ironing as she had tremendous difficulty ironing with left hand.  Worked on ironing in clinic- with  right hand leading- used constraint induced concepts to increase spontaneous use of right UE.  Patient encouraged to stand to iron in clinic to increase stand tolerance - patient benfitted from intermittent verbal cueing to draw attention to right leg in stance.       Visual/Perceptual Exercises   Scanning Tabletop   Scanning - Tabletop Patient reporting difficulty reading, difficult to attend to words on page especially black on white.  Patient was an avid reader prior to her stroke.  Patient did well with short blue highlighted reading focus card to reduce extraneous stimulus and to provide contrast.                  OT Education - 04/05/17 1500    Education provided Yes   Education Details Reading focus card, attention strategies, constraint induced rehab   Person(s) Educated Patient   Methods Explanation   Comprehension Verbalized understanding;Returned demonstration          OT Short Term Goals - 04/05/17 1503      OT SHORT TERM GOAL #1   Title Pt will be independent with initial HEP.--check STGs 04/15/17   Status On-going     OT SHORT TERM GOAL #2   Title Pt will improve R hand coordination/functional reaching as shown by improving  score on box and blocks test by at least 10.   Status On-going     OT SHORT TERM GOAL #3   Title Pt will improve R hand coordination for ADLs as shown by improving time on 9-hole peg test by at least 10sec.   Status On-going     OT SHORT TERM GOAL #4   Title Pt will perform tabletop visual scanning with at least 95% accuracy with incr time.   Status On-going     OT SHORT TERM GOAL #5   Title Pt will verbalize understanding of memory/visual compensation strategies.   Status On-going     OT SHORT TERM GOAL #6   Title Pt will improve R grip strength by at least 8lbs to assist with ADLs.   Status On-going     OT SHORT TERM GOAL #7   Title Pt will perform dressing/bathing without falls x 2 weeks utilizing safety strategies  (supervision).   Status On-going           OT Long Term Goals - 03/16/17 1738      OT LONG TERM GOAL #1   Title Pt will be independent with updated HEP.--check LTGs 05/15/17   Time 8   Period Weeks   Status New     OT LONG TERM GOAL #2   Title Pt will perform simple cooking/home maintenance tasks mod I.   Time 8   Period Weeks   Status New     OT LONG TERM GOAL #3   Title Pt will improve coordination for ADLs as shown by improving time on 9-hole peg test by at least 20sec with RUE.   Baseline R-57.19sec   Time 8   Period Weeks   Status New     OT LONG TERM GOAL #4   Title Pt will perform environmental scanning with at least 90% accuracy for incr safety in the community.   Time 8   Period Weeks   Status New     OT LONG TERM GOAL #5   Title Pt will improve R grip strength by at least 15lbs to assist with ADLs.   Baseline R-18lbs   Time 8   Period Weeks   Status New     Long Term Additional Goals   Additional Long Term Goals Yes     OT LONG TERM GOAL #6   Title Pt will be able to retrieve 3lb object from overhead shelf x3 safely with RUE.   Time 8   Period Weeks   Status New               Plan - 04/05/17 1501    Clinical Impression Statement Patient is continuing to experience frequent falls due to decreased sensation, decreased vision, hemiplegia / weakness, and decreased attention.     Rehab Potential Good   OT Frequency 3x / week   OT Duration 8 weeks   OT Treatment/Interventions Self-care/ADL training;Moist Heat;Fluidtherapy;DME and/or AE instruction;Splinting;Patient/family education;Balance training;Therapeutic exercises;Contrast Bath;Ultrasound;Therapeutic exercise;Therapeutic activities;Cognitive remediation/compensation;Passive range of motion;Functional Mobility Training;Neuromuscular education;Cryotherapy;Electrical Stimulation;Parrafin;Energy conservation;Manual Therapy;Visual/perceptual remediation/compensation   Plan Constraint induced  therapy to increase functional use of RUE, Environmental scanning, functional mobility - dynamic stand balance   Consulted and Agree with Plan of Care Patient      Patient will benefit from skilled therapeutic intervention in order to improve the following deficits and impairments:  Decreased coordination, Impaired sensation, Decreased range of motion, Decreased safety awareness, Decreased activity tolerance, Decreased balance, Decreased knowledge of use of DME, Impaired  UE functional use, Impaired vision/preception, Impaired perceived functional ability, Decreased strength, Decreased mobility, Decreased cognition  Visit Diagnosis: Muscle weakness (generalized)  Other disturbances of skin sensation  Other lack of coordination  Attention and concentration deficit  Unsteadiness on feet  Visuospatial deficit  Hemiplegia and hemiparesis following cerebral infarction affecting right non-dominant side (HCC)  Unspecified symptoms and signs involving cognitive functions following cerebral infarction    Problem List Patient Active Problem List   Diagnosis Date Noted  . Hemiparesis affecting right side as late effect of cerebrovascular accident (HCC) 03/23/2017  . Lacunar infarct, acute (HCC) 03/23/2017  . Visual disturbance as complication of stroke 03/07/2017  . Adjustment disorder with anxious mood   . 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 Sickle cell trait (HCC)   . Hb-SS disease with crisis (HCC)   . Hyperlipidemia   . Thyroid nodule   . Changes in vision   . Acute ischemic stroke (HCC)   . Dysphagia, post-stroke   . Benign essential HTN   . Polycystic kidney disease   . Prediabetes   . Acute intractable headache   . Leukocytosis   . Stroke (cerebrum) (HCC) 02/25/2017    Collier SalinaGellert, Keianna Signer M, OTR/L 04/05/2017, 3:04 PM  [place] [facility] [address] [place], [place],  [address] Phone: [phone]   Fax:  [phone]  Name: Bebe Shaggyiffany R Tungate MRN: [id number] Date of Birth: 08/24/1976

## 2017-04-06 ENCOUNTER — Encounter: Payer: Self-pay | Admitting: Physical Therapy

## 2017-04-06 ENCOUNTER — Ambulatory Visit: Payer: Medicare Other | Admitting: Physical Therapy

## 2017-04-06 ENCOUNTER — Ambulatory Visit: Payer: Medicare Other

## 2017-04-06 ENCOUNTER — Ambulatory Visit: Payer: Medicare Other | Admitting: Occupational Therapy

## 2017-04-06 DIAGNOSIS — R2681 Unsteadiness on feet: Secondary | ICD-10-CM

## 2017-04-06 DIAGNOSIS — R41842 Visuospatial deficit: Secondary | ICD-10-CM

## 2017-04-06 DIAGNOSIS — R2689 Other abnormalities of gait and mobility: Secondary | ICD-10-CM

## 2017-04-06 DIAGNOSIS — R278 Other lack of coordination: Secondary | ICD-10-CM | POA: Diagnosis not present

## 2017-04-06 DIAGNOSIS — R208 Other disturbances of skin sensation: Secondary | ICD-10-CM

## 2017-04-06 DIAGNOSIS — R4701 Aphasia: Secondary | ICD-10-CM

## 2017-04-06 DIAGNOSIS — M6281 Muscle weakness (generalized): Secondary | ICD-10-CM

## 2017-04-06 DIAGNOSIS — R41840 Attention and concentration deficit: Secondary | ICD-10-CM

## 2017-04-06 DIAGNOSIS — I69319 Unspecified symptoms and signs involving cognitive functions following cerebral infarction: Secondary | ICD-10-CM | POA: Diagnosis not present

## 2017-04-06 DIAGNOSIS — I69353 Hemiplegia and hemiparesis following cerebral infarction affecting right non-dominant side: Secondary | ICD-10-CM | POA: Diagnosis not present

## 2017-04-06 NOTE — Therapy (Signed)
[facility] [address] [place], [place], [address] Phone: [phone]   Fax:  [phone]  Occupational Therapy Treatment  Patient Details  Name: NALEE LIGHTLE MRN: [id number] Date of Birth: 09/20/1976 Referring Provider: Dr. Alysia Penna  Encounter Date: 04/06/2017      OT End of Session - 04/06/17 0808    Visit Number 7   Number of Visits 21   Date for OT Re-Evaluation 05/15/17   Authorization Type Medicare/Medicaid; g-code needed   Authorization - Visit Number 7   Authorization - Number of Visits 10   OT Start Time 0803   OT Stop Time 0845   OT Time Calculation (min) 42 min   Activity Tolerance Patient tolerated treatment well   Behavior During Therapy [facility] for tasks assessed/performed      Past Medical History:  Diagnosis Date  . CVA (cerebral vascular accident) ([place])   . Polycystic disease, ovaries   . Seizures ([place])   . Sickle cell trait [facility])     Past Surgical History:  Procedure Laterality Date  . LAPAROSCOPIC CHOLECYSTECTOMY      There were no vitals filed for this visit.      Subjective Assessment - 04/06/17 0806    Subjective  No falls since Tuesday (in shower).  Pt reports that the reading guide helped and that she ordered some for reading and computer.  Pt also reports that she ordered anti-cut gloves   Pertinent History L CVA 02/25/17; HTN, polycystic kidney disease, hyperlipidemia, sickle cell trait, scans showed 2 old CVAs, sickle cell trait, prediabetes    Limitations no driving, fall risk, visual deficits   Currently in Pain? Yes   Pain Score 7    Pain Location Head   Pain Orientation Right   Pain Descriptors / Indicators Headache   Pain Type Chronic pain   Pain Onset More than a month ago   Pain Frequency Constant   Aggravating Factors  light, noise, stress/anxiety   Pain Relieving Factors medication, dark/quiet rooms      Placing grooved pegs in pegboard for incr  coordination with min difficulty, min cues for shoulder compensation.  In standing, functional reaching with wt. Shift to place/remove clothespins with 1-8lb resistance on vertical pole for balance and R hand strength/activity tolerance.  Pt with min difficulty/cues for compensation.  Completing 12-piece puzzle with incr time and min difficulty for visual scanning.   Environmental scanning with ambulation with min cues to incr wt. On R side with divided attention and 14/15 items found initially with remaining item found on 2nd attempt.   Picking up blocks with gripper set on level 1 ([place]) for sustained grip strength with min-mod difficulty.  Arm bike x35mn level 3 for reciprocal movement/conditioning without rest.                      OT Education - 04/06/17 0830    Education Details Discussed how to use m-CIT concepts at home.  Recommended oven mitt on L hand only for sitting tasks due to decr balance (not for standing tasks due to fall risk)   Person(s) Educated Patient   Methods Explanation   Comprehension Verbalized understanding          OT Short Term Goals - 04/06/17 0834      OT SHORT TERM GOAL #1   Title Pt will be independent with initial HEP.--check STGs 04/15/17   Time 4   Period Weeks   Status  Achieved  04/06/17  met     OT SHORT TERM GOAL #2   Title Pt will improve R hand coordination/functional reaching as shown by improving score on box and blocks test by at least 10.   Baseline R-28blocks, L-52blocks   Time 4   Period Weeks   Status On-going     OT SHORT TERM GOAL #3   Title Pt will improve R hand coordination for ADLs as shown by improving time on 9-hole peg test by at least 10sec.   Baseline R-57.19sec   Time 4   Period Weeks   Status On-going     OT SHORT TERM GOAL #4   Title Pt will perform tabletop visual scanning with at least 95% accuracy with incr time.   Time 4   Period Weeks   Status On-going     OT SHORT TERM GOAL  #5   Title Pt will verbalize understanding of memory/visual compensation strategies.   Time 4   Period Weeks   Status Achieved  04/06/17 met     OT SHORT TERM GOAL #6   Title Pt will improve R grip strength by at least 8lbs to assist with ADLs.   Baseline R-18lbs   Time 4   Period Weeks   Status On-going     OT SHORT TERM GOAL #7   Title Pt will perform dressing/bathing without falls x 2 weeks utilizing safety strategies (supervision).   Time 4   Period Weeks   Status On-going           OT Long Term Goals - 03/16/17 1738      OT LONG TERM GOAL #1   Title Pt will be independent with updated HEP.--check LTGs 05/15/17   Time 8   Period Weeks   Status New     OT LONG TERM GOAL #2   Title Pt will perform simple cooking/home maintenance tasks mod I.   Time 8   Period Weeks   Status New     OT LONG TERM GOAL #3   Title Pt will improve coordination for ADLs as shown by improving time on 9-hole peg test by at least 20sec with RUE.   Baseline R-57.19sec   Time 8   Period Weeks   Status New     OT LONG TERM GOAL #4   Title Pt will perform environmental scanning with at least 90% accuracy for incr safety in the community.   Time 8   Period Weeks   Status New     OT LONG TERM GOAL #5   Title Pt will improve R grip strength by at least 15lbs to assist with ADLs.   Baseline R-18lbs   Time 8   Period Weeks   Status New     Long Term Additional Goals   Additional Long Term Goals Yes     OT LONG TERM GOAL #6   Title Pt will be able to retrieve 3lb object from overhead shelf x3 safely with RUE.   Time 8   Period Weeks   Status New               Plan - 04/06/17 0076    Clinical Impression Statement Pt is progressing towards goals, however, decr vision, sensation, attention, and weakness contribute to fall risk.  Pt is improving with environmental scanning in minimally distracting environment, but needs cueing to incr wt. shift to the R with ambulation with  divided attention.   Rehab Potential Good  Clinical Impairments Affecting Rehab Potential decr sensation, fall risk   OT Frequency 3x / week   OT Duration 8 weeks   OT Treatment/Interventions Self-care/ADL training;Moist Heat;Fluidtherapy;DME and/or AE instruction;Splinting;Patient/family education;Balance training;Therapeutic exercises;Contrast Bath;Ultrasound;Therapeutic exercise;Therapeutic activities;Cognitive remediation/compensation;Passive range of motion;Functional Mobility Training;Neuromuscular education;Cryotherapy;Electrical Stimulation;Parrafin;Energy conservation;Manual Therapy;Visual/perceptual remediation/compensation   Plan RUE functional use, environmental scanning, dynamic standing balance during IADL task   OT Home Exercise Plan Education provided:  03/28/17 coordination HEP, red putty HEP , red theraband issued 03/30/17   Consulted and Agree with Plan of Care Patient      Patient will benefit from skilled therapeutic intervention in order to improve the following deficits and impairments:  Decreased coordination, Impaired sensation, Decreased range of motion, Decreased safety awareness, Decreased activity tolerance, Decreased balance, Decreased knowledge of use of DME, Impaired UE functional use, Impaired vision/preception, Impaired perceived functional ability, Decreased strength, Decreased mobility, Decreased cognition  Visit Diagnosis: Hemiplegia and hemiparesis following cerebral infarction affecting right non-dominant side (HCC)  Unspecified symptoms and signs involving cognitive functions following cerebral infarction  Other abnormalities of gait and mobility  Visuospatial deficit  Unsteadiness on feet  Attention and concentration deficit  Other lack of coordination  Other disturbances of skin sensation  Muscle weakness (generalized)    Problem List Patient Active Problem List   Diagnosis Date Noted  . Hemiparesis affecting right side as late effect of  cerebrovascular accident ([place]) 03/23/2017  . Lacunar infarct, acute ([place]) 03/23/2017  . Visual disturbance as complication of stroke 05/69/7948  . Adjustment disorder with anxious mood   . 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 Sickle cell trait ([place])   . Hb-SS disease with crisis ([place])   . Hyperlipidemia   . Thyroid nodule   . Changes in vision   . Acute ischemic stroke ([place])   . Dysphagia, post-stroke   . Benign essential HTN   . Polycystic kidney disease   . Prediabetes   . Acute intractable headache   . Leukocytosis   . Stroke (cerebrum) ([place]) 02/25/2017    [facility] 04/06/2017, 11:46 AM  [facility] [address] [place], [place], [address] Phone: [phone]   Fax:  [phone]  Name: SHAKEYA KERKMAN MRN: [id number] Date of Birth: Aug 04, 1976   Vianne Bulls, OTR/L [facility] [address] [place], [place]  [address] [phone] phone [phone] 04/06/17 11:46 AM

## 2017-04-06 NOTE — Therapy (Signed)
[facility] [address] [place], [place], [address] Phone: [phone]   Fax:  [phone]  Physical Therapy Treatment  Patient Details  Name: Sharon Chan MRN: [id number] Date of Birth: 1976-06-01 Referring Provider: Claudette Laws, MD  Encounter Date: 04/06/2017      PT End of Session - 04/06/17 0937    Visit Number 7   Number of Visits 18   Date for PT Re-Evaluation 05/15/17   Authorization Type G code every 10th visit   PT Start Time 0933   PT Stop Time 1015   PT Time Calculation (min) 42 min   Equipment Utilized During Treatment Gait belt   Activity Tolerance Patient tolerated treatment well   Behavior During Therapy [facility] for tasks assessed/performed      Past Medical History:  Diagnosis Date  . CVA (cerebral vascular accident) (HCC)   . Polycystic disease, ovaries   . Seizures (HCC)   . Sickle cell trait [facility])     Past Surgical History:  Procedure Laterality Date  . LAPAROSCOPIC CHOLECYSTECTOMY      There were no vitals filed for this visit.      Subjective Assessment - 04/06/17 0936    Subjective No new complaints. No falls to report. Continues with headaches, better today. Stayed alone last night, went okay.    Pertinent History sickle cell, polycystic ovarian disease, obesity, thyroid nodule   Patient Stated Goals "To get back to a sense of normalcy", improve sensation in her right side and be independent.   Currently in Pain? Yes   Pain Score 7    Pain Location Head   Pain Descriptors / Indicators Headache   Pain Type Chronic pain   Pain Onset More than a month ago   Pain Frequency Constant   Aggravating Factors  light, stress, loud noise   Pain Relieving Factors meds, essential oils, dark rooms             [facility] Adult PT Treatment/Exercise - 04/06/17 6606      Transfers   Transfers Sit to Stand;Stand to Sit   Sit to Stand 6: Modified independent (Device/Increase  time);With upper extremity assist;From bed;From chair/3-in-1   Stand to Sit 6: Modified independent (Device/Increase time);With upper extremity assist;To bed;To chair/3-in-1     Ambulation/Gait   Ambulation/Gait Yes   Ambulation/Gait Assistance 4: Min guard   Assistive device Straight cane   Gait Pattern Step-through pattern;Decreased stride length;Decreased stance time - right;Decreased step length - left;Decreased dorsiflexion - right   Ambulation Surface Level;Indoor   Gait Comments gait around track while self tossing scarf and naming [place] cities A-Z, cane support with min guard assist for balance.; gait around confined space on carpets, between mats/furniture with obstacles scattered on ground with cane; playing hyper dash game at level 1 progressing to level 3 (which involves cognitive tasks/math)  with min guard assist for balance. cues to attend to right leg with mobility as well.                              Neuro Re-ed    Neuro Re-ed Details  Seated coordination exercises performing ankle DF/PF alternating R and L sides, marching out<>out<>in<>in concurrent with toe<>toe<>heel<>heel pattern x 16 reps; marching concurrent with toe<>heel<>toe<>heel pattern            PT Short Term Goals - 03/17/17 0955      PT SHORT TERM GOAL #1  Title The patient will return demo HEP for R LE strength, dynamic balance activities.   Baseline Target date 04/16/2017   Time 4   Period Weeks     PT SHORT TERM GOAL #2   Title The patient will improve gait speed from 2 ft/sec to > or equal to 2.4 ft/sec to demo increasing functional mobility.   Baseline Target date 04/16/2017   Time 4   Period Weeks     PT SHORT TERM GOAL #3   Title The patient will improve FGA from 8/30 to > or equal to 12/30 to demo improving dynamic balance/gait.   Baseline Target date 04/16/2017   Time 4   Period Weeks     PT SHORT TERM GOAL #4   Title The patient will return demo stair negotiation with one handrail,  reciprocal pattern, modified indep.   Baseline Target date 04/16/2017   Time 4   Period Weeks           PT Long Term Goals - 03/17/17 [id number]      PT LONG TERM GOAL #1   Title The patient will be indep with progression of HEP.   Baseline Target date 05/15/2017   Time 8   Period Weeks     PT LONG TERM GOAL #2   Title The patient will improve Gait speed from 2 ft/sec to > or equal to 2.8 ft/sec to demo return to "full community ambulator" classification of gait.   Baseline Target date 05/15/2017   Time 8   Period Weeks     PT LONG TERM GOAL #3   Title The patient will improve FGA from 8/30 up to 15/30 to demo improving dynamic balance activities.   Baseline Target date 05/15/2017   Time 8   Period Weeks     PT LONG TERM GOAL #4   Title Improve neuro QOL LE from 44.4% up to 60% to demo improving self perception of disability.   Baseline Target date 05/15/2017   Time 8   Period Weeks     PT LONG TERM GOAL #5   Title The patient will improve R LE strength to 5/5.   Baseline Target date 05/15/2017   Time 8   Period Weeks            Plan - 04/06/17 [id number]    Clinical Impression Statement Today's skilled session continued to focus on gait with multi tasking/coordination with no issues reported. Increased guarding/cues needed today due to busy enviroment of gym, however no knee/LE instability noted with session. Pt is progressing towards goals and should benefit from continued PT to progress toward unmet goals.    PT Treatment/Interventions ADLs/Self Care Home Management;Therapeutic activities;Therapeutic exercise;Balance training;Neuromuscular re-education;Gait training;Stair training;Patient/family education;Functional mobility training;Orthotic Fit/Training   PT Next Visit Plan multi-tasking during gait/obstacle negotiation; gait with cognitive tasks; balance; R LE coordination   Consulted and Agree with Plan of Care Patient      Patient will benefit from skilled therapeutic  intervention in order to improve the following deficits and impairments:  Difficulty walking, Decreased activity tolerance, Decreased balance, Decreased mobility, Decreased strength, Impaired sensation  Visit Diagnosis: Muscle weakness (generalized)  Unsteadiness on feet  Other abnormalities of gait and mobility     Problem List Patient Active Problem List   Diagnosis Date Noted  . Hemiparesis affecting right side as late effect of cerebrovascular accident (HCC) 03/23/2017  . Lacunar infarct, acute (HCC) 03/23/2017  . Visual disturbance as complication of stroke 03/07/2017  .  Adjustment disorder with anxious mood   . 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 Sickle cell trait (HCC)   . Hb-SS disease with crisis (HCC)   . Hyperlipidemia   . Thyroid nodule   . Changes in vision   . Acute ischemic stroke (HCC)   . Dysphagia, post-stroke   . Benign essential HTN   . Polycystic kidney disease   . Prediabetes   . Acute intractable headache   . Leukocytosis   . Stroke (cerebrum) (HCC) 02/25/2017    Sallyanne Kuster, PTA, [facility] Outpatient Neuro [facility] [address] [place], [place] [address] [phone] 04/06/17, 10:36 PM   Name: Sharon Chan MRN: [id number] Date of Birth: [birth year]

## 2017-04-06 NOTE — Therapy (Signed)
[facility] [address] [place], [place], [address] Phone: [phone]   Fax:  [phone]  Physical Therapy Treatment  Patient Details  Name: Sharon Chan MRN: [id number] Date of Birth: 1976-10-01 Referring Provider: Claudette Laws, MD  Encounter Date: 04/05/2017      PT End of Session - 04/05/17 1455    Visit Number 6   Number of Visits 18   Date for PT Re-Evaluation 05/15/17   Authorization Type G code every 10th visit   PT Start Time 1448   PT Stop Time 1530   PT Time Calculation (min) 42 min   Equipment Utilized During Treatment Gait belt   Activity Tolerance Patient tolerated treatment well   Behavior During Therapy [facility] for tasks assessed/performed      Past Medical History:  Diagnosis Date  . CVA (cerebral vascular accident) (HCC)   . Polycystic disease, ovaries   . Seizures (HCC)   . Sickle cell trait [facility])     Past Surgical History:  Procedure Laterality Date  . LAPAROSCOPIC CHOLECYSTECTOMY      There were no vitals filed for this visit.      Subjective Assessment - 04/05/17 1451    Subjective Had a fall this am in shower, was standing to get in (to bring legs over tub). Fell backwards into shower bench and then to the floor. Mom assisted her up. Denies any injuries. No other changes. Continues to have headaches.                           Pertinent History sickle cell, polycystic ovarian disease, obesity, thyroid nodule   Patient Stated Goals "To get back to a sense of normalcy", improve sensation in her right side and be independent.   Currently in Pain? Yes   Pain Score 10-Worst pain ever   Pain Orientation Right   Pain Descriptors / Indicators Headache   Pain Type Chronic pain   Pain Onset More than a month ago   Pain Frequency Constant   Aggravating Factors  light, noise, stress/anxiety   Pain Relieving Factors medication, dark/quiet rooms when really bad              [facility] Adult PT Treatment/Exercise - 04/05/17 1455      Transfers   Transfers Sit to Stand;Stand to Sit   Sit to Stand 6: Modified independent (Device/Increase time);With upper extremity assist;From bed;From chair/3-in-1   Stand to Sit 6: Modified independent (Device/Increase time);With upper extremity assist;To bed;To chair/3-in-1     Ambulation/Gait   Ambulation/Gait Yes   Ambulation/Gait Assistance 4: Min guard;4: Min assist   Ambulation/Gait Assistance Details gait with cane during balance activities. cues for increased step/stride length, weight shifting    Assistive device Straight cane   Gait Pattern Step-through pattern;Decreased stride length;Decreased stance time - right;Decreased step length - left;Decreased dorsiflexion - right   Ambulation Surface Level;Indoor   Gait Comments gait around track while self tossing scarf and naming animals A-Z with cues to keep going and to addres gait deviations;  gait around gym scanning for colored cones (PTA gave specific color to find) while naming foods from A-Z     Therapeutic Activites    Therapeutic Activities Other Therapeutic Activities   Other Therapeutic Activities tub transfer simulation with tub bench: had pt practice sliding in/out of tub after reporting falling with stepping over tub ledge vs sliding with bench. pt able to demo with supervision and  cues only.      Neuro Re-ed    Neuro Re-ed Details  Seated coordination exercises performing ankle DF/PF alternating R and L sides, marching out<>out<>in<>in concurrent with toe<>toe<>heel<>heel pattern x 16 reps; marching concurrent with toe<>heel<>toe<>heel pattern            PT Short Term Goals - 03/17/17 0955      PT SHORT TERM GOAL #1   Title The patient will return demo HEP for R LE strength, dynamic balance activities.   Baseline Target date 04/16/2017   Time 4   Period Weeks     PT SHORT TERM GOAL #2   Title The patient will improve gait speed from 2 ft/sec to > or  equal to 2.4 ft/sec to demo increasing functional mobility.   Baseline Target date 04/16/2017   Time 4   Period Weeks     PT SHORT TERM GOAL #3   Title The patient will improve FGA from 8/30 to > or equal to 12/30 to demo improving dynamic balance/gait.   Baseline Target date 04/16/2017   Time 4   Period Weeks     PT SHORT TERM GOAL #4   Title The patient will return demo stair negotiation with one handrail, reciprocal pattern, modified indep.   Baseline Target date 04/16/2017   Time 4   Period Weeks           PT Long Term Goals - 03/17/17 1610      PT LONG TERM GOAL #1   Title The patient will be indep with progression of HEP.   Baseline Target date 05/15/2017   Time 8   Period Weeks     PT LONG TERM GOAL #2   Title The patient will improve Gait speed from 2 ft/sec to > or equal to 2.8 ft/sec to demo return to "full community ambulator" classification of gait.   Baseline Target date 05/15/2017   Time 8   Period Weeks     PT LONG TERM GOAL #3   Title The patient will improve FGA from 8/30 up to 15/30 to demo improving dynamic balance activities.   Baseline Target date 05/15/2017   Time 8   Period Weeks     PT LONG TERM GOAL #4   Title Improve neuro QOL LE from 44.4% up to 60% to demo improving self perception of disability.   Baseline Target date 05/15/2017   Time 8   Period Weeks     PT LONG TERM GOAL #5   Title The patient will improve R LE strength to 5/5.   Baseline Target date 05/15/2017   Time 8   Period Weeks             Plan - 04/05/17 1455    Clinical Impression Statement Today's skilled session focused on safety in home and high level balance with muli-tasking/cognitive tasks. Pt is making steady progress toward goals and should benefit from continued PT to progress toward goals.    PT Treatment/Interventions ADLs/Self Care Home Management;Therapeutic activities;Therapeutic exercise;Balance training;Neuromuscular re-education;Gait training;Stair  training;Patient/family education;Functional mobility training;Orthotic Fit/Training   PT Next Visit Plan multi-tasking during gait/obstacle negotiation; gait with cognitive tasks; balance; R LE coordination   Consulted and Agree with Plan of Care Patient      Patient will benefit from skilled therapeutic intervention in order to improve the following deficits and impairments:  Abnormal gait, Difficulty walking, Decreased activity tolerance, Decreased balance, Decreased mobility, Decreased strength, Impaired sensation  Visit Diagnosis: Muscle weakness (generalized)  Other disturbances of skin sensation  Unsteadiness on feet  Other abnormalities of gait and mobility  Hemiplegia and hemiparesis following cerebral infarction affecting right non-dominant side [facility](HCC)     Problem List Patient Active Problem List   Diagnosis Date Noted  . Hemiparesis affecting right side as late effect of cerebrovascular accident (HCC) 03/23/2017  . Lacunar infarct, acute (HCC) 03/23/2017  . Visual disturbance as complication of stroke 03/07/2017  . Adjustment disorder with anxious mood   . 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 Sickle cell trait (HCC)   . Hb-SS disease with crisis (HCC)   . Hyperlipidemia   . Thyroid nodule   . Changes in vision   . Acute ischemic stroke (HCC)   . Dysphagia, post-stroke   . Benign essential HTN   . Polycystic kidney disease   . Prediabetes   . Acute intractable headache   . Leukocytosis   . Stroke (cerebrum) (HCC) 02/25/2017    Sallyanne KusterKathy Rafaela Dinius, PTA, [facility] [address] [place], [place] [address] [phone] 04/06/17, 8:31 AM   Name: Sharon Chan MRN: [id number] Date of Birth: 08/16/1976

## 2017-04-06 NOTE — Therapy (Signed)
[facility] [address] [place], [place], [address] Phone: [phone]   Fax:  [phone]  Speech Language Pathology Treatment  Patient Details  Name: Sharon Chan MRN: [id number] Date of Birth: [birth year] No Data Recorded  Encounter Date: 04/06/2017      End of Session - 04/06/17 1310    Visit Number 2   Number of Visits 17   Date for SLP Re-Evaluation 06/02/17   SLP Start Time 0850   SLP Stop Time  0931   SLP Time Calculation (min) 41 min   Activity Tolerance Patient tolerated treatment well      Past Medical History:  Diagnosis Date  . CVA (cerebral vascular accident) (HCC)   . Polycystic disease, ovaries   . Seizures (HCC)   . Sickle cell trait [facility])     Past Surgical History:  Procedure Laterality Date  . LAPAROSCOPIC CHOLECYSTECTOMY      There were no vitals filed for this visit.      Subjective Assessment - 04/06/17 0852    Subjective "The reading guides made a huge difference for me." Pt also thinks her speech is better.   Currently in Pain? Yes   Pain Score 7    Pain Location Head   Pain Orientation Right   Pain Descriptors / Indicators Headache   Pain Type Chronic pain   Pain Onset More than a month ago   Pain Frequency Constant   Aggravating Factors  light, stress, loud noise   Pain Relieving Factors meds, essential oils, dark rooms               ADULT SLP TREATMENT - 04/06/17 0857      General Information   Behavior/Cognition Alert;Pleasant mood;Cooperative     Treatment Provided   Treatment provided Cognitive-Linquistic     Cognitive-Linquistic Treatment   Treatment focused on Aphasia   Skilled Treatment Pt thinks she is having less frequent anomic episodes than at eval. To target pt's naming, she described pictures with extra time consistently and usual min A from SLP for word finding and use of compensations. In conversation of simple-mod complex nature, pt  used compensations (circumlocution, synonym) successfully 90-95% of the time.     Assessment / Recommendations / Plan   Plan Continue with current plan of care     Progression Toward Goals   Progression toward goals Progressing toward goals          SLP Education - 04/06/17 1309    Education provided Yes   Education Details compensations for anomia   Person(s) Educated Patient   Methods Explanation;Demonstration;Verbal cues   Comprehension Need further instruction;Returned demonstration;Verbalized understanding;Verbal cues required          SLP Short Term Goals - 04/06/17 1317      SLP SHORT TERM GOAL #1   Title Pt will tell four ways to compensate for anomia   Time 4   Period Weeks   Status On-going     SLP SHORT TERM GOAL #2   Title pt will complete mod complex naming tasks independently with 90% success over three sessions   Time 4   Period Weeks   Status On-going     SLP SHORT TERM GOAL #3   Title pt will engage functionally in 10 minutes mod complex/complex conversation using compensatory strategies PRN over three sessions   Time 4   Period Weeks   Status On-going          SLP Long  Term Goals - 04/06/17 1318      SLP LONG TERM GOAL #1   Title pt will engage functionally in 25 minutes mostly complex conversation outside ST room, using compensatory strategies PRN over three sessions   Time 8   Period Weeks   Status On-going          Plan - 04/06/17 1310    Clinical Impression Statement Pt presents today with word finding deficts in conversation and focused descrtiption tasks. She used compensations with good to excellent success i nsimple/mod complex conversation. Skilled ST is recommended for improving her verbal expression as close to PLOF as possible.    Speech Therapy Frequency 2x / week   Duration --  8 weeks   Treatment/Interventions Language facilitation;Compensatory techniques;Internal/external aids;SLP instruction and feedback;Multimodal  communcation approach;Patient/family education;Functional tasks   Potential to Achieve Goals Good      Patient will benefit from skilled therapeutic intervention in order to improve the following deficits and impairments:   Aphasia    Problem List Patient Active Problem List   Diagnosis Date Noted  . Hemiparesis affecting right side as late effect of cerebrovascular accident (HCC) 03/23/2017  . Lacunar infarct, acute (HCC) 03/23/2017  . Visual disturbance as complication of stroke 03/07/2017  . Adjustment disorder with anxious mood   . 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 Sickle cell trait (HCC)   . Hb-SS disease with crisis (HCC)   . Hyperlipidemia   . Thyroid nodule   . Changes in vision   . Acute ischemic stroke (HCC)   . Dysphagia, post-stroke   . Benign essential HTN   . Polycystic kidney disease   . Prediabetes   . Acute intractable headache   . Leukocytosis   . Stroke (cerebrum) (HCC) 02/25/2017    Benecio Kluger ,MS, [name]   04/06/2017, 1:18 PM  [facility] [address] [place], [place], [address] Phone: [phone]   Fax:  [phone]   Name: Sharon Chan MRN: [id number] Date of Birth: 07/06/1976

## 2017-04-11 ENCOUNTER — Encounter: Payer: Medicare Other | Admitting: Occupational Therapy

## 2017-04-12 ENCOUNTER — Ambulatory Visit: Payer: Medicare Other | Admitting: Rehabilitative and Restorative Service Providers"

## 2017-04-12 ENCOUNTER — Ambulatory Visit: Payer: Medicare Other | Admitting: Occupational Therapy

## 2017-04-12 ENCOUNTER — Telehealth: Payer: Self-pay | Admitting: Oncology

## 2017-04-12 VITALS — BP 110/72

## 2017-04-12 DIAGNOSIS — R2689 Other abnormalities of gait and mobility: Secondary | ICD-10-CM

## 2017-04-12 DIAGNOSIS — R278 Other lack of coordination: Secondary | ICD-10-CM

## 2017-04-12 DIAGNOSIS — R2681 Unsteadiness on feet: Secondary | ICD-10-CM

## 2017-04-12 DIAGNOSIS — M6281 Muscle weakness (generalized): Secondary | ICD-10-CM

## 2017-04-12 DIAGNOSIS — R208 Other disturbances of skin sensation: Secondary | ICD-10-CM | POA: Diagnosis not present

## 2017-04-12 DIAGNOSIS — I69353 Hemiplegia and hemiparesis following cerebral infarction affecting right non-dominant side: Secondary | ICD-10-CM | POA: Diagnosis not present

## 2017-04-12 DIAGNOSIS — R41840 Attention and concentration deficit: Secondary | ICD-10-CM | POA: Diagnosis not present

## 2017-04-12 DIAGNOSIS — I69319 Unspecified symptoms and signs involving cognitive functions following cerebral infarction: Secondary | ICD-10-CM | POA: Diagnosis not present

## 2017-04-12 DIAGNOSIS — R41842 Visuospatial deficit: Secondary | ICD-10-CM

## 2017-04-12 NOTE — Telephone Encounter (Signed)
Faxed records to Dr. Sherwood GamblerAtaga.    Waiting for a appt.

## 2017-04-12 NOTE — Therapy (Signed)
[facility] [address] [place], [place], [address] Phone: [phone]   Fax:  [phone]  Physical Therapy Treatment  Patient Details  Name: Sharon Chan MRN: [id number] Date of Birth: [birth year] Referring Provider: Alysia Penna, MD  Encounter Date: 04/12/2017      PT End of Session - 04/12/17 1044    Visit Number 8   Number of Visits 18   Date for PT Re-Evaluation 05/15/17   Authorization Type G code every 10th visit   PT Start Time 1020   PT Stop Time 1100   PT Time Calculation (min) 40 min   Equipment Utilized During Treatment Gait belt   Activity Tolerance Patient tolerated treatment well   Behavior During Therapy [facility] for tasks assessed/performed      Past Medical History:  Diagnosis Date  . CVA (cerebral vascular accident) ([place])   . Polycystic disease, ovaries   . Seizures ([place])   . Sickle cell trait [facility])     Past Surgical History:  Procedure Laterality Date  . LAPAROSCOPIC CHOLECYSTECTOMY      Vitals:   04/12/17 1026  BP: 110/72        Subjective Assessment - 04/12/17 1023    Subjective The patient notes that she has increased UE numbness bilaterally, noting her BP was okay when taken at home.  She notes falls due to dropping the cane.  She reports 4 falls since last being seen at PT.   She reports she visited her sister's home and she notes she struggled going down steps.   Pertinent History sickle cell, polycystic ovarian disease, obesity, thyroid nodule   Patient Stated Goals "To get back to a sense of normalcy", improve sensation in her right side and be independent.            [facility] PT Assessment - 04/12/17 1045      Ambulation/Gait   Assistive device Straight cane   Gait velocity 2.14 ft/sec     Functional Gait  Assessment   Gait assessed  Yes   Gait Level Surface Walks 20 ft, slow speed, abnormal gait pattern, evidence for imbalance or deviates 10-15 in outside  of the 12 in walkway width. Requires more than 7 sec to ambulate 20 ft.   Change in Gait Speed Able to change speed, demonstrates mild gait deviations, deviates 6-10 in outside of the 12 in walkway width, or no gait deviations, unable to achieve a major change in velocity, or uses a change in velocity, or uses an assistive device.   Gait with Horizontal Head Turns Performs head turns smoothly with slight change in gait velocity (eg, minor disruption to smooth gait path), deviates 6-10 in outside 12 in walkway width, or uses an assistive device.   Gait with Vertical Head Turns Performs task with slight change in gait velocity (eg, minor disruption to smooth gait path), deviates 6 - 10 in outside 12 in walkway width or uses assistive device   Gait and Pivot Turn Pivot turns safely in greater than 3 sec and stops with no loss of balance, or pivot turns safely within 3 sec and stops with mild imbalance, requires small steps to catch balance.   Step Over Obstacle Is able to step over one shoe box (4.5 in total height) but must slow down and adjust steps to clear box safely. May require verbal cueing.   Gait with Narrow Base of Support Ambulates 4-7 steps.   Gait with Eyes Closed Cannot walk 20  ft without assistance, severe gait deviations or imbalance, deviates greater than 15 in outside 12 in walkway width or will not attempt task.   Ambulating Backwards Cannot walk 20 ft without assistance, severe gait deviations or imbalance, deviates greater than 15 in outside 12 in walkway width or will not attempt task.   Steps Alternating feet, must use rail.   Total Score 13   FGA comment: 13/30                     [facility] Adult PT Treatment/Exercise - 04/12/17 1045      Ambulation/Gait   Ambulation/Gait Yes   Ambulation/Gait Assistance 5: Supervision   Ambulation/Gait Assistance Details Gait with [place] working on longer stride length and consistent pace with metronome for auditory cues.   Ambulation  Distance (Feet) 400 Feet   Ambulation Surface Level;Indoor   Stairs Yes   Stairs Assistance 6: Modified independent (Device/Increase time)   Stair Management Technique Two rails;Alternating pattern   Number of Stairs 20   Pre-Gait Activities PT worked on stair negotiation with reciprocal pattern. Patient able to perform mod indep with rails, however has slowed pace.    Gait Comments Gait in busy environment with and without assistive device.  PT performing supervision when with SPC and CGA when without device.      Therapeutic Activites    Therapeutic Activities Other Therapeutic Activities   Other Therapeutic Activities  Floor<>stand transfers x 2 repetitions working on technique, discussion of scooting/crawling to support surface.     Neuro Re-ed    Neuro Re-ed Details  Worked on switching SPC to right hand to encourage right weight shift and working on coordination.                  PT Education - 04/12/17 1222    Education provided Yes   Education Details Floor<>stand transfer technique   Person(s) Educated Patient   Methods Explanation;Demonstration   Comprehension Verbalized understanding;Returned demonstration          PT Short Term Goals - 04/12/17 1027      PT SHORT TERM GOAL #1   Title The patient will return demo HEP for R LE strength, dynamic balance activities.   Baseline Target date 04/16/2017   Time 4   Period Weeks     PT SHORT TERM GOAL #2   Title The patient will improve gait speed from 2 ft/sec to > or equal to 2.4 ft/sec to demo increasing functional mobility.   Baseline Target date 04/16/2017   Time 4   Period Weeks     PT SHORT TERM GOAL #3   Title The patient will improve FGA from 8/30 to > or equal to 12/30 to demo improving dynamic balance/gait.   Baseline scored 13/30 on FGA on 04/12/2017   Time 4   Period Weeks   Status Achieved     PT SHORT TERM GOAL #4   Title The patient will return demo stair negotiation with one handrail,  reciprocal pattern, modified indep.   Baseline The patient is able to perform this mod indep, however expresses fear of falling and has slowed pace to perform.   Time 4   Period Weeks   Status Achieved           PT Long Term Goals - 03/17/17 6222      PT LONG TERM GOAL #1   Title The patient will be indep with progression of HEP.   Baseline Target date 05/15/2017  Time 8   Period Weeks     PT LONG TERM GOAL #2   Title The patient will improve Gait speed from 2 ft/sec to > or equal to 2.8 ft/sec to demo return to "full community ambulator" classification of gait.   Baseline Target date 05/15/2017   Time 8   Period Weeks     PT LONG TERM GOAL #3   Title The patient will improve FGA from 8/30 up to 15/30 to demo improving dynamic balance activities.   Baseline Target date 05/15/2017   Time 8   Period Weeks     PT LONG TERM GOAL #4   Title Improve neuro QOL LE from 44.4% up to 60% to demo improving self perception of disability.   Baseline Target date 05/15/2017   Time 8   Period Weeks     PT LONG TERM GOAL #5   Title The patient will improve R LE strength to 5/5.   Baseline Target date 05/15/2017   Time 8   Period Weeks               Plan - 04/12/17 1227    Clinical Impression Statement The patient met 2 STGs.  Falls continue to be an issue in her home, however she does not appear to be high fall risk when in the clinic.  PT naticipates attention to task and sensation contribute to falls.  No injury with falls.  PT to continue working towards STGs/LTGs.    PT Treatment/Interventions ADLs/Self Care Home Management;Therapeutic activities;Therapeutic exercise;Balance training;Neuromuscular re-education;Gait training;Stair training;Patient/family education;Functional mobility training;Orthotic Fit/Training   PT Next Visit Plan check STGs*, continue multi-tasking, stair negotiation with reciprocal pattern, balance/ coordination, obstacle negotiation/cognitive tasks.   Consulted  and Agree with Plan of Care Patient      Patient will benefit from skilled therapeutic intervention in order to improve the following deficits and impairments:  Difficulty walking, Decreased activity tolerance, Decreased balance, Decreased mobility, Decreased strength, Impaired sensation  Visit Diagnosis: Other abnormalities of gait and mobility  Unsteadiness on feet  Muscle weakness (generalized)     Problem List Patient Active Problem List   Diagnosis Date Noted  . Hemiparesis affecting right side as late effect of cerebrovascular accident ([place]) 03/23/2017  . Lacunar infarct, acute ([place]) 03/23/2017  . Visual disturbance as complication of stroke 29/47/6546  . Adjustment disorder with anxious mood   . 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 Sickle cell trait ([place])   . Hb-SS disease with crisis ([place])   . Hyperlipidemia   . Thyroid nodule   . Changes in vision   . Acute ischemic stroke ([place])   . Dysphagia, post-stroke   . Benign essential HTN   . Polycystic kidney disease   . Prediabetes   . Acute intractable headache   . Leukocytosis   . Stroke (cerebrum) ([place]) 02/25/2017    Juanmanuel Marohl , PT 04/12/2017, 12:28 PM  [facility] [address] [place], [place], [address] Phone: [phone]   Fax:  [phone]  Name: Sharon Chan MRN: [id number] Date of Birth: September 28, 1976

## 2017-04-12 NOTE — Therapy (Signed)
[facility] [address] [place], [place], [address] Phone: [phone]   Fax:  [phone]  Occupational Therapy Treatment  Patient Details  Name: Sharon Chan MRN: [id number] Date of Birth: [birth year] Referring Provider: Dr. Alysia Penna  Encounter Date: 04/12/2017      OT End of Session - 04/12/17 1344    Visit Number 8   Number of Visits 21   Date for OT Re-Evaluation 05/15/17   Authorization Type Medicare/Medicaid; g-code needed   Authorization - Visit Number 8   Authorization - Number of Visits 10   OT Start Time 1100   OT Stop Time 1145   OT Time Calculation (min) 45 min   Activity Tolerance Patient tolerated treatment well      Past Medical History:  Diagnosis Date  . CVA (cerebral vascular accident) ([place])   . Polycystic disease, ovaries   . Seizures ([place])   . Sickle cell trait [facility])     Past Surgical History:  Procedure Laterality Date  . LAPAROSCOPIC CHOLECYSTECTOMY      There were no vitals filed for this visit.      Subjective Assessment - 04/12/17 1105    Subjective  My whole Rt side has been numb since the stroke    Pertinent History L CVA 02/25/17; HTN, polycystic kidney disease, hyperlipidemia, sickle cell trait, scans showed 2 old CVAs, sickle cell trait, prediabetes    Limitations no driving, fall risk, visual deficits   Currently in Pain? No/denies                      OT Treatments/Exercises (OP) - 04/12/17 0001      Visual/Perceptual Exercises   Scanning Environmental   Scanning - Environmental Pt id 12/16 items (75% accuracy) while carrying on conversation for divided attn. on first trial (missed 2 on Lt and 2 on Rt). Pt found 2/4 remaining items on 2nd trial. Pt  missed some on Lt when focused on Rt side, or if 2 items were on RT - would only see 1.    Scanning - Tabletop Pt performing tabletop scanning to match analogue and digital times - Pt with  increased time to scan and match times on far Rt side, especially lower Rt side     Standing to place small pegs in pegboard on vertical surface while performing wt. shifts from Lt to Rt side, and Rt to Lt side for: coordination, RUE functional reaching, and functional balance. Pt able to perform activity w/o LOB, with close sup to CGA. Pt with only min drops Rt hand.              OT Short Term Goals - 04/06/17 0834      OT SHORT TERM GOAL #1   Title Pt will be independent with initial HEP.--check STGs 04/15/17   Time 4   Period Weeks   Status Achieved  04/06/17  met     OT SHORT TERM GOAL #2   Title Pt will improve R hand coordination/functional reaching as shown by improving score on box and blocks test by at least 10.   Baseline R-28blocks, L-52blocks   Time 4   Period Weeks   Status On-going     OT SHORT TERM GOAL #3   Title Pt will improve R hand coordination for ADLs as shown by improving time on 9-hole peg test by at least 10sec.   Baseline R-57.19sec   Time 4   Period Weeks  Status On-going     OT SHORT TERM GOAL #4   Title Pt will perform tabletop visual scanning with at least 95% accuracy with incr time.   Time 4   Period Weeks   Status On-going     OT SHORT TERM GOAL #5   Title Pt will verbalize understanding of memory/visual compensation strategies.   Time 4   Period Weeks   Status Achieved  04/06/17 met     OT SHORT TERM GOAL #6   Title Pt will improve R grip strength by at least 8lbs to assist with ADLs.   Baseline R-18lbs   Time 4   Period Weeks   Status On-going     OT SHORT TERM GOAL #7   Title Pt will perform dressing/bathing without falls x 2 weeks utilizing safety strategies (supervision).   Time 4   Period Weeks   Status On-going           OT Long Term Goals - 03/16/17 1738      OT LONG TERM GOAL #1   Title Pt will be independent with updated HEP.--check LTGs 05/15/17   Time 8   Period Weeks   Status New     OT LONG TERM GOAL  #2   Title Pt will perform simple cooking/home maintenance tasks mod I.   Time 8   Period Weeks   Status New     OT LONG TERM GOAL #3   Title Pt will improve coordination for ADLs as shown by improving time on 9-hole peg test by at least 20sec with RUE.   Baseline R-57.19sec   Time 8   Period Weeks   Status New     OT LONG TERM GOAL #4   Title Pt will perform environmental scanning with at least 90% accuracy for incr safety in the community.   Time 8   Period Weeks   Status New     OT LONG TERM GOAL #5   Title Pt will improve R grip strength by at least 15lbs to assist with ADLs.   Baseline R-18lbs   Time 8   Period Weeks   Status New     Long Term Additional Goals   Additional Long Term Goals Yes     OT LONG TERM GOAL #6   Title Pt will be able to retrieve 3lb object from overhead shelf x3 safely with RUE.   Time 8   Period Weeks   Status New               Plan - 04/12/17 1346    Clinical Impression Statement Pt did very well motorically today with wt. shifts in controlled setting, but pt hesistant d/t decr. sensation. Pt with slightly decr. accuracy with environmental scanning today   Rehab Potential Good   Current Impairments/barriers affecting progress: decr sensation, fall risk   OT Frequency 2x / week   OT Duration 8 weeks   OT Treatment/Interventions Self-care/ADL training;Moist Heat;Fluidtherapy;DME and/or AE instruction;Splinting;Patient/family education;Balance training;Therapeutic exercises;Contrast Bath;Ultrasound;Therapeutic exercise;Therapeutic activities;Cognitive remediation/compensation;Passive range of motion;Functional Mobility Training;Neuromuscular education;Cryotherapy;Electrical Stimulation;Parrafin;Energy conservation;Manual Therapy;Visual/perceptual remediation/compensation   Plan check remaining STG's   OT Home Exercise Plan Education provided:  03/28/17 coordination HEP, red putty HEP , red theraband issued 03/30/17   Consulted and Agree  with Plan of Care Patient      Patient will benefit from skilled therapeutic intervention in order to improve the following deficits and impairments:  Decreased coordination, Impaired sensation, Decreased range of motion, Decreased safety awareness,  Decreased activity tolerance, Decreased balance, Decreased knowledge of use of DME, Impaired UE functional use, Impaired vision/preception, Impaired perceived functional ability, Decreased strength, Decreased mobility, Decreased cognition  Visit Diagnosis: Unsteadiness on feet  Other lack of coordination  Visuospatial deficit  Hemiplegia and hemiparesis following cerebral infarction affecting right non-dominant side [facility])    Problem List Patient Active Problem List   Diagnosis Date Noted  . Hemiparesis affecting right side as late effect of cerebrovascular accident ([place]) 03/23/2017  . Lacunar infarct, acute ([place]) 03/23/2017  . Visual disturbance as complication of stroke 32/12/3341  . Adjustment disorder with anxious mood   . 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 Sickle cell trait ([place])   . Hb-SS disease with crisis ([place])   . Hyperlipidemia   . Thyroid nodule   . Changes in vision   . Acute ischemic stroke ([place])   . Dysphagia, post-stroke   . Benign essential HTN   . Polycystic kidney disease   . Prediabetes   . Acute intractable headache   . Leukocytosis   . Stroke (cerebrum) ([place]) 02/25/2017    Carey Bullocks, OTR/L 04/12/2017, 1:48 PM  [facility] [address] [place], [place], [address] Phone: [phone]   Fax:  [phone]  Name: Sharon Chan MRN: [id number] Date of Birth: 08/05/1976

## 2017-04-13 ENCOUNTER — Ambulatory Visit: Payer: Medicare Other | Admitting: Rehabilitative and Restorative Service Providers"

## 2017-04-13 ENCOUNTER — Ambulatory Visit: Payer: Medicare Other | Admitting: Occupational Therapy

## 2017-04-13 ENCOUNTER — Ambulatory Visit: Payer: Medicare Other

## 2017-04-13 DIAGNOSIS — R41840 Attention and concentration deficit: Secondary | ICD-10-CM

## 2017-04-13 DIAGNOSIS — I69319 Unspecified symptoms and signs involving cognitive functions following cerebral infarction: Secondary | ICD-10-CM | POA: Diagnosis not present

## 2017-04-13 DIAGNOSIS — I69353 Hemiplegia and hemiparesis following cerebral infarction affecting right non-dominant side: Secondary | ICD-10-CM | POA: Diagnosis not present

## 2017-04-13 DIAGNOSIS — R2689 Other abnormalities of gait and mobility: Secondary | ICD-10-CM

## 2017-04-13 DIAGNOSIS — R278 Other lack of coordination: Secondary | ICD-10-CM

## 2017-04-13 DIAGNOSIS — R208 Other disturbances of skin sensation: Secondary | ICD-10-CM | POA: Diagnosis not present

## 2017-04-13 DIAGNOSIS — R2681 Unsteadiness on feet: Secondary | ICD-10-CM

## 2017-04-13 DIAGNOSIS — R41842 Visuospatial deficit: Secondary | ICD-10-CM

## 2017-04-13 DIAGNOSIS — R4701 Aphasia: Secondary | ICD-10-CM

## 2017-04-13 DIAGNOSIS — M6281 Muscle weakness (generalized): Secondary | ICD-10-CM | POA: Diagnosis not present

## 2017-04-13 NOTE — Therapy (Signed)
[facility] [address] [place], [place], [address] Phone: [phone]   Fax:  [phone]  Occupational Therapy Treatment  Patient Details  Name: Sharon Chan MRN: [id number] Date of Birth: 06-15-1976 Referring Provider: Dr. Alysia Penna  Encounter Date: 04/13/2017      OT End of Session - 04/13/17 0807    Visit Number 9   Number of Visits 21   Date for OT Re-Evaluation 05/15/17   Authorization Type Medicare/Medicaid; g-code needed   Authorization - Visit Number 9   Authorization - Number of Visits 10   OT Start Time 0803   OT Stop Time 0845   OT Time Calculation (min) 42 min   Activity Tolerance Patient tolerated treatment well   Behavior During Therapy [facility] for tasks assessed/performed      Past Medical History:  Diagnosis Date  . CVA (cerebral vascular accident) ([place])   . Polycystic disease, ovaries   . Seizures ([place])   . Sickle cell trait [facility])     Past Surgical History:  Procedure Laterality Date  . LAPAROSCOPIC CHOLECYSTECTOMY      There were no vitals filed for this visit.      Subjective Assessment - 04/13/17 0805    Subjective  Pt reports a couple of falls over the holiday. [place] going down stairs, getting up from chair)   Pertinent History L CVA 02/25/17; HTN, polycystic kidney disease, hyperlipidemia, sickle cell trait, scans showed 2 old CVAs, sickle cell trait, prediabetes    Limitations no driving, fall risk, visual deficits   Currently in Pain? No/denies        Picking up blocks with gripper set on level 1 (black spring) for sustained grip strength with min difficulty.  Continued checking STGs and discussing progress--see goal section below.  Tabletop visual scanning activities:  Number cancellation with approx 52% in busy environment (but no specific pattern to errors)--pt reports difficulty with black on white contrast.  Then number cancellation using blue reading guide  with approx 78% accuracy.  Then hidden pictures for visual scanning/attention with incr time 9/18 items found (did not finish due to time constraints)--pt to finish for homework.                          OT Short Term Goals - 04/13/17 0809      OT SHORT TERM GOAL #1   Title Pt will be independent with initial HEP.--check STGs 04/15/17   Time 4   Period Weeks   Status Achieved  04/06/17  met     OT SHORT TERM GOAL #2   Title Pt will improve R hand coordination/functional reaching as shown by improving score on box and blocks test by at least 10.   Baseline R-28blocks, L-52blocks   Time 4   Period Weeks   Status Achieved  04/13/17:  38 blocks     OT SHORT TERM GOAL #3   Title Pt will improve R hand coordination for ADLs as shown by improving time on 9-hole peg test by at least 10sec.   Baseline R-57.19sec   Time 4   Period Weeks   Status Achieved  04/13/17:  35.94sec     OT SHORT TERM GOAL #4   Title Pt will perform tabletop visual scanning with at least 95% accuracy with incr time.   Time 4   Period Weeks   Status Not Met  04/13/17:  inconsistent approx 78% with reading guide  OT SHORT TERM GOAL #5   Title Pt will verbalize understanding of memory/visual compensation strategies.   Time 4   Period Weeks   Status Achieved  04/06/17 met     OT SHORT TERM GOAL #6   Title Pt will improve R grip strength by at least 8lbs to assist with ADLs.   Baseline R-18lbs   Time 4   Period Weeks   Status Achieved  04/13/17:  28lbs     OT SHORT TERM GOAL #7   Title Pt will perform dressing/bathing without falls x 2 weeks utilizing safety strategies (supervision).   Time 4   Period Weeks   Status On-going  04/13/17:  last fall in shower 04/04/17           OT Long Term Goals - 04/13/17 0822      OT LONG TERM GOAL #1   Title Pt will be independent with updated HEP.--check LTGs 05/15/17   Time 8   Period Weeks   Status New     OT LONG TERM GOAL #2   Title Pt  will perform simple cooking/home maintenance tasks mod I.   Time 8   Period Weeks   Status New     OT LONG TERM GOAL #3   Title Pt will improve coordination for ADLs as shown by improving time on 9-hole peg test by at least 20sec with RUE.--revised 04/13/17:  completing test in 28sec or less   Baseline R-57.19sec   Time 8   Period Weeks   Status Revised  04/13/17:  met initial goal and revised goal (35.94sec)     OT LONG TERM GOAL #4   Title Pt will perform environmental scanning with at least 90% accuracy for incr safety in the community.   Time 8   Period Weeks   Status New     OT LONG TERM GOAL #5   Title Pt will improve R grip strength by at least 15lbs to assist with ADLs.   Baseline R-18lbs   Time 8   Period Weeks   Status New     OT LONG TERM GOAL #6   Title Pt will be able to retrieve 3lb object from overhead shelf x3 safely with RUE.   Time 8   Period Weeks   Status New               Plan - 04/13/17 6270    Clinical Impression Statement Pt progressing towards goals with improving strength and coordination of RUE.  However, decr sensation and cognitive deficits continue to affect balance and pt is continuing to experience falls.  Pt also with decr visual scanning and attention.   Rehab Potential Good   Current Impairments/barriers affecting progress: decr sensation, fall risk   OT Frequency 2x / week   OT Duration 8 weeks   OT Treatment/Interventions Self-care/ADL training;Moist Heat;Fluidtherapy;DME and/or AE instruction;Splinting;Patient/family education;Balance training;Therapeutic exercises;Contrast Bath;Ultrasound;Therapeutic exercise;Therapeutic activities;Cognitive remediation/compensation;Passive range of motion;Functional Mobility Training;Neuromuscular education;Cryotherapy;Electrical Stimulation;Parrafin;Energy conservation;Manual Therapy;Visual/perceptual remediation/compensation   Plan G-code next session, environmental scanning, simple cooking task    OT Home Exercise Plan Education provided:  03/28/17 coordination HEP, red putty HEP , red theraband issued 03/30/17   Consulted and Agree with Plan of Care Patient      Patient will benefit from skilled therapeutic intervention in order to improve the following deficits and impairments:  Decreased coordination, Impaired sensation, Decreased range of motion, Decreased safety awareness, Decreased activity tolerance, Decreased balance, Decreased knowledge of use of DME, Impaired UE  functional use, Impaired vision/preception, Impaired perceived functional ability, Decreased strength, Decreased mobility, Decreased cognition  Visit Diagnosis: Hemiplegia and hemiparesis following cerebral infarction affecting right non-dominant side (HCC)  Visuospatial deficit  Other lack of coordination  Unsteadiness on feet  Other abnormalities of gait and mobility  Unspecified symptoms and signs involving cognitive functions following cerebral infarction  Attention and concentration deficit  Other disturbances of skin sensation    Problem List Patient Active Problem List   Diagnosis Date Noted  . Hemiparesis affecting right side as late effect of cerebrovascular accident ([place]) 03/23/2017  . Lacunar infarct, acute ([place]) 03/23/2017  . Visual disturbance as complication of stroke 76/15/1834  . Adjustment disorder with anxious mood   . 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 Sickle cell trait ([place])   . Hb-SS disease with crisis ([place])   . Hyperlipidemia   . Thyroid nodule   . Changes in vision   . Acute ischemic stroke ([place])   . Dysphagia, post-stroke   . Benign essential HTN   . Polycystic kidney disease   . Prediabetes   . Acute intractable headache   . Leukocytosis   . Stroke (cerebrum) ([place]) 02/25/2017    [facility] 04/13/2017, 11:13 AM  [facility] [address] [place], [place],  [address] Phone: [phone]   Fax:  [phone]  Name: CAITLYNN JU MRN: [id number] Date of Birth: 1975-12-27   Vianne Bulls, OTR/L [facility] [address] [place], [place]  [address] [phone] phone [phone] 04/13/17 11:13 AM

## 2017-04-13 NOTE — Therapy (Signed)
[facility] [address] [place], [place], [address] Phone: [phone]   Fax:  [phone]  Speech Language Pathology Treatment  Patient Details  Name: Sharon Chan MRN: [id number] Date of Birth: February 02, 1976 No Data Recorded  Encounter Date: 04/13/2017      End of Session - 04/13/17 1005    Visit Number 3   Number of Visits 17   Date for SLP Re-Evaluation 06/02/17   SLP Start Time 0847   SLP Stop Time  0930   SLP Time Calculation (min) 43 min      Past Medical History:  Diagnosis Date  . CVA (cerebral vascular accident) (HCC)   . Polycystic disease, ovaries   . Seizures (HCC)   . Sickle cell trait [facility])     Past Surgical History:  Procedure Laterality Date  . LAPAROSCOPIC CHOLECYSTECTOMY      There were no vitals filed for this visit.      Subjective Assessment - 04/13/17 0853    Subjective "(The speech) is getting better."   Currently in Pain? Yes   Pain Score 10-Worst pain ever   Pain Location Head   Pain Orientation Right   Pain Descriptors / Indicators Headache   Pain Type Acute pain   Pain Onset 1 to 4 weeks ago   Pain Frequency Constant   Aggravating Factors  light stress, loud noise   Pain Relieving Factors meds, essential oils, dark rooms               ADULT SLP TREATMENT - 04/13/17 0858      General Information   Behavior/Cognition Alert;Pleasant mood;Cooperative     Treatment Provided   Treatment provided Cognitive-Linquistic     Cognitive-Linquistic Treatment   Treatment focused on Aphasia   Skilled Treatment In structured tasks (specific rhyming word puzzles) today pt with rare min A needed for generation of synonym words. In conversation of 15 minutes re: pt's pain, feelings about talking to strangers post-CVA pt had no overt anomic errors. SLP encouraged pt with this and sugested she talk with "bridge" people to slowly work her way back to being more comfortable  talking with strangers like she used to do premorbidly.      Assessment / Recommendations / Plan   Plan Continue with current plan of care     Progression Toward Goals   Progression toward goals Progressing toward goals          SLP Education - 04/13/17 1004    Education provided Yes   Education Details how to work on pt's comfort in again talking with strangers    Person(s) Educated Patient   Methods Explanation   Comprehension Verbalized understanding          SLP Short Term Goals - 04/13/17 1007      SLP SHORT TERM GOAL #1   Title Pt will tell four ways to compensate for anomia   Time 3   Period Weeks   Status On-going     SLP SHORT TERM GOAL #2   Title pt will complete mod complex naming tasks independently with 90% success over three sessions   Time 3   Period Weeks   Status On-going     SLP SHORT TERM GOAL #3   Title pt will engage functionally in 10 minutes mod complex/complex conversation using compensatory strategies PRN over three sessions   Time 3   Period Weeks   Status On-going  SLP Long Term Goals - 04/13/17 1007      SLP LONG TERM GOAL #1   Title pt will engage functionally in 25 minutes mostly complex conversation outside ST room, using compensatory strategies PRN over three sessions   Time 7   Period Weeks   Status On-going          Plan - 04/13/17 1005    Clinical Impression Statement Pt presents today with word finding deficts in focused word finding tasks, but nothing overt in 15 minutes mod complex conversation. Pt confided in SLP that she is now more afraid of talkign with strangers due to her lingering anomia, and did not have this fear premorbidly. SLP suggested some specific tasks she could do to gradually work her way back to talking with strangers. Skilled ST is recommended for improving her verbal expression as close to PLOF as possible.    Speech Therapy Frequency 2x / week   Duration --  8 weeks    Treatment/Interventions Language facilitation;Compensatory techniques;Internal/external aids;SLP instruction and feedback;Multimodal communcation approach;Patient/family education;Functional tasks   Potential to Achieve Goals Good      Patient will benefit from skilled therapeutic intervention in order to improve the following deficits and impairments:   Aphasia    Problem List Patient Active Problem List   Diagnosis Date Noted  . Hemiparesis affecting right side as late effect of cerebrovascular accident (HCC) 03/23/2017  . Lacunar infarct, acute (HCC) 03/23/2017  . Visual disturbance as complication of stroke 03/07/2017  . Adjustment disorder with anxious mood   . 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 Sickle cell trait (HCC)   . Hb-SS disease with crisis (HCC)   . Hyperlipidemia   . Thyroid nodule   . Changes in vision   . Acute ischemic stroke (HCC)   . Dysphagia, post-stroke   . Benign essential HTN   . Polycystic kidney disease   . Prediabetes   . Acute intractable headache   . Leukocytosis   . Stroke (cerebrum) (HCC) 02/25/2017    Magaby Rumberger ,MS, [name]  04/13/2017, 10:08 AM  [facility] [address] [place], [place], [address] Phone: [phone]   Fax:  [phone]   Name: Sharon Chan MRN: [id number] Date of Birth: 05/22/1976

## 2017-04-13 NOTE — Patient Instructions (Signed)
  Please complete the assigned speech therapy homework prior to your next session and return it to the speech therapist at your next visit.  

## 2017-04-14 ENCOUNTER — Ambulatory Visit: Payer: Medicare Other | Admitting: Occupational Therapy

## 2017-04-14 ENCOUNTER — Encounter: Payer: Self-pay | Admitting: Physical Therapy

## 2017-04-14 ENCOUNTER — Ambulatory Visit: Payer: Medicare Other | Attending: Physical Medicine & Rehabilitation | Admitting: Physical Therapy

## 2017-04-14 ENCOUNTER — Encounter: Payer: Self-pay | Admitting: Family Medicine

## 2017-04-14 ENCOUNTER — Ambulatory Visit (INDEPENDENT_AMBULATORY_CARE_PROVIDER_SITE_OTHER): Payer: Medicare Other | Admitting: Family Medicine

## 2017-04-14 VITALS — BP 132/90 | HR 80 | Temp 98.6°F | Resp 16 | Ht 63.0 in | Wt 210.0 lb

## 2017-04-14 DIAGNOSIS — E042 Nontoxic multinodular goiter: Secondary | ICD-10-CM

## 2017-04-14 DIAGNOSIS — R208 Other disturbances of skin sensation: Secondary | ICD-10-CM | POA: Diagnosis not present

## 2017-04-14 DIAGNOSIS — I69319 Unspecified symptoms and signs involving cognitive functions following cerebral infarction: Secondary | ICD-10-CM | POA: Insufficient documentation

## 2017-04-14 DIAGNOSIS — R2689 Other abnormalities of gait and mobility: Secondary | ICD-10-CM | POA: Insufficient documentation

## 2017-04-14 DIAGNOSIS — I10 Essential (primary) hypertension: Secondary | ICD-10-CM

## 2017-04-14 DIAGNOSIS — I69391 Dysphagia following cerebral infarction: Secondary | ICD-10-CM

## 2017-04-14 DIAGNOSIS — I639 Cerebral infarction, unspecified: Secondary | ICD-10-CM

## 2017-04-14 DIAGNOSIS — I69353 Hemiplegia and hemiparesis following cerebral infarction affecting right non-dominant side: Secondary | ICD-10-CM | POA: Diagnosis not present

## 2017-04-14 DIAGNOSIS — R41842 Visuospatial deficit: Secondary | ICD-10-CM | POA: Insufficient documentation

## 2017-04-14 DIAGNOSIS — R41840 Attention and concentration deficit: Secondary | ICD-10-CM

## 2017-04-14 DIAGNOSIS — R2681 Unsteadiness on feet: Secondary | ICD-10-CM | POA: Diagnosis not present

## 2017-04-14 DIAGNOSIS — R278 Other lack of coordination: Secondary | ICD-10-CM | POA: Diagnosis not present

## 2017-04-14 DIAGNOSIS — R4701 Aphasia: Secondary | ICD-10-CM | POA: Insufficient documentation

## 2017-04-14 DIAGNOSIS — I63411 Cerebral infarction due to embolism of right middle cerebral artery: Secondary | ICD-10-CM | POA: Diagnosis not present

## 2017-04-14 DIAGNOSIS — M6281 Muscle weakness (generalized): Secondary | ICD-10-CM | POA: Insufficient documentation

## 2017-04-14 MED ORDER — ATORVASTATIN CALCIUM 40 MG PO TABS: 40.0000 mg | ORAL_TABLET | Freq: Every day | ORAL | 0 refills | Status: DC

## 2017-04-14 MED ORDER — VITAMIN B-12 100 MCG PO TABS: 100.0000 ug | ORAL_TABLET | Freq: Every day | ORAL | 0 refills | Status: DC

## 2017-04-14 NOTE — Progress Notes (Signed)
Patient ID: Sharon Chan, female    DOB: [birth year], 41 y.o.   MRN: [id number]  PCP: Bing Neighbors, FNP  Chief Complaint  Patient presents with  . Cerebrovascular Accident    follow-up  . Thyroid Nodule    repeat thyroid test  . Hypertension    follow-up      Subjective:  HPI  Sharon Chan is a 41 y.o. female presents for a 4 week follow-up.  Chronic medical problems include: Recent embolic stroke,sickle cell trait disease w/ pain syndrome, PCOS, prediabetes, thyroid nodule, hyperlipidemia.  Thyroid Nodule Confirmed by ultrasound of neck. Reports fatigue, however she is s/p CVA. Prior thyroid panel within normal range. Repeating TSH panel and Sharon Chan is in agreement for an evaluation of nodule to occur by endocrinology.   Hypertension  Monitors blood pressure at home daily. Readings since starting Lisinopril, have consistently remained In the upper 120's 130's /80's. She denies any associated dizziness, headaches, coughing or feeling faint since starting medication.   CVA Continues to attend rehabilitation weekly. Reports weekly, she is progressing. Continues to struggle with right sided weakness impairing peripheral vision, speech, and swallowing. She is able to eat food without aspirating, although when she is lying down she experiences a sensation as if she is choking. Continues to work with speech therapy at rehab to improve dysphagia. Since CVA, mood continues to be liable. She fears crowds due to slowed speech. Refrains from engaging socially. Cries more often than in the past. Her mother is living with her and she has great family support and faith, which she attributes to her ability to cope. Sharon Chan requests, a referral to [facility] due to preference. She has an appointment scheduled with with [facility] in August, but prefers to change practices.   Social History   Social History  . Marital status: Married    Spouse name:  N/A  . Number of children: 0  . Years of education: N/A   Occupational History  . Not on file.   Social History Main Topics  . Smoking status: Never Smoker  . Smokeless tobacco: Never Used  . Alcohol use No  . Drug use: Unknown  . Sexual activity: Not on file   Other Topics Concern  . Not on file   Social History Narrative   Lives at home, mom currently staying with her   Left-handed   Caffeine: occasional decaf coffee or raspberry tea    Family History  Problem Relation Age of Onset  . Heart attack Mother   . Post-traumatic stress disorder Father        committed suicide  . Diabetes Father   . Hypertension Sister    Review of Systems See HPI Patient Active Problem List   Diagnosis Date Noted  . Hemiparesis affecting right side as late effect of cerebrovascular accident (HCC) 03/23/2017  . Lacunar infarct, acute (HCC) 03/23/2017  . Visual disturbance as complication of stroke 03/07/2017  . Adjustment disorder with anxious mood   . 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 Sickle cell trait (HCC)   . Hb-SS disease with crisis (HCC)   . Hyperlipidemia   . Thyroid nodule   . Changes in vision   . Acute ischemic stroke (HCC)   . Dysphagia, post-stroke   . Benign essential HTN   . Polycystic kidney disease   . Prediabetes   . Acute intractable headache   . Leukocytosis   . Stroke (cerebrum) (HCC)  02/25/2017    No Known Allergies  Prior to Admission medications   Medication Sig Start Date End Date Taking? Authorizing Provider  acetaminophen (TYLENOL) 325 MG tablet Take 1-2 tablets (325-650 mg total) by mouth every 4 (four) hours as needed for mild pain. 03/09/17  Yes Love, Evlyn Kanner, PA-C  aspirin 325 MG tablet Take 1 tablet (325 mg total) by mouth daily. 03/10/17  Yes Love, Evlyn Kanner, PA-C  atorvastatin (LIPITOR) 40 MG tablet Take 1 tablet (40 mg total) by mouth daily at 6 PM. 03/10/17  Yes Love, Evlyn Kanner, PA-C   butalbital-acetaminophen-caffeine (FIORICET WITH CODEINE) [phone] MG capsule Take 1 capsule by mouth every 4 (four) hours as needed for headache. 03/17/17  Yes Bing Neighbors, FNP  cyclobenzaprine (FLEXERIL) 5 MG tablet Take 1 tablet (5 mg total) by mouth 3 (three) times daily as needed for muscle spasms. Shoulder/neck pain 03/10/17  Yes Love, Evlyn Kanner, PA-C  gabapentin (NEURONTIN) 300 MG capsule Take 1 tablet in the morning and 2 tablets at bedtime daily 03/17/17  Yes Bing Neighbors, FNP  lisinopril (PRINIVIL,ZESTRIL) 10 MG tablet Take 1 tablet (10 mg total) by mouth daily. 03/17/17  Yes Bing Neighbors, FNP  MAGNESIUM PO Take 1 tablet by mouth at bedtime.   Yes [provider]  metFORMIN (GLUCOPHAGE) 500 MG tablet Take 1 tablet (500 mg total) by mouth 2 (two) times daily with a meal. 03/17/17  Yes Bing Neighbors, FNP  naphazoline-glycerin (CLEAR EYES) 0.012-0.2 % SOLN Place 1-2 drops into both eyes 4 (four) times daily -  with meals and at bedtime. 03/10/17  Yes Love, Evlyn Kanner, PA-C  senna-docusate (SENOKOT-S) 8.6-50 MG tablet Take 2 tablets by mouth at bedtime. 03/10/17  Yes Love, Evlyn Kanner, PA-C  topiramate (TOPAMAX) 100 MG tablet Take 1 tablet (100 mg total) by mouth 2 (two) times daily. Take 100mg  in am and 200mg  at night 03/23/17 03/23/18 Yes Micki Riley, MD  traMADol (ULTRAM) 50 MG tablet Take 1-2 tablets (50-100 mg total) by mouth every 6 (six) hours as needed for moderate pain. 03/10/17  Yes Love, Evlyn Kanner, PA-C  vitamin B-12 (CYANOCOBALAMIN) 100 MCG tablet Take 1 tablet (100 mcg total) by mouth daily. 03/10/17  Yes Love, Evlyn Kanner, PA-C    Past Medical, Surgical Family and Social History reviewed and updated.    Objective:   Today's Vitals   04/14/17 1453  BP: 132/90  Pulse: 80  Resp: 16  Temp: 98.6 F (37 C)  TempSrc: Oral  SpO2: 100%  Weight: 210 lb (95.3 kg)  Height: 5\' 3"  (1.6 m)    Wt Readings from Last 3 Encounters:  04/14/17 210 lb (95.3 kg)   03/27/17 219 lb 14.4 oz (99.7 kg)  03/23/17 218 lb (98.9 kg)   Physical Exam Constitutional: She appears well-developed and well-nourished.  HENT:  Head: Normocephalic and atraumatic.  Right Ear: External ear normal.  Left Ear: External ear normal.  Nose: Nose normal.  Mouth/Throat: Oropharynx is clear and moist.  Eyes:  Sluggish pupil reaction  Neck: Normal range of motion. Neck supple.  Neck fullness   Cardiovascular: Normal rate, regular rhythm, normal heart sounds and intact distal pulses.   Pulmonary/Chest: Effort normal and breath sounds normal.  Neurological: She is alert. She displays tremor. A sensory deficit is present. No cranial nerve deficit. Coordination and gait  abnormal although improved from prior encounter. GCS eye subscore is 4. GCS verbal subscore is 5. GCS motor subscore is 6.  Skin:  Skin is warm and dry.  Psychiatric: She has a normal mood and affect. Her behavior is normal. Judgment and thought content normal.    Assessment & Plan:  1. Multiple thyroid nodules -Thyroid Panel With TSH -Prior referral submitted for Endocrinology  2. 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 Karel JarvisAquino  -Annabella advised that if she desires a personal attendant to assist with in home-needs to notify me and I will  submit paperwork to [facility].  3. Dysphagia, post-stroke -[facility]  4. Hypertension -Continue Lisinopril 10 mg daily.  RTC: 3 months for hypertension and prediabetes follow-up  Godfrey PickKimberly S. Tiburcio PeaHarris, MSN, FNP-C [facility]  [address] [place], [place] [address] [phone]

## 2017-04-14 NOTE — Patient Instructions (Addendum)
Continue all medication as prescribed. Call the office if you need refills.  We will get you worked in with [facility] and contact you once this has been completed.  We will notify you of the results of your recent lab work.  Great to see you!   Godfrey PickKimberly S. Tiburcio PeaHarris, MSN, FNP-C [facility]  [address] [place], [place] [address] [phone]

## 2017-04-14 NOTE — Therapy (Signed)
[facility] [address] [place], [place], [address] Phone: [phone]   Fax:  [phone]  Occupational Therapy Treatment  Patient Details  Name: Sharon Chan MRN: [id number] Date of Birth: 11/05/1976 Referring Provider: Dr. Andrew Kirsteins  Encounter Date: 04/14/2017      OT End of Session - 04/14/17 0918    Visit Number 10   Number of Visits 21   Date for OT Re-Evaluation 05/15/17   Authorization Type Medicare/Medicaid; g-code needed   Authorization - Visit Number 10   Authorization - Number of Visits 10   OT Start Time 0851   OT Stop Time 0930   OT Time Calculation (min) 39 min   Activity Tolerance Patient tolerated treatment well   Behavior During Therapy WFL for tasks assessed/performed      Past Medical History:  Diagnosis Date  . CVA (cerebral vascular accident) (HCC)   . Polycystic disease, ovaries   . Seizures (HCC)   . Sickle cell trait (HCC)     Past Surgical History:  Procedure Laterality Date  . LAPAROSCOPIC CHOLECYSTECTOMY      There were no vitals filed for this visit.      Subjective Assessment - 04/14/17 0920    Pertinent History L CVA 02/25/17; HTN, polycystic kidney disease, hyperlipidemia, sickle cell trait, scans showed 2 old CVAs, sickle cell trait, prediabetes    Limitations no driving, fall risk, visual deficits   Currently in Pain? Yes   Pain Score 10-Worst pain ever   Pain Location Generalized   Pain Type Acute pain   Pain Onset In the past 7 days   Pain Frequency Constant   Aggravating Factors  unknown   Pain Relieving Factors darkness            Treatment:Simple cooking task to scramble an egg. Pt gathered items with supervison and min v.c for technique. Pt was able to safely cook the egg using both hands and min v.c for technique. Pt turned off the stove without prompting. Pt was able to clean up items by placing in dishwasher and pt pt wiped down  countertop without LOB. Seated and standing to perform functional reach to place graded clothespins on vertical antennae, min difficulty, v.c for positioning of hand.                     OT Short Term Goals - 04/13/17 0809      OT SHORT TERM GOAL #1   Title Pt will be independent with initial HEP.--check STGs 04/15/17   Time 4   Period Weeks   Status Achieved  04/06/17  met     OT SHORT TERM GOAL #2   Title Pt will improve R hand coordination/functional reaching as shown by improving score on box and blocks test by at least 10.   Baseline R-28blocks, L-52blocks   Time 4   Period Weeks   Status Achieved  04/13/17:  38 blocks     OT SHORT TERM GOAL #3   Title Pt will improve R hand coordination for ADLs as shown by improving time on 9-hole peg test by at least 10sec.   Baseline R-57.19sec   Time 4   Period Weeks   Status Achieved  04/13/17:  35.94sec     OT SHORT TERM GOAL #4   Title Pt will perform tabletop visual scanning with at least 95% accuracy with incr time.   Time 4   Period Weeks   Status Not   Met  04/13/17:  inconsistent approx 78% with reading guide     OT SHORT TERM GOAL #5   Title Pt will verbalize understanding of memory/visual compensation strategies.   Time 4   Period Weeks   Status Achieved  04/06/17 met     OT SHORT TERM GOAL #6   Title Pt will improve R grip strength by at least 8lbs to assist with ADLs.   Baseline R-18lbs   Time 4   Period Weeks   Status Achieved  04/13/17:  28lbs     OT SHORT TERM GOAL #7   Title Pt will perform dressing/bathing without falls x 2 weeks utilizing safety strategies (supervision).   Time 4   Period Weeks   Status On-going  04/13/17:  last fall in shower 04/04/17           OT Long Term Goals - 04/13/17 0822      OT LONG TERM GOAL #1   Title Pt will be independent with updated HEP.--check LTGs 05/15/17   Time 8   Period Weeks   Status New     OT LONG TERM GOAL #2   Title Pt will perform simple  cooking/home maintenance tasks mod I.   Time 8   Period Weeks   Status New     OT LONG TERM GOAL #3   Title Pt will improve coordination for ADLs as shown by improving time on 9-hole peg test by at least 20sec with RUE.--revised 04/13/17:  completing test in 28sec or less   Baseline R-57.19sec   Time 8   Period Weeks   Status Revised  04/13/17:  met initial goal and revised goal (35.94sec)     OT LONG TERM GOAL #4   Title Pt will perform environmental scanning with at least 90% accuracy for incr safety in the community.   Time 8   Period Weeks   Status New     OT LONG TERM GOAL #5   Title Pt will improve R grip strength by at least 15lbs to assist with ADLs.   Baseline R-18lbs   Time 8   Period Weeks   Status New     OT LONG TERM GOAL #6   Title Pt will be able to retrieve 3lb object from overhead shelf x3 safely with RUE.   Time 8   Period Weeks   Status New               Plan - 04/14/17 1615    Clinical Impression Statement Pt demonstrates good overall progress towards goals with improved strength and coordination. Pt remains limited by decreased sensation, impaired balance, and cognitive deficts which impact pt's safety and indpendence with ADLS/IADLs. Pt can benefit from continued skilled occupational therapy to address these deficits and maximize pt's functional independence.   Rehab Potential Good   Current Impairments/barriers affecting progress: decr sensation, fall risk   OT Frequency 2x / week   OT Duration 8 weeks   OT Treatment/Interventions Self-care/ADL training;Moist Heat;Fluidtherapy;DME and/or AE instruction;Splinting;Patient/family education;Balance training;Therapeutic exercises;Contrast Bath;Ultrasound;Therapeutic exercise;Therapeutic activities;Cognitive remediation/compensation;Passive range of motion;Functional Mobility Training;Neuromuscular education;Cryotherapy;Electrical Stimulation;Parrafin;Energy conservation;Manual Therapy;Visual/perceptual  remediation/compensation   Plan UE functional use, dynamic balance   OT Home Exercise Plan Education provided:  03/28/17 coordination HEP, red putty HEP , red theraband issued 03/30/17   Consulted and Agree with Plan of Care Patient      Patient will benefit from skilled therapeutic intervention in order to improve the following deficits and impairments:  Decreased coordination, Impaired   sensation, Decreased range of motion, Decreased safety awareness, Decreased activity tolerance, Decreased balance, Decreased knowledge of use of DME, Impaired UE functional use, Impaired vision/preception, Impaired perceived functional ability, Decreased strength, Decreased mobility, Decreased cognition  Visit Diagnosis: Hemiplegia and hemiparesis following cerebral infarction affecting right non-dominant side (HCC)  Visuospatial deficit  Other lack of coordination  Attention and concentration deficit  Other disturbances of skin sensation      G-Codes - 16-Apr-2017 1610    Functional Assessment Tool Used (Outpatient only) 9-hole peg test R-35.94 secs.  Box and blocks test:RUE  38 blocks.  R grip strength 28lbs   Functional Limitation Carrying, moving and handling objects   Carrying, Moving and Handling Objects Current Status [phone]) At least 40 percent but less than 60 percent impaired, limited or restricted   Carrying, Moving and Handling Objects Goal Status (Z2248) At least 20 percent but less than 40 percent impaired, limited or restricted     Occupational Therapy Progress Note  Dates of Reporting Period: 03/16/17 to 2017-04-16  Objective Reports of Subjective Statement: see above  Objective Measurements: see above  Goal Update:  Pt demonstrates good overall progress towards remaining goals.  Plan: Continue skilled OT  Reason Skilled Services are Required: see above Problem List Patient Active Problem List   Diagnosis Date Noted  . Hemiparesis affecting right side as late effect of  cerebrovascular accident ([place]) 03/23/2017  . Lacunar infarct, acute ([place]) 03/23/2017  . Visual disturbance as complication of stroke 25/00/3704  . Adjustment disorder with anxious mood   . 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 Sickle cell trait ([place])   . Hyperlipidemia   . Thyroid nodule   . Changes in vision   . Acute ischemic stroke ([place])   . Dysphagia, post-stroke   . Benign essential HTN   . Polycystic kidney disease   . Prediabetes   . Acute intractable headache   . Leukocytosis   . Stroke (cerebrum) ([place]) 02/25/2017    Shelisa Fern 16-Apr-2017, 4:19 PM Theone Murdoch, OTR/L [phone] Phone: [phone] 4:21 PM 04-16-2017 [facility] [address] [place], [place], [address] Phone: [phone]   Fax:  [phone]  Name: ALYSAH CARTON MRN: [id number] Date of Birth: [birth year]

## 2017-04-15 LAB — THYROID PANEL WITH TSH
Free Thyroxine Index: 2.4 (ref 1.4–3.8)
T3 Uptake: 31 % (ref 22–35)
T4, Total: 7.8 ug/dL (ref 4.5–12.0)
TSH: 0.22 mIU/L — ABNORMAL LOW

## 2017-04-16 NOTE — Therapy (Signed)
[facility] [address] [place], [place], [address] Phone: [phone]   Fax:  [phone]  Physical Therapy Treatment  Patient Details  Name: Sharon Chan MRN: [id number] Date of Birth: 11-14-1976 Referring Provider: Alysia Penna, MD  Encounter Date: 04/14/2017   04/14/17 0938  PT Visits / Re-Eval  Visit Number 9  Number of Visits 18  Date for PT Re-Evaluation 05/15/17  Authorization  Authorization Type G code every 10th visit  PT Time Calculation  PT Start Time 0933  PT Stop Time 1015  PT Time Calculation (min) 42 min  PT - End of Session  Equipment Utilized During Treatment Gait belt  Activity Tolerance Patient tolerated treatment well  Behavior During Therapy [facility] for tasks assessed/performed     Past Medical History:  Diagnosis Date  . CVA (cerebral vascular accident) ([place])   . Polycystic disease, ovaries   . Seizures ([place])   . Sickle cell trait [facility])     Past Surgical History:  Procedure Laterality Date  . LAPAROSCOPIC CHOLECYSTECTOMY      There were no vitals filed for this visit.   04/14/17 0935  Symptoms/Limitations  Subjective No new complaints. No falls to report. Having pain all over, mostly the left side. See her sickle cell MD today (this pm). Continues to have headaches as well.   Pertinent History sickle cell, polycystic ovarian disease, obesity, thyroid nodule  Patient Stated Goals "To get back to a sense of normalcy", improve sensation in her right side and be independent.  Pain Assessment  Pain Score 10  Pain Location Generalized  Pain Descriptors / Indicators Other (Comment) (sickle cell pain)  Pain Type Acute pain  Pain Onset In the past 7 days  Pain Frequency Constant  Aggravating Factors  sickle cell disease  Pain Relieving Factors rest         04/14/17 0939  Transfers  Transfers Sit to Stand;Stand to Sit  Sit to Stand 6: Modified independent  (Device/Increase time);With upper extremity assist;From bed;From chair/3-in-1  Stand to Sit 6: Modified independent (Device/Increase time);With upper extremity assist;To bed;To chair/3-in-1  Ambulation/Gait  Ambulation/Gait Yes  Ambulation/Gait Assistance 5: Supervision  Ambulation/Gait Assistance Details cues on step length, posture while scanning enviroment  Ambulation Distance (Feet) 450 Feet  Assistive device Straight cane  Gait Pattern Step-through pattern;Decreased stride length;Decreased stance time - right;Decreased step length - left;Decreased dorsiflexion - right  Ambulation Surface Level;Indoor  Gait velocity 13.75 sec's= 2.4 ft/sec with cane  Neuro Re-ed   Neuro Re-ed Details  2 tall cones on blue mat next to counter top: alternating fwd toe taps x 10 reps each leg, alternating cross toe taps x 10 reps each leg, alternating fwd double toe taps x 10 reps each leg,   Exercises  Other Exercises  pt performed all exercises (for strengthening and balacne) in session today with minimal cues needed on correct ex form/technique.           PT Short Term Goals - 04/14/17 8366      PT SHORT TERM GOAL #1   Title The patient will return demo HEP for R LE strength, dynamic balance activities.   Baseline 04/14/17: met today with current HEP   Time --   Period --     PT SHORT TERM GOAL #2   Title The patient will improve gait speed from 2 ft/sec to > or equal to 2.4 ft/sec to demo increasing functional mobility.   Baseline 04/14/17: 2.4 ft/sec with cane  today   Time --   Period --     PT SHORT TERM GOAL #3   Title The patient will improve FGA from 8/30 to > or equal to 12/30 to demo improving dynamic balance/gait.   Baseline scored 13/30 on FGA on 04/12/2017   Time --   Period --   Status Achieved     PT SHORT TERM GOAL #4   Title The patient will return demo stair negotiation with one handrail, reciprocal pattern, modified indep.   Baseline 02/10/17: The patient is able to perform  this mod indep, however expresses fear of falling and has slowed pace to perform.   Time --   Period --   Status Achieved           PT Long Term Goals - 03/17/17 2458      PT LONG TERM GOAL #1   Title The patient will be indep with progression of HEP.   Baseline Target date 05/15/2017   Time 8   Period Weeks     PT LONG TERM GOAL #2   Title The patient will improve Gait speed from 2 ft/sec to > or equal to 2.8 ft/sec to demo return to "full community ambulator" classification of gait.   Baseline Target date 05/15/2017   Time 8   Period Weeks     PT LONG TERM GOAL #3   Title The patient will improve FGA from 8/30 up to 15/30 to demo improving dynamic balance activities.   Baseline Target date 05/15/2017   Time 8   Period Weeks     PT LONG TERM GOAL #4   Title Improve neuro QOL LE from 44.4% up to 60% to demo improving self perception of disability.   Baseline Target date 05/15/2017   Time 8   Period Weeks     PT LONG TERM GOAL #5   Title The patient will improve R LE strength to 5/5.   Baseline Target date 05/15/2017   Time 8   Period Weeks        04/14/17 0998  Plan  Clinical Impression Statement Pt met remaining 2 STGs today. Remainder of skilled session focused on balance with no issues reported. Pt is progressing toward goals and should benefit from continued PT to progress toward unmet goals.    Pt will benefit from skilled therapeutic intervention in order to improve on the following deficits Difficulty walking;Decreased activity tolerance;Decreased balance;Decreased mobility;Decreased strength;Impaired sensation  PT Treatment/Interventions ADLs/Self Care Home Management;Therapeutic activities;Therapeutic exercise;Balance training;Neuromuscular re-education;Gait training;Stair training;Patient/family education;Functional mobility training;Orthotic Fit/Training  PT Next Visit Plan g-code next visit; continue multi-tasking, stair negotiation with reciprocal pattern, balance/  coordination, obstacle negotiation/cognitive tasks.  Consulted and Agree with Plan of Care Patient     Patient will benefit from skilled therapeutic intervention in order to improve the following deficits and impairments:  Difficulty walking, Decreased activity tolerance, Decreased balance, Decreased mobility, Decreased strength, Impaired sensation  Visit Diagnosis: Hemiplegia and hemiparesis following cerebral infarction affecting right non-dominant side (HCC)  Unsteadiness on feet  Other abnormalities of gait and mobility     Problem List Patient Active Problem List   Diagnosis Date Noted  . Hemiparesis affecting right side as late effect of cerebrovascular accident ([place]) 03/23/2017  . Lacunar infarct, acute ([place]) 03/23/2017  . Visual disturbance as complication of stroke 33/82/5053  . Adjustment disorder with anxious mood   . 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 Sickle cell trait ([place])   .  Hyperlipidemia   . Thyroid nodule   . Changes in vision   . Acute ischemic stroke ([place])   . Dysphagia, post-stroke   . Benign essential HTN   . Polycystic kidney disease   . Prediabetes   . Acute intractable headache   . Leukocytosis   . Stroke (cerebrum) ([place]) 02/25/2017    Willow Ora, PTA, [facility] [address], [place], [place] [address] [phone] 04/16/17, 8:20 PM   Name: Sharon Chan MRN: [id number] Date of Birth: 01-Jan-1976

## 2017-04-17 ENCOUNTER — Ambulatory Visit: Payer: Medicare Other | Admitting: Occupational Therapy

## 2017-04-17 ENCOUNTER — Ambulatory Visit: Payer: Medicare Other | Admitting: Rehabilitative and Restorative Service Providers"

## 2017-04-17 ENCOUNTER — Ambulatory Visit: Payer: Medicare Other | Admitting: Speech Pathology

## 2017-04-17 DIAGNOSIS — R208 Other disturbances of skin sensation: Secondary | ICD-10-CM

## 2017-04-17 DIAGNOSIS — R2681 Unsteadiness on feet: Secondary | ICD-10-CM

## 2017-04-17 DIAGNOSIS — R2689 Other abnormalities of gait and mobility: Secondary | ICD-10-CM

## 2017-04-17 DIAGNOSIS — I69353 Hemiplegia and hemiparesis following cerebral infarction affecting right non-dominant side: Secondary | ICD-10-CM | POA: Diagnosis not present

## 2017-04-17 DIAGNOSIS — M6281 Muscle weakness (generalized): Secondary | ICD-10-CM

## 2017-04-17 DIAGNOSIS — R4701 Aphasia: Secondary | ICD-10-CM

## 2017-04-17 DIAGNOSIS — I69319 Unspecified symptoms and signs involving cognitive functions following cerebral infarction: Secondary | ICD-10-CM

## 2017-04-17 DIAGNOSIS — R41842 Visuospatial deficit: Secondary | ICD-10-CM | POA: Diagnosis not present

## 2017-04-17 DIAGNOSIS — R41840 Attention and concentration deficit: Secondary | ICD-10-CM

## 2017-04-17 DIAGNOSIS — R278 Other lack of coordination: Secondary | ICD-10-CM

## 2017-04-17 NOTE — Therapy (Signed)
[facility] [address] [place], [place], [address] Phone: [phone]   Fax:  [phone]  Occupational Therapy Treatment  Patient Details  Name: Sharon Chan MRN: [id number] Date of Birth: 28-Jul-1976 Referring Provider: Dr. Alysia Penna  Encounter Date: 04/17/2017      OT End of Session - 04/17/17 1028    Visit Number 11   Number of Visits 21   Date for OT Re-Evaluation 05/15/17   Authorization Type Medicare/Medicaid; g-code needed   Authorization - Visit Number 11   Authorization - Number of Visits 20   OT Start Time 1021   OT Stop Time 1100   OT Time Calculation (min) 39 min   Activity Tolerance Patient tolerated treatment well   Behavior During Therapy [facility] for tasks assessed/performed      Past Medical History:  Diagnosis Date  . CVA (cerebral vascular accident) ([place])   . Polycystic disease, ovaries   . Seizures ([place])   . Sickle cell trait [facility])     Past Surgical History:  Procedure Laterality Date  . LAPAROSCOPIC CHOLECYSTECTOMY      There were no vitals filed for this visit.      Subjective Assessment - 04/17/17 1022    Subjective  Pt reports 6 falls over the weekend (2 in the house, bedroom and kitchen--nothing in hand and no injuries).  Pt reports that she has no appetite and difficulty sleeping at night.  Mom may go back to work 6/15.   Pertinent History L CVA 02/25/17; HTN, polycystic kidney disease, hyperlipidemia, sickle cell trait, scans showed 2 old CVAs, sickle cell trait, prediabetes    Limitations no driving, fall risk, visual deficits   Currently in Pain? Yes   Pain Score 10-Worst pain ever   Pain Location Head   Pain Descriptors / Indicators Aching   Pain Type Acute pain   Pain Onset In the past 7 days   Pain Frequency Constant   Aggravating Factors  unsure   Pain Relieving Factors rest         In standing functional reaching incorporating wt. Shift and trunk  rotation for incr activity tolerance, coordination, and balance.  Copying small peg design on vertical pegboard with min v.c. For accuracy and min drops with RUE.  In quadraped, alternating UE lifts and then bilateral forward/backward wt. Shifts  for incr core/scapular stability and to encourage neuro re-ed with min cueing/supervision.  In tall kneeling for incr core stability, rolling ball forward/back with supervision, min cues.  Placing grooved pegs in pegboard with min difficulty for in-hand manipulation, min v.c. For shoulder compensation initially only.                              OT Short Term Goals - 04/13/17 0809      OT SHORT TERM GOAL #1   Title Pt will be independent with initial HEP.--check STGs 04/15/17   Time 4   Period Weeks   Status Achieved  04/06/17  met     OT SHORT TERM GOAL #2   Title Pt will improve R hand coordination/functional reaching as shown by improving score on box and blocks test by at least 10.   Baseline R-28blocks, L-52blocks   Time 4   Period Weeks   Status Achieved  04/13/17:  38 blocks     OT SHORT TERM GOAL #3   Title Pt will improve R hand coordination for ADLs  as shown by improving time on 9-hole peg test by at least 10sec.   Baseline R-57.19sec   Time 4   Period Weeks   Status Achieved  04/13/17:  35.94sec     OT SHORT TERM GOAL #4   Title Pt will perform tabletop visual scanning with at least 95% accuracy with incr time.   Time 4   Period Weeks   Status Not Met  04/13/17:  inconsistent approx 78% with reading guide     OT SHORT TERM GOAL #5   Title Pt will verbalize understanding of memory/visual compensation strategies.   Time 4   Period Weeks   Status Achieved  04/06/17 met     OT SHORT TERM GOAL #6   Title Pt will improve R grip strength by at least 8lbs to assist with ADLs.   Baseline R-18lbs   Time 4   Period Weeks   Status Achieved  04/13/17:  28lbs     OT SHORT TERM GOAL #7   Title Pt will  perform dressing/bathing without falls x 2 weeks utilizing safety strategies (supervision).   Time 4   Period Weeks   Status On-going  04/13/17:  last fall in shower 04/04/17           OT Long Term Goals - 04/13/17 0822      OT LONG TERM GOAL #1   Title Pt will be independent with updated HEP.--check LTGs 05/15/17   Time 8   Period Weeks   Status New     OT LONG TERM GOAL #2   Title Pt will perform simple cooking/home maintenance tasks mod I.   Time 8   Period Weeks   Status New     OT LONG TERM GOAL #3   Title Pt will improve coordination for ADLs as shown by improving time on 9-hole peg test by at least 20sec with RUE.--revised 04/13/17:  completing test in 28sec or less   Baseline R-57.19sec   Time 8   Period Weeks   Status Revised  04/13/17:  met initial goal and revised goal (35.94sec)     OT LONG TERM GOAL #4   Title Pt will perform environmental scanning with at least 90% accuracy for incr safety in the community.   Time 8   Period Weeks   Status New     OT LONG TERM GOAL #5   Title Pt will improve R grip strength by at least 15lbs to assist with ADLs.   Baseline R-18lbs   Time 8   Period Weeks   Status New     OT LONG TERM GOAL #6   Title Pt will be able to retrieve 3lb object from overhead shelf x3 safely with RUE.   Time 8   Period Weeks   Status New               Plan - 04/17/17 1028    Clinical Impression Statement Pt continues to report falls.  Pt continues to demo balance and cognitive deficits affecting safety for ADLs/IADLs.  Pt also reports other medical issues that are impacting progress.   Rehab Potential Good   Current Impairments/barriers affecting progress: decr sensation, fall risk   OT Frequency 2x / week   OT Duration 8 weeks   OT Treatment/Interventions Self-care/ADL training;Moist Heat;Fluidtherapy;DME and/or AE instruction;Splinting;Patient/family education;Balance training;Therapeutic exercises;Contrast  Bath;Ultrasound;Therapeutic exercise;Therapeutic activities;Cognitive remediation/compensation;Passive range of motion;Functional Mobility Training;Neuromuscular education;Cryotherapy;Electrical Stimulation;Parrafin;Energy conservation;Manual Therapy;Visual/perceptual remediation/compensation   Plan continue with dynamic balance for IADLs, RUE functional  use   OT Home Exercise Plan Education provided:  03/28/17 coordination HEP, red putty HEP , red theraband issued 03/30/17   Consulted and Agree with Plan of Care Patient      Patient will benefit from skilled therapeutic intervention in order to improve the following deficits and impairments:  Decreased coordination, Impaired sensation, Decreased range of motion, Decreased safety awareness, Decreased activity tolerance, Decreased balance, Decreased knowledge of use of DME, Impaired UE functional use, Impaired vision/preception, Impaired perceived functional ability, Decreased strength, Decreased mobility, Decreased cognition  Visit Diagnosis: Hemiplegia and hemiparesis following cerebral infarction affecting right non-dominant side (HCC)  Visuospatial deficit  Muscle weakness (generalized)  Other abnormalities of gait and mobility  Unsteadiness on feet  Other lack of coordination  Attention and concentration deficit  Other disturbances of skin sensation  Unspecified symptoms and signs involving cognitive functions following cerebral infarction    Problem List Patient Active Problem List   Diagnosis Date Noted  . Hemiparesis affecting right side as late effect of cerebrovascular accident ([place]) 03/23/2017  . Lacunar infarct, acute ([place]) 03/23/2017  . Visual disturbance as complication of stroke 09/29/5207  . Adjustment disorder with anxious mood   . 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 Sickle cell trait ([place])   . Hyperlipidemia   . Thyroid nodule   . Changes in vision   . Acute ischemic  stroke ([place])   . Dysphagia, post-stroke   . Benign essential HTN   . Polycystic kidney disease   . Prediabetes   . Acute intractable headache   . Leukocytosis   . Stroke (cerebrum) ([place]) 02/25/2017    [facility] 04/17/2017, 11:04 AM  [facility] [address] [place], [place], [address] Phone: [phone]   Fax:  [phone]  Name: ANJELA CASSARA MRN: [id number] Date of Birth: 1975-11-21   Vianne Bulls, OTR/L [facility] [address] [place], [place]  [phone] phone [phone] 04/17/17 11:04 AM

## 2017-04-17 NOTE — Therapy (Signed)
[facility] [address] [place], [place], [address] Phone: [phone]   Fax:  [phone]  Speech Language Pathology Treatment  Patient Details  Name: Sharon Chan MRN: [id number] Date of Birth: 09/01/1976 No Data Recorded  Encounter Date: 04/17/2017      End of Session - 04/17/17 1616    Visit Number 4   Number of Visits 17   Date for SLP Re-Evaluation 06/02/17   SLP Start Time 1101   SLP Stop Time  1145   SLP Time Calculation (min) 44 min   Activity Tolerance Patient tolerated treatment well      Past Medical History:  Diagnosis Date  . CVA (cerebral vascular accident) (HCC)   . Polycystic disease, ovaries   . Seizures (HCC)   . Sickle cell trait [facility](HCC)     Past Surgical History:  Procedure Laterality Date  . LAPAROSCOPIC CHOLECYSTECTOMY      There were no vitals filed for this visit.      Subjective Assessment - 04/17/17 1106    Subjective "i still feel numb, the entire right side. i feel like i have had constant novicane since the stroke."   Currently in Pain? Yes   Pain Score 10-Worst pain ever   Pain Location Head   Pain Orientation Right;Left   Pain Descriptors / Indicators Aching   Pain Onset In the past 7 days   Pain Frequency Constant   Aggravating Factors  not sure   Pain Relieving Factors rest, dark and quiet               ADULT SLP TREATMENT - 04/17/17 1108      General Information   Behavior/Cognition Alert;Pleasant mood;Cooperative   Patient Positioning Upright in chair   Oral care provided N/A     Treatment Provided   Treatment provided Cognitive-Linquistic     Cognitive-Linquistic Treatment   Treatment focused on Aphasia   Skilled Treatment Pt reports she has been waking up coughing, choking at night, which she recently addressed at an appointment with her MD. States MD plans to refer her for swallowing evaluation. Denies history of reflux. Pt states she has  been limiting her social interactions, "until I can talk better." SLP reiterated previous clinician's recommendation to begin gradually bridging her social interactions beginning with small groups or one-on-one discussions in a comfortable environment. In 20 minute conversation, pt with 2 anomic errors. She initially said, "nevermind," required SLP encouragement and verbal cues to utilize description which ultimately facilitated word recall. Confrontation naming of less-commonly used words, pt names 4/11 items without delay. With extended time and occasional min A to use semantically related words, description, pt accurately names 11/11 items.      Assessment / Recommendations / Plan   Plan Continue with current plan of care     Progression Toward Goals   Progression toward goals Progressing toward goals          SLP Education - 04/17/17 1615    Education provided Yes   Education Details use written cues as backup and attempt to order for herself in a restaurant   Person(s) Educated Patient   Methods Explanation   Comprehension Verbalized understanding;Need further instruction          SLP Short Term Goals - 04/17/17 1708      SLP SHORT TERM GOAL #1   Title Pt will tell four ways to compensate for anomia   Time 3   Period Weeks  Status On-going     SLP SHORT TERM GOAL #2   Title pt will complete mod complex naming tasks independently with 90% success over three sessions   Time 3   Period Weeks   Status On-going     SLP SHORT TERM GOAL #3   Title pt will engage functionally in 10 minutes mod complex/complex conversation using compensatory strategies PRN over three sessions   Time 3   Period Weeks   Status On-going          SLP Long Term Goals - 04/17/17 1708      SLP LONG TERM GOAL #1   Title pt will engage functionally in 25 minutes mostly complex conversation outside ST room, using compensatory strategies PRN over three sessions   Time 7   Period Weeks   Status  On-going          Plan - 04/17/17 1617    Clinical Impression Statement Pt presents today with word finding deficts in focused word finding tasks, and in 20 minutes mod complex conversation. Continues to express fear re: conversing with strangers and some friends due to her lingering anomia, and did not have this fear premorbidly. MD referral pending for swallowing evaluation due to pt reports of coughing, choking at night. Skilled ST is recommended for improving her verbal expression as close to PLOF as possible.    Speech Therapy Frequency 2x / week   Treatment/Interventions Language facilitation;Compensatory techniques;Internal/external aids;SLP instruction and feedback;Multimodal communcation approach;Patient/family education;Functional tasks   Potential to Achieve Goals Good   Consulted and Agree with Plan of Care Patient      Patient will benefit from skilled therapeutic intervention in order to improve the following deficits and impairments:   Aphasia    Problem List Patient Active Problem List   Diagnosis Date Noted  . Hemiparesis affecting right side as late effect of cerebrovascular accident (HCC) 03/23/2017  . Lacunar infarct, acute (HCC) 03/23/2017  . Visual disturbance as complication of stroke 03/07/2017  . Adjustment disorder with anxious mood   . 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 Sickle cell trait (HCC)   . Hyperlipidemia   . Thyroid nodule   . Changes in vision   . Acute ischemic stroke (HCC)   . Dysphagia, post-stroke   . Benign essential HTN   . Polycystic kidney disease   . Prediabetes   . Acute intractable headache   . Leukocytosis   . Stroke (cerebrum) (HCC) 02/25/2017   Rondel Baton, MS, [name] Speech-Language Pathologist  [name] 04/17/2017, 5:09 PM  [place] [facility] [address] [place], [place], [address] Phone: [phone]   Fax:   [phone]   Name: DANASHIA LANDERS MRN: [id number] Date of Birth: [birth year]

## 2017-04-17 NOTE — Therapy (Signed)
[place] [address] [place], [place], [address] Phone: [phone]   Fax:  [phone]  Physical Therapy Treatment  Patient Details  Name: Sharon Chan MRN: [id number] Date of Birth: 05/17/1976 Referring Provider: Alysia Penna, MD  Encounter Date: 04/17/2017      PT End of Session - 04/17/17 0947    Visit Number 10   Number of Visits 18   Date for PT Re-Evaluation 05/15/17   Authorization Type G code every 10th visit   PT Start Time 0933   PT Stop Time 1014   PT Time Calculation (min) 41 min   Equipment Utilized During Treatment Gait belt      Past Medical History:  Diagnosis Date  . CVA (cerebral vascular accident) ([place])   . Polycystic disease, ovaries   . Seizures ([place])   . Sickle cell trait [facility])     Past Surgical History:  Procedure Laterality Date  . LAPAROSCOPIC CHOLECYSTECTOMY      There were no vitals filed for this visit.      Subjective Assessment - 04/17/17 0939    Subjective The patient saw her MD last week due to difficulties eating--she notes some days she only consumes 160 calories (she records this on an app).  She feels lower caloric intake creating foginess in her head and this is leading to more falls + inattention in a busy environment.  She reports falling 6 times since last session.  She notes her MD recommended a therapist to see and will be setting up an appointment.    Pertinent History sickle cell, polycystic ovarian disease, obesity, thyroid nodule   Patient Stated Goals "To get back to a sense of normalcy", improve sensation in her right side and be independent.   Currently in Pain? Yes   Pain Score 10-Worst pain ever   Pain Location Generalized   Pain Orientation Right;Left   Pain Descriptors / Indicators Aching   Pain Type Acute pain   Pain Radiating Towards notes 5-6 days of constant severe pain   Pain Onset In the past 7 days   Pain Frequency Constant   Aggravating Factors  unsure   Pain Relieving Factors rest                         [facility] Adult PT Treatment/Exercise - 04/17/17 1002      Ambulation/Gait   Ambulation/Gait Yes   Ambulation/Gait Assistance 6: Modified independent (Device/Increase time)   Ambulation/Gait Assistance Details with SPC patient is mod indep, without device, she needs supervision   Ambulation Distance (Feet) 500 Feet   Assistive device Straight cane;None   Ambulation Surface Level;Indoor   Gait velocity 2.64 ft/sec   Gait Comments Dynamic gait performing backwards walking, slow/fast ambulation with CGA.   Gait with ball toss x 500 feet working on incorporating cognitive tasks of naming foods with alphabet.  She was able to catch a ball with right hand even though notes absent sensation.       Neuro Re-ed    Neuro Re-ed Details  "up/up, down/down" x 10 reps leading right and left sides, alternating foot taps to 6" and 12' steps without UE support with CGA.  Tapping obstacles in various directions alternating LEs.                   PT Short Term Goals - 04/17/17 0948      PT SHORT TERM GOAL #1   Title  The patient will return demo HEP for R LE strength, dynamic balance activities.   Baseline 04/14/17: met today with current HEP   Status Achieved     PT SHORT TERM GOAL #2   Title The patient will improve gait speed from 2 ft/sec to > or equal to 2.4 ft/sec to demo increasing functional mobility.   Baseline 04/14/17: 2.4 ft/sec with cane today   Status Achieved     PT SHORT TERM GOAL #3   Title The patient will improve FGA from 8/30 to > or equal to 12/30 to demo improving dynamic balance/gait.   Baseline scored 13/30 on FGA on 04/12/2017   Status Achieved     PT SHORT TERM GOAL #4   Title The patient will return demo stair negotiation with one handrail, reciprocal pattern, modified indep.   Baseline 02/10/17: The patient is able to perform this mod indep, however expresses fear of  falling and has slowed pace to perform.   Status Achieved           PT Long Term Goals - 03/17/17 4628      PT LONG TERM GOAL #1   Title The patient will be indep with progression of HEP.   Baseline Target date 05/15/2017   Time 8   Period Weeks     PT LONG TERM GOAL #2   Title The patient will improve Gait speed from 2 ft/sec to > or equal to 2.8 ft/sec to demo return to "full community ambulator" classification of gait.   Baseline Target date 05/15/2017   Time 8   Period Weeks     PT LONG TERM GOAL #3   Title The patient will improve FGA from 8/30 up to 15/30 to demo improving dynamic balance activities.   Baseline Target date 05/15/2017   Time 8   Period Weeks     PT LONG TERM GOAL #4   Title Improve neuro QOL LE from 44.4% up to 60% to demo improving self perception of disability.   Baseline Target date 05/15/2017   Time 8   Period Weeks     PT LONG TERM GOAL #5   Title The patient will improve R LE strength to 5/5.   Baseline Target date 05/15/2017   Time 8   Period Weeks               Plan - 04-30-17 1013    Clinical Impression Statement The patient is showing progress this week with walking speed.  The falls continue to be prevalent and it appears multiple factors are impacting mobility including poor nutrition, decreased attention, and awareness.  She is showing consistent right foot clearance during PT activiites.   PT Treatment/Interventions ADLs/Self Care Home Management;Therapeutic activities;Therapeutic exercise;Balance training;Neuromuscular re-education;Gait training;Stair training;Patient/family education;Functional mobility training;Orthotic Fit/Training   PT Next Visit Plan Work on reciprocal gait pattern, functional/outdoor mobility and community surface negotiation, balance/coordination, obstacle negotiation.   Consulted and Agree with Plan of Care Patient      Patient will benefit from skilled therapeutic intervention in order to improve the  following deficits and impairments:  Difficulty walking, Decreased activity tolerance, Decreased balance, Decreased mobility, Decreased strength, Impaired sensation  Visit Diagnosis: Unsteadiness on feet  Other abnormalities of gait and mobility  Muscle weakness (generalized)       G-Codes - 04/30/2017 1453    Functional Assessment Tool Used (Outpatient Only) FGA=13/30   Functional Limitation Mobility: Walking and moving around   Mobility: Walking and Moving Around Current Status (  [id number]) At least 40 percent but less than 60 percent impaired, limited or restricted   Mobility: Walking and Moving Around Goal Status [phone]) At least 40 percent but less than 60 percent impaired, limited or restricted      Problem List Patient Active Problem List   Diagnosis Date Noted  . Hemiparesis affecting right side as late effect of cerebrovascular accident ([place]) 03/23/2017  . Lacunar infarct, acute ([place]) 03/23/2017  . Visual disturbance as complication of stroke 78/71/8367  . Adjustment disorder with anxious mood   . 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 Sickle cell trait ([place])   . Hyperlipidemia   . Thyroid nodule   . Changes in vision   . Acute ischemic stroke ([place])   . Dysphagia, post-stroke   . Benign essential HTN   . Polycystic kidney disease   . Prediabetes   . Acute intractable headache   . Leukocytosis   . Stroke (cerebrum) ([place]) 02/25/2017    Jayland Null, PT 04/17/2017, 2:55 PM  [place] [address] [place], [place], [address] Phone: [phone]   Fax:  [phone]  Name: JAYMI TINNER MRN: [id number] Date of Birth: 06-Nov-1976

## 2017-04-18 ENCOUNTER — Ambulatory Visit: Payer: Medicare Other | Admitting: Occupational Therapy

## 2017-04-18 ENCOUNTER — Ambulatory Visit: Payer: Medicare Other

## 2017-04-18 DIAGNOSIS — R41840 Attention and concentration deficit: Secondary | ICD-10-CM

## 2017-04-18 DIAGNOSIS — R2681 Unsteadiness on feet: Secondary | ICD-10-CM

## 2017-04-18 DIAGNOSIS — R208 Other disturbances of skin sensation: Secondary | ICD-10-CM

## 2017-04-18 DIAGNOSIS — R278 Other lack of coordination: Secondary | ICD-10-CM

## 2017-04-18 DIAGNOSIS — M6281 Muscle weakness (generalized): Secondary | ICD-10-CM | POA: Diagnosis not present

## 2017-04-18 DIAGNOSIS — I69353 Hemiplegia and hemiparesis following cerebral infarction affecting right non-dominant side: Secondary | ICD-10-CM | POA: Diagnosis not present

## 2017-04-18 DIAGNOSIS — R4701 Aphasia: Secondary | ICD-10-CM

## 2017-04-18 DIAGNOSIS — R41842 Visuospatial deficit: Secondary | ICD-10-CM | POA: Diagnosis not present

## 2017-04-18 DIAGNOSIS — R2689 Other abnormalities of gait and mobility: Secondary | ICD-10-CM

## 2017-04-18 DIAGNOSIS — I69319 Unspecified symptoms and signs involving cognitive functions following cerebral infarction: Secondary | ICD-10-CM

## 2017-04-18 NOTE — Therapy (Signed)
[place] [address] [place], [place], [address] Phone: [phone]   Fax:  [phone]  Occupational Therapy Treatment  Patient Details  Name: Sharon Chan MRN: [id number] Date of Birth: 1976/02/13 Referring Provider: Dr. Alysia Penna  Encounter Date: 04/18/2017      OT End of Session - 04/18/17 1028    Visit Number 12   Number of Visits 21   Date for OT Re-Evaluation 05/15/17   Authorization Type Medicare/Medicaid; g-code needed   Authorization - Visit Number 12   Authorization - Number of Visits 20   OT Start Time 1020   OT Stop Time 1100   OT Time Calculation (min) 40 min   Activity Tolerance Patient tolerated treatment well   Behavior During Therapy [facility] for tasks assessed/performed      Past Medical History:  Diagnosis Date  . CVA (cerebral vascular accident) ([place])   . Polycystic disease, ovaries   . Seizures ([place])   . Sickle cell trait [facility])     Past Surgical History:  Procedure Laterality Date  . LAPAROSCOPIC CHOLECYSTECTOMY      There were no vitals filed for this visit.      Subjective Assessment - 04/18/17 1025    Subjective  Pt reports that she made tuna melt last night in toaster oven (dropped 1, mother pre-boiled eggs).  Had fall prior to getting in car this morning.   Pertinent History L CVA 02/25/17; HTN, polycystic kidney disease, hyperlipidemia, sickle cell trait, scans showed 2 old CVAs, sickle cell trait, prediabetes    Limitations no driving, fall risk, visual deficits   Currently in Pain? Yes   Pain Score 10-Worst pain ever   Pain Location Head   Pain Orientation Right;Left   Pain Descriptors / Indicators Aching   Pain Type Acute pain   Pain Onset In the past 7 days   Pain Frequency Constant   Aggravating Factors  unknown   Pain Relieving Factors rest, dark, quiet          In standing functional reaching incorporating wt. Shift and trunk rotation, reaching  outside base of support for incr activity tolerance/strength, coordination, and balance (to place clothespins with 1-8lb resistance on vertical pole).  No LOB.  IADL task:  Simulated making bed including gathering items and then carrying to place in hamper when finished.  Pt completed with supervision and no LOB.  Arm bike x6 min level 3 for reciprocal movement/conditioning without rest (forward/backwards).  Environmental scanning in minimally distracting environment.  Ambulating with cane to locate 100% of items in first attempt today with no LOB.  Stacking coins with min difficulty.  Then manipulating in-hand to place in coin bank with min difficulty--improved.  In quadraped, alternating UE lifts and then bilateral forward/backward wt. Shifts  for incr core/scapular stability and to encourage neuro re-ed with min cueing/supervision.                           OT Short Term Goals - 04/13/17 0809      OT SHORT TERM GOAL #1   Title Pt will be independent with initial HEP.--check STGs 04/15/17   Time 4   Period Weeks   Status Achieved  04/06/17  met     OT SHORT TERM GOAL #2   Title Pt will improve R hand coordination/functional reaching as shown by improving score on box and blocks test by at least 10.   Baseline R-28blocks,  L-52blocks   Time 4   Period Weeks   Status Achieved  04/13/17:  38 blocks     OT SHORT TERM GOAL #3   Title Pt will improve R hand coordination for ADLs as shown by improving time on 9-hole peg test by at least 10sec.   Baseline R-57.19sec   Time 4   Period Weeks   Status Achieved  04/13/17:  35.94sec     OT SHORT TERM GOAL #4   Title Pt will perform tabletop visual scanning with at least 95% accuracy with incr time.   Time 4   Period Weeks   Status Not Met  04/13/17:  inconsistent approx 78% with reading guide     OT SHORT TERM GOAL #5   Title Pt will verbalize understanding of memory/visual compensation strategies.   Time 4    Period Weeks   Status Achieved  04/06/17 met     OT SHORT TERM GOAL #6   Title Pt will improve R grip strength by at least 8lbs to assist with ADLs.   Baseline R-18lbs   Time 4   Period Weeks   Status Achieved  04/13/17:  28lbs     OT SHORT TERM GOAL #7   Title Pt will perform dressing/bathing without falls x 2 weeks utilizing safety strategies (supervision).   Time 4   Period Weeks   Status On-going  04/13/17:  last fall in shower 04/04/17           OT Long Term Goals - 04/13/17 0822      OT LONG TERM GOAL #1   Title Pt will be independent with updated HEP.--check LTGs 05/15/17   Time 8   Period Weeks   Status New     OT LONG TERM GOAL #2   Title Pt will perform simple cooking/home maintenance tasks mod I.   Time 8   Period Weeks   Status New     OT LONG TERM GOAL #3   Title Pt will improve coordination for ADLs as shown by improving time on 9-hole peg test by at least 20sec with RUE.--revised 04/13/17:  completing test in 28sec or less   Baseline R-57.19sec   Time 8   Period Weeks   Status Revised  04/13/17:  met initial goal and revised goal (35.94sec)     OT LONG TERM GOAL #4   Title Pt will perform environmental scanning with at least 90% accuracy for incr safety in the community.   Time 8   Period Weeks   Status New     OT LONG TERM GOAL #5   Title Pt will improve R grip strength by at least 15lbs to assist with ADLs.   Baseline R-18lbs   Time 8   Period Weeks   Status New     OT LONG TERM GOAL #6   Title Pt will be able to retrieve 3lb object from overhead shelf x3 safely with RUE.   Time 8   Period Weeks   Status New               Plan - 04/18/17 1059    Clinical Impression Statement Pt had another fall this morning.  However, pt is progressing with improving environmental scanning and RUE functional use, strength and coordination.     Rehab Potential Good   Current Impairments/barriers affecting progress: decr sensation, fall risk   OT  Frequency 2x / week   OT Duration 8 weeks   OT Treatment/Interventions Self-care/ADL  training;Moist Heat;Fluidtherapy;DME and/or AE instruction;Splinting;Patient/family education;Balance training;Therapeutic exercises;Contrast Bath;Ultrasound;Therapeutic exercise;Therapeutic activities;Cognitive remediation/compensation;Passive range of motion;Functional Mobility Training;Neuromuscular education;Cryotherapy;Electrical Stimulation;Parrafin;Energy conservation;Manual Therapy;Visual/perceptual remediation/compensation   Plan continue with dynamic balance for IADLs, RUE functional use, cognition and visual scanning   OT Home Exercise Plan Education provided:  03/28/17 coordination HEP, red putty HEP , red theraband issued 03/30/17   Consulted and Agree with Plan of Care Patient      Patient will benefit from skilled therapeutic intervention in order to improve the following deficits and impairments:  Decreased coordination, Impaired sensation, Decreased range of motion, Decreased safety awareness, Decreased activity tolerance, Decreased balance, Decreased knowledge of use of DME, Impaired UE functional use, Impaired vision/preception, Impaired perceived functional ability, Decreased strength, Decreased mobility, Decreased cognition  Visit Diagnosis: Hemiplegia and hemiparesis following cerebral infarction affecting right non-dominant side (HCC)  Visuospatial deficit  Muscle weakness (generalized)  Other abnormalities of gait and mobility  Unsteadiness on feet  Other lack of coordination  Attention and concentration deficit  Other disturbances of skin sensation  Unspecified symptoms and signs involving cognitive functions following cerebral infarction    Problem List Patient Active Problem List   Diagnosis Date Noted  . Hemiparesis affecting right side as late effect of cerebrovascular accident ([place]) 03/23/2017  . Lacunar infarct, acute ([place]) 03/23/2017  . Visual disturbance as  complication of stroke 83/77/9396  . Adjustment disorder with anxious mood   . 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 Sickle cell trait ([place])   . Hyperlipidemia   . Thyroid nodule   . Changes in vision   . Acute ischemic stroke ([place])   . Dysphagia, post-stroke   . Benign essential HTN   . Polycystic kidney disease   . Prediabetes   . Acute intractable headache   . Leukocytosis   . Stroke (cerebrum) ([place]) 02/25/2017    [facility] 04/18/2017, 11:00 AM  [place] [address] [place], [place], [address] Phone: [phone]   Fax:  [phone]  Name: Sharon Chan MRN: [id number] Date of Birth: 1976-11-07   Vianne Bulls, OTR/L [facility] [address] [place], [place]  [address] [phone] phone [phone] 04/18/17 11:00 AM

## 2017-04-19 ENCOUNTER — Telehealth: Payer: Self-pay | Admitting: Family Medicine

## 2017-04-19 NOTE — Telephone Encounter (Signed)
Carrie checking on referral from NP Jerrilyn CairoKim Harris placed 05/23, calling to check on it.  Thank you,  -LL

## 2017-04-19 NOTE — Telephone Encounter (Signed)
Called patient left message to call back.pb

## 2017-04-20 ENCOUNTER — Ambulatory Visit: Payer: Medicare Other | Admitting: Occupational Therapy

## 2017-04-20 ENCOUNTER — Ambulatory Visit: Payer: Medicare Other | Admitting: Rehabilitative and Restorative Service Providers"

## 2017-04-20 DIAGNOSIS — R208 Other disturbances of skin sensation: Secondary | ICD-10-CM

## 2017-04-20 DIAGNOSIS — M6281 Muscle weakness (generalized): Secondary | ICD-10-CM

## 2017-04-20 DIAGNOSIS — R41842 Visuospatial deficit: Secondary | ICD-10-CM

## 2017-04-20 DIAGNOSIS — R41840 Attention and concentration deficit: Secondary | ICD-10-CM

## 2017-04-20 DIAGNOSIS — I69353 Hemiplegia and hemiparesis following cerebral infarction affecting right non-dominant side: Secondary | ICD-10-CM | POA: Diagnosis not present

## 2017-04-20 DIAGNOSIS — R2689 Other abnormalities of gait and mobility: Secondary | ICD-10-CM | POA: Diagnosis not present

## 2017-04-20 DIAGNOSIS — R2681 Unsteadiness on feet: Secondary | ICD-10-CM | POA: Diagnosis not present

## 2017-04-20 DIAGNOSIS — R278 Other lack of coordination: Secondary | ICD-10-CM

## 2017-04-20 DIAGNOSIS — I69319 Unspecified symptoms and signs involving cognitive functions following cerebral infarction: Secondary | ICD-10-CM

## 2017-04-20 NOTE — Therapy (Signed)
[facility] [address] [place], [place], [address] Phone: [phone]   Fax:  [phone]  Physical Therapy Treatment  Patient Details  Name: Sharon Chan MRN: [id number] Date of Birth: 09/08/1976 Referring Provider: Alysia Penna, MD  Encounter Date: 04/20/2017      PT End of Session - 04/20/17 1100    Visit Number 11   Number of Visits 18   Date for PT Re-Evaluation 05/15/17   Authorization Type G code every 10th visit   PT Start Time 1022   PT Stop Time 1102   PT Time Calculation (min) 40 min   Equipment Utilized During Treatment Gait belt      Past Medical History:  Diagnosis Date  . CVA (cerebral vascular accident) ([place])   . Polycystic disease, ovaries   . Seizures ([place])   . Sickle cell trait [facility])     Past Surgical History:  Procedure Laterality Date  . LAPAROSCOPIC CHOLECYSTECTOMY      There were no vitals filed for this visit.      Subjective Assessment - 04/20/17 1025    Subjective The patient reports she went to the grocery store yesterday and felt anxious due to busy store.  She also notes anxious today because of how busy our clinic is.     Pertinent History sickle cell, polycystic ovarian disease, obesity, thyroid nodule   Patient Stated Goals "To get back to a sense of normalcy", improve sensation in her right side and be independent.   Currently in Pain? Yes   Pain Score 10-Worst pain ever   Pain Location Generalized   Pain Descriptors / Indicators Aching   Pain Type Acute pain   Pain Onset 1 to 4 weeks ago   Pain Frequency Constant   Aggravating Factors  unknown   Pain Relieving Factors resting                         [facility] Adult PT Treatment/Exercise - 04/20/17 1029      Ambulation/Gait   Ambulation/Gait Yes   Ambulation/Gait Assistance 6: Modified independent (Device/Increase time)   Ambulation/Gait Assistance Details with SPC mod indep, without  device with close supervision.  Patient ambulated community surfaces with SPC mod indep on grass, rubber mulch, up/down curb mod indep without loss of blance.     Assistive device Straight cane;None   Gait velocity 2.66 ft/sec   Gait Comments Emphasized gait speed without device x 600 ft nonstop, ball toss with gait activities with CGA.      Self-Care   Self-Care Other Self-Care Comments   Other Self-Care Comments  Discussed increasing activity in the home and doing limited walking in community to increase endurance.                   PT Short Term Goals - 04/17/17 0948      PT SHORT TERM GOAL #1   Title The patient will return demo HEP for R LE strength, dynamic balance activities.   Baseline 04/14/17: met today with current HEP   Status Achieved     PT SHORT TERM GOAL #2   Title The patient will improve gait speed from 2 ft/sec to > or equal to 2.4 ft/sec to demo increasing functional mobility.   Baseline 04/14/17: 2.4 ft/sec with cane today   Status Achieved     PT SHORT TERM GOAL #3   Title The patient will improve FGA from 8/30 to >  or equal to 12/30 to demo improving dynamic balance/gait.   Baseline scored 13/30 on FGA on 04/12/2017   Status Achieved     PT SHORT TERM GOAL #4   Title The patient will return demo stair negotiation with one handrail, reciprocal pattern, modified indep.   Baseline 02/10/17: The patient is able to perform this mod indep, however expresses fear of falling and has slowed pace to perform.   Status Achieved           PT Long Term Goals - 03/17/17 6333      PT LONG TERM GOAL #1   Title The patient will be indep with progression of HEP.   Baseline Target date 05/15/2017   Time 8   Period Weeks     PT LONG TERM GOAL #2   Title The patient will improve Gait speed from 2 ft/sec to > or equal to 2.8 ft/sec to demo return to "full community ambulator" classification of gait.   Baseline Target date 05/15/2017   Time 8   Period Weeks     PT  LONG TERM GOAL #3   Title The patient will improve FGA from 8/30 up to 15/30 to demo improving dynamic balance activities.   Baseline Target date 05/15/2017   Time 8   Period Weeks     PT LONG TERM GOAL #4   Title Improve neuro QOL LE from 44.4% up to 60% to demo improving self perception of disability.   Baseline Target date 05/15/2017   Time 8   Period Weeks     PT LONG TERM GOAL #5   Title The patient will improve R LE strength to 5/5.   Baseline Target date 05/15/2017   Time 8   Period Weeks               Plan - 04/20/17 1217    Clinical Impression Statement The patient is demonstrating progress with gait mechanics and reciprocal pattern.  PT continuing to work towards [employer] emphasizing return to community activities.    PT Treatment/Interventions ADLs/Self Care Home Management;Therapeutic activities;Therapeutic exercise;Balance training;Neuromuscular re-education;Gait training;Stair training;Patient/family education;Functional mobility training;Orthotic Fit/Training   PT Next Visit Plan Work on reciprocal gait pattern, functional/outdoor mobility and community surface negotiation, balance/coordination, obstacle negotiation.   Consulted and Agree with Plan of Care Patient      Patient will benefit from skilled therapeutic intervention in order to improve the following deficits and impairments:  Difficulty walking, Decreased activity tolerance, Decreased balance, Decreased mobility, Decreased strength, Impaired sensation  Visit Diagnosis: Other abnormalities of gait and mobility  Unsteadiness on feet  Muscle weakness (generalized)     Problem List Patient Active Problem List   Diagnosis Date Noted  . Hemiparesis affecting right side as late effect of cerebrovascular accident ([place]) 03/23/2017  . Lacunar infarct, acute ([place]) 03/23/2017  . Visual disturbance as complication of stroke 54/56/2563  . Adjustment disorder with anxious mood   . 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 Sickle cell trait ([place])   . Hyperlipidemia   . Thyroid nodule   . Changes in vision   . Acute ischemic stroke ([place])   . Dysphagia, post-stroke   . Benign essential HTN   . Polycystic kidney disease   . Prediabetes   . Acute intractable headache   . Leukocytosis   . Stroke (cerebrum) ([place]) 02/25/2017    Collie Kittel, PT 04/20/2017, 1:04 PM  [facility] [address]  [place], [place], [address] Phone: [phone]   Fax:  [phone]  Name: CECILEE ROSNER MRN: [id number] Date of Birth: Jun 12, 1976

## 2017-04-20 NOTE — Patient Instructions (Signed)
  Please complete the assigned speech therapy homework prior to your next session and return it to the speech therapist at your next visit.  

## 2017-04-20 NOTE — Therapy (Signed)
[facility] [address] [place], [place], [address] Phone: [phone]   Fax:  [phone]  Speech Language Pathology Treatment  Patient Details  Name: Sharon Chan MRN: [id number] Date of Birth: 07-20-1976 No Data Recorded  Encounter Date: 04/18/2017      End of Session - 04/20/17 1133    Visit Number 5   Number of Visits 17   Date for SLP Re-Evaluation 06/02/17   SLP Start Time 1105   SLP Stop Time  1145   SLP Time Calculation (min) 40 min   Activity Tolerance Patient tolerated treatment well      Past Medical History:  Diagnosis Date  . CVA (cerebral vascular accident) ([facility])   . Polycystic disease, ovaries   . Seizures ([facility])   . Sickle cell trait [facility])     Past Surgical History:  Procedure Laterality Date  . LAPAROSCOPIC CHOLECYSTECTOMY      There were no vitals filed for this visit.      Subjective Assessment - 04/20/17 1132    Subjective Pt reports h/a 10/10 on left side. "I'm just trying to live with it until it subsides."   Currently in Pain? Yes  see "s"   Pain Onset In the past 7 days               ADULT SLP TREATMENT - 04/20/17 0001      General Information   Behavior/Cognition Alert;Pleasant mood;Cooperative     Treatment Provided   Treatment provided Cognitive-Linquistic     Cognitive-Linquistic Treatment   Treatment focused on Aphasia   Skilled Treatment Reviewed pt homework with her - pt endorsed becoming more anxious when anomia occurs, even with homework. SLP affirmed tihs and encouraged pt that she planned to have friends over for 30 minutes - pt states she is anxious about this. In structured tasks today pt req'd occasional mod semantic cues for mod complex responsive naming.      Assessment / Recommendations / Plan   Plan Continue with current plan of care     Progression Toward Goals   Progression toward goals Progressing toward goals            SLP  Short Term Goals - 04/18/17 1107      SLP SHORT TERM GOAL #1   Title Pt will tell four ways to compensate for anomia   Status Achieved     SLP SHORT TERM GOAL #2   Title pt will complete mod complex naming tasks independently with 90% success over three sessions   Time 2   Period Weeks   Status On-going     SLP SHORT TERM GOAL #3   Title pt will engage functionally in 10 minutes mod complex/complex conversation using compensatory strategies PRN over three sessions   Time 2   Period Weeks   Status On-going          SLP Long Term Goals - 04/18/17 1134      SLP LONG TERM GOAL #1   Title pt will engage functionally in 25 minutes mostly complex conversation outside ST room, using compensatory strategies PRN over three sessions   Time 6   Period Weeks   Status On-going          Plan - 04/20/17 1133    Clinical Impression Statement Pt presents today with con't'd word finding deficts in focused word finding tasks, but minimally in mod complex conversation. Continues expresses anxiety re: conversing with strangers and some  friends due to her lingering anomia. Wynonia LawmanHoweve is taking steps to meet with "safe" friends for 30 minutes Friday. Skilled ST is recommended for improving her verbal expression as close to PLOF as possible.    Speech Therapy Frequency 2x / week   Duration --  8 eweks   Treatment/Interventions Language facilitation;Compensatory techniques;Internal/external aids;SLP instruction and feedback;Multimodal communcation approach;Patient/family education;Functional tasks   Potential to Achieve Goals Good      Patient will benefit from skilled therapeutic intervention in order to improve the following deficits and impairments:   Aphasia    Problem List Patient Active Problem List   Diagnosis Date Noted  . Hemiparesis affecting right side as late effect of cerebrovascular accident (HCC) 03/23/2017  . Lacunar infarct, acute (HCC) 03/23/2017  . Visual disturbance as  complication of stroke 03/07/2017  . Adjustment disorder with anxious mood   . 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 Sickle cell trait (H[facility])   . Hyperlipidemia   . Thyroid nodule   . Changes in vision   . Acute ischemic stroke (H[facility])   . Dysphagia, post-stroke   . Benign essential HTN   . Polycystic kidney disease   . Prediabetes   . Acute intractable headache   . Leukocytosis   . Stroke (cerebrum) (H[facility]) 02/25/2017    Shavonn Convey ,MS, [name]  04/20/2017, 11:35 AM  [facility] [address] [place], [place], [address] Phone: [phone]   Fax:  [phone]   Name: Sharon Chan MRN: [id number] Date of Birth: 11/17/1975

## 2017-04-20 NOTE — Therapy (Signed)
[facility] [address] [place], [place], [address] Phone: [phone]   Fax:  [phone]  Occupational Therapy Treatment  Patient Details  Name: Sharon Chan MRN: [id number] Date of Birth: 02-12-1976 Referring Provider: Dr. Alysia Penna  Encounter Date: 04/20/2017      OT End of Session - 04/20/17 1113    Visit Number 13   Number of Visits 21   Date for OT Re-Evaluation 05/15/17   Authorization Type Medicare/Medicaid; g-code needed   Authorization - Visit Number 13   Authorization - Number of Visits 20   OT Start Time 8786   OT Stop Time 1150   OT Time Calculation (min) 45 min   Activity Tolerance Patient tolerated treatment well   Behavior During Therapy [facility] for tasks assessed/performed      Past Medical History:  Diagnosis Date  . CVA (cerebral vascular accident) ([place])   . Polycystic disease, ovaries   . Seizures ([place])   . Sickle cell trait [facility])     Past Surgical History:  Procedure Laterality Date  . LAPAROSCOPIC CHOLECYSTECTOMY      There were no vitals filed for this visit.      Subjective Assessment - 04/20/17 1106    Subjective  Pt reports that she did well at the grocery store.  No falls.   Pertinent History L CVA 02/25/17; HTN, polycystic kidney disease, hyperlipidemia, sickle cell trait, scans showed 2 old CVAs, sickle cell trait, prediabetes    Limitations no driving, fall risk, visual deficits   Currently in Pain? Yes   Pain Score 8    Pain Location --  Generalized L side, headache   Pain Orientation Left   Pain Descriptors / Indicators Aching   Pain Type Acute pain   Pain Onset In the past 7 days   Pain Frequency Constant   Aggravating Factors  unknown   Pain Relieving Factors dark, quiet environment for headache, medication      Placing [place] pegs in pegboard with tweezers for incr coordination with min difficulty/incr time.  In standing functional reaching  incorporating wt. Shift and trunk rotation, reaching/stepping outside base of support for incr activity tolerance/strength, coordination, and balance (to place/remove clothespins with 1-8lb resist).  Tabletop visual scanning/number cancellation with cognitive component to locate # that repeats 4 times.    Completing [name] Puzzle (approx 45 pieces) with difficulty initially, but improved the more she completed, incr time but no cueing required.                      OT Education - 04/20/17 1112    Education Details Recommendation for psychological/neuro psych services due to anxiety reported--pt reports that MD discussed this as well and may make a referral next visit (per pt request to wait)   Person(s) Educated Patient   Methods Explanation   Comprehension Verbalized understanding          OT Short Term Goals - 04/13/17 0809      OT SHORT TERM GOAL #1   Title Pt will be independent with initial HEP.--check STGs 04/15/17   Time 4   Period Weeks   Status Achieved  04/06/17  met     OT SHORT TERM GOAL #2   Title Pt will improve R hand coordination/functional reaching as shown by improving score on box and blocks test by at least 10.   Baseline R-28blocks, L-52blocks   Time 4   Period Weeks   Status Achieved  04/13/17:  38 blocks     OT SHORT TERM GOAL #3   Title Pt will improve R hand coordination for ADLs as shown by improving time on 9-hole peg test by at least 10sec.   Baseline R-57.19sec   Time 4   Period Weeks   Status Achieved  04/13/17:  35.94sec     OT SHORT TERM GOAL #4   Title Pt will perform tabletop visual scanning with at least 95% accuracy with incr time.   Time 4   Period Weeks   Status Not Met  04/13/17:  inconsistent approx 78% with reading guide     OT SHORT TERM GOAL #5   Title Pt will verbalize understanding of memory/visual compensation strategies.   Time 4   Period Weeks   Status Achieved  04/06/17 met     OT SHORT TERM GOAL #6    Title Pt will improve R grip strength by at least 8lbs to assist with ADLs.   Baseline R-18lbs   Time 4   Period Weeks   Status Achieved  04/13/17:  28lbs     OT SHORT TERM GOAL #7   Title Pt will perform dressing/bathing without falls x 2 weeks utilizing safety strategies (supervision).   Time 4   Period Weeks   Status On-going  04/13/17:  last fall in shower 04/04/17           OT Long Term Goals - 04/13/17 0822      OT LONG TERM GOAL #1   Title Pt will be independent with updated HEP.--check LTGs 05/15/17   Time 8   Period Weeks   Status New     OT LONG TERM GOAL #2   Title Pt will perform simple cooking/home maintenance tasks mod I.   Time 8   Period Weeks   Status New     OT LONG TERM GOAL #3   Title Pt will improve coordination for ADLs as shown by improving time on 9-hole peg test by at least 20sec with RUE.--revised 04/13/17:  completing test in 28sec or less   Baseline R-57.19sec   Time 8   Period Weeks   Status Revised  04/13/17:  met initial goal and revised goal (35.94sec)     OT LONG TERM GOAL #4   Title Pt will perform environmental scanning with at least 90% accuracy for incr safety in the community.   Time 8   Period Weeks   Status New     OT LONG TERM GOAL #5   Title Pt will improve R grip strength by at least 15lbs to assist with ADLs.   Baseline R-18lbs   Time 8   Period Weeks   Status New     OT LONG TERM GOAL #6   Title Pt will be able to retrieve 3lb object from overhead shelf x3 safely with RUE.   Time 8   Period Weeks   Status New               Plan - 04/20/17 1114    Clinical Impression Statement Pt is progressing with coordination.  Pt reports that she is doing more in the kitchen as well.   Rehab Potential Good   Current Impairments/barriers affecting progress: decr sensation, fall risk   OT Frequency 2x / week   OT Duration 8 weeks   OT Treatment/Interventions Self-care/ADL training;Moist Heat;Fluidtherapy;DME and/or AE  instruction;Splinting;Patient/family education;Balance training;Therapeutic exercises;Contrast Bath;Ultrasound;Therapeutic exercise;Therapeutic activities;Cognitive remediation/compensation;Passive range of motion;Functional Mobility Training;Neuromuscular education;Cryotherapy;Electrical Stimulation;Parrafin;Energy conservation;Manual  Therapy;Visual/perceptual remediation/compensation   Plan ? cooking task (continue with dynamic balance for IADLs, RUE functional use, congition and visual scanning)   OT Home Exercise Plan Education provided:  03/28/17 coordination HEP, red putty HEP , red theraband issued 03/30/17   Consulted and Agree with Plan of Care Patient      Patient will benefit from skilled therapeutic intervention in order to improve the following deficits and impairments:  Decreased coordination, Impaired sensation, Decreased range of motion, Decreased safety awareness, Decreased activity tolerance, Decreased balance, Decreased knowledge of use of DME, Impaired UE functional use, Impaired vision/preception, Impaired perceived functional ability, Decreased strength, Decreased mobility, Decreased cognition  Visit Diagnosis: Hemiplegia and hemiparesis following cerebral infarction affecting right non-dominant side (HCC)  Visuospatial deficit  Muscle weakness (generalized)  Other abnormalities of gait and mobility  Unsteadiness on feet  Other lack of coordination  Attention and concentration deficit  Other disturbances of skin sensation  Unspecified symptoms and signs involving cognitive functions following cerebral infarction    Problem List Patient Active Problem List   Diagnosis Date Noted  . Hemiparesis affecting right side as late effect of cerebrovascular accident ([place]) 03/23/2017  . Lacunar infarct, acute ([place]) 03/23/2017  . Visual disturbance as complication of stroke 72/25/7505  . Adjustment disorder with anxious mood   . 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 Sickle cell trait ([place])   . Hyperlipidemia   . Thyroid nodule   . Changes in vision   . Acute ischemic stroke ([place])   . Dysphagia, post-stroke   . Benign essential HTN   . Polycystic kidney disease   . Prediabetes   . Acute intractable headache   . Leukocytosis   . Stroke (cerebrum) ([place]) 02/25/2017    [facility] 04/20/2017, 12:10 PM  [facility] [address] [place], [place], [address] Phone: [phone]   Fax:  [phone]  Name: KIMMY TOTTEN MRN: [id number] Date of Birth: Oct 08, 1976   Vianne Bulls, OTR/L [facility] [address] [place], [place]  [address] [phone] phone [phone] 04/20/17 12:10 PM

## 2017-04-24 ENCOUNTER — Ambulatory Visit: Payer: Medicare Other | Admitting: Oncology

## 2017-04-24 ENCOUNTER — Ambulatory Visit: Payer: Medicare Other

## 2017-04-24 ENCOUNTER — Ambulatory Visit: Payer: Medicare Other | Admitting: Rehabilitative and Restorative Service Providers"

## 2017-04-24 ENCOUNTER — Ambulatory Visit: Payer: Medicare Other | Admitting: Occupational Therapy

## 2017-04-24 ENCOUNTER — Telehealth: Payer: Self-pay | Admitting: *Deleted

## 2017-04-24 DIAGNOSIS — R2681 Unsteadiness on feet: Secondary | ICD-10-CM | POA: Diagnosis not present

## 2017-04-24 DIAGNOSIS — R2689 Other abnormalities of gait and mobility: Secondary | ICD-10-CM | POA: Diagnosis not present

## 2017-04-24 DIAGNOSIS — R41842 Visuospatial deficit: Secondary | ICD-10-CM | POA: Diagnosis not present

## 2017-04-24 DIAGNOSIS — R208 Other disturbances of skin sensation: Secondary | ICD-10-CM

## 2017-04-24 DIAGNOSIS — R278 Other lack of coordination: Secondary | ICD-10-CM | POA: Diagnosis not present

## 2017-04-24 DIAGNOSIS — I69353 Hemiplegia and hemiparesis following cerebral infarction affecting right non-dominant side: Secondary | ICD-10-CM | POA: Diagnosis not present

## 2017-04-24 DIAGNOSIS — R4701 Aphasia: Secondary | ICD-10-CM

## 2017-04-24 DIAGNOSIS — M6281 Muscle weakness (generalized): Secondary | ICD-10-CM | POA: Diagnosis not present

## 2017-04-24 DIAGNOSIS — R41840 Attention and concentration deficit: Secondary | ICD-10-CM

## 2017-04-24 DIAGNOSIS — I69319 Unspecified symptoms and signs involving cognitive functions following cerebral infarction: Secondary | ICD-10-CM

## 2017-04-24 NOTE — Therapy (Signed)
[facility] [address] [place], [place], [address] Phone: [phone]   Fax:  [phone]  Physical Therapy Treatment  Patient Details  Name: Sharon Chan MRN: [id number] Date of Birth: [birth year] Referring Provider: Alysia Penna, MD  Encounter Date: 04/24/2017      PT End of Session - 04/24/17 1231    Visit Number 12   Number of Visits 18   Date for PT Re-Evaluation 05/15/17   Authorization Type G code every 10th visit   PT Start Time 1150   PT Stop Time 1230   PT Time Calculation (min) 40 min   Equipment Utilized During Treatment --   Activity Tolerance Patient tolerated treatment well   Behavior During Therapy [facility] for tasks assessed/performed      Past Medical History:  Diagnosis Date  . CVA (cerebral vascular accident) ([place])   . Polycystic disease, ovaries   . Seizures ([place])   . Sickle cell trait [facility])     Past Surgical History:  Procedure Laterality Date  . LAPAROSCOPIC CHOLECYSTECTOMY      There were no vitals filed for this visit.      Subjective Assessment - 04/24/17 1152    Subjective The patient reports that she had a busy weekend.  She is not sleeping well most nights (with a sensation of choking and hard to swallow), reports that she fell asleep early last night (at 6pm) and slept all night.  She stayed on her own this weekend to test greater independence.  No falls this weekend.     Pertinent History sickle cell, polycystic ovarian disease, obesity, thyroid nodule   Patient Stated Goals "To get back to a sense of normalcy", improve sensation in her right side and be independent.   Currently in Pain? Yes   Pain Score 7   "better today", neck pain noted since stroke   Pain Location Generalized  neck and head hurting since CVA   Pain Descriptors / Indicators Aching   Pain Type Acute pain   Pain Onset 1 to 4 weeks ago   Pain Frequency Constant   Aggravating Factors  unknown   Pain Relieving Factors unknown            [facility] PT Assessment - 04/24/17 1158      Strength   Right Hip Flexion 5/5   Left Hip Flexion 5/5   Right Knee Flexion 5/5   Right Knee Extension 5/5   Right Ankle Dorsiflexion 5/5                     [facility] Adult PT Treatment/Exercise - 04/24/17 1200      Ambulation/Gait   Ambulation/Gait Yes   Ambulation/Gait Assistance 6: Modified independent (Device/Increase time)   Ambulation/Gait Assistance Details no device today in clinic   Assistive device None   Gait Comments Gait activities >1500 ft total with direction changes, ball toss, heel/toe walking, marching with emphasis on increasing R LE motor timing/speed for more normalized pattern.      Self-Care   Self-Care Other Self-Care Comments   Other Self-Care Comments  Discussed continued increase in gait to begin to work towards prior home walking program.      Neuro Re-ed    Neuro Re-ed Details  "up/up, down/down" for coordination x 10 reps , right and left sides, alternating foot taps.                   PT Short Term Goals -  04/17/17 0948      PT SHORT TERM GOAL #1   Title The patient will return demo HEP for R LE strength, dynamic balance activities.   Baseline 04/14/17: met today with current HEP   Status Achieved     PT SHORT TERM GOAL #2   Title The patient will improve gait speed from 2 ft/sec to > or equal to 2.4 ft/sec to demo increasing functional mobility.   Baseline 04/14/17: 2.4 ft/sec with cane today   Status Achieved     PT SHORT TERM GOAL #3   Title The patient will improve FGA from 8/30 to > or equal to 12/30 to demo improving dynamic balance/gait.   Baseline scored 13/30 on FGA on 04/12/2017   Status Achieved     PT SHORT TERM GOAL #4   Title The patient will return demo stair negotiation with one handrail, reciprocal pattern, modified indep.   Baseline 02/10/17: The patient is able to perform this mod indep, however expresses fear of  falling and has slowed pace to perform.   Status Achieved           PT Long Term Goals - 04/24/17 1518      PT LONG TERM GOAL #1   Title The patient will be indep with progression of HEP.   Baseline Target date 05/15/2017   Time 8   Period Weeks     PT LONG TERM GOAL #2   Title The patient will improve Gait speed from 2 ft/sec to > or equal to 2.8 ft/sec to demo return to "full community ambulator" classification of gait.   Baseline Target date 05/15/2017   Time 8   Period Weeks     PT LONG TERM GOAL #3   Title The patient will improve FGA from 8/30 up to 15/30 to demo improving dynamic balance activities.   Baseline Target date 05/15/2017   Time 8   Period Weeks     PT LONG TERM GOAL #4   Title Improve neuro QOL LE from 44.4% up to 60% to demo improving self perception of disability.   Baseline Target date 05/15/2017   Time 8   Period Weeks     PT LONG TERM GOAL #5   Title The patient will improve R LE strength to 5/5.   Baseline Met on 04/24/17   Time 8   Period Weeks   Status Achieved               Plan - 04/24/17 1232    Clinical Impression Statement The patient is working towards [place] with increased safety in the clinic without device, greater confidence per reports, less falls over past week and continued increase in social and functional activities.  PT to continue working towards [place].    PT Treatment/Interventions ADLs/Self Care Home Management;Therapeutic activities;Therapeutic exercise;Balance training;Neuromuscular re-education;Gait training;Stair training;Patient/family education;Functional mobility training;Orthotic Fit/Training   PT Next Visit [facility] (work outside in therapy), floor transfers, outdoor mobility, community surfaces, dynamic gait, balance/coordination, neck stretching to help with HA?/tightness   Consulted and Agree with Plan of Care Patient      Patient will benefit from skilled therapeutic intervention in order to improve the  following deficits and impairments:  Difficulty walking, Decreased activity tolerance, Decreased balance, Decreased mobility, Decreased strength, Impaired sensation  Visit Diagnosis: Muscle weakness (generalized)  Other abnormalities of gait and mobility  Unsteadiness on feet     Problem List Patient Active Problem List   Diagnosis Date Noted  .  Hemiparesis affecting right side as late effect of cerebrovascular accident ([place]) 03/23/2017  . Lacunar infarct, acute ([place]) 03/23/2017  . Visual disturbance as complication of stroke 95/74/7340  . Adjustment disorder with anxious mood   . 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 Sickle cell trait ([place])   . Hyperlipidemia   . Thyroid nodule   . Changes in vision   . Acute ischemic stroke ([place])   . Dysphagia, post-stroke   . Benign essential HTN   . Polycystic kidney disease   . Prediabetes   . Acute intractable headache   . Leukocytosis   . Stroke (cerebrum) ([place]) 02/25/2017    Denia Mcvicar, PT 04/24/2017, 3:18 PM  [facility] [address] [place], [place], [address] Phone: [phone]   Fax:  [phone]  Name: Sharon Chan MRN: [id number] Date of Birth: 1976/03/06

## 2017-04-24 NOTE — Therapy (Signed)
[facility] [address] [place], [place], [address] Phone: [phone]   Fax:  [phone]  Occupational Therapy Treatment  Patient Details  Name: Sharon Chan MRN: [id number] Date of Birth: 04/05/1976 Referring Provider: Dr. Alysia Penna  Encounter Date: 04/24/2017      OT End of Session - 04/24/17 0938    Visit Number 14   Number of Visits 21   Date for OT Re-Evaluation 05/15/17   Authorization Type Medicare/Medicaid; g-code needed   Authorization - Visit Number 14   Authorization - Number of Visits 20   OT Start Time 6767   OT Stop Time 1015   OT Time Calculation (min) 40 min   Activity Tolerance Patient tolerated treatment well   Behavior During Therapy [facility] for tasks assessed/performed      Past Medical History:  Diagnosis Date  . CVA (cerebral vascular accident) ([place])   . Polycystic disease, ovaries   . Seizures ([place])   . Sickle cell trait [facility])     Past Surgical History:  Procedure Laterality Date  . LAPAROSCOPIC CHOLECYSTECTOMY      There were no vitals filed for this visit.      Subjective Assessment - 04/24/17 0937    Subjective  I had friends over and it went well.  I'm trying to keep my anxiety from stopping me.  No falls.  Made a pound cake and did some cooking over the weekend   Pertinent History L CVA 02/25/17; HTN, polycystic kidney disease, hyperlipidemia, sickle cell trait, scans showed 2 old CVAs, sickle cell trait, prediabetes    Limitations no driving, fall risk, visual deficits   Currently in Pain? Yes   Pain Score 7    Pain Location --  generalized + head   Pain Descriptors / Indicators Aching   Pain Type Acute pain   Pain Onset In the past 7 days   Aggravating Factors  unknown   Pain Relieving Factors unknown         Novel simple cooking task following directions (rice side dish with multiple steps):  Pt initially did not read directions until cued.  Pt then  needed to check directions multiple times due to memory deficits.  Pt completed task safely, but did make minor error with order of directions (did not significantly change outcome of dish).  Pt with no LOB and monitored temp and remembered to turn off burner with conversation for divided atttention.  Incr time needed overall.                         OT Short Term Goals - 04/13/17 0809      OT SHORT TERM GOAL #1   Title Pt will be independent with initial HEP.--check STGs 04/15/17   Time 4   Period Weeks   Status Achieved  04/06/17  met     OT SHORT TERM GOAL #2   Title Pt will improve R hand coordination/functional reaching as shown by improving score on box and blocks test by at least 10.   Baseline R-28blocks, L-52blocks   Time 4   Period Weeks   Status Achieved  04/13/17:  38 blocks     OT SHORT TERM GOAL #3   Title Pt will improve R hand coordination for ADLs as shown by improving time on 9-hole peg test by at least 10sec.   Baseline R-57.19sec   Time 4   Period Weeks   Status Achieved  04/13/17:  35.94sec     OT SHORT TERM GOAL #4   Title Pt will perform tabletop visual scanning with at least 95% accuracy with incr time.   Time 4   Period Weeks   Status Not Met  04/13/17:  inconsistent approx 78% with reading guide     OT SHORT TERM GOAL #5   Title Pt will verbalize understanding of memory/visual compensation strategies.   Time 4   Period Weeks   Status Achieved  04/06/17 met     OT SHORT TERM GOAL #6   Title Pt will improve R grip strength by at least 8lbs to assist with ADLs.   Baseline R-18lbs   Time 4   Period Weeks   Status Achieved  04/13/17:  28lbs     OT SHORT TERM GOAL #7   Title Pt will perform dressing/bathing without falls x 2 weeks utilizing safety strategies (supervision).   Time 4   Period Weeks   Status On-going  04/13/17:  last fall in shower 04/04/17           OT Long Term Goals - 04/24/17 1308      OT LONG TERM GOAL #1    Title Pt will be independent with updated HEP.--check LTGs 05/15/17   Time 8   Period Weeks   Status New     OT LONG TERM GOAL #2   Title Pt will perform simple cooking/home maintenance tasks mod I.   Time 8   Period Weeks   Status New     OT LONG TERM GOAL #3   Title Pt will improve coordination for ADLs as shown by improving time on 9-hole peg test by at least 20sec with RUE.--revised 04/13/17:  completing test in 28sec or less   Baseline R-57.19sec   Time 8   Period Weeks   Status Revised  04/13/17:  met initial goal and revised goal (35.94sec)     OT LONG TERM GOAL #4   Title Pt will perform environmental scanning with at least 90% accuracy for incr safety in the community.   Time 8   Period Weeks   Status New     OT LONG TERM GOAL #5   Title Pt will improve R grip strength by at least 15lbs to assist with ADLs.   Baseline R-18lbs   Time 8   Period Weeks   Status New     OT LONG TERM GOAL #6   Title Pt will be able to retrieve 3lb object from overhead shelf x3 safely with RUE.   Time 8   Period Weeks   Status New               Plan - 04/24/17 0017    Clinical Impression Statement Pt is progressing towards goals with improving IADL performance and improved RUE functional use.   Rehab Potential Good   Current Impairments/barriers affecting progress: decr sensation, fall risk   OT Frequency 2x / week   OT Duration 8 weeks   OT Treatment/Interventions Self-care/ADL training;Moist Heat;Fluidtherapy;DME and/or AE instruction;Splinting;Patient/family education;Balance training;Therapeutic exercises;Contrast Bath;Ultrasound;Therapeutic exercise;Therapeutic activities;Cognitive remediation/compensation;Passive range of motion;Functional Mobility Training;Neuromuscular education;Cryotherapy;Electrical Stimulation;Parrafin;Energy conservation;Manual Therapy;Visual/perceptual remediation/compensation   Plan continue with dynamic balance for IADLs, RUE functional use,  cognition and visual scanning   OT Home Exercise Plan Education provided:  03/28/17 coordination HEP, red putty HEP , red theraband issued 03/30/17   Consulted and Agree with Plan of Care Patient      Patient will benefit from skilled  therapeutic intervention in order to improve the following deficits and impairments:  Decreased coordination, Impaired sensation, Decreased range of motion, Decreased safety awareness, Decreased activity tolerance, Decreased balance, Decreased knowledge of use of DME, Impaired UE functional use, Impaired vision/preception, Impaired perceived functional ability, Decreased strength, Decreased mobility, Decreased cognition  Visit Diagnosis: Hemiplegia and hemiparesis following cerebral infarction affecting right non-dominant side (HCC)  Visuospatial deficit  Muscle weakness (generalized)  Other abnormalities of gait and mobility  Unsteadiness on feet  Other lack of coordination  Attention and concentration deficit  Unspecified symptoms and signs involving cognitive functions following cerebral infarction  Other disturbances of skin sensation    Problem List Patient Active Problem List   Diagnosis Date Noted  . Hemiparesis affecting right side as late effect of cerebrovascular accident ([place]) 03/23/2017  . Lacunar infarct, acute ([place]) 03/23/2017  . Visual disturbance as complication of stroke 29/12/1113  . Adjustment disorder with anxious mood   . 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 Sickle cell trait ([place])   . Hyperlipidemia   . Thyroid nodule   . Changes in vision   . Acute ischemic stroke ([place])   . Dysphagia, post-stroke   . Benign essential HTN   . Polycystic kidney disease   . Prediabetes   . Acute intractable headache   . Leukocytosis   . Stroke (cerebrum) ([place]) 02/25/2017    [facility] 04/24/2017, 1:08 PM  [facility] [address] [place], [place], [address] Phone: [phone]   Fax:  [phone]  Name: Sharon Chan MRN: [id number] Date of Birth: January 19, 1976   Vianne Bulls, OTR/L [facility] [address] [place], [place]  [address] [phone] phone [phone] 04/24/17 1:10 PM

## 2017-04-24 NOTE — Therapy (Signed)
[facility] [address] [place], [place], [address] Phone: [phone]   Fax:  [phone]  Speech Language Pathology Treatment  Patient Details  Name: Sharon Chan MRN: [id number] Date of Birth: 03/08/1976 No Data Recorded  Encounter Date: 04/24/2017      End of Session - 04/24/17 1005    Visit Number 6   Number of Visits 17   Date for SLP Re-Evaluation 06/02/17   SLP Start Time 0849   SLP Stop Time  0932   SLP Time Calculation (min) 43 min   Activity Tolerance Patient tolerated treatment well      Past Medical History:  Diagnosis Date  . CVA (cerebral vascular accident) (HCC)   . Polycystic disease, ovaries   . Seizures (HCC)   . Sickle cell trait [facility])     Past Surgical History:  Procedure Laterality Date  . LAPAROSCOPIC CHOLECYSTECTOMY      There were no vitals filed for this visit.      Subjective Assessment - 04/24/17 0908    Subjective "I had to call my sister -- this morning becuase I couldn't think of - a name from -- what's that show that Sharon Chan is on?"               ADULT SLP TREATMENT - 04/24/17 0958      General Information   Behavior/Cognition Alert;Pleasant mood;Cooperative  mildly emotional     Treatment Provided   Treatment provided Cognitive-Linquistic     Cognitive-Linquistic Treatment   Treatment focused on Aphasia   Skilled Treatment Pt had successful meeting with friends on Friday. Practiced to tell SLP "It went well" as she stated she had been saying "It well went" all weekend, when thinking about how she was going to respond to SLP. Pt also reported difficulty with reading numbers over the weekend (in her Bible). Pt became emotional when telling SLP she stuttered as a child and did not talk much, and those same feelings are returning (does not want to talk for fear of looking different and making mistakes with her speech). SLP encouraged pt to push  through those feelings and cont to talk with people. Pt has another meeting planned with friends this Friday night for 60 minutes instead of just 30 minutes. SLP congratulated pt on making this "date" with friends. Education re: slowed speech rate with pt was covered as well.      Assessment / Recommendations / Plan   Plan Continue with current plan of care     Progression Toward Goals   Progression toward goals Progressing toward goals          SLP Education - 04/24/17 1004    Education provided Yes   Education Details Compensation - slow rate of speech   Person(s) Educated Patient   Methods Explanation;Demonstration   Comprehension Verbalized understanding;Returned demonstration;Need further instruction          SLP Short Term Goals - 04/24/17 1007      SLP SHORT TERM GOAL #1   Title Pt will tell four ways to compensate for anomia   Status Achieved     SLP SHORT TERM GOAL #2   Title pt will complete mod complex naming tasks independently with 90% success over three sessions   Time 1   Period Weeks   Status On-going     SLP SHORT TERM GOAL #3   Title pt will engage functionally in 10 minutes mod complex/complex conversation using compensatory  strategies PRN over three sessions   Baseline 04-24-17,   Time 1   Period Weeks   Status On-going          SLP Long Term Goals - 04/24/17 1008      SLP LONG TERM GOAL #1   Title pt will engage functionally in 25 minutes mostly complex conversation outside ST room, using compensatory strategies PRN over three sessions   Time 5   Period Weeks   Status On-going          Plan - 04/24/17 1006    Clinical Impression Statement Pt presents today with con't'd word finding deficts in focused word finding tasks. As anxiety incr'd talking about her anxiety re: conversing with strangers and some friends, anomia episodes increased. Pt to cont to meet with "safe" friends, this time for 60 minutes Friday, possibly over dinner. Skilled  ST is recommended for improving her verbal expression as close to PLOF as possible.    Speech Therapy Frequency 2x / week   Duration --  8 eweks   Treatment/Interventions Language facilitation;Compensatory techniques;Internal/external aids;SLP instruction and feedback;Multimodal communcation approach;Patient/family education;Functional tasks   Potential to Achieve Goals Good      Patient will benefit from skilled therapeutic intervention in order to improve the following deficits and impairments:   Aphasia    Problem List Patient Active Problem List   Diagnosis Date Noted  . Hemiparesis affecting right side as late effect of cerebrovascular accident (HCC) 03/23/2017  . Lacunar infarct, acute (HCC) 03/23/2017  . Visual disturbance as complication of stroke 03/07/2017  . Adjustment disorder with anxious mood   . 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 Sickle cell trait (HCC)   . Hyperlipidemia   . Thyroid nodule   . Changes in vision   . Acute ischemic stroke (HCC)   . Dysphagia, post-stroke   . Benign essential HTN   . Polycystic kidney disease   . Prediabetes   . Acute intractable headache   . Leukocytosis   . Stroke (cerebrum) (HCC) 02/25/2017    Sharon Chan ,MS, [name]  04/24/2017, 10:08 AM   [facility] [address] [place], [place], [address] Phone: [phone]   Fax:  [phone]   Name: Sharon Chan MRN: [id number] Date of Birth: 09/13/1976

## 2017-04-24 NOTE — Telephone Encounter (Signed)
"  This is [name]'s mom calling to cancel today's appointment with Dr. Truett PernaSherrill.  She's worn out from Physical.  I'd better let her call back to reschedule.  I know Tuesday's and Wednesday's are not good days and she'll need something at 2:00 pm or 3:00 pm."    Will notify provider.

## 2017-04-27 ENCOUNTER — Ambulatory Visit: Payer: Medicare Other | Admitting: Speech Pathology

## 2017-04-27 ENCOUNTER — Ambulatory Visit: Payer: Medicare Other | Admitting: Occupational Therapy

## 2017-04-27 DIAGNOSIS — R278 Other lack of coordination: Secondary | ICD-10-CM | POA: Diagnosis not present

## 2017-04-27 DIAGNOSIS — I69319 Unspecified symptoms and signs involving cognitive functions following cerebral infarction: Secondary | ICD-10-CM

## 2017-04-27 DIAGNOSIS — R2681 Unsteadiness on feet: Secondary | ICD-10-CM | POA: Diagnosis not present

## 2017-04-27 DIAGNOSIS — R208 Other disturbances of skin sensation: Secondary | ICD-10-CM

## 2017-04-27 DIAGNOSIS — R41840 Attention and concentration deficit: Secondary | ICD-10-CM

## 2017-04-27 DIAGNOSIS — R2689 Other abnormalities of gait and mobility: Secondary | ICD-10-CM | POA: Diagnosis not present

## 2017-04-27 DIAGNOSIS — M6281 Muscle weakness (generalized): Secondary | ICD-10-CM | POA: Diagnosis not present

## 2017-04-27 DIAGNOSIS — R41842 Visuospatial deficit: Secondary | ICD-10-CM | POA: Diagnosis not present

## 2017-04-27 DIAGNOSIS — R4701 Aphasia: Secondary | ICD-10-CM

## 2017-04-27 DIAGNOSIS — I69353 Hemiplegia and hemiparesis following cerebral infarction affecting right non-dominant side: Secondary | ICD-10-CM

## 2017-04-27 NOTE — Therapy (Signed)
[facility] [address] [place], [place], [address] Phone: [phone]   Fax:  [phone]  Occupational Therapy Treatment  Patient Details  Name: Sharon Chan MRN: [id number] Date of Birth: 1976/09/15 Referring Provider: Dr. Alysia Penna  Encounter Date: 04/27/2017      OT End of Session - 04/27/17 1028    Visit Number 15   Number of Visits 21   Date for OT Re-Evaluation 05/15/17   Authorization Type Medicare/Medicaid; g-code needed   Authorization - Visit Number 15   Authorization - Number of Visits 20   OT Start Time 1021   OT Stop Time 1100   OT Time Calculation (min) 39 min   Activity Tolerance Patient tolerated treatment well   Behavior During Therapy [facility] for tasks assessed/performed      Past Medical History:  Diagnosis Date  . CVA (cerebral vascular accident) ([place])   . Polycystic disease, ovaries   . Seizures ([place])   . Sickle cell trait [facility])     Past Surgical History:  Procedure Laterality Date  . LAPAROSCOPIC CHOLECYSTECTOMY      There were no vitals filed for this visit.      Subjective Assessment - 04/27/17 1023    Subjective  Feel this morning in grass when getting in [place]   Pertinent History L CVA 02/25/17; HTN, polycystic kidney disease, hyperlipidemia, sickle cell trait, scans showed 2 old CVAs, sickle cell trait, prediabetes    Limitations no driving, fall risk, visual deficits   Currently in Pain? Yes   Pain Score 7    Pain Location --  L side, head/neck   Pain Orientation Left   Pain Descriptors / Indicators Aching   Pain Type Acute pain   Pain Onset More than a month ago   Pain Frequency Constant   Aggravating Factors  unknown    Pain Relieving Factors unknown        Tabletop visual scanning using blue reading guide with incr time and min difficulty.    Environmental scanning in mod distracting/busy/dynamic environment with 14/15 items found initially and  remaining item found on 2nd attempt.  Pt reports that she needs to be able to wrap presents and is unsure about cutting.  Practiced cutting with scissors:  Cutting out simple shapes, lines, curves, and zig-zags with min difficulty.  Pt reports that she has not attempted to shave her legs.  Discussed concern over how much pressure that she would put on razor.  Recommended pt continue using Carlton Adam or trial shaving non-boney area initially.  Pt verbalized understanding.                        OT Short Term Goals - 04/13/17 0809      OT SHORT TERM GOAL #1   Title Pt will be independent with initial HEP.--check STGs 04/15/17   Time 4   Period Weeks   Status Achieved  04/06/17  met     OT SHORT TERM GOAL #2   Title Pt will improve R hand coordination/functional reaching as shown by improving score on box and blocks test by at least 10.   Baseline R-28blocks, L-52blocks   Time 4   Period Weeks   Status Achieved  04/13/17:  38 blocks     OT SHORT TERM GOAL #3   Title Pt will improve R hand coordination for ADLs as shown by improving time on 9-hole peg test by at least 10sec.  Baseline R-57.19sec   Time 4   Period Weeks   Status Achieved  04/13/17:  35.94sec     OT SHORT TERM GOAL #4   Title Pt will perform tabletop visual scanning with at least 95% accuracy with incr time.   Time 4   Period Weeks   Status Not Met  04/13/17:  inconsistent approx 78% with reading guide     OT SHORT TERM GOAL #5   Title Pt will verbalize understanding of memory/visual compensation strategies.   Time 4   Period Weeks   Status Achieved  04/06/17 met     OT SHORT TERM GOAL #6   Title Pt will improve R grip strength by at least 8lbs to assist with ADLs.   Baseline R-18lbs   Time 4   Period Weeks   Status Achieved  04/13/17:  28lbs     OT SHORT TERM GOAL #7   Title Pt will perform dressing/bathing without falls x 2 weeks utilizing safety strategies (supervision).   Time 4   Period  Weeks   Status On-going  04/13/17:  last fall in shower 04/04/17           OT Long Term Goals - 04/24/17 1308      OT LONG TERM GOAL #1   Title Pt will be independent with updated HEP.--check LTGs 05/15/17   Time 8   Period Weeks   Status New     OT LONG TERM GOAL #2   Title Pt will perform simple cooking/home maintenance tasks mod I.   Time 8   Period Weeks   Status New     OT LONG TERM GOAL #3   Title Pt will improve coordination for ADLs as shown by improving time on 9-hole peg test by at least 20sec with RUE.--revised 04/13/17:  completing test in 28sec or less   Baseline R-57.19sec   Time 8   Period Weeks   Status Revised  04/13/17:  met initial goal and revised goal (35.94sec)     OT LONG TERM GOAL #4   Title Pt will perform environmental scanning with at least 90% accuracy for incr safety in the community.   Time 8   Period Weeks   Status New     OT LONG TERM GOAL #5   Title Pt will improve R grip strength by at least 15lbs to assist with ADLs.   Baseline R-18lbs   Time 8   Period Weeks   Status New     OT LONG TERM GOAL #6   Title Pt will be able to retrieve 3lb object from overhead shelf x3 safely with RUE.   Time 8   Period Weeks   Status New               Plan - 04/27/17 1036    Clinical Impression Statement Pt is progressing with tabletop visual scanning.  Pt continues to "guard" RUE with ambulation at times and continues to have falls although with decr frequency (due to cognitive, sensation, balance deficits).   Rehab Potential Good   Current Impairments/barriers affecting progress: decr sensation, fall risk   OT Frequency 2x / week   OT Duration 8 weeks   OT Treatment/Interventions Self-care/ADL training;Moist Heat;Fluidtherapy;DME and/or AE instruction;Splinting;Patient/family education;Balance training;Therapeutic exercises;Contrast Bath;Ultrasound;Therapeutic exercise;Therapeutic activities;Cognitive remediation/compensation;Passive range  of motion;Functional Mobility Training;Neuromuscular education;Cryotherapy;Electrical Stimulation;Parrafin;Energy conservation;Manual Therapy;Visual/perceptual remediation/compensation   Plan cooking with following written directions, environmental scanning, simple divided attention, dynamic balance for IADLs   OT Home Exercise Plan  Education provided:  03/28/17 coordination HEP, red putty HEP , red theraband issued 03/30/17   Consulted and Agree with Plan of Care Patient      Patient will benefit from skilled therapeutic intervention in order to improve the following deficits and impairments:  Decreased coordination, Impaired sensation, Decreased range of motion, Decreased safety awareness, Decreased activity tolerance, Decreased balance, Decreased knowledge of use of DME, Impaired UE functional use, Impaired vision/preception, Impaired perceived functional ability, Decreased strength, Decreased mobility, Decreased cognition  Visit Diagnosis: Hemiplegia and hemiparesis following cerebral infarction affecting right non-dominant side (HCC)  Visuospatial deficit  Unsteadiness on feet  Other abnormalities of gait and mobility  Other lack of coordination  Attention and concentration deficit  Unspecified symptoms and signs involving cognitive functions following cerebral infarction  Other disturbances of skin sensation    Problem List Patient Active Problem List   Diagnosis Date Noted  . Hemiparesis affecting right side as late effect of cerebrovascular accident ([place]) 03/23/2017  . Lacunar infarct, acute ([place]) 03/23/2017  . Visual disturbance as complication of stroke 09/29/5207  . Adjustment disorder with anxious mood   . 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 Sickle cell trait ([place])   . Hyperlipidemia   . Thyroid nodule   . Changes in vision   . Acute ischemic stroke ([place])   . Dysphagia, post-stroke   . Benign essential HTN   . Polycystic  kidney disease   . Prediabetes   . Acute intractable headache   . Leukocytosis   . Stroke (cerebrum) [facility]) 02/25/2017    [facility] 04/27/2017, 10:39 AM  [facility] [address] [place], [place], [address] Phone: [phone]   Fax:  [phone]  Name: AFSANA LIERA MRN: [id number] Date of Birth: Feb 17, 1976   Vianne Bulls, OTR/L [facility] [address] [place], [place]  [address] [phone] phone [phone] 04/27/17 10:39 AM

## 2017-04-27 NOTE — Therapy (Signed)
[facility] [address] [place], [place], [address] Phone: [phone]   Fax:  [phone]  Speech Language Pathology Treatment  Patient Details  Name: Sharon Chan MRN: [id number] Date of Birth: 1976-03-13 No Data Recorded  Encounter Date: 04/27/2017    Past Medical History:  Diagnosis Date  . CVA (cerebral vascular accident) ([place])   . Polycystic disease, ovaries   . Seizures ([place])   . Sickle cell trait [facility])     Past Surgical History:  Procedure Laterality Date  . LAPAROSCOPIC CHOLECYSTECTOMY      There were no vitals filed for this visit.      Subjective Assessment - 04/27/17 0854    Subjective "I fell in the parking lot this morning."   Currently in Pain? Yes   Pain Score 7    Pain Location Head   Pain Orientation Posterior   Pain Descriptors / Indicators Aching;Constant   Pain Type Chronic pain   Pain Onset 1 to 4 weeks ago   Pain Frequency Constant   Pain Relieving Factors topomax               ADULT SLP TREATMENT - 04/27/17 0856      General Information   Behavior/Cognition Alert;Pleasant mood;Cooperative   Patient Positioning Upright in chair   Oral care provided N/A     Treatment Provided   Treatment provided Cognitive-Linquistic     Cognitive-Linquistic Treatment   Treatment focused on Aphasia   Skilled Treatment Pt reports she has been making dates with friends and is gradually becoming more comfortable speaking in small groups of close friends. SLP facilitated 20 minutes conversation; with initial instruction pt utilized compensations for anomia as she described her educational background and motivations for working in [employer]. She states she reads at home but often forgets what she reads. SLP encouraged pt to simulate leading Bible studies at home by reading passages aloud and writing a summary/perspective on brief selections.       Assessment / Recommendations / Plan    Plan Continue with current plan of care     Progression Toward Goals   Progression toward goals Progressing toward goals            SLP Short Term Goals - 04/27/17 1203      SLP SHORT TERM GOAL #1   Title Pt will tell four ways to compensate for anomia   Status Achieved     SLP SHORT TERM GOAL #2   Title pt will complete mod complex naming tasks independently with 90% success over three sessions   Time 1   Status Not Met     SLP SHORT TERM GOAL #3   Title pt will engage functionally in 10 minutes mod complex/complex conversation using compensatory strategies PRN over three sessions   Baseline 04-24-17, 04/27/17   Time 1   Period Weeks   Status Achieved          SLP Long Term Goals - 04/27/17 1211      SLP LONG TERM GOAL #1   Title pt will engage functionally in 25 minutes mostly complex conversation outside [place] room, using compensatory strategies PRN over three sessions   Time 4   Period Weeks   Status On-going          Plan - 04/27/17 1211    Clinical Impression Statement Pt presents today with con't'd word finding deficts in conversation and word-finding tasks. Pt reports she continues to have anxiety around  speaking, particularly in larger groups of people. She was able to attend church with her "safe" friends this past week. States she notices more frequent "pausing" in conversation when she doesn't interact socially for several days. Skilled ST is recommended for improving her verbal expression as close to PLOF as possible.    Speech Therapy Frequency 2x / week   Treatment/Interventions Language facilitation;Compensatory techniques;Internal/external aids;SLP instruction and feedback;Multimodal communcation approach;Patient/family education;Functional tasks   Potential to Achieve Goals Good   SLP Home Exercise Plan reading aloud, writing bible interpretations   Consulted and Agree with Plan of Care Patient      Patient will benefit from skilled therapeutic  intervention in order to improve the following deficits and impairments:   Aphasia    Problem List Patient Active Problem List   Diagnosis Date Noted  . Hemiparesis affecting right side as late effect of cerebrovascular accident ([place]) 03/23/2017  . Lacunar infarct, acute ([place]) 03/23/2017  . Visual disturbance as complication of stroke 85/92/7639  . Adjustment disorder with anxious mood   . 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 Sickle cell trait ([place])   . Hyperlipidemia   . Thyroid nodule   . Changes in vision   . Acute ischemic stroke ([place])   . Dysphagia, post-stroke   . Benign essential HTN   . Polycystic kidney disease   . Prediabetes   . Acute intractable headache   . Leukocytosis   . Stroke (cerebrum) ([place]) 02/25/2017   Deneise Lever, [place], [place] 04/27/2017, 12:37 PM  [facility] [address] [place], [place], [address] Phone: [phone]   Fax:  [phone]   Name: ARBOR LEER MRN: [id number] Date of Birth: November 30, 1975

## 2017-05-02 ENCOUNTER — Ambulatory Visit: Payer: Medicare Other | Admitting: Occupational Therapy

## 2017-05-02 ENCOUNTER — Ambulatory Visit: Payer: Medicare Other

## 2017-05-02 DIAGNOSIS — I69353 Hemiplegia and hemiparesis following cerebral infarction affecting right non-dominant side: Secondary | ICD-10-CM

## 2017-05-02 DIAGNOSIS — R2689 Other abnormalities of gait and mobility: Secondary | ICD-10-CM | POA: Diagnosis not present

## 2017-05-02 DIAGNOSIS — R4701 Aphasia: Secondary | ICD-10-CM

## 2017-05-02 DIAGNOSIS — I69319 Unspecified symptoms and signs involving cognitive functions following cerebral infarction: Secondary | ICD-10-CM

## 2017-05-02 DIAGNOSIS — M6281 Muscle weakness (generalized): Secondary | ICD-10-CM | POA: Diagnosis not present

## 2017-05-02 DIAGNOSIS — R2681 Unsteadiness on feet: Secondary | ICD-10-CM | POA: Diagnosis not present

## 2017-05-02 DIAGNOSIS — R41842 Visuospatial deficit: Secondary | ICD-10-CM | POA: Diagnosis not present

## 2017-05-02 DIAGNOSIS — R278 Other lack of coordination: Secondary | ICD-10-CM | POA: Diagnosis not present

## 2017-05-02 NOTE — Therapy (Signed)
[facility] [address] [place], [place], [address] Phone: [phone]   Fax:  [phone]  Occupational Therapy Treatment  Patient Details  Name: Sharon Chan MRN: [id number] Date of Birth: 1976-03-06 Referring Provider: Dr. Alysia Penna  Encounter Date: 05/02/2017      OT End of Session - 05/02/17 1204    Visit Number 16   Number of Visits 21   Date for OT Re-Evaluation 05/15/17   Authorization Type Medicare/Medicaid; g-code needed   Authorization - Visit Number 16   Authorization - Number of Visits 20   OT Start Time 1021   OT Stop Time 1100   OT Time Calculation (min) 39 min   Activity Tolerance Patient tolerated treatment well      Past Medical History:  Diagnosis Date  . CVA (cerebral vascular accident) ([place])   . Polycystic disease, ovaries   . Seizures ([place])   . Sickle cell trait [facility])     Past Surgical History:  Procedure Laterality Date  . LAPAROSCOPIC CHOLECYSTECTOMY      There were no vitals filed for this visit.      Subjective Assessment - 05/02/17 1024    Subjective  It's been a really rough weekend (pt's niece tried to commit suicide). I forgot to take my headache medicine today with everything going on   Pertinent History L CVA 02/25/17; HTN, polycystic kidney disease, hyperlipidemia, sickle cell trait, scans showed 2 old CVAs, sickle cell trait, prediabetes    Limitations no driving, fall risk, visual deficits   Currently in Pain? Yes   Pain Score 10-Worst pain ever   Pain Location Neck   Pain Orientation Left   Pain Descriptors / Indicators Aching   Pain Onset More than a month ago   Pain Frequency Constant                      OT Treatments/Exercises (OP) - 05/02/17 0001      ADLs   Cooking Pt asked to cook muffins following written directions: Pt making several small mistakes and/or safety concerns - forgetting to close overhead cabinet door, setting  oven temperature beyond what recipe called for, forgetting to move cane out of way when putting muffins in oven. Pt appeared to get distracted easily, and distracted when trying to hold conversation. Pt also needed reminder for safety getting muffins out of oven including one hand counter top support, and hand placement on pan for better stability. Pt took extra time to complete task   Home Maintenance Pt cleaned up dishes while muffins in oven, by loading dishes into dishwasher, putting things in trash, and milk back in refrigerator using one hand countertop support.    ADL Comments Discussed simple ways to incorporate divided attention tasks at home since this is very difficult for her. Discussed simple physical task while carrying conversation or background noise.      **Note - above task may have also potentially been more difficult today d/t increased headache and family concerns (see subjective report)             OT Short Term Goals - 04/13/17 0809      OT SHORT TERM GOAL #1   Title Pt will be independent with initial HEP.--check STGs 04/15/17   Time 4   Period Weeks   Status Achieved  04/06/17  met     OT SHORT TERM GOAL #2   Title Pt will improve R hand coordination/functional reaching  as shown by improving score on box and blocks test by at least 10.   Baseline R-28blocks, L-52blocks   Time 4   Period Weeks   Status Achieved  04/13/17:  38 blocks     OT SHORT TERM GOAL #3   Title Pt will improve R hand coordination for ADLs as shown by improving time on 9-hole peg test by at least 10sec.   Baseline R-57.19sec   Time 4   Period Weeks   Status Achieved  04/13/17:  35.94sec     OT SHORT TERM GOAL #4   Title Pt will perform tabletop visual scanning with at least 95% accuracy with incr time.   Time 4   Period Weeks   Status Not Met  04/13/17:  inconsistent approx 78% with reading guide     OT SHORT TERM GOAL #5   Title Pt will verbalize understanding of memory/visual  compensation strategies.   Time 4   Period Weeks   Status Achieved  04/06/17 met     OT SHORT TERM GOAL #6   Title Pt will improve R grip strength by at least 8lbs to assist with ADLs.   Baseline R-18lbs   Time 4   Period Weeks   Status Achieved  04/13/17:  28lbs     OT SHORT TERM GOAL #7   Title Pt will perform dressing/bathing without falls x 2 weeks utilizing safety strategies (supervision).   Time 4   Period Weeks   Status On-going  04/13/17:  last fall in shower 04/04/17           OT Long Term Goals - 04/24/17 1308      OT LONG TERM GOAL #1   Title Pt will be independent with updated HEP.--check LTGs 05/15/17   Time 8   Period Weeks   Status New     OT LONG TERM GOAL #2   Title Pt will perform simple cooking/home maintenance tasks mod I.   Time 8   Period Weeks   Status New     OT LONG TERM GOAL #3   Title Pt will improve coordination for ADLs as shown by improving time on 9-hole peg test by at least 20sec with RUE.--revised 04/13/17:  completing test in 28sec or less   Baseline R-57.19sec   Time 8   Period Weeks   Status Revised  04/13/17:  met initial goal and revised goal (35.94sec)     OT LONG TERM GOAL #4   Title Pt will perform environmental scanning with at least 90% accuracy for incr safety in the community.   Time 8   Period Weeks   Status New     OT LONG TERM GOAL #5   Title Pt will improve R grip strength by at least 15lbs to assist with ADLs.   Baseline R-18lbs   Time 8   Period Weeks   Status New     OT LONG TERM GOAL #6   Title Pt will be able to retrieve 3lb object from overhead shelf x3 safely with RUE.   Time 8   Period Weeks   Status New               Plan - 05/02/17 1204    Clinical Impression Statement Pt still requires supervision/min cueing for safety with cooking tasks. Pt with max difficulty with divided attention tasks and easily distracted.    Rehab Potential Good   Current Impairments/barriers affecting progress:  decr sensation, fall risk  OT Frequency 2x / week   OT Duration 8 weeks   OT Treatment/Interventions Self-care/ADL training;Moist Heat;Fluidtherapy;DME and/or AE instruction;Splinting;Patient/family education;Balance training;Therapeutic exercises;Contrast Bath;Ultrasound;Therapeutic exercise;Therapeutic activities;Cognitive remediation/compensation;Passive range of motion;Functional Mobility Training;Neuromuscular education;Cryotherapy;Electrical Stimulation;Parrafin;Energy conservation;Manual Therapy;Visual/perceptual remediation/compensation   Plan environmental scanning, simple divided attention, practice buttons and tying shoes per pt request   OT Home Exercise Plan Education provided:  03/28/17 coordination HEP, red putty HEP , red theraband issued 03/30/17   Consulted and Agree with Plan of Care Patient      Patient will benefit from skilled therapeutic intervention in order to improve the following deficits and impairments:  Decreased coordination, Impaired sensation, Decreased range of motion, Decreased safety awareness, Decreased activity tolerance, Decreased balance, Decreased knowledge of use of DME, Impaired UE functional use, Impaired vision/preception, Impaired perceived functional ability, Decreased strength, Decreased mobility, Decreased cognition  Visit Diagnosis: Hemiplegia and hemiparesis following cerebral infarction affecting right non-dominant side (HCC)  Other abnormalities of gait and mobility  Unspecified symptoms and signs involving cognitive functions following cerebral infarction    Problem List Patient Active Problem List   Diagnosis Date Noted  . Hemiparesis affecting right side as late effect of cerebrovascular accident ([place]) 03/23/2017  . Lacunar infarct, acute ([place]) 03/23/2017  . Visual disturbance as complication of stroke 04/79/9872  . Adjustment disorder with anxious mood   . 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  Sickle cell trait ([place])   . Hyperlipidemia   . Thyroid nodule   . Changes in vision   . Acute ischemic stroke ([place])   . Dysphagia, post-stroke   . Benign essential HTN   . Polycystic kidney disease   . Prediabetes   . Acute intractable headache   . Leukocytosis   . Stroke (cerebrum) ([place]) 02/25/2017    Carey Bullocks, OTR/L 05/02/2017, 12:07 PM  [facility] [address] [place], [place], [address] Phone: [phone]   Fax:  [phone]  Name: Sharon Chan MRN: [id number] Date of Birth: 15-Oct-1976

## 2017-05-02 NOTE — Therapy (Signed)
[facility] [address] [place], [place], [address] Phone: [phone]   Fax:  [phone]  Speech Language Pathology Treatment  Patient Details  Name: Sharon Chan MRN: [id number] Date of Birth: 21-Mar-1976 No Data Recorded  Encounter Date: 05/02/2017      End of Session - 05/02/17 1410    Visit Number 7   Number of Visits 17   Date for SLP Re-Evaluation 06/02/17   SLP Start Time 1103   SLP Stop Time  1145   SLP Time Calculation (min) 42 min   Activity Tolerance Patient tolerated treatment well      Past Medical History:  Diagnosis Date  . CVA (cerebral vascular accident) ([place])   . Polycystic disease, ovaries   . Seizures ([place])   . Sickle cell trait [facility])     Past Surgical History:  Procedure Laterality Date  . LAPAROSCOPIC CHOLECYSTECTOMY      There were no vitals filed for this visit.      Subjective Assessment - 05/02/17 1143    Subjective Pt told SLP she got frustrated and anxious re: homework.   Currently in Pain? Yes   Pain Score 10-Worst pain ever   Pain Location Head   Pain Orientation Mid   Pain Descriptors / Indicators Headache   Pain Type Acute pain   Pain Onset 1 to 4 weeks ago   Pain Frequency Constant   Aggravating Factors  light   Pain Relieving Factors darkness               ADULT SLP TREATMENT - 05/02/17 1144      General Information   Behavior/Cognition Alert;Pleasant mood;Cooperative  "I can feel anxiousness increase as we come here."     Treatment Provided   Treatment provided Cognitive-Linquistic     Cognitive-Linquistic Treatment   Treatment focused on Aphasia   Skilled Treatment Homework for similarity/difference - pt did not complete due to frustration and anxiety at home. In the session today  she req'd mod A from SLP usually. Also discussed pt anxiety and frustration with homework and pt shared it also feels like this when out and about. Given pt's  anxiety level and her frustration re: homework and her speech in general, SLP suggested talking to physiatrist or PCP re: possibility of medication management. Pt tells SLP she sees words backwards or upside down at times, or "it's just a bunch of words at times" as well. SLP to modify goal/s for reading comprehension. SLP told pt to continue to read the Bible aloud and record, then play back to monitor for errors and to assist in comprehension.     Assessment / Recommendations / Plan   Plan Continue with current plan of care     Progression Toward Goals   Progression toward goals --  progress in ST is hindered largely by pt's anxiety/frustrati          SLP Education - 05/02/17 1410    Education provided Yes   Education Details inquire of PCP or physiatrist re: frustration and anxiety   Person(s) Educated Patient   Methods Explanation   Comprehension Verbalized understanding          SLP Short Term Goals - 04/27/17 1203      SLP SHORT TERM GOAL #1   Title Pt will tell four ways to compensate for anomia   Status Achieved     SLP SHORT TERM GOAL #2   Title pt will complete mod complex  naming tasks independently with 90% success over three sessions   Time 1   Status Not Met     SLP SHORT TERM GOAL #3   Title pt will engage functionally in 10 minutes mod complex/complex conversation using compensatory strategies PRN over three sessions   Baseline 04-24-17, 04/27/17   Time 1   Period Weeks   Status Achieved          SLP Long Term Goals - 05/02/17 1419      SLP LONG TERM GOAL #1   Title pt will engage functionally in 15 minutes mostly complex conversation outside [place] room, using compensatory strategies PRN over three sessions   Time 3   Period Weeks   Status Revised     SLP LONG TERM GOAL #2   Title pt will talk with SLP outside [place] room for 10 mintues, using compensatory strategies for anomia x2 sessions   Time 3   Period Weeks   Status New     SLP LONG TERM GOAL #3    Title pt will read 2 sentence stimuli and answer questions with modified independence (compensations)   Time 3   Period Weeks   Status New          Plan - 05/02/17 1413    Clinical Impression Statement Word finding deficts in conversation cont evident in pt's conversational speech. Ultimately pt is able to recover from anomic episodes but not in a WNL timeframe. Word-finding in specific sentence tasks continues as well. Pt reports continued anxiety and frustration around speaking. SLP believes this hinders pt's rehabilitative ability, as homework and her social engagement suffer. Today SLP encouraged pt to contact PCP or physiatrist re: medical management of these detrimental processes (frustration/anxiety). Skilled ST is recommended for improving her verbal expression as close to PLOF as possible.    Speech Therapy Frequency 2x / week   Treatment/Interventions Language facilitation;Compensatory techniques;Internal/external aids;SLP instruction and feedback;Multimodal communcation approach;Patient/family education;Functional tasks   Potential to Achieve Goals Good   SLP Home Exercise Plan reading aloud, writing bible interpretations   Consulted and Agree with Plan of Care Patient      Patient will benefit from skilled therapeutic intervention in order to improve the following deficits and impairments:   Aphasia    Problem List Patient Active Problem List   Diagnosis Date Noted  . Hemiparesis affecting right side as late effect of cerebrovascular accident ([place]) 03/23/2017  . Lacunar infarct, acute ([place]) 03/23/2017  . Visual disturbance as complication of stroke 25/85/2778  . Adjustment disorder with anxious mood   . 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 Sickle cell trait ([place])   . Hyperlipidemia   . Thyroid nodule   . Changes in vision   . Acute ischemic stroke ([place])   . Dysphagia, post-stroke   . Benign essential HTN   . Polycystic kidney  disease   . Prediabetes   . Acute intractable headache   . Leukocytosis   . Stroke (cerebrum) ([place]) 02/25/2017    [place] ,[place], [place]  05/02/2017, 2:24 PM  [facility] [address] [place], [place], [address] Phone: [phone]   Fax:  [phone]   Name: AELYN STANALAND MRN: [id number] Date of Birth: 1976/01/21

## 2017-05-02 NOTE — Patient Instructions (Signed)
  Please complete the assigned speech therapy homework prior to your next session and return it to the speech therapist at your next visit.  

## 2017-05-04 ENCOUNTER — Ambulatory Visit: Payer: Medicare Other | Admitting: Occupational Therapy

## 2017-05-04 ENCOUNTER — Ambulatory Visit: Payer: Medicare Other | Admitting: Speech Pathology

## 2017-05-04 DIAGNOSIS — R278 Other lack of coordination: Secondary | ICD-10-CM | POA: Diagnosis not present

## 2017-05-04 DIAGNOSIS — R41840 Attention and concentration deficit: Secondary | ICD-10-CM

## 2017-05-04 DIAGNOSIS — M6281 Muscle weakness (generalized): Secondary | ICD-10-CM | POA: Diagnosis not present

## 2017-05-04 DIAGNOSIS — R41842 Visuospatial deficit: Secondary | ICD-10-CM | POA: Diagnosis not present

## 2017-05-04 DIAGNOSIS — R2681 Unsteadiness on feet: Secondary | ICD-10-CM

## 2017-05-04 DIAGNOSIS — I69353 Hemiplegia and hemiparesis following cerebral infarction affecting right non-dominant side: Secondary | ICD-10-CM

## 2017-05-04 DIAGNOSIS — R2689 Other abnormalities of gait and mobility: Secondary | ICD-10-CM | POA: Diagnosis not present

## 2017-05-04 DIAGNOSIS — R4701 Aphasia: Secondary | ICD-10-CM

## 2017-05-04 NOTE — Therapy (Signed)
[facility] [address] [place], [place], [address] Phone: [phone]   Fax:  [phone]  Speech Language Pathology Treatment  Patient Details  Name: Sharon Chan MRN: [id number] Date of Birth: 04/15/1976 No Data Recorded  Encounter Date: 05/04/2017      End of Session - 05/04/17 1221    Visit Number 8   Number of Visits 17   Date for SLP Re-Evaluation 06/02/17   SLP Start Time 0847   SLP Stop Time  0929   SLP Time Calculation (min) 42 min   Activity Tolerance Patient tolerated treatment well      Past Medical History:  Diagnosis Date  . CVA (cerebral vascular accident) ([place])   . Polycystic disease, ovaries   . Seizures ([place])   . Sickle cell trait [facility])     Past Surgical History:  Procedure Laterality Date  . LAPAROSCOPIC CHOLECYSTECTOMY      There were no vitals filed for this visit.      Subjective Assessment - 05/04/17 0852    Subjective "Im hard on my self  I hear all of my mistakes and I can't correct them always"               ADULT SLP TREATMENT - 05/04/17 0855      General Information   Behavior/Cognition Alert;Pleasant mood;Cooperative     Cognitive-Linquistic Treatment   Treatment focused on Aphasia   Skilled Treatment Complex naming homework completed with 95% accuracy. Pt reporting difficulty doing 2 things at once, such as getting dressed with the TV on, and difficulty comprehending her mom in the car with the radio on. Educated pt on reducing background noise. Utilized reading focus card for reading comprehension of functional (menus, business schedules,)materials and simple functional time and money math word problems as pt reports difficulty with simple math and numbers due to aphasia. Pt required extended time and rare min A for simple math word problems. Pt reports difficulty reading menus, emails and texts, trained pt in how to set andn and use text to speech on her  iphone. "This will really be helpful" she stated     Assessment / Recommendations / [place] with current plan of care     Progression Toward Goals   Progression toward goals Progressing toward goals          SLP Education - 05/04/17 1220    Education provided Yes   Education Details use text to speech on iphone for emails, menus, texting   Person(s) Educated Patient   Methods Explanation;Handout   Comprehension Verbalized understanding          SLP Short Term Goals - 05/04/17 1221      SLP SHORT TERM GOAL #1   Title Pt will tell four ways to compensate for anomia   Status Achieved     SLP SHORT TERM GOAL #2   Title pt will complete mod complex naming tasks independently with 90% success over three sessions   Time 1   Status Not Met     SLP SHORT TERM GOAL #3   Title pt will engage functionally in 10 minutes mod complex/complex conversation using compensatory strategies PRN over three sessions   Baseline 04-24-17, 04/27/17   Time 1   Period Weeks   Status Achieved          SLP Long Term Goals - 05/04/17 1221      SLP LONG TERM GOAL #1  Title pt will engage functionally in 15 minutes mostly complex conversation outside [place] room, using compensatory strategies PRN over three sessions   Time 3   Period Weeks   Status Revised     SLP LONG TERM GOAL #2   Title pt will talk with SLP outside [place] room for 10 mintues, using compensatory strategies for anomia x2 sessions   Time 3   Period Weeks   Status New     SLP LONG TERM GOAL #3   Title pt will read 2 sentence stimuli and answer questions with modified independence (compensations)   Time 3   Period Weeks   Status New          Plan - 05/04/17 1220    Clinical Impression Statement Word finding deficts in conversation cont evident in pt's conversational speech. Ultimately pt is able to recover from anomic episodes but not in a WNL timeframe. Word-finding in specific sentence tasks continues as  well. Pt reports continued anxiety and frustration around speaking. SLP believes this hinders pt's rehabilitative ability, as homework and her social engagement suffer. Today SLP encouraged pt to contact PCP or physiatrist re: medical management of these detrimental processes (frustration/anxiety). Skilled ST is recommended for improving her verbal expression as close to PLOF as possible.    Speech Therapy Frequency 2x / week   Treatment/Interventions Language facilitation;Compensatory techniques;Internal/external aids;SLP instruction and feedback;Multimodal communcation approach;Patient/family education;Functional tasks   Potential to Achieve Goals Good   SLP Home Exercise Plan reading aloud, writing bible interpretations   Consulted and Agree with Plan of Care Patient      Patient will benefit from skilled therapeutic intervention in order to improve the following deficits and impairments:   Aphasia    Problem List Patient Active Problem List   Diagnosis Date Noted  . Hemiparesis affecting right side as late effect of cerebrovascular accident ([place]) 03/23/2017  . Lacunar infarct, acute ([place]) 03/23/2017  . Visual disturbance as complication of stroke 68/61/6837  . Adjustment disorder with anxious mood   . 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 Sickle cell trait ([place])   . Hyperlipidemia   . Thyroid nodule   . Changes in vision   . Acute ischemic stroke ([place])   . Dysphagia, post-stroke   . Benign essential HTN   . Polycystic kidney disease   . Prediabetes   . Acute intractable headache   . Leukocytosis   . Stroke (cerebrum) ([place]) 02/25/2017    Sharon Chan, Sharon Rusk MS, [name] 05/04/2017, 12:22 PM  [facility] [address] [place], [place], [address] Phone: [phone]   Fax:  [phone]   Name: Sharon Chan MRN: [id number] Date of Birth: 1975/12/04

## 2017-05-04 NOTE — Therapy (Signed)
[facility] [address] [place], [place], [address] Phone: [phone]   Fax:  [phone]  Occupational Therapy Treatment  Patient Details  Name: Sharon Chan MRN: [id number] Date of Birth: 1975/12/24 Referring Provider: Dr. Alysia Penna  Encounter Date: 05/04/2017      OT End of Session - 05/04/17 1219    Visit Number 17   Number of Visits 21   Date for OT Re-Evaluation 05/15/17   Authorization Type Medicare/Medicaid; g-code needed   Authorization - Visit Number 17   Authorization - Number of Visits 20   OT Start Time 4034   OT Stop Time 1100   OT Time Calculation (min) 45 min   Activity Tolerance Patient tolerated treatment well      Past Medical History:  Diagnosis Date  . CVA (cerebral vascular accident) ([place])   . Polycystic disease, ovaries   . Seizures ([place])   . Sickle cell trait [facility])     Past Surgical History:  Procedure Laterality Date  . LAPAROSCOPIC CHOLECYSTECTOMY      There were no vitals filed for this visit.      Subjective Assessment - 05/04/17 1023    Subjective  I took my headache medicine today but it's still bad (denies any other symptoms). I still fall a lot, in fact I had several falls yesterday, but haven't had any in the shower since May"   Pertinent History L CVA 02/25/17; HTN, polycystic kidney disease, hyperlipidemia, sickle cell trait, scans showed 2 old CVAs, sickle cell trait, prediabetes    Limitations no driving, fall risk, visual deficits   Currently in Pain? Yes   Pain Score 10-Worst pain ever   Pain Location Head  and neck   Pain Orientation Posterior;Left   Pain Descriptors / Indicators Headache   Pain Onset 1 to 4 weeks ago   Pain Frequency Constant   Aggravating Factors  light   Pain Relieving Factors darkness                      OT Treatments/Exercises (OP) - 05/04/17 0001      ADLs   UB Dressing Pt reports still unable to  hook buttons of shirt, therefore demo use of button hook. Pt return demo of button hook and really liked it. Provided handout and told where to purchase   LB Dressing Practiced tying shoes, however had max difficulty due decr. sensation and mild apraxia Rt hand, as well as laces were short. Recommended practicing with stool and longer laces (pt to purchase longer laces). Pt also shown alternatives to tying including: shoe buttons, spyro laces, lack lock, and velcro converters. Pt provided handout and told where/how to purchase. Practiced use of shoe buttons. Therapist recommended still practicing tying with stool and long laces, but may have one pair of shoes with shoe buttons to use when in a hurry or fatigued.    ADL Comments Discussed fall prevention/safety techniques when performing LB dressing including: donning to hips while seated, then making sure she has body leaning against stable surface OR stable surface for one hand support while donning/doffing over hips. Pt reports she fell once while performing LB dressing yesterday. Also gave brochure for [employer] and recommended researching (along with ADT and [employer]) d/t high falls and mother gradually reducing time spent with her.      Visual/Perceptual Exercises   Scanning - Environmental practiced environmental scanning in minimally distracting environment with category generation for simple divided  attention component. Pt id 6/10 items in environment (60% accuracy) on 1st trial, id 3/4 remaining items on 2nd trial                  OT Short Term Goals - 04/13/17 0809      OT SHORT TERM GOAL #1   Title Pt will be independent with initial HEP.--check STGs 04/15/17   Time 4   Period Weeks   Status Achieved  04/06/17  met     OT SHORT TERM GOAL #2   Title Pt will improve R hand coordination/functional reaching as shown by improving score on box and blocks test by at least 10.   Baseline R-28blocks, L-52blocks   Time 4   Period Weeks    Status Achieved  04/13/17:  38 blocks     OT SHORT TERM GOAL #3   Title Pt will improve R hand coordination for ADLs as shown by improving time on 9-hole peg test by at least 10sec.   Baseline R-57.19sec   Time 4   Period Weeks   Status Achieved  04/13/17:  35.94sec     OT SHORT TERM GOAL #4   Title Pt will perform tabletop visual scanning with at least 95% accuracy with incr time.   Time 4   Period Weeks   Status Not Met  04/13/17:  inconsistent approx 78% with reading guide     OT SHORT TERM GOAL #5   Title Pt will verbalize understanding of memory/visual compensation strategies.   Time 4   Period Weeks   Status Achieved  04/06/17 met     OT SHORT TERM GOAL #6   Title Pt will improve R grip strength by at least 8lbs to assist with ADLs.   Baseline R-18lbs   Time 4   Period Weeks   Status Achieved  04/13/17:  28lbs     OT SHORT TERM GOAL #7   Title Pt will perform dressing/bathing without falls x 2 weeks utilizing safety strategies (supervision).   Time 4   Period Weeks   Status On-going  04/13/17:  last fall in shower 04/04/17           OT Long Term Goals - 04/24/17 1308      OT LONG TERM GOAL #1   Title Pt will be independent with updated HEP.--check LTGs 05/15/17   Time 8   Period Weeks   Status New     OT LONG TERM GOAL #2   Title Pt will perform simple cooking/home maintenance tasks mod I.   Time 8   Period Weeks   Status New     OT LONG TERM GOAL #3   Title Pt will improve coordination for ADLs as shown by improving time on 9-hole peg test by at least 20sec with RUE.--revised 04/13/17:  completing test in 28sec or less   Baseline R-57.19sec   Time 8   Period Weeks   Status Revised  04/13/17:  met initial goal and revised goal (35.94sec)     OT LONG TERM GOAL #4   Title Pt will perform environmental scanning with at least 90% accuracy for incr safety in the community.   Time 8   Period Weeks   Status New     OT LONG TERM GOAL #5   Title Pt will  improve R grip strength by at least 15lbs to assist with ADLs.   Baseline R-18lbs   Time 8   Period Weeks   Status New  OT LONG TERM GOAL #6   Title Pt will be able to retrieve 3lb object from overhead shelf x3 safely with RUE.   Time 8   Period Weeks   Status New               Plan - 05/04/17 1220    Clinical Impression Statement Pt still having falls and constant headache. Pt also limited by decreased sensation Rt hand. Divided attention remains difficult   Rehab Potential Good   Current Impairments/barriers affecting progress: decr sensation, fall risk   OT Frequency 2x / week   OT Duration 8 weeks   OT Treatment/Interventions Self-care/ADL training;Moist Heat;Fluidtherapy;DME and/or AE instruction;Splinting;Patient/family education;Balance training;Therapeutic exercises;Contrast Bath;Ultrasound;Therapeutic exercise;Therapeutic activities;Cognitive remediation/compensation;Passive range of motion;Functional Mobility Training;Neuromuscular education;Cryotherapy;Electrical Stimulation;Parrafin;Energy conservation;Manual Therapy;Visual/perceptual remediation/compensation   Plan continue progress towards unmet STG's and LTG's   OT Home Exercise Plan Education provided:  03/28/17 coordination HEP, red putty HEP , red theraband issued 03/30/17. A/E recommendations, [employer] recommendations 05/04/17.       Patient will benefit from skilled therapeutic intervention in order to improve the following deficits and impairments:  Decreased coordination, Impaired sensation, Decreased range of motion, Decreased safety awareness, Decreased activity tolerance, Decreased balance, Decreased knowledge of use of DME, Impaired UE functional use, Impaired vision/preception, Impaired perceived functional ability, Decreased strength, Decreased mobility, Decreased cognition  Visit Diagnosis: Hemiplegia and hemiparesis following cerebral infarction affecting right non-dominant side (HCC)  Other lack of  coordination  Unsteadiness on feet  Visuospatial deficit  Attention and concentration deficit    Problem List Patient Active Problem List   Diagnosis Date Noted  . Hemiparesis affecting right side as late effect of cerebrovascular accident ([place]) 03/23/2017  . Lacunar infarct, acute ([place]) 03/23/2017  . Visual disturbance as complication of stroke 09/62/8366  . Adjustment disorder with anxious mood   . 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 Sickle cell trait ([place])   . Hyperlipidemia   . Thyroid nodule   . Changes in vision   . Acute ischemic stroke ([place])   . Dysphagia, post-stroke   . Benign essential HTN   . Polycystic kidney disease   . Prediabetes   . Acute intractable headache   . Leukocytosis   . Stroke (cerebrum) ([place]) 02/25/2017    Carey Bullocks, OTR/L 05/04/2017, 12:24 PM  [facility] [address] [place], [place], [address] Phone: [phone]   Fax:  [phone]  Name: STEPHANIEMARIE STOFFEL MRN: [id number] Date of Birth: 01/31/1976

## 2017-05-04 NOTE — Patient Instructions (Addendum)
  Read any materials you can beforehand - menus, church bulletin, Bible passages, etc  Write down phrases that you may need ahead of time ie: "excuse me"  "peace be with you" prayers, "how are you"  Or if you are in a situation and can't think of a repetitive phrase you need, have your mom or friend write it for you  Write down questions for your physician a head of time - can use notes in your phone or on paper  Waze direction app for mom  Text to voice - Settings - General - Accessibility - Speech -  Turn on Speak Screen and Speak Selection and Highlight text   Use the Turtle or Rabbit to speed up or down - you have to get used to skipping non important information  DO NOT TURN ON VOICE OVER

## 2017-05-05 ENCOUNTER — Ambulatory Visit: Payer: Medicare Other | Admitting: Rehabilitative and Restorative Service Providers"

## 2017-05-05 DIAGNOSIS — R2689 Other abnormalities of gait and mobility: Secondary | ICD-10-CM

## 2017-05-05 DIAGNOSIS — M6281 Muscle weakness (generalized): Secondary | ICD-10-CM | POA: Diagnosis not present

## 2017-05-05 DIAGNOSIS — R41842 Visuospatial deficit: Secondary | ICD-10-CM | POA: Diagnosis not present

## 2017-05-05 DIAGNOSIS — R2681 Unsteadiness on feet: Secondary | ICD-10-CM | POA: Diagnosis not present

## 2017-05-05 DIAGNOSIS — R278 Other lack of coordination: Secondary | ICD-10-CM | POA: Diagnosis not present

## 2017-05-05 DIAGNOSIS — I69353 Hemiplegia and hemiparesis following cerebral infarction affecting right non-dominant side: Secondary | ICD-10-CM | POA: Diagnosis not present

## 2017-05-05 NOTE — Therapy (Signed)
[facility] [address] [place], [place], [address] Phone: [phone]   Fax:  [phone]  Physical Therapy Treatment  Patient Details  Name: Sharon Chan MRN: [id number] Date of Birth: [birth year] Referring Provider: Alysia Penna, MD  Encounter Date: 05/05/2017      PT End of Session - 05/05/17 0954    Visit Number 13   Number of Visits 18   Date for PT Re-Evaluation 05/15/17   Authorization Type G code every 10th visit   PT Start Time [phone]   PT Stop Time 1023   PT Time Calculation (min) 40 min   Activity Tolerance Patient tolerated treatment well   Behavior During Therapy [facility] for tasks assessed/performed      Past Medical History:  Diagnosis Date  . CVA (cerebral vascular accident) ([place])   . Polycystic disease, ovaries   . Seizures ([place])   . Sickle cell trait [facility])     Past Surgical History:  Procedure Laterality Date  . LAPAROSCOPIC CHOLECYSTECTOMY      There were no vitals filed for this visit.      Subjective Assessment - 05/05/17 0946    Subjective The patient reports that she had 2 falls this morning, 2 falls in the shower yesterday.  She notes she does not bruise from falls.  She feels that distractions, multi-tasking, and poor nutrition contribute to falls.  She reports that she is continuing with severe headache.   She notes her niece tried to commit suicide and this week has been tough emotionally.  She also reports frustration that right side does not have feeling/sensation.    The patient notes that walking has significantly improved, but still depends on left due to sensory changes.   Pertinent History sickle cell, polycystic ovarian disease, obesity, thyroid nodule   Patient Stated Goals "To get back to a sense of normalcy", improve sensation in her right side and be independent.   Currently in Pain? Yes   Pain Score 10-Worst pain ever   Pain Location Head   Pain Descriptors /  Indicators Headache   Pain Type Acute pain   Pain Onset More than a month ago   Pain Frequency Constant   Aggravating Factors  unsure   Pain Relieving Factors unsure                         [facility] Adult PT Treatment/Exercise - 05/05/17 1000      Ambulation/Gait   Ambulation/Gait Yes   Ambulation/Gait Assistance 6: Modified independent (Device/Increase time)   Ambulation/Gait Assistance Details with SPC x 500 ft indoors (patient wanted to warm up with cane due to falls this morning).   Ambulation Distance (Feet) 500 Feet   Assistive device None   Gait Comments Outdoor activities with paved surface inclines/declines without a device with supervision.  Patient's gait pattern appears antalgic in nature for right LE.     Neuro Re-ed    Neuro Re-ed Details  Alternating LE foot taps, "up/up down/down" for coordination x 10 times each leg.  Standing sidesepping with quick direction changes, heel/toe walking, backwards walking with dynamic changes for balance.  Tapping R LE laterally and anteriorly to various surfaces for speed/coordination.                   PT Short Term Goals - 04/17/17 1497      PT SHORT TERM GOAL #1   Title The patient will return demo HEP  for R LE strength, dynamic balance activities.   Baseline 04/14/17: met today with current HEP   Status Achieved     PT SHORT TERM GOAL #2   Title The patient will improve gait speed from 2 ft/sec to > or equal to 2.4 ft/sec to demo increasing functional mobility.   Baseline 04/14/17: 2.4 ft/sec with cane today   Status Achieved     PT SHORT TERM GOAL #3   Title The patient will improve FGA from 8/30 to > or equal to 12/30 to demo improving dynamic balance/gait.   Baseline scored 13/30 on FGA on 04/12/2017   Status Achieved     PT SHORT TERM GOAL #4   Title The patient will return demo stair negotiation with one handrail, reciprocal pattern, modified indep.   Baseline 02/10/17: The patient is able to  perform this mod indep, however expresses fear of falling and has slowed pace to perform.   Status Achieved           PT Long Term Goals - 04/24/17 1518      PT LONG TERM GOAL #1   Title The patient will be indep with progression of HEP.   Baseline Target date 05/15/2017   Time 8   Period Weeks     PT LONG TERM GOAL #2   Title The patient will improve Gait speed from 2 ft/sec to > or equal to 2.8 ft/sec to demo return to "full community ambulator" classification of gait.   Baseline Target date 05/15/2017   Time 8   Period Weeks     PT LONG TERM GOAL #3   Title The patient will improve FGA from 8/30 up to 15/30 to demo improving dynamic balance activities.   Baseline Target date 05/15/2017   Time 8   Period Weeks     PT LONG TERM GOAL #4   Title Improve neuro QOL LE from 44.4% up to 60% to demo improving self perception of disability.   Baseline Target date 05/15/2017   Time 8   Period Weeks     PT LONG TERM GOAL #5   Title The patient will improve R LE strength to 5/5.   Baseline Met on 04/24/17   Time 8   Period Weeks   Status Achieved               Plan - 05/05/17 1059    Clinical Impression Statement The patient is progressing towards LTGs.  She continues with falls that appear to be more related to emotional distress, distractions, and diminished sensation.  She reports a significant # of falls without any injury, therefore must have some control of movement.  PT anticipates renewing x 4 more weeks with emphasis on return to community walking program, considering community exercise program, and improving endurance.     PT Treatment/Interventions ADLs/Self Care Home Management;Therapeutic activities;Therapeutic exercise;Balance training;Neuromuscular re-education;Gait training;Stair training;Patient/family education;Functional mobility training;Orthotic Fit/Training   PT Next Visit Plan Community gait, dynamic balance/gait, endurance training.  Check LTGs.  PT to renew  x 4 more weeks.   Consulted and Agree with Plan of Care Patient      Patient will benefit from skilled therapeutic intervention in order to improve the following deficits and impairments:  Difficulty walking, Decreased activity tolerance, Decreased balance, Decreased mobility, Decreased strength, Impaired sensation  Visit Diagnosis: Other abnormalities of gait and mobility  Unsteadiness on feet  Muscle weakness (generalized)     Problem List Patient Active Problem List   Diagnosis  Date Noted  . Hemiparesis affecting right side as late effect of cerebrovascular accident ([place]) 03/23/2017  . Lacunar infarct, acute ([place]) 03/23/2017  . Visual disturbance as complication of stroke 43/15/4008  . Adjustment disorder with anxious mood   . 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 Sickle cell trait ([place])   . Hyperlipidemia   . Thyroid nodule   . Changes in vision   . Acute ischemic stroke ([place])   . Dysphagia, post-stroke   . Benign essential HTN   . Polycystic kidney disease   . Prediabetes   . Acute intractable headache   . Leukocytosis   . Stroke (cerebrum) ([place]) 02/25/2017    Markel Mergenthaler, PT 05/05/2017, 12:22 PM  [facility] [address] [place], [place], [address] Phone: [phone]   Fax:  [phone]  Name: Sharon Chan MRN: [id number] Date of Birth: May 21, 1976

## 2017-05-08 ENCOUNTER — Ambulatory Visit: Payer: Medicare Other | Admitting: Occupational Therapy

## 2017-05-08 ENCOUNTER — Ambulatory Visit: Payer: Medicare Other | Admitting: Physical Therapy

## 2017-05-08 ENCOUNTER — Encounter: Payer: Self-pay | Admitting: Physical Therapy

## 2017-05-08 DIAGNOSIS — R208 Other disturbances of skin sensation: Secondary | ICD-10-CM

## 2017-05-08 DIAGNOSIS — R2689 Other abnormalities of gait and mobility: Secondary | ICD-10-CM

## 2017-05-08 DIAGNOSIS — R41842 Visuospatial deficit: Secondary | ICD-10-CM | POA: Diagnosis not present

## 2017-05-08 DIAGNOSIS — I69353 Hemiplegia and hemiparesis following cerebral infarction affecting right non-dominant side: Secondary | ICD-10-CM

## 2017-05-08 DIAGNOSIS — R2681 Unsteadiness on feet: Secondary | ICD-10-CM

## 2017-05-08 DIAGNOSIS — R278 Other lack of coordination: Secondary | ICD-10-CM

## 2017-05-08 DIAGNOSIS — M6281 Muscle weakness (generalized): Secondary | ICD-10-CM | POA: Diagnosis not present

## 2017-05-08 DIAGNOSIS — I69319 Unspecified symptoms and signs involving cognitive functions following cerebral infarction: Secondary | ICD-10-CM

## 2017-05-08 DIAGNOSIS — R41840 Attention and concentration deficit: Secondary | ICD-10-CM

## 2017-05-08 NOTE — Patient Instructions (Signed)
Functional Quadriceps: Sit to Stand    Sit on edge of a lower chair, feet flat on floor. Stand upright without using hands, extending knees fully. Repeat _10_ times per set. Do _1_ sets per session. Do _1-2_ sessions per day.  http://orth.exer.us/735   Copyright  VHI. All rights reserved.   Hold onto counter or sturdy surface for balance with these: Heel Raises    Stand with support nearby. With knees straight, raise heels off ground with hands on hips. Hold __5_ seconds.  Repeat _10__ times. Do __1-2_ times a day.  Copyright  VHI. All rights reserved.   Toe Raise (Standing)    Stand with support behind you. Place hands on hips and rock back on heels raising toes. Hold for 5 seconds.  Repeat ___10_ times per set. Do __1__ sets per session. Do __1-2__ sessions per day.  http://orth.exer.us/43   Copyright  VHI. All rights reserved.  "I love a Parade" Lift    Slow, high knee marching forward and then backwards along counter top. Hold the knee in the air for up to 3 seconds with each march.  Repeat for 3 laps down and back. Do _1-2_ sessions per day.  http://gt2.exer.us/345   Copyright  VHI. All rights reserved.    Feet Heel-Toe "Tandem"    Arms as needed for balance, walk a straight line forward by bringing one foot directly in front of the other and then walk a straight line backwards by bringing one foot directly behind the other one.  Repeat for 3 laps forward/backward. Do _1-2_ sessions per day.  Copyright  VHI. All rights reserved.    Side-Stepping    Place red theraband above ankles. Walk to left side with eyes open. Take even steps, leading with same foot. Make sure each foot lifts off the floor and keep hips/feet facing the counter top, do not let them rotate.  Repeat in opposite direction. Perform 3 laps toward each side. 1-2 times a day.  Copyright  VHI. All rights reserved.

## 2017-05-08 NOTE — Therapy (Signed)
[facility] [address] [place], [place], [address] Phone: [phone]   Fax:  [phone]  Occupational Therapy Treatment  Patient Details  Name: Sharon Chan MRN: [id number] Date of Birth: 20-Feb-1976 Referring Provider: Dr. Alysia Penna  Encounter Date: 05/08/2017      OT End of Session - 05/08/17 1024    Visit Number 18   Number of Visits 21   Date for OT Re-Evaluation 05/15/17   Authorization Type Medicare/Medicaid; g-code needed   Authorization - Visit Number 18   Authorization - Number of Visits 20   OT Start Time 1020   OT Stop Time 1100   OT Time Calculation (min) 40 min   Activity Tolerance Patient tolerated treatment well   Behavior During Therapy [facility] for tasks assessed/performed      Past Medical History:  Diagnosis Date  . CVA (cerebral vascular accident) ([place])   . Polycystic disease, ovaries   . Seizures ([place])   . Sickle cell trait [facility])     Past Surgical History:  Procedure Laterality Date  . LAPAROSCOPIC CHOLECYSTECTOMY      There were no vitals filed for this visit.      Subjective Assessment - 05/08/17 1021    Subjective  Pt reports that niece attempted suicide last week and that her father committed suicide in 2003 so she feels like that's why she had so many falls last week.  Pt reports no falls over the weekend.     Pertinent History L CVA 02/25/17; HTN, polycystic kidney disease, hyperlipidemia, sickle cell trait, scans showed 2 old CVAs, sickle cell trait, prediabetes    Limitations no driving, fall risk, visual deficits   Currently in Pain? Yes   Pain Score 10-Worst pain ever   Pain Location --  head/neck   Pain Orientation --  headache   Pain Descriptors / Indicators --  headache   Pain Type Chronic pain   Pain Onset 1 to 4 weeks ago   Pain Frequency Constant   Aggravating Factors  light   Pain Relieving Factors darkness, peppermint oil          Picking  up blocks with gripper set on level 1 (black spring) for sustained grip strength with mod difficulty.  However, only demo 10lbs of grip strength with tested prior to activity and performance with activity improved with repetition.  Inconsistent performance noted ?due to sensation.  Pt reports that it is also easier to open bottles with R hand (asks for help less)  Placing small pegs in pegboard to copy small peg design with R hand for incr coordination/visual scanning with divided attention (conversation)--incr difficulty with conversation but pt able to self-correct without cueing (100% accuracy with incr time), min difficulty with cues.     24M visual scanning/number cancellation sheet with  Blue reading guide.  Pt completed with significantly incr time (with divided attention).  100% accuracy.    Reviewed recommendation for counseling for adjustment to CVA and for family issues that pt disclosed pt.  Pt reports plans to pursue.                        OT Short Term Goals - 05/08/17 1026      OT SHORT TERM GOAL #1   Title Pt will be independent with initial HEP.--check STGs 04/15/17   Time 4   Period Weeks   Status Achieved  04/06/17  met     OT SHORT  TERM GOAL #2   Title Pt will improve R hand coordination/functional reaching as shown by improving score on box and blocks test by at least 10.   Baseline R-28blocks, L-52blocks   Time 4   Period Weeks   Status Achieved  04/13/17:  38 blocks     OT SHORT TERM GOAL #3   Title Pt will improve R hand coordination for ADLs as shown by improving time on 9-hole peg test by at least 10sec.   Baseline R-57.19sec   Time 4   Period Weeks   Status Achieved  04/13/17:  35.94sec     OT SHORT TERM GOAL #4   Title Pt will perform tabletop visual scanning with at least 95% accuracy with incr time.   Time 4   Period Weeks   Status Achieved  04/13/17:  inconsistent approx 78% with reading guide.  05/08/17  with 100% accuracy with reading  guide     OT SHORT TERM GOAL #5   Title Pt will verbalize understanding of memory/visual compensation strategies.   Time 4   Period Weeks   Status Achieved  04/06/17 met     OT SHORT TERM GOAL #6   Title Pt will improve R grip strength by at least 8lbs to assist with ADLs.   Baseline R-18lbs   Time 4   Period Weeks   Status Achieved  04/13/17:  28lbs     OT SHORT TERM GOAL #7   Title Pt will perform dressing/bathing without falls x 2 weeks utilizing safety strategies (supervision).   Time 4   Period Weeks   Status Achieved  04/13/17:  last fall in shower 04/04/17,  met 05/08/17           OT Long Term Goals - 05/08/17 1030      OT LONG TERM GOAL #1   Title Pt will be independent with updated HEP.--check LTGs 05/15/17   Time 8   Period Weeks   Status New     OT LONG TERM GOAL #2   Title Pt will perform simple cooking/home maintenance tasks mod I.   Time 8   Period Weeks   Status New     OT LONG TERM GOAL #3   Title Pt will improve coordination for ADLs as shown by improving time on 9-hole peg test by at least 20sec with RUE.--revised 04/13/17:  completing test in 28sec or less   Baseline R-57.19sec   Time 8   Period Weeks   Status Revised  04/13/17:  met initial goal and revised goal (35.94sec)     OT LONG TERM GOAL #4   Title Pt will perform environmental scanning with at least 90% accuracy for incr safety in the community.   Time 8   Period Weeks   Status New     OT LONG TERM GOAL #5   Title Pt will improve R grip strength by at least 15lbs to assist with ADLs.   Baseline R-18lbs   Time 8   Period Weeks   Status New  05/08/17:  10lbs (however, not consistent with functional performance during session)     OT LONG TERM GOAL #6   Title Pt will be able to retrieve 3lb object from overhead shelf x3 safely with RUE.   Time 8   Period Weeks   Status New               Plan - 05/08/17 1429    Clinical Impression Statement Pt performance inconsistent with  vision, cognition, and strength/RUE functional use and appears to be affected by anxiety/emotions.  Pt reports that she plans to pursue counseling.     Rehab Potential Good   Current Impairments/barriers affecting progress: decr sensation, fall risk   OT Frequency 2x / week   OT Duration 8 weeks   OT Treatment/Interventions Self-care/ADL training;Moist Heat;Fluidtherapy;DME and/or AE instruction;Splinting;Patient/family education;Balance training;Therapeutic exercises;Contrast Bath;Ultrasound;Therapeutic exercise;Therapeutic activities;Cognitive remediation/compensation;Passive range of motion;Functional Mobility Training;Neuromuscular education;Cryotherapy;Electrical Stimulation;Parrafin;Energy conservation;Manual Therapy;Visual/perceptual remediation/compensation   Plan continue progress towards unmet STGs and LTGs and discuss possible renewal    OT Home Exercise Plan Education provided:  03/28/17 coordination HEP, red putty HEP , red theraband issued 03/30/17. A/E recommendations, Lif[employer] recommendations 05/04/17.    Consulted and Agree with Plan of Care Patient      Patient will benefit from skilled therapeutic intervention in order to improve the following deficits and impairments:  Decreased coordination, Impaired sensation, Decreased range of motion, Decreased safety awareness, Decreased activity tolerance, Decreased balance, Decreased knowledge of use of DME, Impaired UE functional use, Impaired vision/preception, Impaired perceived functional ability, Decreased strength, Decreased mobility, Decreased cognition  Visit Diagnosis: Hemiplegia and hemiparesis following cerebral infarction affecting right non-dominant side (HCC)  Other lack of coordination  Visuospatial deficit  Attention and concentration deficit  Muscle weakness (generalized)  Unsteadiness on feet  Other abnormalities of gait and mobility  Unspecified symptoms and signs involving cognitive functions following cerebral  infarction  Other disturbances of skin sensation    Problem List Patient Active Problem List   Diagnosis Date Noted  . Hemiparesis affecting right side as late effect of cerebrovascular accident ([place]) 03/23/2017  . Lacunar infarct, acute ([place]) 03/23/2017  . Visual disturbance as complication of stroke 25/42/7062  . Adjustment disorder with anxious mood   . 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 Sickle cell trait ([place])   . Hyperlipidemia   . Thyroid nodule   . Changes in vision   . Acute ischemic stroke ([place])   . Dysphagia, post-stroke   . Benign essential HTN   . Polycystic kidney disease   . Prediabetes   . Acute intractable headache   . Leukocytosis   . Stroke (cerebrum) ([place]) 02/25/2017    [facility] 05/08/2017, 2:35 PM  [facility] [address] [place], [place], [address] Phone: [phone]   Fax:  [phone]  Name: Sharon Chan MRN: [id number] Date of Birth: 09/16/1976   Vianne Bulls, OTR/L [facility] [address] [place], [place]  [phone] phone [phone] 05/08/17 2:35 PM

## 2017-05-08 NOTE — Therapy (Signed)
[facility] [address] [place], [place], [address] Phone: [phone]   Fax:  [phone]  Physical Therapy Treatment  Patient Details  Name: Sharon Chan MRN: [id number] Date of Birth: [birth year] Referring Provider: Alysia Penna, MD  Encounter Date: 05/08/2017      PT End of Session - 05/08/17 1036    Visit Number 14   Number of Visits 18   Date for PT Re-Evaluation 05/15/17   Authorization Type G code every 10th visit   PT Start Time 0931   PT Stop Time 1011   PT Time Calculation (min) 40 min   Activity Tolerance Patient tolerated treatment well   Behavior During Therapy [facility] for tasks assessed/performed      Past Medical History:  Diagnosis Date  . CVA (cerebral vascular accident) ([place])   . Polycystic disease, ovaries   . Seizures ([place])   . Sickle cell trait [facility])     Past Surgical History:  Procedure Laterality Date  . LAPAROSCOPIC CHOLECYSTECTOMY      There were no vitals filed for this visit.      Subjective Assessment - 05/08/17 0938    Subjective Pt reports no new complaints, no falls reported since last pt treatment. She has been walking outside more with her mom and reports it's easier to go up steps but is harder to coordinate going down steps.   Pertinent History sickle cell, polycystic ovarian disease, obesity, thyroid nodule   Patient Stated Goals "To get back to a sense of normalcy", improve sensation in her right side and be independent.   Currently in Pain? Yes   Pain Score 10-Worst pain ever   Pain Location Head   Pain Descriptors / Indicators Headache   Pain Type Chronic pain   Pain Onset More than a month ago   Pain Frequency Constant   Aggravating Factors  light   Pain Relieving Factors darkness         [facility] Adult PT Treatment/Exercise - 05/08/17 0001      Transfers   Sit to Stand 7: Independent   Five time sit to stand comments  performed x5; 4/5 without  using hands     Ambulation/Gait   Ambulation/Gait Yes   Ambulation/Gait Assistance 6: Modified independent (Device/Increase time)   Ambulation/Gait Assistance Details VC's for posture and step length   Ambulation Distance (Feet) 220 Feet   Assistive device Straight cane   Gait Pattern Step-through pattern;Antalgic;Decreased stance time - right   Ambulation Surface Level;Indoor   Gait velocity 3.1 ft/s     Exercises   Other Exercises  Reviewed and advanced exercises on pt.'s HEP with the following changes: pt performed toe raises x10, & heel raises x10 without hands for support,  high marches x3 laps down & back, tandem walking forward & backward x3; side stepping at counter x1 lap without resistance, and x3 laps with red theraband;seated hamstring stretch x30 seconds x2 each side                PT Short Term Goals - 05/08/17 1020      PT SHORT TERM GOAL #1   Title The patient will return demo HEP for R LE strength, dynamic balance activities.   Baseline 04/14/17: met today with current HEP   Time 4   Period Weeks   Status Achieved     PT SHORT TERM GOAL #2   Title The patient will improve gait speed from 2 ft/sec to > or  equal to 2.4 ft/sec to demo increasing functional mobility.   Baseline 04/14/17: 2.4 ft/sec with cane today   Time 4   Period Weeks   Status Achieved     PT SHORT TERM GOAL #3   Title The patient will improve FGA from 8/30 to > or equal to 12/30 to demo improving dynamic balance/gait.   Baseline scored 13/30 on FGA on 04/12/2017   Time 4   Period Weeks   Status Achieved     PT SHORT TERM GOAL #4   Title The patient will return demo stair negotiation with one handrail, reciprocal pattern, modified indep.   Baseline 02/10/17: The patient is able to perform this mod indep, however expresses fear of falling and has slowed pace to perform.   Time 4   Period Weeks   Status Achieved           PT Long Term Goals - 05/08/17 1021      PT LONG TERM GOAL #1    Title The patient will be indep with progression of HEP.   Baseline 05/08/17: updated today, needs cues   Time 8   Period Weeks   Status On-going     PT LONG TERM GOAL #2   Title The patient will improve Gait speed from 2 ft/sec to > or equal to 2.8 ft/sec to demo return to "full community ambulator" classification of gait.   Baseline Gait speed= 3.1 ft/sec   Time 8   Period Weeks   Status Achieved     PT LONG TERM GOAL #3   Title The patient will improve FGA from 8/30 up to 15/30 to demo improving dynamic balance activities.   Baseline Target date 05/15/2017   Time 8   Period Weeks   Status On-going     PT LONG TERM GOAL #4   Title Improve neuro QOL LE from 44.4% up to 60% to demo improving self perception of disability.   Baseline Target date 05/15/2017   Time 8   Period Weeks   Status On-going     PT LONG TERM GOAL #5   Title The patient will improve R LE strength to 5/5.   Baseline Met on 04/24/17   Time 8   Period Weeks   Status Achieved            Plan - 05/08/17 1013    Clinical Impression Statement Patient is progressing well towards LTG's with 2 goals met, one on going as HEP continues to need updating as pt progresses and one goal left to check.  She demonstrated improved balance with sit to stands, toe/heel raises, and side walks on HEP. Advanced current HEP today as needed. See pt instructions. Pt should benefit from continued PT to addresses endurance, high level balance and LE strengthening.   Clinical Presentation Evolving   Rehab Potential Good   PT Frequency 3x / week   PT Duration 4 weeks   PT Treatment/Interventions ADLs/Self Care Home Management;Therapeutic activities;Therapeutic exercise;Balance training;Neuromuscular re-education;Gait training;Stair training;Patient/family education;Functional mobility training;Orthotic Fit/Training   PT Next Visit Plan Perform FGA, primary PT plans to renew for 4 more weeks.    PT Home Exercise Plan Updated today.    Consulted and Agree with Plan of Care Patient      Patient will benefit from skilled therapeutic intervention in order to improve the following deficits and impairments:  Difficulty walking, Decreased activity tolerance, Decreased balance, Decreased mobility, Decreased strength, Impaired sensation  Visit Diagnosis: Other abnormalities of gait  and mobility  Unsteadiness on feet  Muscle weakness (generalized)     Problem List Patient Active Problem List   Diagnosis Date Noted  . Hemiparesis affecting right side as late effect of cerebrovascular accident ([place]) 03/23/2017  . Lacunar infarct, acute ([place]) 03/23/2017  . Visual disturbance as complication of stroke 67/67/2094  . Adjustment disorder with anxious mood   . 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 Sickle cell trait ([place])   . Hyperlipidemia   . Thyroid nodule   . Changes in vision   . Acute ischemic stroke ([place])   . Dysphagia, post-stroke   . Benign essential HTN   . Polycystic kidney disease   . Prediabetes   . Acute intractable headache   . Leukocytosis   . Stroke (cerebrum) ([place]) 02/25/2017    Rulon Eisenmenger 05/08/2017, 2:37 PM  [facility] [address] [place], [place], [address] Phone: [phone]   Fax:  [phone]  Name: JAILYNE CHIEFFO MRN: [id number] Date of Birth: 1976-05-23  This note has been reviewed and edited by supervising CI.  Willow Ora, PTA, [facility] [address], [place], [place] [address] [phone] 05/08/17, 11:42 PM

## 2017-05-11 ENCOUNTER — Ambulatory Visit: Payer: Medicare Other | Admitting: Occupational Therapy

## 2017-05-11 DIAGNOSIS — M6281 Muscle weakness (generalized): Secondary | ICD-10-CM

## 2017-05-11 DIAGNOSIS — R41842 Visuospatial deficit: Secondary | ICD-10-CM | POA: Diagnosis not present

## 2017-05-11 DIAGNOSIS — R208 Other disturbances of skin sensation: Secondary | ICD-10-CM

## 2017-05-11 DIAGNOSIS — R2681 Unsteadiness on feet: Secondary | ICD-10-CM

## 2017-05-11 DIAGNOSIS — I69353 Hemiplegia and hemiparesis following cerebral infarction affecting right non-dominant side: Secondary | ICD-10-CM | POA: Diagnosis not present

## 2017-05-11 DIAGNOSIS — R2689 Other abnormalities of gait and mobility: Secondary | ICD-10-CM | POA: Diagnosis not present

## 2017-05-11 DIAGNOSIS — I69319 Unspecified symptoms and signs involving cognitive functions following cerebral infarction: Secondary | ICD-10-CM

## 2017-05-11 DIAGNOSIS — R278 Other lack of coordination: Secondary | ICD-10-CM | POA: Diagnosis not present

## 2017-05-11 DIAGNOSIS — R41840 Attention and concentration deficit: Secondary | ICD-10-CM

## 2017-05-11 NOTE — Therapy (Signed)
[facility] [address] [place], [place], [address] Phone: [phone]   Fax:  [phone]  Occupational Therapy Treatment  Patient Details  Name: Sharon Chan MRN: [id number] Date of Birth: 21-Apr-1976 Referring Provider: Dr. Alysia Penna  Encounter Date: 05/11/2017      OT End of Session - 05/11/17 0941    Visit Number 19   Number of Visits 27  19+8=27   Date for OT Re-Evaluation 06/16/17   Authorization Type Medicare/Medicaid; g-code needed   Authorization Time Period renewal completed 04/29/06 with cert. date 05/11/17-07/09/17   Authorization - Visit Number 1  G code completed at renewal 05/11/17   Authorization - Number of Visits 10   OT Start Time [phone]   OT Stop Time 1015   OT Time Calculation (min) 39 min   Activity Tolerance Patient tolerated treatment well   Behavior During Therapy [facility] for tasks assessed/performed      Past Medical History:  Diagnosis Date  . CVA (cerebral vascular accident) ([place])   . Polycystic disease, ovaries   . Seizures ([place])   . Sickle cell trait [facility])     Past Surgical History:  Procedure Laterality Date  . LAPAROSCOPIC CHOLECYSTECTOMY      There were no vitals filed for this visit.      Subjective Assessment - 05/11/17 0937    Subjective  No falls since last session.  Pt reports that she is doing more at home, but continues to demo trouble squeezing bottles/tweezers, etc.   Pertinent History L CVA 02/25/17; HTN, polycystic kidney disease, hyperlipidemia, sickle cell trait, scans showed 2 old CVAs, sickle cell trait, prediabetes    Limitations no driving, fall risk, visual deficits   Patient Stated Goals improve L hand strength/functional use, be able to chop/peel   Currently in Pain? Yes   Pain Score 8    Pain Location --  head and neck   Pain Orientation --  headache   Pain Descriptors / Indicators --  headache   Pain Type Chronic pain   Pain Onset 1 to  4 weeks ago   Pain Frequency Constant   Aggravating Factors  light   Pain Relieving Factors darkness, peppermint oil       Checked remaining goals and discussed progress and plans to renew.  Pt verbalized understanding/agreement with POC.--see below for updates to goals.  Pt able to safely retrieve/replace 3lb object in overhead shelf and on bottom shelf of lower cabinet.  However, incr difficulty noted with bending down.  Environmental scanning in min distracting environment with 100% accuracy (with ambulation), min cueing provided to incr wt. Shift to R while walking with divided attention.  Updated putty HEP to green and pt returned demo.      In standing functional reaching incorporating wt. Shift and  reaching outside base of support for incr activity tolerance, coordination, balance and strength as pt retrieved/replace clothespins with 1-8lb resistance on vertical pole.           OT Education - 05/11/17 1506    Education Details Updated putty HEP to green   Person(s) Educated Patient   Methods Explanation;Demonstration   Comprehension Verbalized understanding;Returned demonstration          OT Short Term Goals - 05/08/17 1026      OT SHORT TERM GOAL #1   Title Pt will be independent with initial HEP.--check STGs 04/15/17   Time 4   Period Weeks   Status Achieved  04/06/17  met     OT SHORT TERM GOAL #2   Title Pt will improve R hand coordination/functional reaching as shown by improving score on box and blocks test by at least 10.   Baseline R-28blocks, L-52blocks   Time 4   Period Weeks   Status Achieved  04/13/17:  38 blocks     OT SHORT TERM GOAL #3   Title Pt will improve R hand coordination for ADLs as shown by improving time on 9-hole peg test by at least 10sec.   Baseline R-57.19sec   Time 4   Period Weeks   Status Achieved  04/13/17:  35.94sec     OT SHORT TERM GOAL #4   Title Pt will perform tabletop visual scanning with at least 95% accuracy  with incr time.   Time 4   Period Weeks   Status Achieved  04/13/17:  inconsistent approx 78% with reading guide.  05/08/17  with 100% accuracy with reading guide     OT SHORT TERM GOAL #5   Title Pt will verbalize understanding of memory/visual compensation strategies.   Time 4   Period Weeks   Status Achieved  04/06/17 met     OT SHORT TERM GOAL #6   Title Pt will improve R grip strength by at least 8lbs to assist with ADLs.   Baseline R-18lbs   Time 4   Period Weeks   Status Achieved  04/13/17:  28lbs     OT SHORT TERM GOAL #7   Title Pt will perform dressing/bathing without falls x 2 weeks utilizing safety strategies (supervision).   Time 4   Period Weeks   Status Achieved  04/13/17:  last fall in shower 04/04/17,  met 05/08/17           OT Long Term Goals - 05/11/17 0946      OT LONG TERM GOAL #1   Title Pt will be independent with updated HEP.--check LTGs 05/15/17   Time 8   Period Weeks   Status On-going  05/11/17  needs additional updates.     OT LONG TERM GOAL #2   Title Pt will perform simple cooking/home maintenance tasks mod I.--met initial goal, update 05/11/17 to mod complex cooking/home maintenance tasks mod I (including peeling and chopping).   Time 8   Period Weeks   Status Revised  05/11/17:  met and updated.     OT LONG TERM GOAL #3   Title Pt will improve coordination for ADLs as shown by improving time on 9-hole peg test by at least 20sec with RUE.--revised 04/13/17:  completing test in 28sec or less   Baseline R-57.19sec   Time 8   Period Weeks   Status Revised  04/13/17:  met initial goal and revised goal (35.94sec).  05/11/17  not met  33.75sec     OT LONG TERM GOAL #4   Title Pt will perform environmental scanning with at least 90% accuracy for incr safety in the community.--Met with min distracting environment.  Update goal 05/11/17 to 90% accuracy in mod distracting environement   Time 8   Period Weeks   Status Revised  05/11/17:  met and  revised goal     OT LONG TERM GOAL #5   Title Pt will improve R grip strength by at least 15lbs to assist with ADLs.   Baseline R-18lbs   Time 8   Period Weeks   Status On-going  05/08/17:  10lbs (however, not consistent with functional performance during session).  05/11/17  10lbs     OT LONG TERM GOAL #6   Title Pt will be able to retrieve 3lb object from overhead shelf x3 safely with RUE.   Time 8   Period Weeks   Status Achieved  05/11/17               Plan - 05/11/17 1501    Clinical Impression Statement Pt has made progress with coordination, visual scanning, and ADL/IADL performance.  Pt continues to demo falls at times and balance seems to be affected by anxiety and divided attention.  Pt also continues to demo sensory deficits that impact functional performance as well.  Pt would benefit from additional occupational therapy to maximize safety, independence, and IADL performance.     Occupational performance deficits (Please refer to evaluation for details): ADL's;IADL's;Rest and Sleep;Leisure;Social Participation   Rehab Potential Good   Current Impairments/barriers affecting progress: decr sensation, fall risk   OT Frequency 2x / week   OT Duration 8 weeks   OT Treatment/Interventions Self-care/ADL training;Moist Heat;Fluidtherapy;DME and/or AE instruction;Splinting;Patient/family education;Balance training;Therapeutic exercises;Contrast Bath;Ultrasound;Therapeutic exercise;Therapeutic activities;Cognitive remediation/compensation;Passive range of motion;Functional Mobility Training;Neuromuscular education;Cryotherapy;Electrical Stimulation;Parrafin;Energy conservation;Manual Therapy;Visual/perceptual remediation/compensation   Plan renewal completed 05/11/17, continue remaining goals, RUE functional use and IADL performance   OT Home Exercise Plan Education provided:  03/28/17 coordination HEP, red putty HEP , red theraband issued 03/30/17. A/E recommendations, [employer]  recommendations 05/04/17.    Consulted and Agree with Plan of Care Patient      Patient will benefit from skilled therapeutic intervention in order to improve the following deficits and impairments:  Decreased coordination, Impaired sensation, Decreased range of motion, Decreased safety awareness, Decreased activity tolerance, Decreased balance, Decreased knowledge of use of DME, Impaired UE functional use, Impaired vision/preception, Impaired perceived functional ability, Decreased strength, Decreased mobility, Decreased cognition  Visit Diagnosis: Hemiplegia and hemiparesis following cerebral infarction affecting right non-dominant side (HCC)  Muscle weakness (generalized)  Unsteadiness on feet  Other abnormalities of gait and mobility  Other lack of coordination  Visuospatial deficit  Attention and concentration deficit  Unspecified symptoms and signs involving cognitive functions following cerebral infarction  Other disturbances of skin sensation    Problem List Patient Active Problem List   Diagnosis Date Noted  . Hemiparesis affecting right side as late effect of cerebrovascular accident ([place]) 03/23/2017  . Lacunar infarct, acute ([place]) 03/23/2017  . Visual disturbance as complication of stroke 48/18/5631  . Adjustment disorder with anxious mood   . 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 Sickle cell trait ([place])   . Hyperlipidemia   . Thyroid nodule   . Changes in vision   . Acute ischemic stroke ([place])   . Dysphagia, post-stroke   . Benign essential HTN   . Polycystic kidney disease   . Prediabetes   . Acute intractable headache   . Leukocytosis   . Stroke (cerebrum) ([place]) 02/25/2017    [facility] 05/11/2017, 3:07 PM  [facility] [address] [place], [place], [address] Phone: [phone]   Fax:  [phone]  Name: Sharon Chan MRN: [id number] Date  of Birth: Oct 31, 1976   Vianne Bulls, OTR/L [facility] [address] [place], [place]  [phone] phone [phone] 05/11/17 3:07 PM

## 2017-05-12 ENCOUNTER — Ambulatory Visit: Payer: Medicare Other | Admitting: Physical Therapy

## 2017-05-12 DIAGNOSIS — R2689 Other abnormalities of gait and mobility: Secondary | ICD-10-CM | POA: Diagnosis not present

## 2017-05-12 DIAGNOSIS — R2681 Unsteadiness on feet: Secondary | ICD-10-CM

## 2017-05-12 DIAGNOSIS — R278 Other lack of coordination: Secondary | ICD-10-CM | POA: Diagnosis not present

## 2017-05-12 DIAGNOSIS — R41842 Visuospatial deficit: Secondary | ICD-10-CM | POA: Diagnosis not present

## 2017-05-12 DIAGNOSIS — I69353 Hemiplegia and hemiparesis following cerebral infarction affecting right non-dominant side: Secondary | ICD-10-CM | POA: Diagnosis not present

## 2017-05-12 DIAGNOSIS — M6281 Muscle weakness (generalized): Secondary | ICD-10-CM | POA: Diagnosis not present

## 2017-05-12 NOTE — Therapy (Signed)
[facility] [address] [place], [place], [address] Phone: [phone]   Fax:  [phone]  Physical Therapy Treatment  Patient Details  Name: Sharon Chan MRN: [id number] Date of Birth: 1975/12/22 Referring Provider: Alysia Penna, MD  Encounter Date: 05/12/2017      PT End of Session - 05/12/17 1032    Visit Number 15   Number of Visits 18   Date for PT Re-Evaluation 05/15/17   Authorization Type G code every 10th visit   PT Start Time 0848   PT Stop Time 0930   PT Time Calculation (min) 42 min   Equipment Utilized During Treatment Gait belt   Activity Tolerance Patient tolerated treatment well   Behavior During Therapy [facility] for tasks assessed/performed      Past Medical History:  Diagnosis Date  . CVA (cerebral vascular accident) ([place])   . Polycystic disease, ovaries   . Seizures ([place])   . Sickle cell trait [facility])     Past Surgical History:  Procedure Laterality Date  . LAPAROSCOPIC CHOLECYSTECTOMY      There were no vitals filed for this visit.      Subjective Assessment - 05/12/17 1029    Subjective Pt reports no new complaints or falls.  Patient reported she is going to stop by Dr. Kenton Kingfisher' office to get in touch with him regarding her headaches.   Pertinent History sickle cell, polycystic ovarian disease, obesity, thyroid nodule   Patient Stated Goals "To get back to a sense of normalcy", improve sensation in her right side and be independent.   Currently in Pain? Yes   Pain Score 10-Worst pain ever   Pain Descriptors / Indicators Headache   Pain Type Chronic pain   Pain Onset 1 to 4 weeks ago   Pain Frequency Constant   Aggravating Factors  light   Pain Relieving Factors darkness            [facility] PT Assessment - 05/12/17 0946      Functional Gait  Assessment   Gait assessed  Yes   Gait Level Surface Walks 20 ft, slow speed, abnormal gait pattern, evidence for imbalance or  deviates 10-15 in outside of the 12 in walkway width. Requires more than 7 sec to ambulate 20 ft.   Change in Gait Speed Able to change speed, demonstrates mild gait deviations, deviates 6-10 in outside of the 12 in walkway width, or no gait deviations, unable to achieve a major change in velocity, or uses a change in velocity, or uses an assistive device.   Gait with Horizontal Head Turns Performs head turns smoothly with slight change in gait velocity (eg, minor disruption to smooth gait path), deviates 6-10 in outside 12 in walkway width, or uses an assistive device.   Gait with Vertical Head Turns Performs task with slight change in gait velocity (eg, minor disruption to smooth gait path), deviates 6 - 10 in outside 12 in walkway width or uses assistive device   Gait and Pivot Turn Turns slowly, requires verbal cueing, or requires several small steps to catch balance following turn and stop   Step Over Obstacle Is able to step over one shoe box (4.5 in total height) but must slow down and adjust steps to clear box safely. May require verbal cueing.   Gait with Narrow Base of Support Ambulates 7-9 steps.   Gait with Eyes Closed Walks 20 ft, uses assistive device, slower speed, mild gait deviations, deviates 6-10 in  outside 12 in walkway width. Ambulates 20 ft in less than 9 sec but greater than 7 sec.   Ambulating Backwards Walks 20 ft, uses assistive device, slower speed, mild gait deviations, deviates 6-10 in outside 12 in walkway width.   Steps Two feet to a stair, must use rail.   Total Score 16   FGA comment: 16/30          [facility] Adult PT Treatment/Exercise - 05/12/17 0948      High Level Balance   High Level Balance Activities Tandem walking;Marching forwards;Marching backwards;Other (comment)  Toe walking forward <>backwards; all trials x3 each   High Level Balance Comments Tandem walking, marching, and toe walking on alternating floor with compliant surface (blue mat); VC for posture,  center weight over feet. Alternating LE foot taps standing on floor x10, on compliant surface (blue mat) x10, cross taps on compliant surface x10; VC for posture, center weight over feet; 3 foam bubbles placed in semicircle around patient, performed SLS with reaches x6 each direction, VC for posture, center weight over feet; LOB x3; Standing DLS on wobble board in parallel bars, rocked forward <> backward with no UE assist, maintained balance 10/10, bilateral UE reaches 10/10 no LOB, alternating UE lateral reach with trunk rotation, eyes follow hand x10 R<>L, LOB x4, VC for posture, to integrate head movement with trunk rotation and balance.             PT Short Term Goals - 05/08/17 1020      PT SHORT TERM GOAL #1   Title The patient will return demo HEP for R LE strength, dynamic balance activities.   Baseline 04/14/17: met today with current HEP   Time 4   Period Weeks   Status Achieved     PT SHORT TERM GOAL #2   Title The patient will improve gait speed from 2 ft/sec to > or equal to 2.4 ft/sec to demo increasing functional mobility.   Baseline 04/14/17: 2.4 ft/sec with cane today   Time 4   Period Weeks   Status Achieved     PT SHORT TERM GOAL #3   Title The patient will improve FGA from 8/30 to > or equal to 12/30 to demo improving dynamic balance/gait.   Baseline scored 13/30 on FGA on 04/12/2017   Time 4   Period Weeks   Status Achieved     PT SHORT TERM GOAL #4   Title The patient will return demo stair negotiation with one handrail, reciprocal pattern, modified indep.   Baseline 02/10/17: The patient is able to perform this mod indep, however expresses fear of falling and has slowed pace to perform.   Time 4   Period Weeks   Status Achieved           PT Long Term Goals - 05/12/17 1033      PT LONG TERM GOAL #1   Title The patient will be indep with progression of HEP.   Baseline 05/08/17: updated today, needs cues   Period Weeks   Status On-going     PT LONG  TERM GOAL #2   Title The patient will improve Gait speed from 2 ft/sec to > or equal to 2.8 ft/sec to demo return to "full community ambulator" classification of gait.   Baseline Gait speed= 3.1 ft/sec   Time 8   Period Weeks   Status Achieved     PT LONG TERM GOAL #3   Title The patient will improve FGA from  8/30 up to 15/30 to demo improving dynamic balance activities.   Baseline 16/30 on 05/12/17   Time 8   Period Weeks   Status Achieved     PT LONG TERM GOAL #4   Title Improve neuro QOL LE from 44.4% up to 60% to demo improving self perception of disability.   Baseline Target date 05/15/2017   Time 8   Period Weeks   Status On-going     PT LONG TERM GOAL #5   Title The patient will improve R LE strength to 5/5.   Baseline Met on 04/24/17   Time 8   Period Weeks   Status Achieved           Plan - 05/12/17 1058    Clinical Impression Statement Patient continues to progress well towards LTG's.  She achieved LTG 3 today with an improved FGA score (16/30). She demonstrated improved balance in SLS on compliant surface and in high level balance activities. Patient will benefit from continued PT to address endurance, dyanamic balance challenges while walking, and LE strengthening.   Rehab Potential Good   PT Frequency 3x / week   PT Duration 4 weeks   PT Treatment/Interventions ADLs/Self Care Home Management;Therapeutic activities;Therapeutic exercise;Balance training;Neuromuscular re-education;Gait training;Stair training;Patient/family education;Functional mobility training;Orthotic Fit/Training   PT Next Visit Plan Primary PT plans to renew for 4 more weeks- PT to complete renewal. Focus on dynamic balance with gait: negotiating sudden stops, turns, navigating around obstacles, reciprocal stepping on stairs; and continue to challenge SLS   Consulted and Agree with Plan of Care Patient        Patient will benefit from skilled therapeutic intervention in order to improve the  following deficits and impairments:  Difficulty walking, Decreased activity tolerance, Decreased balance, Decreased mobility, Decreased strength, Impaired sensation  Visit Diagnosis: Hemiplegia and hemiparesis following cerebral infarction affecting right non-dominant side (HCC)  Muscle weakness (generalized)  Unsteadiness on feet     Problem List Patient Active Problem List   Diagnosis Date Noted  . Hemiparesis affecting right side as late effect of cerebrovascular accident ([place]) 03/23/2017  . Lacunar infarct, acute ([place]) 03/23/2017  . Visual disturbance as complication of stroke 37/85/8850  . Adjustment disorder with anxious mood   . 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 Sickle cell trait ([place])   . Hyperlipidemia   . Thyroid nodule   . Changes in vision   . Acute ischemic stroke ([place])   . Dysphagia, post-stroke   . Benign essential HTN   . Polycystic kidney disease   . Prediabetes   . Acute intractable headache   . Leukocytosis   . Stroke (cerebrum) [facility]) 02/25/2017    Rulon Eisenmenger, SPTA 05/12/2017, 11:01 AM  [facility] [address] [place], [place], [address] Phone: [phone]   Fax:  [phone]  Name: Sharon Chan MRN: [id number] Date of Birth: 04-11-1976  This note has been reviewed and edited by supervising CI.  Willow Ora, PTA, [facility] [address], [place], [place] [address] [phone] 05/13/17, 2:01 PM

## 2017-05-15 ENCOUNTER — Ambulatory Visit: Payer: Medicare Other | Admitting: Rehabilitative and Restorative Service Providers"

## 2017-05-15 ENCOUNTER — Ambulatory Visit: Payer: Medicare Other | Attending: Physical Medicine & Rehabilitation | Admitting: Occupational Therapy

## 2017-05-15 DIAGNOSIS — R2681 Unsteadiness on feet: Secondary | ICD-10-CM

## 2017-05-15 DIAGNOSIS — R1310 Dysphagia, unspecified: Secondary | ICD-10-CM | POA: Diagnosis not present

## 2017-05-15 DIAGNOSIS — R4701 Aphasia: Secondary | ICD-10-CM | POA: Insufficient documentation

## 2017-05-15 DIAGNOSIS — R41842 Visuospatial deficit: Secondary | ICD-10-CM

## 2017-05-15 DIAGNOSIS — R41841 Cognitive communication deficit: Secondary | ICD-10-CM | POA: Diagnosis not present

## 2017-05-15 DIAGNOSIS — R208 Other disturbances of skin sensation: Secondary | ICD-10-CM | POA: Diagnosis not present

## 2017-05-15 DIAGNOSIS — I69353 Hemiplegia and hemiparesis following cerebral infarction affecting right non-dominant side: Secondary | ICD-10-CM | POA: Insufficient documentation

## 2017-05-15 DIAGNOSIS — R41840 Attention and concentration deficit: Secondary | ICD-10-CM

## 2017-05-15 DIAGNOSIS — M6281 Muscle weakness (generalized): Secondary | ICD-10-CM | POA: Diagnosis not present

## 2017-05-15 DIAGNOSIS — I69319 Unspecified symptoms and signs involving cognitive functions following cerebral infarction: Secondary | ICD-10-CM | POA: Diagnosis not present

## 2017-05-15 DIAGNOSIS — R278 Other lack of coordination: Secondary | ICD-10-CM | POA: Diagnosis not present

## 2017-05-15 DIAGNOSIS — R2689 Other abnormalities of gait and mobility: Secondary | ICD-10-CM | POA: Diagnosis not present

## 2017-05-15 NOTE — Therapy (Signed)
[facility] [address] [place], [place], [address] Phone: [phone]   Fax:  [phone]  Physical Therapy Treatment  Patient Details  Name: Sharon Chan MRN: [id number] Date of Birth: 02-27-1976 Referring Provider: Alysia Penna, MD  Encounter Date: 05/15/2017      PT End of Session - 05/15/17 1616    Visit Number 16   Number of Visits 18   Date for PT Re-Evaluation 05/15/17   Authorization Type G code every 10th visit   PT Start Time 0935   PT Stop Time 1016   PT Time Calculation (min) 41 min   Equipment Utilized During Treatment Gait belt   Activity Tolerance Patient tolerated treatment well   Behavior During Therapy [facility] for tasks assessed/performed      Past Medical History:  Diagnosis Date  . CVA (cerebral vascular accident) ([place])   . Polycystic disease, ovaries   . Seizures ([place])   . Sickle cell trait [facility])     Past Surgical History:  Procedure Laterality Date  . LAPAROSCOPIC CHOLECYSTECTOMY      There were no vitals filed for this visit.      Subjective Assessment - 05/15/17 0939    Subjective The patient reports she has been walking more and has to give herself a "pep talk".  She notes that she picked friends up at the airport and has returned to walking in the park for short distances.  No falls since 6/20.  She reports she has not been able to get through to her doctor's office to further discuss nutrition, and other referrals.     Pertinent History sickle cell, polycystic ovarian disease, obesity, thyroid nodule   Patient Stated Goals "To get back to a sense of normalcy", improve sensation in her right side and be independent.   Currently in Pain? Yes   Pain Score 10-Worst pain ever   Pain Location Head   Pain Descriptors / Indicators Headache   Pain Onset More than a month ago   Pain Frequency Constant   Aggravating Factors  daily headaches                          [facility] Adult PT Treatment/Exercise - 05/15/17 0954      Ambulation/Gait   Ambulation/Gait Yes   Ambulation/Gait Assistance 6: Modified independent (Device/Increase time)   Ambulation/Gait Assistance Details Patient ambulated into clinic with SPC modified indep, then ambulated without device x 600 ft outdoor surfaces with close supervision.  Negotiated grass, gravel, hills with close supervision and no loss of balance.     Ambulation Distance (Feet) 600 Feet   Assistive device Straight cane;None   Ambulation Surface Level;Unlevel;Indoor;Outdoor   Gait velocity 2.55 ft/sec   Stairs Yes   Stairs Assistance 6: Modified independent (Device/Increase time);5: Supervision   Stair Management Technique Alternating pattern;No rails   Number of Stairs 12   Curb 6: Modified independent (Device/increase time)   Gait Comments Patient improving on steps with step to pattern without handrail mod indep and with close supervision with reciprocal pattern and no handrail.   Dynamic gait activities with ball toss, direction changes.     Neuro Re-ed    Neuro Re-ed Details  Standing balance on incline with mat (blue mat) for compliant surface walking up/down inclines with close supervision, turns 180 degrees, marching, and alternating L E foot taps with CGA for safety.  Standing balance with head motion emphasizing speed of movement.  Balance Exercises - 05/15/17 1005      Balance Exercises: Standing   Standing Eyes Opened Wide (BOA);Foam/compliant surface;Head turns   Standing Eyes Closed Wide (BOA);Foam/compliant surface   Stepping Strategy Anterior;Posterior;Foam/compliant surface             PT Short Term Goals - 05/08/17 1020      PT SHORT TERM GOAL #1   Title The patient will return demo HEP for R LE strength, dynamic balance activities.   Baseline 04/14/17: met today with current HEP   Time 4   Period Weeks   Status Achieved     PT  SHORT TERM GOAL #2   Title The patient will improve gait speed from 2 ft/sec to > or equal to 2.4 ft/sec to demo increasing functional mobility.   Baseline 04/14/17: 2.4 ft/sec with cane today   Time 4   Period Weeks   Status Achieved     PT SHORT TERM GOAL #3   Title The patient will improve FGA from 8/30 to > or equal to 12/30 to demo improving dynamic balance/gait.   Baseline scored 13/30 on FGA on 04/12/2017   Time 4   Period Weeks   Status Achieved     PT SHORT TERM GOAL #4   Title The patient will return demo stair negotiation with one handrail, reciprocal pattern, modified indep.   Baseline 02/10/17: The patient is able to perform this mod indep, however expresses fear of falling and has slowed pace to perform.   Time 4   Period Weeks   Status Achieved           PT Long Term Goals - 05/12/17 1033      PT LONG TERM GOAL #1   Title The patient will be indep with progression of HEP.   Baseline 05/08/17: updated today, needs cues   Period Weeks   Status On-going     PT LONG TERM GOAL #2   Title The patient will improve Gait speed from 2 ft/sec to > or equal to 2.8 ft/sec to demo return to "full community ambulator" classification of gait.   Baseline Gait speed= 3.1 ft/sec   Time 8   Period Weeks   Status Achieved     PT LONG TERM GOAL #3   Title The patient will improve FGA from 8/30 up to 15/30 to demo improving dynamic balance activities.   Baseline 16/30 on 05/12/17   Time 8   Period Weeks   Status Achieved     PT LONG TERM GOAL #4   Title Improve neuro QOL LE from 44.4% up to 60% to demo improving self perception of disability.   Baseline Target date 05/15/2017   Time 8   Period Weeks   Status On-going     PT LONG TERM GOAL #5   Title The patient will improve R LE strength to 5/5.   Baseline Met on 04/24/17   Time 8   Period Weeks   Status Achieved               Plan - 05/15/17 1618    Clinical Impression Statement The patient is progressing to  ambulation without device for household surfaces and today attempted with PT outdoors.  She notes improved confidence with ambulation and has had dec'd falls this week.    PT Treatment/Interventions ADLs/Self Care Home Management;Therapeutic activities;Therapeutic exercise;Balance training;Neuromuscular re-education;Gait training;Stair training;Patient/family education;Functional mobility training;Orthotic Fit/Training   PT Next Visit Plan Renew 4 more weeks.  Goals  to include: FGA, endurance (10 minutes outdoors nonsto), HEP, and neuro QOL.  Scheduled 2 more weeks.  Emphasize high level balance, dynamic gait activities.   Consulted and Agree with Plan of Care Patient      Patient will benefit from skilled therapeutic intervention in order to improve the following deficits and impairments:  Difficulty walking, Decreased activity tolerance, Decreased balance, Decreased mobility, Decreased strength, Impaired sensation  Visit Diagnosis: Muscle weakness (generalized)  Unsteadiness on feet  Other abnormalities of gait and mobility     Problem List Patient Active Problem List   Diagnosis Date Noted  . Hemiparesis affecting right side as late effect of cerebrovascular accident ([place]) 03/23/2017  . Lacunar infarct, acute ([place]) 03/23/2017  . Visual disturbance as complication of stroke 35/70/1779  . Adjustment disorder with anxious mood   . 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 Sickle cell trait ([place])   . Hyperlipidemia   . Thyroid nodule   . Changes in vision   . Acute ischemic stroke ([place])   . Dysphagia, post-stroke   . Benign essential HTN   . Polycystic kidney disease   . Prediabetes   . Acute intractable headache   . Leukocytosis   . Stroke (cerebrum) ([place]) 02/25/2017    Dicky Boer, PT 05/15/2017, 4:20 PM  [facility] [address] [place], [place], [address] Phone:  [phone]   Fax:  [phone]  Name: Sharon Chan MRN: [id number] Date of Birth: 10/14/1976

## 2017-05-15 NOTE — Therapy (Signed)
[facility] [address] [place], [place], [address] Phone: [phone]   Fax:  [phone]  Occupational Therapy Treatment  Patient Details  Name: Sharon Chan MRN: [id number] Date of Birth: 02-14-1976 Referring Provider: Dr. Alysia Penna  Encounter Date: 05/15/2017      OT End of Session - 05/15/17 1023    Visit Number 20   Number of Visits 27  19+8=27   Date for OT Re-Evaluation 06/16/17   Authorization Type Medicare/Medicaid; g-code needed   Authorization Time Period renewal completed 7/86/76 with cert. date 05/11/17-07/09/17   Authorization - Visit Number 2  G code completed at renewal 05/11/17   Authorization - Number of Visits 10   OT Start Time 1019   OT Stop Time 1100   OT Time Calculation (min) 41 min   Activity Tolerance Patient tolerated treatment well   Behavior During Therapy WFL for tasks assessed/performed      Past Medical History:  Diagnosis Date  . CVA (cerebral vascular accident) ([place])   . Polycystic disease, ovaries   . Seizures ([place])   . Sickle cell trait [facility])     Past Surgical History:  Procedure Laterality Date  . LAPAROSCOPIC CHOLECYSTECTOMY      There were no vitals filed for this visit.      Subjective Assessment - 05/15/17 1020    Subjective  No new falls   Pertinent History L CVA 02/25/17; HTN, polycystic kidney disease, hyperlipidemia, sickle cell trait, scans showed 2 old CVAs, sickle cell trait, prediabetes    Limitations no driving, fall risk, visual deficits   Patient Stated Goals improve L hand strength/functional use, be able to chop/peel   Currently in Pain? Yes   Pain Score 8    Pain Location --  head   Pain Orientation --  headache   Pain Descriptors / Indicators --  headache   Pain Type Chronic pain   Pain Onset More than a month ago   Pain Frequency Constant   Aggravating Factors  headache   Pain Relieving Factors darkness      In supine,  shoulder flex, chest press, and diagonals x10-15 with 4lb weighted ball with min v.c. Initially.   In sitting, shoulder flex, chest press and floor>chest with 4lb weighted ball x10 each with min v.c. Initially.  In quadraped, alternating UE lifts followed by forward/backward wt. Shift for incr core/scapular stability/strength and neuro re-ed.  Environmental scanning in busy, dynamic environment with 14/15 items found on first attempt, remaining item found on 2nd attempt with cueing.  Picking up blocks with gripper set on level 2 (black spring) for sustained grip strength with min-mod difficulty.  Placing [place] pegs in pegboard with tweezers with min difficulty.  Arm bike x6 min level 4 for conditioning/reciprocal movements.                        OT Short Term Goals - 05/08/17 1026      OT SHORT TERM GOAL #1   Title Pt will be independent with initial HEP.--check STGs 04/15/17   Time 4   Period Weeks   Status Achieved  04/06/17  met     OT SHORT TERM GOAL #2   Title Pt will improve R hand coordination/functional reaching as shown by improving score on box and blocks test by at least 10.   Baseline R-28blocks, L-52blocks   Time 4   Period Weeks   Status Achieved  04/13/17:  38 blocks     OT SHORT TERM GOAL #3   Title Pt will improve R hand coordination for ADLs as shown by improving time on 9-hole peg test by at least 10sec.   Baseline R-57.19sec   Time 4   Period Weeks   Status Achieved  04/13/17:  35.94sec     OT SHORT TERM GOAL #4   Title Pt will perform tabletop visual scanning with at least 95% accuracy with incr time.   Time 4   Period Weeks   Status Achieved  04/13/17:  inconsistent approx 78% with reading guide.  05/08/17  with 100% accuracy with reading guide     OT SHORT TERM GOAL #5   Title Pt will verbalize understanding of memory/visual compensation strategies.   Time 4   Period Weeks   Status Achieved  04/06/17 met     OT SHORT TERM  GOAL #6   Title Pt will improve R grip strength by at least 8lbs to assist with ADLs.   Baseline R-18lbs   Time 4   Period Weeks   Status Achieved  04/13/17:  28lbs     OT SHORT TERM GOAL #7   Title Pt will perform dressing/bathing without falls x 2 weeks utilizing safety strategies (supervision).   Time 4   Period Weeks   Status Achieved  04/13/17:  last fall in shower 04/04/17,  met 05/08/17           OT Long Term Goals - 05/11/17 0946      OT LONG TERM GOAL #1   Title Pt will be independent with updated HEP.--check LTGs 05/15/17   Time 8   Period Weeks   Status On-going  05/11/17  needs additional updates.     OT LONG TERM GOAL #2   Title Pt will perform simple cooking/home maintenance tasks mod I.--met initial goal, update 05/11/17 to mod complex cooking/home maintenance tasks mod I (including peeling and chopping).   Time 8   Period Weeks   Status Revised  05/11/17:  met and updated.     OT LONG TERM GOAL #3   Title Pt will improve coordination for ADLs as shown by improving time on 9-hole peg test by at least 20sec with RUE.--revised 04/13/17:  completing test in 28sec or less   Baseline R-57.19sec   Time 8   Period Weeks   Status Revised  04/13/17:  met initial goal and revised goal (35.94sec).  05/11/17  not met  33.75sec     OT LONG TERM GOAL #4   Title Pt will perform environmental scanning with at least 90% accuracy for incr safety in the community.--Met with min distracting environment.  Update goal 05/11/17 to 90% accuracy in mod distracting environement   Time 8   Period Weeks   Status Revised  05/11/17:  met and revised goal     OT LONG TERM GOAL #5   Title Pt will improve R grip strength by at least 15lbs to assist with ADLs.   Baseline R-18lbs   Time 8   Period Weeks   Status On-going  05/08/17:  10lbs (however, not consistent with functional performance during session).  05/11/17  10lbs     OT LONG TERM GOAL #6   Title Pt will be able to retrieve 3lb  object from overhead shelf x3 safely with RUE.   Time 8   Period Weeks   Status Achieved  05/11/17  Plan - 05/15/17 1023    Rehab Potential Good   Current Impairments/barriers affecting progress: decr sensation, fall risk   OT Frequency 2x / week   OT Duration 4 weeks   OT Treatment/Interventions Self-care/ADL training;Moist Heat;Fluidtherapy;DME and/or AE instruction;Splinting;Patient/family education;Balance training;Therapeutic exercises;Contrast Bath;Ultrasound;Therapeutic exercise;Therapeutic activities;Cognitive remediation/compensation;Passive range of motion;Functional Mobility Training;Neuromuscular education;Cryotherapy;Electrical Stimulation;Parrafin;Energy conservation;Manual Therapy;Visual/perceptual remediation/compensation   Plan continue with strengthening, RUE functional use, environmental scanning    OT Home Exercise Plan Education provided:  03/28/17 coordination HEP, red putty HEP , red theraband issued 03/30/17. A/E recommendations, Lifeline recommendations 05/04/17.    Consulted and Agree with Plan of Care Patient      Patient will benefit from skilled therapeutic intervention in order to improve the following deficits and impairments:  Decreased coordination, Impaired sensation, Decreased range of motion, Decreased safety awareness, Decreased activity tolerance, Decreased balance, Decreased knowledge of use of DME, Impaired UE functional use, Impaired vision/preception, Impaired perceived functional ability, Decreased strength, Decreased mobility, Decreased cognition  Visit Diagnosis: Hemiplegia and hemiparesis following cerebral infarction affecting right non-dominant side (HCC)  Muscle weakness (generalized)  Unsteadiness on feet  Other abnormalities of gait and mobility  Other lack of coordination  Visuospatial deficit  Attention and concentration deficit  Unspecified symptoms and signs involving cognitive functions following cerebral  infarction  Other disturbances of skin sensation    Problem List Patient Active Problem List   Diagnosis Date Noted  . Hemiparesis affecting right side as late effect of cerebrovascular accident ([place]) 03/23/2017  . Lacunar infarct, acute ([place]) 03/23/2017  . Visual disturbance as complication of stroke 81/85/6314  . Adjustment disorder with anxious mood   . 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 Sickle cell trait ([place])   . Hyperlipidemia   . Thyroid nodule   . Changes in vision   . Acute ischemic stroke ([place])   . Dysphagia, post-stroke   . Benign essential HTN   . Polycystic kidney disease   . Prediabetes   . Acute intractable headache   . Leukocytosis   . Stroke (cerebrum) ([place]) 02/25/2017    [facility] 05/15/2017, 10:57 AM  [facility] [address] [place], [place], [address] Phone: [phone]   Fax:  [phone]  Name: TWINKLE SOCKWELL MRN: [id number] Date of Birth: [birth year]   Vianne Bulls, OTR/L [facility] [address] [place], [place]  [address] [phone] phone [phone] 05/15/17 10:57 AM

## 2017-05-18 ENCOUNTER — Ambulatory Visit: Payer: Medicare Other

## 2017-05-18 ENCOUNTER — Ambulatory Visit: Payer: Medicare Other | Admitting: Occupational Therapy

## 2017-05-18 ENCOUNTER — Ambulatory Visit: Payer: Medicare Other | Admitting: Rehabilitative and Restorative Service Providers"

## 2017-05-18 DIAGNOSIS — R208 Other disturbances of skin sensation: Secondary | ICD-10-CM

## 2017-05-18 DIAGNOSIS — M6281 Muscle weakness (generalized): Secondary | ICD-10-CM | POA: Diagnosis not present

## 2017-05-18 DIAGNOSIS — R41840 Attention and concentration deficit: Secondary | ICD-10-CM

## 2017-05-18 DIAGNOSIS — R41842 Visuospatial deficit: Secondary | ICD-10-CM | POA: Diagnosis not present

## 2017-05-18 DIAGNOSIS — R2689 Other abnormalities of gait and mobility: Secondary | ICD-10-CM | POA: Diagnosis not present

## 2017-05-18 DIAGNOSIS — R2681 Unsteadiness on feet: Secondary | ICD-10-CM

## 2017-05-18 DIAGNOSIS — I69353 Hemiplegia and hemiparesis following cerebral infarction affecting right non-dominant side: Secondary | ICD-10-CM

## 2017-05-18 DIAGNOSIS — R278 Other lack of coordination: Secondary | ICD-10-CM | POA: Diagnosis not present

## 2017-05-18 DIAGNOSIS — I69319 Unspecified symptoms and signs involving cognitive functions following cerebral infarction: Secondary | ICD-10-CM

## 2017-05-18 DIAGNOSIS — R4701 Aphasia: Secondary | ICD-10-CM

## 2017-05-18 NOTE — Patient Instructions (Signed)
  Please complete the assigned speech therapy homework prior to your next session and return it to the speech therapist at your next visit.  

## 2017-05-18 NOTE — Therapy (Signed)
[facility] [address] [place], [place], [address] Phone: [phone]   Fax:  [phone]  Occupational Therapy Treatment  Patient Details  Name: Sharon Chan MRN: [id number] Date of Birth: 1975/11/21 Referring Provider: Dr. Alysia Penna  Encounter Date: 05/18/2017      OT End of Session - 05/18/17 0956    Visit Number 21   Number of Visits 27  19+8=27   Date for OT Re-Evaluation 06/16/17   Authorization Type Medicare/Medicaid; g-code needed   Authorization Time Period renewal completed 2/59/56 with cert. date 05/11/17-07/09/17   Authorization - Visit Number 3  G code completed at renewal 05/11/17   Authorization - Number of Visits 10   OT Start Time 3875   OT Stop Time 1015   OT Time Calculation (min) 40 min   Activity Tolerance Patient tolerated treatment well   Behavior During Therapy [facility] for tasks assessed/performed      Past Medical History:  Diagnosis Date  . CVA (cerebral vascular accident) ([place])   . Polycystic disease, ovaries   . Seizures ([place])   . Sickle cell trait [facility])     Past Surgical History:  Procedure Laterality Date  . LAPAROSCOPIC CHOLECYSTECTOMY      There were no vitals filed for this visit.      Subjective Assessment - 05/18/17 0937    Subjective  no falls, played in grass with kids and did well   Pertinent History L CVA 02/25/17; HTN, polycystic kidney disease, hyperlipidemia, sickle cell trait, scans showed 2 old CVAs, sickle cell trait, prediabetes    Limitations no driving, fall risk, visual deficits   Patient Stated Goals improve L hand strength/functional use, be able to chop/peel   Currently in Pain? Yes   Pain Score 5    Pain Location --  headache   Pain Orientation --  headache   Pain Descriptors / Indicators --  headache   Pain Type Chronic pain   Pain Onset More than a month ago   Pain Frequency Constant   Aggravating Factors  headache   Pain  Relieving Factors darkness, tylenol with caffine        Picking up blocks with gripper set on level 2 (black spring) for sustained grip strength with min-mod difficulty.    Arm bike x6 min level 4 for reciprocal movement and conditioning without rest.  Completing purdue pegboard with incr time for coordination.  Environmental scanning to locate and retrieve items by color from various locations (floor, counter, overhead).  Pt with good environmental navigation for dynamic balance with ambulation.  Min cueing initially to use stable counter vs cane when bending down.  Functional reaching to remove/replace clothespins with 1-8lb resist on vertical pole for incr strength/activity tolerance (in standing).                          OT Short Term Goals - 05/08/17 1026      OT SHORT TERM GOAL #1   Title Pt will be independent with initial HEP.--check STGs 04/15/17   Time 4   Period Weeks   Status Achieved  04/06/17  met     OT SHORT TERM GOAL #2   Title Pt will improve R hand coordination/functional reaching as shown by improving score on box and blocks test by at least 10.   Baseline R-28blocks, L-52blocks   Time 4   Period Weeks   Status Achieved  04/13/17:  38 blocks  OT SHORT TERM GOAL #3   Title Pt will improve R hand coordination for ADLs as shown by improving time on 9-hole peg test by at least 10sec.   Baseline R-57.19sec   Time 4   Period Weeks   Status Achieved  04/13/17:  35.94sec     OT SHORT TERM GOAL #4   Title Pt will perform tabletop visual scanning with at least 95% accuracy with incr time.   Time 4   Period Weeks   Status Achieved  04/13/17:  inconsistent approx 78% with reading guide.  05/08/17  with 100% accuracy with reading guide     OT SHORT TERM GOAL #5   Title Pt will verbalize understanding of memory/visual compensation strategies.   Time 4   Period Weeks   Status Achieved  04/06/17 met     OT SHORT TERM GOAL #6   Title Pt will  improve R grip strength by at least 8lbs to assist with ADLs.   Baseline R-18lbs   Time 4   Period Weeks   Status Achieved  04/13/17:  28lbs     OT SHORT TERM GOAL #7   Title Pt will perform dressing/bathing without falls x 2 weeks utilizing safety strategies (supervision).   Time 4   Period Weeks   Status Achieved  04/13/17:  last fall in shower 04/04/17,  met 05/08/17           OT Long Term Goals - 05/11/17 0946      OT LONG TERM GOAL #1   Title Pt will be independent with updated HEP.--check LTGs 05/15/17   Time 8   Period Weeks   Status On-going  05/11/17  needs additional updates.     OT LONG TERM GOAL #2   Title Pt will perform simple cooking/home maintenance tasks mod I.--met initial goal, update 05/11/17 to mod complex cooking/home maintenance tasks mod I (including peeling and chopping).   Time 8   Period Weeks   Status Revised  05/11/17:  met and updated.     OT LONG TERM GOAL #3   Title Pt will improve coordination for ADLs as shown by improving time on 9-hole peg test by at least 20sec with RUE.--revised 04/13/17:  completing test in 28sec or less   Baseline R-57.19sec   Time 8   Period Weeks   Status Revised  04/13/17:  met initial goal and revised goal (35.94sec).  05/11/17  not met  33.75sec     OT LONG TERM GOAL #4   Title Pt will perform environmental scanning with at least 90% accuracy for incr safety in the community.--Met with min distracting environment.  Update goal 05/11/17 to 90% accuracy in mod distracting environement   Time 8   Period Weeks   Status Revised  05/11/17:  met and revised goal     OT LONG TERM GOAL #5   Title Pt will improve R grip strength by at least 15lbs to assist with ADLs.   Baseline R-18lbs   Time 8   Period Weeks   Status On-going  05/08/17:  10lbs (however, not consistent with functional performance during session).  05/11/17  10lbs     OT LONG TERM GOAL #6   Title Pt will be able to retrieve 3lb object from overhead shelf x3  safely with RUE.   Time 8   Period Weeks   Status Achieved  05/11/17               Plan - 05/18/17  0959    Clinical Impression Statement Pt continues to progress with coordination, strength, and functional mobility (no new falls in last >2 weeks).   Rehab Potential Good   Current Impairments/barriers affecting progress: decr sensation, fall risk   OT Frequency 2x / week   OT Duration 4 weeks   OT Treatment/Interventions Self-care/ADL training;Moist Heat;Fluidtherapy;DME and/or AE instruction;Splinting;Patient/family education;Balance training;Therapeutic exercises;Contrast Bath;Ultrasound;Therapeutic exercise;Therapeutic activities;Cognitive remediation/compensation;Passive range of motion;Functional Mobility Training;Neuromuscular education;Cryotherapy;Electrical Stimulation;Parrafin;Energy conservation;Manual Therapy;Visual/perceptual remediation/compensation   Plan continue with strengthening, RUE functional use   OT Home Exercise Plan Education provided:  03/28/17 coordination HEP, red putty HEP , red theraband issued 03/30/17. A/E recommendations, Lifeline recommendations 05/04/17.    Consulted and Agree with Plan of Care Patient      Patient will benefit from skilled therapeutic intervention in order to improve the following deficits and impairments:  Decreased coordination, Impaired sensation, Decreased range of motion, Decreased safety awareness, Decreased activity tolerance, Decreased balance, Decreased knowledge of use of DME, Impaired UE functional use, Impaired vision/preception, Impaired perceived functional ability, Decreased strength, Decreased mobility, Decreased cognition  Visit Diagnosis: Hemiplegia and hemiparesis following cerebral infarction affecting right non-dominant side (HCC)  Muscle weakness (generalized)  Unsteadiness on feet  Other lack of coordination  Visuospatial deficit  Attention and concentration deficit  Other abnormalities of gait and  mobility  Unspecified symptoms and signs involving cognitive functions following cerebral infarction  Other disturbances of skin sensation    Problem List Patient Active Problem List   Diagnosis Date Noted  . Hemiparesis affecting right side as late effect of cerebrovascular accident ([place]) 03/23/2017  . Lacunar infarct, acute ([place]) 03/23/2017  . Visual disturbance as complication of stroke 74/93/5521  . Adjustment disorder with anxious mood   . 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 Sickle cell trait ([place])   . Hyperlipidemia   . Thyroid nodule   . Changes in vision   . Acute ischemic stroke ([place])   . Dysphagia, post-stroke   . Benign essential HTN   . Polycystic kidney disease   . Prediabetes   . Acute intractable headache   . Leukocytosis   . Stroke (cerebrum) ([place]) 02/25/2017    [facility] 05/18/2017, 3:26 PM  [facility] [address] [place], [place], [address] Phone: [phone]   Fax:  [phone]  Name: ROENA SASSAMAN MRN: [id number] Date of Birth: May 10, 1976   Vianne Bulls, OTR/L [facility] [address] [place], [place]  [address] [phone] phone [phone] 05/18/17 3:26 PM

## 2017-05-18 NOTE — Therapy (Addendum)
[facility] [address] [place], [place], [address] Phone: [phone]   Fax:  [phone]  Physical Therapy Treatment  Patient Details  Name: Sharon Chan MRN: [id number] Date of Birth: 1976/08/15 Referring Provider: Alysia Penna, MD  Encounter Date: 05/18/2017      PT End of Session - 05/18/17 1419    Visit Number 17   Number of Visits 22   Date for PT Re-Evaluation 06/12/17   Authorization Type G code every 10th visit   PT Start Time 1018   PT Stop Time 1102   PT Time Calculation (min) 44 min   Equipment Utilized During Treatment Gait belt   Activity Tolerance Patient tolerated treatment well   Behavior During Therapy [facility] for tasks assessed/performed      Past Medical History:  Diagnosis Date  . CVA (cerebral vascular accident) ([place])   . Polycystic disease, ovaries   . Seizures ([place])   . Sickle cell trait [facility])     Past Surgical History:  Procedure Laterality Date  . LAPAROSCOPIC CHOLECYSTECTOMY      There were no vitals filed for this visit.      Subjective Assessment - 05/18/17 1019    Subjective The patient reports family surprised her and came into town yesterday.  She reports that she was able to play soccer (walking) in the grass with her neice.  No falls to report, she notes she has some increase in appetite.    She reports she did note some sensation in her right leg when using a ball to roll against her leg.   Pertinent History sickle cell, polycystic ovarian disease, obesity, thyroid nodule   Patient Stated Goals "To get back to a sense of normalcy", improve sensation in her right side and be independent.   Currently in Pain? Yes   Pain Score 5    Pain Location Head  and neck   Pain Descriptors / Indicators Headache   Pain Type Chronic pain   Pain Onset More than a month ago   Pain Frequency Constant   Aggravating Factors  headache   Pain Relieving Factors tylenol with  caffeine            [facility] PT Assessment - 05/18/17 1046      Functional Gait  Assessment   Gait assessed  Yes   Gait Level Surface Walks 20 ft, slow speed, abnormal gait pattern, evidence for imbalance or deviates 10-15 in outside of the 12 in walkway width. Requires more than 7 sec to ambulate 20 ft.   Change in Gait Speed Able to change speed, demonstrates mild gait deviations, deviates 6-10 in outside of the 12 in walkway width, or no gait deviations, unable to achieve a major change in velocity, or uses a change in velocity, or uses an assistive device.   Gait with Horizontal Head Turns Performs head turns smoothly with no change in gait. Deviates no more than 6 in outside 12 in walkway width   Gait with Vertical Head Turns Performs head turns with no change in gait. Deviates no more than 6 in outside 12 in walkway width.   Gait and Pivot Turn Pivot turns safely within 3 sec and stops quickly with no loss of balance.   Step Over Obstacle Is able to step over one shoe box (4.5 in total height) but must slow down and adjust steps to clear box safely. May require verbal cueing.   Gait with Narrow Base of Support Is  able to ambulate for 10 steps heel to toe with no staggering.   Gait with Eyes Closed Walks 20 ft, slow speed, abnormal gait pattern, evidence for imbalance, deviates 10-15 in outside 12 in walkway width. Requires more than 9 sec to ambulate 20 ft.   Ambulating Backwards Walks 20 ft, uses assistive device, slower speed, mild gait deviations, deviates 6-10 in outside 12 in walkway width.   Steps Alternating feet, no rail.   Total Score 22   FGA comment: 22/30, improved 8/30 at evaluation.       NEUROMUSCULAR RE-EDUCATION: Standing hopping in place near countertop and then moving into LE "jumping jack" movement with UE support (billed under gait due to < 5 minutes of NMR)        [facility] Adult PT Treatment/Exercise - 05/18/17 1046      Ambulation/Gait   Ambulation/Gait Yes    Ambulation/Gait Assistance 6: Modified independent (Device/Increase time)   Ambulation/Gait Assistance Details Patient negotiated community surfaces without a device with slowed speed.  No losses of balance noted, patient uses slowed pace.   Ambulation Distance (Feet) 1000 Feet   Assistive device None   Ambulation Surface Level;Unlevel;Indoor;Outdoor;Paved   Gait velocity 2.86 ft/sec   Gait Comments Treadmill x 8 minutes nonstop with bilateral UE support with patient demo'ing ability to manually adjust speed during use.  We discussed return to gym routine and using treadmill beginning at slow pace for safety.  Pace today 1.6 mph.  Patient then ambulated after treadmill working on faster/reciprocal pattern of gait emphasizing longer stride and arm swing x 500 ft.      Self-Care   Self-Care Other Self-Care Comments   Other Self-Care Comments  Gym activities and progressing endurance.                PT Education - 05/18/17 1417    Education provided Yes   Education Details return to gym (member @ [employer]) recommending use of treadmill at slow pace (1.5-1.8 mph) with bilateral UE support and supervision initially   Person(s) Educated Patient   Methods Explanation   Comprehension Verbalized understanding          PT Short Term Goals - 05/08/17 1020      PT SHORT TERM GOAL #1   Title The patient will return demo HEP for R LE strength, dynamic balance activities.   Baseline 04/14/17: met today with current HEP   Time 4   Period Weeks   Status Achieved     PT SHORT TERM GOAL #2   Title The patient will improve gait speed from 2 ft/sec to > or equal to 2.4 ft/sec to demo increasing functional mobility.   Baseline 04/14/17: 2.4 ft/sec with cane today   Time 4   Period Weeks   Status Achieved     PT SHORT TERM GOAL #3   Title The patient will improve FGA from 8/30 to > or equal to 12/30 to demo improving dynamic balance/gait.   Baseline scored 13/30 on FGA on 04/12/2017    Time 4   Period Weeks   Status Achieved     PT SHORT TERM GOAL #4   Title The patient will return demo stair negotiation with one handrail, reciprocal pattern, modified indep.   Baseline 02/10/17: The patient is able to perform this mod indep, however expresses fear of falling and has slowed pace to perform.   Time 4   Period Weeks   Status Achieved  PT Long Term Goals - 05/18/17 1057      PT LONG TERM GOAL #1   Title The patient will be indep with progression of HEP.   Baseline REVISED TARGET DATE 06/12/2017 to progress HEP.    Time 4   Period Weeks   Status Revised     PT LONG TERM GOAL #2   Title The patient will improve Gait speed from 2 ft/sec to > or equal to 2.8 ft/sec to demo return to "full community ambulator" classification of gait.   Baseline Gait speed= 3.1 ft/sec   Time 8   Period Weeks   Status Achieved     PT LONG TERM GOAL #3   Title The patient will improve FGA from 22/30 today up to 24/30 to demo improving dynamic gait/balance.   Baseline REVISED GOAL TARGET DATE 06/12/2017   Time 4   Period Weeks   Status Revised     PT LONG TERM GOAL #4   Title Improve neuro QOL LE from 44.4% up to 60% to demo improving self perception of disability.   Baseline REVISED TARGET DATE 06/12/2017   Time 8   Period Weeks   Status Revised     PT LONG TERM GOAL #5   Title The patient will improve R LE strength to 5/5.   Baseline Met on 04/24/17   Time 8   Period Weeks   Status Achieved     Additional Long Term Goals   Additional Long Term Goals Yes     PT LONG TERM GOAL #6   Title The patient will verbalize understanding of return to gym routine.   Baseline TARGET DATE 06/12/2017   Time 4   Period Weeks   Status New               Plan - 05/18/17 1426    Clinical Impression Statement The patient improved FGA further today now scoring 22/30.  She tolerated greater endurnace activities today and PT encouraged return to gym routine/treadmill  walking.  Recommended continue PT with main goals to improve dynamic gait/balance, improve endurance, return to gym routine, further develop HEP.    PT Frequency 2x / week   PT Duration 4 weeks   PT Treatment/Interventions ADLs/Self Care Home Management;Therapeutic activities;Therapeutic exercise;Balance training;Neuromuscular re-education;Gait training;Stair training;Patient/family education;Functional mobility training;Orthotic Fit/Training   PT Next Visit Plan Emphasize high level balance, dynamic gait, treadmill, endurance, return to gym routine   Consulted and Agree with Plan of Care Patient      Patient will benefit from skilled therapeutic intervention in order to improve the following deficits and impairments:  Difficulty walking, Decreased activity tolerance, Decreased balance, Decreased mobility, Decreased strength, Impaired sensation  Visit Diagnosis: Unsteadiness on feet  Other abnormalities of gait and mobility  Muscle weakness (generalized)     Problem List Patient Active Problem List   Diagnosis Date Noted  . Hemiparesis affecting right side as late effect of cerebrovascular accident ([place]) 03/23/2017  . Lacunar infarct, acute ([place]) 03/23/2017  . Visual disturbance as complication of stroke 81/27/5170  . Adjustment disorder with anxious mood   . 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 Sickle cell trait ([place])   . Hyperlipidemia   . Thyroid nodule   . Changes in vision   . Acute ischemic stroke ([place])   . Dysphagia, post-stroke   . Benign essential HTN   . Polycystic kidney disease   . Prediabetes   . Acute intractable headache   .  Leukocytosis   . Stroke (cerebrum) ([place]) 02/25/2017    Koby Pickup, PT 05/18/2017, 2:28 PM  [facility] [address] [place], [place], [address] Phone: [phone]   Fax:  [phone]  Name: Sharon Chan MRN:  [id number] Date of Birth: [birth year]

## 2017-05-18 NOTE — Therapy (Addendum)
[facility] [address] [place], [place], [address] Phone: [phone]   Fax:  [phone]  Speech Language Pathology Treatment  Patient Details  Name: ADELL KOVAL MRN: [id number] Date of Birth: [birth year] No Data Recorded  Encounter Date: 05/18/2017      End of Session - 05/18/17 1213    Visit Number 9   Number of Visits 17   Date for SLP Re-Evaluation 06/02/17   SLP Start Time 1103   SLP Stop Time  1146   SLP Time Calculation (min) 43 min   Activity Tolerance Patient tolerated treatment well      Past Medical History:  Diagnosis Date  . CVA (cerebral vascular accident) ([place])   . Polycystic disease, ovaries   . Seizures ([place])   . Sickle cell trait [facility])     Past Surgical History:  Procedure Laterality Date  . LAPAROSCOPIC CHOLECYSTECTOMY      There were no vitals filed for this visit.      Subjective Assessment - 05/18/17 1112    Subjective "I met with my pastor and had prayer, and I also talked with a counselor a couple times. I haven't been quite as anxious."   Currently in Pain? Yes   Pain Score 5    Pain Location Head   Pain Descriptors / Indicators Headache   Pain Type Chronic pain   Pain Onset More than a month ago   Pain Frequency Constant   Aggravating Factors  light, noise   Pain Relieving Factors pain reliever with caffeine               ADULT SLP TREATMENT - 05/18/17 1117      General Information   Behavior/Cognition Alert;Pleasant mood;Cooperative     Treatment Provided   Treatment provided Cognitive-Linquistic     Cognitive-Linquistic Treatment   Treatment focused on Aphasia   Skilled Treatment Pt reports not needing to use text to speech nearly as much with her phone and writing numbers out/checks is also better. Reports using ATM for first time. Other aphasic errors were described by pt ("If" instead of "of", "spiced" instead of "spiked", "peaches" instead of  "peach", etc) in last three days. SLP described to pt brain's need to get all the morphemes and phonemes in the correct order and she can facilitate that by continuing slowing her rate. In conversation tasks today pt did not exhibit anomia/dysnomia that debilitated her ability to communicate with SLP and SLP used this opportunity to encourage pt to continue to communicate with others as she did prior to CVA. Homework for anomia was provided.      Assessment / Recommendations / Plan   Plan Continue with current plan of care     Progression Toward Goals   Progression toward goals Progressing toward goals            SLP Short Term Goals - 05/04/17 1221      SLP SHORT TERM GOAL #1   Title Pt will tell four ways to compensate for anomia   Status Achieved     SLP SHORT TERM GOAL #2   Title pt will complete mod complex naming tasks independently with 90% success over three sessions   Time 1   Status Not Met     SLP SHORT TERM GOAL #3   Title pt will engage functionally in 10 minutes mod complex/complex conversation using compensatory strategies PRN over three sessions   Baseline 04-24-17, 04/27/17   Time 1  Period Weeks   Status Achieved          SLP Long Term Goals - 05/18/17 1217      SLP LONG TERM GOAL #1   Title pt will engage functionally in 15 minutes mostly complex conversation outside [place] room, using compensatory strategies PRN over three sessions   Time 2   Period Weeks   Status On-going     SLP LONG TERM GOAL #2   Title pt will talk with SLP outside [place] room for 10 mintues, using compensatory strategies for anomia x2 sessions   Time 2   Period Weeks   Status On-going     SLP LONG TERM GOAL #3   Title pt will read 2 sentence stimuli and answer questions with modified independence (compensations)   Time 2   Period Weeks   Status On-going          Plan - 05/18/17 1214    Clinical Impression Statement Word finding deficts in conversation were evident in pt's  conversational speech to this trained listener, however pt's message never required intervention for breakdown. Conversational speech has improved in last 2 weeks. Pt reports less anxiety and frustration around speaking, and told SLP today she is more light-hearted about her language. Skilled ST is recommended for improving her verbal expression as close to PLOF as possible.    Speech Therapy Frequency 2x / week   Treatment/Interventions Language facilitation;Compensatory techniques;Internal/external aids;SLP instruction and feedback;Multimodal communcation approach;Patient/family education;Functional tasks   Potential to Achieve Goals Good   SLP Home Exercise Plan reading aloud, writing bible interpretations   Consulted and Agree with Plan of Care Patient      Patient will benefit from skilled therapeutic intervention in order to improve the following deficits and impairments:   Aphasia  G-Codes -SLP Spoken language current [id number]) CJ (20-40% impaired) Spoken language goal (N8295) CI  (0-19% impaired)   Problem List Patient Active Problem List   Diagnosis Date Noted  . Hemiparesis affecting right side as late effect of cerebrovascular accident ([place]) 03/23/2017  . Lacunar infarct, acute ([place]) 03/23/2017  . Visual disturbance as complication of stroke 62/13/0865  . Adjustment disorder with anxious mood   . 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 Sickle cell trait ([place])   . Hyperlipidemia   . Thyroid nodule   . Changes in vision   . Acute ischemic stroke ([place])   . Dysphagia, post-stroke   . Benign essential HTN   . Polycystic kidney disease   . Prediabetes   . Acute intractable headache   . Leukocytosis   . Stroke (cerebrum) ([place]) 02/25/2017    [facility] ,[place], [place]  05/18/2017, 12:18 PM  [facility] [address] [place], [place], [address] Phone: [phone]   Fax:   [phone]   Name: MAYRA JOLLIFFE MRN: [id number] Date of Birth: November 26, 1975

## 2017-05-22 ENCOUNTER — Ambulatory Visit: Payer: Medicare Other | Admitting: Rehabilitative and Restorative Service Providers"

## 2017-05-22 ENCOUNTER — Ambulatory Visit: Payer: Medicare Other

## 2017-05-22 ENCOUNTER — Other Ambulatory Visit: Payer: Self-pay | Admitting: Family Medicine

## 2017-05-22 DIAGNOSIS — R2681 Unsteadiness on feet: Secondary | ICD-10-CM

## 2017-05-22 DIAGNOSIS — R2689 Other abnormalities of gait and mobility: Secondary | ICD-10-CM

## 2017-05-22 DIAGNOSIS — R4701 Aphasia: Secondary | ICD-10-CM

## 2017-05-22 DIAGNOSIS — R41841 Cognitive communication deficit: Secondary | ICD-10-CM

## 2017-05-22 DIAGNOSIS — M6281 Muscle weakness (generalized): Secondary | ICD-10-CM | POA: Diagnosis not present

## 2017-05-22 DIAGNOSIS — R41842 Visuospatial deficit: Secondary | ICD-10-CM | POA: Diagnosis not present

## 2017-05-22 DIAGNOSIS — I69353 Hemiplegia and hemiparesis following cerebral infarction affecting right non-dominant side: Secondary | ICD-10-CM | POA: Diagnosis not present

## 2017-05-22 DIAGNOSIS — R278 Other lack of coordination: Secondary | ICD-10-CM | POA: Diagnosis not present

## 2017-05-22 NOTE — Patient Instructions (Signed)
  Please complete the assigned speech therapy homework prior to your next session and return it to the speech therapist at your next visit.  

## 2017-05-22 NOTE — Therapy (Signed)
[facility] [address] [place], [place], [address] Phone: [phone]   Fax:  [phone]  Physical Therapy Treatment and Discharge Summary  Patient Details  Name: Sharon Chan MRN: [id number] Date of Birth: Feb 18, 1976 Referring Provider: Alysia Penna, MD  Encounter Date: 05/22/2017      PT End of Session - 05/22/17 1050    Visit Number 18   Number of Visits 22   Date for PT Re-Evaluation 06/12/17   Authorization Type G code every 10th visit   PT Start Time 1016   PT Stop Time 1045   PT Time Calculation (min) 29 min   Equipment Utilized During Treatment Gait belt   Activity Tolerance Patient tolerated treatment well   Behavior During Therapy [facility] for tasks assessed/performed      Past Medical History:  Diagnosis Date  . CVA (cerebral vascular accident) ([place])   . Polycystic disease, ovaries   . Seizures ([place])   . Sickle cell trait [facility])     Past Surgical History:  Procedure Laterality Date  . LAPAROSCOPIC CHOLECYSTECTOMY      There were no vitals filed for this visit.      Subjective Assessment - 05/22/17 1022    Subjective The patient notes she has tried treadmill walking at the gym and it went well.  She notes she is getting back to social interactions with friends.     Pertinent History sickle cell, polycystic ovarian disease, obesity, thyroid nodule   Patient Stated Goals "To get back to a sense of normalcy", improve sensation in her right side and be independent.   Currently in Pain? Yes   Pain Score 5    Pain Location Head   Pain Descriptors / Indicators Headache   Pain Type Chronic pain   Pain Onset More than a month ago   Pain Frequency Constant   Aggravating Factors  light, noise   Pain Relieving Factors caffein            [facility] PT Assessment - 05/22/17 1028      Functional Gait  Assessment   Gait assessed  Yes   Gait Level Surface Walks 20 ft in less than 7 sec but  greater than 5.5 sec, uses assistive device, slower speed, mild gait deviations, or deviates 6-10 in outside of the 12 in walkway width.   Change in Gait Speed Able to smoothly change walking speed without loss of balance or gait deviation. Deviate no more than 6 in outside of the 12 in walkway width.   Gait with Horizontal Head Turns Performs head turns smoothly with no change in gait. Deviates no more than 6 in outside 12 in walkway width   Gait with Vertical Head Turns Performs head turns with no change in gait. Deviates no more than 6 in outside 12 in walkway width.   Gait and Pivot Turn Pivot turns safely within 3 sec and stops quickly with no loss of balance.   Step Over Obstacle Is able to step over one shoe box (4.5 in total height) but must slow down and adjust steps to clear box safely. May require verbal cueing.   Gait with Narrow Base of Support Is able to ambulate for 10 steps heel to toe with no staggering.   Gait with Eyes Closed Walks 20 ft, slow speed, abnormal gait pattern, evidence for imbalance, deviates 10-15 in outside 12 in walkway width. Requires more than 9 sec to ambulate 20 ft.   Ambulating Backwards  Walks 20 ft, uses assistive device, slower speed, mild gait deviations, deviates 6-10 in outside 12 in walkway width.   Steps Alternating feet, no rail.   Total Score 24   FGA comment: 24/30                     [facility] Adult PT Treatment/Exercise - 05/22/17 1042      Self-Care   Self-Care Other Self-Care Comments   Other Self-Care Comments  PT and patient discussed return to gym activities, increasing walking distance, remaining deficits with sensory limitations, and how to progress at end of PT.                PT Education - 05/22/17 1049    Education provided Yes   Education Details see self care infomration   Person(s) Educated Patient   Methods Explanation   Comprehension Verbalized understanding          PT Short Term Goals - 05/08/17  1020      PT SHORT TERM GOAL #1   Title The patient will return demo HEP for R LE strength, dynamic balance activities.   Baseline 04/14/17: met today with current HEP   Time 4   Period Weeks   Status Achieved     PT SHORT TERM GOAL #2   Title The patient will improve gait speed from 2 ft/sec to > or equal to 2.4 ft/sec to demo increasing functional mobility.   Baseline 04/14/17: 2.4 ft/sec with cane today   Time 4   Period Weeks   Status Achieved     PT SHORT TERM GOAL #3   Title The patient will improve FGA from 8/30 to > or equal to 12/30 to demo improving dynamic balance/gait.   Baseline scored 13/30 on FGA on 04/12/2017   Time 4   Period Weeks   Status Achieved     PT SHORT TERM GOAL #4   Title The patient will return demo stair negotiation with one handrail, reciprocal pattern, modified indep.   Baseline 02/10/17: The patient is able to perform this mod indep, however expresses fear of falling and has slowed pace to perform.   Time 4   Period Weeks   Status Achieved           PT Long Term Goals - 05/22/17 1026      PT LONG TERM GOAL #1   Title The patient will be indep with progression of HEP.   Baseline Met per report.   Time 4   Period Weeks   Status Achieved     PT LONG TERM GOAL #2   Title The patient will improve Gait speed from 2 ft/sec to > or equal to 2.8 ft/sec to demo return to "full community ambulator" classification of gait.   Baseline Gait speed= 3.1 ft/sec   Time 8   Period Weeks   Status Achieved     PT LONG TERM GOAL #3   Title The patient will improve FGA from 22/30 today up to 24/30 to demo improving dynamic gait/balance.   Baseline REVISED GOAL TARGET DATE 06/12/2017-- Met on 05/22/17   Time 4   Period Weeks   Status Achieved     PT LONG TERM GOAL #4   Title Improve neuro QOL LE from 44.4% up to 60% to demo improving self perception of disability.   Baseline REVISED TARGET DATE 06/12/2017-- Pt improved up to 47% on Neuro QOL LE.   Time 8  Period Weeks   Status Not Met     PT LONG TERM GOAL #5   Title The patient will improve R LE strength to 5/5.   Baseline Met on 04/24/17   Time 8   Period Weeks   Status Achieved     PT LONG TERM GOAL #6   Title The patient will verbalize understanding of return to gym routine.   Baseline TARGET DATE 06/12/2017-- met on 06/03/17   Time 4   Period Weeks   Status Achieved               Plan - 2017-06-03 1043    Clinical Impression Statement The patient has met LTGs (with exception of neuro QOL score did not meet) with significant improvement in functional mobility, return to community exercise and improved independence.  PT and patient discussed her ability to return to gym today and she feels independent with activities for home/gym.  Patient agrees to d/c.    PT Treatment/Interventions ADLs/Self Care Home Management;Therapeutic activities;Therapeutic exercise;Balance training;Neuromuscular re-education;Gait training;Stair training;Patient/family education;Functional mobility training;Orthotic Fit/Training   PT Next Visit Plan Discharge today.   Consulted and Agree with Plan of Care Patient      Patient will benefit from skilled therapeutic intervention in order to improve the following deficits and impairments:  Difficulty walking, Decreased activity tolerance, Decreased balance, Decreased mobility, Decreased strength, Impaired sensation  Visit Diagnosis: Other abnormalities of gait and mobility  Muscle weakness (generalized)  Unsteadiness on feet       G-Codes - 06/03/17 1057    Functional Assessment Tool Used (Outpatient Only) FGA=24/30   Functional Limitation Mobility: Walking and moving around   Mobility: Walking and Moving Around Goal Status [id number]) At least 40 percent but less than 60 percent impaired, limited or restricted   Mobility: Walking and Moving Around Discharge Status [id number]) At least 1 percent but less than 20 percent impaired, limited or restricted        PHYSICAL THERAPY DISCHARGE SUMMARY  Visits from Start of Care: 18  Current functional level related to goals / functional outcomes:     PT Short Term Goals - 05/08/17 1020      PT SHORT TERM GOAL #1   Title The patient will return demo HEP for R LE strength, dynamic balance activities.   Baseline 04/14/17: met today with current HEP   Time 4   Period Weeks   Status Achieved     PT SHORT TERM GOAL #2   Title The patient will improve gait speed from 2 ft/sec to > or equal to 2.4 ft/sec to demo increasing functional mobility.   Baseline 04/14/17: 2.4 ft/sec with cane today   Time 4   Period Weeks   Status Achieved     PT SHORT TERM GOAL #3   Title The patient will improve FGA from 8/30 to > or equal to 12/30 to demo improving dynamic balance/gait.   Baseline scored 13/30 on FGA on 04/12/2017   Time 4   Period Weeks   Status Achieved     PT SHORT TERM GOAL #4   Title The patient will return demo stair negotiation with one handrail, reciprocal pattern, modified indep.   Baseline 02/10/17: The patient is able to perform this mod indep, however expresses fear of falling and has slowed pace to perform.   Time 4   Period Weeks   Status Achieved         PT Long Term Goals - June 03, 2017 1026  PT LONG TERM GOAL #1   Title The patient will be indep with progression of HEP.   Baseline Met per report.   Time 4   Period Weeks   Status Achieved     PT LONG TERM GOAL #2   Title The patient will improve Gait speed from 2 ft/sec to > or equal to 2.8 ft/sec to demo return to "full community ambulator" classification of gait.   Baseline Gait speed= 3.1 ft/sec   Time 8   Period Weeks   Status Achieved     PT LONG TERM GOAL #3   Title The patient will improve FGA from 22/30 today up to 24/30 to demo improving dynamic gait/balance.   Baseline REVISED GOAL TARGET DATE 06/12/2017-- Met on 05/22/17   Time 4   Period Weeks   Status Achieved     PT LONG TERM GOAL #4   Title Improve  neuro QOL LE from 44.4% up to 60% to demo improving self perception of disability.   Baseline REVISED TARGET DATE 06/12/2017-- Pt improved up to 47% on Neuro QOL LE.   Time 8   Period Weeks   Status Not Met     PT LONG TERM GOAL #5   Title The patient will improve R LE strength to 5/5.   Baseline Met on 04/24/17   Time 8   Period Weeks   Status Achieved     PT LONG TERM GOAL #6   Title The patient will verbalize understanding of return to gym routine.   Baseline TARGET DATE 06/12/2017-- met on 05/22/2017   Time 4   Period Weeks   Status Achieved       Remaining deficits: Sensory deficits R LE, dec'd high level balance, abnormality of gait.   Education / Equipment: HEP, progression of gym routine, return to home walking program.  Plan: Patient agrees to discharge.  Patient goals were partially met. Patient is being discharged due to meeting the stated rehab goals.  ?????        Thank you for the referral of this patient. Rudell Cobb, MPT   Francis, PT 05/22/2017, 10:55 AM  [facility] [address] [place], [place], [address] Phone: [phone]   Fax:  [phone]  Name: Sharon Chan MRN: [id number] Date of Birth: February 21, 1976

## 2017-05-22 NOTE — Therapy (Signed)
[facility] [address] [place], [place], [address] Phone: [phone]   Fax:  [phone]  Speech Language Pathology Treatment  Patient Details  Name: Sharon Chan MRN: [id number] Date of Birth: Sep 12, 1976 Referring MD: Alysia Penna, MD  Encounter Date: 05/22/2017      End of Session - 05/22/17 1412    Visit Number 11   Number of Visits 27   Date for SLP Re-Evaluation 08/04/17   SLP Start Time 0935   SLP Stop Time  1016   SLP Time Calculation (min) 41 min   Activity Tolerance Patient tolerated treatment well      Past Medical History:  Diagnosis Date  . CVA (cerebral vascular accident) ([place])   . Polycystic disease, ovaries   . Seizures ([place])   . Sickle cell trait [facility])     Past Surgical History:  Procedure Laterality Date  . LAPAROSCOPIC CHOLECYSTECTOMY      There were no vitals filed for this visit.      Subjective Assessment - 05/22/17 0957    Subjective Pt went to hospital and prayed with patients who are members of her church.   Currently in Pain? Yes   Pain Score 5    Pain Location Head   Pain Orientation --  headache   Pain Descriptors / Indicators Headache   Pain Type Chronic pain   Pain Onset More than a month ago   Pain Frequency Constant   Aggravating Factors  light, noise   Pain Relieving Factors caffeine               ADULT SLP TREATMENT - 05/22/17 1006      General Information   Behavior/Cognition Alert;Pleasant mood;Cooperative     Treatment Provided   Treatment provided Cognitive-Linquistic     Cognitive-Linquistic Treatment   Treatment focused on Cognition   Skilled Treatment As pt has provided details re: symptoms of higher level attention deficit since CVA, diagnostic tx today focused on pt's attention skills. In alternating attention tasks (simple conversation and written language task), pt exhibited more hesitation/pausing in her speech than she did  in mod complex conversation with SLP prior to starting task. Pt stated "Definitely when I changed things, it takes my brain a couple seconds to switch my thoughts."     Assessment / Recommendations / [place] with current plan of care;Goals updated     Progression Toward Goals   Progression toward goals Progressing toward goals  goal/s for attention added          SLP Education - 05/22/17 1412    Education provided Yes   Education Details results of diagnostic tx re: attention   Person(s) Educated Patient   Methods Explanation   Comprehension Verbalized understanding          SLP Short Term Goals - 05/22/17 1420      SLP SHORT TERM GOAL #1   Title Pt will tell four ways to compensate for anomia   Status Achieved     SLP SHORT TERM GOAL #2   Title pt will complete mod complex naming tasks independently with 90% success over three sessions   Time 1   Status Not Met     SLP SHORT TERM GOAL #3   Title pt will engage functionally in 10 minutes mod complex/complex conversation using compensatory strategies PRN over three sessions   Baseline 04-24-17, 04/27/17   Time 1   Period Weeks   Status Achieved  SLP SHORT TERM GOAL #4   Title pt will demo WFL alternating attention skills with two or more mod complex cognitive linguistic tasks with 95% success on both   Time 4   Period Weeks   Status New     SLP SHORT TERM GOAL #5   Title pt will use expressive language compensations in 10 minutes mod complex conversation over three sessions   Time 4   Period Weeks   Status New          SLP Long Term Goals - 05/22/17 1422      SLP LONG TERM GOAL #1   Title pt will engage functionally in 20 minutes mod complex -complex conversation outside [place] room, using compensatory strategies PRN over three sessions   Time 8   Period Weeks   Status Revised     SLP LONG TERM GOAL #2   Title pt will talk with SLP outside [place] room for 15 mintues, using compensatory  strategies for anomia x2 sessions   Time 8   Period Weeks   Status Revised     SLP LONG TERM GOAL #3   Title pt will read 5-6 sentence paragraph stimuli and answer questions with 90% success and modified independence (compensations, self-correction)   Time 8   Period Weeks   Status On-going          Plan - 05/22/17 1413    Clinical Impression Statement Word finding deficts in conversation continue as evident in pt's conversational speech, characterized by pausing and hesitant speech. However pt's message never requires intervention for breakdown while in ST. Pt reports less intervention due to breakdown in conversation in the last two weeks. She has been more concerned about what appear to be high level attention deficits occurring since CVA and please see thearpy note from today [place] in pt's high level attention skills. Pt reports less anxiety and frustration around speaking and her expresive language errors made. Skilled ST is recommended to cont for 8 weeks, for improving her verbal expression as close to PLOF as possible, as well as increasing her high level attention ability.    Speech Therapy Frequency 2x / week   Duration --  8 weeks   Treatment/Interventions Language facilitation;Compensatory techniques;Internal/external aids;SLP instruction and feedback;Multimodal communcation approach;Patient/family education;Functional tasks;Cognitive reorganization   Potential to Achieve Goals Good      Patient will benefit from skilled therapeutic intervention in order to improve the following deficits and impairments:   Aphasia - Plan: SLP plan of care cert/re-cert  Cognitive communication deficit - Plan: SLP plan of care cert/re-cert    Problem List Patient Active Problem List   Diagnosis Date Noted  . Hemiparesis affecting right side as late effect of cerebrovascular accident ([place]) 03/23/2017  . Lacunar infarct, acute ([place]) 03/23/2017  . Visual disturbance as complication  of stroke 54/27/0623  . Adjustment disorder with anxious mood   . 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 Sickle cell trait ([place])   . Hyperlipidemia   . Thyroid nodule   . Changes in vision   . Acute ischemic stroke ([place])   . Dysphagia, post-stroke   . Benign essential HTN   . Polycystic kidney disease   . Prediabetes   . Acute intractable headache   . Leukocytosis   . Stroke (cerebrum) ([place]) 02/25/2017    [facility] 05/22/2017, 2:26 PM  [facility] [address] [place], [place], [address] Phone: [phone]  Fax:  [phone]   Name: Sharon Chan MRN: [id number] Date of Birth: [birth year]

## 2017-05-23 ENCOUNTER — Other Ambulatory Visit: Payer: Self-pay | Admitting: Family Medicine

## 2017-05-23 NOTE — Progress Notes (Signed)
Patient ID: Sharon Chan, female   DOB: 09/07/1976, 41 y.o.   MRN: [id number]          Referring physician: Ledell NossKimberlee Harris FNP   Reason for Appointment: Evaluation of thyroid nodule    History of Present Illness:   The patient's thyroid nodule was first discovered incidentally when she was having a CT angiogram in April after her stroke  The thyroid ultrasound in 5/18 showed the following  There is a 2.2 cm left superior mixed cystic solid hypoechoic TR 3 nodule Recommendation was to follow-up in one year  The patient has not had any previous knowledge of swelling in her thyroid or mass in the neck She was told when she was about 41 years of age that she may be hypothyroid but never started on medications She does have history of weight loss, palpitations, heat intolerance and sweating in the last few months  She has had thyroid levels done during her admission and also with her PCP recently  Her TSH has been low on the last visit but free T4 index was normal    Lab Results  Component Value Date   FREET4 0.86 05/24/2017   TSH 0.99 05/24/2017   TSH 0.22 (L) 04/14/2017   TSH 2.264 02/28/2017    Allergies as of 05/24/2017   No Known Allergies     Medication List       Accurate as of 05/24/17  8:50 PM. Always use your most recent med list.          acetaminophen 325 MG tablet Commonly known as:  TYLENOL Take 1-2 tablets (325-650 mg total) by mouth every 4 (four) hours as needed for mild pain.   aspirin 325 MG tablet Take 1 tablet (325 mg total) by mouth daily.   atorvastatin 40 MG tablet Commonly known as:  LIPITOR TAKE 1 TABLET (40 MG TOTAL) BY MOUTH DAILY AT 6 PM.   butalbital-acetaminophen-caffeine 50-325-40-30 MG capsule Commonly known as:  FIORICET WITH CODEINE TAKE ONE CAPSULE EVERY 4 HOURS AS NEEDED FOR HEADACHE   cyclobenzaprine 5 MG tablet Commonly known as:  FLEXERIL Take 1 tablet (5 mg total) by mouth 3 (three) times daily as needed for  muscle spasms. Shoulder/neck pain   gabapentin 300 MG capsule Commonly known as:  NEURONTIN Take 1 tablet in the morning and 2 tablets at bedtime daily   lisinopril 10 MG tablet Commonly known as:  PRINIVIL,ZESTRIL Take 1 tablet (10 mg total) by mouth daily.   MAGNESIUM PO Take 1 tablet by mouth at bedtime.   metFORMIN 500 MG tablet Commonly known as:  GLUCOPHAGE Take 1 tablet (500 mg total) by mouth 2 (two) times daily with a meal.   naphazoline-glycerin 0.012-0.2 % Soln Commonly known as:  CLEAR EYES Place 1-2 drops into both eyes 4 (four) times daily -  with meals and at bedtime.   senna-docusate 8.6-50 MG tablet Commonly known as:  Senokot-S Take 2 tablets by mouth at bedtime.   topiramate 100 MG tablet Commonly known as:  TOPAMAX Take 1 tablet (100 mg total) by mouth 2 (two) times daily. Take 100mg  in am and 200mg  at night   traMADol 50 MG tablet Commonly known as:  ULTRAM Take 1-2 tablets (50-100 mg total) by mouth every 6 (six) hours as needed for moderate pain.   vitamin B-12 100 MCG tablet Commonly known as:  CYANOCOBALAMIN TAKE 1 TABLET BY MOUTH EVERY DAY       Allergies: No Known Allergies  Past  Medical History:  Diagnosis Date  . CVA (cerebral vascular accident) ([facility])   . Polycystic disease, ovaries   . Seizures ([facility])   . Sickle cell trait [facility])       Past Surgical History:  Procedure Laterality Date  . LAPAROSCOPIC CHOLECYSTECTOMY      Family History  Problem Relation Age of Onset  . Heart attack Mother   . Post-traumatic stress disorder Father        committed suicide  . Diabetes Father   . Hypertension Sister   . Thyroid cancer Paternal Grandmother     Social History:  reports that she has never smoked. She has never used smokeless tobacco. She reports that she does not drink alcohol. Her drug history is not on file.     Review of Systems  Constitutional: Positive for weight loss and reduced appetite.       Has apparently lost 40  pounds  HENT: Negative for trouble swallowing.   Respiratory: Positive for shortness of breath.   Cardiovascular: Positive for palpitations. Negative for leg swelling.  Gastrointestinal: Negative for abdominal pain.  Endocrine: Positive for menstrual changes and heat intolerance. Negative for fatigue.  Genitourinary: Positive for frequency.  Musculoskeletal: Negative for joint pain.  Neurological: Positive for weakness and numbness.       Related to right-sided stroke    She has not been diagnosed or treated for hypertension  BP Readings from Last 3 Encounters:  05/24/17 138/88  04/14/17 132/90  04/12/17 110/72   She has been told to have PCOS by gynecologist, has history of infrequent menstrual cycles, facial hirsutism and prediabetes with A1c 6.3 and last glucose 101.  Has been taking metformin only 500 mg twice a day for about 2 months     Examination:   BP 138/88 (BP Location: Left Arm, Patient Position: Sitting)   Pulse 88   Ht 5\' 2"  (1.575 m)   Wt 202 lb (91.6 kg)   LMP 05/03/2017   SpO2 98%   BMI 36.95 kg/m    General Appearance:  well-looking, has generalized obesity        Eyes: No abnormal prominence or swelling of the eyes         THYROID: Thyroid nodule is Not palpable on either side.  There is no lymphadenopathy in the neck  Heart sounds normal Lungs clear Abdomen shows no hepatosplenomegaly or other mass.    Reflexes at biceps and ankles are normal.  Skin: No rash or lesions.  Has facial and anterior midline abdominal hirsutism  Extremities: No edema  Assessment/Plan:  Thyroid nodule: She has a low risk nodule which is mixed cystic/solid and relatively small at 2.2 cm in size This is not clinically palpable, asymptomatic and diagnosed incidentally was having other radiological procedures  Radiological criteria indicate that needle aspiration is not indicated Also may be difficult to have her stop her aspirin at this time for doing a biopsy because of  recent stroke Discussed nature of thyroid nodules and current assessment She is comfortable with repeating the ultrasound in about 6 months as recommended  Low TSH level without Hyperthyroidism: Not clear if this is related to a hyperfunctioning nodule or a transient abnormality She will need to be thyroid levels to be done today Consider thyroid scan if TSH significantly lower and T4 or T3 upper normal  History of PCOS: Currently followed by gynecologist and no details available, also likely has prediabetes but not clear if the latter problem has been addressed Discussed  importance of monitoring for diabetes long-term and weight loss  Consultation note sent to the referring physician  [facility] 05/24/2017   ADDENDUM: TSH normal at 0.99, no scan indicated

## 2017-05-24 ENCOUNTER — Encounter: Payer: Self-pay | Admitting: Endocrinology

## 2017-05-24 ENCOUNTER — Ambulatory Visit: Payer: Medicare Other

## 2017-05-24 ENCOUNTER — Ambulatory Visit: Payer: Medicare Other | Admitting: Physical Therapy

## 2017-05-24 ENCOUNTER — Ambulatory Visit (INDEPENDENT_AMBULATORY_CARE_PROVIDER_SITE_OTHER): Payer: Medicare Other | Admitting: Endocrinology

## 2017-05-24 VITALS — BP 138/88 | HR 88 | Ht 62.0 in | Wt 202.0 lb

## 2017-05-24 DIAGNOSIS — R2681 Unsteadiness on feet: Secondary | ICD-10-CM | POA: Diagnosis not present

## 2017-05-24 DIAGNOSIS — R278 Other lack of coordination: Secondary | ICD-10-CM | POA: Diagnosis not present

## 2017-05-24 DIAGNOSIS — R41841 Cognitive communication deficit: Secondary | ICD-10-CM

## 2017-05-24 DIAGNOSIS — E041 Nontoxic single thyroid nodule: Secondary | ICD-10-CM

## 2017-05-24 DIAGNOSIS — R4701 Aphasia: Secondary | ICD-10-CM

## 2017-05-24 DIAGNOSIS — R946 Abnormal results of thyroid function studies: Secondary | ICD-10-CM | POA: Diagnosis not present

## 2017-05-24 DIAGNOSIS — I69353 Hemiplegia and hemiparesis following cerebral infarction affecting right non-dominant side: Secondary | ICD-10-CM | POA: Diagnosis not present

## 2017-05-24 DIAGNOSIS — R7989 Other specified abnormal findings of blood chemistry: Secondary | ICD-10-CM

## 2017-05-24 DIAGNOSIS — M6281 Muscle weakness (generalized): Secondary | ICD-10-CM | POA: Diagnosis not present

## 2017-05-24 DIAGNOSIS — R41842 Visuospatial deficit: Secondary | ICD-10-CM | POA: Diagnosis not present

## 2017-05-24 DIAGNOSIS — I639 Cerebral infarction, unspecified: Secondary | ICD-10-CM

## 2017-05-24 DIAGNOSIS — R2689 Other abnormalities of gait and mobility: Secondary | ICD-10-CM | POA: Diagnosis not present

## 2017-05-24 LAB — T4, FREE: FREE T4: 0.86 ng/dL (ref 0.60–1.60)

## 2017-05-24 LAB — TSH: TSH: 0.99 u[IU]/mL (ref 0.35–4.50)

## 2017-05-24 NOTE — Progress Notes (Signed)
Please call to let patient know that the thyroid  results are normal and no further action needed

## 2017-05-25 NOTE — Patient Instructions (Signed)
  Please complete the assigned speech therapy homework prior to your next session and return it to the speech therapist at your next visit.  

## 2017-05-25 NOTE — Therapy (Signed)
[facility] [address] [place], [place], [address] Phone: [phone]   Fax:  [phone]  Speech Language Pathology Treatment  Patient Details  Name: Sharon Chan MRN: [id number] Date of Birth: 1975/12/01 No Data Recorded  Encounter Date: 05/24/2017      End of Session - 05/25/17 0927    Visit Number 12   Number of Visits 27   Date for SLP Re-Evaluation 08/04/17   SLP Start Time 0933   SLP Stop Time  0375   SLP Time Calculation (min) 42 min   Activity Tolerance Patient tolerated treatment well      Past Medical History:  Diagnosis Date  . CVA (cerebral vascular accident) ([place])   . Polycystic disease, ovaries   . Seizures ([place])   . Sickle cell trait [facility])     Past Surgical History:  Procedure Laterality Date  . LAPAROSCOPIC CHOLECYSTECTOMY      There were no vitals filed for this visit.      Subjective Assessment - 05/24/17 0953    Subjective "The anxiety is so much less now, I'm just leaving it in the hands of God."               ADULT SLP TREATMENT - 05/25/17 0001      General Information   Behavior/Cognition Alert;Pleasant mood;Cooperative     Treatment Provided   Treatment provided Cognitive-Linquistic     Cognitive-Linquistic Treatment   Treatment focused on Cognition   Skilled Treatment Pt reports she has attempted cooking but with recipes needed to repeat measurements due to decr'd attention. SLP and pt talked about compensatory strategies for alternating attention and pt will cook a broccoli casserole before next session and will use compensations. In functional (math) written cognitive linguistic task today pt demonstrated splinter skills with emergent awareness however req'd SLP cues for double checking her work and finding errors due to tranposing numbers.     Assessment / Recommendations / Plan   Plan Continue with current plan of care;Goals updated     Progression  Toward Goals   Progression toward goals Progressing toward goals          SLP Education - 05/25/17 0927    Education provided Yes   Education Details need to double check herself with detailed work or work with numbers   [employer]) Educated Patient   Methods Explanation   Comprehension Verbalized understanding          SLP Short Term Goals - 05/25/17 0930      SLP SHORT TERM GOAL #1   Title Pt will tell four ways to compensate for anomia   Status Achieved     SLP SHORT TERM GOAL #2   Title pt will complete mod complex naming tasks independently with 90% success over three sessions   Time 1   Status Not Met     SLP SHORT TERM GOAL #3   Title pt will engage functionally in 10 minutes mod complex/complex conversation using compensatory strategies PRN over three sessions   Status Achieved     SLP [place] #4   Title pt will demo WFL alternating attention skills with two or more mod complex cognitive linguistic tasks with 95% success on both   Time 4   Period Weeks   Status On-going     SLP SHORT TERM GOAL #5   Title pt will use expressive language compensations in 10 minutes mod complex conversation over three sessions  Time 4   Period Weeks   Status On-going          SLP Long Term Goals - 05/25/17 0930      SLP LONG TERM GOAL #1   Title pt will engage functionally in 20 minutes mod complex -complex conversation outside [place] room, using compensatory strategies PRN over three sessions   Time 8   Period Weeks   Status On-going     SLP LONG TERM GOAL #2   Title pt will talk with SLP outside [place] room for 15 mintues, using compensatory strategies for anomia x2 sessions   Time 8   Period Weeks   Status On-going     SLP LONG TERM GOAL #3   Title pt will read 5-6 sentence paragraph stimuli and answer questions with 90% success and modified independence (compensations, self-correction)   Time 8   Period Weeks   Status On-going          Plan - 05/25/17  8786    Clinical Impression Statement Slowed speech rate in conversation continues due to difficulty with word finding/sentence construction from expressive aphasia. Higher level cognitive linguistic deficits were targeted today, pt educated she must always check herself with detailed or numeric tasks. Skilled ST is recommended to cont for 8 weeks, for improving her verbal expression as close to PLOF as possible, as well as increasing her high level attention ability.    Speech Therapy Frequency 2x / week   Duration --  8 weeks   Treatment/Interventions Language facilitation;Compensatory techniques;Internal/external aids;SLP instruction and feedback;Multimodal communcation approach;Patient/family education;Functional tasks;Cognitive reorganization   Potential to Achieve Goals Good      Patient will benefit from skilled therapeutic intervention in order to improve the following deficits and impairments:   Cognitive communication deficit  Aphasia    Problem List Patient Active Problem List   Diagnosis Date Noted  . Hemiparesis affecting right side as late effect of cerebrovascular accident ([place]) 03/23/2017  . Lacunar infarct, acute ([place]) 03/23/2017  . Visual disturbance as complication of stroke 76/72/0947  . Adjustment disorder with anxious mood   . 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 Sickle cell trait ([place])   . Hyperlipidemia   . Thyroid nodule   . Changes in vision   . Acute ischemic stroke ([place])   . Dysphagia, post-stroke   . Benign essential HTN   . Polycystic kidney disease   . Prediabetes   . Acute intractable headache   . Leukocytosis   . Stroke (cerebrum) ([place]) 02/25/2017    [facility] ,[place], [place]  05/25/2017, 9:31 AM  [facility] [address] [place], [place], [address] Phone: [phone]   Fax:  [phone]   Name: Sharon Chan MRN: [id number] Date  of Birth: 02/20/1976

## 2017-05-29 ENCOUNTER — Ambulatory Visit: Payer: Medicare Other | Admitting: Speech Pathology

## 2017-05-29 ENCOUNTER — Ambulatory Visit: Payer: Medicare Other | Admitting: Physical Therapy

## 2017-05-29 DIAGNOSIS — R41841 Cognitive communication deficit: Secondary | ICD-10-CM

## 2017-05-29 DIAGNOSIS — I69353 Hemiplegia and hemiparesis following cerebral infarction affecting right non-dominant side: Secondary | ICD-10-CM | POA: Diagnosis not present

## 2017-05-29 DIAGNOSIS — R2681 Unsteadiness on feet: Secondary | ICD-10-CM | POA: Diagnosis not present

## 2017-05-29 DIAGNOSIS — R41842 Visuospatial deficit: Secondary | ICD-10-CM | POA: Diagnosis not present

## 2017-05-29 DIAGNOSIS — R278 Other lack of coordination: Secondary | ICD-10-CM | POA: Diagnosis not present

## 2017-05-29 DIAGNOSIS — R2689 Other abnormalities of gait and mobility: Secondary | ICD-10-CM | POA: Diagnosis not present

## 2017-05-29 DIAGNOSIS — R4701 Aphasia: Secondary | ICD-10-CM

## 2017-05-29 DIAGNOSIS — M6281 Muscle weakness (generalized): Secondary | ICD-10-CM | POA: Diagnosis not present

## 2017-05-29 NOTE — Therapy (Signed)
[facility] [address] [place], [place], [address] Phone: [phone]   Fax:  [phone]  Speech Language Pathology Treatment  Patient Details  Name: Sharon Chan MRN: [id number] Date of Birth: 18-Feb-1976 No Data Recorded  Encounter Date: 05/29/2017      End of Session - 05/29/17 1832    Visit Number 13   Number of Visits 27   Date for SLP Re-Evaluation 08/04/17   SLP Start Time 0930   SLP Stop Time  5916   SLP Time Calculation (min) 45 min   Activity Tolerance Patient tolerated treatment well      Past Medical History:  Diagnosis Date  . CVA (cerebral vascular accident) ([place])   . Polycystic disease, ovaries   . Seizures ([place])   . Sickle cell trait [facility])     Past Surgical History:  Procedure Laterality Date  . LAPAROSCOPIC CHOLECYSTECTOMY      There were no vitals filed for this visit.      Subjective Assessment - 05/29/17 0933    Subjective "If I had to be here at 8:30, I'd have to get up at 4:30."   Currently in Pain? Yes   Pain Score 8    Pain Location Head   Pain Type Chronic pain   Pain Onset More than a month ago   Pain Frequency Constant   Aggravating Factors  noise, light, heat   Pain Relieving Factors medication               ADULT SLP TREATMENT - 05/29/17 0936      General Information   Behavior/Cognition Alert;Pleasant mood;Cooperative   Patient Positioning Upright in chair   Oral care provided N/A     Treatment Provided   Treatment provided Cognitive-Linquistic     Cognitive-Linquistic Treatment   Treatment focused on Cognition   Skilled Treatment SLP facilitated conversation re: therapy targets in recent weeks as it had been several weeks since she was seen by this therapist. Pt describes improvements in her verbal expression. She describes broccoli casserole she made with 1/2 cup of salt, and realized her error only after tasting the dish. She states she  reattempted the following day with success. SLP facilitated awareness of strategies which aided her in accurate completion of the task. Pt also describes difficulties with time management, stating it takes her 3-4 hours to get ready in the morning. With initial min A, pt began writing down her typical morning routine and breaking tasks into smaller steps, estimating the time it takes her to complete each task. SLP encouraged pt to reflect on why certain tasks now take her longer. Re: her shower, pt describes "washing everything at least 10 times." With min A from SLP, pt wrote a checklist to post in her shower. Pt to continue writing her morning routine and begin "troubleshooting" portions of the routine where her focus/attention delay her.      Assessment / Recommendations / Plan   Plan Continue with current plan of care     Progression Toward Goals   Progression toward goals Progressing toward goals          SLP Education - 05/29/17 1816    Education provided Yes   Education Details avoiding multi-tasking. use of timers, phone alerts vs interrupting a task   Person(s) Educated Patient   Methods Explanation   Comprehension Verbalized understanding          SLP Short Term Goals - 05/29/17 3846  SLP SHORT TERM GOAL #1   Status Achieved     SLP SHORT TERM GOAL #2   Title pt will complete mod complex naming tasks independently with 90% success over three sessions   Status Not Met     SLP SHORT TERM GOAL #3   Title pt will engage functionally in 10 minutes mod complex/complex conversation using compensatory strategies PRN over three sessions   Status Achieved     SLP SHORT TERM GOAL #4   Title pt will demo WFL alternating attention skills with two or more mod complex cognitive linguistic tasks with 95% success on both   Time 3   Period Weeks   Status On-going     SLP SHORT TERM GOAL #5   Title pt will use expressive language compensations in 10 minutes mod complex  conversation over three sessions   Time 3   Period Weeks   Status On-going          SLP Long Term Goals - 05/29/17 1834      SLP LONG TERM GOAL #1   Title pt will engage functionally in 20 minutes mod complex -complex conversation outside [place] room, using compensatory strategies PRN over three sessions   Time 7   Period Weeks   Status On-going     SLP LONG TERM GOAL #2   Title pt will talk with SLP outside [place] room for 15 mintues, using compensatory strategies for anomia x2 sessions   Time 7   Period Weeks   Status On-going     SLP LONG TERM GOAL #3   Title pt will read 5-6 sentence paragraph stimuli and answer questions with 90% success and modified independence (compensations, self-correction)   Time 7   Period Weeks          Plan - 05/29/17 1832    Clinical Impression Statement Slowed speech rate in conversation continues due to difficulty with word finding/sentence construction from expressive aphasia. Higher level cognitive linguistic deficits were targeted today, pt educated she must always check herself with detailed or numeric tasks. Skilled ST is recommended to cont for 8 weeks, for improving her verbal expression as close to PLOF as possible, as well as increasing her high level attention ability.    Speech Therapy Frequency 2x / week   Treatment/Interventions Language facilitation;Compensatory techniques;Internal/external aids;SLP instruction and feedback;Multimodal communcation approach;Patient/family education;Functional tasks;Cognitive reorganization   SLP Home Exercise Plan list tasks of morning routine and identify areas where attention impacts rate of completion   Consulted and Agree with Plan of Care Patient      Patient will benefit from skilled therapeutic intervention in order to improve the following deficits and impairments:   Cognitive communication deficit  Aphasia    Problem List Patient Active Problem List   Diagnosis Date Noted  .  Hemiparesis affecting right side as late effect of cerebrovascular accident ([place]) 03/23/2017  . Lacunar infarct, acute ([place]) 03/23/2017  . Visual disturbance as complication of stroke 21/22/4825  . Adjustment disorder with anxious mood   . 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 Sickle cell trait ([place])   . Hyperlipidemia   . Thyroid nodule   . Changes in vision   . Acute ischemic stroke ([place])   . Dysphagia, post-stroke   . Benign essential HTN   . Polycystic kidney disease   . Prediabetes   . Acute intractable headache   . Leukocytosis   . Stroke (cerebrum) ([place]) 02/25/2017  Deneise Lever, [place], [name] Speech-Language Pathologist  Aliene Altes 05/29/2017, 6:35 PM  [facility] [address] [place], [place], [address] Phone: [phone]   Fax:  [phone]   Name: Sharon Chan MRN: [id number] Date of Birth: 13-Aug-1976

## 2017-05-31 ENCOUNTER — Ambulatory Visit: Payer: Medicare Other | Admitting: Occupational Therapy

## 2017-05-31 ENCOUNTER — Ambulatory Visit: Payer: Medicare Other | Admitting: Rehabilitative and Restorative Service Providers"

## 2017-05-31 ENCOUNTER — Ambulatory Visit: Payer: Medicare Other

## 2017-05-31 DIAGNOSIS — R41840 Attention and concentration deficit: Secondary | ICD-10-CM

## 2017-05-31 DIAGNOSIS — R2689 Other abnormalities of gait and mobility: Secondary | ICD-10-CM | POA: Diagnosis not present

## 2017-05-31 DIAGNOSIS — I69353 Hemiplegia and hemiparesis following cerebral infarction affecting right non-dominant side: Secondary | ICD-10-CM | POA: Diagnosis not present

## 2017-05-31 DIAGNOSIS — M6281 Muscle weakness (generalized): Secondary | ICD-10-CM | POA: Diagnosis not present

## 2017-05-31 DIAGNOSIS — R4701 Aphasia: Secondary | ICD-10-CM

## 2017-05-31 DIAGNOSIS — R278 Other lack of coordination: Secondary | ICD-10-CM

## 2017-05-31 DIAGNOSIS — R2681 Unsteadiness on feet: Secondary | ICD-10-CM | POA: Diagnosis not present

## 2017-05-31 DIAGNOSIS — R41841 Cognitive communication deficit: Secondary | ICD-10-CM

## 2017-05-31 DIAGNOSIS — R41842 Visuospatial deficit: Secondary | ICD-10-CM | POA: Diagnosis not present

## 2017-05-31 DIAGNOSIS — R1310 Dysphagia, unspecified: Secondary | ICD-10-CM

## 2017-05-31 NOTE — Patient Instructions (Addendum)
     When you eat:  Turn your head to the right before you swallow  Alternate bites/sips  Try pills with applesauce instead of water (put pill on spoon and "bury" it in applesauce)
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
## 2017-06-05 ENCOUNTER — Ambulatory Visit: Payer: Medicare Other | Admitting: Occupational Therapy

## 2017-06-05 DIAGNOSIS — I69319 Unspecified symptoms and signs involving cognitive functions following cerebral infarction: Secondary | ICD-10-CM

## 2017-06-05 DIAGNOSIS — R278 Other lack of coordination: Secondary | ICD-10-CM

## 2017-06-05 DIAGNOSIS — R2681 Unsteadiness on feet: Secondary | ICD-10-CM | POA: Diagnosis not present

## 2017-06-05 DIAGNOSIS — M6281 Muscle weakness (generalized): Secondary | ICD-10-CM

## 2017-06-05 DIAGNOSIS — R208 Other disturbances of skin sensation: Secondary | ICD-10-CM

## 2017-06-05 DIAGNOSIS — R2689 Other abnormalities of gait and mobility: Secondary | ICD-10-CM

## 2017-06-05 DIAGNOSIS — R41840 Attention and concentration deficit: Secondary | ICD-10-CM

## 2017-06-05 DIAGNOSIS — R41842 Visuospatial deficit: Secondary | ICD-10-CM

## 2017-06-05 DIAGNOSIS — I69353 Hemiplegia and hemiparesis following cerebral infarction affecting right non-dominant side: Secondary | ICD-10-CM | POA: Diagnosis not present

## 2017-06-05 NOTE — Therapy (Signed)
[facility] [address] [place], [place], [address] Phone: [phone]   Fax:  [phone]  Occupational Therapy Treatment  Patient Details  Name: Sharon Chan MRN: [id number] Date of Birth: 08-20-1976 Referring Provider: Dr. Alysia Penna  Encounter Date: 06/05/2017      OT End of Session - 06/05/17 1110    Visit Number 23   Number of Visits 27   Date for OT Re-Evaluation 06/16/17   Authorization Type Medicare/Medicaid; g-code needed   Authorization Time Period renewal completed 5/83/09 with cert. date 05/11/17-07/09/17   Authorization - Visit Number 5   Authorization - Number of Visits 10   OT Start Time 1105   OT Stop Time 1145   OT Time Calculation (min) 40 min   Activity Tolerance Patient tolerated treatment well   Behavior During Therapy WFL for tasks assessed/performed      Past Medical History:  Diagnosis Date  . CVA (cerebral vascular accident) ([place])   . Polycystic disease, ovaries   . Seizures ([place])   . Sickle cell trait [facility])     Past Surgical History:  Procedure Laterality Date  . LAPAROSCOPIC CHOLECYSTECTOMY      There were no vitals filed for this visit.      Subjective Assessment - 06/05/17 1107    Subjective  Pt reports ongoing headaches.  Pt reports no new falls.   Pertinent History L CVA 02/25/17; HTN, polycystic kidney disease, hyperlipidemia, sickle cell trait, scans showed 2 old CVAs, sickle cell trait, prediabetes    Patient Stated Goals improve L hand strength/functional use, be able to chop/peel   Currently in Pain? Yes   Pain Score 8    Pain Location --  headache   Pain Descriptors / Indicators --  headache   Pain Type Chronic pain   Pain Onset More than a month ago   Pain Frequency Constant   Aggravating Factors  noise, incr activity   Pain Relieving Factors meds, essential oils       Pt report that she is going to [employer] 3x/wk  And swimming 3x/wk  typically.  Picking up blocks with gripper set on level 2 (black spring) for sustained grip strength with mod difficulty.  Picking up [place] pegs with tweezers for incr pinch strength, grading pressure, and coordination with min difficulty, improved with repetition.  Environmental scanning in busy environment with 13/15 items found, remaining found with 2nd pass.  Arm bike x6 min level 6 for conditioning without rest.  Discussed progress and difficulties with cooking and discussed strategies.                            OT Education - 06/05/17 1141    Education Details Updated theraband HEP to green x15 each   Person(s) Educated Patient   Methods Explanation;Demonstration   Comprehension Verbalized understanding;Returned demonstration          OT Short Term Goals - 05/08/17 1026      OT SHORT TERM GOAL #1   Title Pt will be independent with initial HEP.--check STGs 04/15/17   Time 4   Period Weeks   Status Achieved  04/06/17  met     OT SHORT TERM GOAL #2   Title Pt will improve R hand coordination/functional reaching as shown by improving score on box and blocks test by at least 10.   Baseline R-28blocks, L-52blocks   Time 4   Period Weeks  Status Achieved  04/13/17:  38 blocks     OT SHORT TERM GOAL #3   Title Pt will improve R hand coordination for ADLs as shown by improving time on 9-hole peg test by at least 10sec.   Baseline R-57.19sec   Time 4   Period Weeks   Status Achieved  04/13/17:  35.94sec     OT SHORT TERM GOAL #4   Title Pt will perform tabletop visual scanning with at least 95% accuracy with incr time.   Time 4   Period Weeks   Status Achieved  04/13/17:  inconsistent approx 78% with reading guide.  05/08/17  with 100% accuracy with reading guide     OT SHORT TERM GOAL #5   Title Pt will verbalize understanding of memory/visual compensation strategies.   Time 4   Period Weeks   Status Achieved  04/06/17 met     OT SHORT  TERM GOAL #6   Title Pt will improve R grip strength by at least 8lbs to assist with ADLs.   Baseline R-18lbs   Time 4   Period Weeks   Status Achieved  04/13/17:  28lbs     OT SHORT TERM GOAL #7   Title Pt will perform dressing/bathing without falls x 2 weeks utilizing safety strategies (supervision).   Time 4   Period Weeks   Status Achieved  04/13/17:  last fall in shower 04/04/17,  met 05/08/17           OT Long Term Goals - 06/05/17 1111      OT LONG TERM GOAL #1   Title Pt will be independent with updated HEP.--check LTGs 05/15/17   Time 8   Period Weeks   Status On-going  05/11/17  needs additional updates.     OT LONG TERM GOAL #2   Title Pt will perform simple cooking/home maintenance tasks mod I.--met initial goal, update 05/11/17 to mod complex cooking/home maintenance tasks mod I (including peeling and chopping).   Time 8   Period Weeks   Status On-going  05/11/17:  met and updated.      OT LONG TERM GOAL #3   Title Pt will improve coordination for ADLs as shown by improving time on 9-hole peg test by at least 20sec with RUE.--revised 04/13/17:  completing test in 28sec or less   Baseline R-57.19sec   Time 8   Period Weeks   Status Revised  04/13/17:  met initial goal and revised goal (35.94sec).  05/11/17  not met  33.75sec     OT LONG TERM GOAL #4   Title Pt will perform environmental scanning with at least 90% accuracy for incr safety in the community.--Met with min distracting environment.  Update goal 05/11/17 to 90% accuracy in mod distracting environement   Time 8   Period Weeks   Status Revised  05/11/17:  met and revised goal     OT LONG TERM GOAL #5   Title Pt will improve R grip strength by at least 15lbs to assist with ADLs.   Baseline R-18lbs   Time 8   Period Weeks   Status On-going  05/08/17:  10lbs (however, not consistent with functional performance during session).  05/11/17  10lbs     OT LONG TERM GOAL #6   Title Pt will be able to retrieve  3lb object from overhead shelf x3 safely with RUE.   Time 8   Period Weeks   Status Achieved  05/11/17  Plan - 06/05/17 1116    Clinical Impression Statement Pt is progressing towards goals and reports now she stays in kitchen until completed cooking due to forgeting to set timer and is using white board to mark off ingredients.  Pt is progressing towards remaining goals.   Rehab Potential Good   Current Impairments/barriers affecting progress: decr sensation, fall risk   OT Frequency 2x / week   OT Duration 4 weeks   OT Treatment/Interventions Self-care/ADL training;Moist Heat;Fluidtherapy;DME and/or AE instruction;Splinting;Patient/family education;Balance training;Therapeutic exercises;Contrast Bath;Ultrasound;Therapeutic exercise;Therapeutic activities;Cognitive remediation/compensation;Passive range of motion;Functional Mobility Training;Neuromuscular education;Cryotherapy;Electrical Stimulation;Parrafin;Energy conservation;Manual Therapy;Visual/perceptual remediation/compensation   Plan continue with strengthening and environmental scanning/navigation in busy environment, anticipate d/c next week   OT Home Exercise Plan Education provided:  03/28/17 coordination HEP, red putty HEP , red theraband issued 03/30/17. A/E recommendations, [employer] recommendations 05/04/17.    Consulted and Agree with Plan of Care Patient      Patient will benefit from skilled therapeutic intervention in order to improve the following deficits and impairments:  Decreased coordination, Impaired sensation, Decreased range of motion, Decreased safety awareness, Decreased activity tolerance, Decreased balance, Decreased knowledge of use of DME, Impaired UE functional use, Impaired vision/preception, Impaired perceived functional ability, Decreased strength, Decreased mobility, Decreased cognition  Visit Diagnosis: Hemiplegia and hemiparesis following cerebral infarction affecting right  non-dominant side (HCC)  Other lack of coordination  Visuospatial deficit  Attention and concentration deficit  Muscle weakness (generalized)  Other abnormalities of gait and mobility  Unsteadiness on feet  Unspecified symptoms and signs involving cognitive functions following cerebral infarction  Other disturbances of skin sensation    Problem List Patient Active Problem List   Diagnosis Date Noted  . Hemiparesis affecting right side as late effect of cerebrovascular accident ([place]) 03/23/2017  . Lacunar infarct, acute ([place]) 03/23/2017  . Visual disturbance as complication of stroke 52/77/8242  . Adjustment disorder with anxious mood   . 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 Sickle cell trait ([place])   . Hyperlipidemia   . Thyroid nodule   . Changes in vision   . Acute ischemic stroke ([place])   . Dysphagia, post-stroke   . Benign essential HTN   . Polycystic kidney disease   . Prediabetes   . Acute intractable headache   . Leukocytosis   . Stroke (cerebrum) ([place]) 02/25/2017    [facility] 06/05/2017, 11:53 AM  [facility] [address] [place], [place], [address] Phone: [phone]   Fax:  [phone]  Name: DYNASTI KERMAN MRN: [id number] Date of Birth: 08-04-1976   Vianne Bulls, OTR/L [facility] [address] [place],   [address] [phone] phone [phone] 06/05/17 11:53 AM

## 2017-06-07 ENCOUNTER — Ambulatory Visit: Payer: Medicare Other | Admitting: Occupational Therapy

## 2017-06-07 ENCOUNTER — Encounter: Payer: Self-pay | Admitting: Occupational Therapy

## 2017-06-07 DIAGNOSIS — R2681 Unsteadiness on feet: Secondary | ICD-10-CM

## 2017-06-07 DIAGNOSIS — M6281 Muscle weakness (generalized): Secondary | ICD-10-CM | POA: Diagnosis not present

## 2017-06-07 DIAGNOSIS — R41840 Attention and concentration deficit: Secondary | ICD-10-CM

## 2017-06-07 DIAGNOSIS — R278 Other lack of coordination: Secondary | ICD-10-CM

## 2017-06-07 DIAGNOSIS — R208 Other disturbances of skin sensation: Secondary | ICD-10-CM

## 2017-06-07 DIAGNOSIS — I69353 Hemiplegia and hemiparesis following cerebral infarction affecting right non-dominant side: Secondary | ICD-10-CM

## 2017-06-07 DIAGNOSIS — R41842 Visuospatial deficit: Secondary | ICD-10-CM

## 2017-06-07 DIAGNOSIS — R2689 Other abnormalities of gait and mobility: Secondary | ICD-10-CM | POA: Diagnosis not present

## 2017-06-07 NOTE — Therapy (Signed)
[facility] [address] [place], [place], [address] Phone: [phone]   Fax:  [phone]  Occupational Therapy Treatment  Patient Details  Name: Sharon Chan MRN: [id number] Date of Birth: [birth year] Referring Provider: Dr. Alysia Penna  Encounter Date: 06/07/2017      OT End of Session - 06/07/17 1214    Visit Number 24   Number of Visits 27   Date for OT Re-Evaluation 06/16/17   Authorization Type Medicare/Medicaid; g-code needed   Authorization Time Period renewal completed 3/82/50 with cert. date 05/11/17-07/09/17   Authorization - Visit Number 6   Authorization - Number of Visits 10   OT Start Time 1018   OT Stop Time 1100   OT Time Calculation (min) 42 min   Activity Tolerance Patient tolerated treatment well   Behavior During Therapy WFL for tasks assessed/performed      Past Medical History:  Diagnosis Date  . CVA (cerebral vascular accident) ([place])   . Polycystic disease, ovaries   . Seizures ([place])   . Sickle cell trait [facility])     Past Surgical History:  Procedure Laterality Date  . LAPAROSCOPIC CHOLECYSTECTOMY      There were no vitals filed for this visit.                    OT Treatments/Exercises (OP) - 06/07/17 0001      ADLs   ADL Comments Reviewed remaining long term goals, and patient has met / exceeded them all!  Discussed at length opportunity to consider peer support porgram in hospital - gave resources for volunteer services.  Also gave resources to aptient regarding stroke support group.  Patient extremely proud of her progress.                  OT Education - 06/07/17 1214    Education provided Yes   Education Details Reviewed remaining goals and progress.  [place] agreeable to discharge   Person(s) Educated Patient   Methods Explanation;Demonstration   Comprehension Verbalized understanding;Returned demonstration          OT Short Term  Goals - 05/08/17 1026      OT SHORT TERM GOAL #1   Title Pt will be independent with initial HEP.--check STGs 04/15/17   Time 4   Period Weeks   Status Achieved  04/06/17  met     OT SHORT TERM GOAL #2   Title Pt will improve R hand coordination/functional reaching as shown by improving score on box and blocks test by at least 10.   Baseline R-28blocks, L-52blocks   Time 4   Period Weeks   Status Achieved  04/13/17:  38 blocks     OT SHORT TERM GOAL #3   Title Pt will improve R hand coordination for ADLs as shown by improving time on 9-hole peg test by at least 10sec.   Baseline R-57.19sec   Time 4   Period Weeks   Status Achieved  04/13/17:  35.94sec     OT SHORT TERM GOAL #4   Title Pt will perform tabletop visual scanning with at least 95% accuracy with incr time.   Time 4   Period Weeks   Status Achieved  04/13/17:  inconsistent approx 78% with reading guide.  05/08/17  with 100% accuracy with reading guide     OT SHORT TERM GOAL #5   Title Pt will verbalize understanding of memory/visual compensation strategies.   Time 4   Period  Weeks   Status Achieved  04/06/17 met     OT SHORT TERM GOAL #6   Title Pt will improve R grip strength by at least 8lbs to assist with ADLs.   Baseline R-18lbs   Time 4   Period Weeks   Status Achieved  04/13/17:  28lbs     OT SHORT TERM GOAL #7   Title Pt will perform dressing/bathing without falls x 2 weeks utilizing safety strategies (supervision).   Time 4   Period Weeks   Status Achieved  04/13/17:  last fall in shower 04/04/17,  met 05/08/17           OT Long Term Goals - 2017/07/02 1043      OT LONG TERM GOAL #1   Title Pt will be independent with updated HEP.--check LTGs 05/15/17   Status Achieved     OT LONG TERM GOAL #2   Title Pt will perform simple cooking/home maintenance tasks mod I.--met initial goal, update 05/11/17 to mod complex cooking/home maintenance tasks mod I (including peeling and chopping).   Status Achieved      OT LONG TERM GOAL #3   Title Pt will improve coordination for ADLs as shown by improving time on 9-hole peg test by at least 20sec with RUE.--revised 04/13/17:  completing test in 28sec or less   Status Achieved  24.53!     OT LONG TERM GOAL #4   Title Pt will perform environmental scanning with at least 90% accuracy for incr safety in the community.--Met with min distracting environment.  Update goal 05/11/17 to 90% accuracy in mod distracting environement   Status Achieved     OT LONG TERM GOAL #5   Title Pt will improve R grip strength by at least 15lbs to assist with ADLs.   Status Achieved  35 lbs!     OT LONG TERM GOAL #6   Title Pt will be able to retrieve 3lb object from overhead shelf x3 safely with RUE.   Status Achieved               Plan - Jul 02, 2017 1215    Clinical Impression Statement Patient has met all long term goals and is agreeable to OT discharge.     Rehab Potential Good   Current Impairments/barriers affecting progress: decr sensation, fall risk   OT Frequency 2x / week   OT Duration 4 weeks   OT Treatment/Interventions Self-care/ADL training;Moist Heat;Fluidtherapy;DME and/or AE instruction;Splinting;Patient/family education;Balance training;Therapeutic exercises;Contrast Bath;Ultrasound;Therapeutic exercise;Therapeutic activities;Cognitive remediation/compensation;Passive range of motion;Functional Mobility Training;Neuromuscular education;Cryotherapy;Electrical Stimulation;Parrafin;Energy conservation;Manual Therapy;Visual/perceptual remediation/compensation   Plan discharge   OT Home Exercise Plan Education provided:  03/28/17 coordination HEP, red putty HEP , red theraband issued 03/30/17. A/E recommendations, Lifeline recommendations 05/04/17.    Consulted and Agree with Plan of Care Patient      Patient will benefit from skilled therapeutic intervention in order to improve the following deficits and impairments:     Visit Diagnosis: Hemiplegia  and hemiparesis following cerebral infarction affecting right non-dominant side (HCC)  Other lack of coordination  Visuospatial deficit  Attention and concentration deficit  Muscle weakness (generalized)  Unsteadiness on feet  Other disturbances of skin sensation      G-Codes - 2017-07-02 1216    Functional Assessment Tool Used (Outpatient only) 9-hole peg test R-35.94 secs.  Box and blocks test:RUE  38 blocks.  R grip strength 28lbs   Functional Limitation Carrying, moving and handling objects   Carrying, Moving and Handling Objects Current Status [phone])  At least 20 percent but less than 40 percent impaired, limited or restricted   Carrying, Moving and Handling Objects Goal Status [phone]) At least 20 percent but less than 40 percent impaired, limited or restricted   Carrying, Moving and Handling Objects Discharge Status [phone]) At least 20 percent but less than 40 percent impaired, limited or restricted      Problem List Patient Active Problem List   Diagnosis Date Noted  . Hemiparesis affecting right side as late effect of cerebrovascular accident ([place]) 03/23/2017  . Lacunar infarct, acute ([place]) 03/23/2017  . Visual disturbance as complication of stroke 06/34/9494  . Adjustment disorder with anxious mood   . 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 Sickle cell trait ([place])   . Hyperlipidemia   . Thyroid nodule   . Changes in vision   . Acute ischemic stroke ([place])   . Dysphagia, post-stroke   . Benign essential HTN   . Polycystic kidney disease   . Prediabetes   . Acute intractable headache   . Leukocytosis   . Stroke (cerebrum) ([place]) 02/25/2017   OCCUPATIONAL THERAPY DISCHARGE SUMMARY  Visits from Start of Care: 24  Current functional level related to goals / functional outcomes: Patient has returned to independent living   Remaining deficits: Decreased sensation, decreased alternating, divided attention, decreased vision right,  increased anxiety, decreased balance  Education / Equipment: HEP Plan: Patient agrees to discharge.  Patient goals were met. Patient is being discharged due to meeting the stated rehab goals.  ?????        Mariah Milling, OTR/L 06/07/2017, 12:17 PM  [facility] [address] [place], [place], [address] Phone: [phone]   Fax:  [phone]  Name: ZELTA ENFIELD MRN: [id number] Date of Birth: Mar 23, 1976

## 2017-06-08 ENCOUNTER — Ambulatory Visit: Payer: Medicare Other | Admitting: Speech Pathology

## 2017-06-08 DIAGNOSIS — R2681 Unsteadiness on feet: Secondary | ICD-10-CM | POA: Diagnosis not present

## 2017-06-08 DIAGNOSIS — R4701 Aphasia: Secondary | ICD-10-CM

## 2017-06-08 DIAGNOSIS — R41842 Visuospatial deficit: Secondary | ICD-10-CM | POA: Diagnosis not present

## 2017-06-08 DIAGNOSIS — R1310 Dysphagia, unspecified: Secondary | ICD-10-CM

## 2017-06-08 DIAGNOSIS — I69353 Hemiplegia and hemiparesis following cerebral infarction affecting right non-dominant side: Secondary | ICD-10-CM | POA: Diagnosis not present

## 2017-06-08 DIAGNOSIS — M6281 Muscle weakness (generalized): Secondary | ICD-10-CM | POA: Diagnosis not present

## 2017-06-08 DIAGNOSIS — R2689 Other abnormalities of gait and mobility: Secondary | ICD-10-CM | POA: Diagnosis not present

## 2017-06-08 DIAGNOSIS — R41841 Cognitive communication deficit: Secondary | ICD-10-CM

## 2017-06-08 DIAGNOSIS — R278 Other lack of coordination: Secondary | ICD-10-CM | POA: Diagnosis not present

## 2017-06-08 NOTE — Therapy (Signed)
[facility] [address] [place], [place], [address] Phone: [phone]   Fax:  [phone]  Speech Language Pathology Treatment  Patient Details  Name: Sharon Chan MRN: [id number] Date of Birth: 04/20/1976 No Data Recorded  Encounter Date: 06/08/2017    Past Medical History:  Diagnosis Date  . CVA (cerebral vascular accident) ([place])   . Polycystic disease, ovaries   . Seizures ([place])   . Sickle cell trait [facility])     Past Surgical History:  Procedure Laterality Date  . LAPAROSCOPIC CHOLECYSTECTOMY      There were no vitals filed for this visit.      Subjective Assessment - 06/08/17 1021    Subjective Pt reports she cooked every day this week, has been staying in the kitchen while cooking.   Currently in Pain? Yes   Pain Score 8    Pain Location Head   Pain Type Chronic pain   Pain Onset More than a month ago               ADULT SLP TREATMENT - 06/08/17 1023      General Information   Behavior/Cognition Alert;Pleasant mood;Cooperative   Patient Positioning Upright in chair   Oral care provided N/A     Treatment Provided   Treatment provided Dysphagia     Dysphagia Treatment   Temperature Spikes Noted No   Respiratory Status Room air   Oral Cavity - Dentition Adequate natural dentition   Treatment Methods Skilled observation;Compensation strategy training;Patient/caregiver education   Patient observed directly with PO's Yes   Type of PO's observed Regular;Thin liquids  Mixed consistency solid (tangerine slice)   Feeding Able to feed self   Liquids provided via Straw   Type of cueing Verbal;Visual   Amount of cueing Minimal   Other treatment/comments SLP facilitated pt's recall of swallowing precautions, compensations for reduced right-sided sensation and weakness. Pt verbally states precautions though she requires initial min A to implement techniques with PO. She reports  difficulties with liquids, solids and mixed consistencies. with mixed consistency (juicy slice of tangerine), pt with immediate throat clear, delayed cough, suggestive of reduced airway protection. No overt signs of aspiration noted with liquids or solids with use of compensations, however pt endorses globus sensation at thyroid notch.  Administered Eating Assessment Tool (EAT-10) and score is 33/40 ( <3 is normal), Reflux Symptom Index score 37/45 ( >5 is abnormal).      Assessment / Recommendations / Plan   Plan Continue with current plan of care     Dysphagia Recommendations   Diet recommendations Dysphagia 3 (mechanical soft);Thin liquid;Other(comment)  avoid mixed consistencies   Liquids provided via Straw   Medication Administration Whole meds with puree   Supervision Patient able to self feed   Compensations Slow rate;Small sips/bites;Follow solids with liquid   Postural Changes and/or Swallow Maneuvers Head turn right during swallow  Tilt head to left during mastication     General Recommendations   General recommendations Other(comment)  MBS   Oral Care Recommendations Oral care BID     Progression Toward Goals   Progression toward goals Progressing toward goals            SLP Short Term Goals - 06/08/17 1028      SLP SHORT TERM GOAL #1   Title Pt will tell four ways to compensate for anomia   Status Achieved     SLP SHORT TERM GOAL #2   Title pt will complete  mod complex naming tasks independently with 90% success over three sessions   Status Not Met     SLP SHORT TERM GOAL #3   Title pt will engage functionally in 10 minutes mod complex/complex conversation using compensatory strategies PRN over three sessions   Status Achieved     SLP [place] #4   Title pt will demo WFL alternating attention skills with two or more mod complex cognitive linguistic tasks with 95% success on both   Time 2   Period Weeks   Status On-going     SLP SHORT TERM GOAL #5    Title pt will use expressive language compensations in 10 minutes mod complex conversation over three sessions   Time 2   Period Weeks   Status On-going     SLP SHORT TERM GOAL #6   Title pt will perform swallow precautions with modified indpendence over 2 sessions   Baseline 06/08/17,    Time 2   Period Weeks   Status On-going          SLP Long Term Goals - 06/08/17 1030      SLP LONG TERM GOAL #1   Title pt will engage functionally in 20 minutes mod complex -complex conversation outside [place] room, using compensatory strategies PRN over three sessions   Time 6   Period Weeks   Status On-going     SLP LONG TERM GOAL #2   Title pt will talk with SLP outside [place] room for 15 mintues, using compensatory strategies for anomia x2 sessions   Time 6   Period Weeks   Status On-going     SLP LONG TERM GOAL #3   Title pt will read 5-6 sentence paragraph stimuli and answer questions with 90% success and modified independence (compensations, self-correction)   Time 6   Period Weeks     SLP LONG TERM GOAL #4   Title pt will demo swallow precautions with POs in 3 sessions   Baseline 7.26.18   Time 6   Period Weeks   Status On-going          Plan - 06/08/17 1332    Clinical Impression Statement Pt presents today with clinical signs of aspiration of mixed consistency (soft solid/thin liquid). Recommend pt avoid mixed consistency solids pending instrumental evaluation of swallowing function. Given her score of 37 on Reflux Symptom Index and complaints including globus sensation, difficulty with solids, coughing after eating or after lying down at night, suspect pt may have esophageal component to her dysphagia. Pt has not yet had an instrumental assessment to evaluate swallowing function (she was evaluated at bedside vs instrumental testing during her hospital stay), but given her clinical signs of aspiration, complaints of dysphagia and ? esophageal component, recommend MBS for objective  visualization of swallowing impairments to aid in determining safest, least restrictive diet, identify effectiveness of compensatory swallow maneuvers, and to aid in treatment planning, determine appropriateness of exercises to improve swallow fx. Skilled ST is recommended to cont, for improving her verbal expression as close to PLOF as possible, as well as increasing her high level attention ability. MBS recommended and swallow HEP may be warranted following that assessment.    Speech Therapy Frequency 2x / week   Treatment/Interventions Language facilitation;Compensatory techniques;Internal/external aids;SLP instruction and feedback;Multimodal communcation approach;Patient/family education;Functional tasks;Cognitive reorganization;Aspiration precaution training;Pharyngeal strengthening exercises;Diet toleration management by SLP;Cueing hierarchy   Potential to Achieve Goals Good   SLP Home Exercise Plan list tasks of morning routine and identify areas  where attention impacts rate of completion   Consulted and Agree with Plan of Care Patient      Patient will benefit from skilled therapeutic intervention in order to improve the following deficits and impairments:   Dysphagia, unspecified type  Cognitive communication deficit  Aphasia    Problem List Patient Active Problem List   Diagnosis Date Noted  . Hemiparesis affecting right side as late effect of cerebrovascular accident ([place]) 03/23/2017  . Lacunar infarct, acute ([place]) 03/23/2017  . Visual disturbance as complication of stroke 58/25/1898  . Adjustment disorder with anxious mood   . 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 Sickle cell trait ([place])   . Hyperlipidemia   . Thyroid nodule   . Changes in vision   . Acute ischemic stroke ([place])   . Dysphagia, post-stroke   . Benign essential HTN   . Polycystic kidney disease   . Prediabetes   . Acute intractable headache   . Leukocytosis   . Stroke  (cerebrum) ([place]) 02/25/2017   Deneise Lever, [place], [place] 06/08/2017, 1:35 PM  [facility] [address] [place], [place], [address] Phone: [phone]   Fax:  [phone]   Name: Sharon Chan MRN: [id number] Date of Birth: 1976-08-15

## 2017-06-12 ENCOUNTER — Ambulatory Visit: Payer: Medicare Other | Admitting: Speech Pathology

## 2017-06-12 DIAGNOSIS — R4701 Aphasia: Secondary | ICD-10-CM

## 2017-06-12 DIAGNOSIS — I69353 Hemiplegia and hemiparesis following cerebral infarction affecting right non-dominant side: Secondary | ICD-10-CM | POA: Diagnosis not present

## 2017-06-12 DIAGNOSIS — R278 Other lack of coordination: Secondary | ICD-10-CM | POA: Diagnosis not present

## 2017-06-12 DIAGNOSIS — R2689 Other abnormalities of gait and mobility: Secondary | ICD-10-CM | POA: Diagnosis not present

## 2017-06-12 DIAGNOSIS — R41842 Visuospatial deficit: Secondary | ICD-10-CM | POA: Diagnosis not present

## 2017-06-12 DIAGNOSIS — R2681 Unsteadiness on feet: Secondary | ICD-10-CM | POA: Diagnosis not present

## 2017-06-12 DIAGNOSIS — M6281 Muscle weakness (generalized): Secondary | ICD-10-CM | POA: Diagnosis not present

## 2017-06-12 DIAGNOSIS — R41841 Cognitive communication deficit: Secondary | ICD-10-CM

## 2017-06-12 NOTE — Therapy (Signed)
[facility] [address] [place], [place], [address] Phone: [phone]   Fax:  [phone]  Speech Language Pathology Treatment  Patient Details  Name: Sharon Chan MRN: [id number] Date of Birth: Feb 21, 1976 No Data Recorded  Encounter Date: 06/12/2017      End of Session - 06/12/17 1029    Visit Number 15   Number of Visits 27   Date for SLP Re-Evaluation 08/04/17   SLP Start Time 1016   SLP Stop Time  1100   SLP Time Calculation (min) 44 min   Activity Tolerance Patient tolerated treatment well      Past Medical History:  Diagnosis Date  . CVA (cerebral vascular accident) ([place])   . Polycystic disease, ovaries   . Seizures ([place])   . Sickle cell trait [facility])     Past Surgical History:  Procedure Laterality Date  . LAPAROSCOPIC CHOLECYSTECTOMY      There were no vitals filed for this visit.      Subjective Assessment - 06/12/17 1020    Subjective "Yesterday for some reason I couldn't talk"   Currently in Pain? Yes   Pain Score 8    Pain Location Head   Pain Type Chronic pain   Pain Onset More than a month ago               ADULT SLP TREATMENT - 06/12/17 1015      General Information   Behavior/Cognition Alert;Pleasant mood;Cooperative   Patient Positioning Upright in chair   Oral care provided N/A     Treatment Provided   Treatment provided Cognitive-Linquistic     Cognitive-Linquistic Treatment   Treatment focused on Cognition   Skilled Treatment SLP facilitated 10 minutes mod-complex conversation x2; pt utilized compensations for expressive language functionally. Pt stated she had difficulties with expression yesterday; SLP provided education re: strategies to reduce pressure, stress, as well as increase fluency (writing down her prayers and reading if she is having difficulty verbalizing). Targeted alternating attention via written instructions for 2 recipes, pt required  extended time and min A to identify missing ingredients.      Assessment / Recommendations / Plan   Plan Continue with current plan of care     Progression Toward Goals   Progression toward goals Progressing toward goals          SLP Education - 06/12/17 1058    Education provided Yes   Education Details Strategies to reduce pressure, compensations for expressive language   Person(s) Educated Patient   Methods Explanation   Comprehension Verbalized understanding          SLP Short Term Goals - 06/12/17 1029      SLP SHORT TERM GOAL #1   Title Pt will tell four ways to compensate for anomia   Status Achieved     SLP SHORT TERM GOAL #2   Title pt will complete mod complex naming tasks independently with 90% success over three sessions   Status Not Met     SLP SHORT TERM GOAL #3   Title pt will engage functionally in 10 minutes mod complex/complex conversation using compensatory strategies PRN over three sessions   Status Achieved     SLP SHORT TERM GOAL #4   Title pt will demo WFL alternating attention skills with two or more mod complex cognitive linguistic tasks with 95% success on both   Time 2   Period Weeks   Status On-going     SLP  SHORT TERM GOAL #5   Title pt will use expressive language compensations in 10 minutes mod complex conversation over three sessions   Baseline 7.30.18   Time 2   Period Weeks   Status On-going          SLP Long Term Goals - 06/12/17 1045      SLP LONG TERM GOAL #1   Title pt will engage functionally in 20 minutes mod complex -complex conversation outside [place] room, using compensatory strategies PRN over three sessions   Time 5   Period Weeks   Status On-going     SLP LONG TERM GOAL #2   Title pt will talk with SLP outside [place] room for 15 mintues, using compensatory strategies for anomia x2 sessions   Time 5   Period Weeks   Status On-going     SLP LONG TERM GOAL #3   Title pt will read 5-6 sentence paragraph stimuli and  answer questions with 90% success and modified independence (compensations, self-correction)   Time 5   Period Weeks   Status On-going     SLP LONG TERM GOAL #4   Title pt will demo swallow precautions with POs in 3 sessions   Baseline 7.26.18   Time 5   Period Weeks   Status On-going          Plan - 06/12/17 1307    Clinical Impression Statement Patient continues to complain of difficulties with verbal expression, particularly in higher-pressure speaking situations. Utilized compensations for expressive language in 2 10 minutes mod-complex conversations today. Requires extended time and min A for alternating attention. Skilled ST is recommended to cont, for improving her verbal expression as close to PLOF as possible, as well as increasing her high level attention ability. MBS recommended and swallow HEP may be warranted following that assessment.    Speech Therapy Frequency 2x / week   Treatment/Interventions Language facilitation;Compensatory techniques;Internal/external aids;SLP instruction and feedback;Multimodal communcation approach;Patient/family education;Functional tasks;Cognitive reorganization;Aspiration precaution training;Pharyngeal strengthening exercises;Diet toleration management by SLP;Cueing hierarchy   Potential to Achieve Goals Good   SLP Home Exercise Plan list tasks of morning routine and identify areas where attention impacts rate of completion   Consulted and Agree with Plan of Care Patient      Patient will benefit from skilled therapeutic intervention in order to improve the following deficits and impairments:   Aphasia  Cognitive communication deficit    Problem List Patient Active Problem List   Diagnosis Date Noted  . Hemiparesis affecting right side as late effect of cerebrovascular accident ([place]) 03/23/2017  . Lacunar infarct, acute ([place]) 03/23/2017  . Visual disturbance as complication of stroke 54/65/0354  . Adjustment disorder with anxious  mood   . 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 Sickle cell trait ([place])   . Hyperlipidemia   . Thyroid nodule   . Changes in vision   . Acute ischemic stroke ([place])   . Dysphagia, post-stroke   . Benign essential HTN   . Polycystic kidney disease   . Prediabetes   . Acute intractable headache   . Leukocytosis   . Stroke (cerebrum) ([place]) 02/25/2017   Deneise Lever, [place], [place] E Aleighya Mcanelly 06/12/2017, 1:09 PM  [facility] [address] [place], [place], [address] Phone: [phone]   Fax:  [phone]   Name: Sharon Chan MRN: [id number] Date of Birth: 07/05/1976

## 2017-06-14 ENCOUNTER — Encounter: Payer: Medicare Other | Admitting: Occupational Therapy

## 2017-06-16 ENCOUNTER — Encounter: Payer: Medicare Other | Admitting: Occupational Therapy

## 2017-06-16 ENCOUNTER — Ambulatory Visit: Payer: Medicare Other

## 2017-06-17 IMAGING — CT CT ANGIO NECK
2 of 8 series · 8 of 33 positions shown · IV contrast (Iohexol (Omnipaque 350))
Comparison: Head CT without contrast 6448 hours today.

ADDENDUM:
Preliminary report of this exam was discussed by telephone with Dr.
Gunnar Erik Vea on 02/25/2017 at 2121 hours.
CLINICAL DATA: 41-year-old female with RIGHT side weakness.
Confusion. Sickle cell disease.

EXAM:
CT ANGIOGRAPHY HEAD AND NECK
TECHNIQUE: Multidetector CT imaging of the head and neck was performed using
the standard protocol during bolus administration of intravenous
contrast. Multiplanar CT image reconstructions and MIPs were
obtained to evaluate the vascular anatomy. Carotid stenosis
measurements (when applicable) are obtained utilizing NASCET
criteria, using the distal internal carotid diameter as the
denominator.
CONTRAST:  100 mL Isovue 370

[Series 402: ax 1x1 mip, idose (1) · axial · 0.50mm/px · z∈[-44,+74]mm · 2 of 356 slices shown]
[im 119/356  soft-tissue]
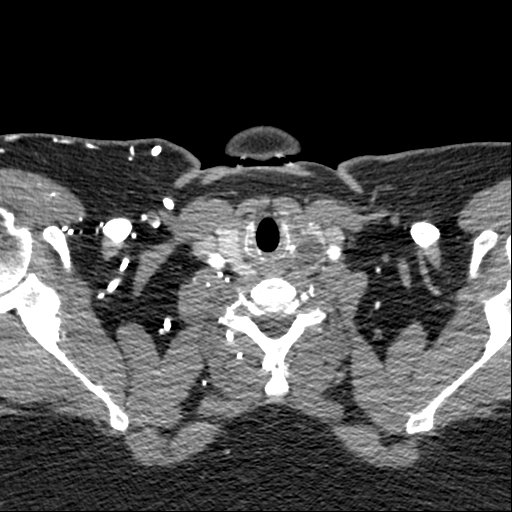
[im 237/356  soft-tissue]
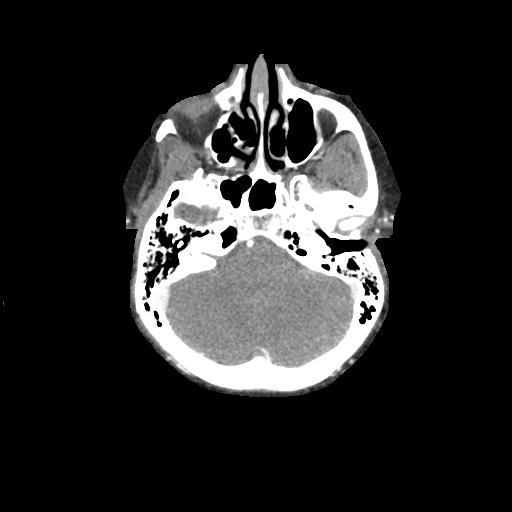

[Series 408: carotid thin, idose (1) · axial · 0.50mm/px · z∈[-111,+144]mm · 6 of 715 slices shown]
[im 103/715  soft-tissue]
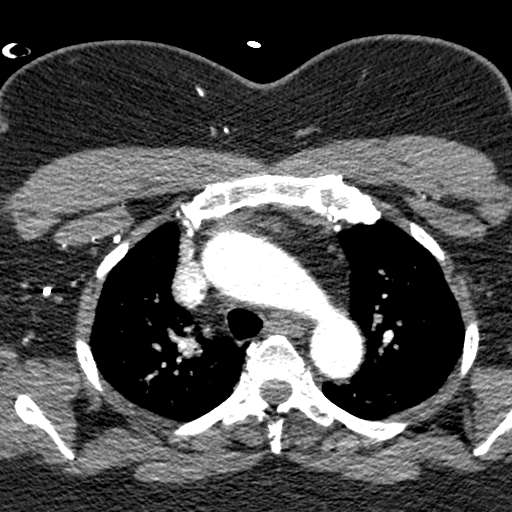
[im 205/715  bone]
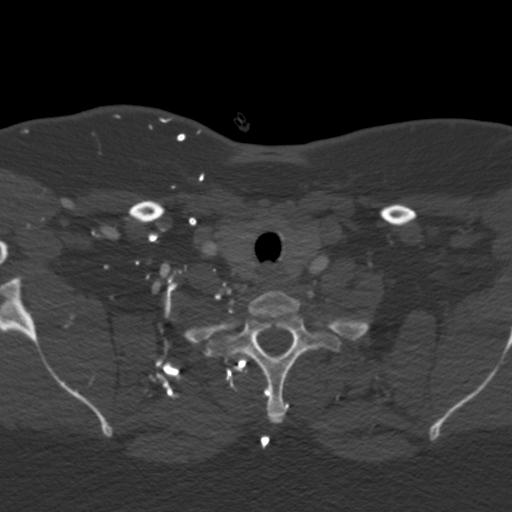
[im 307/715  soft-tissue]
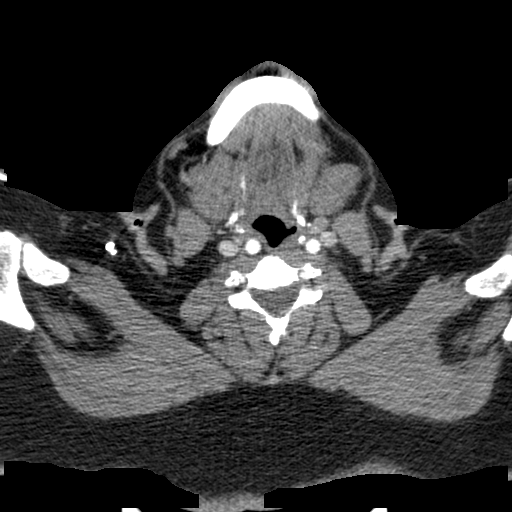
[im 409/715  bone]
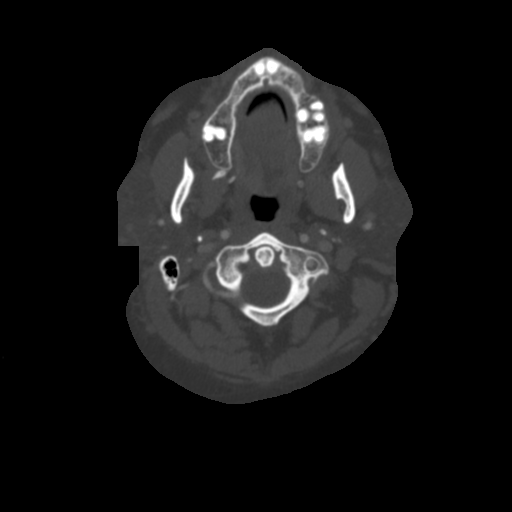
[im 511/715  soft-tissue]
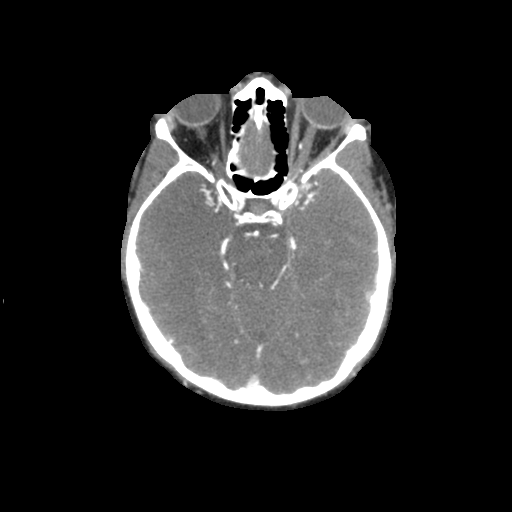
[im 613/715  bone]
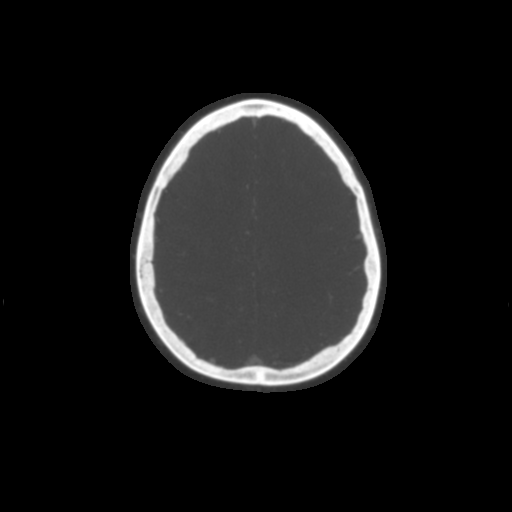

[8 of 33 positions shown; findings below may reference images not displayed]

FINDINGS: CTA NECK

Skeleton: No acute osseous abnormality identified. Cervical and
thoracic endplates are relatively preserved.

Visualized paranasal sinuses and mastoids are stable and well
pneumatized.

Upper chest: Partially visible cardiomegaly. Negative visualized
thoracic aorta. No superior mediastinal lymphadenopathy. Negative
visible lung parenchyma except for atelectasis.

Other neck: Thyroid remarkable for 18 mm hypodense nodule on the
left.

Negative larynx, pharynx, parapharyngeal spaces, retropharyngeal
space (partially retropharyngeal right carotid), sublingual space,
submandibular glands and parotid glands. No cervical
lymphadenopathy.

Aortic arch: 3 vessel arch configuration, no atherosclerosis.

Enhancement of small venous collaterals about the right shoulder. A
right IJ approach porta cath is in place, and there appears to be
functional stenosis of the right subclavian artery.

Right carotid system: Mildly tortuous brachiocephalic artery with no
stenosis. Negative proximal right CCA. Partially retropharyngeal
right carotid bifurcation. Negative cervical right ICA aside from
mild tortuosity.

Left carotid system: Negative.

Vertebral arteries:No proximal right subclavian stenosis. Non
dominant right vertebral artery. Right vertebral artery origin and
cervical right vertebral arteries are within normal limits.

No proximal left subclavian artery stenosis. Dominant left vertebral
artery. No left vertebral origin stenosis. Tortuous left V1 segment.
Mildly tortuous left V2 segment, no stenosis to the skullbase.

CTA HEAD

Posterior circulation: Distal left vertebral artery is dominant. No
distal vertebral stenosis. Both PICA origins are patent. No basilar
stenosis. Normal SCA origins. Normal left PCA origin. Fetal type
right PCA. Left posterior communicating artery is present.

Mild right P1 and P2 and moderate to severe left P1 and P2,
irregularity and stenosis as seen on series 407, image 52. Preserved
bilateral distal PCA flow.

Anterior circulation: Both ICA siphons are patent without
atherosclerosis or stenosis. Normal ophthalmic and posterior
communicating artery origins. Patent carotid termini.

However, there is moderate irregularity and stenosis at the right M1
origin (series 403, image 108). The right MCA M1 segment is patent
but there is moderate to severe irregularity and stenosis also in
the mid M1 (series 403, image 103 and see also series 407, image
52). Despite this the right MCA bifurcation is patent. No right M2
branch occlusion identified. Right MCA M2 and M3 branches are within
normal limits.

Left MCA origin and proximal M1 segment are normal. There is mild
irregularity in the mid left M1 segment. The left MCA bifurcation is
patent. There is moderate left M2 and moderate to severe left M3
irregularity and stenosis as seen on series 406, image 32.

ACA origins and anterior communicating arteries are within normal
limits. Proximal A2 branches are within normal limits but there is
severe left ACA A2 segment stenosis near the callosomarginal artery,
and moderate to severe bilateral distal ACA/pericallosal artery
irregularity and stenosis (series 406, image 25).

Venous sinuses: Patent.

Anatomic variants: Dominant left vertebral artery. Fetal type right
PCA origin.

Review of the MIP images confirms the above findings
IMPRESSION: 1. No atherosclerosis or stenosis in the neck, ICA siphons, or
basilar artery. But there is widespread first and second order
circle of Willis branch irregularity and stenosis. Still, there is
no emergent large vessel occlusion or target for endovascular
intervention identified.
2. Moderate or severe stenoses of the:
- Left PCA P1 and P2,
- Right MCA origin and M1,
- Left ACA A2 and bilateral distal pericallosal arteries,
- Left MCA M2 and M3.
3. Left thyroid 18 mm nodule meets consensus criteria for
nonemergent follow-up Thyroid Ultrasound characterization.

## 2017-06-17 IMAGING — CT CT HEAD CODE STROKE
1 of 10 series · 1 of 33 positions shown, 2 images · IV contrast (Iohexol (Omnipaque 350))
Comparison: Report of brain MRI 07/11/1999 (no images available).

CLINICAL DATA: Code stroke. 41-year-old female last seen normal
this morning. Left facial droop and left side numbness. Initial
encounter. Sickle cell disease

EXAM:
CT HEAD WITHOUT CONTRAST
TECHNIQUE: Contiguous axial images were obtained from the base of the skull
through the vertex without intravenous contrast.

[Series 200: locator · axial · 0.49mm/px · z∈[-140,-140]mm · 1 of 1 slices shown, 2 images]
[im 1/1  soft-tissue]
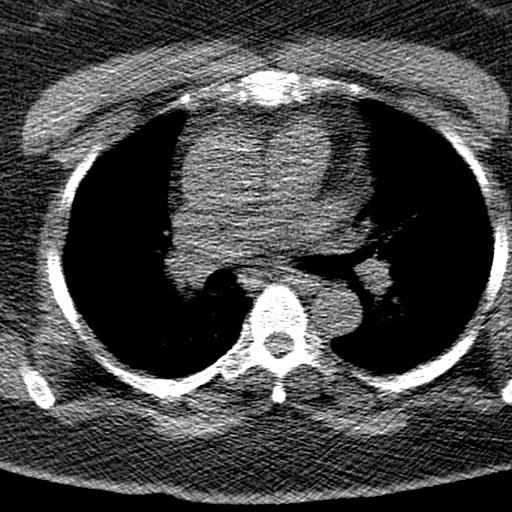
[im 1/1  bone]
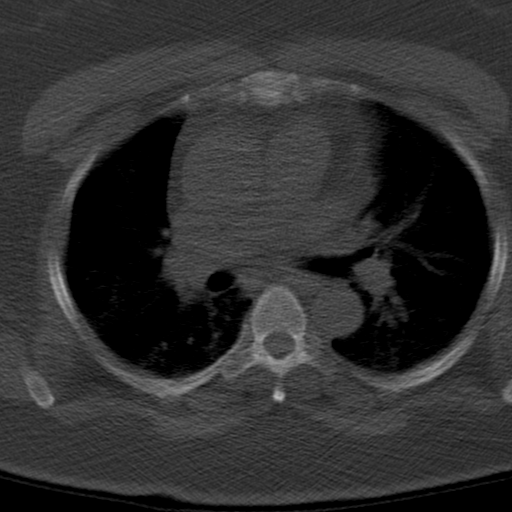

[1 of 33 positions shown; findings below may reference images not displayed]

FINDINGS: Brain: Overall normal cerebral volume. Chronic appearing lacunar
infarct right basal ganglia. Small age indeterminate hypodensity
left thalamus (series 21, image 14). Focal chronic appearing lacunar
infarct in the body of the corpus callosum (coronal image 34). Other
small areas of cerebral white matter hypodensity.

No midline shift, ventriculomegaly, mass effect, evidence of mass
lesion, intracranial hemorrhage or evidence of cortically based
acute infarction.

Vascular: No suspicious intracranial vascular hyperdensity.

Skull: Negative.

Sinuses/Orbits: Clear.

Other: Negative orbit and scalp soft tissues.

ASPECTS ([place]ber[place] Stroke Program Early CT Score)

- Ganglionic level infarction (caudate, lentiform nuclei, internal
capsule, insula, M1-M3 cortex): 7

- Supraganglionic infarction (M4-M6 cortex): 3

Total score (0-10 with 10 being normal): 10
IMPRESSION: 1. No acute cortically based infarct or acute intracranial
hemorrhage. ASPECTS is 10.
2. Evidence of scattered small vessel ischemia, with chronic
appearing involvement of the corpus callosum and right basal
ganglia. Small age indeterminate area in the left thalamus.
3. The above was relayed via text pager to Dr. Nala Tiger on

## 2017-06-19 ENCOUNTER — Ambulatory Visit: Payer: Medicare Other | Attending: Physical Medicine & Rehabilitation | Admitting: Speech Pathology

## 2017-06-19 DIAGNOSIS — R1310 Dysphagia, unspecified: Secondary | ICD-10-CM | POA: Insufficient documentation

## 2017-06-19 DIAGNOSIS — R4701 Aphasia: Secondary | ICD-10-CM | POA: Diagnosis not present

## 2017-06-19 DIAGNOSIS — R41841 Cognitive communication deficit: Secondary | ICD-10-CM | POA: Diagnosis not present

## 2017-06-19 NOTE — Therapy (Signed)
[facility] [address] [place], [place], [address] Phone: [phone]   Fax:  [phone]  Speech Language Pathology Treatment  Patient Details  Name: Sharon Chan MRN: [id number] Date of Birth: [birth year] No Data Recorded  Encounter Date: 06/19/2017      End of Session - 06/19/17 1724    Visit Number 16   Number of Visits 27   Date for SLP Re-Evaluation 08/04/17   SLP Start Time 0848   SLP Stop Time  0930   SLP Time Calculation (min) 42 min   Activity Tolerance Patient tolerated treatment well      Past Medical History:  Diagnosis Date  . CVA (cerebral vascular accident) ([place])   . Polycystic disease, ovaries   . Seizures ([place])   . Sickle cell trait [facility])     Past Surgical History:  Procedure Laterality Date  . LAPAROSCOPIC CHOLECYSTECTOMY      There were no vitals filed for this visit.      Subjective Assessment - 06/19/17 0853    Subjective "Getting up has been a real struggle."   Currently in Pain? Yes   Pain Score 8    Pain Location Head   Pain Type Chronic pain   Pain Onset More than a month ago               ADULT SLP TREATMENT - 06/19/17 0848      General Information   Behavior/Cognition Alert;Pleasant mood;Cooperative   Patient Positioning Upright in chair   Oral care provided N/A     Treatment Provided   Treatment provided Cognitive-Linquistic     Cognitive-Linquistic Treatment   Treatment focused on Cognition   Skilled Treatment Patient reports she has been feeling "off." SLP facilitated 10 min mod-complex conversation. Pt successfully used compensations for expressive language; she describes forgetting medications, leaving the house without shoes, difficulties with communication. Pt reports her anxiety has improved, though she does admit she is still struggling with anxiety. Provided education re: emotional changes after stroke and recommended pt follow up with her  PCP. SLP provided education re: strategies to reduce time pressure for speaking. Targeted alternating attention via listening/notetaking exercise to simulate pt's notetaking in church. She recorded notes and answered comprehension questions with 95% accuracy on each, self-corrected 2 paraphasic errors in her writing with extended time.     Assessment / Recommendations / Plan   Plan Continue with current plan of care     Progression Toward Goals   Progression toward goals Progressing toward goals          SLP Education - 06/19/17 1730    Education provided Yes   Education Details f/u with PCP re: anxiety, emotional changes, reduce pressure, expressive language compensations   Person(s) Educated Patient   Methods Explanation   Comprehension Verbalized understanding          SLP Short Term Goals - 06/19/17 1724      SLP SHORT TERM GOAL #1   Title Pt will tell four ways to compensate for anomia   Status Achieved     SLP SHORT TERM GOAL #2   Title pt will complete mod complex naming tasks independently with 90% success over three sessions   Status Not Met     SLP SHORT TERM GOAL #3   Title pt will engage functionally in 10 minutes mod complex/complex conversation using compensatory strategies PRN over three sessions   Status Achieved     SLP SHORT TERM  GOAL #4   Title pt will demo WFL alternating attention skills with two or more mod complex cognitive linguistic tasks with 95% success on both   Baseline 8.6.18   Time 1   Period Weeks   Status Achieved     SLP SHORT TERM GOAL #5   Title pt will use expressive language compensations in 10 minutes mod complex conversation over three sessions   Baseline 7.30.18, 06/19/17   Time 1   Status On-going     SLP SHORT TERM GOAL #6   Title pt will perform swallow precautions with modified indpendence over 2 sessions   Baseline 06/08/17,    Time 1   Period Weeks   Status On-going          SLP Long Term Goals - 06/19/17 1726       SLP LONG TERM GOAL #1   Title pt will engage functionally in 20 minutes mod complex -complex conversation outside [place] room, using compensatory strategies PRN over three sessions   Time 4   Period Weeks   Status On-going     SLP LONG TERM GOAL #2   Title pt will talk with SLP outside [place] room for 15 mintues, using compensatory strategies for anomia x2 sessions   Time 4   Period Weeks   Status On-going     SLP LONG TERM GOAL #3   Title pt will read 5-6 sentence paragraph stimuli and answer questions with 90% success and modified independence (compensations, self-correction)   Time 4   Period Weeks   Status On-going     SLP LONG TERM GOAL #4   Title pt will demo swallow precautions with POs in 3 sessions   Baseline 7.26.18   Time 4   Period Weeks   Status On-going          Plan - 06/19/17 1731    Clinical Impression Statement Patient continues to complain of difficulties with verbal expression, particularly in higher-pressure speaking situations. Utilized compensations for expressive language in 10 minutes mod-complex conversation today. Alternating attention with 95% accuracy; self-corrects paraphasic errors in her writing with extended time. Skilled ST is recommended to cont, for improving her verbal expression as close to PLOF as possible, as well as increasing her high level attention ability. MBS recommended and swallow HEP may be warranted following that assessment.    Speech Therapy Frequency 2x / week   Treatment/Interventions Language facilitation;Compensatory techniques;Internal/external aids;SLP instruction and feedback;Multimodal communcation approach;Patient/family education;Functional tasks;Cognitive reorganization;Aspiration precaution training;Pharyngeal strengthening exercises;Diet toleration management by SLP;Cueing hierarchy   Potential to Achieve Goals Good   Potential Considerations Severity of impairments   SLP Home Exercise Plan alternating recipes task    Consulted and Agree with Plan of Care Patient      Patient will benefit from skilled therapeutic intervention in order to improve the following deficits and impairments:   Cognitive communication deficit    Problem List Patient Active Problem List   Diagnosis Date Noted  . Hemiparesis affecting right side as late effect of cerebrovascular accident ([place]) 03/23/2017  . Lacunar infarct, acute ([place]) 03/23/2017  . Visual disturbance as complication of stroke 86/76/7209  . Adjustment disorder with anxious mood   . 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 Sickle cell trait ([place])   . Hyperlipidemia   . Thyroid nodule   . Changes in vision   . Acute ischemic stroke ([place])   . Dysphagia, post-stroke   . Benign essential HTN   .  Polycystic kidney disease   . Prediabetes   . Acute intractable headache   . Leukocytosis   . Stroke (cerebrum) ([place]) 02/25/2017   Deneise Lever, [place], [place] 06/19/2017, 5:33 PM  [facility] [address] [place], [place], [address] Phone: [phone]   Fax:  [phone]   Name: Sharon Chan MRN: [id number] Date of Birth: 01-06-1976

## 2017-06-20 ENCOUNTER — Other Ambulatory Visit: Payer: Self-pay | Admitting: Family Medicine

## 2017-06-22 ENCOUNTER — Ambulatory Visit: Payer: Medicare Other | Admitting: Speech Pathology

## 2017-06-22 DIAGNOSIS — R41841 Cognitive communication deficit: Secondary | ICD-10-CM

## 2017-06-22 DIAGNOSIS — R1310 Dysphagia, unspecified: Secondary | ICD-10-CM | POA: Diagnosis not present

## 2017-06-22 DIAGNOSIS — R4701 Aphasia: Secondary | ICD-10-CM | POA: Diagnosis not present

## 2017-06-22 NOTE — Therapy (Signed)
[facility] [address] [place], [place], [address] Phone: [phone]   Fax:  [phone]  Speech Language Pathology Treatment  Patient Details  Name: Sharon Chan MRN: [id number] Date of Birth: 28-Jun-1976 No Data Recorded  Encounter Date: 06/22/2017      End of Session - 06/22/17 1028    Visit Number 17   Number of Visits 27   Date for SLP Re-Evaluation 08/04/17   SLP Start Time 20   SLP Stop Time  1100   SLP Time Calculation (min) 40 min   Activity Tolerance Patient tolerated treatment well      Past Medical History:  Diagnosis Date  . CVA (cerebral vascular accident) ([place])   . Polycystic disease, ovaries   . Seizures ([place])   . Sickle cell trait [facility])     Past Surgical History:  Procedure Laterality Date  . LAPAROSCOPIC CHOLECYSTECTOMY      There were no vitals filed for this visit.      Subjective Assessment - 06/22/17 1021    Subjective "My nerves are a little frazzled this morning."   Currently in Pain? Yes   Pain Score 8    Pain Location Head   Pain Type Chronic pain               ADULT SLP TREATMENT - 06/22/17 1022      General Information   Behavior/Cognition Alert;Pleasant mood;Cooperative   Patient Positioning Upright in chair   Oral care provided N/A     Treatment Provided   Treatment provided Cognitive-Linquistic;Dysphagia     Dysphagia Treatment   Temperature Spikes Noted No   Respiratory Status Room air   Oral Cavity - Dentition Adequate natural dentition   Treatment Methods Skilled observation;Compensation strategy training;Patient/caregiver education   Patient observed directly with PO's Yes   Type of PO's observed Regular;Thin liquids   Feeding Able to feed self   Liquids provided via [place]   Pharyngeal Phase Signs & Symptoms Delayed cough   Type of cueing Verbal;Visual   Amount of cueing Modified independent     Cognitive-Linquistic Treatment   Treatment focused on Cognition   Skilled Treatment SLP facilitated 10 min moderately complex conversation; pt alternated attention between conversation and simple recipe math task with 80% accuracy and WFL use of compensations for expressive language. SLP facilitated recall of swallowing precautions. Pt demo'd precautions independently, including right head turn.      Assessment / Recommendations / Plan   Plan Continue with current plan of care     Dysphagia Recommendations   Diet recommendations Regular;Thin liquid   Liquids provided via [place]raw   Medication Administration Whole meds with puree   Supervision Patient able to self feed   Compensations Slow rate;Small sips/bites;Follow solids with liquid   Postural Changes and/or Swallow Maneuvers Head turn right during swallow     Progression Toward Goals   Progression toward goals Progressing toward goals            SLP Short Term Goals - 06/22/17 1025      SLP SHORT TERM GOAL #1   Title Pt will tell four ways to compensate for anomia   Status Achieved     SLP SHORT TERM GOAL #2   Title pt will complete mod complex naming tasks independently with 90% success over three sessions   Status Not Met     SLP SHORT TERM GOAL #3   Title pt will engage functionally in 10 minutes mod  complex/complex conversation using compensatory strategies PRN over three sessions   Status Achieved     SLP SHORT TERM GOAL #4   Title pt will demo WFL alternating attention skills with two or more mod complex cognitive linguistic tasks with 95% success on both   Status Achieved     SLP SHORT TERM GOAL #5   Title pt will use expressive language compensations in 10 minutes mod complex conversation over three sessions   Baseline 7.30.18, 06/19/17, 06/22/17   Time 1   Period Weeks   Status Achieved     SLP SHORT TERM GOAL #6   Baseline 06/08/17, 06/22/17   Time 1   Period Weeks   Status Achieved          SLP Long Term Goals - 06/22/17 1027      SLP  LONG TERM GOAL #1   Title pt will engage functionally in 20 minutes mod complex -complex conversation outside [place] room, using compensatory strategies PRN over three sessions   Time 4   Period Weeks   Status On-going     SLP LONG TERM GOAL #2   Title pt will talk with SLP outside [place] room for 15 mintues, using compensatory strategies for anomia x2 sessions   Time 4   Period Weeks   Status On-going     SLP LONG TERM GOAL #3   Title pt will read 5-6 sentence paragraph stimuli and answer questions with 90% success and modified independence (compensations, self-correction)   Time 4   Period Weeks   Status On-going     SLP LONG TERM GOAL #4   Title pt will demo swallow precautions with POs in 3 sessions   Baseline 7.26.18   Time 4   Period Weeks   Status On-going          Plan - 06/22/17 1151    Clinical Impression Statement Patient utilized compensations for expressive language in 10 minutes mod-complex conversation today. Alternating attention with 80% accuracy; initially requires min A for error awareness; self-corrects all errors in subsequent exercise. Demos swallow precautions independently however she continues to present with intermittent delayed coughing, solids>liquids. Skilled ST is recommended to cont, for improving her verbal expression as close to PLOF as possible, as well as increasing her high level attention ability. MBS recommended and swallow HEP may be warranted following that assessment.    Speech Therapy Frequency 2x / week   Treatment/Interventions Language facilitation;Compensatory techniques;Internal/external aids;SLP instruction and feedback;Multimodal communcation approach;Patient/family education;Functional tasks;Cognitive reorganization;Aspiration precaution training;Pharyngeal strengthening exercises;Diet toleration management by SLP;Cueing hierarchy   Potential to Achieve Goals Good   Potential Considerations Severity of impairments   SLP Home Exercise Plan  notetaking tasks   Consulted and Agree with Plan of Care Patient      Patient will benefit from skilled therapeutic intervention in order to improve the following deficits and impairments:   Cognitive communication deficit  Dysphagia, unspecified type    Problem List Patient Active Problem List   Diagnosis Date Noted  . Hemiparesis affecting right side as late effect of cerebrovascular accident ([place]) 03/23/2017  . Lacunar infarct, acute ([place]) 03/23/2017  . Visual disturbance as complication of stroke 09/32/3557  . Adjustment disorder with anxious mood   . 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 Sickle cell trait ([place])   . Hyperlipidemia   . Thyroid nodule   . Changes in vision   . Acute ischemic stroke ([place])   . Dysphagia, post-stroke   .  Benign essential HTN   . Polycystic kidney disease   . Prediabetes   . Acute intractable headache   . Leukocytosis   . Stroke (cerebrum) ([place]) 02/25/2017   Deneise Lever, [place], [place] 06/22/2017, 11:56 AM  [facility] [address] [place], [place], [address] Phone: [phone]   Fax:  [phone]   Name: Sharon Chan MRN: [id number] Date of Birth: [birth year]

## 2017-06-26 ENCOUNTER — Ambulatory Visit: Payer: Medicare Other | Admitting: Speech Pathology

## 2017-06-26 DIAGNOSIS — R41841 Cognitive communication deficit: Secondary | ICD-10-CM | POA: Diagnosis not present

## 2017-06-26 DIAGNOSIS — R1310 Dysphagia, unspecified: Secondary | ICD-10-CM | POA: Diagnosis not present

## 2017-06-26 DIAGNOSIS — R4701 Aphasia: Secondary | ICD-10-CM | POA: Diagnosis not present

## 2017-06-26 NOTE — Therapy (Signed)
[facility] [address] [place], [place], [address] Phone: [phone]   Fax:  [phone]  Speech Language Pathology Treatment  Patient Details  Name: Sharon Chan MRN: [id number] Date of Birth: May 03, 1976 No Data Recorded  Encounter Date: 06/26/2017      End of Session - 06/26/17 1742    Visit Number 18   Number of Visits 27   Date for SLP Re-Evaluation 08/04/17   SLP Start Time 1017   SLP Stop Time  1100   SLP Time Calculation (min) 43 min      Past Medical History:  Diagnosis Date  . CVA (cerebral vascular accident) ([place])   . Polycystic disease, ovaries   . Seizures ([place])   . Sickle cell trait [facility])     Past Surgical History:  Procedure Laterality Date  . LAPAROSCOPIC CHOLECYSTECTOMY      There were no vitals filed for this visit.      Subjective Assessment - 06/26/17 1019    Subjective "Uneventful... other than me falling in my house a million times."   Currently in Pain? Yes   Pain Score 8    Pain Location Head   Pain Type Chronic pain   Pain Onset More than a month ago               ADULT SLP TREATMENT - 06/26/17 1041      General Information   Behavior/Cognition Alert;Pleasant mood;Cooperative   Patient Positioning Upright in chair   Oral care provided Yes     Treatment Provided   Treatment provided Cognitive-Linquistic     Cognitive-Linquistic Treatment   Treatment focused on Cognition   Skilled Treatment Pt reports she has not yet followed up with her PCP re: anxiety, however plans to do so this week. She reports difficulty focusing over the weekend, including dressing "out of order," brushing her teeth without toothpaste, singing the wrong song in choir practice. States she fell "10-12 times" at home because she was "trying to do too much." SLP educated re: need to use strategies, limit multitasking, and take mental breaks throughout the day. SLP facilitated  anticipatory awareness by having pt complete a "priority list" and identify areas where she is "losing focus" and state adjustments she can make to improve her function. She reports starting a book multiple times and forgetting what she reads. Pt instructed to begin taking notes while reading; bring book and notes to next session.     Assessment / Recommendations / Plan   Plan Continue with current plan of care     Progression Toward Goals   Progression toward goals Progressing toward goals          SLP Education - 06/26/17 1740    Education provided Yes   Education Details avoid multitasking, if struggling with an activity try modifying it first before avoiding it entirely   Person(s) Educated Patient   Methods Explanation   Comprehension Need further instruction;Verbalized understanding          SLP Short Term Goals - 06/26/17 1041      SLP SHORT TERM GOAL #1   Title Pt will tell four ways to compensate for anomia   Status Achieved     SLP SHORT TERM GOAL #2   Title pt will complete mod complex naming tasks independently with 90% success over three sessions   Status Not Met     SLP SHORT TERM GOAL #3   Title pt will engage functionally  in 10 minutes mod complex/complex conversation using compensatory strategies PRN over three sessions   Status Achieved     SLP SHORT TERM GOAL #4   Title pt will demo WFL alternating attention skills with two or more mod complex cognitive linguistic tasks with 95% success on both   Status Achieved     SLP SHORT TERM GOAL #5   Title pt will use expressive language compensations in 10 minutes mod complex conversation over three sessions   Status Achieved     SLP [place] #6   Title pt will perform swallow precautions with modified indpendence over 2 sessions   Status Achieved          SLP Long Term Goals - 06/26/17 1721      SLP LONG TERM GOAL #5   Title --          Plan - 06/26/17 1742    Clinical Impression  Statement Patient utilized compensations for expressive language in 20 minutes mod-complex conversation today in a quiet environment. Reports functional deficits in attention at home; SLP educated re: strategies to improve her attention including avoiding multi-tasking. Long term goal added for functional use of attention strategies. Skilled ST is recommended to cont, for improving her verbal expression as close to PLOF as possible, as well as increasing her high level attention ability. MBS recommended and swallow HEP may be warranted following that assessment.    Speech Therapy Frequency 2x / week   Treatment/Interventions Language facilitation;Compensatory techniques;Internal/external aids;SLP instruction and feedback;Multimodal communcation approach;Patient/family education;Functional tasks;Cognitive reorganization;Aspiration precaution training;Pharyngeal strengthening exercises;Diet toleration management by SLP;Cueing hierarchy   Potential to Achieve Goals Good   Potential Considerations Severity of impairments   SLP Home Exercise Plan notetaking tasks   Consulted and Agree with Plan of Care Patient      Patient will benefit from skilled therapeutic intervention in order to improve the following deficits and impairments:   Cognitive communication deficit  Aphasia    Problem List Patient Active Problem List   Diagnosis Date Noted  . Hemiparesis affecting right side as late effect of cerebrovascular accident ([place]) 03/23/2017  . Lacunar infarct, acute ([name]) 03/23/2017  . Visual disturbance as complication of stroke 85/46/2703  . Adjustment disorder with anxious mood   . 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 Sickle cell trait ([place])   . Hyperlipidemia   . Thyroid nodule   . Changes in vision   . Acute ischemic stroke ([place])   . Dysphagia, post-stroke   . Benign essential HTN   . Polycystic kidney disease   . Prediabetes   . Acute intractable  headache   . Leukocytosis   . Stroke (cerebrum) ([place]) 02/25/2017   Deneise Lever, [place], [place] 06/26/2017, 5:44 PM  [facility] [address] [place], [place], [address] Phone: [phone]   Fax:  [phone]   Name: Sharon Chan MRN: [id number] Date of Birth: [birth year]

## 2017-06-28 ENCOUNTER — Ambulatory Visit: Payer: Medicare Other

## 2017-06-28 DIAGNOSIS — R1310 Dysphagia, unspecified: Secondary | ICD-10-CM

## 2017-06-28 DIAGNOSIS — R41841 Cognitive communication deficit: Secondary | ICD-10-CM

## 2017-06-28 DIAGNOSIS — R4701 Aphasia: Secondary | ICD-10-CM | POA: Diagnosis not present

## 2017-06-28 NOTE — Therapy (Signed)
[facility] [address] [place], [place], [address] Phone: [phone]   Fax:  [phone]  Speech Language Pathology Treatment  Patient Details  Name: Sharon Chan MRN: [id number] Date of Birth: 10-Jul-1976 No Data Recorded  Encounter Date: 06/28/2017      End of Session - 06/28/17 1316    Visit Number 19   Number of Visits 27   Date for SLP Re-Evaluation 08/04/17   SLP Start Time 15   SLP Stop Time  1145   SLP Time Calculation (min) 43 min   Activity Tolerance Patient tolerated treatment well      Past Medical History:  Diagnosis Date  . CVA (cerebral vascular accident) ([place])   . Polycystic disease, ovaries   . Seizures ([place])   . Sickle cell trait [facility])     Past Surgical History:  Procedure Laterality Date  . LAPAROSCOPIC CHOLECYSTECTOMY      There were no vitals filed for this visit.             ADULT SLP TREATMENT - 06/28/17 1227      General Information   Behavior/Cognition Alert;Pleasant mood;Cooperative     Treatment Provided   Treatment provided Cognitive-Linquistic     Cognitive-Linquistic Treatment   Treatment focused on Aphasia   Skilled Treatment Pt emotional today throughout session (almost near tears) about changes post CVA.Told SLP thta the last two weeks it seems she has gotten worse with reading and auditory comprehension. SLP and pt discussed about modifications pt could make to reading as well as with spoken situaitons like sermons to augment/compensate for deficits.      Assessment / Recommendations / Plan   Plan Continue with current plan of care     Progression Toward Goals   Progression toward goals Progressing toward goals          SLP Education - 06/28/17 1316    Education provided Yes   Education Details auditory and reading comprehension compensations   Person(s) Educated Patient   Methods Explanation;Demonstration   Comprehension Verbalized  understanding          SLP Short Term Goals - 06/26/17 1041      SLP SHORT TERM GOAL #1   Title Pt will tell four ways to compensate for anomia   Status Achieved     SLP SHORT TERM GOAL #2   Title pt will complete mod complex naming tasks independently with 90% success over three sessions   Status Not Met     SLP SHORT TERM GOAL #3   Title pt will engage functionally in 10 minutes mod complex/complex conversation using compensatory strategies PRN over three sessions   Status Achieved     SLP [place] #4   Title pt will demo WFL alternating attention skills with two or more mod complex cognitive linguistic tasks with 95% success on both   Status Achieved     SLP SHORT TERM GOAL #5   Title pt will use expressive language compensations in 10 minutes mod complex conversation over three sessions   Status Achieved     SLP SHORT TERM GOAL #6   Title pt will perform swallow precautions with modified indpendence over 2 sessions   Status Achieved          SLP Long Term Goals - 06/28/17 1318      SLP LONG TERM GOAL #1   Title pt will engage functionally in 20 minutes mod complex -complex conversation outside [place]  room, using compensatory strategies PRN over three sessions   Time 3   Period Weeks   Status On-going     SLP LONG TERM GOAL #2   Title pt will talk with SLP outside [place] room for 15 mintues, using compensatory strategies for anomia x2 sessions   Time 3   Period Weeks   Status On-going     SLP LONG TERM GOAL #3   Title pt will read 5-6 sentence paragraph stimuli and answer questions with 90% success and modified independence (compensations, self-correction)   Time 3   Period Weeks   Status On-going     SLP LONG TERM GOAL #4   Title pt will demo swallow precautions with POs in 3 sessions   Baseline 7.26.18   Time 3   Period Weeks   Status On-going     SLP LONG TERM GOAL #5   Title Pt will report daily functional and effective use of attention strategies  for participating in daily activities and appointments.   Time 3   Period Weeks   Status New          Plan - 06/28/17 1317    Clinical Impression Statement SLP and pt discussed today compenastions for auditory and reading comprehension as pt states this appeares to have worsened over te last two weeks. See "skilled intervention" for more details. Skilled ST is recommended to cont, for improving her verbal expression as close to PLOF as possible, as well as increasing her high level attention ability. MBS recommended and swallow HEP may be warranted following that assessment.    Speech Therapy Frequency 2x / week   Treatment/Interventions Language facilitation;Compensatory techniques;Internal/external aids;SLP instruction and feedback;Multimodal communcation approach;Patient/family education;Functional tasks;Cognitive reorganization;Aspiration precaution training;Pharyngeal strengthening exercises;Diet toleration management by SLP;Cueing hierarchy   Potential to Achieve Goals Good   Potential Considerations Severity of impairments   SLP Home Exercise Plan notetaking tasks   Consulted and Agree with Plan of Care Patient      Patient will benefit from skilled therapeutic intervention in order to improve the following deficits and impairments:   Aphasia  Cognitive communication deficit  Dysphagia, unspecified type    Problem List Patient Active Problem List   Diagnosis Date Noted  . Hemiparesis affecting right side as late effect of cerebrovascular accident ([place]) 03/23/2017  . Lacunar infarct, acute ([place]) 03/23/2017  . Visual disturbance as complication of stroke 64/33/2951  . Adjustment disorder with anxious mood   . 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 Sickle cell trait ([place])   . Hyperlipidemia   . Thyroid nodule   . Changes in vision   . Acute ischemic stroke ([place])   . Dysphagia, post-stroke   . Benign essential HTN   . Polycystic  kidney disease   . Prediabetes   . Acute intractable headache   . Leukocytosis   . Stroke (cerebrum) ([place]) 02/25/2017    Sharon Chan ,MS, [place]  06/28/2017, 1:19 PM  [facility] [address] [place], [place], [address] Phone: [phone]   Fax:  [phone]   Name: Sharon Chan MRN: [id number] Date of Birth: 27-Feb-1976

## 2017-06-30 IMAGING — US US SOFT TISSUE HEAD/NECK
1 series · 13 of 25 positions shown · non-contrast
Comparison: None.

CLINICAL DATA: Thyroid nodule by CTA

EXAM:
THYROID ULTRASOUND
TECHNIQUE: Ultrasound examination of the thyroid gland and adjacent soft
tissues was performed.

[Series 1: us soft tissue head/neck · 0.07mm/px · 13 of 46 slices shown]
[im 1/46]
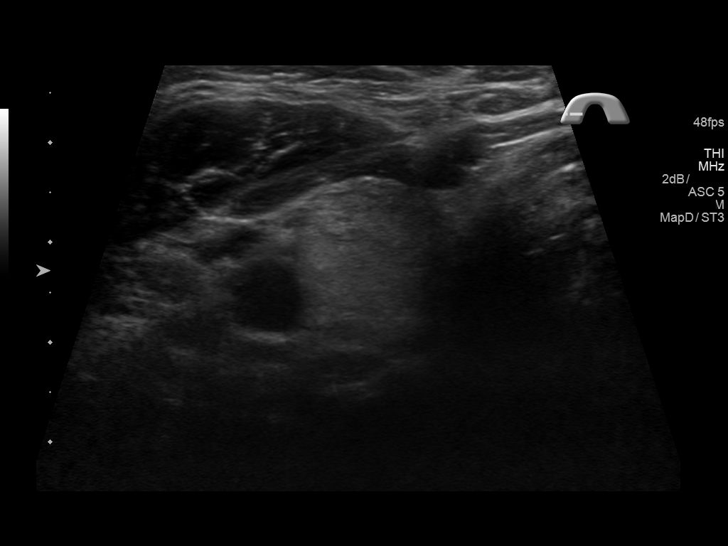
[im 4/46]
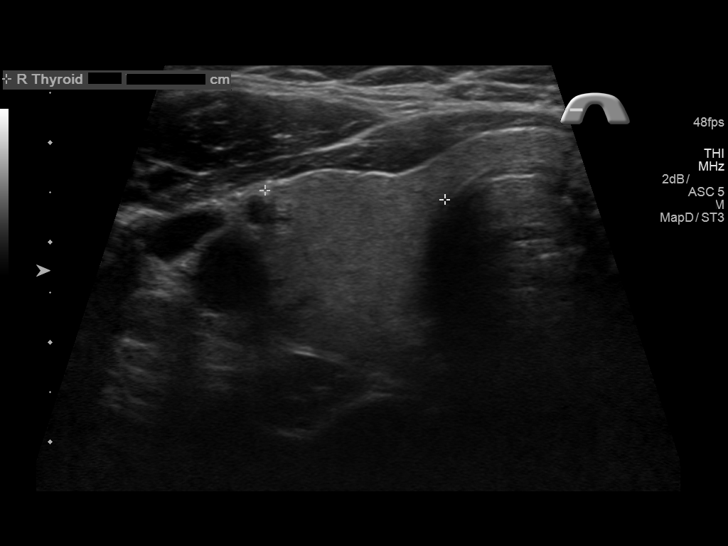
[im 8/46]
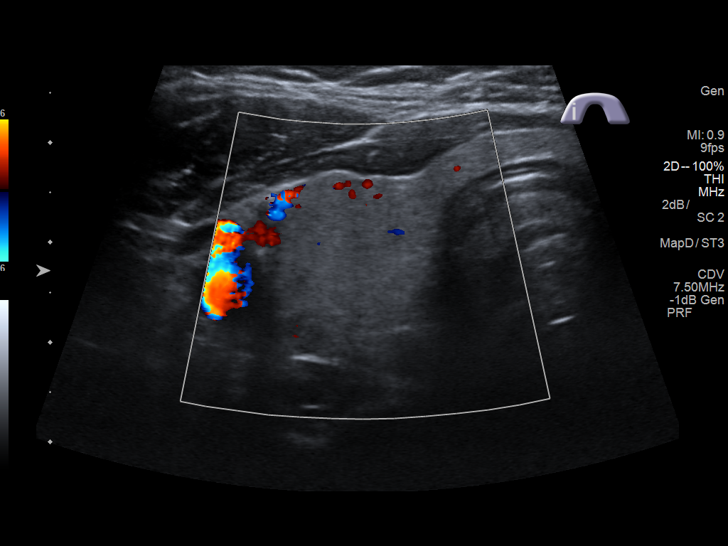
[im 12/46]
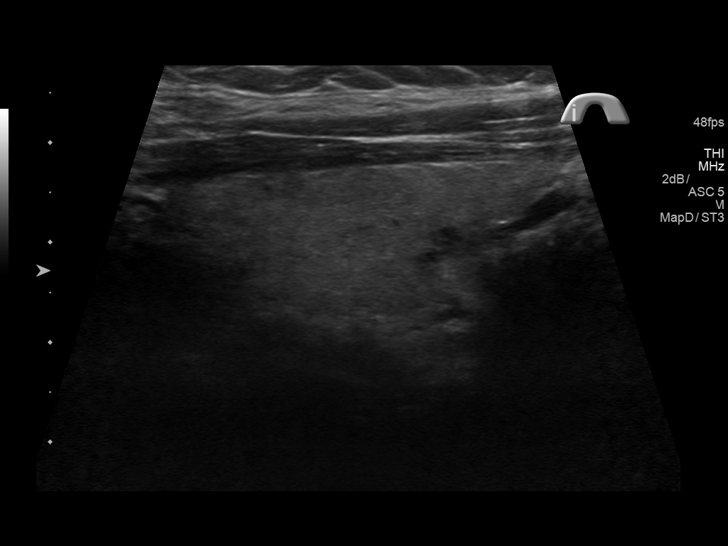
[im 16/46]
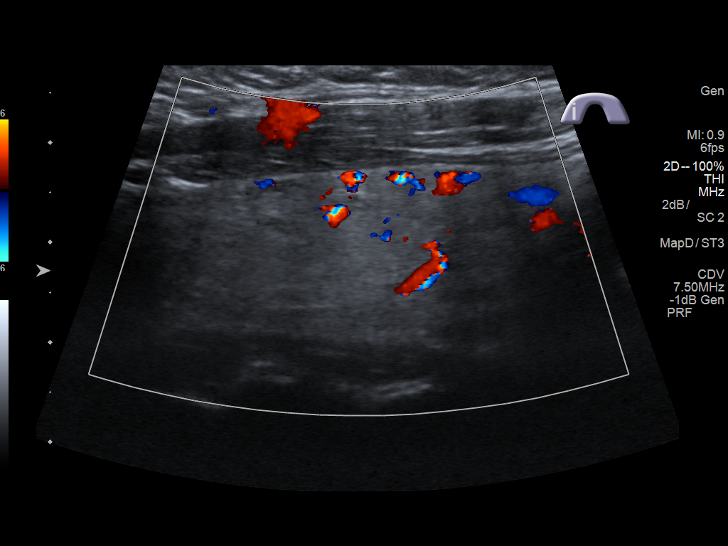
[im 19/46]
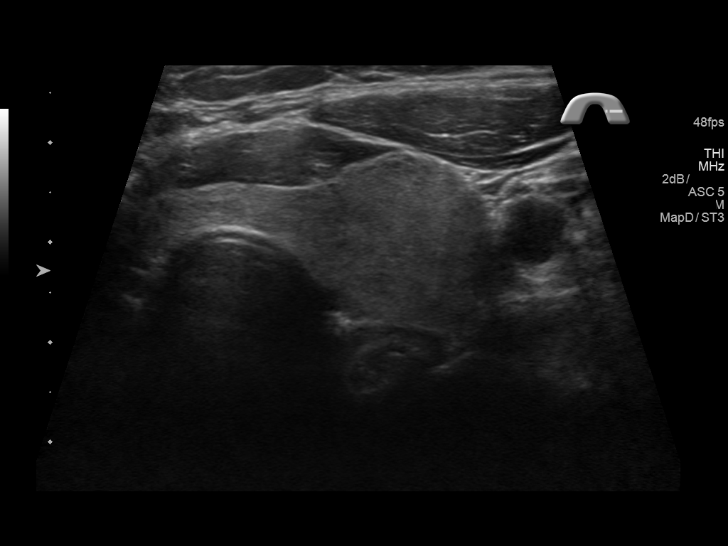
[im 23/46]
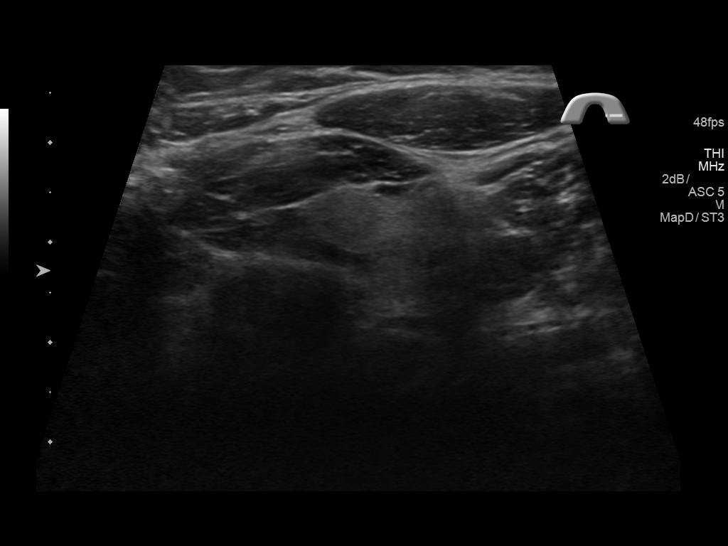
[im 27/46]
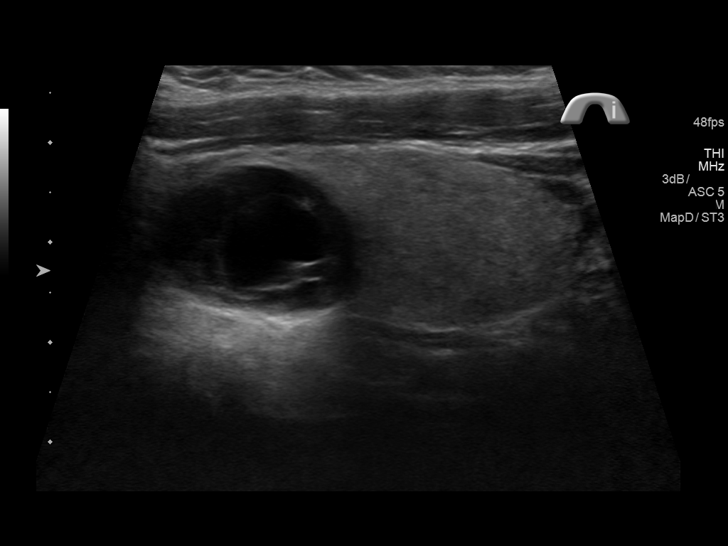
[im 31/46]
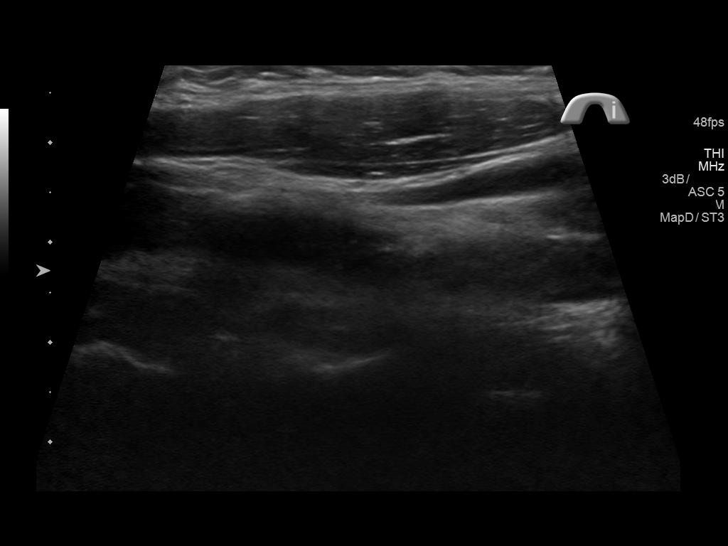
[im 34/46]
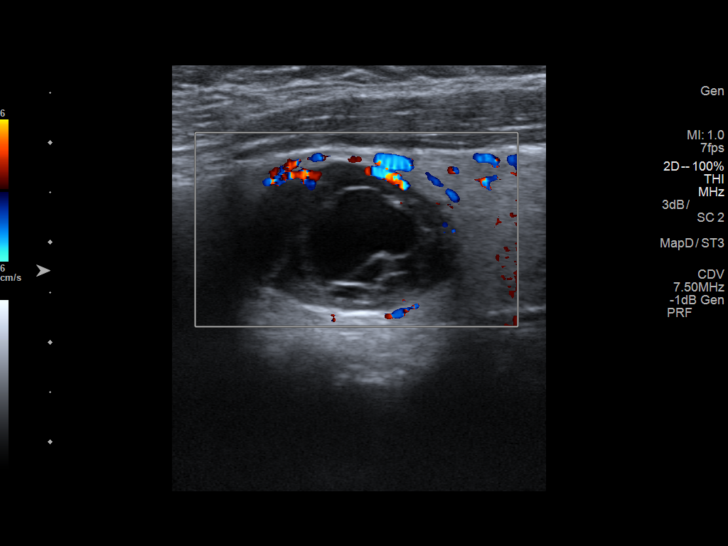
[im 38/46]
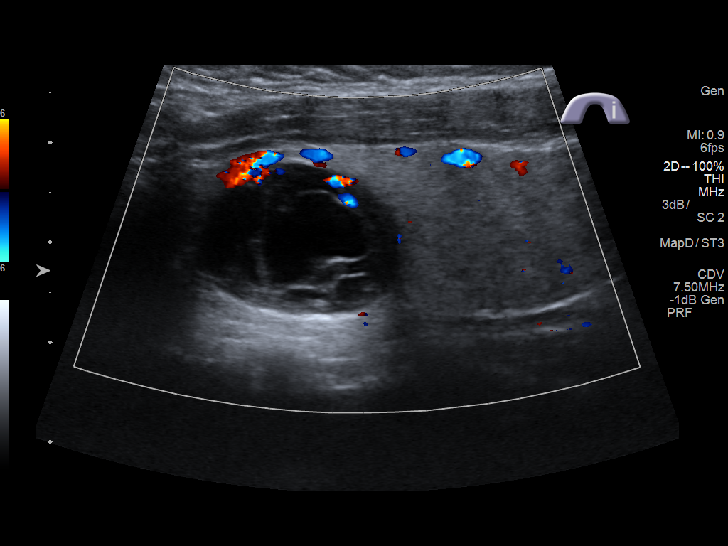
[im 42/46]
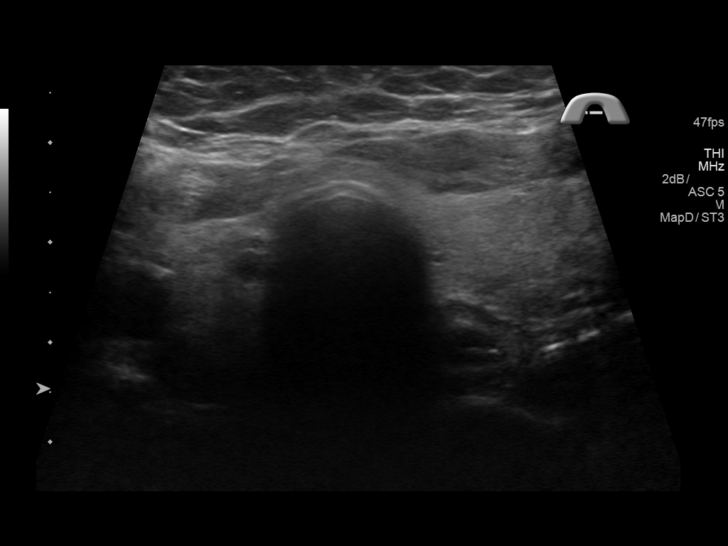
[im 46/46]
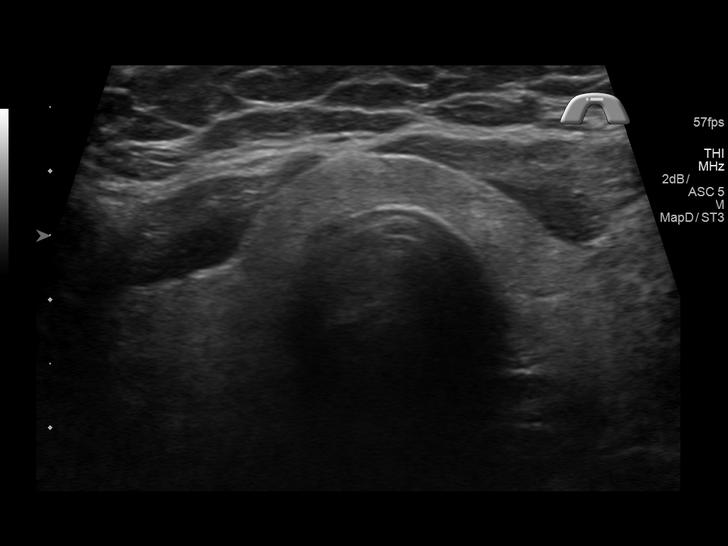

[13 of 25 positions shown; findings below may reference images not displayed]

FINDINGS: Parenchymal Echotexture: Mildly heterogenous

Isthmus: 5 mm

Right lobe: 4.4 x 2.1 x 1.8 cm

Left lobe: 4.5 x 1.7 x 2.1 cm

_________________________________________________________

Estimated total number of nodules >/= 1 cm: 1

Number of spongiform nodules >/=  2 cm not described below (TR1): 0

Number of mixed cystic and solid nodules >/= 1.5 cm not described
below (TR2): 0

_________________________________________________________

Nodule # 1:

Location: Left; Superior

Maximum size: 2.2 cm; Other 2 dimensions: 1.5 x 2.0 cm

Composition: mixed cystic and solid (1)

Echogenicity: hypoechoic (2)

Shape: not taller-than-wide (0)

Margins: smooth (0)

Echogenic foci: none (0)

ACR TI-RADS total points: 3.

ACR TI-RADS risk category: TR3 (3 points).

ACR TI-RADS recommendations:

*Given size (>/= 1.5 - 2.4 cm) and appearance, a follow-up
ultrasound in 1 year should be considered based on TI-RADS criteria.

_________________________________________________________
IMPRESSION: 2.2 cm left superior mixed cystic solid hypoechoic TR 3 nodule. This
correlates with the CTA finding. This nodule meets criteria for
follow-up in 1 year.

No other significant thyroid abnormality.

The above is in keeping with the ACR TI-RADS recommendations - [HOSPITAL] 0129;[DATE].

## 2017-07-03 ENCOUNTER — Ambulatory Visit: Payer: Medicare Other | Admitting: Speech Pathology

## 2017-07-03 DIAGNOSIS — R4701 Aphasia: Secondary | ICD-10-CM | POA: Diagnosis not present

## 2017-07-03 DIAGNOSIS — R1310 Dysphagia, unspecified: Secondary | ICD-10-CM | POA: Diagnosis not present

## 2017-07-03 DIAGNOSIS — R41841 Cognitive communication deficit: Secondary | ICD-10-CM | POA: Diagnosis not present

## 2017-07-03 NOTE — Therapy (Signed)
[facility] [address] [place], [place], [address] Phone: [phone]   Fax:  [phone]  Speech Language Pathology Treatment  Patient Details  Name: Sharon Chan MRN: [id number] Date of Birth: 10/23/1976 No Data Recorded  Encounter Date: 07/03/2017      End of Session - 07/03/17 1010    Visit Number 20   Number of Visits 27   Date for SLP Re-Evaluation 08/04/17   SLP Start Time 0847   SLP Stop Time  0932   SLP Time Calculation (min) 45 min   Activity Tolerance Patient tolerated treatment well      Past Medical History:  Diagnosis Date  . CVA (cerebral vascular accident) ([place])   . Polycystic disease, ovaries   . Seizures ([place])   . Sickle cell trait [facility])     Past Surgical History:  Procedure Laterality Date  . LAPAROSCOPIC CHOLECYSTECTOMY      There were no vitals filed for this visit.      Subjective Assessment - 07/03/17 0852    Subjective "I pulled all my clothes out and thought, why did I start this?"   Currently in Pain? Yes   Pain Score 10-Worst pain ever   Pain Location Head   Pain Onset More than a month ago   Aggravating Factors  change in weather?   Pain Relieving Factors caffeine               ADULT SLP TREATMENT - 07/03/17 0854      General Information   Behavior/Cognition Alert;Pleasant mood;Cooperative   Patient Positioning Upright in chair     Treatment Provided   Treatment provided Cognitive-Linquistic     Cognitive-Linquistic Treatment   Treatment focused on Aphasia   Skilled Treatment SLP targeted aphasia today. Pt described communication breakdown when she was attempting to ask for assistance at the grocery store and left without purchasing paper towels. SLP provided min-mod cues for pt to utilize description, gesture, and drawing to demo how pt could have communicated her need. Pt again emotional during today's session, occasionally tearful. Provided  education re: local support group for aphasia, gave pt wallet card she can share with others re: aphasia. SLP worked with pt on reading 5-6 sentence paragraphs today. Pt read aloud, requiring rare min A for correcting paraphasic errors by utilizing context cues. Answered 1/3 questions re: reading accurately. SLP demo'd taking notes while reading to improve paragraph retention. Home task assigned.     Assessment / Recommendations / Plan   Plan Continue with current plan of care     Progression Toward Goals   Progression toward goals Progressing toward goals          SLP Education - 07/03/17 1010    Education provided Yes   Education Details support group for aphasia, reading compensations   Person(s) Educated Patient   Methods Explanation;Demonstration;Verbal cues   Comprehension Verbalized understanding          SLP Short Term Goals - 07/03/17 1014      SLP SHORT TERM GOAL #1   Title Pt will tell four ways to compensate for anomia   Status Achieved     SLP SHORT TERM GOAL #2   Title pt will complete mod complex naming tasks independently with 90% success over three sessions   Status Not Met     SLP SHORT TERM GOAL #3   Title pt will engage functionally in 10 minutes mod complex/complex conversation using compensatory strategies PRN  over three sessions   Status Achieved     SLP SHORT TERM GOAL #4   Title pt will demo WFL alternating attention skills with two or more mod complex cognitive linguistic tasks with 95% success on both   Status Achieved     SLP SHORT TERM GOAL #5   Title pt will use expressive language compensations in 10 minutes mod complex conversation over three sessions   Status Achieved     SLP [place] #6   Title pt will perform swallow precautions with modified indpendence over 2 sessions   Status Achieved          SLP Long Term Goals - 07/03/17 9563      SLP LONG TERM GOAL #1   Title pt will engage functionally in 20 minutes mod complex  -complex conversation outside [place] room, using compensatory strategies PRN over three sessions   Time 2   Period Weeks   Status On-going     SLP LONG TERM GOAL #2   Title pt will talk with SLP outside [place] room for 15 mintues, using compensatory strategies for anomia x2 sessions   Time 2   Period Weeks   Status On-going     SLP LONG TERM GOAL #3   Title pt will read 5-6 sentence paragraph stimuli and answer questions with 90% success and modified independence (compensations, self-correction)   Time 2   Period Weeks   Status On-going     SLP LONG TERM GOAL #4   Title pt will demo swallow precautions with POs in 3 sessions   Time 2   Period Weeks   Status On-going     SLP LONG TERM GOAL #5   Title Pt will report daily functional and effective use of attention strategies for participating in daily activities and appointments.   Time 2   Period Weeks   Status On-going          Plan - 07/03/17 1011    Clinical Impression Statement SLP continued training in reading comprehension, use of word-finding compensations in verbal. While pt states she feels verbal expression has declined, her fluency in comprehension has improved overall during therapy sessions. She continues to have difficulty with expression in higher-pressure situations. Skilled ST is recommended to cont, for improving her verbal expression as close to PLOF as possible, as well as increasing her high level attention ability. MBS recommended and swallow HEP may be warranted following that assessment.    Speech Therapy Frequency 2x / week   Treatment/Interventions Language facilitation;Compensatory techniques;Internal/external aids;SLP instruction and feedback;Multimodal communcation approach;Patient/family education;Functional tasks;Cognitive reorganization;Aspiration precaution training;Pharyngeal strengthening exercises;Diet toleration management by SLP;Cueing hierarchy   Potential to Achieve Goals Good   Potential  Considerations Severity of impairments   SLP Home Exercise Plan notetaking tasks   Consulted and Agree with Plan of Care Patient      Patient will benefit from skilled therapeutic intervention in order to improve the following deficits and impairments:   Aphasia  Cognitive communication deficit   Speech Therapy Progress Note  Dates of Reporting Period: 05/18/17 to 07/03/17  Subjective Statement: Patient has been seen for 10 visits this reporting period  Objective Measurements: See Skilled interventions above  Goal Update: Progressing toward goals  Plan: Continue plan as written  Reason Skilled Services are Required:  for improving her verbal expression as close to PLOF as possible, as well as increasing her high level attention ability. MBS recommended and swallow HEP may be warranted following that assessment.  Problem List Patient Active Problem List   Diagnosis Date Noted  . Hemiparesis affecting right side as late effect of cerebrovascular accident ([place]) 03/23/2017  . Lacunar infarct, acute ([place]) 03/23/2017  . Visual disturbance as complication of stroke 67/67/2094  . Adjustment disorder with anxious mood   . 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 Sickle cell trait ([place])   . Hyperlipidemia   . Thyroid nodule   . Changes in vision   . Acute ischemic stroke ([place])   . Dysphagia, post-stroke   . Benign essential HTN   . Polycystic kidney disease   . Prediabetes   . Acute intractable headache   . Leukocytosis   . Stroke (cerebrum) ([place]) 02/25/2017   Deneise Lever, [place], [place] 07/03/2017, 10:15 AM  [facility] [address] [place], [place], [address] Phone: [phone]   Fax:  [phone]   Name: PARMINDER TRAPANI MRN: [id number] Date of Birth: September 02, 1976

## 2017-07-04 ENCOUNTER — Ambulatory Visit (HOSPITAL_BASED_OUTPATIENT_CLINIC_OR_DEPARTMENT_OTHER): Payer: Medicare Other | Admitting: Physical Medicine & Rehabilitation

## 2017-07-04 ENCOUNTER — Encounter: Payer: Self-pay | Admitting: Physical Medicine & Rehabilitation

## 2017-07-04 ENCOUNTER — Encounter: Payer: Medicare Other | Attending: Physical Medicine & Rehabilitation

## 2017-07-04 VITALS — BP 151/103 | HR 77 | Resp 14

## 2017-07-04 DIAGNOSIS — I69328 Other speech and language deficits following cerebral infarction: Secondary | ICD-10-CM | POA: Insufficient documentation

## 2017-07-04 DIAGNOSIS — I69318 Other symptoms and signs involving cognitive functions following cerebral infarction: Secondary | ICD-10-CM | POA: Diagnosis not present

## 2017-07-04 DIAGNOSIS — I69320 Aphasia following cerebral infarction: Secondary | ICD-10-CM | POA: Insufficient documentation

## 2017-07-04 DIAGNOSIS — I69391 Dysphagia following cerebral infarction: Secondary | ICD-10-CM

## 2017-07-04 DIAGNOSIS — I639 Cerebral infarction, unspecified: Secondary | ICD-10-CM

## 2017-07-04 DIAGNOSIS — I63411 Cerebral infarction due to embolism of right middle cerebral artery: Secondary | ICD-10-CM

## 2017-07-04 DIAGNOSIS — D571 Sickle-cell disease without crisis: Secondary | ICD-10-CM | POA: Diagnosis not present

## 2017-07-04 DIAGNOSIS — H539 Unspecified visual disturbance: Secondary | ICD-10-CM

## 2017-07-04 DIAGNOSIS — R1310 Dysphagia, unspecified: Secondary | ICD-10-CM | POA: Diagnosis not present

## 2017-07-04 DIAGNOSIS — I69398 Other sequelae of cerebral infarction: Secondary | ICD-10-CM | POA: Insufficient documentation

## 2017-07-04 DIAGNOSIS — R569 Unspecified convulsions: Secondary | ICD-10-CM | POA: Insufficient documentation

## 2017-07-04 DIAGNOSIS — F4322 Adjustment disorder with anxiety: Secondary | ICD-10-CM

## 2017-07-04 DIAGNOSIS — I69351 Hemiplegia and hemiparesis following cerebral infarction affecting right dominant side: Secondary | ICD-10-CM

## 2017-07-04 NOTE — Progress Notes (Signed)
Subjective:    Patient ID: Sharon Chan, female    DOB: 1976-05-05, 41 y.o.   MRN: [id number]  HPI  Getting frustrated with her memory, difficulty retaining information  F/u with  Dr Lucianne Muss from endocrine, f/u thyroid scan 1 year  Sees Joaquin Courts BP from [facility] who is prescribing her meds  Dressing and bathing herself, mom helps with getting in and out of bath Right side numb , not using R hand  Falling constantly mostly at home, not eating, using protein shakes with meals.  Feels like she is eating better with a better diet, mainly veggies Poor taste, no salt ,sugar or caffeine Takes metformin for polycystic kidney disease  Pt has episodes of transient blindness mainly Right temporal visual field No double vision but has blurring She states that her visual issues, started after her stroke  Sees counselor at church  Pain Inventory Average Pain 7 Pain Right Now 9 My pain is tingling and aching  In the last 24 hours, has pain interfered with the following? General activity 8 Relation with others 8 Enjoyment of life 10 What TIME of day is your pain at its worst? morning, night  Sleep (in general) Poor  Pain is worse with: walking, standing and some activites Pain improves with: rest, therapy/exercise and medication Relief from Meds: 0  Mobility walk with assistance use a cane how many minutes can you walk? 10-15 ability to climb steps?  yes do you drive?  yes use a wheelchair Do you have any goals in this area?  yes  Function disabled: date disabled . I need assistance with the following:  dressing, bathing, meal prep, household duties and shopping Do you have any goals in this area?  yes  Neuro/Psych weakness numbness tingling trouble walking dizziness anxiety  Prior Studies hospital f/u  Physicians involved in your care hospital f/u   Family History  Problem Relation Age of Onset  . Heart attack Mother   . Post-traumatic stress  disorder Father        committed suicide  . Diabetes Father   . Hypertension Sister   . Thyroid cancer Paternal Grandmother    Social History   Social History  . Marital status: Married    Spouse name: N/A  . Number of children: 0  . Years of education: N/A   Social History Main Topics  . Smoking status: Never Smoker  . Smokeless tobacco: Never Used  . Alcohol use No  . Drug use: Unknown  . Sexual activity: Not Asked   Other Topics Concern  . None   Social History Narrative   Lives at home, mom currently staying with her   Left-handed   Caffeine: occasional decaf coffee or raspberry tea   Past Surgical History:  Procedure Laterality Date  . LAPAROSCOPIC CHOLECYSTECTOMY     Past Medical History:  Diagnosis Date  . CVA (cerebral vascular accident) (HCC)   . Polycystic disease, ovaries   . Seizures (HCC)   . Sickle cell trait (HCC)    BP (!) 151/103 (BP Location: Left Arm, Patient Position: Sitting, Cuff Size: Normal)   Pulse 77   Resp 14   SpO2 98%   Opioid Risk Score:   Fall Risk Score:  `1  Depression screen PHQ 2/9  Depression screen [facility] 2/9 04/14/2017 03/17/2017  Decreased Interest 0 0  Down, Depressed, Hopeless 0 0  PHQ - 2 Score 0 0    Review of Systems  Constitutional: Positive for appetite change.  HENT: Negative.   Eyes: Negative.   Respiratory: Negative.   Cardiovascular: Negative.   Gastrointestinal: Positive for constipation.  Endocrine: Negative.   Genitourinary: Negative.   Musculoskeletal: Positive for arthralgias, back pain, gait problem, myalgias and neck pain.  Skin: Negative.   Allergic/Immunologic: Negative.   Neurological: Positive for dizziness, weakness, numbness and headaches.       Tingling   Hematological: Negative.   Psychiatric/Behavioral: The patient is nervous/anxious.        Objective:   Physical Exam  Constitutional: She appears well-developed and well-nourished. No distress.  HENT:  Head: Normocephalic and  atraumatic.  Eyes: Pupils are equal, round, and reactive to light. Conjunctivae and EOM are normal.  Neck: Normal range of motion.  Pulmonary/Chest: Effort normal and breath sounds normal. No respiratory distress. She has no wheezes. She has no rales.  Skin: She is not diaphoretic.  Psychiatric: Her affect is blunt. Her speech is delayed. She is slowed. Cognition and memory are impaired.  Nursing note and vitals reviewed.  Visual fields intact Motor strength is 5 minus in the right deltoid by stress of grip, hip flexion, extension, ankle dorsi flexion 5/5 in left deltoid, biceps, triceps, grip, hip flexion, knee extension and dorsal flexion. Sensation is absent to light touch, pinprick and reception in the. Right upper and right lower limb, also right hemifacial numbness with pinprick and light touch Some mild dysmetria, right finger-nose-finger Positive dysdiadochokinesis with rapid alternating supination and pronation of the right upper extremity. Right finger- Thumb opposition is reduced in speed     Assessment & Plan:  1. Left thalamic infarct, residual right-sided hemisensory deficits, which are quite dense, in addition, she does have a subcortical aphasia. We discussed the typical recovery pattern after stroke for both sensory deficits as well as speech and cognitive deficits. We discussed that even after the usual plateau at 6-12 months. They can be some more subtle improvements in sensation We'll continue outpatient speech therapy  2. Dysphagia after stroke, ready to repeat modified barium swallow  Physical medicine and rehabilitation. M.D. follow-up in 4-6 weeks Follow-up with endocrinology Follow-up with primary care

## 2017-07-05 ENCOUNTER — Ambulatory Visit: Payer: Medicare Other

## 2017-07-05 DIAGNOSIS — R4701 Aphasia: Secondary | ICD-10-CM

## 2017-07-05 DIAGNOSIS — R1310 Dysphagia, unspecified: Secondary | ICD-10-CM

## 2017-07-05 DIAGNOSIS — R41841 Cognitive communication deficit: Secondary | ICD-10-CM

## 2017-07-05 NOTE — Patient Instructions (Signed)
GO TO THE DRIVE THROUGH!

## 2017-07-05 NOTE — Therapy (Signed)
[facility] [address] [place], [place], [address] Phone: [phone]   Fax:  [phone]  Speech Language Pathology Treatment  Patient Details  Name: Sharon Chan MRN: [id number] Date of Birth: 29-May-1976 No Data Recorded  Encounter Date: 07/05/2017      End of Session - 07/05/17 1634    Visit Number 21   Number of Visits 27   Date for SLP Re-Evaluation 08/04/17   SLP Start Time 1103   SLP Stop Time  1145   SLP Time Calculation (min) 42 min   Activity Tolerance Patient tolerated treatment well      Past Medical History:  Diagnosis Date  . CVA (cerebral vascular accident) ([place])   . Polycystic disease, ovaries   . Seizures ([place])   . Sickle cell trait [facility])     Past Surgical History:  Procedure Laterality Date  . LAPAROSCOPIC CHOLECYSTECTOMY      There were no vitals filed for this visit.      Subjective Assessment - 07/05/17 1106    Subjective "I saw Dr. Letta Pate yesterday adn I have a swallow test on the 27th."   Currently in Pain? Yes   Pain Score 6    Pain Location Head   Pain Descriptors / Indicators Headache   Pain Type Chronic pain   Pain Onset More than a month ago   Pain Frequency Constant   Aggravating Factors  unsure- blood pressure?   Pain Relieving Factors caffeine               ADULT SLP TREATMENT - 07/05/17 1124      General Information   Behavior/Cognition Alert;Pleasant mood;Cooperative     Treatment Provided   Treatment provided Cognitive-Linquistic     Cognitive-Linquistic Treatment   Treatment focused on Aphasia   Skilled Treatment SLP worked with pt re: aphasia in situation outside [place] room and compensate for anomia.  Upon moving in to kitchen pt stated, "This is good because the gym gives me anxiety." Pt with incr'd frequency of dysflent/hesitating speech initially but then decr'd frequency over 15 minutes mod complex conversation. Pt with appropriate  topic maintenance over 15 minutes period. At end of sesion pt stated, "It was good to get out of my safe zone." Pt indicated his "safe zone" included not going to drive thru's. SLP made pt 's homework included a drive thru visit.     Assessment / Recommendations / Plan   Plan Continue with current plan of care     Progression Toward Goals   Progression toward goals Progressing toward goals            SLP Short Term Goals - 07/03/17 1014      SLP SHORT TERM GOAL #1   Title Pt will tell four ways to compensate for anomia   Status Achieved     SLP SHORT TERM GOAL #2   Title pt will complete mod complex naming tasks independently with 90% success over three sessions   Status Not Met     SLP SHORT TERM GOAL #3   Title pt will engage functionally in 10 minutes mod complex/complex conversation using compensatory strategies PRN over three sessions   Status Achieved     SLP SHORT TERM GOAL #4   Title pt will demo WFL alternating attention skills with two or more mod complex cognitive linguistic tasks with 95% success on both   Status Achieved     SLP SHORT TERM GOAL #5  Title pt will use expressive language compensations in 10 minutes mod complex conversation over three sessions   Status Achieved     SLP SHORT TERM GOAL #6   Title pt will perform swallow precautions with modified indpendence over 2 sessions   Status Achieved          SLP Long Term Goals - 07/05/17 1126      SLP LONG TERM GOAL #1   Title pt will engage functionally in 20 minutes mod complex -complex conversation outside [place] room, using compensatory strategies PRN over three sessions   Time 2   Period Weeks   Status On-going     SLP LONG TERM GOAL #2   Title pt will talk with SLP outside [place] room for 15 mintues, using compensatory strategies for anomia x2 sessions   Time 2   Period Weeks   Status On-going     SLP LONG TERM GOAL #3   Title pt will read 5-6 sentence paragraph stimuli and answer questions  with 90% success and modified independence (compensations, self-correction)   Time 2   Period Weeks   Status On-going     SLP LONG TERM GOAL #4   Title pt will demo swallow precautions with POs in 3 sessions   Time 2   Period Weeks   Status On-going     SLP LONG TERM GOAL #5   Title Pt will report daily functional and effective use of attention strategies for participating in daily activities and appointments.   Time 2   Period Weeks   Status On-going          Plan - 07/05/17 1634    Clinical Impression Statement Pt endorses difficulty with expression in higher-pressure situations and in situations she does not know anyone, even her church. Because of this SLP talked with pt outside [place] room today near the gym (in kitchen). Pt initially had more dysfluent speech but over the time settled down to almost same frequency dysfluency as in [place] room. Skilled ST is recommended to cont, for improving her verbal expression as close to PLOF as possible, as well as increasing her high level attention ability. MBS scheduled for 07-10-17 and HEP may be warranted following that assessment.    Speech Therapy Frequency 2x / week   Treatment/Interventions Language facilitation;Compensatory techniques;Internal/external aids;SLP instruction and feedback;Multimodal communcation approach;Patient/family education;Functional tasks;Cognitive reorganization;Aspiration precaution training;Pharyngeal strengthening exercises;Diet toleration management by SLP;Cueing hierarchy   Potential to Achieve Goals Good   Potential Considerations Severity of impairments   SLP Home Exercise Plan notetaking tasks   Consulted and Agree with Plan of Care Patient      Patient will benefit from skilled therapeutic intervention in order to improve the following deficits and impairments:   Aphasia  Cognitive communication deficit  Dysphagia, unspecified type    Problem List Patient Active Problem List   Diagnosis Date Noted   . Hemiparesis affecting right side as late effect of cerebrovascular accident ([place]) 03/23/2017  . Lacunar infarct, acute ([place]) 03/23/2017  . Visual disturbance as complication of stroke 40/98/1191  . Adjustment disorder with anxious mood   . 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 Sickle cell trait ([place])   . Hyperlipidemia   . Thyroid nodule   . Changes in vision   . Acute ischemic stroke ([place])   . Dysphagia, post-stroke   . Benign essential HTN   . Polycystic kidney disease   . Prediabetes   . Acute intractable  headache   . Leukocytosis   . Stroke (cerebrum) ([place]) 02/25/2017    [facility] ,[place], [place]  07/05/2017, 4:38 PM  [facility] [address] [place], [place], [address] Phone: [phone]   Fax:  [phone]   Name: Sharon Chan MRN: [id number] Date of Birth: Mar 16, 1976

## 2017-07-10 ENCOUNTER — Ambulatory Visit (HOSPITAL_COMMUNITY)
Admission: RE | Admit: 2017-07-10 | Discharge: 2017-07-10 | Disposition: A | Discharge status: Home / Self Care | Payer: Medicare Other | Source: Ambulatory Visit | Attending: Physical Medicine & Rehabilitation | Admitting: Physical Medicine & Rehabilitation

## 2017-07-10 ENCOUNTER — Ambulatory Visit: Payer: Medicare Other | Admitting: Speech Pathology

## 2017-07-10 DIAGNOSIS — R4701 Aphasia: Secondary | ICD-10-CM | POA: Diagnosis not present

## 2017-07-10 DIAGNOSIS — R41841 Cognitive communication deficit: Secondary | ICD-10-CM | POA: Diagnosis not present

## 2017-07-10 DIAGNOSIS — I69391 Dysphagia following cerebral infarction: Secondary | ICD-10-CM | POA: Insufficient documentation

## 2017-07-10 DIAGNOSIS — R1310 Dysphagia, unspecified: Secondary | ICD-10-CM | POA: Diagnosis not present

## 2017-07-10 NOTE — Therapy (Signed)
[facility] [address] [place], [place], [address] Phone: [phone]   Fax:  [phone]  Speech Language Pathology Treatment  Patient Details  Name: Sharon Chan MRN: [id number] Date of Birth: 07-14-1976 No Data Recorded  Encounter Date: 07/10/2017      End of Session - 07/10/17 1021    Visit Number 22   Number of Visits 27   Date for SLP Re-Evaluation 08/04/17   SLP Start Time 1016   SLP Stop Time  1100   SLP Time Calculation (min) 44 min   Activity Tolerance Patient tolerated treatment well      Past Medical History:  Diagnosis Date  . CVA (cerebral vascular accident) ([place])   . Polycystic disease, ovaries   . Seizures ([place])   . Sickle cell trait [facility])     Past Surgical History:  Procedure Laterality Date  . LAPAROSCOPIC CHOLECYSTECTOMY      There were no vitals filed for this visit.      Subjective Assessment - 07/10/17 1017    Subjective "When I leave here I have the swallow study."   Currently in Pain? Yes   Pain Score 8    Pain Location Head   Pain Descriptors / Indicators Headache   Pain Radiating Towards neck   Pain Onset More than a month ago               ADULT SLP TREATMENT - 07/10/17 1019      General Information   Behavior/Cognition Alert;Pleasant mood;Cooperative   Patient Positioning Upright in chair   Oral care provided N/A     Treatment Provided   Treatment provided Cognitive-Linquistic     Cognitive-Linquistic Treatment   Treatment focused on Aphasia   Skilled Treatment SLP engaged pt in 25 min moderately complex-complex conversation outside treatment room in kitchen. Pt initially demo'd increase in dysfluent speech, hesitations however compensates with strategies for anomia independently. Fluency improved as conversation continued. SLP provided education re: pt's progress in therapy, upcoming MBS. Reports carryover of strategies for attention during  reading, states she has been reading a book with improved focus using notetaking.     Assessment / Recommendations / Plan   Plan Continue with current plan of care     Progression Toward Goals   Progression toward goals Progressing toward goals          SLP Education - 07/10/17 1535    Education provided Yes   Education Details what to expect at [facility] procedure   Person(s) Educated Patient   Methods Explanation   Comprehension Verbalized understanding          SLP Short Term Goals - 07/10/17 1542      SLP SHORT TERM GOAL #1   Title Pt will tell four ways to compensate for anomia   Status Achieved     SLP SHORT TERM GOAL #2   Title pt will complete mod complex naming tasks independently with 90% success over three sessions   Status Not Met     SLP SHORT TERM GOAL #3   Title pt will engage functionally in 10 minutes mod complex/complex conversation using compensatory strategies PRN over three sessions   Status Achieved     SLP [place] #4   Title pt will demo WFL alternating attention skills with two or more mod complex cognitive linguistic tasks with 95% success on both   Status Achieved     SLP SHORT TERM GOAL #5  Title pt will use expressive language compensations in 10 minutes mod complex conversation over three sessions   Status Achieved     SLP SHORT TERM GOAL #6   Title pt will perform swallow precautions with modified indpendence over 2 sessions   Status Achieved          SLP Long Term Goals - 07/10/17 1542      SLP LONG TERM GOAL #1   Title pt will engage functionally in 20 minutes mod complex -complex conversation outside [place] room, using compensatory strategies PRN over three sessions   Time 1   Period Weeks   Status On-going     SLP LONG TERM GOAL #2   Title pt will talk with SLP outside [place] room for 15 mintues, using compensatory strategies for anomia x2 sessions   Time 1   Period Weeks   Status On-going     SLP LONG TERM GOAL #3    Title pt will read 5-6 sentence paragraph stimuli and answer questions with 90% success and modified independence (compensations, self-correction)   Time 1   Period Weeks   Status On-going     SLP LONG TERM GOAL #4   Title pt will demo swallow precautions with POs in 3 sessions   Time 1   Period Weeks   Status Deferred     SLP LONG TERM GOAL #5   Title Pt will report daily functional and effective use of attention strategies for participating in daily activities and appointments.   Time 1   Period Weeks   Status On-going          Plan - 07/10/17 1551    Clinical Impression Statement Pt endorses difficulty with expression in higher-pressure situations; SLP talked with pt outside ST room again this session near the gym (in kitchen). Pt initially had more dysfluent speech but over the time settled down to almost same frequency dysfluency as in [place] room. Skilled ST is recommended to cont, for improving her verbal expression as close to PLOF as possible, as well as increasing her high level attention ability. Pt's MBS completed after this morning's session, which revealed normal oropharyngeal swallow function, recommended possible GI follow-up. Will review results and recommendations with patient next session. Anticipate she will be ready for d/c in next 1-2 visits.    Speech Therapy Frequency 2x / week   Potential to Achieve Goals Good   Potential Considerations Severity of impairments   SLP Home Exercise Plan notetaking tasks   Consulted and Agree with Plan of Care Patient      Patient will benefit from skilled therapeutic intervention in order to improve the following deficits and impairments:   Aphasia  Cognitive communication deficit    Problem List Patient Active Problem List   Diagnosis Date Noted  . Hemiparesis affecting right side as late effect of cerebrovascular accident ([place]) 03/23/2017  . Lacunar infarct, acute ([place]) 03/23/2017  . Visual disturbance as complication  of stroke 27/04/2375  . Adjustment disorder with anxious mood   . 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 Sickle cell trait ([place])   . Hyperlipidemia   . Thyroid nodule   . Changes in vision   . Acute ischemic stroke ([place])   . Dysphagia, post-stroke   . Benign essential HTN   . Polycystic kidney disease   . Prediabetes   . Acute intractable headache   . Leukocytosis   . Stroke (cerebrum) ([place]) 02/25/2017   [facility]  Rhunette Croft, [place], [name] Speech-Language Pathologist  Aliene Altes 07/10/2017, 3:55 PM  [facility] [address] [place], [place], [address] Phone: [phone]   Fax:  [phone]   Name: Sharon Chan MRN: [id number] Date of Birth: 04-13-1976

## 2017-07-12 ENCOUNTER — Ambulatory Visit: Payer: Medicare Other

## 2017-07-12 ENCOUNTER — Ambulatory Visit: Payer: Medicaid Other | Admitting: Nurse Practitioner

## 2017-07-12 DIAGNOSIS — R1310 Dysphagia, unspecified: Secondary | ICD-10-CM | POA: Diagnosis not present

## 2017-07-12 DIAGNOSIS — R41841 Cognitive communication deficit: Secondary | ICD-10-CM | POA: Diagnosis not present

## 2017-07-12 DIAGNOSIS — R4701 Aphasia: Secondary | ICD-10-CM | POA: Diagnosis not present

## 2017-07-12 NOTE — Therapy (Signed)
[facility] [address] [place], [place], [address] Phone: [phone]   Fax:  [phone]  Speech Language Pathology Treatment  Patient Details  Name: Sharon Chan MRN: [id number] Date of Birth: 1976-04-15 No Data Recorded  Encounter Date: 07/12/2017      End of Session - 07/12/17 1355    Visit Number 23   Number of Visits 27   Date for SLP Re-Evaluation 08/04/17   SLP Start Time 1103   SLP Stop Time  1145   SLP Time Calculation (min) 42 min   Activity Tolerance Patient tolerated treatment well      Past Medical History:  Diagnosis Date  . CVA (cerebral vascular accident) ([name])   . Polycystic disease, ovaries   . Seizures ([place])   . Sickle cell trait [facility])     Past Surgical History:  Procedure Laterality Date  . LAPAROSCOPIC CHOLECYSTECTOMY      There were no vitals filed for this visit.      Subjective Assessment - 07/12/17 1110    Subjective Pt with WNL swallow study. Agreeable to d/c next week.               ADULT SLP TREATMENT - 07/12/17 1112      General Information   Behavior/Cognition Alert;Pleasant mood;Cooperative     Treatment Provided   Treatment provided Cognitive-Linquistic     Cognitive-Linquistic Treatment   Treatment focused on Aphasia   Skilled Treatment SLP engaged pt in 15 minutes moderately complex-complex conversation inside tx room re: compensations for attention pt is currently using - she described two of them to SLP and stated she has used them sucessfully for the last 1-2 weeks. In 20 minutes mod complex/copmlex conversation outside treatment room in [place] hallway, pt had 3 anomic episodes and in two of them rephrased and/or changed wording to compensate. In the last instance pt waited and generated the appropriate word within 5 seconds.      Assessment / Recommendations / Plan   Plan Continue with current plan of care  d/c next week     Progression  Toward Goals   Progression toward goals Progressing toward goals            SLP Short Term Goals - 07/12/17 1123      SLP SHORT TERM GOAL #1   Title Pt will tell four ways to compensate for anomia   Status Achieved     SLP SHORT TERM GOAL #2   Title pt will complete mod complex naming tasks independently with 90% success over three sessions   Status Not Met     SLP SHORT TERM GOAL #3   Title pt will engage functionally in 10 minutes mod complex/complex conversation using compensatory strategies PRN over three sessions   Status Achieved     SLP [place] #4   Title pt will demo WFL alternating attention skills with two or more mod complex cognitive linguistic tasks with 95% success on both   Status Achieved     SLP SHORT TERM GOAL #5   Title pt will use expressive language compensations in 10 minutes mod complex conversation over three sessions   Status Achieved     SLP SHORT TERM GOAL #6   Title pt will perform swallow precautions with modified indpendence over 2 sessions   Status Achieved          SLP Long Term Goals - 07/12/17 1123      SLP LONG  TERM GOAL #1   Title pt will engage functionally in 20 minutes mod complex -complex conversation outside [place] room, using compensatory strategies PRN over three sessions   Baseline 07-11-27, 07-12-17   Time 1   Period Weeks   Status On-going     SLP LONG TERM GOAL #2   Title pt will talk with SLP outside [place] room for 15 mintues, using compensatory strategies for anomia x2 sessions   Status Deferred  due to LTG #1     SLP LONG TERM GOAL #3   Title pt will read 5-6 sentence paragraph stimuli and answer questions with 90% success and modified independence (compensations, self-correction)   Time 1   Period Weeks   Status On-going     SLP LONG TERM GOAL #4   Title pt will demo swallow precautions with POs in 3 sessions   Time 1   Period Weeks   Status Deferred     SLP LONG TERM GOAL #5   Title Pt will report  daily functional and effective use of attention strategies for participating in daily activities and appointments.   Status Achieved  reading - taking notes          Plan - 07/12/17 1356    Clinical Impression Statement Pt with WNL swallowing. Pt reported today that conversation with strangers is easier than it was approx 3-4 weeks ago and that she is slowly accepting her speech may not return to 100% PLOF. SLP talked with pt outside [place] room in teh checkout hallway - see "skilled intervention" for details. Skilled ST is recommended to cont, for improving her verbal expression as close to PLOF as possible, as well as increasing her high level attention ability. Pt is agreeable for discharge next week.    Speech Therapy Frequency 2x / week   Potential to Achieve Goals Good   Potential Considerations Severity of impairments   SLP Home Exercise Plan notetaking tasks   Consulted and Agree with Plan of Care Patient      Patient will benefit from skilled therapeutic intervention in order to improve the following deficits and impairments:   Aphasia  Cognitive communication deficit    Problem List Patient Active Problem List   Diagnosis Date Noted  . Hemiparesis affecting right side as late effect of cerebrovascular accident ([place]) 03/23/2017  . Lacunar infarct, acute ([place]) 03/23/2017  . Visual disturbance as complication of stroke 59/56/3875  . Adjustment disorder with anxious mood   . 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 Sickle cell trait ([place])   . Hyperlipidemia   . Thyroid nodule   . Changes in vision   . Acute ischemic stroke ([place])   . Dysphagia, post-stroke   . Benign essential HTN   . Polycystic kidney disease   . Prediabetes   . Acute intractable headache   . Leukocytosis   . Stroke (cerebrum) ([place]) 02/25/2017    [facility] ,[place], [place]  07/12/2017, 5:07 PM  [facility] [address] [place], [place], [address] Phone: [phone]   Fax:  [phone]   Name: Sharon Chan MRN: [id number] Date of Birth: 1976-10-25

## 2017-07-18 ENCOUNTER — Ambulatory Visit: Payer: Medicare Other | Admitting: Family Medicine

## 2017-07-19 ENCOUNTER — Ambulatory Visit: Payer: Medicare Other | Attending: Physical Medicine & Rehabilitation

## 2017-07-19 DIAGNOSIS — R4701 Aphasia: Secondary | ICD-10-CM | POA: Diagnosis not present

## 2017-07-19 DIAGNOSIS — R41841 Cognitive communication deficit: Secondary | ICD-10-CM

## 2017-07-19 NOTE — Therapy (Signed)
[facility] [address] [place], [place], [address] Phone: [phone]   Fax:  [phone]  Speech Language Pathology Treatment  Patient Details  Name: Sharon Chan MRN: [id number] Date of Birth: Nov 05, 1976 No Data Recorded  Encounter Date: 07/19/2017      End of Session - 07/19/17 1714    Visit Number 24   Date for SLP Re-Evaluation 08/04/17   SLP Start Time 1017   SLP Stop Time  1100   SLP Time Calculation (min) 43 min   Activity Tolerance Patient tolerated treatment well      Past Medical History:  Diagnosis Date  . CVA (cerebral vascular accident) ([place])   . Polycystic disease, ovaries   . Seizures ([place])   . Sickle cell trait [facility])     Past Surgical History:  Procedure Laterality Date  . LAPAROSCOPIC CHOLECYSTECTOMY      There were no vitals filed for this visit.      Subjective Assessment - 07/19/17 1030    Currently in Pain? Yes   Pain Score 6    Pain Location Head   Pain Orientation --  headache   Pain Descriptors / Indicators Headache   Pain Type Chronic pain   Pain Onset More than a month ago   Pain Frequency Constant   Pain Relieving Factors taking meds after 11 am               ADULT SLP TREATMENT - 07/19/17 1032      General Information   Behavior/Cognition Alert;Pleasant mood;Cooperative     Treatment Provided   Treatment provided Cognitive-Linquistic     Cognitive-Linquistic Treatment   Treatment focused on Aphasia   Skilled Treatment SLP engaged pt in 20 minutes conversation in [place] room (mod complex), and 15 minutes outside ST room in mod complex/complex conversation. Pt functional word finding skills in both situations. Pt reported she initiated conversation with strangers in the lobby today prior to [place]. Pt read 5-paragraph selection and took ntoes and underlined. She demo'd understanding by answering ?s 100% succes.     Assessment / Recommendations / Plan   Plan Continue with current plan of care     Progression Toward Goals   Progression toward goals Progressing toward goals            SLP Short Term Goals - 07/12/17 1123      SLP SHORT TERM GOAL #1   Title Pt will tell four ways to compensate for anomia   Status Achieved     SLP SHORT TERM GOAL #2   Title pt will complete mod complex naming tasks independently with 90% success over three sessions   Status Not Met     SLP SHORT TERM GOAL #3   Title pt will engage functionally in 10 minutes mod complex/complex conversation using compensatory strategies PRN over three sessions   Status Achieved     SLP SHORT TERM GOAL #4   Title pt will demo WFL alternating attention skills with two or more mod complex cognitive linguistic tasks with 95% success on both   Status Achieved     SLP SHORT TERM GOAL #5   Title pt will use expressive language compensations in 10 minutes mod complex conversation over three sessions   Status Achieved     SLP SHORT TERM GOAL #6   Title pt will perform swallow precautions with modified indpendence over 2 sessions   Status Achieved  SLP Long Term Goals - 07/19/17 1714      SLP LONG TERM GOAL #1   Title pt will engage functionally in 20 minutes mod complex -complex conversation outside [place] room, using compensatory strategies PRN over three sessions   Baseline 07-11-27, 07-12-17   Time 1   Period Weeks   Status On-going     SLP LONG TERM GOAL #2   Title pt will talk with SLP outside [place] room for 15 mintues, using compensatory strategies for anomia x2 sessions   Status Deferred  due to LTG #1     SLP LONG TERM GOAL #3   Title pt will read 5-6 sentence paragraph stimuli and answer questions with 90% success and modified independence (compensations, self-correction)   Status Achieved     SLP LONG TERM GOAL #4   Title pt will demo swallow precautions with POs in 3 sessions   Time 1   Period Weeks   Status Deferred     SLP LONG TERM  GOAL #5   Title Pt will report daily functional and effective use of attention strategies for participating in daily activities and appointments.   Status Achieved  reading - taking notes          Plan - 07/19/17 1105    Clinical Impression Statement Pt continues to report that conversation with strangers is easier than it was approx 3-4 weeks ago and pt struck up conversation with two strangers in waiting room today. SLP talked with pt outside [place] room in another room today- see "skilled intervention" for details. Skilled ST is recommended to cont, for one more session to ensure consistency of progress with verbal expression     Speech Therapy Frequency 2x / week   Potential to Achieve Goals Good   Potential Considerations Severity of impairments   SLP Home Exercise Plan notetaking tasks   Consulted and Agree with Plan of Care Patient      Patient will benefit from skilled therapeutic intervention in order to improve the following deficits and impairments:   Aphasia  Cognitive communication deficit    Problem List Patient Active Problem List   Diagnosis Date Noted  . Hemiparesis affecting right side as late effect of cerebrovascular accident ([place]) 03/23/2017  . Lacunar infarct, acute ([place]) 03/23/2017  . Visual disturbance as complication of stroke 73/73/6681  . Adjustment disorder with anxious mood   . 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 Sickle cell trait ([place])   . Hyperlipidemia   . Thyroid nodule   . Changes in vision   . Acute ischemic stroke ([place])   . Dysphagia, post-stroke   . Benign essential HTN   . Polycystic kidney disease   . Prediabetes   . Acute intractable headache   . Leukocytosis   . Stroke (cerebrum) ([place]) 02/25/2017    [facility] ,[place], [place]  07/19/2017, 5:15 PM  [facility] [address] [place], [place], [address] Phone: [phone]   Fax:   [phone]   Name: Sharon Chan MRN: [id number] Date of Birth: January 26, 1976

## 2017-07-21 ENCOUNTER — Ambulatory Visit: Payer: Medicare Other

## 2017-07-21 DIAGNOSIS — R4701 Aphasia: Secondary | ICD-10-CM | POA: Diagnosis not present

## 2017-07-21 DIAGNOSIS — R41841 Cognitive communication deficit: Secondary | ICD-10-CM

## 2017-07-21 NOTE — Therapy (Signed)
[facility] [address] [place], [place], [address] Phone: [phone]   Fax:  [phone]  Speech Language Pathology Treatment  Patient Details  Name: Sharon Chan MRN: [id number] Date of Birth: September 24, 1976 No Data Recorded  Encounter Date: 07/21/2017      End of Session - 07/21/17 1725    Visit Number 25   Number of Visits 27   Date for SLP Re-Evaluation 08/04/17   SLP Start Time 0934   SLP Stop Time  1015   SLP Time Calculation (min) 41 min   Activity Tolerance Patient tolerated treatment well      Past Medical History:  Diagnosis Date  . CVA (cerebral vascular accident) ([place])   . Polycystic disease, ovaries   . Seizures ([place])   . Sickle cell trait [facility])     Past Surgical History:  Procedure Laterality Date  . LAPAROSCOPIC CHOLECYSTECTOMY      There were no vitals filed for this visit.      Subjective Assessment - 07/21/17 0940    Subjective Pt with d/c today.    Currently in Pain? Yes   Pain Score 6    Pain Location Head   Pain Orientation Mid   Pain Descriptors / Indicators Headache   Pain Type Chronic pain   Pain Onset More than a month ago   Pain Frequency Constant   Aggravating Factors  noise, light, stress   Pain Relieving Factors taking meds after 11am, caffeine               ADULT SLP TREATMENT - 07/21/17 0942      General Information   Behavior/Cognition Alert;Pleasant mood;Cooperative     Treatment Provided   Treatment provided Cognitive-Linquistic     Cognitive-Linquistic Treatment   Treatment focused on Aphasia   Skilled Treatment SLP engaged pt in 20 minutes conversation in [place] room (mod complex), and 20 minutes outside ST room in mod complex/complex conversation. Pt demonstrated functional word finding skills in both levels of conversation. Pt reported she spent over two hours out with friends yesterday evening and talked throughout the evening.       Assessment / Recommendations / Plan   Plan Discharge SLP treatment due to (comment)  met goals - pt agrees with d/c     Progression Toward Goals   Progression toward goals Goals met, education completed, patient discharged from [facility] [place] - 07/12/17 [place] #1   Title Pt will tell four ways to compensate for anomia   Status Achieved     SLP SHORT TERM GOAL #2   Title pt will complete mod complex naming tasks independently with 90% success over three sessions   Status Not Met     SLP SHORT TERM GOAL #3   Title pt will engage functionally in 10 minutes mod complex/complex conversation using compensatory strategies PRN over three sessions   Status Achieved     SLP SHORT TERM GOAL #4   Title pt will demo WFL alternating attention skills with two or more mod complex cognitive linguistic tasks with 95% success on both   Status Achieved     SLP SHORT TERM GOAL #5   Title pt will use expressive language compensations in 10 minutes mod complex conversation over three sessions   Status Achieved     SLP SHORT TERM GOAL #6  Title pt will perform swallow precautions with modified indpendence over 2 sessions   Status Achieved          SLP Long Term Goals - Aug 19, 2017 1726      SLP LONG TERM GOAL #1   Title pt will engage functionally in 20 minutes mod complex -complex conversation outside [place] room, using compensatory strategies PRN over three sessions   Status Achieved     SLP LONG TERM GOAL #2   Title pt will talk with SLP outside [place] room for 15 mintues, using compensatory strategies for anomia x2 sessions   Status Deferred  due to LTG #1     SLP LONG TERM GOAL #3   Title pt will read 5-6 sentence paragraph stimuli and answer questions with 90% success and modified independence (compensations, self-correction)   Status Achieved     SLP LONG TERM GOAL #4   Title pt will demo swallow precautions with POs in 3 sessions   Time 1    Period Weeks   Status Deferred  due to WNL modified barium swallow     SLP LONG TERM GOAL #5   Title Pt will report daily functional and effective use of attention strategies for participating in daily activities and appointments.   Status Achieved  reading - taking notes          Plan - 08-19-2017 1725    Clinical Impression Statement Pt continues to report that conversation with others is going well and she has more confidence in speaking. See "skilled intervention" for details on today's session. Pt will be d/c'd from ST at this time for meeting all goals.   Treatment/Interventions Language facilitation;Compensatory techniques;Internal/external aids;SLP instruction and feedback;Multimodal communcation approach;Patient/family education;Functional tasks;Cognitive reorganization;Aspiration precaution training;Pharyngeal strengthening exercises;Diet toleration management by SLP;Cueing hierarchy   Potential to Achieve Goals Good   Potential Considerations Severity of impairments   SLP Home Exercise Plan notetaking tasks   Consulted and Agree with Plan of Care Patient      Patient will benefit from skilled therapeutic intervention in order to improve the following deficits and impairments:   Aphasia  Cognitive communication deficit      G-Codes - August 19, 2017 1727    Functional Assessment Tool Used noms, clinical judgment   Functional Limitations Spoken language expressive   Spoken Language Expression Goal Status [phone]) At least 1 percent but less than 20 percent impaired, limited or restricted   Spoken Language Expression Discharge Status [phone]) At least 1 percent but less than 20 percent impaired, limited or restricted     [place]  Visits from Start of Care: 25  Current functional level related to goals / functional outcomes: Pt's speech/language goals are above, please see those goals for outcomes. Pt exhibits word finding episodes but compensates  nicely in mod complex/complex conversation. Her confidence of speaking has incr'd dramatically over hte therapy course. Pt endorses decr'd memory is lingering, but she is compensating well. Pt reports this hinders her reading ability, and she compensates by underlining/highlighting and taking notes in margins.   Remaining deficits: Mild cognitive linguistic deficits including memory, mild expressive aphasia.   Education / Equipment: Compensations for cognitive linguistic (memory) deficits, for word finding deficits  Plan: Patient agrees to discharge.  Patient goals were met. Patient is being discharged due to meeting the stated rehab goals.  ?????       Problem List Patient Active Problem List   Diagnosis Date Noted  . Hemiparesis affecting right side as late effect  of cerebrovascular accident ([place]) 03/23/2017  . Lacunar infarct, acute ([place]) 03/23/2017  . Visual disturbance as complication of stroke 43/32/9518  . Adjustment disorder with anxious mood   . 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) 03/02/2017  . Sickle cell trait ([place])   . Hyperlipidemia   . Thyroid nodule   . Changes in vision   . Acute ischemic stroke ([place])   . Dysphagia, post-stroke   . Benign essential HTN   . Polycystic kidney disease   . Prediabetes   . Acute intractable headache   . Leukocytosis   . Stroke (cerebrum) ([place]) 02/25/2017    [facility] ,[place], [place]  07/21/2017, 5:29 PM  [facility] [address] [place], [place], [address] Phone: [phone]   Fax:  [phone]   Name: Sharon Chan MRN: [id number] Date of Birth: [birth year]

## 2017-07-24 ENCOUNTER — Ambulatory Visit: Payer: Medicare Other | Admitting: Speech Pathology

## 2017-07-25 ENCOUNTER — Encounter: Payer: Self-pay | Admitting: Family Medicine

## 2017-07-25 ENCOUNTER — Ambulatory Visit (INDEPENDENT_AMBULATORY_CARE_PROVIDER_SITE_OTHER): Payer: Medicare Other | Admitting: Family Medicine

## 2017-07-25 VITALS — BP 138/96 | HR 92 | Temp 98.6°F | Resp 14 | Ht 62.0 in | Wt 200.0 lb

## 2017-07-25 DIAGNOSIS — I10 Essential (primary) hypertension: Secondary | ICD-10-CM

## 2017-07-25 DIAGNOSIS — E119 Type 2 diabetes mellitus without complications: Secondary | ICD-10-CM

## 2017-07-25 DIAGNOSIS — G4459 Other complicated headache syndrome: Secondary | ICD-10-CM

## 2017-07-25 DIAGNOSIS — I639 Cerebral infarction, unspecified: Secondary | ICD-10-CM

## 2017-07-25 DIAGNOSIS — I6930 Unspecified sequelae of cerebral infarction: Secondary | ICD-10-CM

## 2017-07-25 LAB — POCT GLYCOSYLATED HEMOGLOBIN (HGB A1C): Hemoglobin A1C: 5.8

## 2017-07-25 MED ORDER — BUTALBITAL-APAP-CAFF-COD 50-325-40-30 MG PO CAPS: ORAL_CAPSULE | ORAL | 0 refills | Status: DC

## 2017-07-25 MED ORDER — CYCLOBENZAPRINE HCL 5 MG PO TABS: 5.0000 mg | ORAL_TABLET | Freq: Three times a day (TID) | ORAL | 2 refills | Status: DC | PRN

## 2017-07-25 MED ORDER — LOSARTAN POTASSIUM 25 MG PO TABS: 25.0000 mg | ORAL_TABLET | Freq: Every day | ORAL | 1 refills | Status: DC

## 2017-07-25 NOTE — Progress Notes (Signed)
Patient ID: Sharon Chan, female    DOB: 04/25/1976, 41 y.o.   MRN: [id number]  PCP: Sharon Chan, Sharon Timko Chan, Sharon Chan  Chief Complaint  Patient presents with  . Follow-up    3 MONTH    Subjective:  HPI Sharon Chan is a 41 y.o. female, with hx of CVA, hypertension, PCOS, and Type 2 diabetes,benign thyroid nodule,  presents for a routine follow-up. Sharon Chan is nearing the end of neuro rehabilitation which she has attended consistently since suffering a CVA 03/02/2017. Today she reports some improvement with gait and lower extremity weakness. Continues to experience headaches almost daily since CVA. Headaches are currently management with  Sharon Chan has managed headache pain with tramadol and or Fioricet, although reports only moderate relief. She reports good blood pressure and adherence/tolerance to current medication regimen. Shiquita continues to have some difficulty swallowing and has had two negative swallowing studies. She is now exercising routinely weekly, and has experienced some mild weight loss. Current Body mass index is 36.58 kg/m. Reports improvement of mood as she has started taking on-line college courses and getting out of the house and engaging in social activities. Denies depression symptoms today.  Social History   Social History  . Marital status: Married    Spouse name: N/A  . Number of children: 0  . Years of education: N/A   Occupational History  . Not on file.   Social History Main Topics  . Smoking status: Never Smoker  . Smokeless tobacco: Never Used  . Alcohol use No  . Drug use: Unknown  . Sexual activity: Not on file   Other Topics Concern  . Not on file   Social History Narrative   Lives at home, mom currently staying with her   Left-handed   Caffeine: occasional decaf coffee or raspberry tea    Family History  Problem Relation Age of Onset  . Heart attack Mother   . Post-traumatic stress disorder Father        committed suicide  .  Diabetes Father   . Hypertension Sister   . Thyroid cancer Paternal Grandmother    Review of Systems  See HPI   Patient Active Problem List   Diagnosis Date Noted  . Hemiparesis affecting right side as late effect of cerebrovascular accident (HCC) 03/23/2017  . Lacunar infarct, acute (HCC) 03/23/2017  . Visual disturbance as complication of stroke 03/07/2017  . Adjustment disorder with anxious mood   . 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 Sickle cell trait (HCC)   . Hyperlipidemia   . Thyroid nodule   . Changes in vision   . Acute ischemic stroke (HCC)   . Dysphagia, post-stroke   . Benign essential HTN   . Polycystic kidney disease   . Prediabetes   . Acute intractable headache   . Leukocytosis   . Stroke (cerebrum) (HCC) 02/25/2017    No Known Allergies  Prior to Admission medications   Medication Sig Start Date End Date Taking? Authorizing Provider  acetaminophen (TYLENOL) 325 MG tablet Take 1-2 tablets (325-650 mg total) by mouth every 4 (four) hours as needed for mild pain. 03/09/17  Yes Love, Evlyn Kanneramela S, PA-C  aspirin 325 MG tablet Take 1 tablet (325 mg total) by mouth daily. 03/10/17  Yes Love, Evlyn KannerPamela S, PA-C  atorvastatin (LIPITOR) 40 MG tablet TAKE 1 TABLET (40 MG TOTAL) BY MOUTH DAILY AT 6 PM. 06/20/17  Yes Sharon Chan, Sharon Casamento Chan, Sharon Chan  butalbital-acetaminophen-caffeine (FIORICET WITH  CODEINE) 50-325-40-30 MG capsule TAKE ONE CAPSULE EVERY 4 HOURS AS NEEDED FOR HEADACHE 05/25/17  Yes Sharon Neighbors, Sharon Chan  gabapentin (NEURONTIN) 300 MG capsule Take 1 tablet in the morning and 2 tablets at bedtime daily 03/17/17  Yes Sharon Neighbors, Sharon Chan  lisinopril (PRINIVIL,ZESTRIL) 10 MG tablet Take 1 tablet (10 mg total) by mouth daily. 03/17/17  Yes Sharon Neighbors, Sharon Chan  MAGNESIUM PO Take 1 tablet by mouth at bedtime.   Yes [provider]  metFORMIN (GLUCOPHAGE) 500 MG tablet Take 1 tablet (500 mg total) by mouth 2 (two) times daily with a  meal. 03/17/17  Yes Sharon Neighbors, Sharon Chan  naphazoline-glycerin (CLEAR EYES) 0.012-0.2 % SOLN Place 1-2 drops into both eyes 4 (four) times daily -  with meals and at bedtime. 03/10/17  Yes Love, Evlyn Kanner, PA-C  senna-docusate (SENOKOT-Chan) 8.6-50 MG tablet Take 2 tablets by mouth at bedtime. 03/10/17  Yes Love, Evlyn Kanner, PA-C  topiramate (TOPAMAX) 100 MG tablet Take 1 tablet (100 mg total) by mouth 2 (two) times daily. Take  in am and  at night 03/23/17 03/23/18 Yes Micki Riley, MD  vitamin B-12 (CYANOCOBALAMIN) 100 MCG tablet TAKE 1 TABLET BY MOUTH EVERY DAY 05/23/17  Yes Sharon Neighbors, Sharon Chan  cyclobenzaprine (FLEXERIL) 5 MG tablet Take 1 tablet (5 mg total) by mouth 3 (three) times daily as needed for muscle spasms. Shoulder/neck pain Patient not taking: Reported on 07/25/2017 03/10/17   Love, Evlyn Kanner, PA-C  traMADol (ULTRAM) 50 MG tablet Take 1-2 tablets (50-100 mg total) by mouth every 6 (six) hours as needed for moderate pain. Patient not taking: Reported on 07/25/2017 03/10/17   Love, Evlyn Kanner, PA-C    Past Medical, Surgical Family and Social History reviewed and updated.    Objective:   Vitals:   07/25/17 1016  BP: (!) 138/96  Pulse: 92  Resp: 14  Temp: 98.6 F (37 C)  SpO2: 100%     Wt Readings from Last 3 Encounters:  07/25/17 200 lb (90.7 kg)  05/24/17 202 lb (91.6 kg)  04/14/17 210 lb (95.3 kg)   Physical Exam  Constitutional: She is oriented to person, place, and time. She appears well-developed and well-nourished.  HENT:  Head: Normocephalic and atraumatic.  Eyes: Pupils are equal, round, and reactive to light. Conjunctivae are normal.  Neck: Normal range of motion. Neck supple.  Cardiovascular: Normal rate, regular rhythm, normal heart sounds and intact distal pulses.   Pulmonary/Chest: Effort normal and breath sounds normal.  Musculoskeletal: Normal range of motion.  Neurological: She is alert and oriented to person, place, and time. Coordination  abnormal.  5/5 bilateral hand grips  Cerebellar function intact Gait mildly unstable, right leg lag  Skin: Skin is warm and dry.  Psychiatric: She has a normal mood and affect. Her behavior is normal. Judgment and thought content normal.   Assessment & Plan:  1. Type 2 diabetes mellitus without complication, without long-term current use of insulin (HCC)- POCT glycosylated hemoglobin (Hb A1C)-5.8 improved today , continue metformin. 2. History of CVA with residual deficit, request neurology care at [facility] neurology. Referral placed. 3. Essential hypertension, controlled, continue therapy. 4. Other complicated headache syndrome, will trial Gabapentin 300 mg and Cyclobenzaprine 5 mg for headache management. Would appreciate additional input from neurology regarding management options for on-going chronicity of patient'Chan headaches.  RTC: 6 months chronic disease management    Godfrey Pick. Tiburcio Pea, MSN, Sharon Chan-C [facility]  [phone]  [address] [place], [place] [address] [phone]

## 2017-07-26 LAB — LIPID PANEL
CHOLESTEROL: 141 mg/dL (ref <200)
HDL: 52 mg/dL (ref >50)
LDL Cholesterol (Calc): 70 mg/dL (calc)
Non-HDL Cholesterol (Calc): 89 mg/dL (calc) (ref <130)
TRIGLYCERIDES: 104 mg/dL (ref <150)
Total CHOL/HDL Ratio: 2.7 (calc) (ref <5.0)

## 2017-07-26 LAB — EXTRA LAV TOP TUBE

## 2017-07-29 ENCOUNTER — Other Ambulatory Visit: Payer: Self-pay | Admitting: Family Medicine

## 2017-07-31 ENCOUNTER — Encounter: Payer: Medicare Other | Admitting: Speech Pathology

## 2017-07-31 ENCOUNTER — Other Ambulatory Visit: Payer: Self-pay | Admitting: Family Medicine

## 2017-08-01 ENCOUNTER — Encounter: Payer: Medicare Other | Attending: Physical Medicine & Rehabilitation

## 2017-08-01 ENCOUNTER — Ambulatory Visit: Payer: Medicare Other | Admitting: Physical Medicine & Rehabilitation

## 2017-08-01 DIAGNOSIS — I69328 Other speech and language deficits following cerebral infarction: Secondary | ICD-10-CM | POA: Insufficient documentation

## 2017-08-01 DIAGNOSIS — R1310 Dysphagia, unspecified: Secondary | ICD-10-CM | POA: Insufficient documentation

## 2017-08-01 DIAGNOSIS — I69391 Dysphagia following cerebral infarction: Secondary | ICD-10-CM | POA: Insufficient documentation

## 2017-08-01 DIAGNOSIS — I69398 Other sequelae of cerebral infarction: Secondary | ICD-10-CM | POA: Insufficient documentation

## 2017-08-01 DIAGNOSIS — I69318 Other symptoms and signs involving cognitive functions following cerebral infarction: Secondary | ICD-10-CM | POA: Insufficient documentation

## 2017-08-01 DIAGNOSIS — I69320 Aphasia following cerebral infarction: Secondary | ICD-10-CM | POA: Insufficient documentation

## 2017-08-01 DIAGNOSIS — D571 Sickle-cell disease without crisis: Secondary | ICD-10-CM | POA: Insufficient documentation

## 2017-08-01 DIAGNOSIS — R569 Unspecified convulsions: Secondary | ICD-10-CM | POA: Insufficient documentation

## 2017-08-02 ENCOUNTER — Telehealth: Payer: Self-pay | Admitting: Family Medicine

## 2017-08-02 ENCOUNTER — Encounter: Payer: Self-pay | Admitting: Neurology

## 2017-08-02 DIAGNOSIS — I69319 Unspecified symptoms and signs involving cognitive functions following cerebral infarction: Secondary | ICD-10-CM

## 2017-08-02 NOTE — Telephone Encounter (Signed)
Sharon Chan,  This patient would like to change her neurology care to [facility], Dr. Patrcia Dolly for CVA symptom management. I am placing a new referral for neurology.  Godfrey Pick. Tiburcio Pea, MSN, FNP-C [facility]  [address] [place], [place] [address] [phone]

## 2017-08-04 ENCOUNTER — Other Ambulatory Visit: Payer: Self-pay | Admitting: Family Medicine

## 2017-08-31 ENCOUNTER — Other Ambulatory Visit: Payer: Self-pay | Admitting: Neurology

## 2017-08-31 ENCOUNTER — Other Ambulatory Visit: Payer: Self-pay | Admitting: Family Medicine

## 2017-08-31 DIAGNOSIS — I69351 Hemiplegia and hemiparesis following cerebral infarction affecting right dominant side: Secondary | ICD-10-CM

## 2017-08-31 DIAGNOSIS — I6381 Other cerebral infarction due to occlusion or stenosis of small artery: Secondary | ICD-10-CM

## 2017-08-31 NOTE — Telephone Encounter (Signed)
Patient notified and scheduled 

## 2017-08-31 NOTE — Telephone Encounter (Signed)
Contact patient to advise that we need to check B-12 level as she has been taking B-12 without a recent level being evaluated. I will place an order to have this level checked. Please schedule her for an office visit.   Godfrey PickKimberly S. Tiburcio PeaHarris, MSN, FNP-C [facility]  [address] [place], [place] [address] [phone]

## 2017-09-01 ENCOUNTER — Other Ambulatory Visit: Payer: Self-pay | Admitting: Family Medicine

## 2017-09-12 ENCOUNTER — Ambulatory Visit (INDEPENDENT_AMBULATORY_CARE_PROVIDER_SITE_OTHER): Payer: Medicare Other | Admitting: Family Medicine

## 2017-09-12 ENCOUNTER — Encounter: Payer: Self-pay | Admitting: Family Medicine

## 2017-09-12 VITALS — BP 143/92 | HR 90 | Temp 97.6°F | Resp 14 | Ht 62.0 in | Wt 208.0 lb

## 2017-09-12 DIAGNOSIS — I639 Cerebral infarction, unspecified: Secondary | ICD-10-CM | POA: Diagnosis not present

## 2017-09-12 DIAGNOSIS — R42 Dizziness and giddiness: Secondary | ICD-10-CM

## 2017-09-12 DIAGNOSIS — R5383 Other fatigue: Secondary | ICD-10-CM

## 2017-09-12 DIAGNOSIS — R51 Headache: Secondary | ICD-10-CM | POA: Diagnosis not present

## 2017-09-12 DIAGNOSIS — Z23 Encounter for immunization: Secondary | ICD-10-CM | POA: Diagnosis not present

## 2017-09-12 DIAGNOSIS — W19XXXA Unspecified fall, initial encounter: Secondary | ICD-10-CM | POA: Diagnosis not present

## 2017-09-12 DIAGNOSIS — E538 Deficiency of other specified B group vitamins: Secondary | ICD-10-CM | POA: Diagnosis not present

## 2017-09-12 DIAGNOSIS — R413 Other amnesia: Secondary | ICD-10-CM

## 2017-09-12 DIAGNOSIS — R519 Headache, unspecified: Secondary | ICD-10-CM

## 2017-09-12 LAB — CBC WITH DIFFERENTIAL/PLATELET
BASOS ABS: 69 {cells}/uL (ref 0–200)
Basophils Relative: 1.3 %
EOS PCT: 2.5 %
Eosinophils Absolute: 133 cells/uL (ref 15–500)
HCT: 37.7 % (ref 35.0–45.0)
Hemoglobin: 12.3 g/dL (ref 11.7–15.5)
Lymphs Abs: 2237 cells/uL (ref 850–3900)
MCH: 25.1 pg — ABNORMAL LOW (ref 27.0–33.0)
MCHC: 32.6 g/dL (ref 32.0–36.0)
MCV: 76.9 fL — ABNORMAL LOW (ref 80.0–100.0)
MONOS PCT: 5.7 %
MPV: 9.2 fL (ref 7.5–12.5)
NEUTROS ABS: 2560 {cells}/uL (ref 1500–7800)
NEUTROS PCT: 48.3 %
PLATELETS: 308 10*3/uL (ref 140–400)
RBC: 4.9 10*6/uL (ref 3.80–5.10)
RDW: 14.7 % (ref 11.0–15.0)
TOTAL LYMPHOCYTE: 42.2 %
WBC mixed population: 302 cells/uL (ref 200–950)
WBC: 5.3 10*3/uL (ref 3.8–10.8)

## 2017-09-12 LAB — FOLATE: FOLATE: 16.5 ng/mL

## 2017-09-12 LAB — BASIC METABOLIC PANEL
BUN: 13 mg/dL (ref 7–25)
CALCIUM: 9 mg/dL (ref 8.6–10.2)
CO2: 23 mmol/L (ref 20–32)
Chloride: 110 mmol/L (ref 98–110)
Creat: 0.92 mg/dL (ref 0.50–1.10)
GLUCOSE: 101 mg/dL — AB (ref 65–99)
Potassium: 4.4 mmol/L (ref 3.5–5.3)
Sodium: 142 mmol/L (ref 135–146)

## 2017-09-12 LAB — VITAMIN B12: VITAMIN B 12: 858 pg/mL (ref 200–1100)

## 2017-09-12 MED ORDER — TRAMADOL HCL 50 MG PO TABS: 50.0000 mg | ORAL_TABLET | Freq: Four times a day (QID) | ORAL | 0 refills | Status: DC | PRN

## 2017-09-12 NOTE — Progress Notes (Signed)
Patient ID: Sharon Chan, female    DOB: 10-Dec-1975, 41 y.o.   MRN: [id number]  PCP: Bing Neighbors, FNP  No chief complaint on file.   Subjective:  HPI Sharon Chan is a 41 y.o. female presents for evaluation of more fatigue. Significant medical problems include CVA, Hypertension, multinodular thyroid  and type 2 diabetes. Sharon Chan presents today to have her B12 level checked. She reports recent gait imbalances resulting in multiple falls over the course of the last month. She recently complete stroke rehabilitation including physical and speech therapy however, reports recent worsening of imbalance issues.  She is concerned that her B12 level is low as it's been deficient in the past. Reports associated dizziness, which has been constant post CVA, however she reports dizziness has increased and it not associated with motion or movements. Headaches are still daily. She has a neurology appointment scheduled in December with Dr. Karel Jarvis. Social History   Social History  . Marital status: Married    Spouse name: N/A  . Number of children: 0  . Years of education: N/A   Occupational History  . Not on file.   Social History Main Topics  . Smoking status: Never Smoker  . Smokeless tobacco: Never Used  . Alcohol use No  . Drug use: Unknown  . Sexual activity: Not on file   Other Topics Concern  . Not on file   Social History Narrative   Lives at home, mom currently staying with her   Left-handed   Caffeine: occasional decaf coffee or raspberry tea    Family History  Problem Relation Age of Onset  . Heart attack Mother   . Post-traumatic stress disorder Father        committed suicide  . Diabetes Father   . Hypertension Sister   . Thyroid cancer Paternal Grandmother    Review of Systems  See HPI   Patient Active Problem List   Diagnosis Date Noted  . Hemiparesis affecting right side as late effect of cerebrovascular accident (HCC) 03/23/2017  . Lacunar  infarct, acute 03/23/2017  . Visual disturbance as complication of stroke 03/07/2017  . Adjustment disorder with anxious mood   . 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 Sickle cell trait (HCC)   . Hyperlipidemia   . Thyroid nodule   . Changes in vision   . Acute ischemic stroke (HCC)   . Dysphagia, post-stroke   . Benign essential HTN   . Polycystic kidney disease   . Prediabetes   . Acute intractable headache   . Leukocytosis   . Stroke (cerebrum) (HCC) 02/25/2017    No Known Allergies  Prior to Admission medications   Medication Sig Start Date End Date Taking? Authorizing Provider  acetaminophen (TYLENOL) 325 MG tablet Take 1-2 tablets (325-650 mg total) by mouth every 4 (four) hours as needed for mild pain. 03/09/17  Yes Love, Evlyn Kanner, PA-C  aspirin 325 MG tablet Take 1 tablet (325 mg total) by mouth daily. 03/10/17  Yes Love, Evlyn Kanner, PA-C  atorvastatin (LIPITOR) 40 MG tablet TAKE 1 TABLET (40 MG TOTAL) BY MOUTH DAILY AT 6 PM. 09/01/17  Yes Bing Neighbors, FNP  butalbital-acetaminophen-caffeine (FIORICET WITH CODEINE) [phone] MG capsule TAKE ONE CAPSULE EVERY 4 HOURS AS NEEDED FOR HEADACHE 07/25/17  Yes Bing Neighbors, FNP  cyclobenzaprine (FLEXERIL) 5 MG tablet Take 1 tablet (5 mg total) by mouth 3 (three) times daily as needed for muscle spasms.  Shoulder/neck pain 07/25/17  Yes Bing NeighborsHarris, Kenlee Vogt S, FNP  gabapentin (NEURONTIN) 300 MG capsule TAKE 1 TABLET IN THE MORNING AND 2 TABLETS AT BEDTIME DAILY 07/31/17  Yes Bing NeighborsHarris, Pharaoh Pio S, FNP  losartan (COZAAR) 25 MG tablet Take 1 tablet (25 mg total) by mouth daily. 07/25/17  Yes Bing NeighborsHarris, Kessler Kopinski S, FNP  MAGNESIUM PO Take 1 tablet by mouth at bedtime.   Yes [provider]  metFORMIN (GLUCOPHAGE) 500 MG tablet Take 1 tablet (500 mg total) by mouth 2 (two) times daily with a meal. 03/17/17  Yes Bing NeighborsHarris, Iysha Mishkin S, FNP  naphazoline-glycerin (CLEAR EYES) 0.012-0.2 % SOLN Place 1-2 drops  into both eyes 4 (four) times daily -  with meals and at bedtime. 03/10/17  Yes Love, Evlyn KannerPamela S, PA-C  senna-docusate (SENOKOT-S) 8.6-50 MG tablet Take 2 tablets by mouth at bedtime. 03/10/17  Yes Love, Evlyn KannerPamela S, PA-C  topiramate (TOPAMAX) 100 MG tablet Take 1 tablet (100 mg total) by mouth 2 (two) times daily. Take 100mg  in am and 200mg  at night 03/23/17 03/23/18 Yes Micki RileySethi, Pramod S, MD  vitamin B-12 (CYANOCOBALAMIN) 100 MCG tablet TAKE 1 TABLET BY MOUTH EVERY DAY 08/04/17  Yes Bing NeighborsHarris, Loralei Radcliffe S, FNP  traMADol (ULTRAM) 50 MG tablet Take 1-2 tablets (50-100 mg total) by mouth every 6 (six) hours as needed for moderate pain. Patient not taking: Reported on 09/12/2017 03/10/17   Love, Evlyn KannerPamela S, PA-C    Past Medical, Surgical Family and Social History reviewed and updated.    Objective:   Today's Vitals   09/12/17 1037  BP: (!) 143/92  Pulse: 90  Resp: 14  Temp: 97.6 F (36.4 C)  TempSrc: Oral  SpO2: 100%  Weight: 208 lb (94.3 kg)  Height: 5\' 2"  (1.575 m)    Wt Readings from Last 3 Encounters:  09/12/17 208 lb (94.3 kg)  07/25/17 200 lb (90.7 kg)  05/24/17 202 lb (91.6 kg)    Physical Exam  Constitutional: She is oriented to person, place, and time. She appears well-developed and well-nourished.  HENT:  Head: Normocephalic and atraumatic.  Eyes: Conjunctivae and EOM are normal. Pupils are equal, round, and reactive to light.  Neck: Normal range of motion. Neck supple.  Cardiovascular: Normal rate, regular rhythm, normal heart sounds and intact distal pulses.  Pulmonary/Chest: Effort normal and breath sounds normal.  Abdominal: Soft. Bowel sounds are normal.  Musculoskeletal: Normal range of motion.  Neurological: She is alert and oriented to person, place, and time. Coordination abnormal. GCS eye subscore is 4. GCS verbal subscore is 5. GCS motor subscore is 6.  Chronic right sided hemiparesis   Psychiatric: She has a normal mood and affect. Her behavior is normal. Judgment and  thought content normal.   Assessment & Plan:  1. Fatigue, unspecified type, checking B12 , folate, and CBC. 2. Dizziness- negative for orthostatic hypotension. Checking CBC. 3. B12 deficiency, checking a B12 level. 4. Fall, initial encounter, recommended resuming PT. Patient declines at present.  5. Memory impairment, checking a folate level. Continue follow-up with neurology. Patient is in school and encouraged to allow extra time to complete assignments.  6. Need for immunization against influenza- Flu Vaccine QUAD 36+ mos IM 7. Frequent Headaches-refilling tramadol for headache related pain.  Continue Gabapentin Topamax. Deferring further treatment and management to neurology.   Orders Placed This Encounter  Procedures  . Flu Vaccine QUAD 36+ mos IM  . Vitamin B12  . Folate  . CBC with Differential  . Basic metabolic panel  Meds ordered this encounter  Medications  . traMADol (ULTRAM) 50 MG tablet    Sig: Take 1-2 tablets (50-100 mg total) by mouth every 6 (six) hours as needed for moderate pain.    Dispense:  60 tablet    Refill:  0    Order Specific Question:   Supervising Provider    Answer:   Quentin Angst [[id number]]    Return in about 4 months (around 01/22/2018) for Complete Physical Exam.   Godfrey Pick. Tiburcio Pea, MSN, FNP-C The [facility]  [address] [place], [place] [address] [phone]

## 2017-09-22 ENCOUNTER — Other Ambulatory Visit: Payer: Self-pay | Admitting: Family Medicine

## 2017-09-24 ENCOUNTER — Telehealth: Payer: Self-pay | Admitting: Family Medicine

## 2017-09-24 NOTE — Telephone Encounter (Signed)
Please contact [name] to advise that her most recent lab results for her B12 and folate were in normal range.  She no longer needs to take supplemental B12.  As for her fatigue, dizziness, and headaches, she has a follow-up with neurology on 10/27/2017 I will defer management of these residual symptoms following her CVA to neurology.  She should continue the tramadol as needed for headaches.  If headaches worsen in intensity and is accompanied by worsening dizziness or visual disturbance that she should go immediately to the ED.  Godfrey PickKimberly S. Tiburcio PeaHarris, MSN, FNP-C [facility]  [address] [place], [place] [address] [phone]

## 2017-09-25 ENCOUNTER — Other Ambulatory Visit: Payer: Self-pay | Admitting: Family Medicine

## 2017-09-25 MED ORDER — ASPIRIN EC 325 MG PO TBEC: 325.0000 mg | DELAYED_RELEASE_TABLET | Freq: Every day | ORAL | 1 refills | Status: AC

## 2017-09-25 NOTE — Telephone Encounter (Signed)
Patient wants to know if she needs to still take aspirin. Patient needs a refill

## 2017-09-25 NOTE — Progress Notes (Signed)
VM left that aspirin was sent to pharmacy

## 2017-09-25 NOTE — Progress Notes (Signed)
Aspirin refilled.

## 2017-10-14 ENCOUNTER — Other Ambulatory Visit: Payer: Self-pay | Admitting: Endocrinology

## 2017-10-17 ENCOUNTER — Other Ambulatory Visit: Payer: Self-pay | Admitting: Endocrinology

## 2017-10-17 DIAGNOSIS — E041 Nontoxic single thyroid nodule: Secondary | ICD-10-CM

## 2017-10-19 ENCOUNTER — Telehealth: Payer: Self-pay | Admitting: Endocrinology

## 2017-10-19 ENCOUNTER — Other Ambulatory Visit: Payer: Medicare Other

## 2017-10-19 ENCOUNTER — Other Ambulatory Visit: Payer: Self-pay | Admitting: Endocrinology

## 2017-10-19 DIAGNOSIS — E041 Nontoxic single thyroid nodule: Secondary | ICD-10-CM

## 2017-10-19 NOTE — Telephone Encounter (Signed)
Pt is scheduled for Jan 18 and 22

## 2017-10-19 NOTE — Telephone Encounter (Signed)
-----   Message from Reather LittlerAjay Kumar, MD sent at 10/14/2017  3:41 PM EST ----- Regarding: f/u She needs f/u with labs in 1/19. When scheduled let me know I have to order ultrasound thanks

## 2017-10-27 ENCOUNTER — Other Ambulatory Visit: Payer: Medicare Other

## 2017-10-27 ENCOUNTER — Ambulatory Visit (INDEPENDENT_AMBULATORY_CARE_PROVIDER_SITE_OTHER): Payer: Medicare Other | Admitting: Neurology

## 2017-10-27 ENCOUNTER — Encounter: Payer: Self-pay | Admitting: Neurology

## 2017-10-27 VITALS — BP 140/90 | HR 86 | Ht 63.0 in | Wt 218.0 lb

## 2017-10-27 DIAGNOSIS — R42 Dizziness and giddiness: Secondary | ICD-10-CM

## 2017-10-27 DIAGNOSIS — R519 Headache, unspecified: Secondary | ICD-10-CM

## 2017-10-27 DIAGNOSIS — I69351 Hemiplegia and hemiparesis following cerebral infarction affecting right dominant side: Secondary | ICD-10-CM | POA: Diagnosis not present

## 2017-10-27 DIAGNOSIS — R51 Headache: Secondary | ICD-10-CM | POA: Diagnosis not present

## 2017-10-27 DIAGNOSIS — I639 Cerebral infarction, unspecified: Secondary | ICD-10-CM

## 2017-10-27 MED ORDER — DULOXETINE HCL 30 MG PO CPEP: ORAL_CAPSULE | ORAL | 4 refills | Status: DC

## 2017-10-27 NOTE — Progress Notes (Signed)
NEUROLOGY CONSULTATION NOTE  Sharon Chan MRN: [id number] DOB: 02-Jul-1976  Referring provider: Joaquin CourtsKimberly Harris, FNP Primary care provider: Joaquin CourtsKimberly Harris, FNP  Reason for consult:  Dizziness post-stroke  Thank you for your kind referral of Sharon Chan for consultation of the above symptoms. Although her history is well known to you, please allow me to reiterate it for the purpose of our medical record. Records and images were personally reviewed where available.   HISTORY OF PRESENT ILLNESS: This is a 41 year old left-handed woman with a history of stroke in April 2018, presenting for evaluation of headaches, dizziness, and memory changes. Records on Epic were reviewed. In April 2018, she started having right leg numbness causing her leg to buckle. She woke up the next day and noticed mild right face and hand numbnes, that started to progress. BP was elevated in the ER. She had no prior history of hypertension. CT showed a left thalamic hypodensity that was confirmed as an acute infarct on follow-up MRI brain. Her CTA head had shown multiple intracranial stenosis (mild right P1 and P2, moderate to severe left P1 and P2, irregularity and stenosis, moderate irregularity and stenosis at right M1 origin, moderate to severe irregularity and stenosis at mid-M1). CTA neck normal. TTE was normal. LDL was elevated at 135. UDS negative. Hemoglobin A1c 6.3. Bloodwork for lupus, sarcoidosis, Lyme, and syphilis were normal. HIV antibody was negative. She had a hypercoagulable panel that was normal, sickle cell trait was positive. She had subjective right-sided weakness and pain and was given pain medications. She complained of headaches and was started on Topamax. She was last seen by neurologist Dr. Pearlean BrownieSethi in May 2018. She continues to report constant daily headaches with throbbing pain in the back of her head, mostly on the left side. Pain waxes and wanes from 5-6/10 to 10/10. This is  associated with nausea and light sensitivity. She has dizziness when the pain is worse, like things are spinning or swelling. When she closes her left eye, dizziness is not as bad. Symptoms are worse when standing, so she just lies down. She has been taking Fioricet BID since April. She initially reported taking Tramadol TID since April, but after we discussed medication overuse headaches, she corrected herself and states she has not been taking it anymore at all. She also takes Tylenol every night and Flexeril. She feels better if she keeps her head bent down. She has neck and shoulder pain. She continues to have right-sided numbness since the stroke. She has noticed occasional drooling, she cannot feel her lips on the left side. No difficulty swallowing. She has also been falling a lot, reporting her right arm and leg are weak. She has been taking Gabapentin 300mg  in AM, 600mg  in PM for head and neck pain since April, as well as Topamax 100mg  BID (higher dose cause dry mouth). She has had poor sleep since June, usually getting only 3 hours of sleep, she is in a lot of pain at night. She reports problems with pain in her legs and back prior to the stroke, attributed to sickle cell trait. She was previously on Oxycontin/Oxycodone but her prescription ran out. She is tearful, worried she would have another stroke. When she takes the Topamax, numbness worsens, which scares her. Some days she is depressed, other times she is fine. She sees a Veterinary surgeoncounselor every week and "handles it okay."  She has always been an introvert, but the stroke made her even more so. She lives  alone. She has noticed memory issues, she would be mid-sentence and forget what she was saying. She denies any missed bills but has trouble writing out numbers sometimes or signing her name in the right order. She occasionally forgets her medications and has alarms set up. She does not drive.    PAST MEDICAL HISTORY: Past Medical History:  Diagnosis  Date  . CVA (cerebral vascular accident) (HCC)   . Polycystic disease, ovaries   . Seizures (HCC)   . Sickle cell trait (HCC)     PAST SURGICAL HISTORY: Past Surgical History:  Procedure Laterality Date  . LAPAROSCOPIC CHOLECYSTECTOMY      MEDICATIONS: Current Outpatient Medications on File Prior to Visit  Medication Sig Dispense Refill  . acetaminophen (TYLENOL) 325 MG tablet Take 1-2 tablets (325-650 mg total) by mouth every 4 (four) hours as needed for mild pain.    Marland Kitchen aspirin 325 MG tablet Take 1 tablet (325 mg total) by mouth daily. 100 tablet 1  . aspirin EC 325 MG tablet Take 1 tablet (325 mg total) daily by mouth. 360 tablet 1  . atorvastatin (LIPITOR) 40 MG tablet TAKE 1 TABLET (40 MG TOTAL) BY MOUTH DAILY AT 6 PM. 30 tablet 0  . butalbital-acetaminophen-caffeine (FIORICET WITH CODEINE) 50-325-40-30 MG capsule TAKE ONE CAPSULE EVERY 4 HOURS AS NEEDED FOR HEADACHE 60 capsule 0  . cyclobenzaprine (FLEXERIL) 5 MG tablet Take 1 tablet (5 mg total) by mouth 3 (three) times daily as needed for muscle spasms. Shoulder/neck pain 90 tablet 2  . gabapentin (NEURONTIN) 300 MG capsule TAKE 1 TABLET IN THE MORNING AND 2 TABLETS AT BEDTIME DAILY 90 capsule 3  . losartan (COZAAR) 25 MG tablet Take 1 tablet (25 mg total) by mouth daily. 90 tablet 1  . MAGNESIUM PO Take 1 tablet by mouth at bedtime.    . metFORMIN (GLUCOPHAGE) 500 MG tablet Take 1 tablet (500 mg total) by mouth 2 (two) times daily with a meal. 180 tablet 3  . naphazoline-glycerin (CLEAR EYES) 0.012-0.2 % SOLN Place 1-2 drops into both eyes 4 (four) times daily -  with meals and at bedtime.  0  . senna-docusate (SENOKOT-S) 8.6-50 MG tablet Take 2 tablets by mouth at bedtime. 60 tablet 0  . topiramate (TOPAMAX) 100 MG tablet Take 1 tablet (100 mg total) by mouth 2 (two) times daily. Take 100mg  in am and 200mg  at night 90 tablet 3  . traMADol (ULTRAM) 50 MG tablet Take 1-2 tablets (50-100 mg total) by mouth every 6 (six) hours as  needed for moderate pain. 60 tablet 0  . vitamin B-12 (CYANOCOBALAMIN) 100 MCG tablet TAKE 1 TABLET BY MOUTH EVERY DAY 30 tablet 0   No current facility-administered medications on file prior to visit.     ALLERGIES: No Known Allergies  FAMILY HISTORY: Family History  Problem Relation Age of Onset  . Heart attack Mother   . Post-traumatic stress disorder Father        committed suicide  . Diabetes Father   . Hypertension Sister   . Thyroid cancer Paternal Grandmother     SOCIAL HISTORY: Social History   Socioeconomic History  . Marital status: Married    Spouse name: Not on file  . Number of children: 0  . Years of education: Not on file  . Highest education level: Not on file  Social Needs  . Financial resource strain: Not on file  . Food insecurity - worry: Not on file  . Food insecurity - inability:  Not on file  . Transportation needs - medical: Not on file  . Transportation needs - non-medical: Not on file  Occupational History  . Not on file  Tobacco Use  . Smoking status: Never Smoker  . Smokeless tobacco: Never Used  Substance and Sexual Activity  . Alcohol use: No  . Drug use: Not on file  . Sexual activity: Not on file  Other Topics Concern  . Not on file  Social History Narrative   Lives at home, mom currently staying with her   Left-handed   Caffeine: occasional decaf coffee or raspberry tea    REVIEW OF SYSTEMS: Constitutional: No fevers, chills, or sweats, no generalized fatigue, change in appetite Eyes: No visual changes, double vision, eye pain Ear, nose and throat: No hearing loss, ear pain, nasal congestion, sore throat Cardiovascular: No chest pain, palpitations Respiratory:  No shortness of breath at rest or with exertion, wheezes GastrointestinaI: No nausea, vomiting, diarrhea, abdominal pain, fecal incontinence Genitourinary:  No dysuria, urinary retention or frequency Musculoskeletal:  + neck pain, back pain Integumentary: No rash,  pruritus, skin lesions Neurological: as above Psychiatric: + depression, insomnia, anxiety Endocrine: No palpitations, fatigue, diaphoresis, mood swings, change in appetite, change in weight, increased thirst Hematologic/Lymphatic:  No anemia, purpura, petechiae. Allergic/Immunologic: no itchy/runny eyes, nasal congestion, recent allergic reactions, rashes  PHYSICAL EXAM: Vitals:   10/27/17 1021  BP: 140/90  Pulse: 86  SpO2: 99%   General: No acute distress, flat affect, became tearful during the visit Head:  Normocephalic/atraumatic Eyes: Fundoscopic exam shows bilateral sharp discs, no vessel changes, exudates, or hemorrhages Neck: supple, no paraspinal tenderness, full range of motion Back: No paraspinal tenderness Heart: regular rate and rhythm Lungs: Clear to auscultation bilaterally. Vascular: No carotid bruits. Skin/Extremities: No rash, no edema Neurological Exam: Mental status: alert and oriented to person, place, and time, no dysarthria or aphasia, Fund of knowledge is appropriate.  Recent and remote memory are intact. 3/3 delayed recall. Attention and concentration are normal. Able to spell WORLD backward.  Able to name objects and repeat phrases.  Cranial nerves: CN I: not tested CN II: pupils equal, round and reactive to light, visual fields intact, fundi unremarkable. CN III, IV, VI:  full range of motion, no nystagmus, no ptosis CN V: decreased cold, pin on right V1-3, split midline with tuning fork CN VII: upper and lower face symmetric CN VIII: hearing intact to finger rub CN IX, X: gag intact, uvula midline CN XI: sternocleidomastoid and trapezius muscles intact CN XII: tongue midline Bulk & Tone: normal, no fasciculations. Motor: 5/5 on right UE except for 4/5 finger flexion, 4/5 right hip flexion, otherwise 5/5 on right LE, orbits around the right arm. 5/5 on left UE and LE. No pronator drift. Sensation: decreased light touch, cold, pin on right UE and LE but  sometimes inconsistent. Romberg test negative Deep Tendon Reflexes: +2 throughout, no ankle clonus Plantar responses: downgoing bilaterally Cerebellar: no incoordination on finger to nose testing Gait: slow and cautious, no ataxia, unable to tandem walk Tremor: none  IMPRESSION: This is a 41 year old left-handed woman with a history of left thalamic stroke in April 2018. Her CTA and MRA head had shown intracranial stenosis, felt to be due to small vessel disease. She is now on BP and cholesterol medications and takes aspirin 325mg  daily. She has not had any further focal symptoms, but has residual right-sided sensorimotor deficits. The degree of weakness on her right side is not clearly explained  by a thalamic infarct, a repeat MRI brain with and without contrast will be ordered to assess for interval change. She reports Metformin is not for diabetes but for PCOS. We discussed chronic daily headaches, there is a component of medication overuse, she was instructed to start weaning Fioricet and Tylenol down to 2-3 times a week, otherwise headaches will not improve. We discussed starting a different medication for headache, start Cymbalta 30mg  qhs, side effects were discussed. Cognitive difficulties may be due to Topamax, we may wean off in the future. She will be scheduled for Neurocognitive testing to further evaluate memory complaints, I suspect there is a component of depression contributing to this as well (Cymbalta may help with this too). Continue follow-up with therapist. Continue control of vascular risk factors and daily aspirin. She knows to go to the ER for any sudden change in symptoms and will follow-up in 3 months.   Thank you for allowing me to participate in the care of this patient. Please do not hesitate to call for any questions or concerns.   Patrcia Dolly, M.D.  CC: Joaquin Courts, FNP

## 2017-10-27 NOTE — Patient Instructions (Addendum)
1. Schedule MRI brain with and without contrast  [facility] and they will call you directly to schedule your appt. They are located at [address] If you need to contact them directly please call [phone].   2. Start Cymbalta 30mg  at night 3. Schedule Neurocognitive testing with Dr. Alinda DoomsBailar 4. Continue Topamax and Gabapentin for now 5. Start weaning down the Fioricet and Tylenol to only 2-3 times a week, otherwise headaches will not improve 6. Continue follow-up with counselor 7. Continue control of blood pressure, cholesterol, as well as sugar levels 8. Follow-up in 3 months, call for any changes. For any sudden change in symptoms, go to ER immediately

## 2017-10-30 ENCOUNTER — Ambulatory Visit
Admission: RE | Admit: 2017-10-30 | Discharge: 2017-10-30 | Disposition: A | Discharge status: Home / Self Care | Payer: Medicare Other | Source: Ambulatory Visit | Attending: Endocrinology | Admitting: Endocrinology

## 2017-10-30 DIAGNOSIS — E041 Nontoxic single thyroid nodule: Secondary | ICD-10-CM

## 2017-11-04 ENCOUNTER — Other Ambulatory Visit: Payer: Self-pay | Admitting: Family Medicine

## 2017-11-04 DIAGNOSIS — I6930 Unspecified sequelae of cerebral infarction: Secondary | ICD-10-CM

## 2017-11-22 ENCOUNTER — Other Ambulatory Visit: Payer: Self-pay | Admitting: Family Medicine

## 2017-11-30 ENCOUNTER — Other Ambulatory Visit (INDEPENDENT_AMBULATORY_CARE_PROVIDER_SITE_OTHER): Payer: Medicare Other

## 2017-11-30 DIAGNOSIS — E041 Nontoxic single thyroid nodule: Secondary | ICD-10-CM

## 2017-11-30 LAB — T4, FREE: FREE T4: 0.74 ng/dL (ref 0.60–1.60)

## 2017-11-30 LAB — TSH: TSH: 0.76 u[IU]/mL (ref 0.35–4.50)

## 2017-11-30 LAB — T3, FREE: T3, Free: 3.3 pg/mL (ref 2.3–4.2)

## 2017-12-01 ENCOUNTER — Other Ambulatory Visit: Payer: Medicare Other

## 2017-12-05 ENCOUNTER — Ambulatory Visit (INDEPENDENT_AMBULATORY_CARE_PROVIDER_SITE_OTHER): Payer: Medicare Other | Admitting: Endocrinology

## 2017-12-05 ENCOUNTER — Encounter: Payer: Self-pay | Admitting: Endocrinology

## 2017-12-05 VITALS — BP 158/106 | HR 97 | Ht 63.0 in | Wt 215.0 lb

## 2017-12-05 DIAGNOSIS — E041 Nontoxic single thyroid nodule: Secondary | ICD-10-CM

## 2017-12-05 NOTE — Progress Notes (Signed)
Patient ID: Sharon Chan, female   DOB: 1976/03/09, 42 y.o.   MRN: [id number]          Referring physician: Ledell Noss FNP   Reason for Appointment: Evaluation of thyroid nodule    History of Present Illness:   The patient's thyroid nodule was first discovered incidentally when she was having a CT angiogram in April after her stroke  The patient had not had any previous knowledge of swelling in her thyroid or mass in the neck The thyroid ultrasound in 5/18 showed the following  There is a 2.2 cm left superior mixed cystic solid hypoechoic TR 3 nodule  RECENT history:  Patient had a follow-up ultrasound done to assess her thyroid nodule, this was done in 10/2017 This showed the nodule to be now 1.8 cm and no other new nodules She does not feel any pressure or choking and no difficulty swallowing now  Although she had a slightly low TSH in 6/18 it has been consistently normal subsequently now She feels fairly good    Lab Results  Component Value Date   FREET4 0.74 11/30/2017   FREET4 0.86 05/24/2017   TSH 0.76 11/30/2017   TSH 0.99 05/24/2017   TSH 0.22 (L) 04/14/2017    Allergies as of 12/05/2017      Reactions   Penicillins Anaphylaxis      Medication List        Accurate as of 12/05/17 11:14 AM. Always use your most recent med list.          acetaminophen 325 MG tablet Commonly known as:  TYLENOL Take 1-2 tablets (325-650 mg total) by mouth every 4 (four) hours as needed for mild pain.   aspirin 325 MG tablet Take 1 tablet (325 mg total) by mouth daily.   aspirin EC 325 MG tablet Take 1 tablet (325 mg total) daily by mouth.   atorvastatin 40 MG tablet Commonly known as:  LIPITOR TAKE 1 TABLET (40 MG TOTAL) BY MOUTH DAILY AT 6 PM.   butalbital-acetaminophen-caffeine 50-325-40-30 MG capsule Commonly known as:  FIORICET WITH CODEINE TAKE ONE CAPSULE EVERY 4 HOURS AS NEEDED FOR HEADACHE   cyclobenzaprine 5 MG tablet Commonly known as:   FLEXERIL TAKE 1 TABLET 3 TIMES A DAY AS NEEDED FOR MUSCLE SPASM SHOULDER/NECK PAIN   DULoxetine 30 MG capsule Commonly known as:  CYMBALTA Take 1 tablet at bedtime   gabapentin 300 MG capsule Commonly known as:  NEURONTIN TAKE 1 TABLET IN THE MORNING AND 2 TABLETS AT BEDTIME DAILY   losartan 25 MG tablet Commonly known as:  COZAAR Take 1 tablet (25 mg total) by mouth daily.   MAGNESIUM PO Take 1 tablet by mouth at bedtime.   metFORMIN 500 MG tablet Commonly known as:  GLUCOPHAGE Take 1 tablet (500 mg total) by mouth 2 (two) times daily with a meal.   naphazoline-glycerin 0.012-0.2 % Soln Commonly known as:  CLEAR EYES REDNESS Place 1-2 drops into both eyes 4 (four) times daily -  with meals and at bedtime.   senna-docusate 8.6-50 MG tablet Commonly known as:  Senokot-S Take 2 tablets by mouth at bedtime.   topiramate 100 MG tablet Commonly known as:  TOPAMAX Take 1 tablet (100 mg total) by mouth 2 (two) times daily. Take 100mg  in am and 200mg  at night   traMADol 50 MG tablet Commonly known as:  ULTRAM Take 1-2 tablets (50-100 mg total) by mouth every 6 (six) hours as needed for moderate pain.  vitamin B-12 100 MCG tablet Commonly known as:  CYANOCOBALAMIN TAKE 1 TABLET BY MOUTH EVERY DAY       Allergies:  Allergies  Allergen Reactions  . Penicillins Anaphylaxis    Past Medical History:  Diagnosis Date  . CVA (cerebral vascular accident) (HCC)   . Polycystic disease, ovaries   . Seizures (HCC)   . Sickle cell trait [facility](HCC)       Past Surgical History:  Procedure Laterality Date  . LAPAROSCOPIC CHOLECYSTECTOMY      Family History  Problem Relation Age of Onset  . Heart attack Mother   . Diabetes Mother   . Post-traumatic stress disorder Father        committed suicide  . Diabetes Father   . Hypertension Sister   . Thyroid cancer Paternal Grandmother     Social History:  reports that  has never smoked. she has never used smokeless tobacco. She  reports that she does not drink alcohol or use drugs.     Review of Systems    Blood pressure has been high, followed by PCP  BP Readings from Last 3 Encounters:  12/05/17 (!) 158/106  10/27/17 140/90  09/12/17 (!) 143/92   She has been told to have PCOS by gynecologist, has history of infrequent menstrual cycles, facial hirsutism and prediabetes with baseline A1c 6.3 Has been prescribed metformin by the PCP now  Lab Results  Component Value Date   HGBA1C 5.8 07/25/2017   HGBA1C 6.3 (H) 02/26/2017   Lab Results  Component Value Date   LDLCALC 135 (H) 02/26/2017   CREATININE 0.92 09/12/2017     Examination:   BP (!) 158/106   Pulse 97   Ht 5\' 3"  (1.6 m)   Wt 215 lb (97.5 kg)   SpO2 98%   BMI 38.09 kg/m            THYROID nodule is palpable on the left superior pole area on swallowing, about 1.5-2 cm in size and firm No other nodules  There is no lymphadenopathy in the neck  Biceps reflexes normal  Assessment/Plan:  Thyroid nodule: She has a low risk nodule which is mixed cystic/solid  This is now relatively smaller in size on follow-up ultrasound Currently can be palpated on the left side on swallowing  Since she has a low risk nodule which is slightly smaller in size and otherwise stable she does not need any further follow-up of the nodule by ultrasound Will have her come back in a year for follow-up exam  Thyroid levels of been consistently normal and do not think she needs reevaluation unless symptomatic  Reather LittlerAjay Thaniel Coluccio 12/05/2017

## 2017-12-08 ENCOUNTER — Other Ambulatory Visit: Payer: Self-pay | Admitting: Family Medicine

## 2017-12-08 DIAGNOSIS — I6930 Unspecified sequelae of cerebral infarction: Secondary | ICD-10-CM

## 2018-01-06 ENCOUNTER — Other Ambulatory Visit: Payer: Self-pay | Admitting: Family Medicine

## 2018-01-06 DIAGNOSIS — I6930 Unspecified sequelae of cerebral infarction: Secondary | ICD-10-CM

## 2018-01-21 ENCOUNTER — Other Ambulatory Visit: Payer: Self-pay | Admitting: Family Medicine

## 2018-01-22 ENCOUNTER — Ambulatory Visit: Payer: Medicare Other | Admitting: Family Medicine

## 2018-01-29 ENCOUNTER — Ambulatory Visit: Payer: Medicare Other | Admitting: Neurology

## 2018-02-04 ENCOUNTER — Other Ambulatory Visit: Payer: Self-pay | Admitting: Family Medicine

## 2018-02-04 DIAGNOSIS — I6930 Unspecified sequelae of cerebral infarction: Secondary | ICD-10-CM

## 2018-02-06 ENCOUNTER — Ambulatory Visit (INDEPENDENT_AMBULATORY_CARE_PROVIDER_SITE_OTHER): Payer: Medicare Other | Admitting: Family Medicine

## 2018-02-06 ENCOUNTER — Encounter: Payer: Self-pay | Admitting: Family Medicine

## 2018-02-06 VITALS — BP 144/80 | HR 90 | Temp 98.4°F | Resp 16 | Ht 63.0 in | Wt 234.0 lb

## 2018-02-06 DIAGNOSIS — I10 Essential (primary) hypertension: Secondary | ICD-10-CM | POA: Diagnosis not present

## 2018-02-06 DIAGNOSIS — I639 Cerebral infarction, unspecified: Secondary | ICD-10-CM

## 2018-02-06 DIAGNOSIS — Z1231 Encounter for screening mammogram for malignant neoplasm of breast: Secondary | ICD-10-CM

## 2018-02-06 DIAGNOSIS — R519 Headache, unspecified: Secondary | ICD-10-CM

## 2018-02-06 DIAGNOSIS — R82998 Other abnormal findings in urine: Secondary | ICD-10-CM | POA: Diagnosis not present

## 2018-02-06 DIAGNOSIS — F339 Major depressive disorder, recurrent, unspecified: Secondary | ICD-10-CM | POA: Diagnosis not present

## 2018-02-06 DIAGNOSIS — G4700 Insomnia, unspecified: Secondary | ICD-10-CM

## 2018-02-06 DIAGNOSIS — Z6841 Body Mass Index (BMI) 40.0 and over, adult: Secondary | ICD-10-CM | POA: Diagnosis not present

## 2018-02-06 DIAGNOSIS — R7303 Prediabetes: Secondary | ICD-10-CM | POA: Diagnosis not present

## 2018-02-06 DIAGNOSIS — R51 Headache: Secondary | ICD-10-CM | POA: Diagnosis not present

## 2018-02-06 DIAGNOSIS — Z1239 Encounter for other screening for malignant neoplasm of breast: Secondary | ICD-10-CM

## 2018-02-06 LAB — POCT URINALYSIS DIP (DEVICE)
Bilirubin Urine: NEGATIVE
Glucose, UA: NEGATIVE mg/dL
HGB URINE DIPSTICK: NEGATIVE
KETONES UR: NEGATIVE mg/dL
NITRITE: NEGATIVE
PH: 7 (ref 5.0–8.0)
PROTEIN: NEGATIVE mg/dL
Specific Gravity, Urine: 1.01 (ref 1.005–1.030)
Urobilinogen, UA: 0.2 mg/dL (ref 0.0–1.0)

## 2018-02-06 LAB — POCT GLYCOSYLATED HEMOGLOBIN (HGB A1C): Hemoglobin A1C: 6.2

## 2018-02-06 MED ORDER — ATORVASTATIN CALCIUM 40 MG PO TABS: 40.0000 mg | ORAL_TABLET | Freq: Every day | ORAL | 0 refills | Status: DC

## 2018-02-06 MED ORDER — ZOLPIDEM TARTRATE 5 MG PO TABS: 5.0000 mg | ORAL_TABLET | Freq: Every evening | ORAL | 0 refills | Status: DC | PRN

## 2018-02-06 MED ORDER — BUTALBITAL-APAP-CAFF-COD 50-325-40-30 MG PO CAPS: ORAL_CAPSULE | ORAL | 0 refills | Status: DC

## 2018-02-06 MED ORDER — BUPROPION HCL ER (XL) 150 MG PO TB24: 150.0000 mg | ORAL_TABLET | Freq: Every day | ORAL | 1 refills | Status: DC

## 2018-02-06 NOTE — Progress Notes (Signed)
Patient ID: Sharon Chan, female    DOB: 04/20/1976, 42 y.o.   MRN: [id number]  PCP: Bing NeighborsHarris, Kellen Hover S, FNP gking Chief Complaint  Patient presents with  . Follow-up  . Headache    Subjective:  HPI Sharon Shaggyiffany R Haselton is a 42 y.o. female with history of CVA, Hypertension, PCOS, thyroid nodules, presents for a follow-up of headaches, hypertension, and type 2 diabetes. Kahliyah experienced a CVA in April 2018. She has experienced chronic headaches, right sided weakness, and aphasia as post residual deficits.  Rikia is followed by Dr. Patrcia DollyKaren Aquino neurologist.  Patient reports that she continues to experience headaches occurring almost daily.  Headaches continue to be generalized and are difficult to manage at times.  She denies any associated dizziness or visual disturbances.  She has been prescribed Topamax for preventative therapy for greater than 6 months and has trialed and failed tramadol.  Of recent Cymbalta was added to her medication regimenfor management of  depression and pain. She is not tolerating Cymbalta and would lke to discontinue. She chronically takes Fioricet for acute moderate to severe headache pain.  This is been a chronic problem since her stroke in April 2018.  Naje also suffers from type 2 diabetes her last A1c was 5.8.  She continues to struggle with weight loss and today is requesting a referral to weight management for assistance with weight reduction options.  Current Body mass index is 41.45 kg/m.She reports that she eats a very balanced diet low in carbohydrates low in fat rich in vegetables and proteins.  She has participated in routine physical activity although continues to struggle with weight loss.  She suffers from PCO S which she endorses as a barrier to achieving weight loss.  She is currently prescribed metformin 500 mg once daily for type 2 diabetes and PCO S.  Serenna also suffers from hypertension reports no routine monitoring of blood pressure.   She has been negative of chest pain, shortness of breath, or lower extremity swelling. Social History   Socioeconomic History  . Marital status: Married    Spouse name: Not on file  . Number of children: 0  . Years of education: Not on file  . Highest education level: Not on file  Occupational History  . Not on file  Social Needs  . Financial resource strain: Not on file  . Food insecurity:    Worry: Not on file    Inability: Not on file  . Transportation needs:    Medical: Not on file    Non-medical: Not on file  Tobacco Use  . Smoking status: Never Smoker  . Smokeless tobacco: Never Used  Substance and Sexual Activity  . Alcohol use: No  . Drug use: No  . Sexual activity: Not on file  Lifestyle  . Physical activity:    Days per week: Not on file    Minutes per session: Not on file  . Stress: Not on file  Relationships  . Social connections:    Talks on phone: Not on file    Gets together: Not on file    Attends religious service: Not on file    Active member of club or organization: Not on file    Attends meetings of clubs or organizations: Not on file    Relationship status: Not on file  . Intimate partner violence:    Fear of current or ex partner: Not on file    Emotionally abused: Not on file    Physically  abused: Not on file    Forced sexual activity: Not on file  Other Topics Concern  . Not on file  Social History Narrative   Lives at home, mom currently staying with her   Left-handed   Caffeine: occasional decaf coffee or raspberry tea    Family History  Problem Relation Age of Onset  . Heart attack Mother   . Diabetes Mother   . Post-traumatic stress disorder Father        committed suicide  . Diabetes Father   . Hypertension Sister   . Thyroid cancer Paternal Grandmother    Review of Systems Pertinent negatives listed in HPI  Patient Active Problem List   Diagnosis Date Noted  . Hemiparesis affecting right side as late effect of  cerebrovascular accident (HCC) 03/23/2017  . Lacunar infarct, acute 03/23/2017  . Visual disturbance as complication of stroke 03/07/2017  . Adjustment disorder with anxious mood   . 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 Sickle cell trait (HCC)   . Hyperlipidemia   . Thyroid nodule   . Changes in vision   . Acute ischemic stroke (HCC)   . Dysphagia, post-stroke   . Benign essential HTN   . Polycystic kidney disease   . Prediabetes   . Acute intractable headache   . Leukocytosis   . Stroke (cerebrum) (HCC) 02/25/2017    Allergies  Allergen Reactions  . Penicillins Anaphylaxis    Prior to Admission medications   Medication Sig Start Date End Date Taking? Authorizing Provider  aspirin EC 325 MG tablet Take 1 tablet (325 mg total) daily by mouth. 09/25/17 09/25/18 Yes Bing Neighbors, FNP  atorvastatin (LIPITOR) 40 MG tablet TAKE 1 TABLET (40 MG TOTAL) BY MOUTH DAILY AT 6 PM. 09/01/17  Yes Bing Neighbors, FNP  butalbital-acetaminophen-caffeine (FIORICET WITH CODEINE) [phone] MG capsule TAKE ONE CAPSULE EVERY 4 HOURS AS NEEDED FOR HEADACHE 07/25/17  Yes Bing Neighbors, FNP  cyclobenzaprine (FLEXERIL) 5 MG tablet TAKE 1 TABLET 3 TIMES A DAY AS NEEDED FOR MUSCLE SPASM SHOULDER/NECK PAIN 02/06/18  Yes Bing Neighbors, FNP  DULoxetine (CYMBALTA) 30 MG capsule Take 1 tablet at bedtime 10/27/17  Yes Van Clines, MD  gabapentin (NEURONTIN) 300 MG capsule TAKE 1 TABLET IN THE MORNING AND 2 TABLETS AT BEDTIME DAILY 11/22/17  Yes Bing Neighbors, FNP  losartan (COZAAR) 25 MG tablet TAKE 1 TABLET BY MOUTH EVERY DAY 01/22/18  Yes Bing Neighbors, FNP  MAGNESIUM PO Take 1 tablet by mouth at bedtime.   Yes [provider]  metFORMIN (GLUCOPHAGE) 500 MG tablet Take 1 tablet (500 mg total) by mouth 2 (two) times daily with a meal. 03/17/17  Yes Bing Neighbors, FNP  naphazoline-glycerin (CLEAR EYES) 0.012-0.2 % SOLN Place 1-2 drops  into both eyes 4 (four) times daily -  with meals and at bedtime. 03/10/17  Yes Love, Evlyn Kanner, PA-C  topiramate (TOPAMAX) 100 MG tablet Take 1 tablet (100 mg total) by mouth 2 (two) times daily. Take 100mg  in am and 200mg  at night 03/23/17 03/23/18 Yes Micki Riley, MD  acetaminophen (TYLENOL) 325 MG tablet Take 1-2 tablets (325-650 mg total) by mouth every 4 (four) hours as needed for mild pain. Patient not taking: Reported on 12/05/2017 03/09/17   Love, Evlyn Kanner, PA-C  traMADol (ULTRAM) 50 MG tablet Take 1-2 tablets (50-100 mg total) by mouth every 6 (six) hours as needed for moderate pain. Patient not  taking: Reported on 12/05/2017 09/12/17   Bing Neighbors, FNP    Past Medical, Surgical Family and Social History reviewed and updated.    Objective:   Today's Vitals   02/06/18 1029 02/06/18 1122  BP: (!) 150/92 (!) 144/80  Pulse: 90   Resp: 16   Temp: 98.4 F (36.9 C)   TempSrc: Oral   SpO2: 98%   Weight: 234 lb (106.1 kg)   Height: 5\' 3"  (1.6 m)     Wt Readings from Last 3 Encounters:  02/06/18 234 lb (106.1 kg)  12/05/17 215 lb (97.5 kg)  10/27/17 218 lb (98.9 kg)    Physical Exam Constitutional: She is oriented to person, place, and time. She appears well-developed and well-nourished.  HENT:  Head: Normocephalic and atraumatic.  Eyes: Conjunctivae and EOM are normal. Pupils are equal, round, and reactive to light.  Neck: Normal range of motion. Neck supple.  Cardiovascular: Normal rate, regular rhythm, normal heart sounds and intact distal pulses.  Pulmonary/Chest: Effort normal and breath sounds normal.  Abdominal: Soft. Bowel sounds are normal.  Musculoskeletal: Normal range of motion.  Neurological: She is alert and oriented to person, place, and time. Coordination abnormal. GCS eye subscore is 4. GCS verbal subscore is 5. GCS motor subscore is 6.  Chronic right sided hemiparesis   Psychiatric: She has a normal mood and affect. Her behavior is normal. Judgment  and thought content normal.   Assessment & Plan:  1. Prediabetes, A1C 6.2 increased from 5.8. Encouraged consistently taking metformin. Increase water intake.  Continue consistent routine physical activity.  2. CVA, continue antiplatelet therapy and maintain good BP control.  3. Breast screening,  MM Digital Screening; Future  4. Nonintractable headache, unspecified chronicity pattern, unspecified headache type Deferring management to neurology. Will continue Fioricet for acute management of headache pain.   5. Class 3 severe obesity with body mass index (BMI) of 40.0 to 44.9 in adult, unspecified obesity type, unspecified whether serious comorbidity present (HCC), Goal BMI  <25. Encouraged efforts to reduce weight include engaging in physical activity as tolerated with goal of 150 minutes per week. Improve dietary choices and eat a meal regimen consistent with a Mediterranean or DASH diet. Reduce simple carbohydrates. Do not skip meals and eat healthy snacks throughout the day to avoid over-eating at dinner. Set a goal weight loss that is achievable for you. Ambulatory referral  to Medical Weight Management. Discontinue Cymbalta, start Wellbutrin for mood and weight loss   6. Leukocytes in urine,  Urine Culture  7. Essential Hypertension, elevated today. We have discussed target BP range and blood pressure goal. I have advised patient to check BP regularly and to call us back or report to clinic if the numbers are consistently higher than 140/90. We discussed the importance of compliance with medical therapy and DASH diet recommended, consequences of uncontrolled hypertension discussed. Continue current BP medications  8. Insomnia, chronic. Will trial Ambien 5 mg once daily as needed at bedtime  9. Depression, d/c Cymbalta (due to  increase weight gain and medication ineffectiveness). Start Wellbutrin 150 mg, extended release.   RTC: 6 months for chronic conditions   Godfrey Pick. Tiburcio Pea,  MSN, FNP-C [facility]  [address] [place], [place] [address] [phone]

## 2018-02-06 NOTE — Patient Instructions (Addendum)
Start Wellbutrin 150 mg once daily. Stop Cymbalta in 7 days.    Continue weight loss efforts through diet and exercise. If no improvement in weight loss, we will refer you to the Bariatric Clinic.  Try Ambien 5 mg once daily at bedtime as needed for sleep.   Insomnia Insomnia is a sleep disorder that makes it difficult to fall asleep or to stay asleep. Insomnia can cause tiredness (fatigue), low energy, difficulty concentrating, mood swings, and poor performance at work or school. There are three different ways to classify insomnia:  Difficulty falling asleep.  Difficulty staying asleep.  Waking up too early in the morning.  Any type of insomnia can be long-term (chronic) or short-term (acute). Both are common. Short-term insomnia usually lasts for three months or less. Chronic insomnia occurs at least three times a week for longer than three months. What are the causes? Insomnia may be caused by another condition, situation, or substance, such as:  Anxiety.  Certain medicines.  Gastroesophageal reflux disease (GERD) or other gastrointestinal conditions.  Asthma or other breathing conditions.  Restless legs syndrome, sleep apnea, or other sleep disorders.  Chronic pain.  Menopause. This may include hot flashes.  Stroke.  Abuse of alcohol, tobacco, or illegal drugs.  Depression.  Caffeine.  Neurological disorders, such as Alzheimer disease.  An overactive thyroid (hyperthyroidism).  The cause of insomnia may not be known. What increases the risk? Risk factors for insomnia include:  Gender. Women are more commonly affected than men.  Age. Insomnia is more common as you get older.  Stress. This may involve your professional or personal life.  Income. Insomnia is more common in people with lower income.  Lack of exercise.  Irregular work schedule or night shifts.  Traveling between different time zones.  What are the signs or symptoms? If you have  insomnia, trouble falling asleep or trouble staying asleep is the main symptom. This may lead to other symptoms, such as:  Feeling fatigued.  Feeling nervous about going to sleep.  Not feeling rested in the morning.  Having trouble concentrating.  Feeling irritable, anxious, or depressed.  How is this treated? Treatment for insomnia depends on the cause. If your insomnia is caused by an underlying condition, treatment will focus on addressing the condition. Treatment may also include:  Medicines to help you sleep.  Counseling or therapy.  Lifestyle adjustments.  Follow these instructions at home:  Take medicines only as directed by your health care provider.  Keep regular sleeping and waking hours. Avoid naps.  Keep a sleep diary to help you and your health care provider figure out what could be causing your insomnia. Include: ? When you sleep. ? When you wake up during the night. ? How well you sleep. ? How rested you feel the next day. ? Any side effects of medicines you are taking. ? What you eat and drink.  Make your bedroom a comfortable place where it is easy to fall asleep: ? Put up shades or special blackout curtains to block light from outside. ? Use a white noise machine to block noise. ? Keep the temperature cool.  Exercise regularly as directed by your health care provider. Avoid exercising right before bedtime.  Use relaxation techniques to manage stress. Ask your health care provider to suggest some techniques that may work well for you. These may include: ? Breathing exercises. ? Routines to release muscle tension. ? Visualizing peaceful scenes.  Cut back on alcohol, caffeinated beverages, and cigarettes, especially  close to bedtime. These can disrupt your sleep.  Do not overeat or eat spicy foods right before bedtime. This can lead to digestive discomfort that can make it hard for you to sleep.  Limit screen use before bedtime. This  includes: ? Watching TV. ? Using your smartphone, tablet, and computer.  Stick to a routine. This can help you fall asleep faster. Try to do a quiet activity, brush your teeth, and go to bed at the same time each night.  Get out of bed if you are still awake after 15 minutes of trying to sleep. Keep the lights down, but try reading or doing a quiet activity. When you feel sleepy, go back to bed.  Make sure that you drive carefully. Avoid driving if you feel very sleepy.  Keep all follow-up appointments as directed by your health care provider. This is important. Contact a health care provider if:  You are tired throughout the day or have trouble in your daily routine due to sleepiness.  You continue to have sleep problems or your sleep problems get worse. Get help right away if:  You have serious thoughts about hurting yourself or someone else. This information is not intended to replace advice given to you by your health care provider. Make sure you discuss any questions you have with your health care provider. Document Released: 10/28/2000 Document Revised: 04/01/2016 Document Reviewed: 08/01/2014 Elsevier Interactive Patient Education  [employer]2018 [employer]

## 2018-02-08 LAB — URINE CULTURE

## 2018-02-19 ENCOUNTER — Other Ambulatory Visit: Payer: Self-pay | Admitting: Family Medicine

## 2018-02-19 DIAGNOSIS — Z1231 Encounter for screening mammogram for malignant neoplasm of breast: Secondary | ICD-10-CM

## 2018-03-07 ENCOUNTER — Other Ambulatory Visit: Payer: Self-pay | Admitting: Family Medicine

## 2018-03-19 ENCOUNTER — Other Ambulatory Visit: Payer: Self-pay | Admitting: Family Medicine

## 2018-03-19 ENCOUNTER — Other Ambulatory Visit: Payer: Self-pay | Admitting: Neurology

## 2018-03-19 DIAGNOSIS — R51 Headache: Principal | ICD-10-CM

## 2018-03-19 DIAGNOSIS — R519 Headache, unspecified: Secondary | ICD-10-CM

## 2018-03-20 ENCOUNTER — Encounter: Payer: Medicare Other | Admitting: Psychology

## 2018-04-05 ENCOUNTER — Encounter: Payer: Medicare Other | Admitting: Psychology

## 2018-04-06 ENCOUNTER — Other Ambulatory Visit: Payer: Self-pay | Admitting: Family Medicine

## 2018-04-06 DIAGNOSIS — I6930 Unspecified sequelae of cerebral infarction: Secondary | ICD-10-CM

## 2018-04-10 NOTE — Telephone Encounter (Signed)
Dr. Hyman Hopes, Will you please review these refill request for a patient? She saw Selena  last in March 2019. Thanks!

## 2018-05-03 ENCOUNTER — Encounter (INDEPENDENT_AMBULATORY_CARE_PROVIDER_SITE_OTHER): Payer: Medicare Other

## 2018-05-28 ENCOUNTER — Ambulatory Visit (INDEPENDENT_AMBULATORY_CARE_PROVIDER_SITE_OTHER): Payer: Medicare Other | Admitting: Family Medicine

## 2018-05-28 ENCOUNTER — Encounter (INDEPENDENT_AMBULATORY_CARE_PROVIDER_SITE_OTHER): Payer: Self-pay | Admitting: Family Medicine

## 2018-05-28 VITALS — BP 153/111 | HR 110 | Temp 98.2°F | Ht 62.0 in | Wt 234.0 lb

## 2018-05-28 DIAGNOSIS — E7849 Other hyperlipidemia: Secondary | ICD-10-CM

## 2018-05-28 DIAGNOSIS — R0602 Shortness of breath: Secondary | ICD-10-CM | POA: Insufficient documentation

## 2018-05-28 DIAGNOSIS — E538 Deficiency of other specified B group vitamins: Secondary | ICD-10-CM | POA: Diagnosis not present

## 2018-05-28 DIAGNOSIS — Z6841 Body Mass Index (BMI) 40.0 and over, adult: Secondary | ICD-10-CM | POA: Diagnosis not present

## 2018-05-28 DIAGNOSIS — Z1331 Encounter for screening for depression: Secondary | ICD-10-CM

## 2018-05-28 DIAGNOSIS — I10 Essential (primary) hypertension: Secondary | ICD-10-CM | POA: Diagnosis not present

## 2018-05-28 DIAGNOSIS — R5383 Other fatigue: Secondary | ICD-10-CM

## 2018-05-28 DIAGNOSIS — Z0289 Encounter for other administrative examinations: Secondary | ICD-10-CM

## 2018-05-28 DIAGNOSIS — E119 Type 2 diabetes mellitus without complications: Secondary | ICD-10-CM

## 2018-05-28 DIAGNOSIS — E559 Vitamin D deficiency, unspecified: Secondary | ICD-10-CM | POA: Diagnosis not present

## 2018-05-28 MED ORDER — BLOOD GLUCOSE MONITOR KIT
PACK | 0 refills | Status: AC
Start: 2018-05-28 — End: ?

## 2018-05-28 MED ORDER — ONETOUCH DELICA LANCETS 33G MISC: 1.0000 | Freq: Two times a day (BID) | 0 refills | Status: DC

## 2018-05-28 MED ORDER — GLUCOSE BLOOD VI STRP
ORAL_STRIP | 0 refills | Status: DC
Start: 2018-05-28 — End: 2018-07-25

## 2018-05-28 NOTE — Progress Notes (Signed)
Office: [phone]  /  Fax: [phone]   Dear Sharon Glatter, FNP,   Thank you for referring Sharon Chan to our clinic. The following note includes my evaluation and treatment recommendations.  HPI:   Chief Complaint: OBESITY    Sharon Chan has been referred by Sharon Glatter, FNP for consultation regarding her obesity and obesity related comorbidities.    Sharon Chan (MR# [id number]) is a 42 y.o. female who presents on 05/28/2018 for obesity evaluation and treatment. Current BMI is Body mass index is 42.8 kg/m.Marland Kitchen Sharon Chan has been struggling with her weight for many years and has been unsuccessful in either losing weight, maintaining weight loss, or reaching her healthy weight goal.     Sharon Chan attended our information session and states she is currently in the action stage of change and ready to dedicate time achieving and maintaining a healthier weight. Sharon Chan is interested in becoming our Sharon Chan and working on intensive lifestyle modifications including (but not limited to) diet, exercise and weight loss.    Sharon Chan states she struggles with family and or coworkers weight loss sabotage her desired weight loss is 100 lbs she has been heavy most of  her life she started gaining weight in college her heaviest weight ever was 240 lbs. she has significant food cravings issues  she skips meals frequently she is trying to eat vegetarian she has binge eating behaviors she struggles with emotional eating    Sharon Chan feels her energy is lower than it should be. This has worsened with weight gain and has not worsened recently. Sharon Chan admits to daytime somnolence and admits to waking up still tired. Sharon Chan is at risk for obstructive sleep apnea. Patent has a history of symptoms of daytime Sharon, morning Sharon, morning headache and Sharon. Sharon Chan generally gets 3 or 4 hours of sleep per night, and states they generally have restless sleep.  Snoring is not present. Apneic episodes are not present. Epworth Sleepiness Score is 3  Dyspnea on exertion Sharon Chan notes increasing shortness of breath with exercising and seems to be worsening over time with weight gain. She notes getting out of breath sooner with activity than she used to. This has not gotten worse recently. Sharon Chan denies orthopnea.  Sharon Chan was diagnosed with Sharon type II while hospitalized for CVA. Sharon Chan denies any hypoglycemic episodes. Last A1c was at 6.2 She is attempting to work on intensive lifestyle modifications including diet, exercise, and weight loss to help control her blood glucose levels.  Sharon Chan is a 42 y.o. female with Sharon. Her blood pressure is elevated today at 153/111. Her blood pressure seems to be uncontrolled overall, but Sharon Chan admits to white coat syndrome. Sharon Chan denies chest pain. She is working weight loss to help control her blood pressure with the goal of decreasing her risk of heart attack and stroke. Sharon Chan blood pressure is not currently controlled.  Sharon Chan has a diagnosis of Sharon D deficiency. She is not currently taking vit D. Sharon Chan admits Sharon and denies nausea, vomiting or muscle weakness.  Sharon B12 deficiency Sharon Chan has a diagnosis of B12 insufficiency and notes Sharon. She is currently taking metformin. This is a new diagnosis. Sharon Chan is a vegetarian and does not have a previous diagnosis of pernicious anemia. She does not have a history of weight loss surgery.   Hyperlipidemia Sharon Chan has hyperlipidemia and is currently taking Lipitor. Sharon Chan is attempting to improve her cholesterol levels with intensive  lifestyle modification including a low saturated fat diet, exercise and weight loss. She denies any chest pain, claudication or myalgias.  Depression Screen Sharon Chan's Food and Mood (modified PHQ-9) score was  Depression screen  PHQ 2/9 05/28/2018  Decreased Interest 2  Down, Depressed, Hopeless 3  PHQ - 2 Score 5  Altered sleeping 1  Tired, decreased energy 1  Change in appetite 3  Feeling bad or failure about yourself  3  Trouble concentrating 2  Moving slowly or fidgety/restless 0  Suicidal thoughts 3  PHQ-9 Score 18  Difficult doing work/chores Somewhat difficult    ALLERGIES: Allergies  Allergen Reactions  . Penicillins Anaphylaxis    MEDICATIONS: Current Outpatient Medications on File Prior to Visit  Medication Sig Dispense Refill  . acetaminophen (TYLENOL) 325 MG tablet Take 1-2 tablets (325-650 mg total) by mouth every 4 (four) hours as needed for mild pain.    Marland Kitchen aspirin EC 325 MG tablet Take 1 tablet (325 mg total) daily by mouth. 360 tablet 1  . atorvastatin (LIPITOR) 40 MG tablet Take 1 tablet (40 mg total) by mouth daily at 6 PM. 30 tablet 0  . buPROPion (WELLBUTRIN XL) 150 MG 24 hr tablet Take 1 tablet (150 mg total) by mouth daily. 90 tablet 1  . cyclobenzaprine (FLEXERIL) 5 MG tablet TAKE 1 TABLET 3 TIMES A DAY AS NEEDED FOR MUSCLE SPASM SHOULDER/NECK PAIN 90 tablet 0  . diphenhydrAMINE (BENADRYL ALLERGY) 25 MG tablet Take 25 mg by mouth every 6 (six) hours as needed.    . gabapentin (NEURONTIN) 300 MG capsule TAKE 1 TABLET IN THE MORNING AND 2 TABLETS AT BEDTIME DAILY 90 capsule 3  . losartan (COZAAR) 25 MG tablet TAKE 1 TABLET BY MOUTH EVERY DAY 90 tablet 1  . metFORMIN (GLUCOPHAGE) 500 MG tablet TAKE 1 TABLET (500 MG TOTAL) BY MOUTH 2 (TWO) TIMES DAILY WITH A MEAL. 180 tablet 3  . topiramate (TOPAMAX) 100 MG tablet Take 1 tablet (100 mg total) by mouth 2 (two) times daily. Take 153m in am and 2068mat night 90 tablet 3  . zolpidem (AMBIEN) 5 MG tablet TAKE 1 TABLET BY MOUTH AT BEDTIME AS NEEDED FOR SLEEP 30 tablet 0   No current facility-administered medications on file prior to visit.     PAST MEDICAL HISTORY: Past Medical History:  Diagnosis Date  . CVA (cerebral vascular  accident) ([facility]  . Depression   . DM type 2 (Sharon mellitus, type 2) ([facility]  . HLD (hyperlipidemia)   . HTN (Sharon)   . Joint pain   . Migraines   . Neck pain   . Polycystic disease, ovaries   . Right sided weakness   . Seizures ([facility]  . Shoulder pain   . Sickle cell trait ([facility]  . Thyroid nodule     PAST SURGICAL HISTORY: Past Surgical History:  Procedure Laterality Date  . LAPAROSCOPIC CHOLECYSTECTOMY      SOCIAL HISTORY: Social History   Tobacco Use  . Smoking status: Never Smoker  . Smokeless tobacco: Never Used  Substance Use Topics  . Alcohol use: No  . Drug use: No    FAMILY HISTORY: Family History  Problem Relation Age of Onset  . Heart attack Mother   . Sharon Mother   . Sharon Mother   . Hyperlipidemia Mother   . Post-traumatic stress disorder Father        committed suicide  . Sharon Father   . Sharon Sister   .  Thyroid cancer Paternal Grandmother     ROS: Review of Systems  Constitutional: Positive for malaise/Sharon.       Breasts Pain  HENT:       Dentures Dry Mouth  Eyes: Positive for pain.       Vision Changes Blurry or Double Vision Floaters  Respiratory: Positive for shortness of breath (on exertion).   Cardiovascular: Negative for chest pain, orthopnea and claudication.       Chest Pain/Discomfort  Gastrointestinal: Negative for nausea and vomiting.  Musculoskeletal: Positive for back pain and neck pain. Negative for myalgias.       Neck Stiffness Muscle or Joint Pain Negative for muscle weakness  Neurological: Positive for dizziness, tremors, weakness and headaches.  Endo/Heme/Allergies: Positive for polydipsia.       Hot or Cold Intolerance  Psychiatric/Behavioral: Positive for depression. The Sharon Chan is nervous/anxious (nervousness) and has insomnia.     PHYSICAL EXAM: Blood pressure (!) 153/111, pulse (!) 110, temperature 98.2 F (36.8 C), temperature source Oral, height 5' 2"  (1.575 m),  weight 234 lb (106.1 kg), last menstrual period 05/03/2018, SpO2 98 %. Body mass index is 42.8 kg/m. Physical Exam  Constitutional: She is oriented to person, place, and time. She appears well-developed and well-nourished.  HENT:  Head: Normocephalic and atraumatic.  Nose: Nose normal.  Eyes: EOM are normal. No scleral icterus.  Neck: Normal range of motion. Neck supple. No thyromegaly present.  Cardiovascular: Regular rhythm. Tachycardia present.  Pulmonary/Chest: Effort normal and breath sounds normal. No respiratory distress.  Abdominal: Soft. There is no tenderness.  + obesity  Musculoskeletal: Normal range of motion.  Range of Motion normal in all 4 extremities  Neurological: She is alert and oriented to person, place, and time. Coordination normal.  Skin: Skin is warm and dry.  Psychiatric: She has a normal mood and affect. Her behavior is normal.  Vitals reviewed.   RECENT LABS AND TESTS: BMET    Component Value Date/Time   NA 142 09/12/2017 1121   K 4.4 09/12/2017 1121   CL 110 09/12/2017 1121   CO2 23 09/12/2017 1121   GLUCOSE 101 (H) 09/12/2017 1121   BUN 13 09/12/2017 1121   CREATININE 0.92 09/12/2017 1121   CALCIUM 9.0 09/12/2017 1121   GFRNONAA 71 03/17/2017 1513   GFRAA 82 03/17/2017 1513   Lab Results  Component Value Date   HGBA1C 6.2 02/06/2018   No results found for: INSULIN CBC    Component Value Date/Time   WBC 5.3 09/12/2017 1121   RBC 4.90 09/12/2017 1121   HGB 12.3 09/12/2017 1121   HCT 37.7 09/12/2017 1121   PLT 308 09/12/2017 1121   MCV 76.9 (L) 09/12/2017 1121   MCH 25.1 (L) 09/12/2017 1121   MCHC 32.6 09/12/2017 1121   RDW 14.7 09/12/2017 1121   LYMPHSABS 2,237 09/12/2017 1121   MONOABS 396 03/17/2017 1513   EOSABS 133 09/12/2017 1121   BASOSABS 69 09/12/2017 1121   Iron/TIBC/Ferritin/ %Sat    Component Value Date/Time   IRON 59 03/01/2017 2105   TIBC 291 03/01/2017 2105   FERRITIN 47 03/01/2017 2105   IRONPCTSAT 20  03/01/2017 2105   Lipid Panel     Component Value Date/Time   CHOL 141 07/25/2017 1130   TRIG 104 07/25/2017 1130   HDL 52 07/25/2017 1130   CHOLHDL 2.7 07/25/2017 1130   VLDL 27 02/26/2017 0341   LDLCALC 70 07/25/2017 1130   Hepatic Function Panel     Component Value Date/Time  PROT 6.5 03/17/2017 1513   ALBUMIN 4.1 03/17/2017 1513   AST 17 03/17/2017 1513   ALT 19 03/17/2017 1513   ALKPHOS 71 03/17/2017 1513   BILITOT 0.3 03/17/2017 1513      Component Value Date/Time   TSH 0.76 11/30/2017 0954   TSH 0.99 05/24/2017 1629   TSH 0.22 (L) 04/14/2017 1558    ECG  shows NSR with a rate of 110 BPM INDIRECT CALORIMETER done today shows a VO2 of 169 and a REE of 1178.  Her calculated basal metabolic rate is 6568 thus her basal metabolic rate is worse than expected.    ASSESSMENT AND PLAN: Other Sharon - Plan: EKG 12-Lead, CBC With Differential, T3, T4, free, TSH  Shortness of breath on exertion  Type 2 Sharon mellitus without complication, without long-term current use of insulin (HCC) - Plan: Comprehensive metabolic panel, Hemoglobin A1c, Insulin, random, glucose blood test strip, ONETOUCH DELICA LANCETS 12X MISC, blood glucose meter kit and supplies KIT  Essential Sharon  Sharon D deficiency - Plan: Sharon D 25 Hydroxy (Vit-D Deficiency, Fractures)  B12 nutritional deficiency - Plan: Sharon B12  Other hyperlipidemia - Plan: Lipid Panel With LDL/HDL Ratio  Depression screening  Class 3 severe obesity with serious comorbidity and body mass index (BMI) of 40.0 to 44.9 in adult, unspecified obesity type (HCC)  PLAN: Sharon Kylie was informed that her Sharon may be related to obesity, depression or many other causes. Labs will be ordered, and in the meanwhile Kinslee has agreed to work on diet, exercise and weight loss to help with Sharon. Proper sleep hygiene was discussed including the need for 7-8 hours of quality sleep each night. A sleep study was  not ordered based on symptoms and Epworth score.  Dyspnea on exertion Piya's shortness of breath appears to be obesity related and exercise induced. She has agreed to work on weight loss and gradually increase exercise to treat her exercise induced shortness of breath. If Nimsi follows our instructions and loses weight without improvement of her shortness of breath, we will plan to refer to pulmonology. We will monitor this condition regularly. Lillyian agrees to this plan.  Sharon II Yareni has been given extensive Sharon education by myself today including ideal fasting and post-prandial blood glucose readings, individual ideal Hgb A1c goals and hypoglycemia prevention. We discussed the importance of good blood sugar control to decrease the likelihood of diabetic complications such as nephropathy, neuropathy, limb loss, blindness, coronary artery disease, and death. We discussed the importance of intensive lifestyle modification including diet, exercise and weight loss as the first line treatment for Sharon. Sharelle agrees to continue her Sharon medications and will follow up at the agreed upon time. Prescription was written today for glucometer, strips and lancets.  Sharon We discussed sodium restriction, working on healthy weight loss, and a regular exercise program as the means to achieve improved blood pressure control. Amiley agreed with this plan and agreed to follow up as directed. We will continue to monitor her blood pressure as well as her progress with the above lifestyle modifications. She will continue her medications as prescribed and will watch for signs of hypotension as she continues her lifestyle modifications. We will check labs and follow.  Sharon D Deficiency Sanaai was informed that low Sharon D levels contributes to Sharon and are associated with obesity, breast, and colon cancer. We will check labs and she will follow up for routine testing of Sharon D,  at least 2-3 times per year.  Sharon B12 deficiency Zoey will work on increasing B12 rich foods in her diet. B12 supplementation was not prescribed today. We will check labs and follow.  Hyperlipidemia Shyleigh was informed of the American Heart Association Guidelines emphasizing intensive lifestyle modifications as the first line treatment for hyperlipidemia. We discussed many lifestyle modifications today in depth, and Merrick will start to work on decreasing saturated fats such as fatty red meat, butter and many fried foods. She will also increase vegetables and lean protein in her diet and  work on exercise and weight loss efforts. We will check labs and Damiya agreed to follow up as directed.  Depression Screen Deeana had a strongly positive depression screening. Depression is commonly associated with obesity and often results in emotional eating behaviors. We will monitor this closely and work on CBT to help improve the non-hunger eating patterns. Referral to Psychology may be required if no improvement is seen as she continues in our clinic.  Obesity Noreta is currently in the action stage of change and her goal is to continue with weight loss efforts. I recommend Jasia begin the structured treatment plan as follows:  She has agreed to follow the Category 1 plan Ardean has been instructed to eventually work up to a goal of 150 minutes of combined cardio and strengthening exercise per week for weight loss and overall health benefits. We discussed the following Behavioral Modification Strategies today: increasing lean protein intake, decreasing simple carbohydrates  and work on meal planning and easy cooking plans  We will refer to Dr. Mallie Mussel, our board certified bariatric psychologist, for emotional eating.   She was informed of the importance of frequent follow up visits to maximize her success with intensive lifestyle modifications for her multiple health conditions. She was  informed we would discuss her lab results at her next visit unless there is a critical issue that needs to be addressed sooner. Venice agreed to keep her next visit at the agreed upon time to discuss these results.    OBESITY BEHAVIORAL INTERVENTION VISIT  Today's visit was # 1 out of 22.  Starting weight: 234 lbs Starting date: 05/28/18 Today's weight : 234 lbs Today's date: 05/28/2018 Total lbs lost to date: 0 (Patients must lose 7 lbs in the first 6 months to continue with counseling)   ASK: We discussed the diagnosis of obesity with [name] today and Nashae agreed to give [name] permission to discuss obesity behavioral modification therapy today.  ASSESS: Finley has the diagnosis of obesity and her BMI today is 42.79 Elvi is in the action stage of change   ADVISE: Deondrea was educated on the multiple health risks of obesity as well as the benefit of weight loss to improve her health. She was advised of the need for long term treatment and the importance of lifestyle modifications.  AGREE: Multiple dietary modification options and treatment options were discussed and  Rosaly agreed to the above obesity treatment plan.   I, Doreene Nest, am acting as transcriptionist for  Dennard Nip, MD    I have reviewed the above documentation for accuracy and completeness, and I agree with the above. -Dennard Nip, MD
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
## 2018-05-29 LAB — CBC WITH DIFFERENTIAL
BASOS ABS: 0 10*3/uL (ref 0.0–0.2)
Basos: 0 %
EOS (ABSOLUTE): 0.2 10*3/uL (ref 0.0–0.4)
EOS: 2 %
Hematocrit: 41.5 % (ref 34.0–46.6)
Hemoglobin: 13.5 g/dL (ref 11.1–15.9)
Immature Grans (Abs): 0 10*3/uL (ref 0.0–0.1)
Immature Granulocytes: 0 %
Lymphocytes Absolute: 2.9 10*3/uL (ref 0.7–3.1)
Lymphs: 31 %
MCH: 24.5 pg — ABNORMAL LOW (ref 26.6–33.0)
MCHC: 32.5 g/dL (ref 31.5–35.7)
MCV: 76 fL — ABNORMAL LOW (ref 79–97)
MONOS ABS: 0.8 10*3/uL (ref 0.1–0.9)
Monocytes: 8 %
Neutrophils Absolute: 5.4 10*3/uL (ref 1.4–7.0)
Neutrophils: 59 %
RBC: 5.5 x10E6/uL — ABNORMAL HIGH (ref 3.77–5.28)
RDW: 18.2 % — AB (ref 12.3–15.4)
WBC: 9.3 10*3/uL (ref 3.4–10.8)

## 2018-05-29 LAB — COMPREHENSIVE METABOLIC PANEL
ALK PHOS: 130 IU/L — AB (ref 39–117)
ALT: 23 IU/L (ref 0–32)
AST: 16 IU/L (ref 0–40)
Albumin/Globulin Ratio: 1.6 (ref 1.2–2.2)
Albumin: 4.3 g/dL (ref 3.5–5.5)
BUN/Creatinine Ratio: 12 (ref 9–23)
BUN: 10 mg/dL (ref 6–24)
Bilirubin Total: 0.2 mg/dL (ref 0.0–1.2)
CO2: 29 mmol/L (ref 20–29)
CREATININE: 0.84 mg/dL (ref 0.57–1.00)
Calcium: 9.6 mg/dL (ref 8.7–10.2)
Chloride: 97 mmol/L (ref 96–106)
GFR calc Af Amer: 98 mL/min/{1.73_m2} (ref >59)
GFR calc non Af Amer: 85 mL/min/{1.73_m2} (ref >59)
GLUCOSE: 130 mg/dL — AB (ref 65–99)
Globulin, Total: 2.7 g/dL (ref 1.5–4.5)
Potassium: 4 mmol/L (ref 3.5–5.2)
SODIUM: 140 mmol/L (ref 134–144)
Total Protein: 7 g/dL (ref 6.0–8.5)

## 2018-05-29 LAB — HEMOGLOBIN A1C
ESTIMATED AVERAGE GLUCOSE: 166 mg/dL
HEMOGLOBIN A1C: 7.4 % — AB (ref 4.8–5.6)

## 2018-05-29 LAB — T4, FREE: Free T4: 1.08 ng/dL (ref 0.82–1.77)

## 2018-05-29 LAB — T3: T3 TOTAL: 117 ng/dL (ref 71–180)

## 2018-05-29 LAB — VITAMIN B12: VITAMIN B 12: 268 pg/mL (ref 232–1245)

## 2018-05-29 LAB — VITAMIN D 25 HYDROXY (VIT D DEFICIENCY, FRACTURES): VIT D 25 HYDROXY: 6.9 ng/mL — AB (ref 30.0–100.0)

## 2018-05-29 LAB — TSH: TSH: 1.06 u[IU]/mL (ref 0.450–4.500)

## 2018-05-29 LAB — LIPID PANEL WITH LDL/HDL RATIO
Cholesterol, Total: 185 mg/dL (ref 100–199)
HDL: 53 mg/dL (ref >39)
LDL Calculated: 99 mg/dL (ref 0–99)
LDL/HDL RATIO: 1.9 ratio (ref 0.0–3.2)
Triglycerides: 166 mg/dL — ABNORMAL HIGH (ref 0–149)
VLDL CHOLESTEROL CAL: 33 mg/dL (ref 5–40)

## 2018-05-29 LAB — INSULIN, RANDOM: INSULIN: 27.1 u[IU]/mL — ABNORMAL HIGH (ref 2.6–24.9)

## 2018-06-07 ENCOUNTER — Other Ambulatory Visit: Payer: Self-pay | Admitting: Internal Medicine

## 2018-06-07 DIAGNOSIS — I6930 Unspecified sequelae of cerebral infarction: Secondary | ICD-10-CM

## 2018-06-11 ENCOUNTER — Ambulatory Visit (INDEPENDENT_AMBULATORY_CARE_PROVIDER_SITE_OTHER): Payer: Medicare Other | Admitting: Psychology

## 2018-06-11 DIAGNOSIS — F330 Major depressive disorder, recurrent, mild: Secondary | ICD-10-CM | POA: Diagnosis not present

## 2018-06-11 NOTE — Progress Notes (Unsigned)
  Office: [phone]  /  Fax: [phone]  Date: June 25, 2018 Time Seen:*** Duration:*** Provider: Lawerance CruelGaytri Barker, Psy.D. Type of Session: Individual Therapy   HPI: Sharon Chan was referred by Dr. Quillian Quincearen Beasley . Per the note for the initial visit with Dr. Dalbert GarnetBeasley on May 28, 2018, Sharon Chan significant food cravings issues; skips meals frequently; has binge eating behaviors; and struggles with emotional eating. During that initial visit, Sharon Chan Food and Mood (modified PHQ-9) score was18. Per the note for the initial visit with this provider on June 11, 2018, Sharon Chan reported she experiences mindless eating. For example, she explained it happens when watching television and at night. She reported she also experiences fluctuations between poor appetite to overeating. Sharon Chan further reported she feels guilty when she overeats. Regarding binge eating, Sharon Chan shared, "That is something I used to do." Currently, she noted, "I might eat a box of honey bun throughout the day." In addition, Sharon Chan shared she is a vegetarian and her current meal plan "has been good." During the initial appointment with this provider, Sharon Chan was asked to complete a questionnaire assessing various behaviors related to emotional eating. Sharon Chan endorsed the following: eat certain foods when you are anxious, stressed, depressed, or your feelings are hurt; use food to help you cope with emotional situations; find food is comforting to you; and overeat when you are worried about something. Furthermore, per the note for the initial visit with Dr. Dalbert GarnetBeasley on May 28, 2018, Sharon Chan been heavy most of herlife and shestarted gaining weight in college. Herheaviest weight ever was 240pounds. During the initial appointment with this provider, Sharon Chan shared she first began emotionally eating in high school. She explained she experienced "tragedies" that resulted in emotional eating. She noted she has never been diagnosed with an  eating disorder   Session Content: Session focused on the goals of decrease emotional eating and improve coping skills. The session was initiated with the administration of the PHQ-9 and GAD-7, as well as a a brief check-in.   Sharon Chan was receptive to today's session as evidenced by ***.  Mental Status Examination: Sharon Chan arrived on time for the appointment. She presented as appropriately dressed and groomed. Sharon Chan appeared her stated age and demonstrated adequate orientation to time, place, person, and purpose of the appointment. She also demonstrated appropriate eye contact. No psychomotor abnormalities or behavioral peculiarities noted. Her mood was *** with congruent affect. Her thought processes were logical, linear, and goal-directed. No hallucinations, delusions, bizarre thinking or behavior reported or observed. Judgment, insight, and impulse control appeared to be grossly intact. There was no evidence of paraphasias (i.e., errors in speech, gross mispronunciations, and word substitutions), repetition deficits, or disturbances in volume or prosody (i.e., rhythm and intonation). There was no evidence of attention or memory impairments. Sharon Chan denied current suicidal and homicidal ideation, intent or plan.  Interventions: Sharon Chan was administered the PHQ-9 and GAD-7 for symptom monitoring. Content from the last session was reviewed (I.e., emotional versus physical hunger). Throughout today's session, empathic reflections and validation were provided. Psychoeducation regarding *** was provided and *** [insert other interventions].   DSM-5 Diagnosis: 296.31 (F33.0) Major Depressive Disorder, Recurrent Episode, Mild, With Anxious Distress  Plan: Sharon Chan continues to appear able and willing to participate as evidenced by engagement in reciprocal conversation, and asking questions for clarification as appropriate.*** The next appointment will be scheduled in ***. The next session will focus on  reviewing learned skills, and working towards established treatment goals.

## 2018-06-12 ENCOUNTER — Ambulatory Visit (INDEPENDENT_AMBULATORY_CARE_PROVIDER_SITE_OTHER): Payer: Medicare Other | Admitting: Family Medicine

## 2018-06-12 VITALS — BP 153/104 | HR 104 | Temp 98.2°F | Ht 62.0 in | Wt 230.0 lb

## 2018-06-12 DIAGNOSIS — Z6841 Body Mass Index (BMI) 40.0 and over, adult: Secondary | ICD-10-CM | POA: Diagnosis not present

## 2018-06-12 DIAGNOSIS — E559 Vitamin D deficiency, unspecified: Secondary | ICD-10-CM | POA: Diagnosis not present

## 2018-06-12 DIAGNOSIS — E119 Type 2 diabetes mellitus without complications: Secondary | ICD-10-CM

## 2018-06-12 MED ORDER — LIRAGLUTIDE 18 MG/3ML ~~LOC~~ SOPN
0.6000 mg | PEN_INJECTOR | SUBCUTANEOUS | 0 refills | Status: DC
Start: 2018-06-12 — End: 2018-07-10

## 2018-06-12 MED ORDER — INSULIN PEN NEEDLE 32G X 4 MM MISC: 1.0000 | Freq: Two times a day (BID) | 0 refills | Status: DC

## 2018-06-12 MED ORDER — VITAMIN D (ERGOCALCIFEROL) 1.25 MG (50000 UNIT) PO CAPS: 50000.0000 [IU] | ORAL_CAPSULE | ORAL | 0 refills | Status: DC

## 2018-06-13 NOTE — Progress Notes (Signed)
Office: [phone]  /  Fax: [phone]   HPI:   Chief Complaint: OBESITY Sharon Chan is here to discuss her progress with her obesity treatment plan. She is on the Category 1 plan and is following her eating plan approximately 95 % of the time. She states she is walking 5 to 10 miles 5 to 6 days per week. Sharon Chan did very well with weight loss on the category 1 plan. She did well with meal planning and prepping, but she had some challenges with hunger and temptations. Her weight is 230 lb (104.3 kg) today and has had a weight loss of 4 pounds over a period of 2 weeks since her last visit. She has lost 4 lbs since starting treatment with [name].  Vitamin D deficiency (new) Sharon Chan has a new diagnosis of vitamin D deficiency. She is not currently taking vit D. Sharon Chan admits fatigue and denies nausea, vomiting or muscle weakness.  Diabetes II Sharon Chan has a diagnosis of diabetes type II. Sharon Chan  Admits polyphagia and denies any hypoglycemic episodes. Last A1c was at 7.4 on metformin and is not at goal. She has been working on intensive lifestyle modifications including diet, exercise, and weight loss to help control her blood glucose levels.  ALLERGIES: Allergies  Allergen Reactions  . Penicillins Anaphylaxis    MEDICATIONS: Current Outpatient Medications on File Prior to Visit  Medication Sig Dispense Refill  . acetaminophen (TYLENOL) 325 MG tablet Take 1-2 tablets (325-650 mg total) by mouth every 4 (four) hours as needed for mild pain.    Marland Kitchen aspirin EC 325 MG tablet Take 1 tablet (325 mg total) daily by mouth. 360 tablet 1  . atorvastatin (LIPITOR) 40 MG tablet Take 1 tablet (40 mg total) by mouth daily at 6 PM. 30 tablet 0  . blood glucose meter kit and supplies KIT Dispense based on patient and insurance preference. Use up to four times daily as directed. (FOR ICD-9 250.00, 250.01). 1 each 0  . buPROPion (WELLBUTRIN XL) 150 MG 24 hr tablet Take 1 tablet (150 mg total) by mouth daily.  90 tablet 1  . cyclobenzaprine (FLEXERIL) 5 MG tablet TAKE 1 TABLET 3 TIMES A DAY AS NEEDED FOR MUSCLE SPASM SHOULDER/NECK PAIN 90 tablet 0  . diphenhydrAMINE (BENADRYL ALLERGY) 25 MG tablet Take 25 mg by mouth every 6 (six) hours as needed.    . gabapentin (NEURONTIN) 300 MG capsule TAKE 1 TABLET IN THE MORNING AND 2 TABLETS AT BEDTIME DAILY 90 capsule 3  . glucose blood test strip Use as instructed 100 each 0  . losartan (COZAAR) 25 MG tablet TAKE 1 TABLET BY MOUTH EVERY DAY 90 tablet 1  . metFORMIN (GLUCOPHAGE) 500 MG tablet TAKE 1 TABLET (500 MG TOTAL) BY MOUTH 2 (TWO) TIMES DAILY WITH A MEAL. 180 tablet 3  . ONETOUCH DELICA LANCETS 16W MISC 1 Package by Does not apply route 2 (two) times daily. 100 each 0  . zolpidem (AMBIEN) 5 MG tablet TAKE 1 TABLET BY MOUTH EVERY DAY AT BEDTIME AS NEEDED FOR SLEEP 30 tablet 0  . topiramate (TOPAMAX) 100 MG tablet Take 1 tablet (100 mg total) by mouth 2 (two) times daily. Take 160m in am and 2010mat night 90 tablet 3   No current facility-administered medications on file prior to visit.     PAST MEDICAL HISTORY: Past Medical History:  Diagnosis Date  . CV[facility] (cerebral vascular accident) ([facility]  . Depression   . DM type 2 (diabetes mellitus, type 2) (  [place])   . HLD (hyperlipidemia)   . HTN (hypertension)   . Joint pain   . Migraines   . Neck pain   . Polycystic disease, ovaries   . Right sided weakness   . Seizures ([place])   . Shoulder pain   . Sickle cell trait ([place])   . Thyroid nodule     PAST SURGICAL HISTORY: Past Surgical History:  Procedure Laterality Date  . LAPAROSCOPIC CHOLECYSTECTOMY      SOCIAL HISTORY: Social History   Tobacco Use  . Smoking status: Never Smoker  . Smokeless tobacco: Never Used  Substance Use Topics  . Alcohol use: No  . Drug use: No    FAMILY HISTORY: Family History  Problem Relation Age of Onset  . Heart attack Mother   . Diabetes Mother   . Hypertension Mother   . Hyperlipidemia Mother   .  Post-traumatic stress disorder Father        committed suicide  . Diabetes Father   . Hypertension Sister   . Thyroid cancer Paternal Grandmother     ROS: Review of Systems  Constitutional: Positive for malaise/fatigue and weight loss.  Gastrointestinal: Negative for nausea and vomiting.  Musculoskeletal:       Negative for muscle weakness  Endo/Heme/Allergies:       Positive for polyphagia Negative for hypoglycemia     PHYSICAL EXAM: Blood pressure (!) 153/104, pulse (!) 104, temperature 98.2 F (36.8 C), temperature source Oral, height 5' 2"  (1.575 m), weight 230 lb (104.3 kg), last menstrual period 06/07/2018, SpO2 98 %. Body mass index is 42.07 kg/m. Physical Exam  Constitutional: She is oriented to person, place, and time. She appears well-developed and well-nourished.  Cardiovascular: Normal rate.  Pulmonary/Chest: Effort normal.  Musculoskeletal: Normal range of motion.  Neurological: She is oriented to person, place, and time.  Skin: Skin is warm and dry.  Psychiatric: She has a normal mood and affect. Her behavior is normal.  Vitals reviewed.   RECENT LABS AND TESTS: BMET    Component Value Date/Time   NA 140 05/28/2018 0918   K 4.0 05/28/2018 0918   CL 97 05/28/2018 0918   CO2 29 05/28/2018 0918   GLUCOSE 130 (H) 05/28/2018 0918   GLUCOSE 101 (H) 09/12/2017 1121   BUN 10 05/28/2018 0918   CREATININE 0.84 05/28/2018 0918   CREATININE 0.92 09/12/2017 1121   CALCIUM 9.6 05/28/2018 0918   GFRNONAA 85 05/28/2018 0918   GFRNONAA 71 03/17/2017 1513   GFRAA 98 05/28/2018 0918   GFRAA 82 03/17/2017 1513   Lab Results  Component Value Date   HGBA1C 7.4 (H) 05/28/2018   HGBA1C 6.2 02/06/2018   HGBA1C 5.8 07/25/2017   HGBA1C 6.3 (H) 02/26/2017   Lab Results  Component Value Date   INSULIN 27.1 (H) 05/28/2018   CBC    Component Value Date/Time   WBC 9.3 05/28/2018 0918   WBC 5.3 09/12/2017 1121   RBC 5.50 (H) 05/28/2018 0918   RBC 4.90 09/12/2017  1121   HGB 13.5 05/28/2018 0918   HCT 41.5 05/28/2018 0918   PLT 308 09/12/2017 1121   MCV 76 (L) 05/28/2018 0918   MCH 24.5 (L) 05/28/2018 0918   MCH 25.1 (L) 09/12/2017 1121   MCHC 32.5 05/28/2018 0918   MCHC 32.6 09/12/2017 1121   RDW 18.2 (H) 05/28/2018 0918   LYMPHSABS 2.9 05/28/2018 0918   MONOABS 396 03/17/2017 1513   EOSABS 0.2 05/28/2018 0918   BASOSABS 0.0 05/28/2018 6314  Iron/TIBC/Ferritin/ %Sat    Component Value Date/Time   IRON 59 03/01/2017 2105   TIBC 291 03/01/2017 2105   FERRITIN 47 03/01/2017 2105   IRONPCTSAT 20 03/01/2017 2105   Lipid Panel     Component Value Date/Time   CHOL 185 05/28/2018 0918   TRIG 166 (H) 05/28/2018 0918   HDL 53 05/28/2018 0918   CHOLHDL 2.7 07/25/2017 1130   VLDL 27 02/26/2017 0341   LDLCALC 99 05/28/2018 0918   LDLCALC 70 07/25/2017 1130   Hepatic Function Panel     Component Value Date/Time   PROT 7.0 05/28/2018 0918   ALBUMIN 4.3 05/28/2018 0918   AST 16 05/28/2018 0918   ALT 23 05/28/2018 0918   ALKPHOS 130 (H) 05/28/2018 0918   BILITOT 0.2 05/28/2018 0918      Component Value Date/Time   TSH 1.060 05/28/2018 0918   TSH 0.76 11/30/2017 0954   TSH 0.99 05/24/2017 1629   Results for LINAH, KLAPPER (MRN [id number]) as of 06/13/2018 09:29  Ref. Range 05/28/2018 09:18  Vitamin D, 25-Hydroxy Latest Ref Range: 30.0 - 100.0 ng/mL 6.9 (L)   ASSESSMENT AND PLAN: Vitamin D deficiency - Plan: Vitamin D, Ergocalciferol, (DRISDOL) 50000 units CAPS capsule  Type 2 diabetes mellitus without complication, without long-term current use of insulin (HCC) - Plan: liraglutide (VICTOZA) 18 MG/3ML SOPN, Insulin Pen Needle (BD PEN NEEDLE NANO 2ND GEN) 32G X 4 MM MISC  Class 3 severe obesity with serious comorbidity and body mass index (BMI) of 40.0 to 44.9 in adult, unspecified obesity type ([place])  PLAN:  Vitamin D Deficiency (new) Resa was informed that low vitamin D levels contributes to fatigue and are associated with  obesity, breast, and colon cancer. She agrees to start prescription Vit D @50 ,000 IU every week #4 with no refills. We will recheck labs in 3 months  and will follow up for routine testing of vitamin D, at least 2-3 times per year. She was informed of the risk of over-replacement of vitamin D and agrees to not increase her dose unless she discusses this with [name] first. Fayrene agrees to follow up as directed.  Diabetes II Chivonne has been given extensive diabetes education by myself today including ideal fasting and post-prandial blood glucose readings, individual ideal Hgb A1c goals and hypoglycemia prevention. We discussed the importance of good blood sugar control to decrease the likelihood of diabetic complications such as nephropathy, neuropathy, limb loss, blindness, coronary artery disease, and death. We discussed the importance of intensive lifestyle modification including diet, exercise and weight loss as the first line treatment for diabetes. Kamelia agrees to start Victoza 0.6 mg qAM #1 pen with no refills and nano needles #100 (1 package) and follow up at the agreed upon time.  Obesity Judieth is currently in the action stage of change. As such, her goal is to continue with weight loss efforts She has agreed to follow the Category 1 plan Aniko has been instructed to work up to a goal of 150 minutes of combined cardio and strengthening exercise per week for weight loss and overall health benefits. We discussed the following Behavioral Modification Strategies today: better snacking choices, increasing lean protein intake, decreasing simple carbohydrates , work on meal planning and easy cooking plans and ways to avoid boredom eating  Taziyah has agreed to follow up with our clinic in 2 weeks. She was informed of the importance of frequent follow up visits to maximize her success with intensive lifestyle modifications for her multiple health  conditions.   OBESITY BEHAVIORAL INTERVENTION  VISIT  Today's visit was # 2 out of 22.  Starting weight: 234 lbs Starting date: 05/28/18 Today's weight : 230 lbs  Today's date: 06/12/2018 Total lbs lost to date: 4    ASK: We discussed the diagnosis of obesity with [name] today and Shevon agreed to give [name] permission to discuss obesity behavioral modification therapy today.  ASSESS: Simranjit has the diagnosis of obesity and her BMI today is 42.06 Anyae is in the action stage of change   ADVISE: Kathrin was educated on the multiple health risks of obesity as well as the benefit of weight loss to improve her health. She was advised of the need for long term treatment and the importance of lifestyle modifications.  AGREE: Multiple dietary modification options and treatment options were discussed and  Stephinie agreed to the above obesity treatment plan.  I, Doreene Nest, am acting as transcriptionist for Dennard Nip, MD  I have reviewed the above documentation for accuracy and completeness, and I agree with the above. -Dennard Nip, MD

## 2018-06-14 ENCOUNTER — Encounter (INDEPENDENT_AMBULATORY_CARE_PROVIDER_SITE_OTHER): Payer: Self-pay | Admitting: Family Medicine

## 2018-06-25 ENCOUNTER — Ambulatory Visit (INDEPENDENT_AMBULATORY_CARE_PROVIDER_SITE_OTHER): Payer: Medicare Other | Admitting: Psychology

## 2018-06-25 ENCOUNTER — Encounter (INDEPENDENT_AMBULATORY_CARE_PROVIDER_SITE_OTHER): Payer: Self-pay

## 2018-06-26 ENCOUNTER — Ambulatory Visit (INDEPENDENT_AMBULATORY_CARE_PROVIDER_SITE_OTHER): Payer: Medicare Other | Admitting: Family Medicine

## 2018-06-26 VITALS — BP 146/95 | HR 93 | Temp 98.2°F | Ht 62.0 in | Wt 226.0 lb

## 2018-06-26 DIAGNOSIS — E119 Type 2 diabetes mellitus without complications: Secondary | ICD-10-CM

## 2018-06-26 DIAGNOSIS — Z6841 Body Mass Index (BMI) 40.0 and over, adult: Secondary | ICD-10-CM

## 2018-06-26 DIAGNOSIS — E559 Vitamin D deficiency, unspecified: Secondary | ICD-10-CM | POA: Diagnosis not present

## 2018-06-26 MED ORDER — VITAMIN D (ERGOCALCIFEROL) 1.25 MG (50000 UNIT) PO CAPS: 50000.0000 [IU] | ORAL_CAPSULE | ORAL | 0 refills | Status: DC

## 2018-06-26 NOTE — Progress Notes (Signed)
Office: [phone]  /  Fax: [phone]   HPI:   Chief Complaint: OBESITY Sharon Chan is here to discuss her progress with her obesity treatment plan. She is on the Category 1 plan and is following her eating plan approximately 95 to 100 % of the time. She states she is walking 60 minutes 6 times per week. Sharon Chan continues to do well with weight loss. She notes a decrease in hunger and cravings. Sharon Chan is following her category 1 plan well and she has increased exercise to 30 minutes 2 times per day using walking tapes.  Her weight is 226 lb (102.5 kg) today and has had a weight loss of 4 pounds over a period of 2 weeks since her last visit. She has lost 8 lbs since starting treatment with [name].  Vitamin D deficiency Sharon Chan has a diagnosis of vitamin D deficiency. She is stable on vit D and denies nausea, vomiting or muscle weakness.  Diabetes II Sharon Chan has a diagnosis of diabetes type II. Sharon Chan states fasting BGs mostly range between 90 and 100 and denies any hypoglycemic episodes. Last A1c was at 7.4. Sharon Chan is stable on metformin and victoza and she is doing well on diet. She has been working on intensive lifestyle modifications including diet, exercise, and weight loss to help control her blood glucose levels.  ALLERGIES: Allergies  Allergen Reactions  . Penicillins Anaphylaxis    MEDICATIONS: Current Outpatient Medications on File Prior to Visit  Medication Sig Dispense Refill  . acetaminophen (TYLENOL) 325 MG tablet Take 1-2 tablets (325-650 mg total) by mouth every 4 (four) hours as needed for mild pain.    Marland Kitchen aspirin EC 325 MG tablet Take 1 tablet (325 mg total) daily by mouth. 360 tablet 1  . atorvastatin (LIPITOR) 40 MG tablet Take 1 tablet (40 mg total) by mouth daily at 6 PM. 30 tablet 0  . blood glucose meter kit and supplies KIT Dispense based on patient and insurance preference. Use up to four times daily as directed. (FOR ICD-9 250.00, 250.01). 1 each 0  .  buPROPion (WELLBUTRIN XL) 150 MG 24 hr tablet Take 1 tablet (150 mg total) by mouth daily. 90 tablet 1  . cyclobenzaprine (FLEXERIL) 5 MG tablet TAKE 1 TABLET 3 TIMES A DAY AS NEEDED FOR MUSCLE SPASM SHOULDER/NECK PAIN 90 tablet 0  . diphenhydrAMINE (BENADRYL ALLERGY) 25 MG tablet Take 25 mg by mouth every 6 (six) hours as needed.    . gabapentin (NEURONTIN) 300 MG capsule TAKE 1 TABLET IN THE MORNING AND 2 TABLETS AT BEDTIME DAILY 90 capsule 3  . glucose blood test strip Use as instructed 100 each 0  . Insulin Pen Needle (BD PEN NEEDLE NANO 2ND GEN) 32G X 4 MM MISC 1 Package by Does not apply route 2 (two) times daily. 100 each 0  . liraglutide (VICTOZA) 18 MG/3ML SOPN Inject 0.1 mLs (0.6 mg total) into the skin every morning. 1 pen 0  . losartan (COZAAR) 25 MG tablet TAKE 1 TABLET BY MOUTH EVERY DAY 90 tablet 1  . metFORMIN (GLUCOPHAGE) 500 MG tablet TAKE 1 TABLET (500 MG TOTAL) BY MOUTH 2 (TWO) TIMES DAILY WITH A MEAL. 180 tablet 3  . ONETOUCH DELICA LANCETS 00X MISC 1 Package by Does not apply route 2 (two) times daily. 100 each 0  . zolpidem (AMBIEN) 5 MG tablet TAKE 1 TABLET BY MOUTH EVERY DAY AT BEDTIME AS NEEDED FOR SLEEP 30 tablet 0  . topiramate (TOPAMAX) 100 MG tablet Take  1 tablet (100 mg total) by mouth 2 (two) times daily. Take 18m in am and 2060mat night 90 tablet 3   No current facility-administered medications on file prior to visit.     PAST MEDICAL HISTORY: Past Medical History:  Diagnosis Date  . [facility] (ce[facility] vascular accident) ([facility]  . Depression   . DM type 2 (diabetes mellitus, type 2) ([facility]  . HLD (hyperlipidemia)   . HTN (hypertension)   . Joint pain   . Migraines   . Neck pain   . Polycystic disease, ovaries   . Right sided weakness   . Seizures ([facility]  . Shoulder pain   . Sickle cell trait ([facility]  . Thyroid nodule     PAST SURGICAL HISTORY: Past Surgical History:  Procedure Laterality Date  . LAPAROSCOPIC CHOLECYSTECTOMY      SOCIAL  HISTORY: Social History   Tobacco Use  . Smoking status: Never Smoker  . Smokeless tobacco: Never Used  Substance Use Topics  . Alcohol use: No  . Drug use: No    FAMILY HISTORY: Family History  Problem Relation Age of Onset  . Heart attack Mother   . Diabetes Mother   . Hypertension Mother   . Hyperlipidemia Mother   . Post-traumatic stress disorder Father        committed suicide  . Diabetes Father   . Hypertension Sister   . Thyroid cancer Paternal Grandmother     ROS: Review of Systems  Constitutional: Positive for weight loss.  Gastrointestinal: Negative for nausea and vomiting.  Musculoskeletal:       Negative for muscle weakness    PHYSICAL EXAM: Blood pressure (!) 146/95, pulse 93, temperature 98.2 F (36.8 C), temperature source Oral, height _0  (1.575 m), weight 226 lb (102.5 kg), last menstrual period 06/07/2018, SpO2 99 %. Body mass index is 41.34 kg/m. Physical Exam  Constitutional: She is oriented to person, place, and time. She appears well-developed and well-nourished.  Cardiovascular: Normal rate.  Pulmonary/Chest: Effort normal.  Musculoskeletal: Normal range of motion.  Neurological: She is oriented to person, place, and time.  Skin: Skin is warm and dry.  Psychiatric: She has a normal mood and affect. Her behavior is normal.  Vitals reviewed.   RECENT LABS AND TESTS: BMET    Component Value Date/Time   NA 140 05/28/2018 0918   K 4.0 05/28/2018 0918   CL 97 05/28/2018 0918   CO2 29 05/28/2018 0918   GLUCOSE 130 (H) 05/28/2018 0918   GLUCOSE 101 (H) 09/12/2017 1121   BUN 10 05/28/2018 0918   CREATININE 0.84 05/28/2018 0918   CREATININE 0.92 09/12/2017 1121   CALCIUM 9.6 05/28/2018 0918   GFRNONAA 85 05/28/2018 0918   GFRNONAA 71 03/17/2017 1513   GFRAA 98 05/28/2018 0918   GFRAA 82 03/17/2017 1513   Lab Results  Component Value Date   HGBA1C 7.4 (H) 05/28/2018   HGBA1C 6.2 02/06/2018   HGBA1C 5.8 07/25/2017   HGBA1C 6.3 (H)  02/26/2017   Lab Results  Component Value Date   INSULIN 27.1 (H) 05/28/2018   CBC    Component Value Date/Time   WBC 9.3 05/28/2018 0918   WBC 5.3 09/12/2017 1121   RBC 5.50 (H) 05/28/2018 0918   RBC 4.90 09/12/2017 1121   HGB 13.5 05/28/2018 0918   HCT 41.5 05/28/2018 0918   PLT 308 09/12/2017 1121   MCV 76 (L) 05/28/2018 0918   MCH 24.5 (L) 05/28/2018 090569  MCH 25.1 (L) 09/12/2017 1121   MCHC 32.5 05/28/2018 0918   MCHC 32.6 09/12/2017 1121   RDW 18.2 (H) 05/28/2018 0918   LYMPHSABS 2.9 05/28/2018 0918   MONOABS 396 03/17/2017 1513   EOSABS 0.2 05/28/2018 0918   BASOSABS 0.0 05/28/2018 0918   Iron/TIBC/Ferritin/ %Sat    Component Value Date/Time   IRON 59 03/01/2017 2105   TIBC 291 03/01/2017 2105   FERRITIN 47 03/01/2017 2105   IRONPCTSAT 20 03/01/2017 2105   Lipid Panel     Component Value Date/Time   CHOL 185 05/28/2018 0918   TRIG 166 (H) 05/28/2018 0918   HDL 53 05/28/2018 0918   CHOLHDL 2.7 07/25/2017 1130   VLDL 27 02/26/2017 0341   LDLCALC 99 05/28/2018 0918   LDLCALC 70 07/25/2017 1130   Hepatic Function Panel     Component Value Date/Time   PROT 7.0 05/28/2018 0918   ALBUMIN 4.3 05/28/2018 0918   AST 16 05/28/2018 0918   ALT 23 05/28/2018 0918   ALKPHOS 130 (H) 05/28/2018 0918   BILITOT 0.2 05/28/2018 0918      Component Value Date/Time   TSH 1.060 05/28/2018 0918   TSH 0.76 11/30/2017 0954   TSH 0.99 05/24/2017 1629   Results for WALESKA, BUTTERY (MRN [id number]) as of 06/26/2018 15:14  Ref. Range 05/28/2018 09:18  Vitamin D, 25-Hydroxy Latest Ref Range: 30.0 - 100.0 ng/mL 6.9 (L)   ASSESSMENT AND PLAN: Vitamin D deficiency - Plan: Vitamin D, Ergocalciferol, (DRISDOL) 50000 units CAPS capsule  Type 2 diabetes mellitus without complication, without long-term current use of insulin (HCC)  Class 3 severe obesity with serious comorbidity and body mass index (BMI) of 40.0 to 44.9 in adult, unspecified obesity type  ([place])  PLAN:  Vitamin D Deficiency Sharon Chan was informed that low vitamin D levels contributes to fatigue and are associated with obesity, breast, and colon cancer. She agrees to continue to take prescription Vit D _0 ,000 IU every week #4 with no refills and will follow up for routine testing of vitamin D, at least 2-3 times per year. She was informed of the risk of over-replacement of vitamin D and agrees to not increase her dose unless she discusses this with [name] first. Sharon Chan agrees to follow up as directed.  Diabetes II Sharon Chan has been given extensive diabetes education by myself today including ideal fasting and post-prandial blood glucose readings, individual ideal Hgb A1c goals and hypoglycemia prevention. We discussed the importance of good blood sugar control to decrease the likelihood of diabetic complications such as nephropathy, neuropathy, limb loss, blindness, coronary artery disease, and death. We discussed the importance of intensive lifestyle modification including diet, exercise and weight loss as the first line treatment for diabetes. Sharon Chan agrees to continue her diabetes medications, diet and exercise. Sharon Chan will follow up at the agreed upon time.  Obesity Sharon Chan is currently in the action stage of change. As such, her goal is to continue with weight loss efforts She has agreed to follow the Category 1 plan Sharon Chan has been instructed to work up to a goal of 150 minutes of combined cardio and strengthening exercise per week for weight loss and overall health benefits. We discussed the following Behavioral Modification Strategies today: increasing lean protein intake and work on meal planning and easy cooking plans  Sharon Chan has agreed to follow up with our clinic in 2 weeks. She was informed of the importance of frequent follow up visits to maximize her success with intensive lifestyle modifications for her  multiple health conditions.   OBESITY BEHAVIORAL INTERVENTION  VISIT  Today's visit was # 3 out of 22.  Starting weight: 234 lbs Starting date: 05/28/18 Today's weight : 226 lbs Today's date: 06/26/2018 Total lbs lost to date: 8    ASK: We discussed the diagnosis of obesity with Sharon Chan today and Sharon Chan agreed to give [name] permission to discuss obesity behavioral modification therapy today.  ASSESS: Sharon Chan has the diagnosis of obesity and her BMI today is 41.33 Sharon Chan is in the action stage of change   ADVISE: Sharon Chan was educated on the multiple health risks of obesity as well as the benefit of weight loss to improve her health. She was advised of the need for long term treatment and the importance of lifestyle modifications.  AGREE: Multiple dietary modification options and treatment options were discussed and  Sharon Chan agreed to the above obesity treatment plan.  I, Doreene Nest, am acting as transcriptionist for Dennard Nip, MD  I have reviewed the above documentation for accuracy and completeness, and I agree with the above. -Dennard Nip, MD

## 2018-07-02 ENCOUNTER — Other Ambulatory Visit (INDEPENDENT_AMBULATORY_CARE_PROVIDER_SITE_OTHER): Payer: Self-pay | Admitting: Family Medicine

## 2018-07-02 DIAGNOSIS — E559 Vitamin D deficiency, unspecified: Secondary | ICD-10-CM

## 2018-07-08 ENCOUNTER — Other Ambulatory Visit (INDEPENDENT_AMBULATORY_CARE_PROVIDER_SITE_OTHER): Payer: Self-pay | Admitting: Family Medicine

## 2018-07-08 DIAGNOSIS — E119 Type 2 diabetes mellitus without complications: Secondary | ICD-10-CM

## 2018-07-09 ENCOUNTER — Other Ambulatory Visit (INDEPENDENT_AMBULATORY_CARE_PROVIDER_SITE_OTHER): Payer: Self-pay | Admitting: Family Medicine

## 2018-07-09 DIAGNOSIS — E119 Type 2 diabetes mellitus without complications: Secondary | ICD-10-CM

## 2018-07-10 ENCOUNTER — Ambulatory Visit (INDEPENDENT_AMBULATORY_CARE_PROVIDER_SITE_OTHER): Payer: Medicare Other | Admitting: Physician Assistant

## 2018-07-10 VITALS — BP 148/88 | HR 103 | Temp 98.5°F | Ht 62.0 in | Wt 224.0 lb

## 2018-07-10 DIAGNOSIS — E119 Type 2 diabetes mellitus without complications: Secondary | ICD-10-CM | POA: Diagnosis not present

## 2018-07-10 DIAGNOSIS — E559 Vitamin D deficiency, unspecified: Secondary | ICD-10-CM

## 2018-07-10 DIAGNOSIS — Z6841 Body Mass Index (BMI) 40.0 and over, adult: Secondary | ICD-10-CM

## 2018-07-10 MED ORDER — LIRAGLUTIDE 18 MG/3ML ~~LOC~~ SOPN: 0.6000 mg | PEN_INJECTOR | SUBCUTANEOUS | 0 refills | Status: DC

## 2018-07-10 MED ORDER — VITAMIN D (ERGOCALCIFEROL) 1.25 MG (50000 UNIT) PO CAPS: 50000.0000 [IU] | ORAL_CAPSULE | ORAL | 0 refills | Status: DC

## 2018-07-11 NOTE — Progress Notes (Signed)
Office: [phone]  /  Fax: [phone]   HPI:   Chief Complaint: OBESITY Sharon Chan is here to discuss her progress with her obesity treatment plan. She is on the Category 1 plan and is following her eating plan approximately 98 % of the time. She states she is walking 60 minutes 6 times per week. Sharon Chan did very well with weight loss. She reports enjoying the Category 1 plan and finds it easy to follow. She is traveling for [place] and is already meal planning.   Her weight is 224 lb (101.6 kg) today and has had a weight loss of 2 pounds over a period of 2 weeks since her last visit. She has lost 10 lbs since starting treatment with [name].  Diabetes II Sharon Chan has a diagnosis of diabetes type II. She notes a decrease in her appetite with Victoza.  Sharon Chan states BGs have been labile ranging between 68 and 113 and denies any hypoglycemic episodes. She is on Metformin and Victoza. Last A1c was 7.4. She has been working on intensive lifestyle modifications including diet, exercise, and weight loss to help control her blood glucose levels.   Vitamin D deficiency Sharon Chan has a diagnosis of vitamin D deficiency. She is currently taking vit D and denies nausea, vomiting or muscle weakness.   ALLERGIES: Allergies  Allergen Reactions  . Penicillins Anaphylaxis    MEDICATIONS: Current Outpatient Medications on File Prior to Visit  Medication Sig Dispense Refill  . acetaminophen (TYLENOL) 325 MG tablet Take 1-2 tablets (325-650 mg total) by mouth every 4 (four) hours as needed for mild pain.    Marland Kitchen aspirin EC 325 MG tablet Take 1 tablet (325 mg total) daily by mouth. 360 tablet 1  . atorvastatin (LIPITOR) 40 MG tablet Take 1 tablet (40 mg total) by mouth daily at 6 PM. 30 tablet 0  . blood glucose meter kit and supplies KIT Dispense based on patient and insurance preference. Use up to four times daily as directed. (FOR ICD-9 250.00, 250.01). 1 each 0  . buPROPion (WELLBUTRIN XL) 150 MG 24 hr  tablet Take 1 tablet (150 mg total) by mouth daily. 90 tablet 1  . cyclobenzaprine (FLEXERIL) 5 MG tablet TAKE 1 TABLET 3 TIMES A DAY AS NEEDED FOR MUSCLE SPASM SHOULDER/NECK PAIN 90 tablet 0  . diphenhydrAMINE (BENADRYL ALLERGY) 25 MG tablet Take 25 mg by mouth every 6 (six) hours as needed.    . gabapentin (NEURONTIN) 300 MG capsule TAKE 1 TABLET IN THE MORNING AND 2 TABLETS AT BEDTIME DAILY 90 capsule 3  . glucose blood test strip Use as instructed 100 each 0  . Insulin Pen Needle (BD PEN NEEDLE NANO 2ND GEN) 32G X 4 MM MISC 1 Package by Does not apply route 2 (two) times daily. 100 each 0  . losartan (COZAAR) 25 MG tablet TAKE 1 TABLET BY MOUTH EVERY DAY 90 tablet 1  . metFORMIN (GLUCOPHAGE) 500 MG tablet TAKE 1 TABLET (500 MG TOTAL) BY MOUTH 2 (TWO) TIMES DAILY WITH A MEAL. 180 tablet 3  . ONETOUCH DELICA LANCETS 65B MISC 1 Package by Does not apply route 2 (two) times daily. 100 each 0  . topiramate (TOPAMAX) 100 MG tablet Take 1 tablet (100 mg total) by mouth 2 (two) times daily. Take 176m in am and 2061mat night 90 tablet 3  . zolpidem (AMBIEN) 5 MG tablet TAKE 1 TABLET BY MOUTH EVERY DAY AT BEDTIME AS NEEDED FOR SLEEP 30 tablet 0   No current  facility-administered medications on file prior to visit.     PAST MEDICAL HISTORY: Past Medical History:  Diagnosis Date  . CVA (cerebral vascular accident) ([place])   . Depression   . DM type 2 (diabetes mellitus, type 2) ([place])   . HLD (hyperlipidemia)   . HTN (hypertension)   . Joint pain   . Migraines   . Neck pain   . Polycystic disease, ovaries   . Right sided weakness   . Seizures ([place])   . Shoulder pain   . Sickle cell trait ([place])   . Thyroid nodule     PAST SURGICAL HISTORY: Past Surgical History:  Procedure Laterality Date  . LAPAROSCOPIC CHOLECYSTECTOMY      SOCIAL HISTORY: Social History   Tobacco Use  . Smoking status: Never Smoker  . Smokeless tobacco: Never Used  Substance Use Topics  . Alcohol use: No  .  Drug use: No    FAMILY HISTORY: Family History  Problem Relation Age of Onset  . Heart attack Mother   . Diabetes Mother   . Hypertension Mother   . Hyperlipidemia Mother   . Post-traumatic stress disorder Father        committed suicide  . Diabetes Father   . Hypertension Sister   . Thyroid cancer Paternal Grandmother     ROS: Review of Systems  Constitutional: Positive for weight loss.  Gastrointestinal: Negative for nausea and vomiting.  Musculoskeletal:       Negative for muscle weakness.  Endo/Heme/Allergies:       Negative for hypoglycemia.    PHYSICAL EXAM: Blood pressure (!) 148/88, pulse (!) 103, temperature 98.5 F (36.9 C), temperature source Oral, height 5' 2"  (1.575 m), weight 224 lb (101.6 kg), last menstrual period 07/09/2018, SpO2 96 %. Body mass index is 40.97 kg/m. Physical Exam  Constitutional: She is oriented to person, place, and time. She appears well-developed and well-nourished.  Cardiovascular: Normal rate.  Pulmonary/Chest: Effort normal.  Musculoskeletal: Normal range of motion.  Neurological: She is oriented to person, place, and time.  Skin: Skin is warm and dry.  Psychiatric: She has a normal mood and affect. Her behavior is normal.  Vitals reviewed.   RECENT LABS AND TESTS: BMET    Component Value Date/Time   NA 140 05/28/2018 0918   K 4.0 05/28/2018 0918   CL 97 05/28/2018 0918   CO2 29 05/28/2018 0918   GLUCOSE 130 (H) 05/28/2018 0918   GLUCOSE 101 (H) 09/12/2017 1121   BUN 10 05/28/2018 0918   CREATININE 0.84 05/28/2018 0918   CREATININE 0.92 09/12/2017 1121   CALCIUM 9.6 05/28/2018 0918   GFRNONAA 85 05/28/2018 0918   GFRNONAA 71 03/17/2017 1513   GFRAA 98 05/28/2018 0918   GFRAA 82 03/17/2017 1513   Lab Results  Component Value Date   HGBA1C 7.4 (H) 05/28/2018   HGBA1C 6.2 02/06/2018   HGBA1C 5.8 07/25/2017   HGBA1C 6.3 (H) 02/26/2017   Lab Results  Component Value Date   INSULIN 27.1 (H) 05/28/2018   CBC      Component Value Date/Time   WBC 9.3 05/28/2018 0918   WBC 5.3 09/12/2017 1121   RBC 5.50 (H) 05/28/2018 0918   RBC 4.90 09/12/2017 1121   HGB 13.5 05/28/2018 0918   HCT 41.5 05/28/2018 0918   PLT 308 09/12/2017 1121   MCV 76 (L) 05/28/2018 0918   MCH 24.5 (L) 05/28/2018 0918   MCH 25.1 (L) 09/12/2017 1121   MCHC 32.5 05/28/2018 6659  MCHC 32.6 09/12/2017 1121   RDW 18.2 (H) 05/28/2018 0918   LYMPHSABS 2.9 05/28/2018 0918   MONOABS 396 03/17/2017 1513   EOSABS 0.2 05/28/2018 0918   BASOSABS 0.0 05/28/2018 0918   Iron/TIBC/Ferritin/ %Sat    Component Value Date/Time   IRON 59 03/01/2017 2105   TIBC 291 03/01/2017 2105   FERRITIN 47 03/01/2017 2105   IRONPCTSAT 20 03/01/2017 2105   Lipid Panel     Component Value Date/Time   CHOL 185 05/28/2018 0918   TRIG 166 (H) 05/28/2018 0918   HDL 53 05/28/2018 0918   CHOLHDL 2.7 07/25/2017 1130   VLDL 27 02/26/2017 0341   LDLCALC 99 05/28/2018 0918   LDLCALC 70 07/25/2017 1130   Hepatic Function Panel     Component Value Date/Time   PROT 7.0 05/28/2018 0918   ALBUMIN 4.3 05/28/2018 0918   AST 16 05/28/2018 0918   ALT 23 05/28/2018 0918   ALKPHOS 130 (H) 05/28/2018 0918   BILITOT 0.2 05/28/2018 0918      Component Value Date/Time   TSH 1.060 05/28/2018 0918   TSH 0.76 11/30/2017 0954   TSH 0.99 05/24/2017 1629   Results for MAGIN, BALBI (MRN [id number]) as of 07/11/2018 09:52  Ref. Range 05/28/2018 09:18  Vitamin D, 25-Hydroxy Latest Ref Range: 30.0 - 100.0 ng/mL 6.9 (L)   ASSESSMENT AND PLAN: Type 2 diabetes mellitus without complication, without long-term current use of insulin (HCC) - Plan: liraglutide (VICTOZA) 18 MG/3ML SOPN  Vitamin D deficiency - Plan: Vitamin D, Ergocalciferol, (DRISDOL) 50000 units CAPS capsule  Class 3 severe obesity with serious comorbidity and body mass index (BMI) of 40.0 to 44.9 in adult, unspecified obesity type ([place])  PLAN:  Diabetes II Sharon Chan has been given extensive  diabetes education by myself today including ideal fasting and post-prandial blood glucose readings, individual ideal Hgb A1c goals  and hypoglycemia prevention. We discussed the importance of good blood sugar control to decrease the likelihood of diabetic complications such as nephropathy, neuropathy, limb loss, blindness, coronary artery disease, and death. We discussed the importance of intensive lifestyle modification including diet, exercise and weight loss as the first line treatment for diabetes. Skyelyn agrees to continue Victoza #1 pen with no refills and will follow up in 2 weeks.  Vitamin D Deficiency Sharon Chan was informed that low vitamin D levels contributes to fatigue and are associated with obesity, breast, and colon cancer. She agrees to continue to take prescription Vit D @50 ,000 IU every week #4 with no refills and will follow up for routine testing of vitamin D, at least 2-3 times per year. She was informed of the risk of over-replacement of vitamin D and agrees to not increase her dose unless she discusses this with [name] first. She agrees to follow up in 2 weeks.  Obesity Sharon Chan is currently in the action stage of change. As such, her goal is to continue with weight loss efforts. She has agreed to follow the Category 1 plan. Sharon Chan has been instructed to work up to a goal of 150 minutes of combined cardio and strengthening exercise per week for weight loss and overall health benefits. We discussed the following Behavioral Modification Strategies today: work on meal planning and easy cooking plans and planning for success.  Kierstan has agreed to follow up with our clinic in 2 weeks. She was informed of the importance of frequent follow up visits to maximize her success with intensive lifestyle modifications for her multiple health conditions.   OBESITY BEHAVIORAL  INTERVENTION VISIT  Today's visit was # 4  Starting weight: 234 Starting date: 05/28/18 Today's weight : 224 lb  (101.6 kg)  Today's date: 07/10/2018 Total lbs lost to date: 10 At least 15 minutes were spent on discussing the following behavioral intervention visit.   ASK: We discussed the diagnosis of obesity with Sharon Chan today and Sharon Chan agreed to give [name] permission to discuss obesity behavioral modification therapy today.  ASSESS: Sharon Chan has the diagnosis of obesity and her BMI today is 40.96 Sharon Chan is in the action stage of change.   ADVISE: Sharon Chan was educated on the multiple health risks of obesity as well as the benefit of weight loss to improve her health. She was advised of the need for long term treatment and the importance of lifestyle modifications to improve her current health and to decrease her risk of future health problems.  AGREE: Multiple dietary modification options and treatment options were discussed and Sharon Chan agreed to follow the recommendations documented in the above note.  ARRANGE: Sharon Chan was educated on the importance of frequent visits to treat obesity as outlined per CMS and USPSTF guidelines and agreed to schedule her next follow up appointment today.  Lenward Chancellor, am acting as transcriptionist for Abby Potash, PA-C I, Abby Potash, PA-C have reviewed above note and agree with its content

## 2018-07-24 ENCOUNTER — Other Ambulatory Visit: Payer: Self-pay | Admitting: Family Medicine

## 2018-07-24 ENCOUNTER — Ambulatory Visit (INDEPENDENT_AMBULATORY_CARE_PROVIDER_SITE_OTHER): Payer: Medicare Other | Admitting: Physician Assistant

## 2018-07-24 VITALS — BP 145/93 | HR 96 | Temp 97.9°F | Ht 62.0 in | Wt 220.0 lb

## 2018-07-24 DIAGNOSIS — E559 Vitamin D deficiency, unspecified: Secondary | ICD-10-CM | POA: Diagnosis not present

## 2018-07-24 DIAGNOSIS — E119 Type 2 diabetes mellitus without complications: Secondary | ICD-10-CM | POA: Diagnosis not present

## 2018-07-24 DIAGNOSIS — Z6841 Body Mass Index (BMI) 40.0 and over, adult: Secondary | ICD-10-CM

## 2018-07-24 MED ORDER — LIRAGLUTIDE 18 MG/3ML ~~LOC~~ SOPN: 0.6000 mg | PEN_INJECTOR | SUBCUTANEOUS | 0 refills | Status: DC

## 2018-07-24 MED ORDER — VITAMIN D (ERGOCALCIFEROL) 1.25 MG (50000 UNIT) PO CAPS: 50000.0000 [IU] | ORAL_CAPSULE | ORAL | 0 refills | Status: DC

## 2018-07-24 NOTE — Telephone Encounter (Signed)
Medication sent to pharmacy for a 30 day supply until patient comes in for office visit

## 2018-07-25 ENCOUNTER — Other Ambulatory Visit (INDEPENDENT_AMBULATORY_CARE_PROVIDER_SITE_OTHER): Payer: Self-pay | Admitting: Family Medicine

## 2018-07-25 DIAGNOSIS — E119 Type 2 diabetes mellitus without complications: Secondary | ICD-10-CM

## 2018-07-25 NOTE — Progress Notes (Signed)
Office: [phone]  /  Fax: [phone]   HPI:   Chief Complaint: OBESITY Sharon Chan is here to discuss her progress with her obesity treatment plan. She is on the Category 1 plan and is following her eating plan approximately 75 % of the time. She states she is walking for 60 minutes 6 times per week. Sharon Chan did very well with weight loss. She is following the plan very closely. She would like to start lifting light weights.  Her weight is 220 lb (99.8 kg) today and has had a weight loss of 4 pounds over a period of 2 weeks since her last visit. She has lost 14 lbs since starting treatment with [name].  Vitamin D Deficiency Sharon Chan has a diagnosis of vitamin D deficiency. She is on prescription Vit D and denies nausea, vomiting or muscle weakness.  Diabetes II Sharon Chan has a diagnosis of diabetes type II. Sharon Chan states fasting BGs range between 72 and 120 and she denies hypoglycemia or polyphagia. Last A1c was 7.4. She has been working on intensive lifestyle modifications including diet, exercise, and weight loss to help control her blood glucose levels.  ALLERGIES: Allergies  Allergen Reactions  . Penicillins Anaphylaxis    MEDICATIONS: Current Outpatient Medications on File Prior to Visit  Medication Sig Dispense Refill  . acetaminophen (TYLENOL) 325 MG tablet Take 1-2 tablets (325-650 mg total) by mouth every 4 (four) hours as needed for mild pain.    Marland Kitchen aspirin EC 325 MG tablet Take 1 tablet (325 mg total) daily by mouth. 360 tablet 1  . atorvastatin (LIPITOR) 40 MG tablet Take 1 tablet (40 mg total) by mouth daily at 6 PM. 30 tablet 0  . blood glucose meter kit and supplies KIT Dispense based on patient and insurance preference. Use up to four times daily as directed. (FOR ICD-9 250.00, 250.01). 1 each 0  . buPROPion (WELLBUTRIN XL) 150 MG 24 hr tablet Take 1 tablet (150 mg total) by mouth daily. 90 tablet 1  . cyclobenzaprine (FLEXERIL) 5 MG tablet TAKE 1 TABLET 3 TIMES A DAY AS  NEEDED FOR MUSCLE SPASM SHOULDER/NECK PAIN 90 tablet 0  . diphenhydrAMINE (BENADRYL ALLERGY) 25 MG tablet Take 25 mg by mouth every 6 (six) hours as needed.    . gabapentin (NEURONTIN) 300 MG capsule TAKE 1 TABLET IN THE MORNING AND 2 TABLETS AT BEDTIME DAILY 90 capsule 3  . glucose blood test strip Use as instructed 100 each 0  . Insulin Pen Needle (BD PEN NEEDLE NANO 2ND GEN) 32G X 4 MM MISC 1 Package by Does not apply route 2 (two) times daily. 100 each 0  . metFORMIN (GLUCOPHAGE) 500 MG tablet TAKE 1 TABLET (500 MG TOTAL) BY MOUTH 2 (TWO) TIMES DAILY WITH A MEAL. 180 tablet 3  . ONETOUCH DELICA LANCETS 17O MISC 1 Package by Does not apply route 2 (two) times daily. 100 each 0  . zolpidem (AMBIEN) 5 MG tablet TAKE 1 TABLET BY MOUTH EVERY DAY AT BEDTIME AS NEEDED FOR SLEEP 30 tablet 0  . topiramate (TOPAMAX) 100 MG tablet Take 1 tablet (100 mg total) by mouth 2 (two) times daily. Take '100mg'$  in am and '200mg'$  at night 90 tablet 3   No current facility-administered medications on file prior to visit.     PAST MEDICAL HISTORY: Past Medical History:  Diagnosis Date  . CVA (cerebral vascular accident) ([place])   . Depression   . DM type 2 (diabetes mellitus, type 2) ([place])   .  HLD (hyperlipidemia)   . HTN (hypertension)   . Joint pain   . Migraines   . Neck pain   . Polycystic disease, ovaries   . Right sided weakness   . Seizures ([place])   . Shoulder pain   . Sickle cell trait ([place])   . Thyroid nodule     PAST SURGICAL HISTORY: Past Surgical History:  Procedure Laterality Date  . LAPAROSCOPIC CHOLECYSTECTOMY      SOCIAL HISTORY: Social History   Tobacco Use  . Smoking status: Never Smoker  . Smokeless tobacco: Never Used  Substance Use Topics  . Alcohol use: No  . Drug use: No    FAMILY HISTORY: Family History  Problem Relation Age of Onset  . Heart attack Mother   . Diabetes Mother   . Hypertension Mother   . Hyperlipidemia Mother   . Post-traumatic stress disorder  Father        committed suicide  . Diabetes Father   . Hypertension Sister   . Thyroid cancer Paternal Grandmother     ROS: Review of Systems  Constitutional: Positive for weight loss.  Gastrointestinal: Negative for nausea and vomiting.  Musculoskeletal:       Negative muscle weakness  Endo/Heme/Allergies:       Negative hypoglycemia Negative polyphagia    PHYSICAL EXAM: Blood pressure (!) 145/93, pulse 96, temperature 97.9 F (36.6 C), temperature source Oral, height 5' 2" (1.575 m), weight 220 lb (99.8 kg), last menstrual period 07/09/2018, SpO2 98 %. Body mass index is 40.24 kg/m. Physical Exam  Constitutional: She is oriented to person, place, and time. She appears well-developed and well-nourished.  Cardiovascular: Normal rate.  Pulmonary/Chest: Effort normal.  Musculoskeletal: Normal range of motion.  Neurological: She is oriented to person, place, and time.  Skin: Skin is warm and dry.  Psychiatric: She has a normal mood and affect. Her behavior is normal.  Vitals reviewed.   RECENT LABS AND TESTS: BMET    Component Value Date/Time   NA 140 05/28/2018 0918   K 4.0 05/28/2018 0918   CL 97 05/28/2018 0918   CO2 29 05/28/2018 0918   GLUCOSE 130 (H) 05/28/2018 0918   GLUCOSE 101 (H) 09/12/2017 1121   BUN 10 05/28/2018 0918   CREATININE 0.84 05/28/2018 0918   CREATININE 0.92 09/12/2017 1121   CALCIUM 9.6 05/28/2018 0918   GFRNONAA 85 05/28/2018 0918   GFRNONAA 71 03/17/2017 1513   GFRAA 98 05/28/2018 0918   GFRAA 82 03/17/2017 1513   Lab Results  Component Value Date   HGBA1C 7.4 (H) 05/28/2018   HGBA1C 6.2 02/06/2018   HGBA1C 5.8 07/25/2017   HGBA1C 6.3 (H) 02/26/2017   Lab Results  Component Value Date   INSULIN 27.1 (H) 05/28/2018   CBC    Component Value Date/Time   WBC 9.3 05/28/2018 0918   WBC 5.3 09/12/2017 1121   RBC 5.50 (H) 05/28/2018 0918   RBC 4.90 09/12/2017 1121   HGB 13.5 05/28/2018 0918   HCT 41.5 05/28/2018 0918   PLT 308  09/12/2017 1121   MCV 76 (L) 05/28/2018 0918   MCH 24.5 (L) 05/28/2018 0918   MCH 25.1 (L) 09/12/2017 1121   MCHC 32.5 05/28/2018 0918   MCHC 32.6 09/12/2017 1121   RDW 18.2 (H) 05/28/2018 0918   LYMPHSABS 2.9 05/28/2018 0918   MONOABS 396 03/17/2017 1513   EOSABS 0.2 05/28/2018 0918   BASOSABS 0.0 05/28/2018 0918   Iron/TIBC/Ferritin/ %Sat    Component Value Date/Time  IRON 59 03/01/2017 2105   TIBC 291 03/01/2017 2105   FERRITIN 47 03/01/2017 2105   IRONPCTSAT 20 03/01/2017 2105   Lipid Panel     Component Value Date/Time   CHOL 185 05/28/2018 0918   TRIG 166 (H) 05/28/2018 0918   HDL 53 05/28/2018 0918   CHOLHDL 2.7 07/25/2017 1130   VLDL 27 02/26/2017 0341   LDLCALC 99 05/28/2018 0918   LDLCALC 70 07/25/2017 1130   Hepatic Function Panel     Component Value Date/Time   PROT 7.0 05/28/2018 0918   ALBUMIN 4.3 05/28/2018 0918   AST 16 05/28/2018 0918   ALT 23 05/28/2018 0918   ALKPHOS 130 (H) 05/28/2018 0918   BILITOT 0.2 05/28/2018 0918      Component Value Date/Time   TSH 1.060 05/28/2018 0918   TSH 0.76 11/30/2017 0954   TSH 0.99 05/24/2017 1629  Results for ABRIA, VANNOSTRAND (MRN [id number]) as of 07/25/2018 08:29  Ref. Range 05/28/2018 09:18  Vitamin D, 25-Hydroxy Latest Ref Range: 30.0 - 100.0 ng/mL 6.9 (L)    ASSESSMENT AND PLAN: Vitamin D deficiency - Plan: Vitamin D, Ergocalciferol, (DRISDOL) 50000 units CAPS capsule  Type 2 diabetes mellitus without complication, without long-term current use of insulin (HCC) - Plan: liraglutide (VICTOZA) 18 MG/3ML SOPN  Class 3 severe obesity with serious comorbidity and body mass index (BMI) of 40.0 to 44.9 in adult, unspecified obesity type ([place])  PLAN:  Vitamin D Deficiency Sharon Chan was informed that low vitamin D levels contributes to fatigue and are associated with obesity, breast, and colon cancer. Sharon Chan agrees to continue taking prescription Vit D '@50'$ ,000 IU every week #4 and we will refill for 1  month. She will follow up for routine testing of vitamin D, at least 2-3 times per year. She was informed of the risk of over-replacement of vitamin D and agrees to not increase her dose unless she discusses this with [name] first. Sharon Chan agrees to follow up with our clinic in 2 weeks.  Diabetes II Sharon Chan has been given extensive diabetes education by myself today including ideal fasting and post-prandial blood glucose readings, individual ideal Hgb A1c goals and hypoglycemia prevention. We discussed the importance of good blood sugar control to decrease the likelihood of diabetic complications such as nephropathy, neuropathy, limb loss, blindness, coronary artery disease, and death. We discussed the importance of intensive lifestyle modification including diet, exercise and weight loss as the first line treatment for diabetes. Sharon Chan agrees to continue Victoza 0.6 mg q AM #1 pen and we will refill for 1 month. Sharon Chan agrees to follow up with our clinic in 2 weeks.  Obesity Sharon Chan is currently in the action stage of change. As such, her goal is to continue with weight loss efforts She has agreed to follow the Category 1 plan Sharon Chan has been instructed to work up to a goal of 150 minutes of combined cardio and strengthening exercise per week for weight loss and overall health benefits. We discussed the following Behavioral Modification Strategies today: work on meal planning and easy cooking plans and no skipping meals   Sharon Chan has agreed to follow up with our clinic in 2 weeks. She was informed of the importance of frequent follow up visits to maximize her success with intensive lifestyle modifications for her multiple health conditions.   OBESITY BEHAVIORAL INTERVENTION VISIT  Today's visit was # 6  Starting weight: 234 lbs Starting date: 05/28/18 Today's weight : 220 lbs Today's date: 07/24/2018 Total lbs lost to date:  14 At least 15 minutes were spent on discussing the following  behavioral intervention visit.   ASK: We discussed the diagnosis of obesity with Sharon Chan today and Sharon Chan agreed to give [name] permission to discuss obesity behavioral modification therapy today.  ASSESS: Sharon Chan has the diagnosis of obesity and her BMI today is 40.23 Sharon Chan is in the action stage of change   ADVISE: Sharon Chan was educated on the multiple health risks of obesity as well as the benefit of weight loss to improve her health. She was advised of the need for long term treatment and the importance of lifestyle modifications.  AGREE: Multiple dietary modification options and treatment options were discussed and  Sharon Chan agreed to the above obesity treatment plan.  Wilhemena Durie, am acting as transcriptionist for Abby Potash, PA-C I, Abby Potash, PA-C have reviewed above note and agree with its content

## 2018-08-01 ENCOUNTER — Other Ambulatory Visit: Payer: Self-pay | Admitting: Family Medicine

## 2018-08-05 ENCOUNTER — Other Ambulatory Visit: Payer: Self-pay | Admitting: Internal Medicine

## 2018-08-07 ENCOUNTER — Ambulatory Visit (INDEPENDENT_AMBULATORY_CARE_PROVIDER_SITE_OTHER): Payer: Medicare Other | Admitting: Physician Assistant

## 2018-08-07 VITALS — BP 130/78 | HR 112 | Temp 98.0°F | Ht 62.0 in | Wt 216.0 lb

## 2018-08-07 DIAGNOSIS — Z6839 Body mass index (BMI) 39.0-39.9, adult: Secondary | ICD-10-CM | POA: Diagnosis not present

## 2018-08-07 DIAGNOSIS — E559 Vitamin D deficiency, unspecified: Secondary | ICD-10-CM

## 2018-08-07 DIAGNOSIS — E119 Type 2 diabetes mellitus without complications: Secondary | ICD-10-CM | POA: Diagnosis not present

## 2018-08-07 MED ORDER — LIRAGLUTIDE 18 MG/3ML ~~LOC~~ SOPN: 0.6000 mg | PEN_INJECTOR | SUBCUTANEOUS | 0 refills | Status: DC

## 2018-08-07 MED ORDER — VITAMIN D (ERGOCALCIFEROL) 1.25 MG (50000 UNIT) PO CAPS: 50000.0000 [IU] | ORAL_CAPSULE | ORAL | 0 refills | Status: DC

## 2018-08-08 ENCOUNTER — Other Ambulatory Visit: Payer: Self-pay

## 2018-08-08 DIAGNOSIS — I6930 Unspecified sequelae of cerebral infarction: Secondary | ICD-10-CM

## 2018-08-08 NOTE — Progress Notes (Signed)
Office: [phone]  /  Fax: [phone]   HPI:   Chief Complaint: OBESITY Sharon Chan is here to discuss her progress with her obesity treatment plan. She is on the Category 1 plan and is following her eating plan approximately 100 % of the time. She states she is walking for 60 minutes 5 times per week. Sharon Chan did very well with weight loss. She has followed the plan closely and is also adding in walking throughout the day. She has also signed up for [employer] and she is working out at home doing yoga.  Her weight is 216 lb (98 kg) today and has had a weight loss of 4 pounds over a period of 2 weeks since her last visit. She has lost 18 lbs since starting treatment with [name].  Vitamin D Deficiency Sharon Chan has a diagnosis of vitamin D deficiency. She is on prescription Vit D and denies nausea, vomiting or muscle weakness.  Diabetes II Sharon Chan has a diagnosis of diabetes type II. Sharon Chan states fasting BGs range between 90 and 110, she denies hypoglycemia or polyphagia. Last A1c was 7.4. She has been working on intensive lifestyle modifications including diet, exercise, and weight loss to help control her blood glucose levels.  ALLERGIES: Allergies  Allergen Reactions  . Penicillins Anaphylaxis    MEDICATIONS: Current Outpatient Medications on File Prior to Visit  Medication Sig Dispense Refill  . acetaminophen (TYLENOL) 325 MG tablet Take 1-2 tablets (325-650 mg total) by mouth every 4 (four) hours as needed for mild pain.    Marland Kitchen aspirin EC 325 MG tablet Take 1 tablet (325 mg total) daily by mouth. 360 tablet 1  . atorvastatin (LIPITOR) 40 MG tablet Take 1 tablet (40 mg total) by mouth daily at 6 PM. 30 tablet 0  . blood glucose meter kit and supplies KIT Dispense based on patient and insurance preference. Use up to four times daily as directed. (FOR ICD-9 250.00, 250.01). 1 each 0  . buPROPion (WELLBUTRIN XL) 150 MG 24 hr tablet TAKE 1 TABLET BY MOUTH EVERY DAY (Patient not taking:  Reported on 08/07/2018) 30 tablet 0  . cyclobenzaprine (FLEXERIL) 5 MG tablet TAKE 1 TABLET 3 TIMES A DAY AS NEEDED FOR MUSCLE SPASM SHOULDER/NECK PAIN (Patient not taking: Reported on 08/07/2018) 90 tablet 0  . diphenhydrAMINE (BENADRYL ALLERGY) 25 MG tablet Take 25 mg by mouth every 6 (six) hours as needed.    . gabapentin (NEURONTIN) 300 MG capsule TAKE 1 TABLET IN THE MORNING AND 2 TABLETS AT BEDTIME DAILY (Patient not taking: Take 1 tablet in the morning and 2 tablets at bedtime daily) 90 capsule 3  . glucose blood (ONE TOUCH ULTRA TEST) test strip TEST TWICE DAILY (Patient not taking: Reported on 08/07/2018) 100 each 0  . Insulin Pen Needle (BD PEN NEEDLE NANO 2ND GEN) 32G X 4 MM MISC 1 Package by Does not apply route 2 (two) times daily. (Patient not taking: Reported on 08/07/2018) 100 each 0  . losartan (COZAAR) 25 MG tablet TAKE 1 TABLET BY MOUTH EVERY DAY (Patient not taking: Reported on 08/07/2018) 30 tablet 0  . metFORMIN (GLUCOPHAGE) 500 MG tablet TAKE 1 TABLET (500 MG TOTAL) BY MOUTH 2 (TWO) TIMES DAILY WITH A MEAL. (Patient not taking: Reported on 08/07/2018) 180 tablet 3  . ONETOUCH DELICA LANCETS 61Y MISC 1 Package by Does not apply route 2 (two) times daily. (Patient not taking: Reported on 08/07/2018) 100 each 0  . topiramate (TOPAMAX) 100 MG tablet Take 1 tablet (  100 mg total) by mouth 2 (two) times daily. Take 153m in am and 2082mat night 90 tablet 3  . zolpidem (AMBIEN) 5 MG tablet TAKE 1 TABLET BY MOUTH EVERY DAY AT BEDTIME AS NEEDED FOR SLEEP (Patient not taking: Reported on 08/07/2018) 30 tablet 0   No current facility-administered medications on file prior to visit.     PAST MEDICAL HISTORY: Past Medical History:  Diagnosis Date  . CVA (cerebral vascular accident) ([facility]  . Depression   . DM type 2 (diabetes mellitus, type 2) ([facility]  . HLD (hyperlipidemia)   . HTN (hypertension)   . Joint pain   . Migraines   . Neck pain   . Polycystic disease, ovaries   . Right sided  weakness   . Seizures ([facility]  . Shoulder pain   . Sickle cell trait ([facility]  . Thyroid nodule     PAST SURGICAL HISTORY: Past Surgical History:  Procedure Laterality Date  . LAPAROSCOPIC CHOLECYSTECTOMY      SOCIAL HISTORY: Social History   Tobacco Use  . Smoking status: Never Smoker  . Smokeless tobacco: Never Used  Substance Use Topics  . Alcohol use: No  . Drug use: No    FAMILY HISTORY: Family History  Problem Relation Age of Onset  . Heart attack Mother   . Diabetes Mother   . Hypertension Mother   . Hyperlipidemia Mother   . Post-traumatic stress disorder Father        committed suicide  . Diabetes Father   . Hypertension Sister   . Thyroid cancer Paternal Grandmother     ROS: Review of Systems  Constitutional: Positive for weight loss.  Gastrointestinal: Negative for nausea and vomiting.  Musculoskeletal:       Negative muscle weakness  Endo/Heme/Allergies:       Negative hypoglycemia Negative polyphagia    PHYSICAL EXAM: Blood pressure 130/78, pulse (!) 112, temperature 98 F (36.7 C), temperature source Oral, height _0  (1.575 m), weight 216 lb (98 kg), last menstrual period 07/09/2018, SpO2 98 %. Body mass index is 39.51 kg/m. Physical Exam  Constitutional: She is oriented to person, place, and time. She appears well-developed and well-nourished.  Cardiovascular: Normal rate.  Pulmonary/Chest: Effort normal.  Musculoskeletal: Normal range of motion.  Neurological: She is oriented to person, place, and time.  Skin: Skin is warm and dry.  Psychiatric: She has a normal mood and affect. Her behavior is normal.  Vitals reviewed.   RECENT LABS AND TESTS: BMET    Component Value Date/Time   NA 140 05/28/2018 0918   K 4.0 05/28/2018 0918   CL 97 05/28/2018 0918   CO2 29 05/28/2018 0918   GLUCOSE 130 (H) 05/28/2018 0918   GLUCOSE 101 (H) 09/12/2017 1121   BUN 10 05/28/2018 0918   CREATININE 0.84 05/28/2018 0918   CREATININE 0.92  09/12/2017 1121   CALCIUM 9.6 05/28/2018 0918   GFRNONAA 85 05/28/2018 0918   GFRNONAA 71 03/17/2017 1513   GFRAA 98 05/28/2018 0918   GFRAA 82 03/17/2017 1513   Lab Results  Component Value Date   HGBA1C 7.4 (H) 05/28/2018   HGBA1C 6.2 02/06/2018   HGBA1C 5.8 07/25/2017   HGBA1C 6.3 (H) 02/26/2017   Lab Results  Component Value Date   INSULIN 27.1 (H) 05/28/2018   CBC    Component Value Date/Time   WBC 9.3 05/28/2018 0918   WBC 5.3 09/12/2017 1121   RBC 5.50 (H) 05/28/2018 098185  RBC 4.90 09/12/2017 1121   HGB 13.5 05/28/2018 0918   HCT 41.5 05/28/2018 0918   PLT 308 09/12/2017 1121   MCV 76 (L) 05/28/2018 0918   MCH 24.5 (L) 05/28/2018 0918   MCH 25.1 (L) 09/12/2017 1121   MCHC 32.5 05/28/2018 0918   MCHC 32.6 09/12/2017 1121   RDW 18.2 (H) 05/28/2018 0918   LYMPHSABS 2.9 05/28/2018 0918   MONOABS 396 03/17/2017 1513   EOSABS 0.2 05/28/2018 0918   BASOSABS 0.0 05/28/2018 0918   Iron/TIBC/Ferritin/ %Sat    Component Value Date/Time   IRON 59 03/01/2017 2105   TIBC 291 03/01/2017 2105   FERRITIN 47 03/01/2017 2105   IRONPCTSAT 20 03/01/2017 2105   Lipid Panel     Component Value Date/Time   CHOL 185 05/28/2018 0918   TRIG 166 (H) 05/28/2018 0918   HDL 53 05/28/2018 0918   CHOLHDL 2.7 07/25/2017 1130   VLDL 27 02/26/2017 0341   LDLCALC 99 05/28/2018 0918   LDLCALC 70 07/25/2017 1130   Hepatic Function Panel     Component Value Date/Time   PROT 7.0 05/28/2018 0918   ALBUMIN 4.3 05/28/2018 0918   AST 16 05/28/2018 0918   ALT 23 05/28/2018 0918   ALKPHOS 130 (H) 05/28/2018 0918   BILITOT 0.2 05/28/2018 0918      Component Value Date/Time   TSH 1.060 05/28/2018 0918   TSH 0.76 11/30/2017 0954   TSH 0.99 05/24/2017 1629  Results for MARYALICE, PASLEY (MRN [id number]) as of 08/08/2018 11:32  Ref. Range 05/28/2018 09:18  Vitamin D, 25-Hydroxy Latest Ref Range: 30.0 - 100.0 ng/mL 6.9 (L)    ASSESSMENT AND PLAN: Vitamin D deficiency - Plan:  Vitamin D, Ergocalciferol, (DRISDOL) 50000 units CAPS capsule  Type 2 diabetes mellitus without complication, without long-term current use of insulin (HCC) - Plan: liraglutide (VICTOZA) 18 MG/3ML SOPN  Class 2 severe obesity with serious comorbidity and body mass index (BMI) of 39.0 to 39.9 in adult, unspecified obesity type ([place])  PLAN:  Vitamin D Deficiency Sharon Chan was informed that low vitamin D levels contributes to fatigue and are associated with obesity, breast, and colon cancer. Sharon Chan agrees to continue taking prescription Vit D _0 ,000 IU every week #4 and we will refill for 1 month.  She will follow up for routine testing of vitamin D, at least 2-3 times per year. She was informed of the risk of over-replacement of vitamin D and agrees to not increase her dose unless she discusses this with [name] first. Sharon Chan agrees to follow up with our clinic in 2 weeks.  Diabetes II Sharon Chan has been given extensive diabetes education by myself today including ideal fasting and post-prandial blood glucose readings, individual ideal HgA1c goals  and hypoglycemia prevention. We discussed the importance of good blood sugar control to decrease the likelihood of diabetic complications such as nephropathy, neuropathy, limb loss, blindness, coronary artery disease, and death. We discussed the importance of intensive lifestyle modification including diet, exercise and weight loss as the first line treatment for diabetes. Tynasia agrees to continue Victoza 0.6 mg SubQ q AM #1 pen and we will refill for 1 month. Sharon Chan agrees to follow up with our clinic in 2 weeks.  Obesity Sharon Chan is currently in the action stage of change. As such, her goal is to continue with weight loss efforts She has agreed to follow the Category 1 plan Sharon Chan has been instructed to work up to a goal of 150 minutes of combined cardio and strengthening exercise  per week for weight loss and overall health benefits. We discussed the  following Behavioral Modification Strategies today: work on meal planning and easy cooking plans and planning for success   Sharon Chan has agreed to follow up with our clinic in 2 weeks. She was informed of the importance of frequent follow up visits to maximize her success with intensive lifestyle modifications for her multiple health conditions.   OBESITY BEHAVIORAL INTERVENTION VISIT  Today's visit was # 6   Starting weight: 234 lbs Starting date: 05/28/18 Today's weight : 216 lbs  Today's date: 08/07/2018 Total lbs lost to date: 18 At least 15 minutes were spent on discussing the following behavioral intervention visit.   ASK: We discussed the diagnosis of obesity with Sharon Chan today and Sharon Chan agreed to give [name] permission to discuss obesity behavioral modification therapy today.  ASSESS: Antonela has the diagnosis of obesity and her BMI today is 39.5 Sharon Chan is in the action stage of change   ADVISE: Sharon Chan was educated on the multiple health risks of obesity as well as the benefit of weight loss to improve her health. She was advised of the need for long term treatment and the importance of lifestyle modifications.  AGREE: Multiple dietary modification options and treatment options were discussed and  Sharon Chan agreed to the above obesity treatment plan.  Wilhemena Durie, am acting as transcriptionist for Abby Potash, PA-C I, Abby Potash, PA-C have reviewed above note and agree with its content

## 2018-08-09 ENCOUNTER — Ambulatory Visit: Payer: Medicare Other | Admitting: Family Medicine

## 2018-08-10 ENCOUNTER — Ambulatory Visit (INDEPENDENT_AMBULATORY_CARE_PROVIDER_SITE_OTHER): Payer: Medicare Other | Admitting: Family Medicine

## 2018-08-10 ENCOUNTER — Encounter: Payer: Self-pay | Admitting: Family Medicine

## 2018-08-10 VITALS — BP 146/74 | HR 84 | Temp 98.0°F | Ht 62.0 in | Wt 220.0 lb

## 2018-08-10 DIAGNOSIS — G4700 Insomnia, unspecified: Secondary | ICD-10-CM | POA: Diagnosis not present

## 2018-08-10 DIAGNOSIS — R51 Headache: Secondary | ICD-10-CM | POA: Diagnosis not present

## 2018-08-10 DIAGNOSIS — R42 Dizziness and giddiness: Secondary | ICD-10-CM | POA: Diagnosis not present

## 2018-08-10 DIAGNOSIS — R8290 Unspecified abnormal findings in urine: Secondary | ICD-10-CM

## 2018-08-10 DIAGNOSIS — R519 Headache, unspecified: Secondary | ICD-10-CM

## 2018-08-10 DIAGNOSIS — Z09 Encounter for follow-up examination after completed treatment for conditions other than malignant neoplasm: Secondary | ICD-10-CM | POA: Diagnosis not present

## 2018-08-10 DIAGNOSIS — E781 Pure hyperglyceridemia: Secondary | ICD-10-CM

## 2018-08-10 DIAGNOSIS — I6930 Unspecified sequelae of cerebral infarction: Secondary | ICD-10-CM | POA: Diagnosis not present

## 2018-08-10 DIAGNOSIS — E119 Type 2 diabetes mellitus without complications: Secondary | ICD-10-CM | POA: Diagnosis not present

## 2018-08-10 DIAGNOSIS — I10 Essential (primary) hypertension: Secondary | ICD-10-CM | POA: Diagnosis not present

## 2018-08-10 DIAGNOSIS — I639 Cerebral infarction, unspecified: Secondary | ICD-10-CM

## 2018-08-10 LAB — POCT GLYCOSYLATED HEMOGLOBIN (HGB A1C): Hemoglobin A1C: 5.6 % (ref 4.0–5.6)

## 2018-08-10 LAB — POCT URINALYSIS DIP (MANUAL ENTRY)
Bilirubin, UA: NEGATIVE
Glucose, UA: NEGATIVE mg/dL
Ketones, POC UA: NEGATIVE mg/dL
Nitrite, UA: NEGATIVE
Spec Grav, UA: 1.015 (ref 1.010–1.025)
Urobilinogen, UA: 0.2 E.U./dL
pH, UA: 5.5 (ref 5.0–8.0)

## 2018-08-10 MED ORDER — ATORVASTATIN CALCIUM 40 MG PO TABS: 40.0000 mg | ORAL_TABLET | Freq: Every day | ORAL | 6 refills | Status: DC

## 2018-08-10 MED ORDER — CYCLOBENZAPRINE HCL 5 MG PO TABS: ORAL_TABLET | ORAL | 6 refills | Status: DC

## 2018-08-10 MED ORDER — ZOLPIDEM TARTRATE 5 MG PO TABS: ORAL_TABLET | ORAL | 3 refills | Status: DC

## 2018-08-10 NOTE — Progress Notes (Signed)
Follow Up  Subjective:    Patient ID: Sharon Chan, female    DOB: Jan 04, 1976, 42 y.o.   MRN: [id number]   Chief Complaint  Patient presents with  . Follow-up    chronic condition     HPI  Sharon Chan is a 42 year old female with a past medical history of Thyroid Nodule, Shoulder Pain, Seizures, Right Sided Weakness, Polycystic Disease, Neck Pain, Migraines, Joi                                                               nt Pain, Hypertension, Hyperlipidemia, Diabetes, Depression, and CVA. She is here today for follow up. ++++++++++++++++    Review of Systems     Objective:   Physical Exam        Assessment & Plan:

## 2018-08-10 NOTE — Progress Notes (Signed)
Follow Up  Subjective:    Patient ID: Sharon Chan, female    DOB: Jun 21, 1976, 42 y.o.   MRN: [id number]  HPI  Sharon Chan is a 42 year old female with a past medical history of Thyroid nodule, Shoulder Pain, Seizures, Right Sided Weakness, Polycystic Disease, Neck Pain, Migraines, Joint Pain, Hypertension. HLD, Diabetes, Depression, and CVA. She is here today for follow up.  Continues to have moderate, residual right sided weakness. S/p: 02/2017. She continues to follow up with Neurologist. She uses cane occasionally. She reports occasional dizziness and recent falls. She states that she is getting in a hurry and forgets to 'pick her right foot up.'  She has recently began walking done and attending healthy weight and wellness classes for the past 3 months. She has a follow up appointment with Neurologist on 08/14/2018 and 08/15/2018 for follow up and MRI.   Fasting blood sugars are 80-88. Postpranal is 120-140. She continues to take anti-diabetic meds as prescribed.   Anxiety has been increased lately, because of remembrance of her father's death by suicide and worries about her health conditions.  She has follow up with Dr. Lucianne Muss for Thyroid nodules.   Review of Systems  Eyes: Negative.   Respiratory: Negative.   Cardiovascular: Negative.   Gastrointestinal: Negative.   Genitourinary: Negative.   Musculoskeletal: Negative.   Neurological: Positive for dizziness and headaches.  Psychiatric/Behavioral: Positive for sleep disturbance (insomnia).    Objective:   Physical Exam  Constitutional: She is oriented to person, place, and time.  Cardiovascular: Normal rate, regular rhythm, normal heart sounds and intact distal pulses.  Pulmonary/Chest: Effort normal and breath sounds normal.  Abdominal: Soft. Bowel sounds are normal. She exhibits distension (Obese).  Musculoskeletal: Normal range of motion.  Neurological: She is alert and oriented to person, place, and time.   Psychiatric: She has a normal mood and affect. Her behavior is normal. Judgment and thought content normal.   Assessment & Plan:   1. Type 2 diabetes mellitus without complication, without long-term current use of insulin (HCC) Improved. Hgb A1c is within normal range of 5.6 today, from 7.4 on 05/28/2018. Continue Metformin as prescribed. She will continue to decrease foods/beverages high in sugars and carbs and follow Heart Healthy or DASH diet. Increase physical activity to at least 30 minutes cardio exercise daily.  - POCT glycosylated hemoglobin (Hb A1C) - POCT urinalysis dipstick  2. History of CVA with residual deficit Refill: - cyclobenzaprine (FLEXERIL) 5 MG tablet; TAKE 1 TABLET 3 TIMES A DAY AS NEEDED FOR MUSCLE SPASM SHOULDER/NECK PAIN  Dispense: 90 tablet; Refill: 6  3. Essential hypertension Antihypertensive medication is effective. Blood pressure is 146/74 today. Continue Losartan as prescribed.   4. Insomnia, unspecified type Refill: - zolpidem (AMBIEN) 5 MG tablet; TAKE 1 TABLET BY MOUTH EVERY DAY AT BEDTIME AS NEEDED FOR SLEEP  Dispense: 30 tablet; Refill: 3  5. Dizziness Continue use of cane and careful ambulation.   6. Frequent headaches Continue Topamax as needed.   7. Cerebrovascular accident (CVA), unspecified mechanism (HCC) S/p: 02/2017. Stable. She continues to follow up with Neurologist. Continue use of cane and careful ambulation because of right sided weakness.   8. Pure hyperglyceridemia Lipid panel on 05/28/2018 stable. Continue Atorvastatin as prescribed.  - atorvastatin (LIPITOR) 40 MG tablet; Take 1 tablet (40 mg total) by mouth daily at 6 PM.  Dispense: 30 tablet; Refill: 6  9. Abnormal urinalysis - Urine Culture  10. Follow up She will follow up in  6 months.   Meds ordered this encounter  Medications  . zolpidem (AMBIEN) 5 MG tablet    Sig: TAKE 1 TABLET BY MOUTH EVERY DAY AT BEDTIME AS NEEDED FOR SLEEP    Dispense:  30 tablet    Refill:   3    Not to exceed 5 additional fills before 10/07/2018  . cyclobenzaprine (FLEXERIL) 5 MG tablet    Sig: TAKE 1 TABLET 3 TIMES A DAY AS NEEDED FOR MUSCLE SPASM SHOULDER/NECK PAIN    Dispense:  90 tablet    Refill:  6  . atorvastatin (LIPITOR) 40 MG tablet    Sig: Take 1 tablet (40 mg total) by mouth daily at 6 PM.    Dispense:  30 tablet    Refill:  6    Raliegh Ip,  MSN, [facility] Patient [facility] [address] [place], [place] [address] [phone]

## 2018-08-12 LAB — URINE CULTURE

## 2018-08-14 ENCOUNTER — Encounter: Payer: Self-pay | Admitting: Psychology

## 2018-08-14 ENCOUNTER — Ambulatory Visit (INDEPENDENT_AMBULATORY_CARE_PROVIDER_SITE_OTHER): Payer: Medicare Other | Admitting: Psychology

## 2018-08-14 DIAGNOSIS — R413 Other amnesia: Secondary | ICD-10-CM

## 2018-08-14 DIAGNOSIS — I6381 Other cerebral infarction due to occlusion or stenosis of small artery: Secondary | ICD-10-CM

## 2018-08-14 DIAGNOSIS — F329 Major depressive disorder, single episode, unspecified: Secondary | ICD-10-CM

## 2018-08-14 DIAGNOSIS — F419 Anxiety disorder, unspecified: Secondary | ICD-10-CM

## 2018-08-14 DIAGNOSIS — F32A Depression, unspecified: Secondary | ICD-10-CM

## 2018-08-14 DIAGNOSIS — I679 Cerebrovascular disease, unspecified: Secondary | ICD-10-CM

## 2018-08-14 NOTE — Telephone Encounter (Signed)
Patient notified

## 2018-08-14 NOTE — Progress Notes (Signed)
NEUROBEHAVIORAL STATUS EXAM   Name: Sharon Chan Date of Birth: [birth year] Date of Interview: 08/14/2018  Reason for Referral:  Sharon Chan is a 42 y.o. left-handed female who is referred for neuropsychological evaluation by Dr. Ellouise Newer of [facility] due to concerns about memory concerns status-post CVA in 02/2017. This patient is unaccompanied in the office for today's visit.  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 Brain MRI on 02/25/2017 reportedly revealed the following: acute nonhemorrhagic infarcts involving the left thalamus measures 9 mm maximally; remote infarcts involving the basal ganglia bilaterally, scattered white matter changes bilaterally are mildly advanced for age, this likely also reflects the sequela of microvascular ischemia or vasculitis; remote infarcts involving the body of the corpus callosum. MRA head showed extensive medium and small vessel disease. Following the stroke, the patient was seen by Dr. Leonie Man in May 2018 and then Dr. Delice Lesch in December 2018. She underwent outpatient PT, OT and ST through neurorehab. She reported, and continues to report, daily headaches, right sided numbness and weakness, dizziness and falls. She was utilizing a cane to walk but has been able to go without that for the past month. She reports falls have been less frequent. Repeat MRI was ordered by Dr. Delice Lesch in December given that the degree of weakness on her right side was felt to be unexplained by a thalamic infarct, but this has not been performed. The patient has also been experiencing depression and anxiety since the stroke. Dr. Delice Lesch prescribed Cymbalta for pain since that could help with depression as well. The patient reports she continues to take Cymbalta but I do not see it on her medication list. She also reported that her PCP recently prescribed bupropion, and she is "alternating"  the Cymbalta and bupropion (every other day). She noted the only effect she has observed of the Cymbalta is increased appetite, which she does not like as she is trying to lose weight. She is also on Topamax, she tells me this is for her history of post-stroke seizures. The patient is diagnosed with Type II diabetes and on Metformin but Hgb A1c was within normal range (5.6) on 08/10/2018, down from 7.4 on 05/28/2018.  The patient reported that prior to her stroke she had no concerns about her cognitive functioning. She was not working at the time (was on disability for another condition) but she was living alone and managing all instrumental ADLs with no difficulty. She reported significant cognitive difficulty after the stroke upon her return home. She reported trouble "even with basic thinks like how to take a shower and cooking". This has improved. However, she continues to have significant forgetfulness. She feels this for both recent and long-term episodic memory. She mentioned that she can hardly remember much of her childhood now, and this wasn't an issue prior to the stroke. She also notes facial recognition difficulty. She frequently forgets if she has turned off the stove or the lights so she "has to check a million times".   Upon direct questioning, she reports the following with regard to current cognitive functioning (compared to pre-stroke functioning): Misplacing/losing items: Sometimes Forgetting appointments or other obligations: Sometimes Forgetting to take medications: Sometimes - she does have alarms set. At one point she was double-taking medications because she couldn't remember if she had taken them or not. Now she has a system now that is working. Difficulty concentrating: Yes Starting but not finishing tasks:  Yes Distracted easily: Yes Processing information more slowly: Yes (she also notes she has hearing difficulty on the right side since the stroke) Word-finding difficulty: Yes.  She also notes that she was "very fluent" in [place] before the stroke and completely lost her [place] fluency afterwards.  Word substitutions: Yes Writing difficulty: No Spelling difficulty: Some Comprehension difficulty: No Getting lost when driving: Yes (and losing her car in parking lot - Sunday night she "broke down crying" at [facility] when she couldn't find her car; after a while she remembered where it was) Making wrong turns when driving: No Uncertain about directions when driving or passenger: Yes  The patient lives alone and manages all instrumental ADLs. She has returned to driving short distances. She does not do any night driving. She manages her medications, cooking, and appointments. She manages her finances. She notes she has forgotten to pay some bills, and had to put everything on automatic withdrawal so she would not miss payments. She notes that numbers are sometimes difficulty for her since the stroke.  Sharon Chan denied any psychiatric history prior to the stroke. She denied former mental health diagnoses or treatment. She denied history of substance abuse or dependence. She reported significant mood changes since the stroke. She is much more tearful. She often wakes up crying for no specific reason. She feels hopeless occasionally. She has not been spending time with her friends which she used to do a couple of times a week before the stroke. At first she was embarrassed by her physical and cognitive changes, but now she just does "not want to talk about the stroke all the time and they do". She is now walking every day and that has been helping her mood. She also goes to her pastor for counseling which she states is very supportive. She is not currently seeing a therapist, I see she was working with Dr. Mallie Mussel, psychologist in [facility], as recently as July 2019. She notes that she is also close with her sister and talks to her regularly. The patient denies suicidal  ideation or intention. She states she "could never do that" to her family because her father actually committed suicide in 2003 and she sees how that has affected her family. She noted that this past weekend was the anniversary of her father's suicide.   Sharon Chan also reported significantly increased anxiety since the stroke. She does not want to leave her home and being in crowds overwhelms her. She has started doing her grocery shopping in the middle of the night to avoid crowds. She also has significant anxiety about the possibility of having another stroke. She will feel pressure in her head or have numbness in her arm or mouth and get scared that she is having another stroke. She tries not to go into a panic attack and talks herself down.  She noted that for the last 8 years she would have a sensation that "blood is dripping in my head, and I could almost hear it". She states this sensation was "exactly where the stroke happened". She still experiences that sensation.   Since the stroke the patient reports significant insomnia. She reports she is only achieving 1-2 hours of sleep per night. She does not get tired, even with Ambien. She can never fall asleep before 4 am. Then she will get a chunk about about 2 hours of sleep. She sometimes feels very groggy upon awakening and wishes she could get another hour of sleep but can't.  Once she is up she does not feel tired. She does not nap during the day. She wishes she could.  She notes that due to having a headache all day every day since the stroke, she has been keeping her house dark. This could be contributing to circadian rhythm being off. She has had vitamin D deficiency possibly related to this as well. However recently she has been forcing herself to go walk in the park each morning.   The patient denies any visual or auditory hallucinations.   Social History: Born/Raised: [place] No learning difficulties that knows of but was in speech  therapy in first to fifth grade (thick tongue) Education: College degree - great grades In school now (PhD in divinity - want to be a youth pastor), only taking one class, that's all I can handle, it's taken a lot but am getting an A.  Online course. Have to watch lectures 2-3 times. 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 CVA (cerebral vascular accident) ([place])   . Depression   . DM type 2 (diabetes mellitus, type 2) ([place])   . HLD (hyperlipidemia)   . HTN (hypertension)   . Joint pain   . Migraines   . Neck pain   . Polycystic disease, ovaries   . Right sided weakness   . Seizures ([place])   . Shoulder pain   . Sickle cell trait ([place])   . Thyroid nodule      Current Medications:  Outpatient Encounter Medications as of 08/14/2018  Medication Sig  . acetaminophen (TYLENOL) 325 MG tablet Take 1-2 tablets (325-650 mg total) by mouth every 4 (four) hours as needed for mild pain.  Marland Kitchen aspirin EC 325 MG tablet Take 1 tablet (325 mg total) daily by mouth.  Marland Kitchen atorvastatin (LIPITOR) 40 MG tablet Take 1 tablet (40 mg total) by mouth daily at 6 PM.  . blood glucose meter kit and supplies KIT Dispense based on patient and insurance preference. Use up to four times daily as directed. (FOR ICD-9 250.00, 250.01).  Marland Kitchen buPROPion (WELLBUTRIN XL) 150 MG 24 hr tablet TAKE 1 TABLET BY MOUTH EVERY DAY  . cyclobenzaprine (FLEXERIL) 5 MG tablet TAKE 1 TABLET 3 TIMES A DAY AS NEEDED FOR MUSCLE SPASM SHOULDER/NECK PAIN  . diphenhydrAMINE (BENADRYL ALLERGY) 25 MG tablet Take 25 mg by mouth every 6 (six) hours as needed.  . gabapentin (NEURONTIN) 300 MG capsule TAKE 1 TABLET IN THE MORNING AND 2 TABLETS AT BEDTIME DAILY  . glucose blood (ONE TOUCH ULTRA  TEST) test strip TEST TWICE DAILY  . Insulin Pen Needle (BD PEN NEEDLE NANO 2ND GEN) 32G X 4 MM MISC 1 Package by Does not apply route 2 (two) times daily.  Marland Kitchen liraglutide (VICTOZA) 18 MG/3ML SOPN Inject 0.1 mLs (0.6 mg total) into the skin every morning.  Marland Kitchen losartan (COZAAR) 25 MG tablet TAKE 1 TABLET BY MOUTH EVERY DAY  . metFORMIN (GLUCOPHAGE) 500 MG tablet TAKE 1 TABLET (500 MG TOTAL) BY MOUTH 2 (TWO) TIMES DAILY WITH A MEAL.  Marland Kitchen ONETOUCH DELICA LANCETS 78H MISC 1 Package by Does not apply route 2 (two) times daily.  Marland Kitchen topiramate (TOPAMAX) 100 MG tablet Take 1 tablet (100 mg total) by mouth 2 (two) times daily. Take 143m in  am and 284m at night  . Vitamin D, Ergocalciferol, (DRISDOL) 50000 units CAPS capsule Take 1 capsule (50,000 Units total) by mouth every 7 (seven) days.  .Marland Kitchenzolpidem (AMBIEN) 5 MG tablet TAKE 1 TABLET BY MOUTH EVERY DAY AT BEDTIME AS NEEDED FOR SLEEP   No facility-administered encounter medications on file as of 08/14/2018.    Patient states she is still taking Cymbalta and is alternating it every other day with bupropion.   Behavioral Observations:   Appearance: Neatly and appropriately dressed and groomed Gait: Ambulated independently, no gross abnormalities observed Speech: Fluent; normal rate, rhythm and volume. No significant word finding difficulty was observed during conversational speech and interview. Thought process: Linear, goal directed Affect: Full, anxious Interpersonal: Very pleasant, appropriate   40 minutes spent face-to-face with patient completing neurobehavioral status exam. 55 minutes spent integrating medical records/clinical data and completing this report. CPT [id number], [id number].   TESTING: There is medical necessity to proceed with neuropsychological assessment as the results will be used to aid in differential diagnosis and clinical decision-making and to inform specific treatment recommendations. Per the patient and medical  records reviewed, there has been a change in cognitive functioning and a reasonable suspicion of neurocognitive deficits status-post stroke (there is also high likelihood that depression/anxiety and insomnia are playing a role).  Clinical Decision Making: In considering the patient's current level of functioning, level of presumed impairment, nature of symptoms, emotional and behavioral responses during the interview, level of literacy, and observed level of motivation, a battery of tests was selected and communicated to the psychometrician.    PLAN: The patient will return tomorrow to complete the above referenced full battery of neuropsychological testing with a psychometrician under my supervision. Education regarding testing procedures was provided to the patient. Subsequently, in early November, the patient will see this provider for a follow-up session at which time her test performances and my impressions and treatment recommendations will be reviewed in detail.  Evaluation ongoing; full report to follow.

## 2018-08-14 NOTE — Telephone Encounter (Signed)
-----   Message from Kallie Locks, FNP sent at 08/14/2018 12:05 AM EDT ----- Regarding: "Urine Culture Results" Urine culture is negative. Please inform patient.    Thank you.

## 2018-08-15 ENCOUNTER — Ambulatory Visit: Payer: Medicare Other | Admitting: Psychology

## 2018-08-15 ENCOUNTER — Encounter: Payer: Self-pay | Admitting: Psychology

## 2018-08-15 DIAGNOSIS — R413 Other amnesia: Secondary | ICD-10-CM

## 2018-08-15 NOTE — Progress Notes (Signed)
   Neuropsychology Note  Sharon Chan completed 165 minutes of neuropsychological testing with technician, Sharon Chan, BS, under the supervision of Dr. Elvis Coil, Licensed Psychologist. The patient did not appear overtly distressed by the testing session, per behavioral observation or via self-report to the technician. Rest breaks were offered.   Clinical Decision Making: In considering the patient's current level of functioning, level of presumed impairment, nature of symptoms, emotional and behavioral responses during the interview, level of literacy, and observed level of motivation/effort, a battery of tests was selected and communicated to the psychometrician.  Communication between the psychologist and technician was ongoing throughout the testing session and changes were made as deemed necessary based on patient performance on testing, technician observations and additional pertinent factors such as those listed above.  Sharon Chan will return within approximately 2 weeks for an interactive feedback session with Dr. Alinda Dooms at which time her test performances, clinical impressions and treatment recommendations will be reviewed in detail. The patient understands she can contact our office should she require our assistance before this time.  35 minutes spent performing neuropsychological evaluation services/clinical decision making (psychologist). [CPT 96132] 165 minutes spent face-to-face with patient administering standardized tests, 60 minutes spent scoring (technician). [CPT [id number], 96139]  Full report to follow.

## 2018-08-21 ENCOUNTER — Encounter (INDEPENDENT_AMBULATORY_CARE_PROVIDER_SITE_OTHER): Payer: Self-pay | Admitting: Physician Assistant

## 2018-08-21 ENCOUNTER — Ambulatory Visit (INDEPENDENT_AMBULATORY_CARE_PROVIDER_SITE_OTHER): Payer: Medicare Other | Admitting: Physician Assistant

## 2018-08-21 VITALS — BP 156/101 | HR 79 | Temp 98.2°F | Ht 62.0 in | Wt 216.0 lb

## 2018-08-21 DIAGNOSIS — E559 Vitamin D deficiency, unspecified: Secondary | ICD-10-CM

## 2018-08-21 DIAGNOSIS — Z6839 Body mass index (BMI) 39.0-39.9, adult: Secondary | ICD-10-CM | POA: Diagnosis not present

## 2018-08-21 DIAGNOSIS — E119 Type 2 diabetes mellitus without complications: Secondary | ICD-10-CM

## 2018-08-21 MED ORDER — VITAMIN D (ERGOCALCIFEROL) 1.25 MG (50000 UNIT) PO CAPS: 50000.0000 [IU] | ORAL_CAPSULE | ORAL | 0 refills | Status: DC

## 2018-08-22 NOTE — Progress Notes (Signed)
Office: [phone]  /  Fax: [phone]   HPI:   Chief Complaint: OBESITY Sharon Chan is here to discuss her progress with her obesity treatment plan. She is on the Category 1 plan and is following her eating plan approximately 98 % of the time. She states she is walking for 60 minutes 6 times per week. [name] did well with weight maintenance. She reports eating out at [facility] twice this past week. She has been using [employer].  Her weight is 216 lb (98 kg) today and has not lost weight since her last visit. She has lost 18 lbs since starting treatment with [name].  Vitamin D Deficiency Sharon Chan has a diagnosis of vitamin D deficiency. She is currently taking prescription Vit D and denies nausea, vomiting or muscle weakness.  Diabetes II Sharon Chan has a diagnosis of diabetes type II. Ellizabeth states BGs after eating range between 100 and 128 and fasting BGs range between 67 and 100. She had a BG of 67 after not eating the previous night. Last A1c was 5.6. She has been working on intensive lifestyle modifications including diet, exercise, and weight loss to help control her blood glucose levels.  ALLERGIES: Allergies  Allergen Reactions  . Penicillins Anaphylaxis    MEDICATIONS: Current Outpatient Medications on File Prior to Visit  Medication Sig Dispense Refill  . acetaminophen (TYLENOL) 325 MG tablet Take 1-2 tablets (325-650 mg total) by mouth every 4 (four) hours as needed for mild pain.    Marland Kitchen aspirin EC 325 MG tablet Take 1 tablet (325 mg total) daily by mouth. 360 tablet 1  . atorvastatin (LIPITOR) 40 MG tablet Take 1 tablet (40 mg total) by mouth daily at 6 PM. 30 tablet 6  . blood glucose meter kit and supplies KIT Dispense based on patient and insurance preference. Use up to four times daily as directed. (FOR ICD-9 250.00, 250.01). 1 each 0  . buPROPion (WELLBUTRIN XL) 150 MG 24 hr tablet TAKE 1 TABLET BY MOUTH EVERY DAY 30 tablet 0  . cyclobenzaprine (FLEXERIL) 5 MG tablet  TAKE 1 TABLET 3 TIMES A DAY AS NEEDED FOR MUSCLE SPASM SHOULDER/NECK PAIN 90 tablet 6  . diphenhydrAMINE (BENADRYL ALLERGY) 25 MG tablet Take 25 mg by mouth every 6 (six) hours as needed.    . gabapentin (NEURONTIN) 300 MG capsule TAKE 1 TABLET IN THE MORNING AND 2 TABLETS AT BEDTIME DAILY 90 capsule 3  . glucose blood (ONE TOUCH ULTRA TEST) test strip TEST TWICE DAILY 100 each 0  . Insulin Pen Needle (BD PEN NEEDLE NANO 2ND GEN) 32G X 4 MM MISC 1 Package by Does not apply route 2 (two) times daily. 100 each 0  . liraglutide (VICTOZA) 18 MG/3ML SOPN Inject 0.1 mLs (0.6 mg total) into the skin every morning. 1 pen 0  . losartan (COZAAR) 25 MG tablet TAKE 1 TABLET BY MOUTH EVERY DAY 30 tablet 0  . metFORMIN (GLUCOPHAGE) 500 MG tablet TAKE 1 TABLET (500 MG TOTAL) BY MOUTH 2 (TWO) TIMES DAILY WITH A MEAL. 180 tablet 3  . ONETOUCH DELICA LANCETS 94T MISC 1 Package by Does not apply route 2 (two) times daily. 100 each 0  . zolpidem (AMBIEN) 5 MG tablet TAKE 1 TABLET BY MOUTH EVERY DAY AT BEDTIME AS NEEDED FOR SLEEP 30 tablet 3  . topiramate (TOPAMAX) 100 MG tablet Take 1 tablet (100 mg total) by mouth 2 (two) times daily. Take 127m in am and 2058mat night 90 tablet 3   No current  facility-administered medications on file prior to visit.     PAST MEDICAL HISTORY: Past Medical History:  Diagnosis Date  . CVA (cerebral vascular accident) ([place])   . Depression   . DM type 2 (diabetes mellitus, type 2) ([place])   . HLD (hyperlipidemia)   . HTN (hypertension)   . Joint pain   . Migraines   . Neck pain   . Polycystic disease, ovaries   . Right sided weakness   . Seizures ([place])   . Shoulder pain   . Sickle cell trait ([place])   . Thyroid nodule     PAST SURGICAL HISTORY: Past Surgical History:  Procedure Laterality Date  . LAPAROSCOPIC CHOLECYSTECTOMY      SOCIAL HISTORY: Social History   Tobacco Use  . Smoking status: Never Smoker  . Smokeless tobacco: Never Used  Substance Use Topics  .  Alcohol use: No  . Drug use: No    FAMILY HISTORY: Family History  Problem Relation Age of Onset  . Heart attack Mother   . Diabetes Mother   . Hypertension Mother   . Hyperlipidemia Mother   . Post-traumatic stress disorder Father        committed suicide  . Diabetes Father   . Hypertension Sister   . Thyroid cancer Paternal Grandmother     ROS: Review of Systems  Constitutional: Negative for weight loss.  Gastrointestinal: Negative for nausea and vomiting.  Musculoskeletal:       Negative muscle weakness    PHYSICAL EXAM: Blood pressure (!) 156/101, pulse 79, temperature 98.2 F (36.8 C), temperature source Oral, height 5' 2"  (1.575 m), weight 216 lb (98 kg), SpO2 99 %. Body mass index is 39.51 kg/m. Physical Exam  Constitutional: She is oriented to person, place, and time. She appears well-developed and well-nourished.  Cardiovascular: Normal rate.  Pulmonary/Chest: Effort normal.  Musculoskeletal: Normal range of motion.  Neurological: She is oriented to person, place, and time.  Skin: Skin is warm and dry.  Psychiatric: She has a normal mood and affect. Her behavior is normal.  Vitals reviewed.   RECENT LABS AND TESTS: BMET    Component Value Date/Time   NA 140 05/28/2018 0918   K 4.0 05/28/2018 0918   CL 97 05/28/2018 0918   CO2 29 05/28/2018 0918   GLUCOSE 130 (H) 05/28/2018 0918   GLUCOSE 101 (H) 09/12/2017 1121   BUN 10 05/28/2018 0918   CREATININE 0.84 05/28/2018 0918   CREATININE 0.92 09/12/2017 1121   CALCIUM 9.6 05/28/2018 0918   GFRNONAA 85 05/28/2018 0918   GFRNONAA 71 03/17/2017 1513   GFRAA 98 05/28/2018 0918   GFRAA 82 03/17/2017 1513   Lab Results  Component Value Date   HGBA1C 5.6 08/10/2018   HGBA1C 7.4 (H) 05/28/2018   HGBA1C 6.2 02/06/2018   HGBA1C 5.8 07/25/2017   HGBA1C 6.3 (H) 02/26/2017   Lab Results  Component Value Date   INSULIN 27.1 (H) 05/28/2018   CBC    Component Value Date/Time   WBC 9.3 05/28/2018 0918    WBC 5.3 09/12/2017 1121   RBC 5.50 (H) 05/28/2018 0918   RBC 4.90 09/12/2017 1121   HGB 13.5 05/28/2018 0918   HCT 41.5 05/28/2018 0918   PLT 308 09/12/2017 1121   MCV 76 (L) 05/28/2018 0918   MCH 24.5 (L) 05/28/2018 0918   MCH 25.1 (L) 09/12/2017 1121   MCHC 32.5 05/28/2018 0918   MCHC 32.6 09/12/2017 1121   RDW 18.2 (H) 05/28/2018 2956  LYMPHSABS 2.9 05/28/2018 0918   MONOABS 396 03/17/2017 1513   EOSABS 0.2 05/28/2018 0918   BASOSABS 0.0 05/28/2018 0918   Iron/TIBC/Ferritin/ %Sat    Component Value Date/Time   IRON 59 03/01/2017 2105   TIBC 291 03/01/2017 2105   FERRITIN 47 03/01/2017 2105   IRONPCTSAT 20 03/01/2017 2105   Lipid Panel     Component Value Date/Time   CHOL 185 05/28/2018 0918   TRIG 166 (H) 05/28/2018 0918   HDL 53 05/28/2018 0918   CHOLHDL 2.7 07/25/2017 1130   VLDL 27 02/26/2017 0341   LDLCALC 99 05/28/2018 0918   LDLCALC 70 07/25/2017 1130   Hepatic Function Panel     Component Value Date/Time   PROT 7.0 05/28/2018 0918   ALBUMIN 4.3 05/28/2018 0918   AST 16 05/28/2018 0918   ALT 23 05/28/2018 0918   ALKPHOS 130 (H) 05/28/2018 0918   BILITOT 0.2 05/28/2018 0918      Component Value Date/Time   TSH 1.060 05/28/2018 0918   TSH 0.76 11/30/2017 0954   TSH 0.99 05/24/2017 1629  Results for THELMA, LORENZETTI (MRN [id number]) as of 08/22/2018 17:51  Ref. Range 05/28/2018 09:18  Vitamin D, 25-Hydroxy Latest Ref Range: 30.0 - 100.0 ng/mL 6.9 (L)    ASSESSMENT AND PLAN: Vitamin D deficiency - Plan: Vitamin D, Ergocalciferol, (DRISDOL) 50000 units CAPS capsule  Type 2 diabetes mellitus without complication, without long-term current use of insulin (HCC)  Class 2 severe obesity with serious comorbidity and body mass index (BMI) of 39.0 to 39.9 in adult, unspecified obesity type ([place])  PLAN:  Vitamin D Deficiency Jace was informed that low vitamin D levels contributes to fatigue and are associated with obesity, breast, and colon cancer.  Debe agrees to continue taking prescription Vit D @50 ,000 IU every week #4 and we will refill for 1 month. She will follow up for routine testing of vitamin D, at least 2-3 times per year. She was informed of the risk of over-replacement of vitamin D and agrees to not increase her dose unless she discusses this with [name] first. We will check labs at next visit. Aliceson agrees to follow up with our clinic in 2 weeks.  Diabetes II Makyra has been given extensive diabetes education by myself today including ideal fasting and post-prandial blood glucose readings, individual ideal Hgb A1c goals and hypoglycemia prevention. We discussed the importance of good blood sugar control to decrease the likelihood of diabetic complications such as nephropathy, neuropathy, limb loss, blindness, coronary artery disease, and death. We discussed the importance of intensive lifestyle modification including diet, exercise and weight loss as the first line treatment for diabetes. Margerie agrees to continue Victoza and metformin and we will check labs at next visit. Khadeeja agrees to follow up with our clinic in 2 weeks.  Obesity Rei is currently in the action stage of change. As such, her goal is to continue with weight loss efforts She has agreed to follow the Category 1 plan Lavina has been instructed to work up to a goal of 150 minutes of combined cardio and strengthening exercise per week for weight loss and overall health benefits. We discussed the following Behavioral Modification Strategies today: work on meal planning and easy cooking plans and planning for success    Tykeshia has agreed to follow up with our clinic in 2 weeks. She was informed of the importance of frequent follow up visits to maximize her success with intensive lifestyle modifications for her multiple health conditions.  OBESITY BEHAVIORAL INTERVENTION VISIT  Today's visit was # 8   Starting weight: 234 lbs Starting date:  05/28/18 Today's weight : 216 lbs  Today's date: 08/21/2018 Total lbs lost to date: 18 At least 15 minutes were spent on discussing the following behavioral intervention visit.   ASK: We discussed the diagnosis of obesity with [name] today and Keerthi agreed to give [name] permission to discuss obesity behavioral modification therapy today.  ASSESS: Nicolasa has the diagnosis of obesity and her BMI today is 39.5 Shandy is in the action stage of change   ADVISE: Kem was educated on the multiple health risks of obesity as well as the benefit of weight loss to improve her health. She was advised of the need for long term treatment and the importance of lifestyle modifications.  AGREE: Multiple dietary modification options and treatment options were discussed and  Suetta agreed to the above obesity treatment plan.  Wilhemena Durie, am acting as transcriptionist for Abby Potash, PA-C I, Abby Potash, PA-C have reviewed above note and agree with its content

## 2018-09-03 ENCOUNTER — Other Ambulatory Visit: Payer: Self-pay | Admitting: Neurology

## 2018-09-03 DIAGNOSIS — R51 Headache: Principal | ICD-10-CM

## 2018-09-03 DIAGNOSIS — R519 Headache, unspecified: Secondary | ICD-10-CM

## 2018-09-04 ENCOUNTER — Other Ambulatory Visit: Payer: Self-pay

## 2018-09-04 ENCOUNTER — Other Ambulatory Visit (INDEPENDENT_AMBULATORY_CARE_PROVIDER_SITE_OTHER): Payer: Self-pay | Admitting: Family Medicine

## 2018-09-04 DIAGNOSIS — E119 Type 2 diabetes mellitus without complications: Secondary | ICD-10-CM

## 2018-09-07 ENCOUNTER — Other Ambulatory Visit (INDEPENDENT_AMBULATORY_CARE_PROVIDER_SITE_OTHER): Payer: Self-pay | Admitting: Family Medicine

## 2018-09-07 DIAGNOSIS — E119 Type 2 diabetes mellitus without complications: Secondary | ICD-10-CM

## 2018-09-11 ENCOUNTER — Ambulatory Visit (INDEPENDENT_AMBULATORY_CARE_PROVIDER_SITE_OTHER): Payer: Medicare Other | Admitting: Physician Assistant

## 2018-09-11 ENCOUNTER — Encounter (INDEPENDENT_AMBULATORY_CARE_PROVIDER_SITE_OTHER): Payer: Self-pay | Admitting: Physician Assistant

## 2018-09-11 VITALS — BP 140/94 | HR 97 | Temp 98.4°F | Ht 62.0 in | Wt 213.0 lb

## 2018-09-11 DIAGNOSIS — Z6839 Body mass index (BMI) 39.0-39.9, adult: Secondary | ICD-10-CM | POA: Diagnosis not present

## 2018-09-11 DIAGNOSIS — E119 Type 2 diabetes mellitus without complications: Secondary | ICD-10-CM | POA: Diagnosis not present

## 2018-09-11 DIAGNOSIS — E559 Vitamin D deficiency, unspecified: Secondary | ICD-10-CM | POA: Diagnosis not present

## 2018-09-11 NOTE — Progress Notes (Signed)
Office: [phone]  /  Fax: [phone]   HPI:   Chief Complaint: OBESITY Sharon Chan is here to discuss her progress with her obesity treatment plan. She is on the  follow the Category 1 plan and is following her eating plan approximately 70 % of the time. She states she is exercising 0 minutes 0 times per week. Cobi did very well with weight loss. She reports sweet cravings during PMS. She is traveling to [place] for a mission trip. She is asking about travel strategies.   Her weight is 213 lb (96.6 kg) today and has had a weight loss of 3 pounds over a period of 2 weeks since her last visit. She has lost 18 lbs since starting treatment with [name].  Diabetes II Sharon Chan has a diagnosis of diabetes type II. Sharon Chan states BGs range between 80 and 110 and denies any hypoglycemic episodes. Last A1c was Hemoglobin A1C Latest Ref Rng & Units 08/10/2018 05/28/2018 02/06/2018  HGBA1C 4.0 - 5.6 % 5.6 7.4(H) 6.2  Some recent data might be hidden    She has been working on intensive lifestyle modifications including diet, exercise, and weight loss to help control her blood glucose levels.  Vitamin D deficiency Sharon Chan has a diagnosis of vitamin D deficiency. She is currently taking vit D and denies nausea, vomiting or muscle weakness.  Hyperlipidemia Sharon Chan has hyperlipidemia and has been trying to improve her cholesterol levels with intensive lifestyle modification including a low saturated fat diet, exercise and weight loss. She denies any chest pain, claudication or myalgias.   ALLERGIES: Allergies  Allergen Reactions  . Penicillins Anaphylaxis    MEDICATIONS: Current Outpatient Medications on File Prior to Visit  Medication Sig Dispense Refill  . acetaminophen (TYLENOL) 325 MG tablet Take 1-2 tablets (325-650 mg total) by mouth every 4 (four) hours as needed for mild pain.    Marland Kitchen aspirin EC 325 MG tablet Take 1 tablet (325 mg total) daily by mouth. 360 tablet 1  . atorvastatin (LIPITOR)  40 MG tablet Take 1 tablet (40 mg total) by mouth daily at 6 PM. 30 tablet 6  . BD PEN NEEDLE NANO U/F 32G X 4 MM MISC 1 PACKAGE BY DOES NOT APPLY ROUTE 2 (TWO) TIMES DAILY. 100 each 0  . blood glucose meter kit and supplies KIT Dispense based on patient and insurance preference. Use up to four times daily as directed. (FOR ICD-9 250.00, 250.01). 1 each 0  . buPROPion (WELLBUTRIN XL) 150 MG 24 hr tablet TAKE 1 TABLET BY MOUTH EVERY DAY 30 tablet 0  . cyclobenzaprine (FLEXERIL) 5 MG tablet TAKE 1 TABLET 3 TIMES A DAY AS NEEDED FOR MUSCLE SPASM SHOULDER/NECK PAIN 90 tablet 6  . diphenhydrAMINE (BENADRYL ALLERGY) 25 MG tablet Take 25 mg by mouth every 6 (six) hours as needed.    . DULoxetine (CYMBALTA) 30 MG capsule TAKE 1 CAPSULE BY MOUTH EVERYDAY AT BEDTIME 30 capsule 4  . gabapentin (NEURONTIN) 300 MG capsule TAKE 1 TABLET IN THE MORNING AND 2 TABLETS AT BEDTIME DAILY 90 capsule 3  . glucose blood (ONE TOUCH ULTRA TEST) test strip TEST TWICE DAILY 100 each 0  . liraglutide (VICTOZA) 18 MG/3ML SOPN Inject 0.1 mLs (0.6 mg total) into the skin every morning. 1 pen 0  . losartan (COZAAR) 25 MG tablet TAKE 1 TABLET BY MOUTH EVERY DAY 30 tablet 0  . metFORMIN (GLUCOPHAGE) 500 MG tablet TAKE 1 TABLET (500 MG TOTAL) BY MOUTH 2 (TWO) TIMES DAILY WITH A MEAL.  180 tablet 3  . ONETOUCH DELICA LANCETS 93T MISC 1 Package by Does not apply route 2 (two) times daily. 100 each 0  . Vitamin D, Ergocalciferol, (DRISDOL) 50000 units CAPS capsule Take 1 capsule (50,000 Units total) by mouth every 7 (seven) days. 4 capsule 0  . zolpidem (AMBIEN) 5 MG tablet TAKE 1 TABLET BY MOUTH EVERY DAY AT BEDTIME AS NEEDED FOR SLEEP 30 tablet 3  . topiramate (TOPAMAX) 100 MG tablet Take 1 tablet (100 mg total) by mouth 2 (two) times daily. Take 147m in am and 2080mat night 90 tablet 3   No current facility-administered medications on file prior to visit.     PAST MEDICAL HISTORY: Past Medical History:  Diagnosis Date  . CV[place]  (cere[place] vascular [place]) ([place]  . Depression   . DM type 2 (diabetes mellitus, type 2) ([place]  . HLD (hyperlipidemia)   . HTN (hypertension)   . Joint pain   . Migraines   . Neck pain   . Polycystic disease, ovaries   . Right sided weakness   . Seizures ([facility]  . Shoulder pain   . Sickle cell trait ([place]  . Thyroid nodule     PAST SURGICAL HISTORY: Past Surgical History:  Procedure Laterality Date  . LAPAROSCOPIC CHOLECYSTECTOMY      SOCIAL HISTORY: Social History   Tobacco Use  . Smoking status: Never Smoker  . Smokeless tobacco: Never Used  Substance Use Topics  . Alcohol use: No  . Drug use: No    FAMILY HISTORY: Family History  Problem Relation Age of Onset  . Heart attack Mother   . Diabetes Mother   . Hypertension Mother   . Hyperlipidemia Mother   . Post-traumatic stress disorder Father        committed suicide  . Diabetes Father   . Hypertension Sister   . Thyroid cancer Paternal Grandmother     ROS: Review of Systems  All other systems reviewed and are negative.   PHYSICAL EXAM: Blood pressure (!) 140/94, pulse 97, temperature 98.4 F (36.9 C), temperature source Oral, height 5' 2" (1.575 m), weight 213 lb (96.6 kg), SpO2 98 %. Body mass index is 38.96 kg/m. Physical Exam  Constitutional: She is oriented to person, place, and time. She appears well-developed and well-nourished.  HENT:  Head: Normocephalic.  Eyes: Pupils are equal, round, and reactive to light.  Neck: Normal range of motion.  Pulmonary/Chest: Effort normal.  Neurological: She is alert and oriented to person, place, and time.  Skin: Skin is warm and dry.  Psychiatric: She has a normal mood and affect. Her behavior is normal.  Vitals reviewed.   RECENT LABS AND TESTS: BMET    Component Value Date/Time   NA 140 05/28/2018 0918   K 4.0 05/28/2018 0918   CL 97 05/28/2018 0918   CO2 29 05/28/2018 0918   GLUCOSE 130 (H) 05/28/2018 0918   GLUCOSE 101 (H) 09/12/2017  1121   BUN 10 05/28/2018 0918   CREATININE 0.84 05/28/2018 0918   CREATININE 0.92 09/12/2017 1121   CALCIUM 9.6 05/28/2018 0918   GFRNONAA 85 05/28/2018 0918   GFRNONAA 71 03/17/2017 1513   GFRAA 98 05/28/2018 0918   GFRAA 82 03/17/2017 1513   Lab Results  Component Value Date   HGBA1C 5.6 08/10/2018   HGBA1C 7.4 (H) 05/28/2018   HGBA1C 6.2 02/06/2018   HGBA1C 5.8 07/25/2017   HGBA1C 6.3 (H) 02/26/2017   Lab Results  Component  Value Date   INSULIN 27.1 (H) 05/28/2018   CBC    Component Value Date/Time   WBC 9.3 05/28/2018 0918   WBC 5.3 09/12/2017 1121   RBC 5.50 (H) 05/28/2018 0918   RBC 4.90 09/12/2017 1121   HGB 13.5 05/28/2018 0918   HCT 41.5 05/28/2018 0918   PLT 308 09/12/2017 1121   MCV 76 (L) 05/28/2018 0918   MCH 24.5 (L) 05/28/2018 0918   MCH 25.1 (L) 09/12/2017 1121   MCHC 32.5 05/28/2018 0918   MCHC 32.6 09/12/2017 1121   RDW 18.2 (H) 05/28/2018 0918   LYMPHSABS 2.9 05/28/2018 0918   MONOABS 396 03/17/2017 1513   EOSABS 0.2 05/28/2018 0918   BASOSABS 0.0 05/28/2018 0918   Iron/TIBC/Ferritin/ %Sat    Component Value Date/Time   IRON 59 03/01/2017 2105   TIBC 291 03/01/2017 2105   FERRITIN 47 03/01/2017 2105   IRONPCTSAT 20 03/01/2017 2105   Lipid Panel     Component Value Date/Time   CHOL 185 05/28/2018 0918   TRIG 166 (H) 05/28/2018 0918   HDL 53 05/28/2018 0918   CHOLHDL 2.7 07/25/2017 1130   VLDL 27 02/26/2017 0341   LDLCALC 99 05/28/2018 0918   LDLCALC 70 07/25/2017 1130   Hepatic Function Panel     Component Value Date/Time   PROT 7.0 05/28/2018 0918   ALBUMIN 4.3 05/28/2018 0918   AST 16 05/28/2018 0918   ALT 23 05/28/2018 0918   ALKPHOS 130 (H) 05/28/2018 0918   BILITOT 0.2 05/28/2018 0918      Component Value Date/Time   TSH 1.060 05/28/2018 0918   TSH 0.76 11/30/2017 0954   TSH 0.99 05/24/2017 1629    ASSESSMENT AND PLAN: Type 2 diabetes mellitus without complication, without long-term current use of insulin ([place]) -  Plan: Comprehensive metabolic panel, Insulin, random, Lipid Panel With LDL/HDL Ratio  Vitamin D deficiency - Plan: VITAMIN D 25 Hydroxy (Vit-D Deficiency, Fractures)  Class 2 severe obesity with serious comorbidity and body mass index (BMI) of 39.0 to 39.9 in adult, unspecified obesity type ([place])  PLAN: Diabetes II Sharon Chan has been given extensive diabetes education by myself today including ideal fasting and post-prandial blood glucose readings, individual ideal HgA1c goals  and hypoglycemia prevention. We discussed the importance of good blood sugar control to decrease the likelihood of diabetic complications such as nephropathy, neuropathy, limb loss, blindness, coronary artery disease, and death. We discussed the importance of intensive lifestyle modification including diet, exercise and weight loss as the first line treatment for diabetes. Sharon Chan agrees to continue her diabetes medications and will follow up at the agreed upon time.  Vitamin D Deficiency Sharon Chan was informed that low vitamin D levels contributes to fatigue and are associated with obesity, breast, and colon cancer. She agrees to continue to take prescription Vit D _0 ,000 IU every week and will follow up for routine testing of vitamin D, at least 2-3 times per year. She was informed of the risk of over-replacement of vitamin D and agrees to not increase her dose unless she discusses this with [name] first.  Hyperlipidemia Sharon Chan was informed of the American Heart Association Guidelines emphasizing intensive lifestyle modifications as the first line treatment for hyperlipidemia. We discussed many lifestyle modifications today in depth, and Sharon Chan will continue to work on decreasing saturated fats such as fatty red meat, butter and many fried foods. She will also increase vegetables and lean protein in her diet and continue to work on exercise and weight loss efforts.  Obesity Sharon Chan is currently in the action stage of change. As  such, her goal is to continue with weight loss efforts She has agreed to follow the Category 1 plan Sharon Chan has been instructed to work up to a goal of 150 minutes of combined cardio and strengthening exercise per week for weight loss and overall health benefits. We discussed the following Behavioral Modification Stratagies today: work on meal planning and easy cooking plans, no skipping meals, and travel eating strategies.    Yarelin has agreed to follow up with our clinic in 2 weeks. She was informed of the importance of frequent follow up visits to maximize her success with intensive lifestyle modifications for her multiple health conditions.   OBESITY BEHAVIORAL INTERVENTION VISIT  Today's visit was # 8   Starting weight: 234 lbs Starting date: 05/28/18 Today's weight : Weight: 213 lb (96.6 kg)  Today's date: 09/11/2018 Total lbs lost to date: 21 At least 15 minutes were spent on discussing the following behavioral intervention visit.   ASK: We discussed the diagnosis of obesity with Sharon Chan today and Sharon Chan agreed to give [name] permission to discuss obesity behavioral modification therapy today.  ASSESS: Sharon Chan has the diagnosis of obesity and her BMI today is 39.1 Sharon Chan is in the action stage of change   ADVISE: Sharon Chan was educated on the multiple health risks of obesity as well as the benefit of weight loss to improve her health. She was advised of the need for long term treatment and the importance of lifestyle modifications to improve her current health and to decrease her risk of future health problems.  AGREE: Multiple dietary modification options and treatment options were discussed and  Sharon Chan agreed to follow the recommendations documented in the above note.  ARRANGE: Sharon Chan was educated on the importance of frequent visits to treat obesity as outlined per CMS and USPSTF guidelines and agreed to schedule her next follow up appointment today.  I,  April Moore, am acting as Location manager for [employer], PA-C I, Abby Potash, PA-C have reviewed above note and agree with its content

## 2018-09-12 LAB — COMPREHENSIVE METABOLIC PANEL
ALBUMIN: 3.9 g/dL (ref 3.5–5.5)
ALT: 11 IU/L (ref 0–32)
AST: 9 IU/L (ref 0–40)
Albumin/Globulin Ratio: 1.5 (ref 1.2–2.2)
Alkaline Phosphatase: 98 IU/L (ref 39–117)
BUN / CREAT RATIO: 15 (ref 9–23)
BUN: 15 mg/dL (ref 6–24)
Bilirubin Total: 0.3 mg/dL (ref 0.0–1.2)
CALCIUM: 9.1 mg/dL (ref 8.7–10.2)
CO2: 27 mmol/L (ref 20–29)
CREATININE: 1.03 mg/dL — AB (ref 0.57–1.00)
Chloride: 100 mmol/L (ref 96–106)
GFR calc non Af Amer: 66 mL/min/{1.73_m2} (ref >59)
GFR, EST AFRICAN AMERICAN: 76 mL/min/{1.73_m2} (ref >59)
GLUCOSE: 90 mg/dL (ref 65–99)
Globulin, Total: 2.6 g/dL (ref 1.5–4.5)
Potassium: 3.9 mmol/L (ref 3.5–5.2)
Sodium: 142 mmol/L (ref 134–144)
TOTAL PROTEIN: 6.5 g/dL (ref 6.0–8.5)

## 2018-09-12 LAB — LIPID PANEL WITH LDL/HDL RATIO
Cholesterol, Total: 163 mg/dL (ref 100–199)
HDL: 40 mg/dL (ref >39)
LDL Calculated: 105 mg/dL — ABNORMAL HIGH (ref 0–99)
LDL/HDL RATIO: 2.6 ratio (ref 0.0–3.2)
TRIGLYCERIDES: 90 mg/dL (ref 0–149)
VLDL Cholesterol Cal: 18 mg/dL (ref 5–40)

## 2018-09-12 LAB — VITAMIN D 25 HYDROXY (VIT D DEFICIENCY, FRACTURES): VIT D 25 HYDROXY: 32.7 ng/mL (ref 30.0–100.0)

## 2018-09-12 LAB — INSULIN, RANDOM: INSULIN: 13.7 u[IU]/mL (ref 2.6–24.9)

## 2018-09-16 NOTE — Progress Notes (Signed)
NEUROPSYCHOLOGICAL EVALUATION   Name:    Sharon Chan  Date of Birth:   04-29-1976 Date of Interview:  08/14/2018 Date of Testing:  08/15/2018   Date of Feedback:  09/17/2018       Background Information:  Reason for Referral:  Sharon Chan is a 42 y.o. female referred by Dr. Ellouise Newer to assess her current level of cognitive functioning and assist in differential diagnosis. Sharon current evaluation consisted of a review of available medical records, an interview with Sharon Chan, and Sharon completion of a neuropsychological testing battery. Informed consent was obtained.  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 Brain MRI on 02/25/2017 reportedly revealed Sharon following: acute nonhemorrhagic infarcts involving Sharon left thalamus measures 9 mm maximally; remote infarcts involving Sharon basal ganglia bilaterally, scattered white matter changes bilaterally are mildly advanced for age, this likely also reflects Sharon sequela of microvascular ischemia or vasculitis; remote infarcts involving Sharon body of Sharon corpus callosum. MRA head showed extensive medium and small vessel disease. Following Sharon stroke, Sharon Chan was seen by Dr. Leonie Man in May 2018 and then by Dr. Delice Lesch in December 2018. She underwent outpatient PT, OT and ST through neurorehab. She reported, and continues to report, daily headaches, right sided numbness and weakness, dizziness and falls. She was utilizing a cane to walk but has been able to go without that for Sharon past month. She reports falls have been less frequent. Repeat MRI was ordered by Dr. Delice Lesch in December given that Sharon degree of weakness on her right side was felt to be unexplained by a thalamic infarct, but this has not been performed. Sharon Chan has also been experiencing depression and anxiety since Sharon stroke. Dr. Delice Lesch prescribed Cymbalta for pain since that could help with  depression as well. Sharon Chan reports she continues to take Cymbalta. She also reported that her PCP recently prescribed bupropion, and she is "alternating" Sharon Cymbalta and bupropion (every other day). She noted Sharon only effect she has observed of Sharon Cymbalta is increased appetite, which she does not like as she is trying to lose weight. She is also on Topamax, she tells me this is for her history of post-stroke seizures. Sharon Chan is diagnosed with Type II diabetes and on Metformin but Hgb A1c was within normal range (5.6) on 08/10/2018, down from 7.4 on 05/28/2018.  Sharon Chan reported that prior to her stroke she had no concerns about her cognitive functioning. She was not working at Sharon time (was on disability for another condition) but she was living alone and managing all instrumental ADLs with no difficulty. She reported significant cognitive difficulty after Sharon stroke upon her return home. She reported trouble "even with basic thinks like how to take a shower and cooking". This has improved. However, she continues to have significant forgetfulness. She feels this for both recent and long-term episodic memory. She mentioned that she can hardly remember much of her childhood now, and this wasn't an issue prior to Sharon stroke. She also notes facial recognition difficulty. She frequently forgets if she has turned off Sharon stove or Sharon lights so she "has to check a million times".   Upon direct questioning, she reports Sharon following with regard to current cognitive functioning (compared to pre-stroke functioning): Misplacing/losing items: Sometimes Forgetting appointments or other obligations: Sometimes Forgetting to take medications: Sometimes - she does have alarms set. At one point she was double-taking  medications because she couldn't remember if she had taken them or not. Now she has a system now that is working. Difficulty concentrating: Yes Starting but not finishing tasks: Yes Distracted  easily: Yes Processing information more slowly: Yes (she also notes she has hearing difficulty on Sharon right side since Sharon stroke) Word-finding difficulty: Yes. She also notes that she was "very fluent" in [place] before Sharon stroke and completely lost her Spanish fluency afterwards.  Word substitutions: Yes Writing difficulty: No Spelling difficulty: Some Comprehension difficulty: No Getting lost when driving: Yes (and losing her car in parking lot - Sunday night she "broke down crying" at [facility] when she couldn't find her car; after a while she remembered where it was) Making wrong turns when driving: No Uncertain about directions when driving or passenger: Yes  Sharon Chan lives alone and manages all instrumental ADLs. She has returned to driving short distances only. She does not do any night driving. She manages her medications, cooking, and appointments. She manages her finances. She notes she has forgotten to pay some bills, and had to put everything on automatic withdrawal so she would not miss payments. She notes that numbers are sometimes difficulty for her since Sharon stroke.  Sharon Chan denied any psychiatric history prior to Sharon stroke. She denied former mental health diagnoses or treatment. She denied history of substance abuse or dependence. She reported significant mood changes since Sharon stroke. She is much more tearful. She often wakes up crying for no specific reason. She feels hopeless occasionally. She has not been spending time with her friends which she used to do a couple of times a week before Sharon stroke. At first she was embarrassed by her physical and cognitive changes, but now she just does "not want to talk about Sharon stroke all Sharon time and they do". She is now walking every day and that has been helping her mood. She also goes to her pastor for counseling which she states is very supportive. She is not currently seeing a therapist, I see she was working with Dr. Mallie Mussel,  psychologist in [facility], as recently as July 2019. She notes that she is also close with her sister and talks to her regularly. Sharon Chan denies suicidal ideation or intention. She states she "could never do that" to her family because her father committed suicide in 2003 and she sees how that has affected her family. She noted that this past weekend was Sharon anniversary of her father's suicide. Also of note, Sharon Chan's brother was murdered 3 years after their father committed suicide.  Sharon Chan also reported significantly increased anxiety since Sharon stroke. She does not want to leave her home, and being in crowds overwhelms her. She has started doing her grocery shopping in Sharon middle of Sharon night to avoid crowds. She also has significant anxiety about Sharon possibility of having another stroke. She will feel pressure in her head or have numbness in her arm or mouth and get scared that she is having another stroke. She tries not to go into a panic attack and talks herself down.  She noted that for Sharon last 8 years she would have a sensation that "blood is dripping in my head, and I could almost hear it". She states this sensation was "exactly where Sharon stroke happened". She still experiences that sensation.   Since Sharon stroke Sharon Chan reports significant insomnia. She reports she is only achieving 1-2 hours of sleep per night. She does not  get tired, even with Ambien. She can never fall asleep before 4 am. Then she will get a chunk about about 2 hours of sleep. She sometimes feels very groggy upon awakening and wishes she could get another hour of sleep but can't. Once she is up she does not feel tired. She does not nap during Sharon day. She wishes she could.  She notes that due to having a headache all day every day since Sharon stroke, she has been keeping her house dark. This could be contributing to circadian rhythm being off. She has had vitamin D deficiency possibly related to this  as well. However recently she has been forcing herself to go walk in Sharon park each morning.   Sharon Chan denies any visual or auditory hallucinations.   Social History: Born/Raised: [place] No learning difficulties that knows of but was in speech therapy in first to fifth grade (thick tongue) Education: College degree - great grades In school now (PhD in divinity - want to be a youth pastor), only taking one class, that's all I can handle, it's taken a lot but am getting an A.  Online course. Have to watch lectures 2-3 times. Occupational history: On disability (since 2003), previously was an Statistician until 2003 Marital history: Never married, no children  Alcohol: None Tobacco: Never   Medical History:  Past Medical History:  Diagnosis Date  . CVA (cerebral vascular accident) ([place])   . Depression   . DM type 2 (diabetes mellitus, type 2) ([place])   . HLD (hyperlipidemia)   . HTN (hypertension)   . Joint pain   . Migraines   . Neck pain   . Polycystic disease, ovaries   . Right sided weakness   . Seizures ([place])   . Shoulder pain   . Sickle cell trait ([place])   . Thyroid nodule     Current medications:  Outpatient Encounter Medications as of 09/17/2018  Medication Sig  . acetaminophen (TYLENOL) 325 MG tablet Take 1-2 tablets (325-650 mg total) by mouth every 4 (four) hours as needed for mild pain.  Marland Kitchen aspirin EC 325 MG tablet Take 1 tablet (325 mg total) daily by mouth.  Marland Kitchen atorvastatin (LIPITOR) 40 MG tablet Take 1 tablet (40 mg total) by mouth daily at 6 PM.  . BD PEN NEEDLE NANO U/F 32G X 4 MM MISC 1 PACKAGE BY DOES NOT APPLY ROUTE 2 (TWO) TIMES DAILY.  . blood glucose meter kit and supplies KIT Dispense based on Chan and insurance preference. Use up to four times daily as directed. (FOR ICD-9 250.00, 250.01).  Marland Kitchen buPROPion (WELLBUTRIN XL) 150 MG 24 hr tablet TAKE 1 TABLET BY MOUTH EVERY DAY  . cyclobenzaprine (FLEXERIL) 5 MG tablet TAKE 1 TABLET 3 TIMES A DAY AS  NEEDED FOR MUSCLE SPASM SHOULDER/NECK PAIN  . diphenhydrAMINE (BENADRYL ALLERGY) 25 MG tablet Take 25 mg by mouth every 6 (six) hours as needed.  . DULoxetine (CYMBALTA) 30 MG capsule TAKE 1 CAPSULE BY MOUTH EVERYDAY AT BEDTIME  . gabapentin (NEURONTIN) 300 MG capsule TAKE 1 TABLET IN Sharon MORNING AND 2 TABLETS AT BEDTIME DAILY  . glucose blood (ONE TOUCH ULTRA TEST) test strip TEST TWICE DAILY  . liraglutide (VICTOZA) 18 MG/3ML SOPN Inject 0.1 mLs (0.6 mg total) into Sharon skin every morning.  Marland Kitchen losartan (COZAAR) 25 MG tablet TAKE 1 TABLET BY MOUTH EVERY DAY  . metFORMIN (GLUCOPHAGE) 500 MG tablet TAKE 1 TABLET (500 MG TOTAL) BY MOUTH 2 (TWO) TIMES DAILY  WITH A MEAL.  Marland Kitchen ONETOUCH DELICA LANCETS 63Z MISC 1 Package by Does not apply route 2 (two) times daily.  Marland Kitchen topiramate (TOPAMAX) 100 MG tablet Take 1 tablet (100 mg total) by mouth 2 (two) times daily. Take 132m in am and 2059mat night  . Vitamin D, Ergocalciferol, (DRISDOL) 50000 units CAPS capsule Take 1 capsule (50,000 Units total) by mouth every 7 (seven) days.  . Marland Kitchenolpidem (AMBIEN) 5 MG tablet TAKE 1 TABLET BY MOUTH EVERY DAY AT BEDTIME AS NEEDED FOR SLEEP   No facility-administered encounter medications on file as of 09/17/2018.    Chan states she is alternating Cymbalta every other day with bupropion.   Current Examination:  Behavioral Observations:  Appearance: Neatly and appropriately dressed and groomed Gait: Ambulated independently, no gross abnormalities observed Speech: Fluent; normal rate, rhythm and volume. No significant word finding difficulty was observed during conversational speech and interview. Thought process: Linear, goal directed Affect: Full, anxious Interpersonal: Very pleasant, appropriate Orientation: Oriented to all spheres. Accurately named Sharon current President and his predecessor.   Tests Administered: . Test of Premorbid Functioning (TOPF) . Wechsler Adult Intelligence Scale-Fourth Edition (WAIS-IV):  Similarities, Block Design, Matrix Reasoning, Information, Arithmetic, Symbol Search, Coding and Digit Span subtests . Wechsler Memory Scale-Fourth Edition (WMS-IV) Adult Version (ages 1668-69 Logical Memory I, II and Recognition subtests  . CaWisconsinerbal Learning Test - 2nd Edition (CVLT-2) Standard Form . WiLandAmerica FinancialWCST) . Repeatable Battery for Sharon Assessment of Neuropsychological Status (RBANS) Form A:  Figure Copy and Recall Subtests . BoAshlandBNT) . Controlled Oral Word Association Test (COWAT) . Trail Making Test A and B . Boston Diagnostic Aphasia Examination: Complex Ideational Material . BeOlevia Bowensepression Inventory - Second edition (BDI-II) . Personality Assessment Inventory (PAI)  Test Results: Note: Standardized scores are presented only for use by appropriately trained professionals and to allow for any future test-retest comparison. These scores should not be interpreted without consideration of all Sharon information that is contained in Sharon rest of Sharon report. Sharon most recent standardization samples from Sharon test publisher or other sources were used whenever possible to derive standard scores; scores were corrected for age, gender, ethnicity and education when available.   Test Scores:  Test Name Raw Score Standardized Score Descriptor  TOPF 52/70 SS= 109 Average  WAIS-IV Subtests     Similarities 24/36 ss= 9 Average  Block Design 30/66 ss= 7 Low average  Matrix Reasoning 9/26 ss= 5 Borderline  Information 13/26 ss= 10 Average  Arithmetic 10/22 ss= 7 Low average  Symbol Search 24/60 ss= 7 Low average  Coding 37/135 ss= 4 Impaired  Digit Span 30/48 ss= 11 Average  WAIS-IV Index Scores     Verbal Comprehension  SS= 98 Average  Perceptual Reasoning  SS= 77 Borderline  Working Memory  SS= 95 Average  Processing Speed  SS= 76 Borderline  Full Scale IQ (8 subtest)  SS= 82 Low average  WMS-IV Subtests     LM I 28/50 ss= 11 Average  LM II  24/50 ss= 11 Average  LM II Recognition 26/30 Cum %: 51-75 WNL  CVLT-II Scores     Trial 1 6/16 Z= -1 Low average  Trial 5 12/16 Z= -0.5 Average   Trials 1-5 total 47/80 T= 42 Low average  SD Free Recall 10/16 Z= -1 Low average  SD Cued Recall 13/16 Z= 0 Average  LD Free Recall 12/16 Z= -0.5 Average  LD Cued Recall 14/16 Z= 0 Average  Recognition Discriminability 15/16 hits 1 false positive Z= 0 Average  Forced Choice Recognition 16/16  WNL  WCST     Total Errors 26 T= 35 Borderline  Perseverative Responses 15 T= 34 Borderline   Perseverative Errors 14 T= 33 Borderline  Conceptual Level Responses 24 T= 29 Impaired  Categories Completed 1 6-10% Impaired  Trials to Complete 1st Category 10 >16% WNL  Failure to Maintain Set 1    RBANS Subtests     Figure Copy 17/20 Z= -0.9 Low average  Figure Recall 9/20 Z= -1.4 Borderline  BNT 58/60 T= 56 Average  COWAT-FAS 65 T= 68 Superior  COWAT-Animals 26 T= 58 High average  Trail Making Test A  27" 0 errors T= 54 Average  Trail Making Test B  41" 0 errors T= 63 Superior  BDAE Complex Ideational Material 11/12    BDI-II 20/63  Moderate  PAI (Only elevated clinical scales are shown here)     NIM  T= 77   SOM  T= 85      Description of Test Results:  Embedded performance validity indicators were within normal limits. As such, Sharon Chan's current performance on neurocognitive testing is judged to be a relatively accurate representation of her current level of neurocognitive functioning.   Premorbid verbal intellectual abilities were estimated to have been within Sharon average range based on a test of word reading. Current full scale IQ fell within Sharon low average range. There was a statistically significant discrepancy between verbal and non-verbal IQ, with verbal IQ falling in Sharon average range and non-verbal IQ falling in Sharon borderline range.  Psychomotor processing speed was borderline.   Auditory attention and working memory were  average.   Visual-spatial construction was low average on both a block construction task and a copy drawing task.   Language abilities assessed were intact. Specifically, confrontation naming was average, and semantic verbal fluency was high average. Auditory comprehension of complex ideational material was within normal limits.   With regard to verbal memory, encoding and acquisition of non-contextual information (i.e., word list) was low average across five learning trials. After a brief interference task, free recall was low average (10/16 items). With semantic cueing, she recalled an additional 3 items (average). After a delay, free recall was average (12/16 items). Cued recall was average (14/16 items). Performance on a yes/no recognition task was average. On another verbal memory test, encoding and acquisition of contextual auditory information (i.e., short stories) was average. After a delay, free recall was average. Performance on a yes/no recognition task was within normal limits. With regard to non-verbal memory, delayed free recall of visual information was borderline.   Executive functioning was variable, ranging from impaired (mainly on non-verbal tasks) to intact (on verbal tasks). Mental flexibility and set-shifting were superior on Trails B. Verbal fluency with phonemic search restrictions was superior. Verbal abstract reasoning was average. Non-verbal abstract reasoning was borderline. Deductive reasoning and a non-verbal task was impaired. She quickly identified and achieved Sharon first rule/pattern but was unable to successfully shift to a new rule/pattern.   On a self-report measure of mood, Sharon Chan's responses were indicative of clinically significant depression at Sharon present time. Symptoms endorsed included: sadness, pessimism, self-criticalness, tearfulness, restlessness, loss of interest, indecisiveness, feelings of worthlessness, loss of energy, sleep difficulty, reduced  appetite, concentration difficulty, fatigue. She denied suicidal ideation or intention.   She was also administered a more extensive screening for psychopathology and personality disorders (PAI). On this measure, certain symptom validity  indicators fell outside of Sharon normal range, suggesting that Sharon Chan may not have answered in a completely forthright manner.  Sharon Chan's response patterns are unusual in that they indicate a defensiveness about particular personal shortcomings as well as an exaggeration of certain problems. As such, interpretive hypotheses must be made with caution.  Sharon PAI clinical profile is marked by a significant elevation on Sharon Somatization scale, indicating that Sharon content tapped by this scale may reflect a particular area of difficulty for Sharon Chan. Sharon Chan demonstrates an unusual degree of concern about physical functioning and health matters and probable impairment arising from somatic symptoms.  She is likely to report that her daily functioning has been compromised by numerous and varied physical problems.  She feels that her health is not as good as that of her age peers and likely believes that her health problems are complex and difficult to treat successfully.  Sharon Chan indicates that she occasionally experiences, or may experience to a mild degree, maladaptive behavior patterns aimed at controlling anxiety.  She may be seen by others as being something of a perfectionist.  She is likely to be a fairly rigid individual who follows her personal guidelines for conduct in an inflexible and unyielding manner.  She ruminates about matters to Sharon degree that she often has difficulty making decisions and perceiving Sharon larger significance of decisions that are made.  Changes in routine, unexpected events, and contradictory information are likely to generate untoward stress.  She may fear her own impulses and doubt her ability to control them. Sharon Chan reports  some difficulties consistent with relatively mild or transient depressive symptomatology.   According to Sharon Chan's self-report, she describes NO significant problems in Sharon following areas: unusual thoughts or peculiar experiences; antisocial behavior; problems with empathy; undue suspiciousness or hostility; extreme moodiness and impulsivity; unusually elevated mood or heightened activity; marked anxiety.  Also, she reports NO significant problems with alcohol or drug abuse or dependence.  With respect to suicidal ideation, Sharon Chan is NOT reporting distress from thoughts of self-harm.   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 Verbally mediated tasks such as verbal memory, confrontation naming, verbal fluency, verbal abstract reasoning and auditory attention were intact. Control and instrumentation engineer, visual memory, and nonverbal deductive reasoning were impaired. Processing speed was impaired. Overall her cognitive profile is lateralizing and concerning for right hemisphere dysfunction.  Sharon Chan reports acute onset of cognitive dysfunction following her left thalamic stroke in April 2018. Of note, however, her cognitive profile is lateralizing and concerning for RIGHT hemisphere dysfunction. While this is not typically associated with left thalamic stroke, there have been case reports of crossed right hemisphere syndrome in left thalamic stroke patients (Marchetti et al., 2005). It is also possible that Sharon Chan has had subsequent infarcts in Sharon right hemisphere (she has not had updated neuroimaging since April 2018).  Regardless of cause, results of cognitive testing revealed several areas of impairment, and Sharon Chan reports this has interfered with her ability to manage instrumental ADLs (eg driving, bill payment). As such, diagnostic criteria for a major neurocognitive disorder are met.  While I believe there  is organic impairment, I also believe there could be a strong psychological component to her symptoms perhaps exacerbating underlying dysfunction. She has a history of psychological trauma that she has perhaps not processed and has experienced new onset of post-stroke depression and anxiety. Additionally, severe insomnia, daily headaches and effects of Topamax could be  affecting cognitive functioning in daily life to a significant degree.    Recommendations/Plan: Based on Sharon findings of Sharon present evaluation, Sharon following recommendations are offered:  1. Repeat MRI to look for interval changes especially any right hemisphere involvement. This was ordered by Dr. Delice Lesch but Sharon Chan states she was never called to schedule. 2. Treatment for depression and anxiety: She is currently receiving pastoral counseling and she is walking daily for exercise, and she is encouraged to continue both of these activities as they will also help mood/coping. However, I also think she would benefit from psychiatry consultation regarding her medications. At Sharon very least instructions for her psychopharmacological drugs should be reviewed with her, as she is currently alternating bupropion and Cymbalta every other day and thus may not be maintaining therapeutic levels. Finally, I recommend she see a psychologist/therapist again for ongoing counseling to assist with post-stroke adjustment and perhaps insomnia as well. She does attend a stroke support group through her church. 3. Optimal control of vascular risk factors: She was reinforced for her significant behavioral changes to address her vascular risk factors. She has significantly reduced her A1c, is losing weight and sticking to her meal plan, and is walking regularly. She was encouraged to keep up Sharon good work and she was reassured that these efforts will help reduce Sharon risk of future stroke. 4. An additional round of cognitive rehabilitation may be useful,  especially given results of Sharon current evaluation which suggest visual-spatial processing may need more rehabilitative effort.   Feedback to Chan: KIRAN LAPINE returned for a feedback appointment on 09/17/2018 to review Sharon results of her neuropsychological evaluation with this provider. 40 minutes face-to-face time was spent reviewing her test results, my impressions and my recommendations as detailed above.    Total time spent on this Chan's case: 95 minutes for neurobehavioral status exam with psychologist (CPT code [phone], [phone]); 225 minutes of testing/scoring by psychometrician under psychologist's supervision (CPT codes [phone], [phone] units); 240 minutes for integration of Chan data, interpretation of standardized test results and clinical data, clinical decision making, treatment planning and preparation of this report, and interactive feedback with review of results to Sharon Chan/family by psychologist (CPT codes [phone], [phone] units).      Thank you for your referral of AWILDA COVIN. Please feel free to contact me if you have any questions or concerns regarding this report.

## 2018-09-17 ENCOUNTER — Encounter: Payer: Self-pay | Admitting: Psychology

## 2018-09-17 ENCOUNTER — Ambulatory Visit (INDEPENDENT_AMBULATORY_CARE_PROVIDER_SITE_OTHER): Payer: Medicare Other | Admitting: Psychology

## 2018-09-17 DIAGNOSIS — I6381 Other cerebral infarction due to occlusion or stenosis of small artery: Secondary | ICD-10-CM | POA: Diagnosis not present

## 2018-09-17 DIAGNOSIS — R413 Other amnesia: Secondary | ICD-10-CM | POA: Diagnosis not present

## 2018-09-17 DIAGNOSIS — I679 Cerebrovascular disease, unspecified: Secondary | ICD-10-CM

## 2018-09-17 DIAGNOSIS — F0150 Vascular dementia without behavioral disturbance: Secondary | ICD-10-CM | POA: Diagnosis not present

## 2018-09-17 DIAGNOSIS — F0390 Unspecified dementia without behavioral disturbance: Secondary | ICD-10-CM

## 2018-10-01 ENCOUNTER — Encounter: Payer: Self-pay | Admitting: Family Medicine

## 2018-10-01 ENCOUNTER — Ambulatory Visit (INDEPENDENT_AMBULATORY_CARE_PROVIDER_SITE_OTHER): Payer: Medicare Other | Admitting: Family Medicine

## 2018-10-01 VITALS — BP 138/92 | HR 94 | Temp 98.2°F | Ht 62.0 in | Wt 216.0 lb

## 2018-10-01 DIAGNOSIS — R42 Dizziness and giddiness: Secondary | ICD-10-CM | POA: Diagnosis not present

## 2018-10-01 DIAGNOSIS — I6930 Unspecified sequelae of cerebral infarction: Secondary | ICD-10-CM | POA: Diagnosis not present

## 2018-10-01 DIAGNOSIS — E119 Type 2 diabetes mellitus without complications: Secondary | ICD-10-CM | POA: Diagnosis not present

## 2018-10-01 DIAGNOSIS — Z09 Encounter for follow-up examination after completed treatment for conditions other than malignant neoplasm: Secondary | ICD-10-CM | POA: Diagnosis not present

## 2018-10-01 DIAGNOSIS — R519 Headache, unspecified: Secondary | ICD-10-CM

## 2018-10-01 DIAGNOSIS — Z0000 Encounter for general adult medical examination without abnormal findings: Secondary | ICD-10-CM

## 2018-10-01 DIAGNOSIS — R0602 Shortness of breath: Secondary | ICD-10-CM

## 2018-10-01 DIAGNOSIS — I10 Essential (primary) hypertension: Secondary | ICD-10-CM | POA: Diagnosis not present

## 2018-10-01 DIAGNOSIS — G4700 Insomnia, unspecified: Secondary | ICD-10-CM | POA: Diagnosis not present

## 2018-10-01 DIAGNOSIS — R51 Headache: Secondary | ICD-10-CM | POA: Diagnosis not present

## 2018-10-01 DIAGNOSIS — Z23 Encounter for immunization: Secondary | ICD-10-CM

## 2018-10-01 LAB — POCT GLYCOSYLATED HEMOGLOBIN (HGB A1C): Hemoglobin A1C: 5.6 % (ref 4.0–5.6)

## 2018-10-01 LAB — POCT URINALYSIS DIP (MANUAL ENTRY)
Bilirubin, UA: NEGATIVE
Blood, UA: NEGATIVE
Glucose, UA: NEGATIVE mg/dL
Ketones, POC UA: NEGATIVE mg/dL
Nitrite, UA: NEGATIVE
Protein Ur, POC: NEGATIVE mg/dL
Spec Grav, UA: 1.01 (ref 1.010–1.025)
Urobilinogen, UA: 0.2 E.U./dL
pH, UA: 5.5 (ref 5.0–8.0)

## 2018-10-01 MED ORDER — ALBUTEROL SULFATE HFA 108 (90 BASE) MCG/ACT IN AERS: 2.0000 | INHALATION_SPRAY | Freq: Four times a day (QID) | RESPIRATORY_TRACT | 11 refills | Status: DC | PRN

## 2018-10-01 NOTE — Progress Notes (Signed)
Follow Up  Subjective:    Patient ID: Sharon Chan, female    DOB: 1976-01-29, 42 y.o.   MRN: [id number]   Chief Complaint  Patient presents with  . Medicare Wellness    HPI  Sharon Chan is a 42 year old female with a past medical history of Sickle Cell Trait, Thyroid Nodule, Shoulder Pain, Seizures, Right Sided Weakness, Polycystic Disease, Neck Pain, Migraines, Joint Pain, Hypertension, Hyperlipidemia, Diabetes, Depression, and CVA she is here today for follow up of chronic disease.   Current Status: Since her last office visit, she is doing well but continues to have chronic pain in her knees and shoulder. She takes Acetaminophen with minimal relief. She has moderate residual right sided extremity weakness. She continues to have mild headaches. She continue to follow up with [facility]. He next follow up appointment is 10/02/2018. She continues to have moderate anxiety concerning her health. No depression or anxiety. She denies suicidal ideations, homicidal ideations, or auditory hallucinations  She denies fevers, chills, recent infections, weight loss, and night sweats. She has not had any visual changes, and falls. No chest pain, heart palpitations, and cough reported. No reports of GI problems such as nausea, vomiting, diarrhea, and constipation. She has no reports of blood in stools, dysuria and hematuria. She denies pain today.   Review of Systems  Constitutional: Positive for fatigue.  HENT: Negative.   Respiratory: Positive for shortness of breath (Occasional).   Cardiovascular: Negative.   Gastrointestinal: Positive for abdominal distention (obese) and diarrhea (Occasional ).  Endocrine: Negative.   Genitourinary: Negative.   Musculoskeletal: Positive for arthralgias (Generalized. ).  Skin: Negative.   Neurological: Positive for dizziness, weakness and headaches.  Psychiatric/Behavioral: The patient is nervous/anxious (Moderate).    Objective:   Physical Exam  Constitutional: She is oriented to person, place, and time. She appears well-developed and well-nourished.  HENT:  Head: Normocephalic and atraumatic.  Right Ear: External ear normal.  Eyes: Pupils are equal, round, and reactive to light. Conjunctivae and EOM are normal.  Neck: Normal range of motion. Neck supple.  Cardiovascular: Normal rate, regular rhythm, normal heart sounds and intact distal pulses.  Pulmonary/Chest: Effort normal and breath sounds normal.  Abdominal: Soft. Bowel sounds are normal.  Musculoskeletal: Normal range of motion.  Neurological: She is alert and oriented to person, place, and time.  Skin: Skin is warm.  Psychiatric: She has a normal mood and affect. Her behavior is normal. Judgment and thought content normal.  Nursing note and vitals reviewed.  Assessment & Plan:   1. Encounter for Medicare annual wellness exam - POCT urinalysis dipstick  2. Type 2 diabetes mellitus without complication, without long-term current use of insulin (HCC) She will continue to decrease foods/beverages high in sugars and carbs and follow Heart Healthy or DASH diet. Increase physical activity to at least 30 minutes cardio exercise daily.   She will continue [facility] program. We will evaluate the need to discontinue anti-diabetic medications.   We will get Diabetes Foot Exam at next office visit.   - POCT glycosylated hemoglobin (Hb A1C)  3. Essential hypertension Continue antihypertensive medications as prescribed. She will continue to decrease high sodium intake, excessive alcohol intake, increase potassium intake, smoking cessation, and increase physical activity of at least 30 minutes of cardio activity daily. She will continue to follow Heart Healthy or DASH diet.  4. Shortness of breath - albuterol (PROVENTIL HFA;VENTOLIN HFA) 108 (90 Base) MCG/ACT inhaler; Inhale 2 puffs  into the lungs every 6 (six) hours as needed for wheezing or  shortness of breath.  Dispense: 1 Inhaler; Refill: 11  5. History of CVA with residual deficit Residual right sided extremity weakness. Uses cane for support.   6. Dizziness Probable r/t to recent Stroke. She will ambulate using cane for support. Monitor.   7. Frequent headaches Stable. Mild. Continue OTC medications for relief.   8. Insomnia, unspecified type Continue medications for sleep as prescribed.   9. Need for immunization against influenza - Flu Vaccine QUAD 36+ mos IM  10. Follow up She will follow up in 3 months.   Meds ordered this encounter  Medications  . albuterol (PROVENTIL HFA;VENTOLIN HFA) 108 (90 Base) MCG/ACT inhaler    Sig: Inhale 2 puffs into the lungs every 6 (six) hours as needed for wheezing or shortness of breath.    Dispense:  1 Inhaler    Refill:  11    Raliegh IpNatalie Jahking Lesser,  MSN, [facility]NP-C Patient [facility] [address]  [place], [place] [id number] [phone]

## 2018-10-02 ENCOUNTER — Ambulatory Visit (INDEPENDENT_AMBULATORY_CARE_PROVIDER_SITE_OTHER): Payer: Medicare Other | Admitting: Physician Assistant

## 2018-10-02 ENCOUNTER — Encounter (INDEPENDENT_AMBULATORY_CARE_PROVIDER_SITE_OTHER): Payer: Self-pay | Admitting: Physician Assistant

## 2018-10-02 VITALS — BP 165/94 | HR 98 | Temp 98.5°F | Ht 62.0 in | Wt 215.0 lb

## 2018-10-02 DIAGNOSIS — E119 Type 2 diabetes mellitus without complications: Secondary | ICD-10-CM | POA: Diagnosis not present

## 2018-10-02 DIAGNOSIS — Z6839 Body mass index (BMI) 39.0-39.9, adult: Secondary | ICD-10-CM

## 2018-10-02 DIAGNOSIS — E7849 Other hyperlipidemia: Secondary | ICD-10-CM

## 2018-10-02 MED ORDER — LIRAGLUTIDE 18 MG/3ML ~~LOC~~ SOPN: 1.2000 mg | PEN_INJECTOR | SUBCUTANEOUS | 0 refills | Status: DC

## 2018-10-02 MED ORDER — METFORMIN HCL 500 MG PO TABS: 500.0000 mg | ORAL_TABLET | Freq: Two times a day (BID) | ORAL | 3 refills | Status: DC

## 2018-10-04 NOTE — Progress Notes (Signed)
Office: [phone]  /  Fax: [phone]   HPI:   Chief Complaint: OBESITY Sharon Chan is here to discuss her progress with her obesity treatment plan. She is on the Category 1 plan and is following her eating plan approximately 100 % of the time. She states she is walking and doing yoga for 60 minutes 7 times per week. Sharon Chan just returned from [place]. She reports eating more fruit than normal due to lack of choices. She is ready to get back on track.  Her weight is 215 lb (97.5 kg) today and has gained 2 pounds since her last visit. She has lost 19 lbs since starting treatment with [name].  Diabetes II Sharon Chan has a diagnosis of diabetes type II. Sharon Chan last A1c was 5.6. She states fasting BGs range between 80-98 and she denies hypoglycemia. She has been working on intensive lifestyle modifications including diet, exercise, and weight loss to help control her blood glucose levels.  Hyperlipidemia Sharon Chan has hyperlipidemia and has been trying to improve her cholesterol levels with intensive lifestyle modification including a low saturated fat diet, exercise and weight loss. Her last TG level was not at goal. She is on atorvastatin and denies any chest pain, claudication or myalgias.  ALLERGIES: Allergies  Allergen Reactions  . Penicillins Anaphylaxis    MEDICATIONS: Current Outpatient Medications on File Prior to Visit  Medication Sig Dispense Refill  . acetaminophen (TYLENOL) 325 MG tablet Take 1-2 tablets (325-650 mg total) by mouth every 4 (four) hours as needed for mild pain.    Marland Kitchen albuterol (PROVENTIL HFA;VENTOLIN HFA) 108 (90 Base) MCG/ACT inhaler Inhale 2 puffs into the lungs every 6 (six) hours as needed for wheezing or shortness of breath. 1 Inhaler 11  . atorvastatin (LIPITOR) 40 MG tablet Take 1 tablet (40 mg total) by mouth daily at 6 PM. 30 tablet 6  . BD PEN NEEDLE NANO U/F 32G X 4 MM MISC 1 PACKAGE BY DOES NOT APPLY ROUTE 2 (TWO) TIMES DAILY. 100 each 0  . blood glucose  meter kit and supplies KIT Dispense based on patient and insurance preference. Use up to four times daily as directed. (FOR ICD-9 250.00, 250.01). 1 each 0  . buPROPion (WELLBUTRIN XL) 150 MG 24 hr tablet TAKE 1 TABLET BY MOUTH EVERY DAY 30 tablet 0  . cyclobenzaprine (FLEXERIL) 5 MG tablet TAKE 1 TABLET 3 TIMES A DAY AS NEEDED FOR MUSCLE SPASM SHOULDER/NECK PAIN 90 tablet 6  . diphenhydrAMINE (BENADRYL ALLERGY) 25 MG tablet Take 25 mg by mouth every 6 (six) hours as needed.    . DULoxetine (CYMBALTA) 30 MG capsule TAKE 1 CAPSULE BY MOUTH EVERYDAY AT BEDTIME 30 capsule 4  . gabapentin (NEURONTIN) 300 MG capsule TAKE 1 TABLET IN THE MORNING AND 2 TABLETS AT BEDTIME DAILY 90 capsule 3  . glucose blood (ONE TOUCH ULTRA TEST) test strip TEST TWICE DAILY 100 each 0  . losartan (COZAAR) 25 MG tablet TAKE 1 TABLET BY MOUTH EVERY DAY 30 tablet 0  . ONETOUCH DELICA LANCETS 57W MISC 1 Package by Does not apply route 2 (two) times daily. 100 each 0  . Vitamin D, Ergocalciferol, (DRISDOL) 50000 units CAPS capsule Take 1 capsule (50,000 Units total) by mouth every 7 (seven) days. 4 capsule 0  . zolpidem (AMBIEN) 5 MG tablet TAKE 1 TABLET BY MOUTH EVERY DAY AT BEDTIME AS NEEDED FOR SLEEP 30 tablet 3  . topiramate (TOPAMAX) 100 MG tablet Take 1 tablet (100 mg total) by mouth 2 (two)  times daily. Take 17m in am and 2027mat night 90 tablet 3   No current facility-administered medications on file prior to visit.     PAST MEDICAL HISTORY: Past Medical History:  Diagnosis Date  . CVA (cerebral vascular accident) ([facility]  . Depression   . DM type 2 (diabetes mellitus, type 2) ([facility]  . HLD (hyperlipidemia)   . HTN (hypertension)   . Joint pain   . Migraines   . Neck pain   . Polycystic disease, ovaries   . Right sided weakness   . Seizures ([facility]  . Shoulder pain   . Sickle cell trait ([facility]  . Thyroid nodule     PAST SURGICAL HISTORY: Past Surgical History:  Procedure Laterality Date  .  LAPAROSCOPIC CHOLECYSTECTOMY      SOCIAL HISTORY: Social History   Tobacco Use  . Smoking status: Never Smoker  . Smokeless tobacco: Never Used  Substance Use Topics  . Alcohol use: No  . Drug use: No    FAMILY HISTORY: Family History  Problem Relation Age of Onset  . Heart attack Mother   . Diabetes Mother   . Hypertension Mother   . Hyperlipidemia Mother   . Post-traumatic stress disorder Father        committed suicide  . Diabetes Father   . Hypertension Sister   . Thyroid cancer Paternal Grandmother     ROS: Review of Systems  Constitutional: Negative for weight loss.  Cardiovascular: Negative for chest pain and claudication.  Musculoskeletal: Negative for myalgias.  Endo/Heme/Allergies:       Negative hypoglycemia    PHYSICAL EXAM: Blood pressure (!) 165/94, pulse 98, temperature 98.5 F (36.9 C), temperature source Oral, height 5' 2"  (1.575 m), weight 215 lb (97.5 kg), last menstrual period 09/07/2018, SpO2 98 %. Body mass index is 39.32 kg/m. Physical Exam  Constitutional: She is oriented to person, place, and time. She appears well-developed and well-nourished.  Cardiovascular: Normal rate.  Pulmonary/Chest: Effort normal.  Musculoskeletal: Normal range of motion.  Neurological: She is oriented to person, place, and time.  Skin: Skin is warm and dry.  Psychiatric: She has a normal mood and affect. Her behavior is normal.  Vitals reviewed.   RECENT LABS AND TESTS: BMET    Component Value Date/Time   NA 142 09/11/2018 1126   K 3.9 09/11/2018 1126   CL 100 09/11/2018 1126   CO2 27 09/11/2018 1126   GLUCOSE 90 09/11/2018 1126   GLUCOSE 101 (H) 09/12/2017 1121   BUN 15 09/11/2018 1126   CREATININE 1.03 (H) 09/11/2018 1126   CREATININE 0.92 09/12/2017 1121   CALCIUM 9.1 09/11/2018 1126   GFRNONAA 66 09/11/2018 1126   GFRNONAA 71 03/17/2017 1513   GFRAA 76 09/11/2018 1126   GFRAA 82 03/17/2017 1513   Lab Results  Component Value Date   HGBA1C  5.6 10/01/2018   HGBA1C 5.6 08/10/2018   HGBA1C 7.4 (H) 05/28/2018   HGBA1C 6.2 02/06/2018   HGBA1C 5.8 07/25/2017   Lab Results  Component Value Date   INSULIN 13.7 09/11/2018   INSULIN 27.1 (H) 05/28/2018   CBC    Component Value Date/Time   WBC 9.3 05/28/2018 0918   WBC 5.3 09/12/2017 1121   RBC 5.50 (H) 05/28/2018 0918   RBC 4.90 09/12/2017 1121   HGB 13.5 05/28/2018 0918   HCT 41.5 05/28/2018 0918   PLT 308 09/12/2017 1121   MCV 76 (L) 05/28/2018 0918   MCH 24.5 (L)  05/28/2018 0918   MCH 25.1 (L) 09/12/2017 1121   MCHC 32.5 05/28/2018 0918   MCHC 32.6 09/12/2017 1121   RDW 18.2 (H) 05/28/2018 0918   LYMPHSABS 2.9 05/28/2018 0918   MONOABS 396 03/17/2017 1513   EOSABS 0.2 05/28/2018 0918   BASOSABS 0.0 05/28/2018 0918   Iron/TIBC/Ferritin/ %Sat    Component Value Date/Time   IRON 59 03/01/2017 2105   TIBC 291 03/01/2017 2105   FERRITIN 47 03/01/2017 2105   IRONPCTSAT 20 03/01/2017 2105   Lipid Panel     Component Value Date/Time   CHOL 163 09/11/2018 1126   TRIG 90 09/11/2018 1126   HDL 40 09/11/2018 1126   CHOLHDL 2.7 07/25/2017 1130   VLDL 27 02/26/2017 0341   LDLCALC 105 (H) 09/11/2018 1126   LDLCALC 70 07/25/2017 1130   Hepatic Function Panel     Component Value Date/Time   PROT 6.5 09/11/2018 1126   ALBUMIN 3.9 09/11/2018 1126   AST 9 09/11/2018 1126   ALT 11 09/11/2018 1126   ALKPHOS 98 09/11/2018 1126   BILITOT 0.3 09/11/2018 1126      Component Value Date/Time   TSH 1.060 05/28/2018 0918   TSH 0.76 11/30/2017 0954   TSH 0.99 05/24/2017 1629    ASSESSMENT AND PLAN: Type 2 diabetes mellitus without complication, without long-term current use of insulin ([place]) - Plan: metFORMIN (GLUCOPHAGE) 500 MG tablet, liraglutide (VICTOZA) 18 MG/3ML SOPN  Other hyperlipidemia  Class 2 severe obesity with serious comorbidity and body mass index (BMI) of 39.0 to 39.9 in adult, unspecified obesity type ([place])  PLAN:  Diabetes II Sharon Chan has been  given extensive diabetes education by myself today including ideal fasting and post-prandial blood glucose readings, individual ideal Hgb A1c goals and hypoglycemia prevention. We discussed the importance of good blood sugar control to decrease the likelihood of diabetic complications such as nephropathy, neuropathy, limb loss, blindness, coronary artery disease, and death. We discussed the importance of intensive lifestyle modification including diet, exercise and weight loss as the first line treatment for diabetes. Sharon Chan agrees to continue Victoza and she will increase dose to 1.2 mg daily #2 pens with no refills, and she agrees to continue taking metformin 500 mg BID #60 and we will refill for 1 month. Sharon Chan agrees to follow up with our clinic in 2 weeks.  Hyperlipidemia Sharon Chan was informed of the American Heart Association Guidelines emphasizing intensive lifestyle modifications as the first line treatment for hyperlipidemia. We discussed many lifestyle modifications today in depth, and Sharon Chan will continue to work on decreasing saturated fats such as fatty red meat, butter and many fried foods. She will also increase vegetables and lean protein in her diet and continue to work on diet, exercise, and weight loss efforts. Sharon Chan agrees to follow up with our clinic in 2 weeks.  Obesity Sharon Chan is currently in the action stage of change. As such, her goal is to continue with weight loss efforts She has agreed to follow the Category 1 plan Sharon Chan has been instructed to work up to a goal of 150 minutes of combined cardio and strengthening exercise per week for weight loss and overall health benefits. We discussed the following Behavioral Modification Strategies today: work on meal planning and easy cooking plans and holiday eating strategies    Sharon Chan has agreed to follow up with our clinic in 2 weeks. She was informed of the importance of frequent follow up visits to maximize her success with  intensive lifestyle modifications for her multiple  health conditions.   OBESITY BEHAVIORAL INTERVENTION VISIT  Today's visit was # 10   Starting weight: 234 lbs Starting date: 05/28/18 Today's weight : 215 lbs  Today's date: 10/02/2018 Total lbs lost to date: 19 At least 15 minutes were spent on discussing the following behavioral intervention visit.   ASK: We discussed the diagnosis of obesity with Sharon Chan today and Sharon Chan agreed to give [name] permission to discuss obesity behavioral modification therapy today.  ASSESS: Sharon Chan has the diagnosis of obesity and her BMI today is 39.31 Sharon Chan is in the action stage of change   ADVISE: Sharon Chan was educated on the multiple health risks of obesity as well as the benefit of weight loss to improve her health. She was advised of the need for long term treatment and the importance of lifestyle modifications.  AGREE: Multiple dietary modification options and treatment options were discussed and  Sharon Chan agreed to the above obesity treatment plan.  Wilhemena Durie, am acting as transcriptionist for Abby Potash, PA-C I, Abby Potash, PA-C have reviewed above note and agree with its content

## 2018-10-07 ENCOUNTER — Other Ambulatory Visit: Payer: Self-pay | Admitting: Family Medicine

## 2018-10-10 ENCOUNTER — Telehealth: Payer: Self-pay

## 2018-10-10 NOTE — Telephone Encounter (Signed)
-----   Message from Kallie LocksNatalie M Stroud, FNP sent at 10/10/2018  2:10 PM EST ----- Regarding: "Dose Adjustment" Please inform patient to decrease Metformin to 500 mg once daily. Hgb A1c continues to be stable. She is to keep follow up appointment. Thanks.

## 2018-10-10 NOTE — Telephone Encounter (Signed)
Patient notified

## 2018-10-22 ENCOUNTER — Encounter (INDEPENDENT_AMBULATORY_CARE_PROVIDER_SITE_OTHER): Payer: Self-pay | Admitting: Physician Assistant

## 2018-10-22 ENCOUNTER — Ambulatory Visit (INDEPENDENT_AMBULATORY_CARE_PROVIDER_SITE_OTHER): Payer: Medicare Other | Admitting: Physician Assistant

## 2018-10-22 VITALS — BP 168/28 | HR 90 | Temp 98.2°F | Ht 62.0 in | Wt 211.0 lb

## 2018-10-22 DIAGNOSIS — Z6838 Body mass index (BMI) 38.0-38.9, adult: Secondary | ICD-10-CM

## 2018-10-22 DIAGNOSIS — E119 Type 2 diabetes mellitus without complications: Secondary | ICD-10-CM | POA: Diagnosis not present

## 2018-10-22 DIAGNOSIS — E559 Vitamin D deficiency, unspecified: Secondary | ICD-10-CM | POA: Diagnosis not present

## 2018-10-22 MED ORDER — VITAMIN D (ERGOCALCIFEROL) 1.25 MG (50000 UNIT) PO CAPS: 50000.0000 [IU] | ORAL_CAPSULE | ORAL | 0 refills | Status: DC

## 2018-10-22 MED ORDER — LIRAGLUTIDE 18 MG/3ML ~~LOC~~ SOPN: 1.2000 mg | PEN_INJECTOR | SUBCUTANEOUS | 0 refills | Status: DC

## 2018-10-23 ENCOUNTER — Other Ambulatory Visit: Payer: Self-pay | Admitting: Internal Medicine

## 2018-10-23 ENCOUNTER — Other Ambulatory Visit (INDEPENDENT_AMBULATORY_CARE_PROVIDER_SITE_OTHER): Payer: Self-pay | Admitting: Family Medicine

## 2018-10-23 DIAGNOSIS — E119 Type 2 diabetes mellitus without complications: Secondary | ICD-10-CM

## 2018-10-23 NOTE — Progress Notes (Signed)
Office: [phone]  /  Fax: [phone]   HPI:   Chief Complaint: OBESITY Sharon Chan is here to discuss her progress with her obesity treatment plan. She is on the  follow the Category 1 plan and is following her eating plan approximately 95 % of the time. She states she is exercising by walking for 30 minutes 6 times per week. Sharon Chan did very well with weight loss. She reports that she works daily to get the food in on the plan. She is traveling to [place] for Christmas and New Year's.  Her weight is 211 lb (95.7 kg) today and has had a weight loss of 4 pounds over a period of 3 weeks since her last visit. She has lost 23 lbs since starting treatment with [name].  Diabetes II Pete has a diagnosis of diabetes type II. Sharon Chan  admits to two hypoglycemic episodes, with BG level in the 60's. She denies polyphagia.  Last A1c was 5.6. She has been working on intensive lifestyle modifications including diet, exercise, and weight loss to help control her blood glucose levels.  Vitamin D deficiency Sharon Chan has a diagnosis of vitamin D deficiency. She is currently taking vit D and denies nausea, vomiting or muscle weakness.  Ref. Range 09/11/2018 11:26  Vitamin D, 25-Hydroxy Latest Ref Range: 30.0 - 100.0 ng/mL 32.7   ALLERGIES: Allergies  Allergen Reactions  . Penicillins Anaphylaxis    MEDICATIONS: Current Outpatient Medications on File Prior to Visit  Medication Sig Dispense Refill  . acetaminophen (TYLENOL) 325 MG tablet Take 1-2 tablets (325-650 mg total) by mouth every 4 (four) hours as needed for mild pain.    Marland Kitchen albuterol (PROVENTIL HFA;VENTOLIN HFA) 108 (90 Base) MCG/ACT inhaler Inhale 2 puffs into the lungs every 6 (six) hours as needed for wheezing or shortness of breath. 1 Inhaler 11  . atorvastatin (LIPITOR) 40 MG tablet Take 1 tablet (40 mg total) by mouth daily at 6 PM. 30 tablet 6  . BD PEN NEEDLE NANO U/F 32G X 4 MM MISC 1 PACKAGE BY DOES NOT APPLY ROUTE 2 (TWO) TIMES DAILY.  100 each 0  . blood glucose meter kit and supplies KIT Dispense based on patient and insurance preference. Use up to four times daily as directed. (FOR ICD-9 250.00, 250.01). 1 each 0  . buPROPion (WELLBUTRIN XL) 150 MG 24 hr tablet TAKE 1 TABLET BY MOUTH EVERY DAY 30 tablet 0  . cyclobenzaprine (FLEXERIL) 5 MG tablet TAKE 1 TABLET 3 TIMES A DAY AS NEEDED FOR MUSCLE SPASM SHOULDER/NECK PAIN 90 tablet 6  . diphenhydrAMINE (BENADRYL ALLERGY) 25 MG tablet Take 25 mg by mouth every 6 (six) hours as needed.    . DULoxetine (CYMBALTA) 30 MG capsule TAKE 1 CAPSULE BY MOUTH EVERYDAY AT BEDTIME 30 capsule 4  . gabapentin (NEURONTIN) 300 MG capsule TAKE 1 TABLET IN THE MORNING AND 2 TABLETS AT BEDTIME DAILY 90 capsule 3  . glucose blood (ONE TOUCH ULTRA TEST) test strip TEST TWICE DAILY 100 each 0  . losartan (COZAAR) 25 MG tablet TAKE 1 TABLET BY MOUTH EVERY DAY 30 tablet 0  . metFORMIN (GLUCOPHAGE) 500 MG tablet Take 1 tablet (500 mg total) by mouth 2 (two) times daily with a meal. 180 tablet 3  . ONETOUCH DELICA LANCETS 75T MISC 1 Package by Does not apply route 2 (two) times daily. 100 each 0  . zolpidem (AMBIEN) 5 MG tablet TAKE 1 TABLET BY MOUTH EVERY DAY AT BEDTIME AS NEEDED FOR SLEEP 30 tablet  3  . topiramate (TOPAMAX) 100 MG tablet Take 1 tablet (100 mg total) by mouth 2 (two) times daily. Take 130m in am and 2032mat night 90 tablet 3   No current facility-administered medications on file prior to visit.     PAST MEDICAL HISTORY: Past Medical History:  Diagnosis Date  . CVA (cerebral vascular accident) ([facility]  . Depression   . DM type 2 (diabetes mellitus, type 2) ([facility]  . HLD (hyperlipidemia)   . HTN (hypertension)   . Joint pain   . Migraines   . Neck pain   . Polycystic disease, ovaries   . Right sided weakness   . Seizures ([facility]  . Shoulder pain   . Sickle cell trait ([facility]  . Thyroid nodule     PAST SURGICAL HISTORY: Past Surgical History:  Procedure Laterality Date  .  LAPAROSCOPIC CHOLECYSTECTOMY      SOCIAL HISTORY: Social History   Tobacco Use  . Smoking status: Never Smoker  . Smokeless tobacco: Never Used  Substance Use Topics  . Alcohol use: No  . Drug use: No    FAMILY HISTORY: Family History  Problem Relation Age of Onset  . Heart attack Mother   . Diabetes Mother   . Hypertension Mother   . Hyperlipidemia Mother   . Post-traumatic stress disorder Father        committed suicide  . Diabetes Father   . Hypertension Sister   . Thyroid cancer Paternal Grandmother     ROS: Review of Systems  Constitutional: Positive for weight loss.  Gastrointestinal: Negative for nausea and vomiting.  Musculoskeletal:       Negative for muscle weakness  Endo/Heme/Allergies:       Negative for polyphagia  Positive for hypoglycemia    PHYSICAL EXAM: Blood pressure (!) 168/28, pulse 90, temperature 98.2 F (36.8 C), temperature source Oral, height _0  (1.575 m), weight 211 lb (95.7 kg), SpO2 99 %. Body mass index is 38.59 kg/m. Physical Exam  Constitutional: She is oriented to person, place, and time. She appears well-developed and well-nourished.  HENT:  Head: Normocephalic.  Eyes: Pupils are equal, round, and reactive to light.  Neck: Normal range of motion.  Cardiovascular: Normal rate.  Pulmonary/Chest: Effort normal.  Musculoskeletal: Normal range of motion.  Neurological: She is alert and oriented to person, place, and time.  Skin: Skin is warm and dry.  Psychiatric: She has a normal mood and affect. Her behavior is normal.  Vitals reviewed.   RECENT LABS AND TESTS: BMET    Component Value Date/Time   NA 142 09/11/2018 1126   K 3.9 09/11/2018 1126   CL 100 09/11/2018 1126   CO2 27 09/11/2018 1126   GLUCOSE 90 09/11/2018 1126   GLUCOSE 101 (H) 09/12/2017 1121   BUN 15 09/11/2018 1126   CREATININE 1.03 (H) 09/11/2018 1126   CREATININE 0.92 09/12/2017 1121   CALCIUM 9.1 09/11/2018 1126   GFRNONAA 66 09/11/2018 1126    GFRNONAA 71 03/17/2017 1513   GFRAA 76 09/11/2018 1126   GFRAA 82 03/17/2017 1513   Lab Results  Component Value Date   HGBA1C 5.6 10/01/2018   HGBA1C 5.6 08/10/2018   HGBA1C 7.4 (H) 05/28/2018   HGBA1C 6.2 02/06/2018   HGBA1C 5.8 07/25/2017   Lab Results  Component Value Date   INSULIN 13.7 09/11/2018   INSULIN 27.1 (H) 05/28/2018   CBC    Component Value Date/Time   WBC 9.3 05/28/2018 0918  WBC 5.3 09/12/2017 1121   RBC 5.50 (H) 05/28/2018 0918   RBC 4.90 09/12/2017 1121   HGB 13.5 05/28/2018 0918   HCT 41.5 05/28/2018 0918   PLT 308 09/12/2017 1121   MCV 76 (L) 05/28/2018 0918   MCH 24.5 (L) 05/28/2018 0918   MCH 25.1 (L) 09/12/2017 1121   MCHC 32.5 05/28/2018 0918   MCHC 32.6 09/12/2017 1121   RDW 18.2 (H) 05/28/2018 0918   LYMPHSABS 2.9 05/28/2018 0918   MONOABS 396 03/17/2017 1513   EOSABS 0.2 05/28/2018 0918   BASOSABS 0.0 05/28/2018 0918   Iron/TIBC/Ferritin/ %Sat    Component Value Date/Time   IRON 59 03/01/2017 2105   TIBC 291 03/01/2017 2105   FERRITIN 47 03/01/2017 2105   IRONPCTSAT 20 03/01/2017 2105   Lipid Panel     Component Value Date/Time   CHOL 163 09/11/2018 1126   TRIG 90 09/11/2018 1126   HDL 40 09/11/2018 1126   CHOLHDL 2.7 07/25/2017 1130   VLDL 27 02/26/2017 0341   LDLCALC 105 (H) 09/11/2018 1126   LDLCALC 70 07/25/2017 1130   Hepatic Function Panel     Component Value Date/Time   PROT 6.5 09/11/2018 1126   ALBUMIN 3.9 09/11/2018 1126   AST 9 09/11/2018 1126   ALT 11 09/11/2018 1126   ALKPHOS 98 09/11/2018 1126   BILITOT 0.3 09/11/2018 1126      Component Value Date/Time   TSH 1.060 05/28/2018 0918   TSH 0.76 11/30/2017 0954   TSH 0.99 05/24/2017 1629    Ref. Range 09/11/2018 11:26  Vitamin D, 25-Hydroxy Latest Ref Range: 30.0 - 100.0 ng/mL 32.7    ASSESSMENT AND PLAN: Type 2 diabetes mellitus without complication, without long-term current use of insulin (HCC) - Plan: liraglutide (VICTOZA) 18 MG/3ML  SOPN  Vitamin D deficiency - Plan: Vitamin D, Ergocalciferol, (DRISDOL) 1.25 MG (50000 UT) CAPS capsule  Class 2 severe obesity with serious comorbidity and body mass index (BMI) of 38.0 to 38.9 in adult, unspecified obesity type ([place])  PLAN: Diabetes II Sharon Chan has been given extensive diabetes education by myself today including ideal fasting and post-prandial blood glucose readings, individual ideal HgA1c goals  and hypoglycemia prevention. We discussed the importance of good blood sugar control to decrease the likelihood of diabetic complications such as nephropathy, neuropathy, limb loss, blindness, coronary artery disease, and death. We discussed the importance of intensive lifestyle modification including diet, exercise and weight loss as the first line treatment for diabetes. Jakyla agrees to continue her diabetes medications, refill given today for Victoza 1.2 mg subq daily #2 pens with no refills and will follow up at the agreed upon time.  Vitamin D Deficiency Sharon Chan was informed that low vitamin D levels contributes to fatigue and are associated with obesity, breast, and colon cancer. She agrees to continue to take prescription Vit D _0 ,000 IU every week #4 with no refills  and will follow up for routine testing of vitamin D, at least 2-3 times per year. She was informed of the risk of over-replacement of vitamin D and agrees to not increase her dose unless she discusses this with [name] first. Agrees to follow up with our clinic as directed.   Obesity Sharon Chan is currently in the action stage of change. As such, her goal is to continue with weight loss efforts She has agreed to follow the Category 1 plan Sharon Chan has been instructed to work up to a goal of 150 minutes of combined cardio and strengthening exercise per week  for weight loss and overall health benefits. We discussed the following Behavioral Modification Strategies today: work on meal planning and easy cooking plans and travel  eating strategies.    Sharon Chan has agreed to follow up with our clinic in 4 weeks. She was informed of the importance of frequent follow up visits to maximize her success with intensive lifestyle modifications for her multiple health conditions.   OBESITY BEHAVIORAL INTERVENTION VISIT  Today's visit was # 11   Starting weight: 234 lb Starting date: 05/28/18 Today's weight : Weight: 211 lb (95.7 kg)  Today's date: 10/22/18 Total lbs lost to date: 23 lb At least 15 minutes were spent on discussing the following behavioral intervention visit.   ASK: We discussed the diagnosis of obesity with Sharon Chan today and Sharon Chan agreed to give [name] permission to discuss obesity behavioral modification therapy today.  ASSESS: Sharon Chan has the diagnosis of obesity and her BMI today is 38.58 Sharon Chan is in the action stage of change   ADVISE: Sharon Chan was educated on the multiple health risks of obesity as well as the benefit of weight loss to improve her health. She was advised of the need for long term treatment and the importance of lifestyle modifications to improve her current health and to decrease her risk of future health problems.  AGREE: Multiple dietary modification options and treatment options were discussed and  Sharon Chan agreed to follow the recommendations documented in the above note.  ARRANGE: Sharon Chan was educated on the importance of frequent visits to treat obesity as outlined per CMS and USPSTF guidelines and agreed to schedule her next follow up appointment today.  Leary Roca, am acting as transcriptionist for Abby Potash, PA-C I, Abby Potash, PA-C have reviewed above note and agree with its content

## 2018-11-19 ENCOUNTER — Encounter (INDEPENDENT_AMBULATORY_CARE_PROVIDER_SITE_OTHER): Payer: Self-pay | Admitting: Physician Assistant

## 2018-11-19 ENCOUNTER — Other Ambulatory Visit: Payer: Self-pay

## 2018-11-19 ENCOUNTER — Ambulatory Visit (INDEPENDENT_AMBULATORY_CARE_PROVIDER_SITE_OTHER): Payer: Medicare Other | Admitting: Physician Assistant

## 2018-11-19 VITALS — BP 128/81 | HR 104 | Temp 97.9°F | Ht 62.0 in | Wt 211.0 lb

## 2018-11-19 DIAGNOSIS — E119 Type 2 diabetes mellitus without complications: Secondary | ICD-10-CM | POA: Diagnosis not present

## 2018-11-19 DIAGNOSIS — Z6838 Body mass index (BMI) 38.0-38.9, adult: Secondary | ICD-10-CM | POA: Diagnosis not present

## 2018-11-19 DIAGNOSIS — I10 Essential (primary) hypertension: Secondary | ICD-10-CM

## 2018-11-19 DIAGNOSIS — E559 Vitamin D deficiency, unspecified: Secondary | ICD-10-CM

## 2018-11-19 MED ORDER — BUPROPION HCL ER (XL) 150 MG PO TB24: 150.0000 mg | ORAL_TABLET | Freq: Every day | ORAL | 0 refills | Status: DC

## 2018-11-19 MED ORDER — LOSARTAN POTASSIUM 25 MG PO TABS: 25.0000 mg | ORAL_TABLET | Freq: Every day | ORAL | 0 refills | Status: DC

## 2018-11-19 MED ORDER — VITAMIN D (ERGOCALCIFEROL) 1.25 MG (50000 UNIT) PO CAPS: 50000.0000 [IU] | ORAL_CAPSULE | ORAL | 0 refills | Status: DC

## 2018-11-19 MED ORDER — LIRAGLUTIDE 18 MG/3ML ~~LOC~~ SOPN: 1.2000 mg | PEN_INJECTOR | SUBCUTANEOUS | 0 refills | Status: DC

## 2018-11-19 NOTE — Progress Notes (Signed)
Office: [phone]  /  Fax: [phone]   HPI:   Chief Complaint: OBESITY Sharon Chan is here to discuss her progress with her obesity treatment plan. She is on the Category 1 plan and is following her eating plan approximately 100 % of the time. She states she is walking and doing yoga 60 minutes 6 times per week. Sharon Chan did very well with maintaining her weight while in [place] over the holidays. She is ready to continue with the plan. Her weight is 211 lb (95.7 kg) today and she has maintained weight over a period of 4 weeks since her last visit. She has lost 23 lbs since starting treatment with [name].  Diabetes II Sharon Chan has a diagnosis of diabetes type II. Sharon Chan states fasting BGs range between 68 and 102 and she denies polyphagia. Last A1c was at 5.6 She has been working on intensive lifestyle modifications including diet, exercise, and weight loss to help control her blood glucose levels.  Vitamin D deficiency Sharon Chan has a diagnosis of vitamin D deficiency. She is currently taking vit D and denies nausea, vomiting or muscle weakness.  Hypertension Sharon Chan is a 43 y.o. female with hypertension.  Sharon Chan denies chest pain. She is working weight loss to help control her blood pressure with the goal of decreasing her risk of heart attack and stroke. Sharon Chan blood pressure is currently controlled.  ASSESSMENT AND PLAN:  Type 2 diabetes mellitus without complication, without long-term current use of insulin ([place]) - Plan: liraglutide (VICTOZA) 18 MG/3ML SOPN  Vitamin D deficiency - Plan: Vitamin D, Ergocalciferol, (DRISDOL) 1.25 MG (50000 UT) CAPS capsule  Essential hypertension - Plan: losartan (COZAAR) 25 MG tablet  Class 2 severe obesity with serious comorbidity and body mass index (BMI) of 38.0 to 38.9 in adult, unspecified obesity type ([place])  PLAN:  Diabetes II Sharon Chan has been given extensive diabetes education by myself today including ideal  fasting and post-prandial blood glucose readings, individual ideal Hgb A1c goals and hypoglycemia prevention. We discussed the importance of good blood sugar control to decrease the likelihood of diabetic complications such as nephropathy, neuropathy, limb loss, blindness, coronary artery disease, and death. We discussed the importance of intensive lifestyle modification including diet, exercise and weight loss as the first line treatment for diabetes. [name] agrees to continue victoza #2 pens with no refills and follow up at the agreed upon time.  Vitamin D Deficiency Sharon Chan was informed that low vitamin D levels contributes to fatigue and are associated with obesity, breast, and colon cancer. She agrees to continue to take prescription Vit D _0 ,000 IU every week #4 with no refills and will follow up for routine testing of vitamin D, at least 2-3 times per year. She was informed of the risk of over-replacement of vitamin D and agrees to not increase her dose unless she discusses this with [name] first. Sharon Chan agrees to follow up as directed.  Hypertension We discussed sodium restriction, working on healthy weight loss, and a regular exercise program as the means to achieve improved blood pressure control. Sharon Chan agreed with this plan and agreed to follow up as directed. We will continue to monitor her blood pressure as well as her progress with the above lifestyle modifications. She agreed to continue Losartan 25 mg qd #30 with no refills and will watch for signs of hypotension as she continues her lifestyle modifications.  Obesity Sharon Chan is currently in the action stage of change. As such, her goal is to continue with  weight loss efforts She has agreed to follow the Category 1 plan Sharon Chan has been instructed to work up to a goal of 150 minutes of combined cardio and strengthening exercise per week for weight loss and overall health benefits. We discussed the following Behavioral Modification  Strategies today: planning for success and work on meal planning and easy cooking plans  Sharon Chan has agreed to follow up with our clinic in 2 weeks. She was informed of the importance of frequent follow up visits to maximize her success with intensive lifestyle modifications for her multiple health conditions.  ALLERGIES: Allergies  Allergen Reactions  . Penicillins Anaphylaxis    MEDICATIONS: Current Outpatient Medications on File Prior to Visit  Medication Sig Dispense Refill  . acetaminophen (TYLENOL) 325 MG tablet Take 1-2 tablets (325-650 mg total) by mouth every 4 (four) hours as needed for mild pain.    Marland Kitchen albuterol (PROVENTIL HFA;VENTOLIN HFA) 108 (90 Base) MCG/ACT inhaler Inhale 2 puffs into the lungs every 6 (six) hours as needed for wheezing or shortness of breath. 1 Inhaler 11  . atorvastatin (LIPITOR) 40 MG tablet Take 1 tablet (40 mg total) by mouth daily at 6 PM. 30 tablet 6  . BD PEN NEEDLE NANO U/F 32G X 4 MM MISC 1 PACKAGE BY DOES NOT APPLY ROUTE 2 (TWO) TIMES DAILY. 100 each 0  . blood glucose meter kit and supplies KIT Dispense based on patient and insurance preference. Use up to four times daily as directed. (FOR ICD-9 250.00, 250.01). 1 each 0  . cyclobenzaprine (FLEXERIL) 5 MG tablet TAKE 1 TABLET 3 TIMES A DAY AS NEEDED FOR MUSCLE SPASM SHOULDER/NECK PAIN 90 tablet 6  . diphenhydrAMINE (BENADRYL ALLERGY) 25 MG tablet Take 25 mg by mouth every 6 (six) hours as needed.    . DULoxetine (CYMBALTA) 30 MG capsule TAKE 1 CAPSULE BY MOUTH EVERYDAY AT BEDTIME 30 capsule 4  . gabapentin (NEURONTIN) 300 MG capsule TAKE 1 TABLET IN THE MORNING AND 2 TABLETS AT BEDTIME DAILY 90 capsule 3  . glucose blood (ONE TOUCH ULTRA TEST) test strip TEST TWICE A DAY 100 each 0  . metFORMIN (GLUCOPHAGE) 500 MG tablet Take 1 tablet (500 mg total) by mouth 2 (two) times daily with a meal. 180 tablet 3  . ONETOUCH DELICA LANCETS 67J MISC TES 2 (TWO) TIMES DAILY. 100 each 0  . zolpidem (AMBIEN) 5  MG tablet TAKE 1 TABLET BY MOUTH EVERY DAY AT BEDTIME AS NEEDED FOR SLEEP 30 tablet 3  . topiramate (TOPAMAX) 100 MG tablet Take 1 tablet (100 mg total) by mouth 2 (two) times daily. Take 192m in am and 2045mat night 90 tablet 3   No current facility-administered medications on file prior to visit.     PAST MEDICAL HISTORY: Past Medical History:  Diagnosis Date  . CVA (cerebral vascular accident) ([facility]  . Depression   . DM type 2 (diabetes mellitus, type 2) ([facility]  . HLD (hyperlipidemia)   . HTN (hypertension)   . Joint pain   . Migraines   . Neck pain   . Polycystic disease, ovaries   . Right sided weakness   . Seizures ([facility]  . Shoulder pain   . Sickle cell trait ([facility]  . Thyroid nodule     PAST SURGICAL HISTORY: Past Surgical History:  Procedure Laterality Date  . LAPAROSCOPIC CHOLECYSTECTOMY      SOCIAL HISTORY: Social History   Tobacco Use  . Smoking status: Never Smoker  .  Smokeless tobacco: Never Used  Substance Use Topics  . Alcohol use: No  . Drug use: No    FAMILY HISTORY: Family History  Problem Relation Age of Onset  . Heart attack Mother   . Diabetes Mother   . Hypertension Mother   . Hyperlipidemia Mother   . Post-traumatic stress disorder Father        committed suicide  . Diabetes Father   . Hypertension Sister   . Thyroid cancer Paternal Grandmother     ROS: Review of Systems  Constitutional: Negative for weight loss.  Gastrointestinal: Negative for nausea and vomiting.  Musculoskeletal:       Negative for muscle weakness  Endo/Heme/Allergies:       Negative for polyphagia    PHYSICAL EXAM: Blood pressure 128/81, pulse (!) 104, temperature 97.9 F (36.6 C), temperature source Oral, height _0  (1.575 m), weight 211 lb (95.7 kg), SpO2 97 %. Body mass index is 38.59 kg/m. Physical Exam Vitals signs reviewed.  Constitutional:      Appearance: Normal appearance. She is well-developed. She is obese.  Cardiovascular:      Rate and Rhythm: Normal rate.  Pulmonary:     Effort: Pulmonary effort is normal.  Musculoskeletal: Normal range of motion.  Skin:    General: Skin is warm and dry.  Neurological:     Mental Status: She is alert and oriented to person, place, and time.  Psychiatric:        Mood and Affect: Mood normal.        Behavior: Behavior normal.     RECENT LABS AND TESTS: BMET    Component Value Date/Time   NA 142 09/11/2018 1126   K 3.9 09/11/2018 1126   CL 100 09/11/2018 1126   CO2 27 09/11/2018 1126   GLUCOSE 90 09/11/2018 1126   GLUCOSE 101 (H) 09/12/2017 1121   BUN 15 09/11/2018 1126   CREATININE 1.03 (H) 09/11/2018 1126   CREATININE 0.92 09/12/2017 1121   CALCIUM 9.1 09/11/2018 1126   GFRNONAA 66 09/11/2018 1126   GFRNONAA 71 03/17/2017 1513   GFRAA 76 09/11/2018 1126   GFRAA 82 03/17/2017 1513   Lab Results  Component Value Date   HGBA1C 5.6 10/01/2018   HGBA1C 5.6 08/10/2018   HGBA1C 7.4 (H) 05/28/2018   HGBA1C 6.2 02/06/2018   HGBA1C 5.8 07/25/2017   Lab Results  Component Value Date   INSULIN 13.7 09/11/2018   INSULIN 27.1 (H) 05/28/2018   CBC    Component Value Date/Time   WBC 9.3 05/28/2018 0918   WBC 5.3 09/12/2017 1121   RBC 5.50 (H) 05/28/2018 0918   RBC 4.90 09/12/2017 1121   HGB 13.5 05/28/2018 0918   HCT 41.5 05/28/2018 0918   PLT 308 09/12/2017 1121   MCV 76 (L) 05/28/2018 0918   MCH 24.5 (L) 05/28/2018 0918   MCH 25.1 (L) 09/12/2017 1121   MCHC 32.5 05/28/2018 0918   MCHC 32.6 09/12/2017 1121   RDW 18.2 (H) 05/28/2018 0918   LYMPHSABS 2.9 05/28/2018 0918   MONOABS 396 03/17/2017 1513   EOSABS 0.2 05/28/2018 0918   BASOSABS 0.0 05/28/2018 0918   Iron/TIBC/Ferritin/ %Sat    Component Value Date/Time   IRON 59 03/01/2017 2105   TIBC 291 03/01/2017 2105   FERRITIN 47 03/01/2017 2105   IRONPCTSAT 20 03/01/2017 2105   Lipid Panel     Component Value Date/Time   CHOL 163 09/11/2018 1126   TRIG 90 09/11/2018 1126   HDL 40 09/11/2018  1126   CHOLHDL 2.7 07/25/2017 1130   VLDL 27 02/26/2017 0341   LDLCALC 105 (H) 09/11/2018 1126   LDLCALC 70 07/25/2017 1130   Hepatic Function Panel     Component Value Date/Time   PROT 6.5 09/11/2018 1126   ALBUMIN 3.9 09/11/2018 1126   AST 9 09/11/2018 1126   ALT 11 09/11/2018 1126   ALKPHOS 98 09/11/2018 1126   BILITOT 0.3 09/11/2018 1126      Component Value Date/Time   TSH 1.060 05/28/2018 0918   TSH 0.76 11/30/2017 0954   TSH 0.99 05/24/2017 1629   Results for CAMRON, ESSMAN (MRN [id number]) as of 11/19/2018 16:50  Ref. Range 09/11/2018 11:26  Vitamin D, 25-Hydroxy Latest Ref Range: 30.0 - 100.0 ng/mL 32.7     OBESITY BEHAVIORAL INTERVENTION VISIT  Today's visit was # 11   Starting weight: 234 lbs Starting date: 05/28/2018 Today's weight : 211 lbs Today's date: 11/19/2018 Total lbs lost to date: 23 At least 15 minutes were spent on discussing the following behavioral intervention visit.   ASK: We discussed the diagnosis of obesity with Sharon Chan today and Nallely agreed to give [name] permission to discuss obesity behavioral modification therapy today.  ASSESS: Catrice has the diagnosis of obesity and her BMI today is 38.58 Sharon Chan is in the action stage of change   ADVISE: Juni was educated on the multiple health risks of obesity as well as the benefit of weight loss to improve her health. She was advised of the need for long term treatment and the importance of lifestyle modifications to improve her current health and to decrease her risk of future health problems.  AGREE: Multiple dietary modification options and treatment options were discussed and  Sharon Chan agreed to follow the recommendations documented in the above note.  ARRANGE: Sharon Chan was educated on the importance of frequent visits to treat obesity as outlined per CMS and USPSTF guidelines and agreed to schedule her next follow up appointment today.  Corey Skains, am acting as  transcriptionist for Abby Potash, PA-C I, Abby Potash, PA-C have reviewed above note and agree with its content

## 2018-11-28 ENCOUNTER — Other Ambulatory Visit: Payer: Self-pay | Admitting: Endocrinology

## 2018-11-28 DIAGNOSIS — E041 Nontoxic single thyroid nodule: Secondary | ICD-10-CM

## 2018-11-28 DIAGNOSIS — E119 Type 2 diabetes mellitus without complications: Secondary | ICD-10-CM

## 2018-12-03 ENCOUNTER — Other Ambulatory Visit (INDEPENDENT_AMBULATORY_CARE_PROVIDER_SITE_OTHER): Payer: Medicare Other

## 2018-12-03 DIAGNOSIS — E041 Nontoxic single thyroid nodule: Secondary | ICD-10-CM | POA: Diagnosis not present

## 2018-12-03 LAB — TSH: TSH: 0.57 u[IU]/mL (ref 0.35–4.50)

## 2018-12-03 LAB — T4, FREE: Free T4: 0.73 ng/dL (ref 0.60–1.60)

## 2018-12-04 NOTE — Progress Notes (Signed)
Patient ID: Sharon Chan, female   DOB: 04-Jul-1976, 43 y.o.   MRN: [id number]          Referring physician: Virginia Rochester FNP   Reason for Appointment: Follow-up of thyroid nodule    History of Present Illness:   The patient's thyroid nodule was first discovered incidentally when she was having a CT angiogram in April after her stroke  The patient had not had any previous knowledge of swelling in her thyroid or mass in the neck The thyroid ultrasound in 5/18 showed the following  There is a 2.2 cm left superior mixed cystic solid hypoechoic TR 3 nodule  RECENT history:  Patient had a follow-up ultrasound done to assess her thyroid nodule, in 10/2017 This showed the nodule to be 1.8 cm and no other new nodules were found  Although she had a slightly low TSH in 6/18 it has been consistently normal subsequently  No recent complaints of fatigue She does not have any local discomfort or pressure in the neck  Lab Results  Component Value Date   FREET4 0.73 12/03/2018   FREET4 1.08 05/28/2018   FREET4 0.74 11/30/2017   TSH 0.57 12/03/2018   TSH 1.060 05/28/2018   TSH 0.76 11/30/2017    Allergies as of 12/05/2018      Reactions   Penicillins Anaphylaxis      Medication List       Accurate as of December 05, 2018 11:07 AM. Always use your most recent med list.        acetaminophen 325 MG tablet Commonly known as:  TYLENOL Take 1-2 tablets (325-650 mg total) by mouth every 4 (four) hours as needed for mild pain.   albuterol 108 (90 Base) MCG/ACT inhaler Commonly known as:  PROVENTIL HFA;VENTOLIN HFA Inhale 2 puffs into the lungs every 6 (six) hours as needed for wheezing or shortness of breath.   atorvastatin 40 MG tablet Commonly known as:  LIPITOR Take 1 tablet (40 mg total) by mouth daily at 6 PM.   BD PEN NEEDLE NANO U/F 32G X 4 MM Misc Generic drug:  Insulin Pen Needle 1 PACKAGE BY DOES NOT APPLY ROUTE 2 (TWO) TIMES DAILY.   BENADRYL ALLERGY 25 MG  tablet Generic drug:  diphenhydrAMINE Take 25 mg by mouth every 6 (six) hours as needed.   blood glucose meter kit and supplies Kit Dispense based on patient and insurance preference. Use up to four times daily as directed. (FOR ICD-9 250.00, 250.01).   buPROPion 150 MG 24 hr tablet Commonly known as:  WELLBUTRIN XL Take 1 tablet (150 mg total) by mouth daily.   cyclobenzaprine 5 MG tablet Commonly known as:  FLEXERIL TAKE 1 TABLET 3 TIMES A DAY AS NEEDED FOR MUSCLE SPASM SHOULDER/NECK PAIN   DULoxetine 30 MG capsule Commonly known as:  CYMBALTA TAKE 1 CAPSULE BY MOUTH EVERYDAY AT BEDTIME   gabapentin 300 MG capsule Commonly known as:  NEURONTIN TAKE 1 TABLET IN THE MORNING AND 2 TABLETS AT BEDTIME DAILY   glucose blood test strip Commonly known as:  ONE TOUCH ULTRA TEST TEST TWICE A DAY   liraglutide 18 MG/3ML Sopn Commonly known as:  VICTOZA Inject 0.2 mLs (1.2 mg total) into the skin every morning.   losartan 25 MG tablet Commonly known as:  COZAAR Take 1 tablet (25 mg total) by mouth daily.   metFORMIN 500 MG tablet Commonly known as:  GLUCOPHAGE Take 1 tablet (500 mg total) by mouth 2 (two) times daily  with a meal.   ONETOUCH DELICA LANCETS 28J Misc TES 2 (TWO) TIMES DAILY.   topiramate 100 MG tablet Commonly known as:  TOPAMAX Take 1 tablet (100 mg total) by mouth 2 (two) times daily. Take 131m in am and 201mat night   Vitamin D (Ergocalciferol) 1.25 MG (50000 UT) Caps capsule Commonly known as:  DRISDOL Take 1 capsule (50,000 Units total) by mouth every 7 (seven) days.   zolpidem 5 MG tablet Commonly known as:  AMBIEN TAKE 1 TABLET BY MOUTH EVERY DAY AT BEDTIME AS NEEDED FOR SLEEP       Allergies:  Allergies  Allergen Reactions  . Penicillins Anaphylaxis    Past Medical History:  Diagnosis Date  . [facility] [place]cere[facility] vascular accident) ([facility]  . Depression   . DM type 2 (diabetes mellitus, type 2) ([facility]  . HLD (hyperlipidemia)   . HTN  (hypertension)   . Joint pain   . Migraines   . Neck pain   . Polycystic disease, ovaries   . Right sided weakness   . Seizures ([facility]  . Shoulder pain   . Sickle cell trait ([facility]  . Thyroid nodule       Past Surgical History:  Procedure Laterality Date  . LAPAROSCOPIC CHOLECYSTECTOMY      Family History  Problem Relation Age of Onset  . Heart attack Mother   . Diabetes Mother   . Hypertension Mother   . Hyperlipidemia Mother   . Post-traumatic stress disorder Father        committed suicide  . Diabetes Father   . Hypertension Sister   . Thyroid cancer Paternal Grandmother     Social History:  reports that she has never smoked. She has never used smokeless tobacco. She reports that she does not drink alcohol or use drugs.     Review of Systems  Blood pressure has been high, treated with 25 mg losartan by PCP  BP Readings from Last 3 Encounters:  12/05/18 140/88  11/19/18 128/81  10/22/18 (!) 168/28   She has been told to have PCOS by gynecologist, has history of infrequent menstrual cycles, facial hirsutism and prediabetes with baseline A1c 6.3 Has been prescribed metformin and Victoza at the weight loss clinic  Lab Results  Component Value Date   HGBA1C 5.6 10/01/2018   HGBA1C 5.6 08/10/2018   HGBA1C 7.4 (H) 05/28/2018   Lab Results  Component Value Date   LDLCALC 105 (H) 09/11/2018   CREATININE 1.03 (H) 09/11/2018     Examination:   BP 140/88 (BP Location: Right Arm, Patient Position: Sitting, Cuff Size: Normal)   Pulse (!) 102   Ht _0  (1.575 m)   Wt 215 lb 3.2 oz (97.6 kg)   SpO2 98%   BMI 39.36 kg/m            THYROID nodule is not clearly palpable, medial left lobe is slightly prominent on swallowing Right thyroid lobe is not palpable   There is no lymphadenopathy in the neck  Biceps reflexes normal  Assessment/Plan:  Thyroid nodule: She has a low risk nodule which is mixed cystic/solid  On follow-up it had been smaller in  2018  Currently the thyroid nodule is not palpable as before and likely smaller  Since she has a low risk nodule which is clinically not palpable now will not need any further evaluation or follow-up  She will continue follow-up for mild diabetes and obesity with her weight loss program  Elayne Snare 12/05/2018

## 2018-12-05 ENCOUNTER — Encounter (INDEPENDENT_AMBULATORY_CARE_PROVIDER_SITE_OTHER): Payer: Self-pay | Admitting: Physician Assistant

## 2018-12-05 ENCOUNTER — Ambulatory Visit (INDEPENDENT_AMBULATORY_CARE_PROVIDER_SITE_OTHER): Payer: Medicare Other | Admitting: Endocrinology

## 2018-12-05 ENCOUNTER — Ambulatory Visit (INDEPENDENT_AMBULATORY_CARE_PROVIDER_SITE_OTHER): Payer: Medicare Other | Admitting: Physician Assistant

## 2018-12-05 ENCOUNTER — Encounter: Payer: Self-pay | Admitting: Endocrinology

## 2018-12-05 VITALS — BP 162/113 | HR 91 | Temp 98.1°F | Ht 62.0 in | Wt 211.0 lb

## 2018-12-05 VITALS — BP 140/88 | HR 102 | Ht 62.0 in | Wt 215.2 lb

## 2018-12-05 DIAGNOSIS — E041 Nontoxic single thyroid nodule: Secondary | ICD-10-CM

## 2018-12-05 DIAGNOSIS — Z6838 Body mass index (BMI) 38.0-38.9, adult: Secondary | ICD-10-CM

## 2018-12-05 DIAGNOSIS — E119 Type 2 diabetes mellitus without complications: Secondary | ICD-10-CM

## 2018-12-05 DIAGNOSIS — E559 Vitamin D deficiency, unspecified: Secondary | ICD-10-CM | POA: Diagnosis not present

## 2018-12-05 MED ORDER — LIRAGLUTIDE 18 MG/3ML ~~LOC~~ SOPN: 1.2000 mg | PEN_INJECTOR | SUBCUTANEOUS | 0 refills | Status: DC

## 2018-12-05 MED ORDER — VITAMIN D (ERGOCALCIFEROL) 1.25 MG (50000 UNIT) PO CAPS: 50000.0000 [IU] | ORAL_CAPSULE | ORAL | 0 refills | Status: DC

## 2018-12-05 NOTE — Progress Notes (Signed)
Office: [phone]  /  Fax: [phone]   HPI:   Chief Complaint: OBESITY Suzann is here to discuss her progress with her obesity treatment plan. She is on the Category 1 plan and is following her eating plan approximately 100 % of the time. She states she is walking, aerobics and swimming 60-120 minutes 6 times per week. Brina reports that she has been following the plan exactly and brings in a written food log today. She is exercising almost every day. Her weight is 211 lb (95.7 kg) today and has lost 0 lbs since her last visit. She has lost 23 lbs since starting treatment with [name].  Diabetes II Abbigale has a diagnosis of diabetes type II. Lashun states fasting BGs range between 80 and 100 and denies any hypoglycemic episodes. Last A1c was Hemoglobin A1C Latest Ref Rng & Units 10/01/2018 08/10/2018 05/28/2018 02/06/2018  HGBA1C 4.0 - 5.6 % 5.6 5.6 7.4(H) 6.2  Some recent data might be hidden    She has been working on intensive lifestyle modifications including diet, exercise, and weight loss to help control her blood glucose levels. She is currently taking Victoza.  Vitamin D deficiency Brettney has a diagnosis of vitamin D deficiency. She is currently taking prescription Vit D and denies nausea, vomiting or muscle weakness.  ASSESSMENT AND PLAN:  Type 2 diabetes mellitus without complication, without long-term current use of insulin (HCC) - Plan: liraglutide (VICTOZA) 18 MG/3ML SOPN  Vitamin D deficiency - Plan: Vitamin D, Ergocalciferol, (DRISDOL) 1.25 MG (50000 UT) CAPS capsule  Class 2 severe obesity with serious comorbidity and body mass index (BMI) of 38.0 to 38.9 in adult, unspecified obesity type ([place])  PLAN:  Diabetes II Kristel has been given extensive diabetes education by myself today including ideal fasting and post-prandial blood glucose readings, individual ideal Hgb A1c goals and hypoglycemia prevention. We discussed the importance of good blood sugar control  to decrease the likelihood of diabetic complications such as nephropathy, neuropathy, limb loss, blindness, coronary artery disease, and death. We discussed the importance of intensive lifestyle modification including diet, exercise and weight loss as the first line treatment for diabetes. Edris agrees to continue taking Victoza pens # 2 with no refills and will follow up with our clinic in 2 weeks.  Vitamin D Deficiency Princessa was informed that low vitamin D levels contributes to fatigue and are associated with obesity, breast, and colon cancer. She agrees to continue to take prescription Vit D _0 ,000 IU every week #4 with no refills and will follow up for routine testing of vitamin D, at least 2-3 times per year. She was informed of the risk of over-replacement of vitamin D and agrees to not increase her dose unless she discusses this with [name] first. Paytin agrees to follow up with our clinic in 2 weeks.  Obesity Kallie is currently in the action stage of change. As such, her goal is to continue with weight loss efforts She has agreed to follow the Category 1 plan Aubrea has been instructed to work up to a goal of 150 minutes of combined cardio and strengthening exercise per week for weight loss and overall health benefits. We will check IC at next visit. We discussed the following Behavioral Modification Strategies today: work on meal planning and easy cooking plans and planning for success  Brantley has agreed to follow up with our clinic in 2 weeks. She was informed of the importance of frequent follow up visits to maximize her success with intensive lifestyle  modifications for her multiple health conditions.  ALLERGIES: Allergies  Allergen Reactions  . Penicillins Anaphylaxis    MEDICATIONS: Current Outpatient Medications on File Prior to Visit  Medication Sig Dispense Refill  . acetaminophen (TYLENOL) 325 MG tablet Take 1-2 tablets (325-650 mg total) by mouth every 4 (four) hours  as needed for mild pain.    Marland Kitchen albuterol (PROVENTIL HFA;VENTOLIN HFA) 108 (90 Base) MCG/ACT inhaler Inhale 2 puffs into the lungs every 6 (six) hours as needed for wheezing or shortness of breath. 1 Inhaler 11  . atorvastatin (LIPITOR) 40 MG tablet Take 1 tablet (40 mg total) by mouth daily at 6 PM. 30 tablet 6  . BD PEN NEEDLE NANO U/F 32G X 4 MM MISC 1 PACKAGE BY DOES NOT APPLY ROUTE 2 (TWO) TIMES DAILY. 100 each 0  . blood glucose meter kit and supplies KIT Dispense based on patient and insurance preference. Use up to four times daily as directed. (FOR ICD-9 250.00, 250.01). 1 each 0  . buPROPion (WELLBUTRIN XL) 150 MG 24 hr tablet Take 1 tablet (150 mg total) by mouth daily. 30 tablet 0  . cyclobenzaprine (FLEXERIL) 5 MG tablet TAKE 1 TABLET 3 TIMES A DAY AS NEEDED FOR MUSCLE SPASM SHOULDER/NECK PAIN 90 tablet 6  . diphenhydrAMINE (BENADRYL ALLERGY) 25 MG tablet Take 25 mg by mouth every 6 (six) hours as needed.    . DULoxetine (CYMBALTA) 30 MG capsule TAKE 1 CAPSULE BY MOUTH EVERYDAY AT BEDTIME 30 capsule 4  . gabapentin (NEURONTIN) 300 MG capsule TAKE 1 TABLET IN THE MORNING AND 2 TABLETS AT BEDTIME DAILY 90 capsule 3  . glucose blood (ONE TOUCH ULTRA TEST) test strip TEST TWICE A DAY 100 each 0  . losartan (COZAAR) 25 MG tablet Take 1 tablet (25 mg total) by mouth daily. 30 tablet 0  . metFORMIN (GLUCOPHAGE) 500 MG tablet Take 1 tablet (500 mg total) by mouth 2 (two) times daily with a meal. 180 tablet 3  . ONETOUCH DELICA LANCETS 69G MISC TES 2 (TWO) TIMES DAILY. 100 each 0  . zolpidem (AMBIEN) 5 MG tablet TAKE 1 TABLET BY MOUTH EVERY DAY AT BEDTIME AS NEEDED FOR SLEEP 30 tablet 3  . topiramate (TOPAMAX) 100 MG tablet Take 1 tablet (100 mg total) by mouth 2 (two) times daily. Take 129m in am and 2066mat night 90 tablet 3   No current facility-administered medications on file prior to visit.     PAST MEDICAL HISTORY: Past Medical History:  Diagnosis Date  . CVA (cerebral vascular  accident) ([facility]  . Depression   . DM type 2 (diabetes mellitus, type 2) ([facility]  . HLD (hyperlipidemia)   . HTN (hypertension)   . Joint pain   . Migraines   . Neck pain   . Polycystic disease, ovaries   . Right sided weakness   . Seizures ([facility]  . Shoulder pain   . Sickle cell trait ([facility]  . Thyroid nodule     PAST SURGICAL HISTORY: Past Surgical History:  Procedure Laterality Date  . LAPAROSCOPIC CHOLECYSTECTOMY      SOCIAL HISTORY: Social History   Tobacco Use  . Smoking status: Never Smoker  . Smokeless tobacco: Never Used  Substance Use Topics  . Alcohol use: No  . Drug use: No    FAMILY HISTORY: Family History  Problem Relation Age of Onset  . Heart attack Mother   . Diabetes Mother   . Hypertension Mother   .  Hyperlipidemia Mother   . Post-traumatic stress disorder Father        committed suicide  . Diabetes Father   . Hypertension Sister   . Thyroid cancer Paternal Grandmother     ROS: Review of Systems  Constitutional: Negative for weight loss.  Gastrointestinal: Negative for diarrhea, nausea and vomiting.  Musculoskeletal:       Negative for muscle weakness  Endo/Heme/Allergies:       Negative for hypoglycemia Negative fore polyphagia    PHYSICAL EXAM: Blood pressure (!) 162/113, pulse 91, temperature 98.1 F (36.7 C), temperature source Oral, height _0  (1.575 m), weight 211 lb (95.7 kg), SpO2 99 %. Body mass index is 38.59 kg/m. Physical Exam Vitals signs reviewed.  Constitutional:      Appearance: Normal appearance. She is obese.  Cardiovascular:     Rate and Rhythm: Normal rate.     Pulses: Normal pulses.  Pulmonary:     Effort: Pulmonary effort is normal.  Musculoskeletal: Normal range of motion.  Skin:    General: Skin is warm and dry.  Neurological:     Mental Status: She is alert and oriented to person, place, and time.  Psychiatric:        Mood and Affect: Mood normal.        Behavior: Behavior normal.      RECENT LABS AND TESTS: BMET    Component Value Date/Time   NA 142 09/11/2018 1126   K 3.9 09/11/2018 1126   CL 100 09/11/2018 1126   CO2 27 09/11/2018 1126   GLUCOSE 90 09/11/2018 1126   GLUCOSE 101 (H) 09/12/2017 1121   BUN 15 09/11/2018 1126   CREATININE 1.03 (H) 09/11/2018 1126   CREATININE 0.92 09/12/2017 1121   CALCIUM 9.1 09/11/2018 1126   GFRNONAA 66 09/11/2018 1126   GFRNONAA 71 03/17/2017 1513   GFRAA 76 09/11/2018 1126   GFRAA 82 03/17/2017 1513   Lab Results  Component Value Date   HGBA1C 5.6 10/01/2018   HGBA1C 5.6 08/10/2018   HGBA1C 7.4 (H) 05/28/2018   HGBA1C 6.2 02/06/2018   HGBA1C 5.8 07/25/2017   Lab Results  Component Value Date   INSULIN 13.7 09/11/2018   INSULIN 27.1 (H) 05/28/2018   CBC    Component Value Date/Time   WBC 9.3 05/28/2018 0918   WBC 5.3 09/12/2017 1121   RBC 5.50 (H) 05/28/2018 0918   RBC 4.90 09/12/2017 1121   HGB 13.5 05/28/2018 0918   HCT 41.5 05/28/2018 0918   PLT 308 09/12/2017 1121   MCV 76 (L) 05/28/2018 0918   MCH 24.5 (L) 05/28/2018 0918   MCH 25.1 (L) 09/12/2017 1121   MCHC 32.5 05/28/2018 0918   MCHC 32.6 09/12/2017 1121   RDW 18.2 (H) 05/28/2018 0918   LYMPHSABS 2.9 05/28/2018 0918   MONOABS 396 03/17/2017 1513   EOSABS 0.2 05/28/2018 0918   BASOSABS 0.0 05/28/2018 0918   Iron/TIBC/Ferritin/ %Sat    Component Value Date/Time   IRON 59 03/01/2017 2105   TIBC 291 03/01/2017 2105   FERRITIN 47 03/01/2017 2105   IRONPCTSAT 20 03/01/2017 2105   Lipid Panel     Component Value Date/Time   CHOL 163 09/11/2018 1126   TRIG 90 09/11/2018 1126   HDL 40 09/11/2018 1126   CHOLHDL 2.7 07/25/2017 1130   VLDL 27 02/26/2017 0341   LDLCALC 105 (H) 09/11/2018 1126   LDLCALC 70 07/25/2017 1130   Hepatic Function Panel     Component Value Date/Time   PROT  6.5 09/11/2018 1126   ALBUMIN 3.9 09/11/2018 1126   AST 9 09/11/2018 1126   ALT 11 09/11/2018 1126   ALKPHOS 98 09/11/2018 1126   BILITOT 0.3  09/11/2018 1126      Component Value Date/Time   TSH 0.57 12/03/2018 1416   TSH 1.060 05/28/2018 0918   TSH 0.76 11/30/2017 0954     Ref. Range 09/11/2018 11:26  Vitamin D, 25-Hydroxy Latest Ref Range: 30.0 - 100.0 ng/mL 32.7     OBESITY BEHAVIORAL INTERVENTION VISIT  Today's visit was # 12   Starting weight: 234 lbs Starting date: 05/28/2018 Today's weight :: 211 lbs Today's date: 12/05/2018 Total lbs lost to date: 23 At least 15 minutes were spent on discussing the following behavioral intervention visit.   ASK: We discussed the diagnosis of obesity with [name] today and Romonda agreed to give [name] permission to discuss obesity behavioral modification therapy today.  ASSESS: Chelbie has the diagnosis of obesity and her BMI today is 38.58 Zana is in the action stage of change   ADVISE: Chasitie was educated on the multiple health risks of obesity as well as the benefit of weight loss to improve her health. She was advised of the need for long term treatment and the importance of lifestyle modifications to improve her current health and to decrease her risk of future health problems.  AGREE: Multiple dietary modification options and treatment options were discussed and  Shanica agreed to follow the recommendations documented in the above note.  ARRANGE: Taeler was educated on the importance of frequent visits to treat obesity as outlined per CMS and USPSTF guidelines and agreed to schedule her next follow up appointment today.  I, Tammy Wysor, am acting as Location manager for [employer] I, Abby Potash, PA-C have reviewed above note and agree with its content

## 2018-12-17 ENCOUNTER — Other Ambulatory Visit: Payer: Self-pay | Admitting: Family Medicine

## 2018-12-20 ENCOUNTER — Other Ambulatory Visit: Payer: Self-pay | Admitting: Family Medicine

## 2018-12-20 DIAGNOSIS — I10 Essential (primary) hypertension: Secondary | ICD-10-CM

## 2018-12-23 ENCOUNTER — Other Ambulatory Visit (INDEPENDENT_AMBULATORY_CARE_PROVIDER_SITE_OTHER): Payer: Self-pay | Admitting: Physician Assistant

## 2018-12-23 DIAGNOSIS — E559 Vitamin D deficiency, unspecified: Secondary | ICD-10-CM

## 2018-12-24 ENCOUNTER — Ambulatory Visit (INDEPENDENT_AMBULATORY_CARE_PROVIDER_SITE_OTHER): Payer: Medicare Other | Admitting: Physician Assistant

## 2018-12-24 ENCOUNTER — Encounter (INDEPENDENT_AMBULATORY_CARE_PROVIDER_SITE_OTHER): Payer: Self-pay | Admitting: Physician Assistant

## 2018-12-24 VITALS — BP 123/89 | HR 101 | Temp 98.3°F | Ht 62.0 in | Wt 211.0 lb

## 2018-12-24 DIAGNOSIS — E7849 Other hyperlipidemia: Secondary | ICD-10-CM

## 2018-12-24 DIAGNOSIS — Z6838 Body mass index (BMI) 38.0-38.9, adult: Secondary | ICD-10-CM

## 2018-12-24 DIAGNOSIS — E119 Type 2 diabetes mellitus without complications: Secondary | ICD-10-CM | POA: Diagnosis not present

## 2018-12-24 DIAGNOSIS — E559 Vitamin D deficiency, unspecified: Secondary | ICD-10-CM

## 2018-12-24 NOTE — Progress Notes (Signed)
Office: [phone]  /  Fax: [phone]   HPI:   Chief Complaint: OBESITY Sharon Chan is here to discuss her progress with her obesity treatment plan. She is on the Category 1 plan and is following her eating plan approximately 95% of the time. She states she is doing aerobics and strength 30-60 minutes 5 times per week. Sharon Chan reports that she has been following the plan closely and exercising daily. She is frustrated with the lack of weight loss. Her weight is 211 lb (95.7 kg) today and has not lost weight since her last visit. She has lost 23 lbs since starting treatment with [name].  Diabetes II Sharon Chan has a diagnosis of diabetes type II. Sharon Chan states fasting blood sugars are between 80 and 100.She is on metformin and Victoza. She denies any hypoglycemic episodes. Last A1c was @ 5.6 on 10/01/2018. She has been working on intensive lifestyle modifications including diet, exercise, and weight loss to help control her blood glucose levels. Sharon Chan does report some nausea and vomiting recently.  Vitamin D deficiency Sharon Chan has a diagnosis of vitamin D deficiency. She is currently taking prescription Vit D and denies nausea, vomiting or muscle weakness.  Hyperlipidemia Sharon Chan has hyperlipidemia and has been trying to improve her cholesterol levels with intensive lifestyle modification including a low saturated fat diet, exercise and weight loss. She os on atorvastatin.  ASSESSMENT AND PLAN:  Type 2 diabetes mellitus without complication, without long-term current use of insulin (Sharon Chan) - Plan: Hemoglobin A1c, Insulin, random  Vitamin D deficiency - Plan: VITAMIN D 25 Hydroxy (Vit-D Deficiency, Fractures)  Other hyperlipidemia - Plan: Comprehensive metabolic panel, Lipid Panel With LDL/HDL Ratio  Class 2 severe obesity with serious comorbidity and body mass index (BMI) of 38.0 to 38.9 in adult, unspecified obesity type ([place])  PLAN:  Diabetes II Sharon Chan has been given extensive  diabetes education by myself today including ideal fasting and post-prandial blood glucose readings, individual ideal Hgb A1c goals  and hypoglycemia prevention. We discussed the importance of good blood sugar control to decrease the likelihood of diabetic complications such as nephropathy, neuropathy, limb loss, blindness, coronary artery disease, and death. We discussed the importance of intensive lifestyle modification including diet, exercise and weight loss as the first line treatment for diabetes. Sharon Chan agrees to decrease her dose of Victoza to 0.6; no refills are needed at this time. Sharon Chan agrees to follow-up with our clinic in 2 weeks.  Vitamin D Deficiency Sharon Chan was informed that low Vitamin D levels contributes to fatigue and are associated with obesity, breast, and colon cancer. She agrees to continue to take prescription Vit D @ 50,000 IU every week  and will follow-up for routine testing of Vitamin D, at least 2-3 times per year. She was informed of the risk of over-replacement of Vitamin D and agrees to not increase her dose unless she discusses this with [name] first. Sharon Chan agrees to follow-up with our clinic in 2 weeks.  Hyperlipidemia Sharon Chan was informed of the American Heart Association Guidelines emphasizing intensive lifestyle modifications as the first line treatment for hyperlipidemia. We discussed many lifestyle modifications today in depth, and Sharon Chan will continue to work on decreasing saturated fats such as fatty red meat, butter and many fried foods. She will also increase vegetables and lean protein in her diet and continue to work on exercise and weight loss efforts. Sharon Chan will continue atorvastatin and agrees to follow-up with our clinic in 2 weeks.  Obesity Sharon Chan is currently in the action stage of  change. As such, her goal is to continue with weight loss efforts. She has agreed to change her diet and follow the Category 2 plan. Sharon Chan has been instructed to work  up to a goal of 150 minutes of combined cardio and strengthening exercise per week for weight loss and overall health benefits. We discussed the following Behavioral Modification Strategies today: work on meal planning and easy cooking plans.  Sharon Chan has agreed to follow up with our clinic in 2 weeks. She was informed of the importance of frequent follow up visits to maximize her success with intensive lifestyle modifications for her multiple health conditions.  ALLERGIES: Allergies  Allergen Reactions  . Penicillins Anaphylaxis    MEDICATIONS: Current Outpatient Medications on File Prior to Visit  Medication Sig Dispense Refill  . acetaminophen (TYLENOL) 325 MG tablet Take 1-2 tablets (325-650 mg total) by mouth every 4 (four) hours as needed for mild pain.    Marland Kitchen albuterol (PROVENTIL HFA;VENTOLIN HFA) 108 (90 Base) MCG/ACT inhaler Inhale 2 puffs into the lungs every 6 (six) hours as needed for wheezing or shortness of breath. 1 Inhaler 11  . atorvastatin (LIPITOR) 40 MG tablet Take 1 tablet (40 mg total) by mouth daily at 6 PM. 30 tablet 6  . BD PEN NEEDLE NANO U/F 32G X 4 MM MISC 1 PACKAGE BY DOES NOT APPLY ROUTE 2 (TWO) TIMES DAILY. 100 each 0  . blood glucose meter kit and supplies KIT Dispense based on patient and insurance preference. Use up to four times daily as directed. (FOR ICD-9 250.00, 250.01). 1 each 0  . buPROPion (WELLBUTRIN XL) 150 MG 24 hr tablet Take 1 tablet (150 mg total) by mouth daily. 30 tablet 0  . cyclobenzaprine (FLEXERIL) 5 MG tablet TAKE 1 TABLET 3 TIMES A DAY AS NEEDED FOR MUSCLE SPASM SHOULDER/NECK PAIN 90 tablet 6  . diphenhydrAMINE (BENADRYL ALLERGY) 25 MG tablet Take 25 mg by mouth every 6 (six) hours as needed.    . DULoxetine (CYMBALTA) 30 MG capsule TAKE 1 CAPSULE BY MOUTH EVERYDAY AT BEDTIME 30 capsule 4  . gabapentin (NEURONTIN) 300 MG capsule TAKE 1 TABLET IN THE MORNING AND 2 TABLETS AT BEDTIME DAILY 90 capsule 3  . glucose blood (ONE TOUCH ULTRA TEST)  test strip TEST TWICE A DAY 100 each 0  . liraglutide (VICTOZA) 18 MG/3ML SOPN Inject 0.2 mLs (1.2 mg total) into the skin every morning. 2 pen 0  . losartan (COZAAR) 25 MG tablet Take 1 tablet (25 mg total) by mouth daily. 30 tablet 0  . metFORMIN (GLUCOPHAGE) 500 MG tablet Take 1 tablet (500 mg total) by mouth 2 (two) times daily with a meal. 180 tablet 3  . ONETOUCH DELICA LANCETS 25D MISC TES 2 (TWO) TIMES DAILY. 100 each 0  . Vitamin D, Ergocalciferol, (DRISDOL) 1.25 MG (50000 UT) CAPS capsule Take 1 capsule (50,000 Units total) by mouth every 7 (seven) days. 4 capsule 0  . zolpidem (AMBIEN) 5 MG tablet TAKE 1 TABLET BY MOUTH EVERY DAY AT BEDTIME AS NEEDED FOR SLEEP 30 tablet 3  . topiramate (TOPAMAX) 100 MG tablet Take 1 tablet (100 mg total) by mouth 2 (two) times daily. Take 142m in am and 2071mat night 90 tablet 3   No current facility-administered medications on file prior to visit.     PAST MEDICAL HISTORY: Past Medical History:  Diagnosis Date  . CVA (cerebral vascular accident) ([facility]  . Depression   . DM type 2 (diabetes mellitus, type  2) ([place])   . HLD (hyperlipidemia)   . HTN (hypertension)   . Joint pain   . Migraines   . Neck pain   . Polycystic disease, ovaries   . Right sided weakness   . Seizures ([place])   . Shoulder pain   . Sickle cell trait ([place])   . Thyroid nodule     PAST SURGICAL HISTORY: Past Surgical History:  Procedure Laterality Date  . LAPAROSCOPIC CHOLECYSTECTOMY      SOCIAL HISTORY: Social History   Tobacco Use  . Smoking status: Never Smoker  . Smokeless tobacco: Never Used  Substance Use Topics  . Alcohol use: No  . Drug use: No    FAMILY HISTORY: Family History  Problem Relation Age of Onset  . Heart attack Mother   . Diabetes Mother   . Hypertension Mother   . Hyperlipidemia Mother   . Post-traumatic stress disorder Father        committed suicide  . Diabetes Father   . Hypertension Sister   . Thyroid cancer Paternal  Grandmother    ROS: Review of Systems  Constitutional: Negative for weight loss.  Cardiovascular: Negative for chest pain.  Gastrointestinal: Positive for nausea and vomiting. Negative for diarrhea.  Musculoskeletal:       Negative for muscle weakness.  Endo/Heme/Allergies:       Negative for polyphagia. Negative for hypoglycemia.   PHYSICAL EXAM: Blood pressure 123/89, pulse (!) 101, temperature 98.3 F (36.8 C), temperature source Oral, height 5' 2" (1.575 m), weight 211 lb (95.7 kg), SpO2 99 %. Body mass index is 38.59 kg/m. Physical Exam Vitals signs reviewed.  Constitutional:      Appearance: Normal appearance. She is obese.  Cardiovascular:     Rate and Rhythm: Normal rate.     Pulses: Normal pulses.  Pulmonary:     Effort: Pulmonary effort is normal.     Breath sounds: Normal breath sounds.  Musculoskeletal: Normal range of motion.  Skin:    General: Skin is warm and dry.  Neurological:     Mental Status: She is alert and oriented to person, place, and time.  Psychiatric:        Behavior: Behavior normal.   RECENT LABS AND TESTS: BMET    Component Value Date/Time   NA 142 09/11/2018 1126   K 3.9 09/11/2018 1126   CL 100 09/11/2018 1126   CO2 27 09/11/2018 1126   GLUCOSE 90 09/11/2018 1126   GLUCOSE 101 (H) 09/12/2017 1121   BUN 15 09/11/2018 1126   CREATININE 1.03 (H) 09/11/2018 1126   CREATININE 0.92 09/12/2017 1121   CALCIUM 9.1 09/11/2018 1126   GFRNONAA 66 09/11/2018 1126   GFRNONAA 71 03/17/2017 1513   GFRAA 76 09/11/2018 1126   GFRAA 82 03/17/2017 1513   Lab Results  Component Value Date   HGBA1C 5.6 10/01/2018   HGBA1C 5.6 08/10/2018   HGBA1C 7.4 (H) 05/28/2018   HGBA1C 6.2 02/06/2018   HGBA1C 5.8 07/25/2017   Lab Results  Component Value Date   INSULIN 13.7 09/11/2018   INSULIN 27.1 (H) 05/28/2018   CBC    Component Value Date/Time   WBC 9.3 05/28/2018 0918   WBC 5.3 09/12/2017 1121   RBC 5.50 (H) 05/28/2018 0918   RBC 4.90  09/12/2017 1121   HGB 13.5 05/28/2018 0918   HCT 41.5 05/28/2018 0918   PLT 308 09/12/2017 1121   MCV 76 (L) 05/28/2018 0918   MCH 24.5 (L) 05/28/2018 0918   MCH  25.1 (L) 09/12/2017 1121   MCHC 32.5 05/28/2018 0918   MCHC 32.6 09/12/2017 1121   RDW 18.2 (H) 05/28/2018 0918   LYMPHSABS 2.9 05/28/2018 0918   MONOABS 396 03/17/2017 1513   EOSABS 0.2 05/28/2018 0918   BASOSABS 0.0 05/28/2018 0918   Iron/TIBC/Ferritin/ %Sat    Component Value Date/Time   IRON 59 03/01/2017 2105   TIBC 291 03/01/2017 2105   FERRITIN 47 03/01/2017 2105   IRONPCTSAT 20 03/01/2017 2105   Lipid Panel     Component Value Date/Time   CHOL 163 09/11/2018 1126   TRIG 90 09/11/2018 1126   HDL 40 09/11/2018 1126   CHOLHDL 2.7 07/25/2017 1130   VLDL 27 02/26/2017 0341   LDLCALC 105 (H) 09/11/2018 1126   LDLCALC 70 07/25/2017 1130   Hepatic Function Panel     Component Value Date/Time   PROT 6.5 09/11/2018 1126   ALBUMIN 3.9 09/11/2018 1126   AST 9 09/11/2018 1126   ALT 11 09/11/2018 1126   ALKPHOS 98 09/11/2018 1126   BILITOT 0.3 09/11/2018 1126      Component Value Date/Time   TSH 0.57 12/03/2018 1416   TSH 1.060 05/28/2018 0918   TSH 0.76 11/30/2017 0954   Results for ANNYA, LIZANA (MRN [id number]) as of 12/24/2018 12:38  Ref. Range 09/11/2018 11:26  Vitamin D, 25-Hydroxy Latest Ref Range: 30.0 - 100.0 ng/mL 32.7   OBESITY BEHAVIORAL INTERVENTION VISIT  Today's visit was #13  Starting weight: 234 Starting date: 05/28/2018 Today's weight: 211 Today's date: 12/24/2018 Total lbs lost to date: 23 At least 15 minutes were spent on discussing the following behavioral intervention visit.  ASK: We discussed the diagnosis of obesity with Sharon Chan today and Sharon Chan agreed to give [name] permission to discuss obesity behavioral modification therapy today.  ASSESS: Sharon Chan has the diagnosis of obesity and her BMI today is @ 38.59.  Najwa is in the action stage of  change.  ADVISE: Sharon Chan was educated on the multiple health risks of obesity as well as the benefit of weight loss to improve her health. She was advised of the need for long term treatment and the importance of lifestyle modifications to improve her current health and to decrease her risk of future health problems.  AGREE: Multiple dietary modification options and treatment options were discussed and  Sharon Chan agreed to follow the recommendations documented in the above note.  ARRANGE: Reyes was educated on the importance of frequent visits to treat obesity as outlined per CMS and USPSTF guidelines and agreed to schedule her next follow up appointment today.  Migdalia Dk, am acting as transcriptionist for [employer] PA-C I, Abby Potash, PA-C have reviewed above note and agree with its content

## 2018-12-25 LAB — COMPREHENSIVE METABOLIC PANEL
ALT: 16 IU/L (ref 0–32)
AST: 12 IU/L (ref 0–40)
Albumin/Globulin Ratio: 1.8 (ref 1.2–2.2)
Albumin: 3.9 g/dL (ref 3.8–4.8)
Alkaline Phosphatase: 94 IU/L (ref 39–117)
BILIRUBIN TOTAL: 0.3 mg/dL (ref 0.0–1.2)
BUN/Creatinine Ratio: 14 (ref 9–23)
BUN: 17 mg/dL (ref 6–24)
CHLORIDE: 109 mmol/L — AB (ref 96–106)
CO2: 18 mmol/L — ABNORMAL LOW (ref 20–29)
Calcium: 9 mg/dL (ref 8.7–10.2)
Creatinine, Ser: 1.18 mg/dL — ABNORMAL HIGH (ref 0.57–1.00)
GFR calc Af Amer: 64 mL/min/{1.73_m2} (ref >59)
GFR calc non Af Amer: 56 mL/min/{1.73_m2} — ABNORMAL LOW (ref >59)
Globulin, Total: 2.2 g/dL (ref 1.5–4.5)
Glucose: 85 mg/dL (ref 65–99)
Potassium: 4.3 mmol/L (ref 3.5–5.2)
Sodium: 140 mmol/L (ref 134–144)
Total Protein: 6.1 g/dL (ref 6.0–8.5)

## 2018-12-25 LAB — VITAMIN D 25 HYDROXY (VIT D DEFICIENCY, FRACTURES): VIT D 25 HYDROXY: 43 ng/mL (ref 30.0–100.0)

## 2018-12-25 LAB — LIPID PANEL WITH LDL/HDL RATIO
Cholesterol, Total: 117 mg/dL (ref 100–199)
HDL: 46 mg/dL (ref >39)
LDL Calculated: 62 mg/dL (ref 0–99)
LDl/HDL Ratio: 1.3 ratio (ref 0.0–3.2)
Triglycerides: 43 mg/dL (ref 0–149)
VLDL Cholesterol Cal: 9 mg/dL (ref 5–40)

## 2018-12-25 LAB — INSULIN, RANDOM: INSULIN: 15.6 u[IU]/mL (ref 2.6–24.9)

## 2018-12-25 LAB — HEMOGLOBIN A1C
Est. average glucose Bld gHb Est-mCnc: 120 mg/dL
Hgb A1c MFr Bld: 5.8 % — ABNORMAL HIGH (ref 4.8–5.6)

## 2019-01-01 ENCOUNTER — Ambulatory Visit (INDEPENDENT_AMBULATORY_CARE_PROVIDER_SITE_OTHER): Payer: Medicare Other | Admitting: Family Medicine

## 2019-01-01 ENCOUNTER — Encounter: Payer: Self-pay | Admitting: Family Medicine

## 2019-01-01 VITALS — BP 138/92 | HR 96 | Temp 98.4°F | Ht 62.0 in | Wt 217.0 lb

## 2019-01-01 DIAGNOSIS — F419 Anxiety disorder, unspecified: Secondary | ICD-10-CM | POA: Diagnosis not present

## 2019-01-01 DIAGNOSIS — I6930 Unspecified sequelae of cerebral infarction: Secondary | ICD-10-CM | POA: Diagnosis not present

## 2019-01-01 DIAGNOSIS — I10 Essential (primary) hypertension: Secondary | ICD-10-CM

## 2019-01-01 DIAGNOSIS — E119 Type 2 diabetes mellitus without complications: Secondary | ICD-10-CM | POA: Diagnosis not present

## 2019-01-01 DIAGNOSIS — Z09 Encounter for follow-up examination after completed treatment for conditions other than malignant neoplasm: Secondary | ICD-10-CM

## 2019-01-01 MED ORDER — BUPROPION HCL ER (SR) 100 MG PO TB12: 100.0000 mg | ORAL_TABLET | Freq: Two times a day (BID) | ORAL | 3 refills | Status: DC

## 2019-01-01 NOTE — Progress Notes (Signed)
Patient [facility]  Established Patient Office Visit  Subjective:  Patient ID: Sharon Chan, female    DOB: 11-17-1975  Age: 43 y.o. MRN: [id number]  CC:  Chief Complaint  Patient presents with  . Follow-up    chronic condition     HPI Sharon Chan is a 43 year old female who presents for follow up.   Past Medical History:  Diagnosis Date  . CVA (cerebral vascular accident) ([place])   . Depression   . DM type 2 (diabetes mellitus, type 2) ([place])   . HLD (hyperlipidemia)   . HTN (hypertension)   . Joint pain   . Migraines   . Neck pain   . Polycystic disease, ovaries   . Right sided weakness   . Seizures ([place])   . Shoulder pain   . Sickle cell trait ([place])   . Thyroid nodule    Current Status: Since her last office visit, she continues to have pain in knees and shoulders, and residual right sided extremity weakness, post stroke. She has had a few falls since our last office visit. She has noticed moderate dizziness lately. Her anxiety is worsening today. She denies suicidal ideations, homicidal ideations, or auditory hallucinations. She continues to follow up with Psychiatrist weekly. She continues to work with [employer] and Wellness classes.  She denies fevers, chills, fatigue, recent infections, weight loss, and night sweats. She has not had any headaches, and visual changes. No chest pain, heart palpitations, cough and shortness of breath reported. No reports of GI problems such as nausea, vomiting, diarrhea, and constipation. She has no reports of blood in stools, dysuria and hematuria.   Past Surgical History:  Procedure Laterality Date  . LAPAROSCOPIC CHOLECYSTECTOMY      Family History  Problem Relation Age of Onset  . Heart attack Mother   . Diabetes Mother   . Hypertension Mother   . Hyperlipidemia Mother   . Post-traumatic stress disorder Father        committed suicide  . Diabetes Father   .  Hypertension Sister   . Thyroid cancer Paternal Grandmother     Social History   Socioeconomic History  . Marital status: Single    Spouse name: Not on file  . Number of children: 0  . Years of education: Not on file  . Highest education level: Not on file  Occupational History  . Not on file  Social Needs  . Financial resource strain: Not on file  . Food insecurity:    Worry: Not on file    Inability: Not on file  . Transportation needs:    Medical: Not on file    Non-medical: Not on file  Tobacco Use  . Smoking status: Never Smoker  . Smokeless tobacco: Never Used  Substance and Sexual Activity  . Alcohol use: No  . Drug use: No  . Sexual activity: Not on file  Lifestyle  . Physical activity:    Days per week: Not on file    Minutes per session: Not on file  . Stress: Not on file  Relationships  . Social connections:    Talks on phone: Not on file    Gets together: Not on file    Attends religious service: Not on file    Active member of club or organization: Not on file    Attends meetings of clubs or organizations: Not on file    Relationship status: Not on  file  . Intimate partner violence:    Fear of current or ex partner: Not on file    Emotionally abused: Not on file    Physically abused: Not on file    Forced sexual activity: Not on file  Other Topics Concern  . Not on file  Social History Narrative   Lives at home, mom currently staying with her   Left-handed   Caffeine: occasional decaf coffee or raspberry tea    Outpatient Medications Prior to Visit  Medication Sig Dispense Refill  . acetaminophen (TYLENOL) 325 MG tablet Take 1-2 tablets (325-650 mg total) by mouth every 4 (four) hours as needed for mild pain.    Marland Kitchen albuterol (PROVENTIL HFA;VENTOLIN HFA) 108 (90 Base) MCG/ACT inhaler Inhale 2 puffs into the lungs every 6 (six) hours as needed for wheezing or shortness of breath. 1 Inhaler 11  . atorvastatin (LIPITOR) 40 MG tablet Take 1 tablet (40  mg total) by mouth daily at 6 PM. 30 tablet 6  . BD PEN NEEDLE NANO U/F 32G X 4 MM MISC 1 PACKAGE BY DOES NOT APPLY ROUTE 2 (TWO) TIMES DAILY. 100 each 0  . blood glucose meter kit and supplies KIT Dispense based on patient and insurance preference. Use up to four times daily as directed. (FOR ICD-9 250.00, 250.01). 1 each 0  . cyclobenzaprine (FLEXERIL) 5 MG tablet TAKE 1 TABLET 3 TIMES A DAY AS NEEDED FOR MUSCLE SPASM SHOULDER/NECK PAIN 90 tablet 6  . diphenhydrAMINE (BENADRYL ALLERGY) 25 MG tablet Take 25 mg by mouth every 6 (six) hours as needed.    . DULoxetine (CYMBALTA) 30 MG capsule TAKE 1 CAPSULE BY MOUTH EVERYDAY AT BEDTIME 30 capsule 4  . gabapentin (NEURONTIN) 300 MG capsule TAKE 1 TABLET IN THE MORNING AND 2 TABLETS AT BEDTIME DAILY 90 capsule 3  . glucose blood (ONE TOUCH ULTRA TEST) test strip TEST TWICE A DAY 100 each 0  . liraglutide (VICTOZA) 18 MG/3ML SOPN Inject 0.2 mLs (1.2 mg total) into the skin every morning. 2 pen 0  . losartan (COZAAR) 25 MG tablet Take 1 tablet (25 mg total) by mouth daily. 30 tablet 0  . metFORMIN (GLUCOPHAGE) 500 MG tablet Take 1 tablet (500 mg total) by mouth 2 (two) times daily with a meal. 180 tablet 3  . ONETOUCH DELICA LANCETS 40H MISC TES 2 (TWO) TIMES DAILY. 100 each 0  . topiramate (TOPAMAX) 100 MG tablet Take 1 tablet (100 mg total) by mouth 2 (two) times daily. Take 113m in am and 2052mat night 90 tablet 3  . Vitamin D, Ergocalciferol, (DRISDOL) 1.25 MG (50000 UT) CAPS capsule Take 1 capsule (50,000 Units total) by mouth every 7 (seven) days. 4 capsule 0  . zolpidem (AMBIEN) 5 MG tablet TAKE 1 TABLET BY MOUTH EVERY DAY AT BEDTIME AS NEEDED FOR SLEEP 30 tablet 3  . buPROPion (WELLBUTRIN XL) 150 MG 24 hr tablet Take 1 tablet (150 mg total) by mouth daily. 30 tablet 0   No facility-administered medications prior to visit.     Allergies  Allergen Reactions  . Penicillins Anaphylaxis    ROS Review of Systems  Constitutional: Negative.     HENT: Negative.   Eyes: Negative.   Respiratory: Negative.   Cardiovascular: Negative.   Gastrointestinal: Negative.   Endocrine: Negative.   Genitourinary: Negative.   Musculoskeletal: Positive for arthralgias (chronic knee and shoulder pain).  Skin: Negative.   Neurological: Positive for dizziness, weakness (right sided weakness) and headaches.  Objective:    Physical Exam  Constitutional: She is oriented to person, place, and time. She appears well-developed and well-nourished.  HENT:  Head: Normocephalic and atraumatic.  Eyes: Conjunctivae are normal.  Neck: Normal range of motion. Neck supple.  Cardiovascular: Normal rate, regular rhythm, normal heart sounds and intact distal pulses.  Pulmonary/Chest: Effort normal and breath sounds normal.  Abdominal: Soft. Bowel sounds are normal.  Musculoskeletal: Normal range of motion.  Neurological: She is alert and oriented to person, place, and time. She has normal reflexes.  Skin: Skin is warm and dry.  Psychiatric: Her behavior is normal. Judgment and thought content normal.  Nursing note reviewed.  BP (!) 138/92 (BP Location: Left Arm, Patient Position: Sitting, Cuff Size: Large)   Pulse 96   Temp 98.4 F (36.9 C) (Oral)   Ht 5' 2"  (1.575 m)   Wt 217 lb (98.4 kg)   LMP 12/31/2018   SpO2 100%   BMI 39.69 kg/m  Wt Readings from Last 3 Encounters:  01/01/19 217 lb (98.4 kg)  12/24/18 211 lb (95.7 kg)  12/05/18 211 lb (95.7 kg)     Health Maintenance Due  Topic Date Due  . PNEUMOCOCCAL POLYSACCHARIDE VACCINE AGE 55-64 HIGH RISK  11/15/1975  . OPHTHALMOLOGY EXAM  11/15/1983  . PAP SMEAR-Modifier  11/14/1994    There are no preventive care reminders to display for this patient.  Lab Results  Component Value Date   TSH 0.57 12/03/2018   Lab Results  Component Value Date   WBC 9.3 05/28/2018   HGB 13.5 05/28/2018   HCT 41.5 05/28/2018   MCV 76 (L) 05/28/2018   PLT 308 09/12/2017   Lab Results  Component  Value Date   NA 140 12/24/2018   K 4.3 12/24/2018   CO2 18 (L) 12/24/2018   GLUCOSE 85 12/24/2018   BUN 17 12/24/2018   CREATININE 1.18 (H) 12/24/2018   BILITOT 0.3 12/24/2018   ALKPHOS 94 12/24/2018   AST 12 12/24/2018   ALT 16 12/24/2018   PROT 6.1 12/24/2018   ALBUMIN 3.9 12/24/2018   CALCIUM 9.0 12/24/2018   ANIONGAP 8 03/08/2017   Lab Results  Component Value Date   CHOL 117 12/24/2018   Lab Results  Component Value Date   HDL 46 12/24/2018   Lab Results  Component Value Date   LDLCALC 62 12/24/2018   Lab Results  Component Value Date   TRIG 43 12/24/2018   Lab Results  Component Value Date   CHOLHDL 2.7 07/25/2017   Lab Results  Component Value Date   HGBA1C 5.8 (H) 12/24/2018   Assessment & Plan:   1. History of CVA with residual deficit Stable. No signs or symptoms of recurrence noted or reported. Monitor.   2. Type 2 diabetes mellitus without complication, without long-term current use of insulin (HCC) Negative. Foot exam tolerated well. No decreased sensitivity noted upon foot exam. Patient counseled on proper foot hygiene. She is encouraged to exam feet often (daily), using mirror if necessary; keep feet clean and dry (especially between toes), keep feet moistened, wear cotton socks, and avoid wearing open-toed shoes, high-heel shoes, and sandals. Patient verbalized understanding.  - HM Diabetes Foot Exam  3. Essential hypertension Blood pressure is stable at 138/92 today. She will continue to decrease high sodium intake, excessive alcohol intake, increase potassium intake, smoking cessation, and increase physical activity of at least 30 minutes of cardio activity daily. She will continue to follow Heart Healthy or DASH diet.  4.  Anxiety We will increase Wellbutrin to 100 mg today.  - buPROPion (WELLBUTRIN SR) 100 MG 12 hr tablet; Take 1 tablet (100 mg total) by mouth 2 (two) times daily.  Dispense: 60 tablet; Refill: 3  5. Follow up She will follow  up in 1 month.  - POCT urinalysis dipstick  Meds ordered this encounter  Medications  . buPROPion (WELLBUTRIN SR) 100 MG 12 hr tablet    Sig: Take 1 tablet (100 mg total) by mouth 2 (two) times daily.    Dispense:  60 tablet    Refill:  3   Problem List Items Addressed This Visit      Cardiovascular and Mediastinum   Essential hypertension     Endocrine   Type 2 diabetes mellitus without complication, without long-term current use of insulin (HCC)   Relevant Orders   HM Diabetes Foot Exam (Completed)    Other Visit Diagnoses    History of CVA with residual deficit    -  Primary   Anxiety       Relevant Medications   buPROPion (WELLBUTRIN SR) 100 MG 12 hr tablet   Follow up       Relevant Orders   POCT urinalysis dipstick      Meds ordered this encounter  Medications  . buPROPion (WELLBUTRIN SR) 100 MG 12 hr tablet    Sig: Take 1 tablet (100 mg total) by mouth 2 (two) times daily.    Dispense:  60 tablet    Refill:  3    Follow-up: Return in about 1 month (around 01/30/2019).    Azzie Glatter, FNP

## 2019-01-01 NOTE — Patient Instructions (Signed)
Living With Anxiety  After being diagnosed with an anxiety disorder, you may be relieved to know why you have felt or behaved a certain way. It is natural to also feel overwhelmed about the treatment ahead and what it will mean for your life. With care and support, you can manage this condition and recover from it. How to cope with anxiety Dealing with stress Stress is your body's reaction to life changes and events, both good and bad. Stress can last just a few hours or it can be ongoing. Stress can play a major role in anxiety, so it is important to learn both how to cope with stress and how to think about it differently. Talk with your health care provider or a counselor to learn more about stress reduction. He or she may suggest some stress reduction techniques, such as:  Music therapy. This can include creating or listening to music that you enjoy and that inspires you.  Mindfulness-based meditation. This involves being aware of your normal breaths, rather than trying to control your breathing. It can be done while sitting or walking.  Centering prayer. This is a kind of meditation that involves focusing on a word, phrase, or sacred image that is meaningful to you and that brings you peace.  Deep breathing. To do this, expand your stomach and inhale slowly through your nose. Hold your breath for 3-5 seconds. Then exhale slowly, allowing your stomach muscles to relax.  Self-talk. This is a skill where you identify thought patterns that lead to anxiety reactions and correct those thoughts.  Muscle relaxation. This involves tensing muscles then relaxing them. Choose a stress reduction technique that fits your lifestyle and personality. Stress reduction techniques take time and practice. Set aside 5-15 minutes a day to do them. Therapists can offer training in these techniques. The training may be covered by some insurance plans. Other things you can do to manage stress include:  Keeping a  stress diary. This can help you learn what triggers your stress and ways to control your response.  Thinking about how you respond to certain situations. You may not be able to control everything, but you can control your reaction.  Making time for activities that help you relax, and not feeling guilty about spending your time in this way. Therapy combined with coping and stress-reduction skills provides the best chance for successful treatment. Medicines Medicines can help ease symptoms. Medicines for anxiety include:  Anti-anxiety drugs.  Antidepressants.  Beta-blockers. Medicines may be used as the main treatment for anxiety disorder, along with therapy, or if other treatments are not working. Medicines should be prescribed by a health care provider. Relationships Relationships can play a big part in helping you recover. Try to spend more time connecting with trusted friends and family members. Consider going to couples counseling, taking family education classes, or going to family therapy. Therapy can help you and others better understand the condition. How to recognize changes in your condition Everyone has a different response to treatment for anxiety. Recovery from anxiety happens when symptoms decrease and stop interfering with your daily activities at home or work. This may mean that you will start to:  Have better concentration and focus.  Sleep better.  Be less irritable.  Have more energy.  Have improved memory. It is important to recognize when your condition is getting worse. Contact your health care provider if your symptoms interfere with home or work and you do not feel like your condition is improving. Where   important to recognize when your condition is getting worse. Contact your health care provider if your symptoms interfere with home or work and you do not feel like your condition is improving.  Where to find help and support:  You can get help and support from these sources:  · Self-help groups.  · Online and community organizations.  · A trusted spiritual leader.  · Couples counseling.  · Family education classes.  · Family therapy.  Follow these instructions  at home:  · Eat a healthy diet that includes plenty of vegetables, fruits, whole grains, low-fat dairy products, and lean protein. Do not eat a lot of foods that are high in solid fats, added sugars, or salt.  · Exercise. Most adults should do the following:  ? Exercise for at least 150 minutes each week. The exercise should increase your heart rate and make you sweat (moderate-intensity exercise).  ? Strengthening exercises at least twice a week.  · Cut down on caffeine, tobacco, alcohol, and other potentially harmful substances.  · Get the right amount and quality of sleep. Most adults need 7-9 hours of sleep each night.  · Make choices that simplify your life.  · Take over-the-counter and prescription medicines only as told by your health care provider.  · Avoid caffeine, alcohol, and certain over-the-counter cold medicines. These may make you feel worse. Ask your pharmacist which medicines to avoid.  · Keep all follow-up visits as told by your health care provider. This is important.  Questions to ask your health care provider  · Would I benefit from therapy?  · How often should I follow up with a health care provider?  · How long do I need to take medicine?  · Are there any long-term side effects of my medicine?  · Are there any alternatives to taking medicine?  Contact a health care provider if:  · You have a hard time staying focused or finishing daily tasks.  · You spend many hours a day feeling worried about everyday life.  · You become exhausted by worry.  · You start to have headaches, feel tense, or have nausea.  · You urinate more than normal.  · You have diarrhea.  Get help right away if:  · You have a racing heart and shortness of breath.  · You have thoughts of hurting yourself or others.  If you ever feel like you may hurt yourself or others, or have thoughts about taking your own life, get help right away. You can go to your nearest emergency department or call:  · Your local emergency services  (911 in the [place]  · A suicide crisis helpline, such as the National Suicide Prevention Lifeline at 1-800-273-8255. This is open 24-hours a day.  Summary  · Taking steps to deal with stress can help calm you.  · Medicines cannot cure anxiety disorders, but they can help ease symptoms.  · Family, friends, and partners can play a big part in helping you recover from an anxiety disorder.  This information is not intended to replace advice given to you by your health care provider. Make sure you discuss any questions you have with your health care provider.  Document Released: 10/25/2016 Document Revised: 10/25/2016 Document Reviewed: 10/25/2016  Elsevier Interactive Patient Education © 2019 [employer]    Bupropion extended-release tablets (Depression/Mood Disorders)  What is this medicine?  BUPROPION (byoo PROE pee on) is used to treat depression.  This medicine may be used for other purposes;   ask your health care provider or pharmacist if you have questions.  COMMON BRAND NAME(S): Aplenzin, Budeprion XL, Forfivo XL, Wellbutrin XL  What should I tell my health care provider before I take this medicine?  They need to know if you have any of these conditions:  -an eating disorder, such as anorexia or bulimia  -bipolar disorder or psychosis  -diabetes or high blood sugar, treated with medication  -glaucoma  -head injury or brain tumor  -heart disease, previous heart attack, or irregular heart beat  -high blood pressure  -kidney or liver disease  -seizures (convulsions)  -suicidal thoughts or a previous suicide attempt  -Tourette's syndrome  -weight loss  -an unusual or allergic reaction to bupropion, other medicines, foods, dyes, or preservatives  -breast-feeding  -pregnant or trying to become pregnant  How should I use this medicine?  Take this medicine by mouth with a glass of water. Follow the directions on the prescription label. You can take it with or without food. If it upsets your stomach, take it with food.  Do not crush, chew, or cut these tablets. This medicine is taken once daily at the same time each day. Do not take your medicine more often than directed. Do not stop taking this medicine suddenly except upon the advice of your doctor. Stopping this medicine too quickly may cause serious side effects or your condition may worsen.  A special MedGuide will be given to you by the pharmacist with each prescription and refill. Be sure to read this information carefully each time.  Talk to your pediatrician regarding the use of this medicine in children. Special care may be needed.  Overdosage: If you think you have taken too much of this medicine contact a poison control center or emergency room at once.  NOTE: This medicine is only for you. Do not share this medicine with others.  What if I miss a dose?  If you miss a dose, skip the missed dose and take your next tablet at the regular time. Do not take double or extra doses.  What may interact with this medicine?  Do not take this medicine with any of the following medications:  -linezolid  -MAOIs like Azilect, Carbex, Eldepryl, Marplan, Nardil, and Parnate  -methylene blue (injected into a vein)  -other medicines that contain bupropion like Zyban  This medicine may also interact with the following medications:  -alcohol  -certain medicines for anxiety or sleep  -certain medicines for blood pressure like metoprolol, propranolol  -certain medicines for depression or psychotic disturbances  -certain medicines for HIV or AIDS like efavirenz, lopinavir, nelfinavir, ritonavir  -certain medicines for irregular heart beat like propafenone, flecainide  -certain medicines for Parkinson's disease like amantadine, levodopa  -certain medicines for seizures like carbamazepine, phenytoin, phenobarbital  -cimetidine  -clopidogrel  -cyclophosphamide  -digoxin  -furazolidone  -isoniazid  -nicotine  -orphenadrine  -procarbazine  -steroid medicines like prednisone or cortisone  -stimulant  medicines for attention disorders, weight loss, or to stay awake  -tamoxifen  -theophylline  -thiotepa  -ticlopidine  -tramadol  -warfarin  This list may not describe all possible interactions. Give your health care provider a list of all the medicines, herbs, non-prescription drugs, or dietary supplements you use. Also tell them if you smoke, drink alcohol, or use illegal drugs. Some items may interact with your medicine.  What should I watch for while using this medicine?  Tell your doctor if your symptoms do not get better or if they get worse. Visit   use heavy machinery until you know how this medicine affects you. This medicine can impair your ability to perform these tasks. Do not take this medicine close to bedtime. It may prevent you from sleeping. Your mouth may get dry. Chewing sugarless gum or sucking hard candy, and drinking plenty of water may help. Contact your doctor if the problem does not go away or is severe. The tablet shell for some brands of this medicine does not dissolve. This is  normal. The tablet shell may appear whole in the stool. This is not a cause for concern. What side effects may I notice from receiving this medicine? Side effects that you should report to your doctor or health care professional as soon as possible: -allergic reactions like skin rash, itching or hives, swelling of the face, lips, or tongue -breathing problems -changes in vision -confusion -elevated mood, decreased need for sleep, racing thoughts, impulsive behavior -fast or irregular heartbeat -hallucinations, loss of contact with reality -increased blood pressure -redness, blistering, peeling or loosening of the skin, including inside the mouth -seizures -suicidal thoughts or other mood changes -unusually weak or tired -vomiting Side effects that usually do not require medical attention (report to your doctor or health care professional if they continue or are bothersome): -constipation -headache -loss of appetite -nausea -tremors -weight loss This list may not describe all possible side effects. Call your doctor for medical advice about side effects. You may report side effects to FDA at 1-800-FDA-1088. Where should I keep my medicine? Keep out of the reach of children. Store at room temperature between 15 and 30 degrees C (59 and 86 degrees F). Throw away any unused medicine after the expiration date. NOTE: This sheet is a summary. It may not cover all possible information. If you have questions about this medicine, talk to your doctor, pharmacist, or health care provider.  2019 Elsevier/Gold Standard (2016-04-22 13:55:13)

## 2019-01-07 ENCOUNTER — Ambulatory Visit (INDEPENDENT_AMBULATORY_CARE_PROVIDER_SITE_OTHER): Payer: Medicare Other | Admitting: Physician Assistant

## 2019-01-07 ENCOUNTER — Encounter (INDEPENDENT_AMBULATORY_CARE_PROVIDER_SITE_OTHER): Payer: Self-pay | Admitting: Physician Assistant

## 2019-01-07 ENCOUNTER — Other Ambulatory Visit (INDEPENDENT_AMBULATORY_CARE_PROVIDER_SITE_OTHER): Payer: Self-pay | Admitting: Physician Assistant

## 2019-01-07 VITALS — BP 121/77 | HR 99 | Temp 98.1°F | Ht 62.0 in | Wt 213.0 lb

## 2019-01-07 DIAGNOSIS — Z6839 Body mass index (BMI) 39.0-39.9, adult: Secondary | ICD-10-CM

## 2019-01-07 DIAGNOSIS — E119 Type 2 diabetes mellitus without complications: Secondary | ICD-10-CM | POA: Diagnosis not present

## 2019-01-07 DIAGNOSIS — I10 Essential (primary) hypertension: Secondary | ICD-10-CM

## 2019-01-07 DIAGNOSIS — E7849 Other hyperlipidemia: Secondary | ICD-10-CM

## 2019-01-07 MED ORDER — INSULIN PEN NEEDLE 32G X 4 MM MISC: 1.0000 [IU] | Freq: Two times a day (BID) | 0 refills | Status: DC

## 2019-01-07 NOTE — Progress Notes (Signed)
 Office: [phone]  /  Fax: [phone]   HPI:   Chief Complaint: OBESITY Sharon Chan is here to discuss her progress with her obesity treatment plan. She is on the Category 2 plan and is following her eating plan approximately 90 % of the time. She states she is walking and swimming 30-45 minutes 6 times per week. Sharon Chan has been under eating her protein. She is keeping a food journal every day.  Her weight is 213 lb (96.6 kg) today and gained 2 lbs since her last visit. She has lost 21 lbs since starting treatment with us.  Diabetes II Sharon Chan has a diagnosis of diabetes type II. Sharon Chan states fasting BGs range between 75 and 85. She is on Victoza and metformin.She denies nausea, vomiting, diarrhea or any hypoglycemic episodes. Last A1c was Hemoglobin A1C Latest Ref Rng & Units 12/24/2018 10/01/2018 08/10/2018 05/28/2018 02/06/2018  HGBA1C 4.8 - 5.6 % 5.8(H) 5.6 5.6 7.4(H) 6.2  Some recent data might be hidden    She has been working on intensive lifestyle modifications including diet, exercise, and weight loss to help control her blood glucose levels.  Hyperlipidemia Sharon Chan has hyperlipidemia and has been trying to improve her cholesterol levels with intensive lifestyle modification including a low saturated fat diet, exercise and weight loss. She is on Atorvastatin and denies any chest pain. Her last level was not at goal.  ASSESSMENT AND PLAN:  Type 2 diabetes mellitus without complication, without long-term current use of insulin (HCC) - Plan: Insulin Pen Needle (BD PEN NEEDLE NANO U/F) 32G X 4 MM MISC  Other hyperlipidemia  Class 2 severe obesity with serious comorbidity and body mass index (BMI) of 39.0 to 39.9 in adult, unspecified obesity type (HCC)  PLAN:  Diabetes II Sharon Chan has been given extensive diabetes education by myself today including ideal fasting and post-prandial blood glucose readings, individual ideal Hgb A1c goals  and hypoglycemia prevention. We discussed  the importance of good blood sugar control to decrease the likelihood of diabetic complications such as nephropathy, neuropathy, limb loss, blindness, coronary artery disease, and death. We discussed the importance of intensive lifestyle modification including diet, exercise and weight loss as the first line treatment for diabetes. We will refill Nano needles #100 with no refills. Sharon Chan agrees to continue taking her diabetes medications and will follow up with our clinic in 3 weeks.  Hyperlipidemia Sharon Chan was informed of the American Heart Association Guidelines emphasizing intensive lifestyle modifications as the first line treatment for hyperlipidemia. We discussed many lifestyle modifications today in depth, and [name] will continue to work on decreasing saturated fats such as fatty red meat, butter and many fried foods. She will also increase vegetables and lean protein in her diet and continue to work on exercise and weight loss efforts. Sharon Chan agrees to continue with her medication and weight loss efforts and follow up with our clinic in 3 weeks.  Obesity Sharon Chan is currently in the action stage of change. As such, her goal is to continue with weight loss efforts She has agreed to follow the Category 2 plan Sharon Chan has been instructed to work up to a goal of 150 minutes of combined cardio and strengthening exercise per week for weight loss and overall health benefits. We discussed the following Behavioral Modification Strategies today: increasing lean protein intake and work on meal planning and easy cooking plans  Sharon Chan has agreed to follow up with our clinic in 3 weeks. She was informed of the importance of frequent follow up visits   to maximize her success with intensive lifestyle modifications for her multiple health conditions.  ALLERGIES: Allergies  Allergen Reactions  . Penicillins Anaphylaxis    MEDICATIONS: Current Outpatient Medications on File Prior to Visit  Medication  Sig Dispense Refill  . acetaminophen (TYLENOL) 325 MG tablet Take 1-2 tablets (325-650 mg total) by mouth every 4 (four) hours as needed for mild pain.    . albuterol (PROVENTIL HFA;VENTOLIN HFA) 108 (90 Base) MCG/ACT inhaler Inhale 2 puffs into the lungs every 6 (six) hours as needed for wheezing or shortness of breath. 1 Inhaler 11  . atorvastatin (LIPITOR) 40 MG tablet Take 1 tablet (40 mg total) by mouth daily at 6 PM. 30 tablet 6  . blood glucose meter kit and supplies KIT Dispense based on patient and insurance preference. Use up to four times daily as directed. (FOR ICD-9 250.00, 250.01). 1 each 0  . buPROPion (WELLBUTRIN SR) 100 MG 12 hr tablet Take 1 tablet (100 mg total) by mouth 2 (two) times daily. 60 tablet 3  . cyclobenzaprine (FLEXERIL) 5 MG tablet TAKE 1 TABLET 3 TIMES A DAY AS NEEDED FOR MUSCLE SPASM SHOULDER/NECK PAIN 90 tablet 6  . diphenhydrAMINE (BENADRYL ALLERGY) 25 MG tablet Take 25 mg by mouth every 6 (six) hours as needed.    . DULoxetine (CYMBALTA) 30 MG capsule TAKE 1 CAPSULE BY MOUTH EVERYDAY AT BEDTIME 30 capsule 4  . gabapentin (NEURONTIN) 300 MG capsule TAKE 1 TABLET IN THE MORNING AND 2 TABLETS AT BEDTIME DAILY 90 capsule 3  . glucose blood (ONE TOUCH ULTRA TEST) test strip TEST TWICE A DAY 100 each 0  . liraglutide (VICTOZA) 18 MG/3ML SOPN Inject 0.2 mLs (1.2 mg total) into the skin every morning. 2 pen 0  . losartan (COZAAR) 25 MG tablet Take 1 tablet (25 mg total) by mouth daily. 30 tablet 0  . metFORMIN (GLUCOPHAGE) 500 MG tablet Take 1 tablet (500 mg total) by mouth 2 (two) times daily with a meal. 180 tablet 3  . ONETOUCH DELICA LANCETS 33G MISC TES 2 (TWO) TIMES DAILY. 100 each 0  . Vitamin D, Ergocalciferol, (DRISDOL) 1.25 MG (50000 UT) CAPS capsule Take 1 capsule (50,000 Units total) by mouth every 7 (seven) days. 4 capsule 0  . zolpidem (AMBIEN) 5 MG tablet TAKE 1 TABLET BY MOUTH EVERY DAY AT BEDTIME AS NEEDED FOR SLEEP 30 tablet 3  . topiramate (TOPAMAX)  100 MG tablet Take 1 tablet (100 mg total) by mouth 2 (two) times daily. Take 100mg in am and 200mg at night 90 tablet 3   No current facility-administered medications on file prior to visit.     PAST MEDICAL HISTORY: Past Medical History:  Diagnosis Date  . CVA (cerebral vascular accident) (HCC)   . Depression   . DM type 2 (diabetes mellitus, type 2) (HCC)   . HLD (hyperlipidemia)   . HTN (hypertension)   . Joint pain   . Migraines   . Neck pain   . Polycystic disease, ovaries   . Right sided weakness   . Seizures (HCC)   . Shoulder pain   . Sickle cell trait (HCC)   . Thyroid nodule     PAST SURGICAL HISTORY: Past Surgical History:  Procedure Laterality Date  . LAPAROSCOPIC CHOLECYSTECTOMY      SOCIAL HISTORY: Social History   Tobacco Use  . Smoking status: Never Smoker  . Smokeless tobacco: Never Used  Substance Use Topics  . Alcohol use: No  . Drug use:   No    FAMILY HISTORY: Family History  Problem Relation Age of Onset  . Heart attack Mother   . Diabetes Mother   . Hypertension Mother   . Hyperlipidemia Mother   . Post-traumatic stress disorder Father        committed suicide  . Diabetes Father   . Hypertension Sister   . Thyroid cancer Paternal Grandmother     ROS: Review of Systems  Constitutional: Negative for weight loss.  Cardiovascular: Negative for chest pain.  Gastrointestinal: Negative for diarrhea, nausea and vomiting.  Endo/Heme/Allergies:       Negative for hypoglycemia     PHYSICAL EXAM: Blood pressure 121/77, pulse 99, temperature 98.1 F (36.7 C), temperature source Oral, height 5' 2" (1.575 m), weight 213 lb (96.6 kg), last menstrual period 12/31/2018, SpO2 96 %. Body mass index is 38.96 kg/m. Physical Exam Vitals signs reviewed.  Constitutional:      Appearance: Normal appearance. She is obese.  Cardiovascular:     Rate and Rhythm: Normal rate.     Pulses: Normal pulses.  Pulmonary:     Effort: Pulmonary effort is  normal.  Musculoskeletal: Normal range of motion.  Skin:    General: Skin is warm and dry.  Neurological:     Mental Status: She is alert and oriented to person, place, and time.  Psychiatric:        Mood and Affect: Mood normal.        Behavior: Behavior normal.     RECENT LABS AND TESTS: BMET    Component Value Date/Time   NA 140 12/24/2018 1337   K 4.3 12/24/2018 1337   CL 109 (H) 12/24/2018 1337   CO2 18 (L) 12/24/2018 1337   GLUCOSE 85 12/24/2018 1337   GLUCOSE 101 (H) 09/12/2017 1121   BUN 17 12/24/2018 1337   CREATININE 1.18 (H) 12/24/2018 1337   CREATININE 0.92 09/12/2017 1121   CALCIUM 9.0 12/24/2018 1337   GFRNONAA 56 (L) 12/24/2018 1337   GFRNONAA 71 03/17/2017 1513   GFRAA 64 12/24/2018 1337   GFRAA 82 03/17/2017 1513   Lab Results  Component Value Date   HGBA1C 5.8 (H) 12/24/2018   HGBA1C 5.6 10/01/2018   HGBA1C 5.6 08/10/2018   HGBA1C 7.4 (H) 05/28/2018   HGBA1C 6.2 02/06/2018   Lab Results  Component Value Date   INSULIN 15.6 12/24/2018   INSULIN 13.7 09/11/2018   INSULIN 27.1 (H) 05/28/2018   CBC    Component Value Date/Time   WBC 9.3 05/28/2018 0918   WBC 5.3 09/12/2017 1121   RBC 5.50 (H) 05/28/2018 0918   RBC 4.90 09/12/2017 1121   HGB 13.5 05/28/2018 0918   HCT 41.5 05/28/2018 0918   PLT 308 09/12/2017 1121   MCV 76 (L) 05/28/2018 0918   MCH 24.5 (L) 05/28/2018 0918   MCH 25.1 (L) 09/12/2017 1121   MCHC 32.5 05/28/2018 0918   MCHC 32.6 09/12/2017 1121   RDW 18.2 (H) 05/28/2018 0918   LYMPHSABS 2.9 05/28/2018 0918   MONOABS 396 03/17/2017 1513   EOSABS 0.2 05/28/2018 0918   BASOSABS 0.0 05/28/2018 0918   Iron/TIBC/Ferritin/ %Sat    Component Value Date/Time   IRON 59 03/01/2017 2105   TIBC 291 03/01/2017 2105   FERRITIN 47 03/01/2017 2105   IRONPCTSAT 20 03/01/2017 2105   Lipid Panel     Component Value Date/Time   CHOL 117 12/24/2018 1337   TRIG 43 12/24/2018 1337   HDL 46 12/24/2018 1337   CHOLHDL 2.7   07/25/2017 1130    VLDL 27 02/26/2017 0341   LDLCALC 62 12/24/2018 1337   LDLCALC 70 07/25/2017 1130   Hepatic Function Panel     Component Value Date/Time   PROT 6.1 12/24/2018 1337   ALBUMIN 3.9 12/24/2018 1337   AST 12 12/24/2018 1337   ALT 16 12/24/2018 1337   ALKPHOS 94 12/24/2018 1337   BILITOT 0.3 12/24/2018 1337      Component Value Date/Time   TSH 0.57 12/03/2018 1416   TSH 1.060 05/28/2018 0918   TSH 0.76 11/30/2017 0954      OBESITY BEHAVIORAL INTERVENTION VISIT  Today's visit was # 15  Starting weight: 234 lbs Starting date: 05/28/2018 Today's weight :: 213 lbs Today's date: 01/07/2019 Total lbs lost to date: 21    01/07/2019  BP 121/77  Temp 98.1 F (36.7 C)  Pulse 99  SpO2 96 %  Weight 213 lb  Height 5' 2" (1.575 m)  BMI (Calculated) 38.95   At least 15 minutes were spent on discussing the following behavioral intervention visit.   ASK: We discussed the diagnosis of obesity with Sharon Chan today and Sharon Chan agreed to give [name] permission to discuss obesity behavioral modification therapy today.  ASSESS: Sharon Chan has the diagnosis of obesity and her BMI today is 38.95 Sharon Chan is in the action stage of change   ADVISE: Sharon Chan was educated on the multiple health risks of obesity as well as the benefit of weight loss to improve her health. She was advised of the need for long term treatment and the importance of lifestyle modifications to improve her current health and to decrease her risk of future health problems.  AGREE: Multiple dietary modification options and treatment options were discussed and  Sharon Chan agreed to follow the recommendations documented in the above note.  ARRANGE: Sharon Chan was educated on the importance of frequent visits to treat obesity as outlined per CMS and USPSTF guidelines and agreed to schedule her next follow up appointment today.   I, Tammy Wysor am acting as Location manager for [employer] I, Abby Potash, PA-C  have reviewed above note and agree with its content

## 2019-01-24 ENCOUNTER — Encounter (INDEPENDENT_AMBULATORY_CARE_PROVIDER_SITE_OTHER): Payer: Self-pay

## 2019-01-28 ENCOUNTER — Encounter (INDEPENDENT_AMBULATORY_CARE_PROVIDER_SITE_OTHER): Payer: Self-pay

## 2019-01-28 ENCOUNTER — Other Ambulatory Visit: Payer: Self-pay | Admitting: Family Medicine

## 2019-01-28 ENCOUNTER — Ambulatory Visit (INDEPENDENT_AMBULATORY_CARE_PROVIDER_SITE_OTHER): Payer: Self-pay | Admitting: Physician Assistant

## 2019-01-28 DIAGNOSIS — F419 Anxiety disorder, unspecified: Secondary | ICD-10-CM

## 2019-01-29 ENCOUNTER — Other Ambulatory Visit: Payer: Self-pay | Admitting: Neurology

## 2019-01-29 DIAGNOSIS — R51 Headache: Principal | ICD-10-CM

## 2019-01-29 DIAGNOSIS — R519 Headache, unspecified: Secondary | ICD-10-CM

## 2019-02-07 ENCOUNTER — Encounter (INDEPENDENT_AMBULATORY_CARE_PROVIDER_SITE_OTHER): Payer: Self-pay

## 2019-02-08 ENCOUNTER — Ambulatory Visit: Payer: Medicare Other | Admitting: Family Medicine

## 2019-02-09 ENCOUNTER — Other Ambulatory Visit (INDEPENDENT_AMBULATORY_CARE_PROVIDER_SITE_OTHER): Payer: Self-pay | Admitting: Physician Assistant

## 2019-02-09 DIAGNOSIS — E559 Vitamin D deficiency, unspecified: Secondary | ICD-10-CM

## 2019-02-09 DIAGNOSIS — I10 Essential (primary) hypertension: Secondary | ICD-10-CM

## 2019-02-09 DIAGNOSIS — E119 Type 2 diabetes mellitus without complications: Secondary | ICD-10-CM

## 2019-02-11 ENCOUNTER — Other Ambulatory Visit: Payer: Self-pay | Admitting: Family Medicine

## 2019-02-12 ENCOUNTER — Encounter: Payer: Self-pay | Admitting: Family Medicine

## 2019-02-23 ENCOUNTER — Other Ambulatory Visit (INDEPENDENT_AMBULATORY_CARE_PROVIDER_SITE_OTHER): Payer: Self-pay | Admitting: Physician Assistant

## 2019-02-23 DIAGNOSIS — I10 Essential (primary) hypertension: Secondary | ICD-10-CM

## 2019-02-23 DIAGNOSIS — E559 Vitamin D deficiency, unspecified: Secondary | ICD-10-CM

## 2019-02-23 DIAGNOSIS — E119 Type 2 diabetes mellitus without complications: Secondary | ICD-10-CM

## 2019-02-24 ENCOUNTER — Other Ambulatory Visit: Payer: Self-pay | Admitting: Family Medicine

## 2019-02-24 ENCOUNTER — Other Ambulatory Visit: Payer: Self-pay | Admitting: Neurology

## 2019-02-24 DIAGNOSIS — R51 Headache: Principal | ICD-10-CM

## 2019-02-24 DIAGNOSIS — R519 Headache, unspecified: Secondary | ICD-10-CM

## 2019-02-24 DIAGNOSIS — F419 Anxiety disorder, unspecified: Secondary | ICD-10-CM

## 2019-04-03 ENCOUNTER — Other Ambulatory Visit (INDEPENDENT_AMBULATORY_CARE_PROVIDER_SITE_OTHER): Payer: Self-pay | Admitting: Physician Assistant

## 2019-04-03 DIAGNOSIS — E559 Vitamin D deficiency, unspecified: Secondary | ICD-10-CM

## 2019-04-04 ENCOUNTER — Encounter (INDEPENDENT_AMBULATORY_CARE_PROVIDER_SITE_OTHER): Payer: Self-pay | Admitting: Physician Assistant

## 2019-04-04 ENCOUNTER — Encounter: Payer: Self-pay | Admitting: Family Medicine

## 2019-04-05 ENCOUNTER — Telehealth: Payer: Self-pay

## 2019-04-05 ENCOUNTER — Other Ambulatory Visit: Payer: Self-pay

## 2019-04-05 DIAGNOSIS — E781 Pure hyperglyceridemia: Secondary | ICD-10-CM

## 2019-04-05 DIAGNOSIS — I10 Essential (primary) hypertension: Secondary | ICD-10-CM

## 2019-04-05 DIAGNOSIS — I69351 Hemiplegia and hemiparesis following cerebral infarction affecting right dominant side: Secondary | ICD-10-CM

## 2019-04-05 DIAGNOSIS — I6381 Other cerebral infarction due to occlusion or stenosis of small artery: Secondary | ICD-10-CM

## 2019-04-05 DIAGNOSIS — E119 Type 2 diabetes mellitus without complications: Secondary | ICD-10-CM

## 2019-04-05 MED ORDER — GLUCOSE BLOOD VI STRP: ORAL_STRIP | 0 refills | Status: DC

## 2019-04-05 MED ORDER — TOPIRAMATE 100 MG PO TABS: 100.0000 mg | ORAL_TABLET | Freq: Two times a day (BID) | ORAL | 3 refills | Status: DC

## 2019-04-05 MED ORDER — ATORVASTATIN CALCIUM 40 MG PO TABS: 40.0000 mg | ORAL_TABLET | Freq: Every day | ORAL | 6 refills | Status: DC

## 2019-04-05 MED ORDER — ONETOUCH DELICA LANCETS 33G MISC: 5 refills | Status: AC

## 2019-04-05 MED ORDER — LOSARTAN POTASSIUM 25 MG PO TABS: 25.0000 mg | ORAL_TABLET | Freq: Every day | ORAL | 5 refills | Status: DC

## 2019-04-05 NOTE — Telephone Encounter (Signed)
Patient requesting a refill on Cyclobenzaprine, Gabapentin, and Ambien.

## 2019-04-05 NOTE — Progress Notes (Unsigned)
Medication has been sent to pharmacy.  °

## 2019-04-08 ENCOUNTER — Other Ambulatory Visit: Payer: Self-pay | Admitting: Family Medicine

## 2019-04-08 DIAGNOSIS — I6930 Unspecified sequelae of cerebral infarction: Secondary | ICD-10-CM

## 2019-04-08 DIAGNOSIS — G4700 Insomnia, unspecified: Secondary | ICD-10-CM

## 2019-04-08 MED ORDER — GABAPENTIN 300 MG PO CAPS: ORAL_CAPSULE | ORAL | 3 refills | Status: DC

## 2019-04-08 MED ORDER — CYCLOBENZAPRINE HCL 5 MG PO TABS: ORAL_TABLET | ORAL | 6 refills | Status: DC

## 2019-04-08 MED ORDER — ZOLPIDEM TARTRATE 5 MG PO TABS: ORAL_TABLET | ORAL | 3 refills | Status: DC

## 2019-04-09 NOTE — Telephone Encounter (Signed)
Patient notified through my chart.

## 2019-05-01 ENCOUNTER — Other Ambulatory Visit: Payer: Self-pay | Admitting: Family Medicine

## 2019-05-01 DIAGNOSIS — I6930 Unspecified sequelae of cerebral infarction: Secondary | ICD-10-CM

## 2019-05-31 ENCOUNTER — Telehealth: Payer: Self-pay

## 2019-05-31 ENCOUNTER — Other Ambulatory Visit: Payer: Self-pay | Admitting: Family Medicine

## 2019-05-31 DIAGNOSIS — F419 Anxiety disorder, unspecified: Secondary | ICD-10-CM

## 2019-05-31 DIAGNOSIS — I6930 Unspecified sequelae of cerebral infarction: Secondary | ICD-10-CM

## 2019-05-31 MED ORDER — GABAPENTIN 300 MG PO CAPS: ORAL_CAPSULE | ORAL | 0 refills | Status: DC

## 2019-05-31 NOTE — Telephone Encounter (Signed)
Medication sent to pharmacy  

## 2019-05-31 NOTE — Telephone Encounter (Signed)
Patient requesting a refill on Gabapentin. Patient was last seen in 12/2018.

## 2019-05-31 NOTE — Addendum Note (Signed)
Addended by: Jefferson Fuel on: 05/31/2019 04:06 PM   Modules accepted: Orders

## 2019-06-06 ENCOUNTER — Other Ambulatory Visit: Payer: Self-pay | Admitting: Family Medicine

## 2019-06-06 DIAGNOSIS — F419 Anxiety disorder, unspecified: Secondary | ICD-10-CM

## 2019-06-28 ENCOUNTER — Other Ambulatory Visit: Payer: Self-pay | Admitting: Family Medicine

## 2019-06-28 DIAGNOSIS — I6381 Other cerebral infarction due to occlusion or stenosis of small artery: Secondary | ICD-10-CM

## 2019-06-28 DIAGNOSIS — I69351 Hemiplegia and hemiparesis following cerebral infarction affecting right dominant side: Secondary | ICD-10-CM

## 2019-07-15 ENCOUNTER — Telehealth: Payer: Self-pay | Admitting: Family Medicine

## 2019-07-15 NOTE — Telephone Encounter (Signed)
error 

## 2019-07-19 ENCOUNTER — Ambulatory Visit (INDEPENDENT_AMBULATORY_CARE_PROVIDER_SITE_OTHER): Payer: Medicare Other | Admitting: Family Medicine

## 2019-07-19 ENCOUNTER — Encounter: Payer: Self-pay | Admitting: Family Medicine

## 2019-07-19 ENCOUNTER — Other Ambulatory Visit: Payer: Self-pay

## 2019-07-19 VITALS — BP 142/89 | HR 90 | Temp 98.2°F | Ht 63.0 in | Wt 236.9 lb

## 2019-07-19 DIAGNOSIS — R609 Edema, unspecified: Secondary | ICD-10-CM | POA: Diagnosis not present

## 2019-07-19 DIAGNOSIS — G6289 Other specified polyneuropathies: Secondary | ICD-10-CM | POA: Diagnosis not present

## 2019-07-19 DIAGNOSIS — G4700 Insomnia, unspecified: Secondary | ICD-10-CM

## 2019-07-19 DIAGNOSIS — G629 Polyneuropathy, unspecified: Secondary | ICD-10-CM | POA: Insufficient documentation

## 2019-07-19 DIAGNOSIS — E669 Obesity, unspecified: Secondary | ICD-10-CM | POA: Insufficient documentation

## 2019-07-19 DIAGNOSIS — F5104 Psychophysiologic insomnia: Secondary | ICD-10-CM | POA: Insufficient documentation

## 2019-07-19 DIAGNOSIS — E119 Type 2 diabetes mellitus without complications: Secondary | ICD-10-CM

## 2019-07-19 DIAGNOSIS — Z09 Encounter for follow-up examination after completed treatment for conditions other than malignant neoplasm: Secondary | ICD-10-CM | POA: Diagnosis not present

## 2019-07-19 DIAGNOSIS — Z6841 Body Mass Index (BMI) 40.0 and over, adult: Secondary | ICD-10-CM | POA: Diagnosis not present

## 2019-07-19 DIAGNOSIS — F419 Anxiety disorder, unspecified: Secondary | ICD-10-CM | POA: Insufficient documentation

## 2019-07-19 DIAGNOSIS — E66813 Obesity, class 3: Secondary | ICD-10-CM

## 2019-07-19 LAB — POCT URINALYSIS DIPSTICK
Bilirubin, UA: NEGATIVE
Blood, UA: NEGATIVE
Glucose, UA: NEGATIVE
Ketones, UA: NEGATIVE
Leukocytes, UA: NEGATIVE
Nitrite, UA: NEGATIVE
Protein, UA: NEGATIVE
Spec Grav, UA: >=1.03 — AB (ref 1.010–1.025)
Urobilinogen, UA: 0.2 E.U./dL
pH, UA: 6 (ref 5.0–8.0)

## 2019-07-19 LAB — POCT GLYCOSYLATED HEMOGLOBIN (HGB A1C)
HbA1c POC (<> result, manual entry): 6.2 % (ref 4.0–5.6)
HbA1c, POC (controlled diabetic range): 6.2 % (ref 0.0–7.0)
HbA1c, POC (prediabetic range): 6.2 % (ref 5.7–6.4)
Hemoglobin A1C: 6.2 % — AB (ref 4.0–5.6)

## 2019-07-19 LAB — GLUCOSE, POCT (MANUAL RESULT ENTRY): POC Glucose: 101 mg/dl — AB (ref 70–99)

## 2019-07-19 MED ORDER — VICTOZA 18 MG/3ML ~~LOC~~ SOPN: 1.2000 mg | PEN_INJECTOR | SUBCUTANEOUS | 6 refills | Status: DC

## 2019-07-19 MED ORDER — BUPROPION HCL ER (SR) 200 MG PO TB12: 200.0000 mg | ORAL_TABLET | Freq: Two times a day (BID) | ORAL | 3 refills | Status: DC

## 2019-07-19 MED ORDER — GABAPENTIN 600 MG PO TABS: 600.0000 mg | ORAL_TABLET | Freq: Three times a day (TID) | ORAL | 3 refills | Status: DC

## 2019-07-19 MED ORDER — ZOLPIDEM TARTRATE 5 MG PO TABS: ORAL_TABLET | ORAL | 3 refills | Status: DC

## 2019-07-19 MED ORDER — FUROSEMIDE 20 MG PO TABS: 20.0000 mg | ORAL_TABLET | Freq: Every day | ORAL | 3 refills | Status: DC

## 2019-07-19 NOTE — Progress Notes (Signed)
Patient [facility]   Established Patient Office Visit  Subjective:  Patient ID: Sharon Chan, female    DOB: [birth year]  Age: 43 y.o. MRN: [id number]  CC:  Chief Complaint  Patient presents with  . knee pain    leg and knee pain right and left     HPI Sharon Chan is a 43 year old female who presents for Follow Up today.   Past Medical History:  Diagnosis Date  . CVA (cerebral vascular accident) ([place])   . Depression   . DM type 2 (diabetes mellitus, type 2) ([place])   . HLD (hyperlipidemia)   . HTN (hypertension)   . Joint pain   . Migraines   . Neck pain   . Polycystic disease, ovaries   . Right sided weakness   . Seizures ([place])   . Shoulder pain   . Sickle cell trait ([place])   . Thyroid nodule    Current Status: Since her last office visit, she is doing well with no complaints. She had been having bilateral leg pain for the past 8 months. Her anxiety is moderate today, r/t weight gain. Her most recent normal range of preprandial blood glucose levels have been between 95-105. She has seen low range of 70 and high of 105 since his last office visit. She denies fatigue, frequent urination, blurred vision, excessive hunger, excessive thirst, weight gain, weight loss, and poor wound healing. She continues to check her feet regularly. She denies visual changes, chest pain, cough, shortness of breath, heart palpitations, and falls. She has occasional headaches and dizziness with position changes. Denies severe headaches, confusion, seizures, double vision, and blurred vision, nausea and vomiting.  She denies fevers, chills, recent infections, weight loss, and night sweats. No reports of GI problems such as nausea, vomiting, diarrhea, and constipation. She has no reports of blood in stools, dysuria and hematuria. No depression or anxiety, and denies suicidal ideations, homicidal ideations, or auditory hallucinations. She denies pain  today.   Past Surgical History:  Procedure Laterality Date  . LAPAROSCOPIC CHOLECYSTECTOMY      Family History  Problem Relation Age of Onset  . Heart attack Mother   . Diabetes Mother   . Hypertension Mother   . Hyperlipidemia Mother   . Post-traumatic stress disorder Father        committed suicide  . Diabetes Father   . Hypertension Sister   . Thyroid cancer Paternal Grandmother     Social History   Socioeconomic History  . Marital status: Single    Spouse name: Not on file  . Number of children: 0  . Years of education: Not on file  . Highest education level: Not on file  Occupational History  . Not on file  Social Needs  . Financial resource strain: Not on file  . Food insecurity    Worry: Not on file    Inability: Not on file  . Transportation needs    Medical: Not on file    Non-medical: Not on file  Tobacco Use  . Smoking status: Never Smoker  . Smokeless tobacco: Never Used  Substance and Sexual Activity  . Alcohol use: No  . Drug use: No  . Sexual activity: Not on file  Lifestyle  . Physical activity    Days per week: Not on file    Minutes per session: Not on file  . Stress: Not on file  Relationships  . Social connections  Talks on phone: Not on file    Gets together: Not on file    Attends religious service: Not on file    Active member of club or organization: Not on file    Attends meetings of clubs or organizations: Not on file    Relationship status: Not on file  . Intimate partner violence    Fear of current or ex partner: Not on file    Emotionally abused: Not on file    Physically abused: Not on file    Forced sexual activity: Not on file  Other Topics Concern  . Not on file  Social History Narrative   Lives at home, mom currently staying with her   Left-handed   Caffeine: occasional decaf coffee or raspberry tea    Outpatient Medications Prior to Visit  Medication Sig Dispense Refill  . acetaminophen (TYLENOL) 325 MG  tablet Take 1-2 tablets (325-650 mg total) by mouth every 4 (four) hours as needed for mild pain.    Marland Kitchen albuterol (PROVENTIL HFA;VENTOLIN HFA) 108 (90 Base) MCG/ACT inhaler Inhale 2 puffs into the lungs every 6 (six) hours as needed for wheezing or shortness of breath. 1 Inhaler 11  . atorvastatin (LIPITOR) 40 MG tablet Take 1 tablet (40 mg total) by mouth daily at 6 PM. 30 tablet 6  . blood glucose meter kit and supplies KIT Dispense based on patient and insurance preference. Use up to four times daily as directed. (FOR ICD-9 250.00, 250.01). 1 each 0  . cyclobenzaprine (FLEXERIL) 5 MG tablet TAKE 1 TABLET 3 TIMES A DAY AS NEEDED FOR MUSCLE SPASM SHOULDER/NECK PAIN 90 tablet 6  . diphenhydrAMINE (BENADRYL ALLERGY) 25 MG tablet Take 25 mg by mouth every 6 (six) hours as needed.    . DULoxetine (CYMBALTA) 30 MG capsule TAKE 1 CAPSULE BY MOUTH EVERYDAY AT BEDTIME 90 capsule 2  . glucose blood (ONE TOUCH ULTRA TEST) test strip TEST TWICE A DAY 100 each 0  . Insulin Pen Needle (BD PEN NEEDLE NANO U/F) 32G X 4 MM MISC Inject 1 Units into the skin 2 (two) times daily. 100 each 0  . losartan (COZAAR) 25 MG tablet Take 1 tablet (25 mg total) by mouth daily. 30 tablet 5  . metFORMIN (GLUCOPHAGE) 500 MG tablet Take 1 tablet (500 mg total) by mouth 2 (two) times daily with a meal. 180 tablet 3  . OneTouch Delica Lancets 75Z MISC TES 2 (TWO) TIMES DAILY. 100 each 5  . topiramate (TOPAMAX) 100 MG tablet TAKE 1 TABLET IN MORNING AND 2 TABLETS AT NIGHT 20 tablet 0  . Vitamin D, Ergocalciferol, (DRISDOL) 1.25 MG (50000 UT) CAPS capsule Take 1 capsule (50,000 Units total) by mouth every 7 (seven) days. 4 capsule 0  . buPROPion (WELLBUTRIN SR) 100 MG 12 hr tablet Take 1 tablet (100 mg total) by mouth 2 (two) times daily. 60 tablet 3  . gabapentin (NEURONTIN) 300 MG capsule TAKE 1 TABLET IN THE MORNING AND 2 TABLETS AT BEDTIME DAILY 90 capsule 0  . liraglutide (VICTOZA) 18 MG/3ML SOPN Inject 0.2 mLs (1.2 mg total) into  the skin every morning. 2 pen 0  . zolpidem (AMBIEN) 5 MG tablet TAKE 1 TABLET BY MOUTH EVERY DAY AT BEDTIME AS NEEDED FOR SLEEP 30 tablet 3   No facility-administered medications prior to visit.     Allergies  Allergen Reactions  . Penicillins Anaphylaxis    ROS Review of Systems  Constitutional: Positive for fatigue.  HENT: Negative.  Eyes: Negative.   Respiratory: Negative.   Cardiovascular: Negative.   Gastrointestinal: Negative.   Endocrine: Negative.   Genitourinary: Negative.   Musculoskeletal: Negative.   Skin: Negative.   Allergic/Immunologic: Negative.   Neurological: Positive for dizziness (occasional ), weakness (generalized weakness) and headaches (occasional ).  Hematological: Negative.   Psychiatric/Behavioral: Negative.       Objective:    Physical Exam  Constitutional: She is oriented to person, place, and time. She appears well-developed and well-nourished.  HENT:  Head: Normocephalic and atraumatic.  Eyes: Conjunctivae are normal.  Neck: Normal range of motion. Neck supple.  Cardiovascular: Normal rate, regular rhythm and intact distal pulses.  Pulmonary/Chest: Effort normal and breath sounds normal.  Abdominal: Soft. Bowel sounds are normal. She exhibits distension (obese).  Musculoskeletal: Normal range of motion.  Neurological: She is alert and oriented to person, place, and time. She has normal reflexes.  Skin: Skin is warm and dry.  Psychiatric: She has a normal mood and affect. Her behavior is normal. Judgment and thought content normal.  Nursing note and vitals reviewed.  BP (!) 142/89 (BP Location: Left Arm, Patient Position: Sitting, Cuff Size: Large)   Pulse 90   Temp 98.2 F (36.8 C) (Oral)   Ht 5' 3"  (1.6 m)   Wt 236 lb 14.4 oz (107.5 kg)   BMI 41.96 kg/m  Wt Readings from Last 3 Encounters:  07/19/19 236 lb 14.4 oz (107.5 kg)  01/07/19 213 lb (96.6 kg)  01/01/19 217 lb (98.4 kg)   Health Maintenance Due  Topic Date Due  .  PNEUMOCOCCAL POLYSACCHARIDE VACCINE AGE 4-64 HIGH RISK  11/15/1975  . OPHTHALMOLOGY EXAM  11/15/1983  . PAP SMEAR-Modifier  11/14/1994  . INFLUENZA VACCINE  06/15/2019  . HEMOGLOBIN A1C  06/24/2019    There are no preventive care reminders to display for this patient.  Lab Results  Component Value Date   TSH 0.57 12/03/2018   Lab Results  Component Value Date   WBC 9.3 05/28/2018   HGB 13.5 05/28/2018   HCT 41.5 05/28/2018   MCV 76 (L) 05/28/2018   PLT 308 09/12/2017   Lab Results  Component Value Date   NA 140 12/24/2018   K 4.3 12/24/2018   CO2 18 (L) 12/24/2018   GLUCOSE 85 12/24/2018   BUN 17 12/24/2018   CREATININE 1.18 (H) 12/24/2018   BILITOT 0.3 12/24/2018   ALKPHOS 94 12/24/2018   AST 12 12/24/2018   ALT 16 12/24/2018   PROT 6.1 12/24/2018   ALBUMIN 3.9 12/24/2018   CALCIUM 9.0 12/24/2018   ANIONGAP 8 03/08/2017   Lab Results  Component Value Date   CHOL 117 12/24/2018   Lab Results  Component Value Date   HDL 46 12/24/2018   Lab Results  Component Value Date   LDLCALC 62 12/24/2018   Lab Results  Component Value Date   TRIG 43 12/24/2018   Lab Results  Component Value Date   CHOLHDL 2.7 07/25/2017   Lab Results  Component Value Date   HGBA1C 6.2 (A) 07/19/2019   HGBA1C 6.2 07/19/2019   HGBA1C 6.2 07/19/2019   HGBA1C 6.2 07/19/2019      Assessment & Plan:   1. Type 2 diabetes mellitus without complication, without long-term current use of insulin (HCC) Stable. Hgb A1c is 6.2 today. She will continue to decrease foods/beverages high in sugars and carbs and follow Heart Healthy or DASH diet. Increase physical activity to at least 30 minutes cardio exercise daily.  - POCT  Urinalysis Dipstick - POCT glycosylated hemoglobin (Hb A1C) - Glucose (CBG) - liraglutide (VICTOZA) 18 MG/3ML SOPN; Inject 0.2 mLs (1.2 mg total) into the skin every morning.  Dispense: 2 pen; Refill: 6  2. Other polyneuropathy We will increase dosage of Gabapentin  today.  - gabapentin (NEURONTIN) 600 MG tablet; Take 1 tablet (600 mg total) by mouth 3 (three) times daily.  Dispense: 90 tablet; Refill: 3  3. Insomnia, unspecified type - zolpidem (AMBIEN) 5 MG tablet; TAKE 1 TABLET BY MOUTH EVERY DAY AT BEDTIME AS NEEDED FOR SLEEP  Dispense: 30 tablet; Refill: 3  4. Edema, unspecified type We will initiate Furosemide today.  - furosemide (LASIX) 20 MG tablet; Take 1 tablet (20 mg total) by mouth daily.  Dispense: 30 tablet; Refill: 3  5. Anxiety We will increase dosage of Bupropion today.  - buPROPion (WELLBUTRIN SR) 200 MG 12 hr tablet; Take 1 tablet (200 mg total) by mouth 2 (two) times daily.  Dispense: 60 tablet; Refill: 3  6. Class 3 severe obesity due to excess calories with serious comorbidity and body mass index (BMI) of 40.0 to 44.9 in adult [facility]) Body mass index is 41.96 kg/m. Goal BMI  is <30. Encouraged efforts to reduce weight include engaging in physical activity as tolerated with goal of 150 minutes per week. Improve dietary choices and eat a meal regimen consistent with a Mediterranean or DASH diet. Reduce simple carbohydrates. Do not skip meals and eat healthy snacks throughout the day to avoid over-eating at dinner. Set a goal weight loss that is achievable for you. - buPROPion (WELLBUTRIN SR) 200 MG 12 hr tablet; Take 1 tablet (200 mg total) by mouth 2 (two) times daily.  Dispense: 60 tablet; Refill: 3  7. Follow up She will follow up in 3 months.   Meds ordered this encounter  Medications  . gabapentin (NEURONTIN) 600 MG tablet    Sig: Take 1 tablet (600 mg total) by mouth 3 (three) times daily.    Dispense:  90 tablet    Refill:  3  . zolpidem (AMBIEN) 5 MG tablet    Sig: TAKE 1 TABLET BY MOUTH EVERY DAY AT BEDTIME AS NEEDED FOR SLEEP    Dispense:  30 tablet    Refill:  3    Order Specific Question:   Supervising Provider    Answer:   Tresa Garter [id number]  . liraglutide (VICTOZA) 18 MG/3ML SOPN    Sig: Inject 0.2  mLs (1.2 mg total) into the skin every morning.    Dispense:  2 pen    Refill:  6  . furosemide (LASIX) 20 MG tablet    Sig: Take 1 tablet (20 mg total) by mouth daily.    Dispense:  30 tablet    Refill:  3  . buPROPion (WELLBUTRIN SR) 200 MG 12 hr tablet    Sig: Take 1 tablet (200 mg total) by mouth 2 (two) times daily.    Dispense:  60 tablet    Refill:  3   Orders Placed This Encounter  Procedures  . POCT Urinalysis Dipstick  . POCT glycosylated hemoglobin (Hb A1C)  . Glucose (CBG)    Referral Orders  No referral(s) requested today    Kathe Becton,  MSN, FNP-BC [facility] [address] [place], [place] [address] [phone] fax    Problem List Items Addressed This Visit      Endocrine   Type 2  diabetes mellitus without complication, without long-term current use of insulin (HCC) - Primary   Relevant Medications   liraglutide (VICTOZA) 18 MG/3ML SOPN   Other Relevant Orders   POCT Urinalysis Dipstick (Completed)   POCT glycosylated hemoglobin (Hb A1C) (Completed)   Glucose (CBG) (Completed)    Other Visit Diagnoses    Other polyneuropathy       Relevant Medications   gabapentin (NEURONTIN) 600 MG tablet   zolpidem (AMBIEN) 5 MG tablet   buPROPion (WELLBUTRIN SR) 200 MG 12 hr tablet   Insomnia, unspecified type       Relevant Medications   zolpidem (AMBIEN) 5 MG tablet   Edema, unspecified type       Relevant Medications   furosemide (LASIX) 20 MG tablet   Anxiety       Relevant Medications   buPROPion (WELLBUTRIN SR) 200 MG 12 hr tablet   Class 3 severe obesity due to excess calories with serious comorbidity and body mass index (BMI) of 40.0 to 44.9 in adult [facility]       Relevant Medications   liraglutide (VICTOZA) 18 MG/3ML SOPN   buPROPion (WELLBUTRIN SR) 200 MG 12 hr tablet   Follow up          Meds ordered this encounter  Medications  . gabapentin (NEURONTIN)  600 MG tablet    Sig: Take 1 tablet (600 mg total) by mouth 3 (three) times daily.    Dispense:  90 tablet    Refill:  3  . zolpidem (AMBIEN) 5 MG tablet    Sig: TAKE 1 TABLET BY MOUTH EVERY DAY AT BEDTIME AS NEEDED FOR SLEEP    Dispense:  30 tablet    Refill:  3    Order Specific Question:   Supervising Provider    Answer:   Tresa Garter [id number]  . liraglutide (VICTOZA) 18 MG/3ML SOPN    Sig: Inject 0.2 mLs (1.2 mg total) into the skin every morning.    Dispense:  2 pen    Refill:  6  . furosemide (LASIX) 20 MG tablet    Sig: Take 1 tablet (20 mg total) by mouth daily.    Dispense:  30 tablet    Refill:  3  . buPROPion (WELLBUTRIN SR) 200 MG 12 hr tablet    Sig: Take 1 tablet (200 mg total) by mouth 2 (two) times daily.    Dispense:  60 tablet    Refill:  3    Follow-up: No follow-ups on file.    Azzie Glatter, FNP

## 2019-07-19 NOTE — Patient Instructions (Signed)
Edema  Edema is when you have too much fluid in your body or under your skin. Edema may make your legs, feet, and ankles swell up. Swelling is also common in looser tissues, like around your eyes. This is a common condition. It gets more common as you get older. There are many possible causes of edema. Eating too much salt (sodium) and being on your feet or sitting for a long time can cause edema in your legs, feet, and ankles. Hot weather may make edema worse. Edema is usually painless. Your skin may look swollen or shiny. Follow these instructions at home:  Keep the swollen body part raised (elevated) above the level of your heart when you are sitting or lying down.  Do not sit still or stand for a long time.  Do not wear tight clothes. Do not wear garters on your upper legs.  Exercise your legs. This can help the swelling go down.  Wear elastic bandages or support stockings as told by your doctor.  Eat a low-salt (low-sodium) diet to reduce fluid as told by your doctor.  Depending on the cause of your swelling, you may need to limit how much fluid you drink (fluid restriction).  Take over-the-counter and prescription medicines only as told by your doctor. Contact a doctor if:  Treatment is not working.  You have heart, liver, or kidney disease and have symptoms of edema.  You have sudden and unexplained weight gain. Get help right away if:  You have shortness of breath or chest pain.  You cannot breathe when you lie down.  You have pain, redness, or warmth in the swollen areas.  You have heart, liver, or kidney disease and get edema all of a sudden.  You have a fever and your symptoms get worse all of a sudden. Summary  Edema is when you have too much fluid in your body or under your skin.  Edema may make your legs, feet, and ankles swell up. Swelling is also common in looser tissues, like around your eyes.  Raise (elevate) the swollen body part above the level of your  heart when you are sitting or lying down.  Follow your doctor's instructions about diet and how much fluid you can drink (fluid restriction). This information is not intended to replace advice given to you by your health care provider. Make sure you discuss any questions you have with your health care provider. Document Released: 04/18/2008 Document Revised: 11/03/2017 Document Reviewed: 11/18/2016 Elsevier Patient Education  2020 [place]. Furosemide tablets What is this medicine? FUROSEMIDE (fyoor [place] se mide) is a diuretic. It helps you make more urine and to lose salt and excess water from your body. This medicine is used to treat high blood pressure, and edema or swelling from heart, kidney, or liver disease. This medicine may be used for other purposes; ask your health care provider or pharmacist if you have questions. COMMON BRAND NAME(S): Active-Medicated Specimen Kit, Delone, Diuscreen, Lasix, RX Specimen Collection Kit, Specimen Collection Kit, URINX Medicated Specimen Collection What should I tell my health care provider before I take this medicine? They need to know if you have any of these conditions:  abnormal blood electrolytes  diarrhea or vomiting  gout  heart disease  kidney disease, small amounts of urine, or difficulty passing urine  liver disease  thyroid disease  an unusual or allergic reaction to furosemide, sulfa drugs, other medicines, foods, dyes, or preservatives  pregnant or trying to get pregnant  breast-feeding  How should I use this medicine? Take this medicine by mouth with a glass of water. Follow the directions on the prescription label. You may take this medicine with or without food. If it upsets your stomach, take it with food or milk. Do not take your medicine more often than directed. Remember that you will need to pass more urine after taking this medicine. Do not take your medicine at a time of day that will cause you problems. Do not take  at bedtime. Talk to your pediatrician regarding the use of this medicine in children. While this drug may be prescribed for selected conditions, precautions do apply. Overdosage: If you think you have taken too much of this medicine contact a poison control center or emergency room at once. NOTE: This medicine is only for you. Do not share this medicine with others. What if I miss a dose? If you miss a dose, take it as soon as you can. If it is almost time for your next dose, take only that dose. Do not take double or extra doses. What may interact with this medicine?  aspirin and aspirin-like medicines  certain antibiotics  chloral hydrate  cisplatin  cyclosporine  digoxin  diuretics  laxatives  lithium  medicines for blood pressure  medicines that relax muscles for surgery  methotrexate  NSAIDs, medicines for pain and inflammation like ibuprofen, naproxen, or indomethacin  phenytoin  steroid medicines like prednisone or cortisone  sucralfate  thyroid hormones This list may not describe all possible interactions. Give your health care provider a list of all the medicines, herbs, non-prescription drugs, or dietary supplements you use. Also tell them if you smoke, drink alcohol, or use illegal drugs. Some items may interact with your medicine. What should I watch for while using this medicine? Visit your doctor or health care provider for regular checks on your progress. Check your blood pressure regularly. Ask your doctor or health care provider what your blood pressure should be, and when you should contact him or her. If you are a diabetic, check your blood sugar as directed. This medicine may cause serious skin reactions. They can happen weeks to months after starting the medicine. Contact your health care provider right away if you notice fevers or flu-like symptoms with a rash. The rash may be red or purple and then turn into blisters or peeling of the skin. Or, you  might notice a red rash with swelling of the face, lips or lymph nodes in your neck or under your arms. You may need to be on a special diet while taking this medicine. Check with your doctor. Also, ask how many glasses of fluid you need to drink a day. You must not get dehydrated. You may get drowsy or dizzy. Do not drive, use machinery, or do anything that needs mental alertness until you know how this drug affects you. Do not stand or sit up quickly, especially if you are an older patient. This reduces the risk of dizzy or fainting spells. Alcohol can make you more drowsy and dizzy. Avoid alcoholic drinks. This medicine can make you more sensitive to the sun. Keep out of the sun. If you cannot avoid being in the sun, wear protective clothing and use sunscreen. Do not use sun lamps or tanning beds/booths. What side effects may I notice from receiving this medicine? Side effects that you should report to your doctor or health care professional as soon as possible:  blood in urine or stools  dry  mouth  fever or chills  hearing loss or ringing in the ears  irregular heartbeat  muscle pain or weakness, cramps  rash, fever, and swollen lymph nodes  redness, blistering, peeling or loosening of the skin, including inside the mouth  skin rash  stomach upset, pain, or nausea  tingling or numbness in the hands or feet  unusually weak or tired  vomiting or diarrhea  yellowing of the eyes or skin Side effects that usually do not require medical attention (report to your doctor or health care professional if they continue or are bothersome):  headache  loss of appetite  unusual bleeding or bruising This list may not describe all possible side effects. Call your doctor for medical advice about side effects. You may report side effects to FDA at 1-800-FDA-1088. Where should I keep my medicine? Keep out of the reach of children. Store at room temperature between 15 and 30 degrees C (59  and 86 degrees F). Protect from light. Throw away any unused medicine after the expiration date. NOTE: This sheet is a summary. It may not cover all possible information. If you have questions about this medicine, talk to your doctor, pharmacist, or health care provider.  2020 Elsevier/Gold Standard (2019-02-01 14:04:13)

## 2019-08-09 ENCOUNTER — Other Ambulatory Visit: Payer: Self-pay | Admitting: Family Medicine

## 2019-08-09 DIAGNOSIS — I6930 Unspecified sequelae of cerebral infarction: Secondary | ICD-10-CM

## 2019-08-10 ENCOUNTER — Other Ambulatory Visit: Payer: Self-pay | Admitting: Family Medicine

## 2019-08-10 DIAGNOSIS — F419 Anxiety disorder, unspecified: Secondary | ICD-10-CM

## 2019-10-18 ENCOUNTER — Ambulatory Visit (INDEPENDENT_AMBULATORY_CARE_PROVIDER_SITE_OTHER): Payer: Medicare Other | Admitting: Family Medicine

## 2019-10-18 ENCOUNTER — Encounter: Payer: Self-pay | Admitting: Family Medicine

## 2019-10-18 VITALS — HR 89 | Temp 98.2°F | Ht 63.0 in | Wt 235.0 lb

## 2019-10-18 DIAGNOSIS — F419 Anxiety disorder, unspecified: Secondary | ICD-10-CM

## 2019-10-18 DIAGNOSIS — G6289 Other specified polyneuropathies: Secondary | ICD-10-CM

## 2019-10-18 DIAGNOSIS — R7309 Other abnormal glucose: Secondary | ICD-10-CM | POA: Diagnosis not present

## 2019-10-18 DIAGNOSIS — Z6841 Body Mass Index (BMI) 40.0 and over, adult: Secondary | ICD-10-CM | POA: Diagnosis not present

## 2019-10-18 DIAGNOSIS — G4700 Insomnia, unspecified: Secondary | ICD-10-CM

## 2019-10-18 DIAGNOSIS — E1165 Type 2 diabetes mellitus with hyperglycemia: Secondary | ICD-10-CM | POA: Diagnosis not present

## 2019-10-18 DIAGNOSIS — E66813 Obesity, class 3: Secondary | ICD-10-CM

## 2019-10-18 DIAGNOSIS — Z09 Encounter for follow-up examination after completed treatment for conditions other than malignant neoplasm: Secondary | ICD-10-CM | POA: Diagnosis not present

## 2019-10-18 DIAGNOSIS — I6930 Unspecified sequelae of cerebral infarction: Secondary | ICD-10-CM

## 2019-10-18 DIAGNOSIS — E119 Type 2 diabetes mellitus without complications: Secondary | ICD-10-CM

## 2019-10-18 DIAGNOSIS — R531 Weakness: Secondary | ICD-10-CM

## 2019-10-18 DIAGNOSIS — I10 Essential (primary) hypertension: Secondary | ICD-10-CM

## 2019-10-18 LAB — POCT URINALYSIS DIPSTICK
Bilirubin, UA: NEGATIVE
Blood, UA: NEGATIVE
Glucose, UA: NEGATIVE
Ketones, UA: NEGATIVE
Leukocytes, UA: NEGATIVE
Nitrite, UA: NEGATIVE
Protein, UA: NEGATIVE
Spec Grav, UA: <=1.005 — AB (ref 1.010–1.025)
Urobilinogen, UA: 0.2 E.U./dL
pH, UA: 5.5 (ref 5.0–8.0)

## 2019-10-18 LAB — POCT GLYCOSYLATED HEMOGLOBIN (HGB A1C): Hemoglobin A1C: 6.1 % — AB (ref 4.0–5.6)

## 2019-10-18 LAB — GLUCOSE, POCT (MANUAL RESULT ENTRY): POC Glucose: 156 mg/dl — AB (ref 70–99)

## 2019-10-18 MED ORDER — BD PEN NEEDLE NANO U/F 32G X 4 MM MISC: 1.0000 [IU] | Freq: Two times a day (BID) | 12 refills | Status: DC

## 2019-10-18 MED ORDER — DULOXETINE HCL 60 MG PO CPEP: 60.0000 mg | ORAL_CAPSULE | Freq: Every day | ORAL | 2 refills | Status: DC

## 2019-10-18 NOTE — Progress Notes (Signed)
Patient [facility]   Established Patient Office Visit  Subjective:  Patient ID: Sharon Chan, female    DOB: 04/17/1976  Age: 43 y.o. MRN: [id number]  CC:  Chief Complaint  Patient presents with  . Follow-up    DM    HPI Sharon Chan is a 43 year old female who presents for Follow Up today.   Past Medical History:  Diagnosis Date  . Anxiety   . CVA (cerebral vascular accident) ([place])   . Depression   . DM type 2 (diabetes mellitus, type 2) ([place])   . HLD (hyperlipidemia)   . HTN (hypertension)   . Joint pain   . Migraines   . Neck pain   . Obese   . Polycystic disease, ovaries   . Right sided weakness   . Seizures ([place])   . Shoulder pain   . Sickle cell trait ([place])   . Thyroid nodule    Current Status: Since her last office visit, she continues to have generalized body pain, r/t history of stroke. Her most recent normal range of preprandial blood glucose levels have been between 98-100. She has seen low range of 70 and high of 150 since his last office visit. She denies fatigue, frequent urination, blurred vision, excessive hunger, excessive thirst, weight gain, weight loss, and poor wound healing. She continues to check her feet regularly. She denies visual changes, chest pain, cough, shortness of breath, heart palpitations, and falls. She has occasional headaches and dizziness with position changes. Denies severe headaches, confusion, seizures, double vision, and blurred vision, nausea and vomiting. Her anxiety is moderate today. She denies suicidal ideations, homicidal ideations, or auditory hallucinations. She denies fevers, chills, recent infections, weight loss, and night sweats.  No reports of GI problems such as diarrhea, and constipation. She has no reports of blood in stools, dysuria and hematuria.   Past Surgical History:  Procedure Laterality Date  . LAPAROSCOPIC CHOLECYSTECTOMY      Family History   Problem Relation Age of Onset  . Heart attack Mother   . Diabetes Mother   . Hypertension Mother   . Hyperlipidemia Mother   . Post-traumatic stress disorder Father        committed suicide  . Diabetes Father   . Hypertension Sister   . Thyroid cancer Paternal Grandmother     Social History   Socioeconomic History  . Marital status: Single    Spouse name: Not on file  . Number of children: 0  . Years of education: Not on file  . Highest education level: Not on file  Occupational History  . Not on file  Social Needs  . Financial resource strain: Not on file  . Food insecurity    Worry: Not on file    Inability: Not on file  . Transportation needs    Medical: Not on file    Non-medical: Not on file  Tobacco Use  . Smoking status: Never Smoker  . Smokeless tobacco: Never Used  Substance and Sexual Activity  . Alcohol use: No  . Drug use: No  . Sexual activity: Not Currently  Lifestyle  . Physical activity    Days per week: Not on file    Minutes per session: Not on file  . Stress: Not on file  Relationships  . Social Herbalist on phone: Not on file    Gets together: Not on file    Attends religious service:  Not on file    Active member of club or organization: Not on file    Attends meetings of clubs or organizations: Not on file    Relationship status: Not on file  . Intimate partner violence    Fear of current or ex partner: Not on file    Emotionally abused: Not on file    Physically abused: Not on file    Forced sexual activity: Not on file  Other Topics Concern  . Not on file  Social History Narrative   Lives at home, mom currently staying with her   Left-handed   Caffeine: occasional decaf coffee or raspberry tea    Outpatient Medications Prior to Visit  Medication Sig Dispense Refill  . acetaminophen (TYLENOL) 325 MG tablet Take 1-2 tablets (325-650 mg total) by mouth every 4 (four) hours as needed for mild pain.    Marland Kitchen albuterol  (PROVENTIL HFA;VENTOLIN HFA) 108 (90 Base) MCG/ACT inhaler Inhale 2 puffs into the lungs every 6 (six) hours as needed for wheezing or shortness of breath. 1 Inhaler 11  . atorvastatin (LIPITOR) 40 MG tablet Take 1 tablet (40 mg total) by mouth daily at 6 PM. 30 tablet 6  . blood glucose meter kit and supplies KIT Dispense based on patient and insurance preference. Use up to four times daily as directed. (FOR ICD-9 250.00, 250.01). 1 each 0  . buPROPion (WELLBUTRIN SR) 200 MG 12 hr tablet TAKE 1 TABLET BY MOUTH TWICE A DAY 180 tablet 2  . cyclobenzaprine (FLEXERIL) 5 MG tablet TAKE 1 TABLET 3 TIMES A DAY AS NEEDED FOR MUSCLE SPASM SHOULDER/NECK PAIN 90 tablet 6  . diphenhydrAMINE (BENADRYL ALLERGY) 25 MG tablet Take 25 mg by mouth every 6 (six) hours as needed.    . furosemide (LASIX) 20 MG tablet Take 1 tablet (20 mg total) by mouth daily. 30 tablet 3  . gabapentin (NEURONTIN) 600 MG tablet Take 1 tablet (600 mg total) by mouth 3 (three) times daily. 90 tablet 3  . glucose blood (ONE TOUCH ULTRA TEST) test strip TEST TWICE A DAY 100 each 0  . liraglutide (VICTOZA) 18 MG/3ML SOPN Inject 0.2 mLs (1.2 mg total) into the skin every morning. 2 pen 6  . losartan (COZAAR) 25 MG tablet Take 1 tablet (25 mg total) by mouth daily. 30 tablet 5  . metFORMIN (GLUCOPHAGE) 500 MG tablet Take 1 tablet (500 mg total) by mouth 2 (two) times daily with a meal. 180 tablet 3  . OneTouch Delica Lancets 65K MISC TES 2 (TWO) TIMES DAILY. 100 each 5  . topiramate (TOPAMAX) 100 MG tablet TAKE 1 TABLET IN MORNING AND 2 TABLETS AT NIGHT 20 tablet 0  . Vitamin D, Ergocalciferol, (DRISDOL) 1.25 MG (50000 UT) CAPS capsule Take 1 capsule (50,000 Units total) by mouth every 7 (seven) days. 4 capsule 0  . DULoxetine (CYMBALTA) 30 MG capsule TAKE 1 CAPSULE BY MOUTH EVERYDAY AT BEDTIME 90 capsule 2  . Insulin Pen Needle (BD PEN NEEDLE NANO U/F) 32G X 4 MM MISC Inject 1 Units into the skin 2 (two) times daily. 100 each 0  . zolpidem  (AMBIEN) 5 MG tablet TAKE 1 TABLET BY MOUTH EVERY DAY AT BEDTIME AS NEEDED FOR SLEEP 30 tablet 3   No facility-administered medications prior to visit.     Allergies  Allergen Reactions  . Penicillins Anaphylaxis    ROS Review of Systems  Constitutional: Negative.   HENT: Negative.   Eyes: Negative.   Respiratory: Negative.  Cardiovascular: Negative.   Gastrointestinal: Positive for abdominal distention.  Endocrine: Negative.   Genitourinary: Negative.   Musculoskeletal: Negative.   Skin: Negative.   Allergic/Immunologic: Negative.   Neurological: Positive for dizziness (occasional ), weakness (right sided weakness) and headaches (occasional ).  Hematological: Negative.   Psychiatric/Behavioral: Negative.       Objective:    Physical Exam  Constitutional: She is oriented to person, place, and time. She appears well-developed and well-nourished.  HENT:  Head: Normocephalic and atraumatic.  Eyes: Conjunctivae are normal.  Neck: Normal range of motion. Neck supple.  Cardiovascular: Normal rate, normal heart sounds and intact distal pulses.  Pulmonary/Chest: Effort normal and breath sounds normal.  Abdominal: Soft. Bowel sounds are normal.  Musculoskeletal:     Comments: Limited ROM in right extremities.   Neurological: She is alert and oriented to person, place, and time.  Neuro deficits, r/t.   Skin: Skin is warm and dry.  Psychiatric: She has a normal mood and affect. Her behavior is normal. Judgment and thought content normal.  Nursing note and vitals reviewed.   Pulse 89   Temp 98.2 F (36.8 C)   Ht _0  (1.6 m)   Wt 235 lb (106.6 kg)   LMP 09/24/2019   SpO2 96%   BMI 41.63 kg/m  Wt Readings from Last 3 Encounters:  10/18/19 235 lb (106.6 kg)  07/19/19 236 lb 14.4 oz (107.5 kg)  01/07/19 213 lb (96.6 kg)     Health Maintenance Due  Topic Date Due  . PNEUMOCOCCAL POLYSACCHARIDE VACCINE AGE 41-64 HIGH RISK  11/15/1975  . OPHTHALMOLOGY EXAM   11/15/1983  . PAP SMEAR-Modifier  11/14/1994  . INFLUENZA VACCINE  06/15/2019    There are no preventive care reminders to display for this patient.  Lab Results  Component Value Date   TSH 0.57 12/03/2018   Lab Results  Component Value Date   WBC 9.3 05/28/2018   HGB 13.5 05/28/2018   HCT 41.5 05/28/2018   MCV 76 (L) 05/28/2018   PLT 308 09/12/2017   Lab Results  Component Value Date   NA 140 12/24/2018   K 4.3 12/24/2018   CO2 18 (L) 12/24/2018   GLUCOSE 85 12/24/2018   BUN 17 12/24/2018   CREATININE 1.18 (H) 12/24/2018   BILITOT 0.3 12/24/2018   ALKPHOS 94 12/24/2018   AST 12 12/24/2018   ALT 16 12/24/2018   PROT 6.1 12/24/2018   ALBUMIN 3.9 12/24/2018   CALCIUM 9.0 12/24/2018   ANIONGAP 8 03/08/2017   Lab Results  Component Value Date   CHOL 117 12/24/2018   Lab Results  Component Value Date   HDL 46 12/24/2018   Lab Results  Component Value Date   LDLCALC 62 12/24/2018   Lab Results  Component Value Date   TRIG 43 12/24/2018   Lab Results  Component Value Date   CHOLHDL 2.7 07/25/2017   Lab Results  Component Value Date   HGBA1C 6.1 (A) 10/18/2019    Assessment & Plan:   1. Type 2 diabetes mellitus without complication, without long-term current use of insulin (HCC) Stable. She will continue medication as prescribed, to decrease foods/beverages high in sugars and carbs and follow Heart Healthy or DASH diet. Increase physical activity to at least 30 minutes cardio exercise daily.  - POCT HgB A1C - Glucose (CBG) - Urinalysis Dipstick - Insulin Pen Needle (BD PEN NEEDLE NANO U/F) 32G X 4 MM MISC; Inject 1 Units into the skin 2 (two) times daily.  Dispense: 100 each; Refill: 12  2. Hyperglycemia due to diabetes mellitus ([place]) Blood glucose increased at 156 today.   3. Hemoglobin A1c less than 7.0% Hgb A1c stable at 6.1 today.   4. Anxiety  We will increase dosage of Duloxetine today.  - DULoxetine (CYMBALTA) 60 MG capsule; Take 1 capsule  (60 mg total) by mouth daily.  Dispense: 30 capsule; Refill: 2  5. Insomnia, unspecified type - DULoxetine (CYMBALTA) 60 MG capsule; Take 1 capsule (60 mg total) by mouth daily.  Dispense: 30 capsule; Refill: 2  6. Class 3 severe obesity due to excess calories with serious comorbidity and body mass index (BMI) of 40.0 to 44.9 in adult [facility]) Body mass index is 41.63 kg/m.  Goal BMI  is <30. Encouraged efforts to reduce weight include engaging in physical activity as tolerated with goal of 150 minutes per week. Improve dietary choices and eat a meal regimen consistent with a Mediterranean or DASH diet. Reduce simple carbohydrates. Do not skip meals and eat healthy snacks throughout the day to avoid over-eating at dinner. Set a goal weight loss that is achievable for you. - Amb Referral to Bariatric Surgery  7. Other polyneuropathy   8. Essential hypertension He will continue to take medications as prescribed, to decrease high sodium intake, excessive alcohol intake, increase potassium intake, smoking cessation, and increase physical activity of at least 30 minutes of cardio activity daily. He will continue to follow Heart Healthy or DASH diet.  9. Right sided weakness  10. History of CVA with residual deficit  11. Follow up She will follow up in 1 month for evaluation of Cymbalta and anxiety.    Meds ordered this encounter  Medications  . DULoxetine (CYMBALTA) 60 MG capsule    Sig: Take 1 capsule (60 mg total) by mouth daily.    Dispense:  30 capsule    Refill:  2  . Insulin Pen Needle (BD PEN NEEDLE NANO U/F) 32G X 4 MM MISC    Sig: Inject 1 Units into the skin 2 (two) times daily.    Dispense:  100 each    Refill:  12    Orders Placed This Encounter  Procedures  . Amb Referral to Bariatric Surgery  . POCT HgB A1C  . Glucose (CBG)  . Urinalysis Dipstick    Referral Orders     Amb Referral to Bariatric Surgery   Kathe Becton,  MSN, FNP-BC [facility] [address] [place], [place] [address] [phone] fax  Problem List Items Addressed This Visit      Cardiovascular and Mediastinum   Essential hypertension     Endocrine   Type 2 diabetes mellitus without complication, without long-term current use of insulin ([place]) - Primary   Relevant Medications   Insulin Pen Needle (BD PEN NEEDLE NANO U/F) 32G X 4 MM MISC   Other Relevant Orders   POCT HgB A1C (Completed)   Glucose (CBG) (Completed)   Urinalysis Dipstick (Completed)     Nervous and Auditory   Peripheral neuropathy   Relevant Medications   DULoxetine (CYMBALTA) 60 MG capsule     Other   Anxiety   Relevant Medications   DULoxetine (CYMBALTA) 60 MG capsule   Class 3 severe obesity due to excess calories with serious comorbidity and body mass index (BMI) of 40.0 to 44.9 in adult [facility])   Relevant Orders   Amb Referral to Bariatric Surgery  Insomnia   Relevant Medications   DULoxetine (CYMBALTA) 60 MG capsule    Other Visit Diagnoses    Hyperglycemia due to diabetes mellitus (HCC)       Hemoglobin A1c less than 7.0%       Right sided weakness       History of CVA with residual deficit       Follow up          Meds ordered this encounter  Medications  . DULoxetine (CYMBALTA) 60 MG capsule    Sig: Take 1 capsule (60 mg total) by mouth daily.    Dispense:  30 capsule    Refill:  2  . Insulin Pen Needle (BD PEN NEEDLE NANO U/F) 32G X 4 MM MISC    Sig: Inject 1 Units into the skin 2 (two) times daily.    Dispense:  100 each    Refill:  12    Follow-up: Return in about 1 month (around 11/18/2019).    Azzie Glatter, FNP

## 2019-10-21 DIAGNOSIS — I6930 Unspecified sequelae of cerebral infarction: Secondary | ICD-10-CM | POA: Insufficient documentation

## 2019-10-21 DIAGNOSIS — R531 Weakness: Secondary | ICD-10-CM | POA: Insufficient documentation

## 2019-11-02 ENCOUNTER — Other Ambulatory Visit: Payer: Self-pay | Admitting: Family Medicine

## 2019-11-02 DIAGNOSIS — R609 Edema, unspecified: Secondary | ICD-10-CM

## 2019-11-05 ENCOUNTER — Other Ambulatory Visit (INDEPENDENT_AMBULATORY_CARE_PROVIDER_SITE_OTHER): Payer: Self-pay | Admitting: Physician Assistant

## 2019-11-05 ENCOUNTER — Other Ambulatory Visit: Payer: Self-pay | Admitting: Family Medicine

## 2019-11-05 DIAGNOSIS — E119 Type 2 diabetes mellitus without complications: Secondary | ICD-10-CM

## 2019-11-05 DIAGNOSIS — E781 Pure hyperglyceridemia: Secondary | ICD-10-CM

## 2019-11-05 DIAGNOSIS — I10 Essential (primary) hypertension: Secondary | ICD-10-CM

## 2019-11-09 ENCOUNTER — Other Ambulatory Visit: Payer: Self-pay | Admitting: Family Medicine

## 2019-11-09 DIAGNOSIS — F419 Anxiety disorder, unspecified: Secondary | ICD-10-CM

## 2019-11-09 DIAGNOSIS — G4700 Insomnia, unspecified: Secondary | ICD-10-CM

## 2019-11-11 NOTE — Telephone Encounter (Signed)
Request Cymbalta 

## 2019-11-20 ENCOUNTER — Ambulatory Visit (INDEPENDENT_AMBULATORY_CARE_PROVIDER_SITE_OTHER): Payer: Medicare Other | Admitting: Family Medicine

## 2019-11-20 ENCOUNTER — Other Ambulatory Visit: Payer: Self-pay

## 2019-11-20 ENCOUNTER — Encounter: Payer: Self-pay | Admitting: Family Medicine

## 2019-11-20 VITALS — BP 155/99 | HR 97 | Temp 98.2°F | Ht 63.0 in | Wt 231.2 lb

## 2019-11-20 DIAGNOSIS — Z23 Encounter for immunization: Secondary | ICD-10-CM | POA: Diagnosis not present

## 2019-11-20 DIAGNOSIS — F419 Anxiety disorder, unspecified: Secondary | ICD-10-CM

## 2019-11-20 DIAGNOSIS — Z09 Encounter for follow-up examination after completed treatment for conditions other than malignant neoplasm: Secondary | ICD-10-CM | POA: Diagnosis not present

## 2019-11-20 DIAGNOSIS — Z6841 Body Mass Index (BMI) 40.0 and over, adult: Secondary | ICD-10-CM | POA: Diagnosis not present

## 2019-11-20 DIAGNOSIS — I10 Essential (primary) hypertension: Secondary | ICD-10-CM

## 2019-11-20 DIAGNOSIS — I6930 Unspecified sequelae of cerebral infarction: Secondary | ICD-10-CM | POA: Diagnosis not present

## 2019-11-20 DIAGNOSIS — E119 Type 2 diabetes mellitus without complications: Secondary | ICD-10-CM

## 2019-11-20 DIAGNOSIS — E66813 Obesity, class 3: Secondary | ICD-10-CM

## 2019-11-20 LAB — POCT URINALYSIS DIPSTICK
Bilirubin, UA: NEGATIVE
Glucose, UA: NEGATIVE
Ketones, UA: NEGATIVE
Leukocytes, UA: NEGATIVE
Nitrite, UA: NEGATIVE
Protein, UA: NEGATIVE
Spec Grav, UA: 1.015 (ref 1.010–1.025)
Urobilinogen, UA: 0.2 E.U./dL
pH, UA: 6 (ref 5.0–8.0)

## 2019-11-20 LAB — GLUCOSE, POCT (MANUAL RESULT ENTRY): POC Glucose: 107 mg/dl — AB (ref 70–99)

## 2019-11-20 MED ORDER — CLONIDINE HCL 0.1 MG PO TABS
0.2000 mg | ORAL_TABLET | Freq: Once | ORAL | Status: AC
  Administered 2019-11-20: 0.2 mg via ORAL

## 2019-11-20 NOTE — Progress Notes (Signed)
Patient [facility]   Established Patient Office Visit  Subjective:  Patient ID: Sharon Chan, female    DOB: Aug 29, 1976  Age: 44 y.o. MRN: [id number]  CC:  Chief Complaint  Patient presents with  . Follow-up    DM    HPI Sharon Chan is a 44 year old female who presents for Follow Up today.   Past Medical History:  Diagnosis Date  . Anxiety   . CVA (cerebral vascular accident) ([place])   . Depression   . DM type 2 (diabetes mellitus, type 2) ([place])   . HLD (hyperlipidemia)   . HTN (hypertension)   . Joint pain   . Migraines   . Neck pain   . Obese   . Polycystic disease, ovaries   . Right sided weakness   . Seizures ([place])   . Shoulder pain   . Sickle cell trait ([place])   . Thyroid nodule     Current Status: Since her last office visit, she is doing well with no complaints. She continues to have c/o bilateral leg pain. Her most recent normal range of preprandial blood glucose levels have been between 88-90. She denies fatigue, frequent urination, blurred vision, excessive hunger, excessive thirst, weight gain, weight loss, and poor wound healing. She continues to check her feet regularly. She denies visual changes, chest pain, cough, shortness of breath, heart palpitations, and falls. She has occasional headaches and dizziness with position changes. Denies severe headaches, confusion, seizures, double vision, and blurred vision, nausea and vomiting. She denies visual changes, chest pain, cough, shortness of breath, heart palpitations, and falls. She has occasional headaches.Her anxiety is increased today, r/t family and personal stressors. She has a history of Stroke and tried Stroke Support Group, but did not like atmosphere. She denies suicidal ideations, homicidal ideations, or auditory hallucinations. She is just completing her menstrual period a few days ago. She denies fevers, chills, recent infections, weight loss, and  night sweats. No reports of GI problems such as nausea, vomiting, diarrhea, and constipation. She has no reports of blood in stools, dysuria and hematuria.   Past Surgical History:  Procedure Laterality Date  . LAPAROSCOPIC CHOLECYSTECTOMY      Family History  Problem Relation Age of Onset  . Heart attack Mother   . Diabetes Mother   . Hypertension Mother   . Hyperlipidemia Mother   . Post-traumatic stress disorder Father        committed suicide  . Diabetes Father   . Hypertension Sister   . Thyroid cancer Paternal Grandmother     Social History   Socioeconomic History  . Marital status: Single    Spouse name: Not on file  . Number of children: 0  . Years of education: Not on file  . Highest education level: Not on file  Occupational History  . Not on file  Tobacco Use  . Smoking status: Never Smoker  . Smokeless tobacco: Never Used  Substance and Sexual Activity  . Alcohol use: No  . Drug use: No  . Sexual activity: Not Currently  Other Topics Concern  . Not on file  Social History Narrative   Lives at home, mom currently staying with her   Left-handed   Caffeine: occasional decaf coffee or raspberry tea   Social Determinants of Health   Financial Resource Strain:   . Difficulty of Paying Living Expenses: Not on file  Food Insecurity:   . Worried About Estate manager/land agent  of Food in the Last Year: Not on file  . Ran Out of Food in the Last Year: Not on file  Transportation Needs:   . Lack of Transportation (Medical): Not on file  . Lack of Transportation (Non-Medical): Not on file  Physical Activity:   . Days of Exercise per Week: Not on file  . Minutes of Exercise per Session: Not on file  Stress:   . Feeling of Stress : Not on file  Social Connections:   . Frequency of Communication with Friends and Family: Not on file  . Frequency of Social Gatherings with Friends and Family: Not on file  . Attends Religious Services: Not on file  . Active Member of Clubs  or Organizations: Not on file  . Attends Archivist Meetings: Not on file  . Marital Status: Not on file  Intimate Partner Violence:   . Fear of Current or Ex-Partner: Not on file  . Emotionally Abused: Not on file  . Physically Abused: Not on file  . Sexually Abused: Not on file    Outpatient Medications Prior to Visit  Medication Sig Dispense Refill  . acetaminophen (TYLENOL) 325 MG tablet Take 1-2 tablets (325-650 mg total) by mouth every 4 (four) hours as needed for mild pain.    Marland Kitchen albuterol (PROVENTIL HFA;VENTOLIN HFA) 108 (90 Base) MCG/ACT inhaler Inhale 2 puffs into the lungs every 6 (six) hours as needed for wheezing or shortness of breath. 1 Inhaler 11  . atorvastatin (LIPITOR) 40 MG tablet TAKE 1 TABLET (40 MG TOTAL) BY MOUTH DAILY AT 6 PM. 90 tablet 2  . blood glucose meter kit and supplies KIT Dispense based on patient and insurance preference. Use up to four times daily as directed. (FOR ICD-9 250.00, 250.01). 1 each 0  . buPROPion (WELLBUTRIN SR) 200 MG 12 hr tablet TAKE 1 TABLET BY MOUTH TWICE A DAY 180 tablet 2  . cyclobenzaprine (FLEXERIL) 5 MG tablet TAKE 1 TABLET 3 TIMES A DAY AS NEEDED FOR MUSCLE SPASM SHOULDER/NECK PAIN 90 tablet 6  . diphenhydrAMINE (BENADRYL ALLERGY) 25 MG tablet Take 25 mg by mouth every 6 (six) hours as needed.    . DULoxetine (CYMBALTA) 60 MG capsule TAKE 1 CAPSULE BY MOUTH EVERY DAY 90 capsule 1  . furosemide (LASIX) 20 MG tablet TAKE 1 TABLET BY MOUTH EVERY DAY 90 tablet 1  . gabapentin (NEURONTIN) 600 MG tablet Take 1 tablet (600 mg total) by mouth 3 (three) times daily. 90 tablet 3  . glucose blood (ONE TOUCH ULTRA TEST) test strip TEST TWICE A DAY 100 each 0  . Insulin Pen Needle (BD PEN NEEDLE NANO U/F) 32G X 4 MM MISC Inject 1 Units into the skin 2 (two) times daily. 100 each 12  . liraglutide (VICTOZA) 18 MG/3ML SOPN Inject 0.2 mLs (1.2 mg total) into the skin every morning. 2 pen 6  . losartan (COZAAR) 25 MG tablet TAKE 1 TABLET  BY MOUTH EVERY DAY 90 tablet 1  . metFORMIN (GLUCOPHAGE) 500 MG tablet Take 1 tablet (500 mg total) by mouth 2 (two) times daily with a meal. 180 tablet 3  . OneTouch Delica Lancets 16X MISC TES 2 (TWO) TIMES DAILY. 100 each 5  . topiramate (TOPAMAX) 100 MG tablet TAKE 1 TABLET IN MORNING AND 2 TABLETS AT NIGHT 20 tablet 0  . Vitamin D, Ergocalciferol, (DRISDOL) 1.25 MG (50000 UT) CAPS capsule Take 1 capsule (50,000 Units total) by mouth every 7 (seven) days. 4 capsule 0  .  zolpidem (AMBIEN) 5 MG tablet TAKE 1 TABLET BY MOUTH EVERY DAY AT BEDTIME AS NEEDED FOR SLEEP 30 tablet 3   No facility-administered medications prior to visit.    Allergies  Allergen Reactions  . Penicillins Anaphylaxis    ROS Review of Systems  Constitutional: Negative.   HENT: Negative.   Eyes: Negative.   Respiratory: Negative.   Cardiovascular: Negative.   Gastrointestinal: Negative.   Endocrine: Negative.   Genitourinary: Negative.   Musculoskeletal: Positive for arthralgias (generalized pain).  Skin: Negative.   Allergic/Immunologic: Negative.   Hematological: Negative.   Psychiatric/Behavioral: Negative.       Objective:    Physical Exam  Constitutional: She is oriented to person, place, and time. She appears well-developed and well-nourished.  HENT:  Head: Normocephalic and atraumatic.  Eyes: Conjunctivae are normal.  Cardiovascular: Normal rate, regular rhythm, normal heart sounds and intact distal pulses.  Pulmonary/Chest: Effort normal and breath sounds normal.  Abdominal: Soft. Bowel sounds are normal.  Musculoskeletal:        General: Normal range of motion.     Cervical back: Normal range of motion and neck supple.  Neurological: She is alert and oriented to person, place, and time. She has normal reflexes.  Skin: Skin is warm and dry.  Psychiatric: She has a normal mood and affect. Her behavior is normal. Judgment and thought content normal.  Nursing note and vitals reviewed.    BP (!) 147/100   Pulse 97   Temp 98.2 F (36.8 C) (Oral)   Ht 5' 3"  (1.6 m)   Wt 231 lb 3.2 oz (104.9 kg)   SpO2 99%   BMI 40.96 kg/m  Wt Readings from Last 3 Encounters:  11/20/19 231 lb 3.2 oz (104.9 kg)  10/18/19 235 lb (106.6 kg)  07/19/19 236 lb 14.4 oz (107.5 kg)     Health Maintenance Due  Topic Date Due  . PNEUMOCOCCAL POLYSACCHARIDE VACCINE AGE 46-64 HIGH RISK  11/15/1975  . OPHTHALMOLOGY EXAM  11/15/1983  . PAP SMEAR-Modifier  11/14/1994  . INFLUENZA VACCINE  06/15/2019    There are no preventive care reminders to display for this patient.  Lab Results  Component Value Date   TSH 0.57 12/03/2018   Lab Results  Component Value Date   WBC 9.3 05/28/2018   HGB 13.5 05/28/2018   HCT 41.5 05/28/2018   MCV 76 (L) 05/28/2018   PLT 308 09/12/2017   Lab Results  Component Value Date   NA 140 12/24/2018   K 4.3 12/24/2018   CO2 18 (L) 12/24/2018   GLUCOSE 85 12/24/2018   BUN 17 12/24/2018   CREATININE 1.18 (H) 12/24/2018   BILITOT 0.3 12/24/2018   ALKPHOS 94 12/24/2018   AST 12 12/24/2018   ALT 16 12/24/2018   PROT 6.1 12/24/2018   ALBUMIN 3.9 12/24/2018   CALCIUM 9.0 12/24/2018   ANIONGAP 8 03/08/2017   Lab Results  Component Value Date   CHOL 117 12/24/2018   Lab Results  Component Value Date   HDL 46 12/24/2018   Lab Results  Component Value Date   LDLCALC 62 12/24/2018   Lab Results  Component Value Date   TRIG 43 12/24/2018   Lab Results  Component Value Date   CHOLHDL 2.7 07/25/2017   Lab Results  Component Value Date   HGBA1C 6.1 (A) 10/18/2019      Assessment & Plan:   1. Type 2 diabetes mellitus without complication, without long-term current use of insulin (HCC) Blood glucose is  stable today. He will continue medication as prescribed, to decrease foods/beverages high in sugars and carbs and follow Heart Healthy or DASH diet. Increase physical activity to at least 30 minutes cardio exercise daily.  - Urinalysis Dipstick -  Glucose (CBG)  2. Needs flu shot - Flu Vaccine QUAD 6+ mos PF IM (Fluarix Quad PF)  3. Anxiety Moderate today. Continue Wellbutrin as prescribed.  4. Class 3 severe obesity due to excess calories with serious comorbidity and body mass index (BMI) of 40.0 to 44.9 in adult [facility]) Body mass index is 40.96 kg/m. Goal BMI  is <30. Encouraged efforts to reduce weight include engaging in physical activity as tolerated with goal of 150 minutes per week. Improve dietary choices and eat a meal regimen consistent with a Mediterranean or DASH diet. Reduce simple carbohydrates. Do not skip meals and eat healthy snacks throughout the day to avoid over-eating at dinner. Set a goal weight loss that is achievable for you.  5. History of CVA with residual deficit Stable. No signs or symptoms of respiratory distress noted or reported.   6. Elevated blood pressure Blood pressures are elevated today. Clonidine 0.2 mg given to patient in office and blood pressures remain elevated. We will increase Losartan to 50 mg today. She denies severe headaches, confusion, seizures, double vision, and blurred vision, nausea and vomiting. She will report to ED if she experiences these symptoms. Patient verbalized understanding.    7. Essential hypertension  8. Follow up He will follow up in 2 months.   No orders of the defined types were placed in this encounter.   Orders Placed This Encounter  Procedures  . Flu Vaccine QUAD 6+ mos PF IM (Fluarix Quad PF)  . Urinalysis Dipstick  . Glucose (CBG)    Referral Orders  No referral(s) requested today    Kathe Becton,  MSN, FNP-BC [address] [place], [place] [id number] [phone]- fax     Problem List Items Addressed This Visit      Cardiovascular and Mediastinum   Essential hypertension     Endocrine   Type 2 diabetes mellitus without complication, without  long-term current use of insulin ([place]) - Primary   Relevant Orders   Urinalysis Dipstick (Completed)   Glucose (CBG) (Completed)     Other   Anxiety   Class 3 severe obesity due to excess calories with serious comorbidity and body mass index (BMI) of 40.0 to 44.9 in adult [facility])   History of CVA with residual deficit    Other Visit Diagnoses    Needs flu shot       Relevant Orders   Flu Vaccine QUAD 6+ mos PF IM (Fluarix Quad PF)   Follow up          No orders of the defined types were placed in this encounter.   Follow-up: Return in about 2 months (around 01/18/2020) for OV.    Azzie Glatter, FNP

## 2019-11-21 MED ORDER — LOSARTAN POTASSIUM 50 MG PO TABS: 50.0000 mg | ORAL_TABLET | Freq: Every day | ORAL | 2 refills | Status: DC

## 2019-12-10 ENCOUNTER — Other Ambulatory Visit: Payer: Self-pay | Admitting: Family Medicine

## 2019-12-10 DIAGNOSIS — I6930 Unspecified sequelae of cerebral infarction: Secondary | ICD-10-CM

## 2019-12-10 NOTE — Telephone Encounter (Signed)
Cyclobenzaprine 5 MG refill request

## 2019-12-13 ENCOUNTER — Other Ambulatory Visit: Payer: Self-pay | Admitting: Neurology

## 2019-12-13 DIAGNOSIS — R519 Headache, unspecified: Secondary | ICD-10-CM

## 2019-12-13 NOTE — Telephone Encounter (Signed)
I have not seen her since 2018 and last refills were from PCP, pls request refills from PCP. Thanks

## 2020-01-13 DIAGNOSIS — E559 Vitamin D deficiency, unspecified: Secondary | ICD-10-CM

## 2020-01-13 HISTORY — DX: Vitamin D deficiency, unspecified: E55.9

## 2020-01-21 ENCOUNTER — Other Ambulatory Visit: Payer: Self-pay | Admitting: Family Medicine

## 2020-01-21 DIAGNOSIS — G4700 Insomnia, unspecified: Secondary | ICD-10-CM

## 2020-01-21 NOTE — Telephone Encounter (Signed)
Refill request for zolpidem. Please advise.  °

## 2020-02-03 ENCOUNTER — Telehealth: Payer: Self-pay

## 2020-02-03 ENCOUNTER — Other Ambulatory Visit: Payer: Self-pay

## 2020-02-03 ENCOUNTER — Ambulatory Visit (INDEPENDENT_AMBULATORY_CARE_PROVIDER_SITE_OTHER): Payer: Medicare Other | Admitting: Family Medicine

## 2020-02-03 ENCOUNTER — Encounter: Payer: Self-pay | Admitting: Family Medicine

## 2020-02-03 VITALS — BP 143/95 | HR 103 | Temp 97.8°F | Ht 63.0 in | Wt 232.6 lb

## 2020-02-03 DIAGNOSIS — Z6841 Body Mass Index (BMI) 40.0 and over, adult: Secondary | ICD-10-CM

## 2020-02-03 DIAGNOSIS — I6930 Unspecified sequelae of cerebral infarction: Secondary | ICD-10-CM | POA: Diagnosis not present

## 2020-02-03 DIAGNOSIS — R7309 Other abnormal glucose: Secondary | ICD-10-CM | POA: Insufficient documentation

## 2020-02-03 DIAGNOSIS — E119 Type 2 diabetes mellitus without complications: Secondary | ICD-10-CM

## 2020-02-03 DIAGNOSIS — E66813 Obesity, class 3: Secondary | ICD-10-CM

## 2020-02-03 DIAGNOSIS — E1165 Type 2 diabetes mellitus with hyperglycemia: Secondary | ICD-10-CM | POA: Diagnosis not present

## 2020-02-03 DIAGNOSIS — R531 Weakness: Secondary | ICD-10-CM | POA: Diagnosis not present

## 2020-02-03 DIAGNOSIS — Z09 Encounter for follow-up examination after completed treatment for conditions other than malignant neoplasm: Secondary | ICD-10-CM | POA: Diagnosis not present

## 2020-02-03 DIAGNOSIS — E559 Vitamin D deficiency, unspecified: Secondary | ICD-10-CM | POA: Diagnosis not present

## 2020-02-03 DIAGNOSIS — E538 Deficiency of other specified B group vitamins: Secondary | ICD-10-CM | POA: Diagnosis not present

## 2020-02-03 DIAGNOSIS — N926 Irregular menstruation, unspecified: Secondary | ICD-10-CM | POA: Diagnosis not present

## 2020-02-03 DIAGNOSIS — G4700 Insomnia, unspecified: Secondary | ICD-10-CM | POA: Diagnosis not present

## 2020-02-03 DIAGNOSIS — Z7189 Other specified counseling: Secondary | ICD-10-CM

## 2020-02-03 DIAGNOSIS — F419 Anxiety disorder, unspecified: Secondary | ICD-10-CM | POA: Diagnosis not present

## 2020-02-03 DIAGNOSIS — I10 Essential (primary) hypertension: Secondary | ICD-10-CM

## 2020-02-03 LAB — POCT URINALYSIS DIPSTICK
Bilirubin, UA: NEGATIVE
Blood, UA: NEGATIVE
Glucose, UA: NEGATIVE
Ketones, UA: NEGATIVE
Leukocytes, UA: NEGATIVE
Nitrite, UA: NEGATIVE
Protein, UA: NEGATIVE
Spec Grav, UA: 1.02 (ref 1.010–1.025)
Urobilinogen, UA: 0.2 E.U./dL
pH, UA: 5.5 (ref 5.0–8.0)

## 2020-02-03 MED ORDER — ZOLPIDEM TARTRATE 5 MG PO TABS: ORAL_TABLET | ORAL | 0 refills | Status: DC

## 2020-02-03 NOTE — Progress Notes (Signed)
Patient Sharon Chan   Established Patient Office Visit  Subjective:  Patient ID: MAELIN KURKOWSKI, female    DOB: 02-25-1976  Age: 44 y.o. MRN: [id number]  CC:  Chief Complaint  Patient presents with  . Follow-up    DM    HPI Darlynn Ricco Holten is a 44 year old female who presents for Follow Up today.   Past Medical History:  Diagnosis Date  . Anxiety   . CVA (cerebral vascular accident) ([place])   . Depression   . DM type 2 (diabetes mellitus, type 2) ([place])   . HLD (hyperlipidemia)   . HTN (hypertension)   . Joint pain   . Migraines   . Neck pain   . Obese   . Polycystic disease, ovaries   . Right sided weakness   . Seizures ([place])   . Shoulder pain   . Sickle cell trait ([place])   . Thyroid nodule    Current Status: Since her last office visit, her anxiety is increased today. She recently lost a close friend and she found her friend in her apartment. She denies suicidal ideations, homicidal ideations, or auditory hallucinations. She continues to have right-sided numbness/weakness, which is very scary to her. She is currently continues to follow up with counselor. She is doing well with no complaints. Her most recent normal range of preprandial blood glucose levels have been between 90-120 . She denies fatigue, frequent urination, blurred vision, excessive hunger, excessive thirst, weight gain, weight loss, and poor wound healing. She continues to check her feet regularly. She has not been taking her blood pressure medications X 1 month now. She denies visual changes, chest pain, cough, shortness of breath, heart palpitations, and falls. She has occasional headaches and dizziness with position changes. Denies severe headaches, confusion, seizures, double vision, and blurred vision, nausea and vomiting. She reports that her food has been 'just going through her.' Stools are loose and yellow. No reports of GI problems such as nausea,  vomiting, and constipation. She denies fevers, chills, recent infections, weight loss, and night sweats. She has no reports of blood in stools, dysuria and hematuria.  She denies pain today.   Past Surgical History:  Procedure Laterality Date  . LAPAROSCOPIC CHOLECYSTECTOMY      Family History  Problem Relation Age of Onset  . Heart attack Mother   . Diabetes Mother   . Hypertension Mother   . Hyperlipidemia Mother   . Post-traumatic stress disorder Father        committed suicide  . Diabetes Father   . Hypertension Sister   . Thyroid cancer Paternal Grandmother     Social History   Socioeconomic History  . Marital status: Single    Spouse name: Not on file  . Number of children: 0  . Years of education: Not on file  . Highest education level: Not on file  Occupational History  . Not on file  Tobacco Use  . Smoking status: Never Smoker  . Smokeless tobacco: Never Used  Substance and Sexual Activity  . Alcohol use: No  . Drug use: No  . Sexual activity: Not Currently  Other Topics Concern  . Not on file  Social History Narrative   Lives at home, mom currently staying with her   Left-handed   Caffeine: occasional decaf coffee or raspberry tea   Social Determinants of Health   Financial Resource Strain:   . Difficulty of Paying Living Expenses:  Food Insecurity:   . Worried About Charity fundraiser in the Last Year:   . Arboriculturist in the Last Year:   Transportation Needs:   . Film/video editor (Medical):   Marland Kitchen Lack of Transportation (Non-Medical):   Physical Activity:   . Days of Exercise per Week:   . Minutes of Exercise per Session:   Stress:   . Feeling of Stress :   Social Connections:   . Frequency of Communication with Friends and Family:   . Frequency of Social Gatherings with Friends and Family:   . Attends Religious Services:   . Active Member of Clubs or Organizations:   . Attends Archivist Meetings:   Marland Kitchen Marital Status:     Intimate Partner Violence:   . Fear of Current or Ex-Partner:   . Emotionally Abused:   Marland Kitchen Physically Abused:   . Sexually Abused:     Outpatient Medications Prior to Visit  Medication Sig Dispense Refill  . acetaminophen (TYLENOL) 325 MG tablet Take 1-2 tablets (325-650 mg total) by mouth every 4 (four) hours as needed for mild pain.    Marland Kitchen albuterol (PROVENTIL HFA;VENTOLIN HFA) 108 (90 Base) MCG/ACT inhaler Inhale 2 puffs into the lungs every 6 (six) hours as needed for wheezing or shortness of breath. 1 Inhaler 11  . atorvastatin (LIPITOR) 40 MG tablet TAKE 1 TABLET (40 MG TOTAL) BY MOUTH DAILY AT 6 PM. 90 tablet 2  . blood glucose meter kit and supplies KIT Dispense based on patient and insurance preference. Use up to four times daily as directed. (FOR ICD-9 250.00, 250.01). 1 each 0  . cyclobenzaprine (FLEXERIL) 5 MG tablet TAKE 1 TABLET 3 TIMES A DAY AS NEEDED FOR MUSCLE SPASM SHOULDER/NECK PAIN 90 tablet 6  . diphenhydrAMINE (BENADRYL ALLERGY) 25 MG tablet Take 25 mg by mouth every 6 (six) hours as needed.    . furosemide (LASIX) 20 MG tablet TAKE 1 TABLET BY MOUTH EVERY DAY 90 tablet 1  . gabapentin (NEURONTIN) 600 MG tablet Take 1 tablet (600 mg total) by mouth 3 (three) times daily. 90 tablet 3  . glucose blood (ONE TOUCH ULTRA TEST) test strip TEST TWICE A DAY 100 each 0  . Insulin Pen Needle (BD PEN NEEDLE NANO U/F) 32G X 4 MM MISC Inject 1 Units into the skin 2 (two) times daily. 100 each 12  . liraglutide (VICTOZA) 18 MG/3ML SOPN Inject 0.2 mLs (1.2 mg total) into the skin every morning. 2 pen 6  . metFORMIN (GLUCOPHAGE) 500 MG tablet Take 1 tablet (500 mg total) by mouth 2 (two) times daily with a meal. 180 tablet 3  . OneTouch Delica Lancets 07P MISC TES 2 (TWO) TIMES DAILY. 100 each 5  . topiramate (TOPAMAX) 100 MG tablet TAKE 1 TABLET IN MORNING AND 2 TABLETS AT NIGHT 20 tablet 0  . Vitamin D, Ergocalciferol, (DRISDOL) 1.25 MG (50000 UT) CAPS capsule Take 1 capsule (50,000  Units total) by mouth every 7 (seven) days. 4 capsule 0  . zolpidem (AMBIEN) 5 MG tablet TAKE 1 TABLET BY MOUTH AT BEDTIME AS NEEDED FOR SLEEP 30 tablet 0  . buPROPion (WELLBUTRIN SR) 200 MG 12 hr tablet TAKE 1 TABLET BY MOUTH TWICE A DAY (Patient not taking: Reported on 02/03/2020) 180 tablet 2  . DULoxetine (CYMBALTA) 60 MG capsule TAKE 1 CAPSULE BY MOUTH EVERY DAY (Patient not taking: Reported on 02/03/2020) 90 capsule 1  . losartan (COZAAR) 50 MG tablet Take 1  tablet (50 mg total) by mouth daily. (Patient not taking: Reported on 02/03/2020) 30 tablet 2   No facility-administered medications prior to visit.    Allergies  Allergen Reactions  . Penicillins Anaphylaxis    ROS Review of Systems  Constitutional: Negative.   HENT: Negative.   Eyes: Negative.   Respiratory: Negative.   Cardiovascular: Negative.   Gastrointestinal: Positive for abdominal distention (obese).  Endocrine: Negative.   Genitourinary: Negative.  Difficulty urinating: occasional   Musculoskeletal: Positive for arthralgias (generalized. ).  Skin: Negative.   Allergic/Immunologic: Negative.   Neurological: Positive for dizziness and headaches (occasional ).  Hematological: Negative.   Psychiatric/Behavioral:       Increased anxiety today.       Objective:    Physical Exam  Constitutional: She is oriented to person, place, and time. She appears well-developed and well-nourished.  HENT:  Head: Normocephalic and atraumatic.  Eyes: Conjunctivae are normal.  Cardiovascular: Normal rate, regular rhythm, normal heart sounds and intact distal pulses.  Pulmonary/Chest: Effort normal and breath sounds normal.  Abdominal: Soft. Bowel sounds are normal. She exhibits distension (obese).  Musculoskeletal:        General: Normal range of motion.     Cervical back: Normal range of motion.  Neurological: She is alert and oriented to person, place, and time. She has normal reflexes.  Skin: Skin is warm and dry.    Psychiatric: She has a normal mood and affect. Her behavior is normal. Judgment and thought content normal.  Nursing note and vitals reviewed.   BP (!) 143/95   Pulse (!) 103   Temp 97.8 F (36.6 C) (Oral)   Ht 5' 3"  (1.6 m)   Wt 232 lb 9.6 oz (105.5 kg)   LMP 01/13/2020   SpO2 98%   BMI 41.20 kg/m  Wt Readings from Last 3 Encounters:  02/03/20 232 lb 9.6 oz (105.5 kg)  11/20/19 231 lb 3.2 oz (104.9 kg)  10/18/19 235 lb (106.6 kg)     Health Maintenance Due  Topic Date Due  . PNEUMOCOCCAL POLYSACCHARIDE VACCINE AGE 42-64 HIGH RISK  Never done  . OPHTHALMOLOGY EXAM  Never done  . PAP SMEAR-Modifier  Never done  . FOOT EXAM  01/02/2020    There are no preventive Chan reminders to display for this patient.  Lab Results  Component Value Date   TSH 0.57 12/03/2018   Lab Results  Component Value Date   WBC 9.3 05/28/2018   HGB 13.5 05/28/2018   HCT 41.5 05/28/2018   MCV 76 (L) 05/28/2018   PLT 308 09/12/2017   Lab Results  Component Value Date   NA 140 12/24/2018   K 4.3 12/24/2018   CO2 18 (L) 12/24/2018   GLUCOSE 85 12/24/2018   BUN 17 12/24/2018   CREATININE 1.18 (H) 12/24/2018   BILITOT 0.3 12/24/2018   ALKPHOS 94 12/24/2018   AST 12 12/24/2018   ALT 16 12/24/2018   PROT 6.1 12/24/2018   ALBUMIN 3.9 12/24/2018   CALCIUM 9.0 12/24/2018   ANIONGAP 8 03/08/2017   Lab Results  Component Value Date   CHOL 117 12/24/2018   Lab Results  Component Value Date   HDL 46 12/24/2018   Lab Results  Component Value Date   LDLCALC 62 12/24/2018   Lab Results  Component Value Date   TRIG 43 12/24/2018   Lab Results  Component Value Date   CHOLHDL 2.7 07/25/2017   Lab Results  Component Value Date   HGBA1C 6.1 (  A) 10/18/2019      Assessment & Plan:   1. Essential hypertension Blood pressure is stable today. She will continue to take medications as prescribed, to decrease high sodium intake, excessive alcohol intake, increase potassium intake,  smoking cessation, and increase physical activity of at least 30 minutes of cardio activity daily. She will continue to follow Heart Healthy or DASH diet. - POCT urinalysis dipstick - CBC with Differential - Comprehensive metabolic panel - Lipid Panel - TSH  2. Type 2 diabetes mellitus without complication, without long-term current use of insulin ([place])  3. Hemoglobin A1c less than 7.0% Hgb A1c stable at 6.1 today. Monitor.   4. Hyperglycemia due to diabetes mellitus (HCC)  5. Class 3 severe obesity due to excess calories with serious comorbidity and body mass index (BMI) of 40.0 to 44.9 in adult [facility]) Body mass index is 41.2 kg/m. Goal BMI  is <30. Encouraged efforts to reduce weight include engaging in physical activity as tolerated with goal of 150 minutes per week. Improve dietary choices and eat a meal regimen consistent with a Mediterranean or DASH diet. Reduce simple carbohydrates. Do not skip meals and eat healthy snacks throughout the day to avoid over-eating at dinner. Set a goal weight loss that is achievable for you.  6. Grief counseling We will refer her to [facility] today.  - Ambulatory referral to Psychiatry - zolpidem (AMBIEN) 5 MG tablet; Take 1 to 1 & 1/2 tablets, by mouth, at bedtime.  Dispense: 45 tablet; Refill: 0  7. Insomnia, unspecified type  8. Irregular periods  9. Elevated blood pressure reading with diagnosis of hypertension She will re-start taking her blood pressure medications today.   10. Anxiety Increased. We will refer her to Psychiatry today.  - Ambulatory referral to Psychiatry - zolpidem (AMBIEN) 5 MG tablet; Take 1 to 1 & 1/2 tablets, by mouth, at bedtime.  Dispense: 45 tablet; Refill: 0  11. History of CVA with residual deficit  12. Right sided weakness  13. Follow up She will follow up in 3 months.    Meds ordered this encounter  Medications  . zolpidem (AMBIEN) 5 MG tablet    Sig: Take 1 to 1 & 1/2 tablets, by  mouth, at bedtime.    Dispense:  45 tablet    Refill:  0    Orders Placed This Encounter  Procedures  . CBC with Differential  . Comprehensive metabolic panel  . Lipid Panel  . TSH  . Vitamin B12  . Vitamin D, 25-hydroxy  . Ambulatory referral to Psychiatry  . POCT urinalysis dipstick    Referral Orders     Ambulatory referral to Psychiatry   Kathe Becton,  MSN, FNP-BC [facility] [address] [place], [place] [address] [phone] fax      Problem List Items Addressed This Visit      Cardiovascular and Mediastinum   Essential hypertension - Primary   Relevant Orders   POCT urinalysis dipstick (Completed)   CBC with Differential   Comprehensive metabolic panel   Lipid Panel   TSH     Endocrine   Type 2 diabetes mellitus without complication, without long-term current use of insulin (HCC)     Nervous and Auditory   Right sided weakness     Other   Anxiety   Relevant Medications   zolpidem (AMBIEN) 5 MG tablet   Other Relevant Orders   Ambulatory referral to  Psychiatry   Class 3 severe obesity due to excess calories with serious comorbidity and body mass index (BMI) of 40.0 to 44.9 in adult [facility])   History of CVA with residual deficit   Insomnia   Vitamin D deficiency   Relevant Orders   Vitamin D, 25-hydroxy    Other Visit Diagnoses    Hemoglobin A1c less than 7.0%       Hyperglycemia due to diabetes mellitus (HCC)       Grief counseling       Relevant Medications   zolpidem (AMBIEN) 5 MG tablet   Other Relevant Orders   Ambulatory referral to Psychiatry   Irregular periods       Elevated blood pressure reading with diagnosis of hypertension       Follow up       Vitamin B12 deficiency       Relevant Orders   Vitamin B12      Meds ordered this encounter  Medications  . zolpidem (AMBIEN) 5 MG tablet    Sig: Take 1 to 1 & 1/2 tablets, by mouth, at  bedtime.    Dispense:  45 tablet    Refill:  0    Follow-up: No follow-ups on file.    Azzie Glatter, FNP

## 2020-02-03 NOTE — Telephone Encounter (Signed)
Message left on the Grief Counseling voicemail to reach out to [employer] ASAP.

## 2020-02-04 ENCOUNTER — Encounter: Payer: Self-pay | Admitting: Family Medicine

## 2020-02-04 ENCOUNTER — Other Ambulatory Visit: Payer: Self-pay | Admitting: Family Medicine

## 2020-02-04 DIAGNOSIS — E559 Vitamin D deficiency, unspecified: Secondary | ICD-10-CM

## 2020-02-04 LAB — COMPREHENSIVE METABOLIC PANEL
ALT: 12 IU/L (ref 0–32)
AST: 11 IU/L (ref 0–40)
Albumin/Globulin Ratio: 1.6 (ref 1.2–2.2)
Albumin: 4.1 g/dL (ref 3.8–4.8)
Alkaline Phosphatase: 97 IU/L (ref 39–117)
BUN/Creatinine Ratio: 13 (ref 9–23)
BUN: 12 mg/dL (ref 6–24)
Bilirubin Total: 0.2 mg/dL (ref 0.0–1.2)
CO2: 26 mmol/L (ref 20–29)
Calcium: 9.5 mg/dL (ref 8.7–10.2)
Chloride: 103 mmol/L (ref 96–106)
Creatinine, Ser: 0.95 mg/dL (ref 0.57–1.00)
GFR calc Af Amer: 83 mL/min/{1.73_m2} (ref >59)
GFR calc non Af Amer: 72 mL/min/{1.73_m2} (ref >59)
Globulin, Total: 2.6 g/dL (ref 1.5–4.5)
Glucose: 132 mg/dL — ABNORMAL HIGH (ref 65–99)
Potassium: 3.8 mmol/L (ref 3.5–5.2)
Sodium: 144 mmol/L (ref 134–144)
Total Protein: 6.7 g/dL (ref 6.0–8.5)

## 2020-02-04 LAB — TSH: TSH: 0.747 u[IU]/mL (ref 0.450–4.500)

## 2020-02-04 LAB — CBC WITH DIFFERENTIAL/PLATELET
Basophils Absolute: 0.1 10*3/uL (ref 0.0–0.2)
Basos: 1 %
EOS (ABSOLUTE): 0.2 10*3/uL (ref 0.0–0.4)
Eos: 2 %
Hematocrit: 40.7 % (ref 34.0–46.6)
Hemoglobin: 13.6 g/dL (ref 11.1–15.9)
Immature Grans (Abs): 0 10*3/uL (ref 0.0–0.1)
Immature Granulocytes: 0 %
Lymphocytes Absolute: 2.7 10*3/uL (ref 0.7–3.1)
Lymphs: 34 %
MCH: 25.9 pg — ABNORMAL LOW (ref 26.6–33.0)
MCHC: 33.4 g/dL (ref 31.5–35.7)
MCV: 78 fL — ABNORMAL LOW (ref 79–97)
Monocytes Absolute: 0.6 10*3/uL (ref 0.1–0.9)
Monocytes: 8 %
Neutrophils Absolute: 4.4 10*3/uL (ref 1.4–7.0)
Neutrophils: 55 %
Platelets: 350 10*3/uL (ref 150–450)
RBC: 5.25 x10E6/uL (ref 3.77–5.28)
RDW: 16.9 % — ABNORMAL HIGH (ref 11.7–15.4)
WBC: 8 10*3/uL (ref 3.4–10.8)

## 2020-02-04 LAB — LIPID PANEL
Chol/HDL Ratio: 3.8 ratio (ref 0.0–4.4)
Cholesterol, Total: 187 mg/dL (ref 100–199)
HDL: 49 mg/dL (ref >39)
LDL Chol Calc (NIH): 106 mg/dL — ABNORMAL HIGH (ref 0–99)
Triglycerides: 185 mg/dL — ABNORMAL HIGH (ref 0–149)
VLDL Cholesterol Cal: 32 mg/dL (ref 5–40)

## 2020-02-04 LAB — VITAMIN B12: Vitamin B-12: 264 pg/mL (ref 232–1245)

## 2020-02-04 LAB — VITAMIN D 25 HYDROXY (VIT D DEFICIENCY, FRACTURES): Vit D, 25-Hydroxy: 17.8 ng/mL — ABNORMAL LOW (ref 30.0–100.0)

## 2020-02-04 MED ORDER — VITAMIN D (ERGOCALCIFEROL) 1.25 MG (50000 UNIT) PO CAPS: 50000.0000 [IU] | ORAL_CAPSULE | ORAL | 6 refills | Status: DC

## 2020-02-13 ENCOUNTER — Other Ambulatory Visit: Payer: Self-pay | Admitting: Family Medicine

## 2020-02-13 DIAGNOSIS — I10 Essential (primary) hypertension: Secondary | ICD-10-CM

## 2020-02-16 ENCOUNTER — Other Ambulatory Visit: Payer: Self-pay | Admitting: Family Medicine

## 2020-02-16 DIAGNOSIS — E119 Type 2 diabetes mellitus without complications: Secondary | ICD-10-CM

## 2020-03-02 ENCOUNTER — Other Ambulatory Visit: Payer: Self-pay | Admitting: Family Medicine

## 2020-03-02 DIAGNOSIS — R609 Edema, unspecified: Secondary | ICD-10-CM

## 2020-03-28 ENCOUNTER — Other Ambulatory Visit: Payer: Self-pay | Admitting: Family Medicine

## 2020-03-28 DIAGNOSIS — R609 Edema, unspecified: Secondary | ICD-10-CM

## 2020-04-22 ENCOUNTER — Other Ambulatory Visit: Payer: Self-pay | Admitting: Family Medicine

## 2020-04-22 DIAGNOSIS — F419 Anxiety disorder, unspecified: Secondary | ICD-10-CM

## 2020-04-22 DIAGNOSIS — Z7189 Other specified counseling: Secondary | ICD-10-CM

## 2020-04-23 ENCOUNTER — Other Ambulatory Visit: Payer: Self-pay | Admitting: Family Medicine

## 2020-04-23 DIAGNOSIS — R609 Edema, unspecified: Secondary | ICD-10-CM

## 2020-04-23 NOTE — Telephone Encounter (Signed)
Please review

## 2020-05-05 ENCOUNTER — Encounter (HOSPITAL_COMMUNITY): Payer: Self-pay | Admitting: Emergency Medicine

## 2020-05-05 ENCOUNTER — Emergency Department (HOSPITAL_COMMUNITY)
Admission: EM | Admit: 2020-05-05 | Discharge: 2020-05-05 | Disposition: A | Discharge status: Home / Self Care | Payer: Medicare Other | Attending: Emergency Medicine | Admitting: Emergency Medicine

## 2020-05-05 ENCOUNTER — Other Ambulatory Visit: Payer: Self-pay

## 2020-05-05 ENCOUNTER — Encounter: Payer: Self-pay | Admitting: Family Medicine

## 2020-05-05 ENCOUNTER — Ambulatory Visit (INDEPENDENT_AMBULATORY_CARE_PROVIDER_SITE_OTHER): Payer: Medicare Other | Admitting: Family Medicine

## 2020-05-05 VITALS — BP 151/100 | HR 94 | Temp 97.0°F | Ht 63.0 in | Wt 237.0 lb

## 2020-05-05 DIAGNOSIS — G4700 Insomnia, unspecified: Secondary | ICD-10-CM | POA: Diagnosis not present

## 2020-05-05 DIAGNOSIS — F419 Anxiety disorder, unspecified: Secondary | ICD-10-CM

## 2020-05-05 DIAGNOSIS — I10 Essential (primary) hypertension: Secondary | ICD-10-CM | POA: Diagnosis present

## 2020-05-05 DIAGNOSIS — I160 Hypertensive urgency: Secondary | ICD-10-CM | POA: Diagnosis not present

## 2020-05-05 DIAGNOSIS — Z9181 History of falling: Secondary | ICD-10-CM

## 2020-05-05 DIAGNOSIS — Z5321 Procedure and treatment not carried out due to patient leaving prior to being seen by health care provider: Secondary | ICD-10-CM | POA: Insufficient documentation

## 2020-05-05 DIAGNOSIS — R82998 Other abnormal findings in urine: Secondary | ICD-10-CM

## 2020-05-05 DIAGNOSIS — N3000 Acute cystitis without hematuria: Secondary | ICD-10-CM

## 2020-05-05 DIAGNOSIS — Z09 Encounter for follow-up examination after completed treatment for conditions other than malignant neoplasm: Secondary | ICD-10-CM

## 2020-05-05 DIAGNOSIS — E119 Type 2 diabetes mellitus without complications: Secondary | ICD-10-CM

## 2020-05-05 DIAGNOSIS — R7309 Other abnormal glucose: Secondary | ICD-10-CM | POA: Diagnosis not present

## 2020-05-05 DIAGNOSIS — E66813 Obesity, class 3: Secondary | ICD-10-CM

## 2020-05-05 DIAGNOSIS — Z6841 Body Mass Index (BMI) 40.0 and over, adult: Secondary | ICD-10-CM

## 2020-05-05 LAB — POCT URINALYSIS DIPSTICK
Bilirubin, UA: NEGATIVE
Blood, UA: NEGATIVE
Glucose, UA: NEGATIVE
Ketones, UA: NEGATIVE
Nitrite, UA: NEGATIVE
Protein, UA: NEGATIVE
Spec Grav, UA: 1.02 (ref 1.010–1.025)
Urobilinogen, UA: 0.2 E.U./dL
pH, UA: 5.5 (ref 5.0–8.0)

## 2020-05-05 LAB — POCT GLYCOSYLATED HEMOGLOBIN (HGB A1C)
HbA1c POC (<> result, manual entry): 6.2 % (ref 4.0–5.6)
HbA1c, POC (controlled diabetic range): 6.2 % (ref 0.0–7.0)
HbA1c, POC (prediabetic range): 6.2 % (ref 5.7–6.4)
Hemoglobin A1C: 6.2 % — AB (ref 4.0–5.6)

## 2020-05-05 LAB — GLUCOSE, POCT (MANUAL RESULT ENTRY): POC Glucose: 132 mg/dl — AB (ref 70–99)

## 2020-05-05 MED ORDER — LOSARTAN POTASSIUM 50 MG PO TABS: 50.0000 mg | ORAL_TABLET | Freq: Two times a day (BID) | ORAL | 3 refills | Status: DC

## 2020-05-05 MED ORDER — SULFAMETHOXAZOLE-TRIMETHOPRIM 800-160 MG PO TABS: 1.0000 | ORAL_TABLET | Freq: Two times a day (BID) | ORAL | 0 refills | Status: DC

## 2020-05-05 MED ORDER — CLONIDINE HCL 0.1 MG PO TABS
0.1000 mg | ORAL_TABLET | Freq: Once | ORAL | Status: AC
  Administered 2020-05-05: 0.1 mg via ORAL

## 2020-05-05 MED ORDER — CLONIDINE HCL 0.1 MG PO TABS
0.2000 mg | ORAL_TABLET | Freq: Once | ORAL | Status: AC
  Administered 2020-05-05: 0.2 mg via ORAL

## 2020-05-05 NOTE — ED Triage Notes (Signed)
Patient here from home reporting hypertension all day. Seen at PCP for same. Reports that she was given 3 clonidine tablets with no relief. Reports that she is on Losartan.

## 2020-05-05 NOTE — Progress Notes (Signed)
Patient Sharon Chan   Established Patient Office Visit  Subjective:  Patient ID: MAXYNE DEROCHER, female    DOB: 11-25-1975  Age: 44 y.o. MRN: [id number]  CC:  Chief Complaint  Patient presents with  . Follow-up    3 month follow up;   Marland Kitchen Hypertension    "blood pressure is always high"; headache and blurry vision    HPI Sharon Chan is a 44 year old female who presents for Follow Up today.   Patient Active Problem List   Diagnosis Date Noted  . Hemoglobin A1c less than 7.0% 02/03/2020  . Hyperglycemia due to diabetes mellitus ([place]) 02/03/2020  . Right sided weakness 10/21/2019  . History of CVA with residual deficit 10/21/2019  . Peripheral neuropathy 07/19/2019  . Insomnia 07/19/2019  . Edema 07/19/2019  . Anxiety 07/19/2019  . Class 3 severe obesity due to excess calories with serious comorbidity and body mass index (BMI) of 40.0 to 44.9 in adult ([place]) 07/19/2019  . Shortness of breath on exertion 05/28/2018  . Type 2 diabetes mellitus without complication, without long-term current use of insulin ([place]) 05/28/2018  . Other fatigue 05/28/2018  . Essential hypertension 05/28/2018  . Vitamin D deficiency 05/28/2018  . B12 nutritional deficiency 05/28/2018  . Hemiparesis affecting right side as late effect of cerebrovascular accident ([place]) 03/23/2017  . Lacunar infarct, acute ([place]) 03/23/2017  . Visual disturbance as complication of stroke 30/86/5784  . Adjustment disorder with anxious mood   . 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 Sickle cell trait ([place])   . Hyperlipidemia   . Thyroid nodule   . Changes in vision   . Acute ischemic stroke ([place])   . Dysphagia, post-stroke   . Benign essential HTN   . Polycystic kidney disease   . Prediabetes   . Acute intractable headache   . Leukocytosis   . Stroke (cerebrum) ([place]) 02/25/2017    Past Medical History:  Diagnosis Date  .  Anxiety   . CVA (cerebral vascular accident) ([place])   . Depression   . DM type 2 (diabetes mellitus, type 2) ([place])   . HLD (hyperlipidemia)   . HTN (hypertension)   . Joint pain   . Migraines   . Neck pain   . Obese   . Polycystic disease, ovaries   . Right sided weakness   . Seizures ([place])   . Shoulder pain   . Sickle cell trait ([place])   . Thyroid nodule   . Vitamin D deficiency 01/2020    Current Status: Since her last office visit she has c/o frequent headaches, blurry vision, and dizziness with position changes X about 6 months with blurred vision beginning 1 month ago. Denies severe headaches, confusion, seizures, double vision, and blurred vision, nausea and vomiting. Her blood pressures are elevated today. Her most recent normal range of preprandial blood glucose levels have been between 100-110. She has seen low range of 75 and high of 128 since his last office visit. She denies fatigue, frequent urination, excessive hunger, excessive thirst, weight gain, weight loss, and poor wound healing. She continues to check her feet regularly. She denies visual changes, chest pain, cough, shortness of breath, heart palpitations, and falls. She denies fevers, chills, recent infections, weight loss, and night sweats. Denies GI problems such as diarrhea, and constipation. She has no reports of blood in stools, dysuria and hematuria. Anxiety is moderate today. She denies  suicidal ideations, homicidal ideations, or auditory hallucinations. She is taking all medications as prescribed. She denies pain today.   Past Surgical History:  Procedure Laterality Date  . LAPAROSCOPIC CHOLECYSTECTOMY      Family History  Problem Relation Age of Onset  . Heart attack Mother   . Diabetes Mother   . Hypertension Mother   . Hyperlipidemia Mother   . Post-traumatic stress disorder Father        committed suicide  . Diabetes Father   . Hypertension Sister   . Thyroid cancer Paternal Grandmother      Social History   Socioeconomic History  . Marital status: Single    Spouse name: Not on file  . Number of children: 0  . Years of education: Not on file  . Highest education level: Not on file  Occupational History  . Not on file  Tobacco Use  . Smoking status: Never Smoker  . Smokeless tobacco: Never Used  Vaping Use  . Vaping Use: Never used  Substance and Sexual Activity  . Alcohol use: No  . Drug use: No  . Sexual activity: Not Currently  Other Topics Concern  . Not on file  Social History Narrative   Lives at home, mom currently staying with her   Left-handed   Caffeine: occasional decaf coffee or raspberry tea   Social Determinants of Health   Financial Resource Strain:   . Difficulty of Paying Living Expenses:   Food Insecurity:   . Worried About Charity fundraiser in the Last Year:   . Arboriculturist in the Last Year:   Transportation Needs:   . Film/video editor (Medical):   Marland Kitchen Lack of Transportation (Non-Medical):   Physical Activity:   . Days of Exercise per Week:   . Minutes of Exercise per Session:   Stress:   . Feeling of Stress :   Social Connections:   . Frequency of Communication with Friends and Family:   . Frequency of Social Gatherings with Friends and Family:   . Attends Religious Services:   . Active Member of Clubs or Organizations:   . Attends Archivist Meetings:   Marland Kitchen Marital Status:   Intimate Partner Violence:   . Fear of Current or Ex-Partner:   . Emotionally Abused:   Marland Kitchen Physically Abused:   . Sexually Abused:     Outpatient Medications Prior to Visit  Medication Sig Dispense Refill  . acetaminophen (TYLENOL) 325 MG tablet Take 1-2 tablets (325-650 mg total) by mouth every 4 (four) hours as needed for mild pain.    Marland Kitchen albuterol (PROVENTIL HFA;VENTOLIN HFA) 108 (90 Base) MCG/ACT inhaler Inhale 2 puffs into the lungs every 6 (six) hours as needed for wheezing or shortness of breath. 1 Inhaler 11  . atorvastatin  (LIPITOR) 40 MG tablet TAKE 1 TABLET (40 MG TOTAL) BY MOUTH DAILY AT 6 PM. 90 tablet 2  . blood glucose meter kit and supplies KIT Dispense based on patient and insurance preference. Use up to four times daily as directed. (FOR ICD-9 250.00, 250.01). 1 each 0  . buPROPion (WELLBUTRIN SR) 200 MG 12 hr tablet TAKE 1 TABLET BY MOUTH TWICE A DAY 180 tablet 2  . cyclobenzaprine (FLEXERIL) 5 MG tablet TAKE 1 TABLET 3 TIMES A DAY AS NEEDED FOR MUSCLE SPASM SHOULDER/NECK PAIN 90 tablet 6  . diphenhydrAMINE (BENADRYL ALLERGY) 25 MG tablet Take 25 mg by mouth every 6 (six) hours as needed.    Marland Kitchen  DULoxetine (CYMBALTA) 60 MG capsule TAKE 1 CAPSULE BY MOUTH EVERY DAY 90 capsule 1  . furosemide (LASIX) 20 MG tablet TAKE 1 TABLET BY MOUTH EVERY DAY 90 tablet 1  . gabapentin (NEURONTIN) 600 MG tablet Take 1 tablet (600 mg total) by mouth 3 (three) times daily. 90 tablet 3  . glucose blood (ONE TOUCH ULTRA TEST) test strip TEST TWICE A DAY 100 each 0  . Insulin Pen Needle (BD PEN NEEDLE NANO U/F) 32G X 4 MM MISC Inject 1 Units into the skin 2 (two) times daily. 100 each 12  . metFORMIN (GLUCOPHAGE) 500 MG tablet Take 1 tablet (500 mg total) by mouth 2 (two) times daily with a meal. 180 tablet 3  . OneTouch Delica Lancets 45Y MISC TES 2 (TWO) TIMES DAILY. 100 each 5  . topiramate (TOPAMAX) 100 MG tablet TAKE 1 TABLET IN MORNING AND 2 TABLETS AT NIGHT 20 tablet 0  . VICTOZA 18 MG/3ML SOPN INJECT 0.2 MLS (1.2 MG TOTAL) INTO THE SKIN EVERY MORNING. 6 pen 6  . Vitamin D, Ergocalciferol, (DRISDOL) 1.25 MG (50000 UNIT) CAPS capsule Take 1 capsule (50,000 Units total) by mouth every 7 (seven) days. 4 capsule 6  . zolpidem (AMBIEN) 5 MG tablet TAKE 1 TABLET BY MOUTH EVERY DAY AT BEDTIME AS NEEDED FOR SLEEP 30 tablet 0  . losartan (COZAAR) 50 MG tablet TAKE 1 TABLET BY MOUTH EVERY DAY 90 tablet 3   No facility-administered medications prior to visit.    Allergies  Allergen Reactions  . Penicillins Anaphylaxis     ROS Review of Systems  Constitutional: Negative.   HENT: Negative.   Eyes: Negative.   Respiratory: Negative.   Cardiovascular: Negative.   Gastrointestinal: Positive for abdominal distention.  Endocrine: Negative.   Genitourinary: Negative.   Musculoskeletal: Positive for arthralgias (generalized joint pain).  Skin: Negative.   Allergic/Immunologic: Negative.   Neurological: Positive for dizziness (occasional ), weakness (occasional ), light-headedness (occasional ) and headaches (occasional ).       Recent falls  Hematological: Negative.   Psychiatric/Behavioral: Negative.       Objective:    Physical Exam Vitals and nursing note reviewed.  Constitutional:      Appearance: Normal appearance.  HENT:     Head: Normocephalic and atraumatic.     Nose: Nose normal.     Mouth/Throat:     Mouth: Mucous membranes are moist.  Cardiovascular:     Rate and Rhythm: Normal rate and regular rhythm.     Pulses: Normal pulses.     Heart sounds: Normal heart sounds.  Pulmonary:     Effort: Pulmonary effort is normal.     Breath sounds: Normal breath sounds.  Abdominal:     General: Bowel sounds are normal. There is distension (obese).     Palpations: Abdomen is soft.  Musculoskeletal:        General: Normal range of motion.     Cervical back: Normal range of motion and neck supple.  Skin:    General: Skin is warm and dry.  Neurological:     General: No focal deficit present.     Mental Status: She is alert and oriented to person, place, and time.  Psychiatric:        Mood and Affect: Mood normal.        Behavior: Behavior normal.        Thought Content: Thought content normal.        Judgment: Judgment normal.  BP (!) 160/117   Pulse (!) 101   Temp (!) 97 F (36.1 C)   Ht 5' 3"  (1.6 m)   Wt 237 lb (107.5 kg)   LMP 04/14/2020   SpO2 100%   BMI 41.98 kg/m  Wt Readings from Last 3 Encounters:  05/05/20 237 lb (107.5 kg)  02/03/20 232 lb 9.6 oz (105.5 kg)   11/20/19 231 lb 3.2 oz (104.9 kg)     Health Maintenance Due  Topic Date Due  . Hepatitis C Screening  Never done  . PNEUMOCOCCAL POLYSACCHARIDE VACCINE AGE 73-64 HIGH RISK  Never done  . OPHTHALMOLOGY EXAM  Never done  . COVID-19 Vaccine (1) Never done  . PAP SMEAR-Modifier  Never done  . FOOT EXAM  01/02/2020    There are no preventive Chan reminders to display for this patient.  Lab Results  Component Value Date   TSH 0.747 02/03/2020   Lab Results  Component Value Date   WBC 8.0 02/03/2020   HGB 13.6 02/03/2020   HCT 40.7 02/03/2020   MCV 78 (L) 02/03/2020   PLT 350 02/03/2020   Lab Results  Component Value Date   NA 144 02/03/2020   K 3.8 02/03/2020   CO2 26 02/03/2020   GLUCOSE 132 (H) 02/03/2020   BUN 12 02/03/2020   CREATININE 0.95 02/03/2020   BILITOT 0.2 02/03/2020   ALKPHOS 97 02/03/2020   AST 11 02/03/2020   ALT 12 02/03/2020   PROT 6.7 02/03/2020   ALBUMIN 4.1 02/03/2020   CALCIUM 9.5 02/03/2020   ANIONGAP 8 03/08/2017   Lab Results  Component Value Date   CHOL 187 02/03/2020   Lab Results  Component Value Date   HDL 49 02/03/2020   Lab Results  Component Value Date   LDLCALC 106 (H) 02/03/2020   Lab Results  Component Value Date   TRIG 185 (H) 02/03/2020   Lab Results  Component Value Date   CHOLHDL 3.8 02/03/2020   Lab Results  Component Value Date   HGBA1C 6.2 (A) 05/05/2020   HGBA1C 6.2 05/05/2020   HGBA1C 6.2 05/05/2020   HGBA1C 6.2 05/05/2020      Assessment & Plan:   1. Hypertensive urgency Blood pressures are elevated today. Clonidine 0.3 mg given to patient in office and blood pressures remain elevated. We referred her to ED via ambulance at this time and states that she will drive there herself. Patient refused and signed AMA form at discharge. She denies severe headaches, confusion, seizures, double vision, and blurred vision, nausea and vomiting. She will report to ED if she experiences these symptoms. She will  continue to take medications as prescribed, to decrease high sodium intake, excessive alcohol intake, increase potassium intake, smoking cessation, and increase physical activity of at least 30 minutes of cardio activity daily. She will continue to follow Heart Healthy or DASH diet. Patient verbalized understanding.   - Urinalysis Dipstick - losartan (COZAAR) 50 MG tablet; Take 1 tablet (50 mg total) by mouth in the morning and at bedtime.  Dispense: 60 tablet; Refill: 3  2. Type 2 diabetes mellitus without complication, without long-term current use of insulin (HCC) She will continue medication as prescribed, to decrease foods/beverages high in sugars and carbs and follow Heart Healthy or DASH diet. Increase physical activity to at least 30 minutes cardio exercise daily.  - Urinalysis Dipstick - HgB A1c - Glucose (CBG)  3. Hemoglobin A1c less than 7.0% Hgb A1c stable at 6.2 today. Monitor.   4.  Insomnia, unspecified type  5. Anxiety Moderate today.   6. History of recent fall She will ambulate slowly when changing positions. Monitor.   7. Class 3 severe obesity due to excess calories with serious comorbidity and body mass index (BMI) of 40.0 to 44.9 in adult [facility]) Body mass index is 41.98 kg/m. Goal BMI  is <30. Encouraged efforts to reduce weight include engaging in physical activity as tolerated with goal of 150 minutes per week. Improve dietary choices and eat a meal regimen consistent with a Mediterranean or DASH diet. Reduce simple carbohydrates. Do not skip meals and eat healthy snacks throughout the day to avoid over-eating at dinner. Set a goal weight loss that is achievable for you.  8. Urine white blood cells increased Results of urine culture is pending. - Urine Culture  9. Acute cystitis without hematuria We will initiate Bactrim today.  - sulfamethoxazole-trimethoprim (BACTRIM DS) 800-160 MG tablet; Take 1 tablet by mouth 2 (two) times daily.  Dispense: 14 tablet; Refill:  0  10. Follow up She will follow up in 1 month.  She will also follow up asap with Neurology.   Meds ordered this encounter  Medications  . sulfamethoxazole-trimethoprim (BACTRIM DS) 800-160 MG tablet    Sig: Take 1 tablet by mouth 2 (two) times daily.    Dispense:  14 tablet    Refill:  0  . losartan (COZAAR) 50 MG tablet    Sig: Take 1 tablet (50 mg total) by mouth in the morning and at bedtime.    Dispense:  60 tablet    Refill:  3    Orders Placed This Encounter  Procedures  . Urine Culture  . Urinalysis Dipstick  . HgB A1c  . Glucose (CBG)    Referral Orders  No referral(s) requested today    Kathe Becton,  MSN, FNP-BC [address] [place], [place] [address] [phone]- fax  Problem List Items Addressed This Visit      Cardiovascular and Mediastinum   Essential hypertension - Primary   Relevant Medications   losartan (COZAAR) 50 MG tablet   Other Relevant Orders   Urinalysis Dipstick (Completed)     Endocrine   Type 2 diabetes mellitus without complication, without long-term current use of insulin (HCC)   Relevant Medications   losartan (COZAAR) 50 MG tablet   Other Relevant Orders   Urinalysis Dipstick (Completed)   HgB A1c (Completed)   Glucose (CBG) (Completed)     Other   Anxiety   Class 3 severe obesity due to excess calories with serious comorbidity and body mass index (BMI) of 40.0 to 44.9 in adult (HCC)   Hemoglobin A1c less than 7.0%   Insomnia    Other Visit Diagnoses    History of recent fall       Urine white blood cells increased       Relevant Orders   Urine Culture   Acute cystitis without hematuria       Relevant Medications   sulfamethoxazole-trimethoprim (BACTRIM DS) 800-160 MG tablet   Follow up          Meds ordered this encounter  Medications  . sulfamethoxazole-trimethoprim (BACTRIM DS) 800-160 MG  tablet    Sig: Take 1 tablet by mouth 2 (two) times daily.    Dispense:  14 tablet    Refill:  0  . losartan (COZAAR) 50 MG tablet    Sig: Take  1 tablet (50 mg total) by mouth in the morning and at bedtime.    Dispense:  60 tablet    Refill:  3    Follow-up: Return in about 6 months (around 11/04/2020).    Azzie Glatter, FNP

## 2020-05-06 DIAGNOSIS — Z9181 History of falling: Secondary | ICD-10-CM | POA: Insufficient documentation

## 2020-05-07 LAB — URINE CULTURE

## 2020-05-15 ENCOUNTER — Other Ambulatory Visit: Payer: Self-pay | Admitting: Family Medicine

## 2020-05-15 DIAGNOSIS — R609 Edema, unspecified: Secondary | ICD-10-CM

## 2020-05-16 ENCOUNTER — Other Ambulatory Visit: Payer: Self-pay | Admitting: Internal Medicine

## 2020-05-16 DIAGNOSIS — F419 Anxiety disorder, unspecified: Secondary | ICD-10-CM

## 2020-05-23 ENCOUNTER — Other Ambulatory Visit: Payer: Self-pay | Admitting: Family Medicine

## 2020-05-23 DIAGNOSIS — F419 Anxiety disorder, unspecified: Secondary | ICD-10-CM

## 2020-05-23 DIAGNOSIS — Z7189 Other specified counseling: Secondary | ICD-10-CM

## 2020-05-23 DIAGNOSIS — G4700 Insomnia, unspecified: Secondary | ICD-10-CM

## 2020-05-25 NOTE — Telephone Encounter (Signed)
Please review  Thank you

## 2020-06-03 ENCOUNTER — Other Ambulatory Visit: Payer: Self-pay

## 2020-06-03 ENCOUNTER — Ambulatory Visit (HOSPITAL_COMMUNITY)
Admission: RE | Admit: 2020-06-03 | Discharge: 2020-06-03 | Disposition: A | Discharge status: Home / Self Care | Payer: Medicare Other | Source: Ambulatory Visit | Attending: Internal Medicine | Admitting: Internal Medicine

## 2020-06-03 ENCOUNTER — Ambulatory Visit (INDEPENDENT_AMBULATORY_CARE_PROVIDER_SITE_OTHER): Payer: Medicare Other | Admitting: Family Medicine

## 2020-06-03 ENCOUNTER — Encounter: Payer: Self-pay | Admitting: Family Medicine

## 2020-06-03 VITALS — BP 158/106 | HR 110 | Temp 98.2°F | Resp 20 | Ht 63.0 in | Wt 235.6 lb

## 2020-06-03 DIAGNOSIS — R739 Hyperglycemia, unspecified: Secondary | ICD-10-CM

## 2020-06-03 DIAGNOSIS — E119 Type 2 diabetes mellitus without complications: Secondary | ICD-10-CM

## 2020-06-03 DIAGNOSIS — R531 Weakness: Secondary | ICD-10-CM | POA: Diagnosis not present

## 2020-06-03 DIAGNOSIS — G4700 Insomnia, unspecified: Secondary | ICD-10-CM | POA: Diagnosis not present

## 2020-06-03 DIAGNOSIS — I160 Hypertensive urgency: Secondary | ICD-10-CM

## 2020-06-03 DIAGNOSIS — E860 Dehydration: Secondary | ICD-10-CM

## 2020-06-03 DIAGNOSIS — Z9181 History of falling: Secondary | ICD-10-CM

## 2020-06-03 DIAGNOSIS — I6930 Unspecified sequelae of cerebral infarction: Secondary | ICD-10-CM | POA: Diagnosis not present

## 2020-06-03 DIAGNOSIS — Z09 Encounter for follow-up examination after completed treatment for conditions other than malignant neoplasm: Secondary | ICD-10-CM

## 2020-06-03 DIAGNOSIS — F419 Anxiety disorder, unspecified: Secondary | ICD-10-CM | POA: Diagnosis not present

## 2020-06-03 DIAGNOSIS — R7309 Other abnormal glucose: Secondary | ICD-10-CM | POA: Diagnosis not present

## 2020-06-03 MED ORDER — SODIUM CHLORIDE 0.9 % IV SOLN: INTRAVENOUS | Status: DC

## 2020-06-03 MED ORDER — SODIUM CHLORIDE 0.9 % IV SOLN: Freq: Once | INTRAVENOUS | Status: AC

## 2020-06-03 MED ORDER — CLONIDINE HCL 0.2 MG PO TABS
0.2000 mg | ORAL_TABLET | Freq: Once | ORAL | Status: AC
  Administered 2020-06-03: 0.2 mg via ORAL

## 2020-06-03 NOTE — Progress Notes (Signed)
Patient [facility]   Established Patient Office Visit  Subjective:  Patient ID: Sharon Chan, female    DOB: 04/15/1976  Age: 44 y.o. MRN: [id number]  CC:  Chief Complaint  Patient presents with  . Follow-up    Pt states she is here for a f/u. Pt states she wants to discuss her BP.    HPI Sharon Chan is a 44 year old female who presents for Follow Up today.    Patient Active Problem List   Diagnosis Date Noted  . History of recent fall 05/06/2020  . Hemoglobin A1c less than 7.0% 02/03/2020  . Hyperglycemia due to diabetes mellitus ([place]) 02/03/2020  . Right sided weakness 10/21/2019  . History of CVA with residual deficit 10/21/2019  . Peripheral neuropathy 07/19/2019  . Insomnia 07/19/2019  . Edema 07/19/2019  . Anxiety 07/19/2019  . Class 3 severe obesity due to excess calories with serious comorbidity and body mass index (BMI) of 40.0 to 44.9 in adult ([place]) 07/19/2019  . Shortness of breath on exertion 05/28/2018  . Type 2 diabetes mellitus without complication, without long-term current use of insulin ([place]) 05/28/2018  . Other fatigue 05/28/2018  . Essential hypertension 05/28/2018  . Vitamin D deficiency 05/28/2018  . B12 nutritional deficiency 05/28/2018  . Hemiparesis affecting right side as late effect of cerebrovascular accident ([place]) 03/23/2017  . Lacunar infarct, acute ([place]) 03/23/2017  . Visual disturbance as complication of stroke 26/20/3559  . Adjustment disorder with anxious mood   . 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 Sickle cell trait ([place])   . Hyperlipidemia   . Thyroid nodule   . Changes in vision   . Acute ischemic stroke ([place])   . Dysphagia, post-stroke   . Benign essential HTN   . Polycystic kidney disease   . Prediabetes   . Acute intractable headache   . Leukocytosis   . Stroke (cerebrum) ([place]) 02/25/2017      Past Medical History:    Diagnosis Date  . Anxiety   . CVA (cerebral vascular accident) ([place])   . Depression   . DM type 2 (diabetes mellitus, type 2) ([place])   . HLD (hyperlipidemia)   . HTN (hypertension)   . Joint pain   . Migraines   . Neck pain   . Obese   . Polycystic disease, ovaries   . Right sided weakness   . Seizures ([place])   . Shoulder pain   . Sickle cell trait ([place])   . Thyroid nodule   . Vitamin D deficiency 01/2020   Current Status: Since her last office visit, she is doing well with no complaints. Her blood pressure levels are elevated today. She reports that blood pressures are higher when she is home. She does report a recent fall, which she denies any injuries. She denies visual changes, chest pain, cough, shortness of breath, heart palpitations. She has occasional headaches and dizziness with position changes. Denies severe headaches, confusion, seizures, double vision, and blurred vision, nausea and vomiting. She has not been monitoring her blood glucose levels lately. She denies fatigue, frequent urination, blurred vision, excessive hunger, excessive thirst, weight gain, weight loss, and poor wound healing. She continues to check her feet regularly. She denies fevers, chills, recent infections, weight loss, and night sweats.  Denies GI problems such as diarrhea, and constipation. She has no reports of blood in stools, dysuria and hematuria. Her anxiety it  moderate today r/t her healthcare status. She denies suicidal ideations, homicidal ideations, or auditory hallucinations. She is taking all medications as prescribed. She has moderate generalized pain today.   Past Surgical History:  Procedure Laterality Date  . LAPAROSCOPIC CHOLECYSTECTOMY      Family History  Problem Relation Age of Onset  . Heart attack Mother   . Diabetes Mother   . Hypertension Mother   . Hyperlipidemia Mother   . Post-traumatic stress disorder Father        committed suicide  . Diabetes Father   . Hypertension  Sister   . Thyroid cancer Paternal Grandmother     Social History   Socioeconomic History  . Marital status: Single    Spouse name: Not on file  . Number of children: 0  . Years of education: Not on file  . Highest education level: Not on file  Occupational History  . Not on file  Tobacco Use  . Smoking status: Never Smoker  . Smokeless tobacco: Never Used  Vaping Use  . Vaping Use: Never used  Substance and Sexual Activity  . Alcohol use: No  . Drug use: No  . Sexual activity: Not Currently  Other Topics Concern  . Not on file  Social History Narrative   Lives at home, mom currently staying with her   Left-handed   Caffeine: occasional decaf coffee or raspberry tea   Social Determinants of Health   Financial Resource Strain:   . Difficulty of Paying Living Expenses:   Food Insecurity:   . Worried About Charity fundraiser in the Last Year:   . Arboriculturist in the Last Year:   Transportation Needs:   . Film/video editor (Medical):   Marland Kitchen Lack of Transportation (Non-Medical):   Physical Activity:   . Days of Exercise per Week:   . Minutes of Exercise per Session:   Stress:   . Feeling of Stress :   Social Connections:   . Frequency of Communication with Friends and Family:   . Frequency of Social Gatherings with Friends and Family:   . Attends Religious Services:   . Active Member of Clubs or Organizations:   . Attends Archivist Meetings:   Marland Kitchen Marital Status:   Intimate Partner Violence:   . Fear of Current or Ex-Partner:   . Emotionally Abused:   Marland Kitchen Physically Abused:   . Sexually Abused:     Outpatient Medications Prior to Visit  Medication Sig Dispense Refill  . acetaminophen (TYLENOL) 325 MG tablet Take 1-2 tablets (325-650 mg total) by mouth every 4 (four) hours as needed for mild pain.    Marland Kitchen albuterol (PROVENTIL HFA;VENTOLIN HFA) 108 (90 Base) MCG/ACT inhaler Inhale 2 puffs into the lungs every 6 (six) hours as needed for wheezing or  shortness of breath. 1 Inhaler 11  . atorvastatin (LIPITOR) 40 MG tablet TAKE 1 TABLET (40 MG TOTAL) BY MOUTH DAILY AT 6 PM. 90 tablet 2  . blood glucose meter kit and supplies KIT Dispense based on patient and insurance preference. Use up to four times daily as directed. (FOR ICD-9 250.00, 250.01). 1 each 0  . buPROPion (WELLBUTRIN SR) 200 MG 12 hr tablet TAKE 1 TABLET BY MOUTH TWICE A DAY 180 tablet 3  . cyclobenzaprine (FLEXERIL) 5 MG tablet TAKE 1 TABLET 3 TIMES A DAY AS NEEDED FOR MUSCLE SPASM SHOULDER/NECK PAIN 90 tablet 6  . diphenhydrAMINE (BENADRYL ALLERGY) 25 MG tablet Take 25 mg by mouth  every 6 (six) hours as needed.    . DULoxetine (CYMBALTA) 60 MG capsule TAKE 1 CAPSULE BY MOUTH EVERY DAY 90 capsule 1  . furosemide (LASIX) 20 MG tablet TAKE 1 TABLET BY MOUTH EVERY DAY 90 tablet 1  . gabapentin (NEURONTIN) 600 MG tablet Take 1 tablet (600 mg total) by mouth 3 (three) times daily. 90 tablet 3  . glucose blood (ONE TOUCH ULTRA TEST) test strip TEST TWICE A DAY 100 each 0  . Insulin Pen Needle (BD PEN NEEDLE NANO U/F) 32G X 4 MM MISC Inject 1 Units into the skin 2 (two) times daily. 100 each 12  . losartan (COZAAR) 50 MG tablet Take 1 tablet (50 mg total) by mouth in the morning and at bedtime. 60 tablet 3  . OneTouch Delica Lancets 77N MISC TES 2 (TWO) TIMES DAILY. 100 each 5  . sulfamethoxazole-trimethoprim (BACTRIM DS) 800-160 MG tablet Take 1 tablet by mouth 2 (two) times daily. 14 tablet 0  . topiramate (TOPAMAX) 100 MG tablet TAKE 1 TABLET IN MORNING AND 2 TABLETS AT NIGHT 20 tablet 0  . VICTOZA 18 MG/3ML SOPN INJECT 0.2 MLS (1.2 MG TOTAL) INTO THE SKIN EVERY MORNING. 6 pen 6  . Vitamin D, Ergocalciferol, (DRISDOL) 1.25 MG (50000 UNIT) CAPS capsule Take 1 capsule (50,000 Units total) by mouth every 7 (seven) days. 4 capsule 6  . zolpidem (AMBIEN) 5 MG tablet TAKE 1 TABLET BY MOUTH AT BEDTIME AS NEEDED FOR SLEEP 30 tablet 0  . metFORMIN (GLUCOPHAGE) 500 MG tablet Take 1 tablet (500  mg total) by mouth 2 (two) times daily with a meal. 180 tablet 3   No facility-administered medications prior to visit.    Allergies  Allergen Reactions  . Penicillins Anaphylaxis    ROS Review of Systems  Constitutional: Negative.   HENT: Negative.   Eyes: Negative.   Respiratory: Negative.   Cardiovascular: Negative.   Gastrointestinal: Positive for abdominal distention.  Endocrine: Negative.   Genitourinary: Negative.   Musculoskeletal: Positive for arthralgias (generalized joint pain).  Skin: Negative.   Allergic/Immunologic: Negative.   Neurological: Positive for dizziness (occasional ), weakness (residual right sided weakness r/t history of stroke. ) and headaches (occasional ).  Hematological: Negative.   Psychiatric/Behavioral: The patient is nervous/anxious.       Objective:    Physical Exam Vitals and nursing note reviewed.  Constitutional:      Appearance: Normal appearance. She is obese.  HENT:     Head: Normocephalic and atraumatic.     Nose: Nose normal.     Mouth/Throat:     Mouth: Mucous membranes are moist.     Pharynx: Oropharynx is clear.  Cardiovascular:     Rate and Rhythm: Normal rate and regular rhythm.     Pulses: Normal pulses.     Heart sounds: Normal heart sounds.  Pulmonary:     Effort: Pulmonary effort is normal.     Breath sounds: Normal breath sounds.  Abdominal:     General: Bowel sounds are normal. There is distension (obese).     Palpations: Abdomen is soft.  Musculoskeletal:        General: Normal range of motion.     Cervical back: Normal range of motion and neck supple.  Skin:    General: Skin is warm and dry.  Neurological:     Mental Status: She is alert and oriented to person, place, and time.     Motor: Weakness (residual right sided weakness r/t stroke) present.  Psychiatric:        Mood and Affect: Mood normal.        Behavior: Behavior normal.        Thought Content: Thought content normal.        Judgment:  Judgment normal.     BP (!) 158/106   Pulse (!) 110   Temp 98.2 F (36.8 C)   Resp 20   Ht 5' 3"  (1.6 m)   Wt 235 lb 9.6 oz (106.9 kg)   LMP 06/03/2020 (Exact Date)   SpO2 99%   BMI 41.73 kg/m  Wt Readings from Last 3 Encounters:  06/03/20 235 lb 9.6 oz (106.9 kg)  05/05/20 237 lb (107.5 kg)  02/03/20 232 lb 9.6 oz (105.5 kg)     Health Maintenance Due  Topic Date Due  . Hepatitis C Screening  Never done  . PNEUMOCOCCAL POLYSACCHARIDE VACCINE AGE 38-64 HIGH RISK  Never done  . OPHTHALMOLOGY EXAM  Never done  . COVID-19 Vaccine (1) Never done  . PAP SMEAR-Modifier  Never done  . FOOT EXAM  01/02/2020    There are no preventive care reminders to display for this patient.  Lab Results  Component Value Date   TSH 0.747 02/03/2020   Lab Results  Component Value Date   WBC 8.0 02/03/2020   HGB 13.6 02/03/2020   HCT 40.7 02/03/2020   MCV 78 (L) 02/03/2020   PLT 350 02/03/2020   Lab Results  Component Value Date   NA 144 02/03/2020   K 3.8 02/03/2020   CO2 26 02/03/2020   GLUCOSE 132 (H) 02/03/2020   BUN 12 02/03/2020   CREATININE 0.95 02/03/2020   BILITOT 0.2 02/03/2020   ALKPHOS 97 02/03/2020   AST 11 02/03/2020   ALT 12 02/03/2020   PROT 6.7 02/03/2020   ALBUMIN 4.1 02/03/2020   CALCIUM 9.5 02/03/2020   ANIONGAP 8 03/08/2017   Lab Results  Component Value Date   CHOL 187 02/03/2020   Lab Results  Component Value Date   HDL 49 02/03/2020   Lab Results  Component Value Date   LDLCALC 106 (H) 02/03/2020   Lab Results  Component Value Date   TRIG 185 (H) 02/03/2020   Lab Results  Component Value Date   CHOLHDL 3.8 02/03/2020   Lab Results  Component Value Date   HGBA1C 6.2 (A) 05/05/2020   HGBA1C 6.2 05/05/2020   HGBA1C 6.2 05/05/2020   HGBA1C 6.2 05/05/2020      Assessment & Plan:   1. Hypertensive urgency We will refer patient to [facility] for IVFs for hydration to decrease blood pressure. Blood pressures are elevated today.  Clonidine 0.3 mg given to patient in office and blood pressures remain elevated. She denies severe headaches, confusion, seizures, double vision, and blurred vision, nausea and vomiting. She will report to ED if she experiences these symptoms. Patient verbalized understanding.   - cloNIDine (CATAPRES) tablet 0.2 mg  2. Type 2 diabetes mellitus without complication, without long-term current use of insulin (HCC) She will continue medication as prescribed, to decrease foods/beverages high in sugars and carbs and follow Heart Healthy or DASH diet. Increase physical activity to at least 30 minutes cardio exercise daily.   3. Hemoglobin A1c less than 7.0% Most recent Hgb A1c is stable at 6.3. monitor.   4. Hyperglycemia  5. History of CVA with residual deficit No signs or symptoms of recurrence noted or reported today.   6. Right sided weakness Stable. Not worsening.  7. History of recent fall She will change positions slowly, when ambulating.   8. Anxiety   9. Insomnia, unspecified type  10. Dehydration  11. Follow up She will follow up in 1 month.   Meds ordered this encounter  Medications  . cloNIDine (CATAPRES) tablet 0.2 mg    No orders of the defined types were placed in this encounter.   Referral Orders  No referral(s) requested today    Kathe Becton,  MSN, FNP-BC [facility] [address] [place], [place] [address] [phone]- fax   Problem List Items Addressed This Visit      Endocrine   Type 2 diabetes mellitus without complication, without long-term current use of insulin (HCC)     Nervous and Auditory   Right sided weakness     Other   Anxiety   Hemoglobin A1c less than 7.0%   History of CVA with residual deficit   History of recent fall   Insomnia    Other Visit Diagnoses    Hypertensive urgency    -  Primary   Relevant Medications    cloNIDine (CATAPRES) tablet 0.2 mg (Completed)   Hyperglycemia       Dehydration       Follow up          Meds ordered this encounter  Medications  . cloNIDine (CATAPRES) tablet 0.2 mg    Follow-up: No follow-ups on file.    Azzie Glatter, FNP

## 2020-06-03 NOTE — Progress Notes (Signed)
Pt was sent over from the Day clinic by Raliegh Ip to receive one liter of fluid. Vital signs were taken and an IV was started.  Once fluids were finished vital signs were taken again and called to the provider. Provider came over spoke to the pt and said pt could be discharged.  IV was removed and discharge papers were given.  Pt verbalized understanding. Pt was ambulatory when discharged.

## 2020-06-03 NOTE — Discharge Instructions (Signed)

## 2020-06-12 DIAGNOSIS — E860 Dehydration: Secondary | ICD-10-CM | POA: Insufficient documentation

## 2020-06-12 DIAGNOSIS — I160 Hypertensive urgency: Secondary | ICD-10-CM | POA: Insufficient documentation

## 2020-06-18 ENCOUNTER — Encounter: Payer: Self-pay | Admitting: Family Medicine

## 2020-06-19 ENCOUNTER — Other Ambulatory Visit: Payer: Self-pay | Admitting: Family Medicine

## 2020-06-19 DIAGNOSIS — R609 Edema, unspecified: Secondary | ICD-10-CM

## 2020-06-26 ENCOUNTER — Other Ambulatory Visit: Payer: Self-pay | Admitting: Family Medicine

## 2020-06-26 DIAGNOSIS — F419 Anxiety disorder, unspecified: Secondary | ICD-10-CM

## 2020-06-26 DIAGNOSIS — Z7189 Other specified counseling: Secondary | ICD-10-CM

## 2020-07-03 ENCOUNTER — Ambulatory Visit: Payer: Medicare Other | Admitting: Family Medicine

## 2020-07-03 ENCOUNTER — Other Ambulatory Visit: Payer: Self-pay | Admitting: Family Medicine

## 2020-07-03 DIAGNOSIS — R609 Edema, unspecified: Secondary | ICD-10-CM

## 2020-07-03 DIAGNOSIS — I6930 Unspecified sequelae of cerebral infarction: Secondary | ICD-10-CM

## 2020-07-10 ENCOUNTER — Other Ambulatory Visit: Payer: Self-pay | Admitting: Family Medicine

## 2020-07-10 DIAGNOSIS — E559 Vitamin D deficiency, unspecified: Secondary | ICD-10-CM

## 2020-07-13 ENCOUNTER — Other Ambulatory Visit: Payer: Self-pay | Admitting: Family Medicine

## 2020-07-13 DIAGNOSIS — F419 Anxiety disorder, unspecified: Secondary | ICD-10-CM

## 2020-07-13 DIAGNOSIS — Z7189 Other specified counseling: Secondary | ICD-10-CM

## 2020-07-13 MED ORDER — ZOLPIDEM TARTRATE 5 MG PO TABS: ORAL_TABLET | ORAL | 0 refills | Status: DC

## 2020-07-15 ENCOUNTER — Other Ambulatory Visit: Payer: Self-pay | Admitting: Family Medicine

## 2020-07-15 DIAGNOSIS — E781 Pure hyperglyceridemia: Secondary | ICD-10-CM

## 2020-07-27 ENCOUNTER — Other Ambulatory Visit: Payer: Self-pay | Admitting: Family Medicine

## 2020-07-27 DIAGNOSIS — I160 Hypertensive urgency: Secondary | ICD-10-CM

## 2020-09-01 ENCOUNTER — Other Ambulatory Visit: Payer: Self-pay | Admitting: Family Medicine

## 2020-09-01 DIAGNOSIS — F419 Anxiety disorder, unspecified: Secondary | ICD-10-CM

## 2020-09-01 DIAGNOSIS — E119 Type 2 diabetes mellitus without complications: Secondary | ICD-10-CM

## 2020-09-01 DIAGNOSIS — Z7189 Other specified counseling: Secondary | ICD-10-CM

## 2020-09-01 MED ORDER — ONETOUCH ULTRA BLUE VI STRP: ORAL_STRIP | 0 refills | Status: DC

## 2020-09-11 ENCOUNTER — Other Ambulatory Visit: Payer: Self-pay | Admitting: Family Medicine

## 2020-09-11 DIAGNOSIS — Z7189 Other specified counseling: Secondary | ICD-10-CM

## 2020-09-11 DIAGNOSIS — F419 Anxiety disorder, unspecified: Secondary | ICD-10-CM

## 2020-09-11 NOTE — Telephone Encounter (Signed)
Please review. Thanks!  

## 2020-12-14 DIAGNOSIS — Z23 Encounter for immunization: Secondary | ICD-10-CM | POA: Diagnosis not present

## 2021-02-10 ENCOUNTER — Other Ambulatory Visit: Payer: Self-pay | Admitting: Family Medicine

## 2021-02-10 DIAGNOSIS — Z1231 Encounter for screening mammogram for malignant neoplasm of breast: Secondary | ICD-10-CM

## 2021-02-11 ENCOUNTER — Other Ambulatory Visit: Payer: Self-pay | Admitting: Family Medicine

## 2021-02-11 DIAGNOSIS — F419 Anxiety disorder, unspecified: Secondary | ICD-10-CM

## 2021-02-11 DIAGNOSIS — Z7189 Other specified counseling: Secondary | ICD-10-CM

## 2021-02-13 ENCOUNTER — Other Ambulatory Visit: Payer: Self-pay | Admitting: Family Medicine

## 2021-02-13 DIAGNOSIS — Z7189 Other specified counseling: Secondary | ICD-10-CM

## 2021-02-13 DIAGNOSIS — F419 Anxiety disorder, unspecified: Secondary | ICD-10-CM

## 2021-02-13 MED ORDER — ZOLPIDEM TARTRATE 5 MG PO TABS
ORAL_TABLET | ORAL | 0 refills | Status: DC
Start: 2021-02-13 — End: 2022-03-03

## 2021-03-17 ENCOUNTER — Ambulatory Visit
Admission: RE | Admit: 2021-03-17 | Discharge: 2021-03-17 | Disposition: A | Discharge status: Home / Self Care | Payer: Medicare Other | Source: Ambulatory Visit

## 2021-03-17 ENCOUNTER — Other Ambulatory Visit: Payer: Self-pay

## 2021-03-17 DIAGNOSIS — Z1231 Encounter for screening mammogram for malignant neoplasm of breast: Secondary | ICD-10-CM

## 2021-03-30 ENCOUNTER — Other Ambulatory Visit: Payer: Self-pay

## 2021-03-30 DIAGNOSIS — Z7189 Other specified counseling: Secondary | ICD-10-CM

## 2021-03-30 DIAGNOSIS — F419 Anxiety disorder, unspecified: Secondary | ICD-10-CM

## 2021-12-09 ENCOUNTER — Telehealth: Payer: Medicare Other | Admitting: Family Medicine

## 2021-12-09 DIAGNOSIS — R519 Headache, unspecified: Secondary | ICD-10-CM

## 2021-12-09 NOTE — Progress Notes (Signed)
   Several responses to questionnaire were worrisome debilitating headache and can not lift head, sick since xmas, and several symptoms overlapping overs.  Advised for in person visit given above and medical Hx.

## 2022-02-03 ENCOUNTER — Other Ambulatory Visit: Payer: Self-pay | Admitting: Family Medicine

## 2022-02-03 ENCOUNTER — Other Ambulatory Visit: Payer: Self-pay | Admitting: Nurse Practitioner

## 2022-02-03 DIAGNOSIS — Z1231 Encounter for screening mammogram for malignant neoplasm of breast: Secondary | ICD-10-CM

## 2022-03-02 ENCOUNTER — Encounter: Payer: Self-pay | Admitting: Nurse Practitioner

## 2022-03-02 ENCOUNTER — Ambulatory Visit (INDEPENDENT_AMBULATORY_CARE_PROVIDER_SITE_OTHER): Payer: Medicare Other | Admitting: Nurse Practitioner

## 2022-03-02 VITALS — BP 183/105 | HR 67 | Temp 98.0°F | Ht 63.0 in | Wt 222.5 lb

## 2022-03-02 DIAGNOSIS — R569 Unspecified convulsions: Secondary | ICD-10-CM

## 2022-03-02 DIAGNOSIS — I10 Essential (primary) hypertension: Secondary | ICD-10-CM

## 2022-03-02 DIAGNOSIS — R41840 Attention and concentration deficit: Secondary | ICD-10-CM | POA: Diagnosis not present

## 2022-03-02 DIAGNOSIS — I160 Hypertensive urgency: Secondary | ICD-10-CM | POA: Diagnosis not present

## 2022-03-02 DIAGNOSIS — F419 Anxiety disorder, unspecified: Secondary | ICD-10-CM

## 2022-03-02 DIAGNOSIS — E119 Type 2 diabetes mellitus without complications: Secondary | ICD-10-CM | POA: Diagnosis not present

## 2022-03-02 DIAGNOSIS — I6930 Unspecified sequelae of cerebral infarction: Secondary | ICD-10-CM | POA: Diagnosis not present

## 2022-03-02 DIAGNOSIS — I69351 Hemiplegia and hemiparesis following cerebral infarction affecting right dominant side: Secondary | ICD-10-CM | POA: Diagnosis not present

## 2022-03-02 DIAGNOSIS — I6381 Other cerebral infarction due to occlusion or stenosis of small artery: Secondary | ICD-10-CM

## 2022-03-02 DIAGNOSIS — E781 Pure hyperglyceridemia: Secondary | ICD-10-CM | POA: Diagnosis not present

## 2022-03-02 LAB — POCT GLYCOSYLATED HEMOGLOBIN (HGB A1C)
HbA1c POC (<> result, manual entry): 5.9 % (ref 4.0–5.6)
HbA1c, POC (controlled diabetic range): 5.9 % (ref 0.0–7.0)
HbA1c, POC (prediabetic range): 5.9 % (ref 5.7–6.4)
Hemoglobin A1C: 5.9 % — AB (ref 4.0–5.6)

## 2022-03-02 MED ORDER — METFORMIN HCL 500 MG PO TABS: 500.0000 mg | ORAL_TABLET | Freq: Two times a day (BID) | ORAL | 3 refills | Status: DC

## 2022-03-02 MED ORDER — LOSARTAN POTASSIUM 50 MG PO TABS: 50.0000 mg | ORAL_TABLET | Freq: Two times a day (BID) | ORAL | 1 refills | Status: DC

## 2022-03-02 MED ORDER — TOPIRAMATE 100 MG PO TABS: ORAL_TABLET | ORAL | 0 refills | Status: DC

## 2022-03-02 MED ORDER — CYCLOBENZAPRINE HCL 5 MG PO TABS: 5.0000 mg | ORAL_TABLET | Freq: Three times a day (TID) | ORAL | 1 refills | Status: DC | PRN

## 2022-03-02 MED ORDER — VICTOZA 18 MG/3ML ~~LOC~~ SOPN: 1.2000 mg | PEN_INJECTOR | SUBCUTANEOUS | 3 refills | Status: DC

## 2022-03-02 MED ORDER — CLONIDINE HCL 0.1 MG PO TABS: 0.1000 mg | ORAL_TABLET | Freq: Once | ORAL | Status: DC

## 2022-03-02 MED ORDER — ATORVASTATIN CALCIUM 40 MG PO TABS: 40.0000 mg | ORAL_TABLET | Freq: Every day | ORAL | 2 refills | Status: DC

## 2022-03-02 NOTE — Patient Instructions (Addendum)
1. Type 2 diabetes mellitus without complication, without long-term current use of insulin (HCC) ? ?Lab Results  ?Component Value Date  ? HGBA1C 5.9 (A) 03/02/2022  ? HGBA1C 5.9 03/02/2022  ? HGBA1C 5.9 03/02/2022  ? HGBA1C 5.9 03/02/2022  ? ? ? ?- POCT glycosylated hemoglobin (Hb A1C) ?- metFORMIN (GLUCOPHAGE) 500 MG tablet; Take 1 tablet (500 mg total) by mouth 2 (two) times daily with a meal.  Dispense: 180 tablet; Refill: 3 ?- CBC ?- Comprehensive metabolic panel ?- Lipid Panel ? ?2. History of CVA with residual deficit ? ?- Ambulatory referral to Neurology ? ?3. Seizure (HCC) ? ?- Ambulatory referral to Neurology ? ?4. Hemiparesis affecting right side as late effect of cerebrovascular accident [facility]) ? ?- topiramate (TOPAMAX) 100 MG tablet; TAKE 1 TABLET IN MORNING AND 2 TABLETS AT NIGHT  Dispense: 20 tablet; Refill: 0 ? ?5. Lacunar infarct, acute (HCC) ? ?- topiramate (TOPAMAX) 100 MG tablet; TAKE 1 TABLET IN MORNING AND 2 TABLETS AT NIGHT  Dispense: 20 tablet; Refill: 0 ? ?6. Pure hyperglyceridemia ? ?- atorvastatin (LIPITOR) 40 MG tablet; Take 1 tablet (40 mg total) by mouth daily at 6 PM.  Dispense: 90 tablet; Refill: 2 ?- CBC ?- Comprehensive metabolic panel ?- Lipid Panel ? ?7. Hypertensive urgency ? ?- losartan (COZAAR) 50 MG tablet; Take 1 tablet (50 mg total) by mouth in the morning and at bedtime.  Dispense: 180 tablet; Refill: 1 ? ?8. Hypertension, unspecified type ? ?- CBC ?- Comprehensive metabolic panel ?- Lipid Panel ? ?9. Anxiety ? ?- Ambulatory referral to Psychiatry ?- Ambulatory referral to Speech Therapy ? ?10. Impaired concentration ? ?- Ambulatory referral to Psychiatry ?- Ambulatory referral to Speech Therapy ? ?Follow up: ? ?Follow up in 3 months ? ?

## 2022-03-02 NOTE — Assessment & Plan Note (Signed)
?  Lab Results  ?Component Value Date  ? HGBA1C 5.9 (A) 03/02/2022  ? HGBA1C 5.9 03/02/2022  ? HGBA1C 5.9 03/02/2022  ? HGBA1C 5.9 03/02/2022  ? ? ? ?- POCT glycosylated hemoglobin (Hb A1C) ?- metFORMIN (GLUCOPHAGE) 500 MG tablet; Take 1 tablet (500 mg total) by mouth 2 (two) times daily with a meal.  Dispense: 180 tablet; Refill: 3 ?- CBC ?- Comprehensive metabolic panel ?- Lipid Panel ? ?2. History of CVA with residual deficit ? ?- Ambulatory referral to Neurology ? ?3. Seizure (HCC) ? ?- Ambulatory referral to Neurology ? ?4. Hemiparesis affecting right side as late effect of cerebrovascular accident [facility]) ? ?- topiramate (TOPAMAX) 100 MG tablet; TAKE 1 TABLET IN MORNING AND 2 TABLETS AT NIGHT  Dispense: 20 tablet; Refill: 0 ? ?5. Lacunar infarct, acute (HCC) ? ?- topiramate (TOPAMAX) 100 MG tablet; TAKE 1 TABLET IN MORNING AND 2 TABLETS AT NIGHT  Dispense: 20 tablet; Refill: 0 ? ?6. Pure hyperglyceridemia ? ?- atorvastatin (LIPITOR) 40 MG tablet; Take 1 tablet (40 mg total) by mouth daily at 6 PM.  Dispense: 90 tablet; Refill: 2 ?- CBC ?- Comprehensive metabolic panel ?- Lipid Panel ? ?7. Hypertensive urgency ? ?- losartan (COZAAR) 50 MG tablet; Take 1 tablet (50 mg total) by mouth in the morning and at bedtime.  Dispense: 180 tablet; Refill: 1 ? ?8. Hypertension, unspecified type ? ?- CBC ?- Comprehensive metabolic panel ?- Lipid Panel ? ?9. Anxiety ? ?- Ambulatory referral to Psychiatry ?- Ambulatory referral to Speech Therapy ? ?10. Impaired concentration ? ?- Ambulatory referral to Psychiatry ?- Ambulatory referral to Speech Therapy ? ?Follow up: ? ?Follow up in 3 months ? ?
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
## 2022-03-03 ENCOUNTER — Other Ambulatory Visit: Payer: Self-pay | Admitting: Nurse Practitioner

## 2022-03-03 DIAGNOSIS — Z7189 Other specified counseling: Secondary | ICD-10-CM

## 2022-03-03 DIAGNOSIS — F419 Anxiety disorder, unspecified: Secondary | ICD-10-CM

## 2022-03-03 LAB — CBC
Hematocrit: 38 % (ref 34.0–46.6)
Hemoglobin: 12.3 g/dL (ref 11.1–15.9)
MCH: 24.7 pg — ABNORMAL LOW (ref 26.6–33.0)
MCHC: 32.4 g/dL (ref 31.5–35.7)
MCV: 76 fL — ABNORMAL LOW (ref 79–97)
Platelets: 376 10*3/uL (ref 150–450)
RBC: 4.98 x10E6/uL (ref 3.77–5.28)
RDW: 15.8 % — ABNORMAL HIGH (ref 11.7–15.4)
WBC: 7.4 10*3/uL (ref 3.4–10.8)

## 2022-03-03 LAB — COMPREHENSIVE METABOLIC PANEL
ALT: 12 IU/L (ref 0–32)
AST: 15 IU/L (ref 0–40)
Albumin/Globulin Ratio: 1.8 (ref 1.2–2.2)
Albumin: 4.1 g/dL (ref 3.8–4.8)
Alkaline Phosphatase: 80 IU/L (ref 44–121)
BUN/Creatinine Ratio: 13 (ref 9–23)
BUN: 11 mg/dL (ref 6–24)
Bilirubin Total: 0.3 mg/dL (ref 0.0–1.2)
CO2: 24 mmol/L (ref 20–29)
Calcium: 9.1 mg/dL (ref 8.7–10.2)
Chloride: 106 mmol/L (ref 96–106)
Creatinine, Ser: 0.84 mg/dL (ref 0.57–1.00)
Globulin, Total: 2.3 g/dL (ref 1.5–4.5)
Glucose: 86 mg/dL (ref 70–99)
Potassium: 4.2 mmol/L (ref 3.5–5.2)
Sodium: 144 mmol/L (ref 134–144)
Total Protein: 6.4 g/dL (ref 6.0–8.5)
eGFR: 86 mL/min/{1.73_m2} (ref >59)

## 2022-03-03 LAB — LIPID PANEL
Chol/HDL Ratio: 4.1 ratio (ref 0.0–4.4)
Cholesterol, Total: 199 mg/dL (ref 100–199)
HDL: 49 mg/dL (ref >39)
LDL Chol Calc (NIH): 138 mg/dL — ABNORMAL HIGH (ref 0–99)
Triglycerides: 66 mg/dL (ref 0–149)
VLDL Cholesterol Cal: 12 mg/dL (ref 5–40)

## 2022-03-03 MED ORDER — ZOLPIDEM TARTRATE 5 MG PO TABS: ORAL_TABLET | ORAL | 0 refills | Status: DC

## 2022-03-04 ENCOUNTER — Telehealth: Payer: Self-pay | Admitting: Nurse Practitioner

## 2022-03-04 ENCOUNTER — Other Ambulatory Visit: Payer: Self-pay | Admitting: Nurse Practitioner

## 2022-03-04 ENCOUNTER — Encounter: Payer: Self-pay | Admitting: Neurology

## 2022-03-04 ENCOUNTER — Ambulatory Visit: Payer: Medicare Other | Admitting: Nurse Practitioner

## 2022-03-04 VITALS — BP 183/102 | HR 70 | Temp 98.0°F

## 2022-03-04 DIAGNOSIS — I10 Essential (primary) hypertension: Secondary | ICD-10-CM

## 2022-03-04 DIAGNOSIS — I160 Hypertensive urgency: Secondary | ICD-10-CM

## 2022-03-04 MED ORDER — HYDROCHLOROTHIAZIDE 12.5 MG PO TABS: 12.5000 mg | ORAL_TABLET | Freq: Every day | ORAL | 0 refills | Status: DC

## 2022-03-04 NOTE — Telephone Encounter (Signed)
Patient was seen for nurse visit today. Blood pressure was still elevated. Will start HCTZ. Advised to go to the ED if blood pressure continues to stay elevated this afternoon. ?

## 2022-03-09 ENCOUNTER — Encounter: Payer: Self-pay | Admitting: Speech Pathology

## 2022-03-09 ENCOUNTER — Other Ambulatory Visit: Payer: Self-pay

## 2022-03-09 ENCOUNTER — Ambulatory Visit: Payer: Medicare Other | Attending: Nurse Practitioner | Admitting: Speech Pathology

## 2022-03-09 DIAGNOSIS — Z20822 Contact with and (suspected) exposure to covid-19: Secondary | ICD-10-CM | POA: Diagnosis not present

## 2022-03-09 DIAGNOSIS — R41841 Cognitive communication deficit: Secondary | ICD-10-CM | POA: Insufficient documentation
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
## 2022-03-11 ENCOUNTER — Other Ambulatory Visit: Payer: Self-pay

## 2022-03-11 ENCOUNTER — Ambulatory Visit: Payer: Medicare Other | Admitting: Nurse Practitioner

## 2022-03-11 VITALS — BP 138/103 | HR 90

## 2022-03-11 DIAGNOSIS — I69351 Hemiplegia and hemiparesis following cerebral infarction affecting right dominant side: Secondary | ICD-10-CM

## 2022-03-11 DIAGNOSIS — I6381 Other cerebral infarction due to occlusion or stenosis of small artery: Secondary | ICD-10-CM

## 2022-03-11 DIAGNOSIS — I10 Essential (primary) hypertension: Secondary | ICD-10-CM

## 2022-03-11 MED ORDER — TOPIRAMATE 100 MG PO TABS: ORAL_TABLET | ORAL | 0 refills | Status: DC

## 2022-03-11 NOTE — Patient Instructions (Signed)
Keep follow up appt in July. ?

## 2022-03-11 NOTE — Progress Notes (Signed)
Patient is here today for a blood pressure check. Patient states that her blood pressure is still elevated range from 200/120 over the weekend. Patient did experience some jaw pains over the weekend as well. Patient is currently taking HCTZ and Losartin to help with blood pressure levels.  ? ?Sharon Chan ? ?

## 2022-03-14 ENCOUNTER — Ambulatory Visit: Payer: Medicare Other | Attending: Nurse Practitioner

## 2022-03-14 DIAGNOSIS — R41841 Cognitive communication deficit: Secondary | ICD-10-CM | POA: Insufficient documentation

## 2022-03-14 NOTE — Therapy (Signed)
?OUTPATIENT SPEECH LANGUAGE PATHOLOGY TREATMENT NOTE ? ? ?Patient Name: Sharon Chan ?MRN: [id number] ?DOB:03-15-1976, 46 y.o., female ?Today's Date: 03/14/2022 ? ?PCP: Angus Seller ?REFERRING PROVIDER: Angus Seller ? ?END OF SESSION:  ? End of Session - 03/14/22 1239   ? ? Visit Number 2   ? Number of Visits 25   ? Date for SLP Re-Evaluation 06/01/22   ? Authorization Type medicare/medicaid   ? SLP Start Time 1100   ? SLP Stop Time  1147   ? SLP Time Calculation (min) 47 min   ? Activity Tolerance Patient tolerated treatment well   ? ?  ?  ? ?  ? ? ?Past Medical History:  ?Diagnosis Date  ? Anxiety   ? CVA (cerebral vascular accident) [facility])   ? Depression   ? DM type 2 (diabetes mellitus, type 2) ([facility])   ? HLD (hyperlipidemia)   ? HTN (hypertension)   ? Joint pain   ? Migraines   ? Neck pain   ? Obese   ? Polycystic disease, ovaries   ? Right sided weakness   ? Seizures ([facility])   ? Shoulder pain   ? Sickle cell trait ([facility])   ? Thyroid nodule   ? Vitamin D deficiency 01/2020  ? ?Past Surgical History:  ?Procedure Laterality Date  ? LAPAROSCOPIC CHOLECYSTECTOMY    ? ?Patient Active Problem List  ? Diagnosis Date Noted  ? Dehydration 06/12/2020  ? Hypertensive urgency 06/12/2020  ? History of recent fall 05/06/2020  ? Hemoglobin A1c less than 7.0% 02/03/2020  ? Hyperglycemia due to diabetes mellitus ([facility]) 02/03/2020  ? Right sided weakness 10/21/2019  ? History of CVA with residual deficit 10/21/2019  ? Peripheral neuropathy 07/19/2019  ? Insomnia 07/19/2019  ? Edema 07/19/2019  ? Anxiety 07/19/2019  ? Class 3 severe obesity due to excess calories with serious comorbidity and body mass index (BMI) of 40.0 to 44.9 in adult [facility]) 07/19/2019  ? Shortness of breath on exertion 05/28/2018  ? Type 2 diabetes mellitus without complication, without long-term current use of insulin ([facility]) 05/28/2018  ? Other fatigue 05/28/2018  ? Essential hypertension 05/28/2018  ? Vitamin D deficiency 05/28/2018  ? B12 nutritional  deficiency 05/28/2018  ? Hemiparesis affecting right side as late effect of cerebrovascular accident ([facility]) 03/23/2017  ? Lacunar infarct, acute ([facility]) 03/23/2017  ? Visual disturbance as complication of stroke 03/07/2017  ? Adjustment disorder with anxious mood   ? 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 Sickle cell trait ([facility])   ? Hyperlipidemia   ? Thyroid nodule   ? Changes in vision   ? Acute ischemic stroke ([facility])   ? Dysphagia, post-stroke   ? Benign essential HTN   ? Polycystic kidney disease   ? Prediabetes   ? Acute intractable headache   ? Leukocytosis   ? Stroke (cerebrum) ([facility]) 02/25/2017  ? ? ?ONSET DATE: January 2023  ? ?REFERRING DIAG: F41.9 (ICD-10-CM) - Anxiety R41.840 (ICD-10-CM) - Impaired concentration  ? ?THERAPY DIAG: Cognitive communication deficit ? ?SUBJECTIVE: "I don't sleep well" ? ?PAIN:  ?Are you having pain? Yes ?NPRS scale: 10/10 ?Pain location: Headache ?Pain description: constant  ?Aggravating factors: lights ?Relieving factors: none ? ? ? ?OBJECTIVE:  ? ?TODAY'S TREATMENT:  ? 03-14-22: Discussed evaluation results and ST goals. Pt attempting use of external compensations - to do lists, alarms/timers, and pill organizer for medication management with rare to occasional success. Pt has been calling mom when she  takes her medications to aid recall as pt reports concern for consuming medications twice. SLP generated strategy to write down time on calendar to aid recall. SLP suggested pt bring pill box next session to target functional medication task for improved recall and attention. Difficulty with multitasking indicated. SLP educated and instructed attention and memory compensations to aid daily functioning. Pt identified she becomes easily distracted by environmental noises and internal distractions and able to generate appropriate solutions with rare min A. Will continue to target carryover of recommended techniques to aid efficiency and accuracy of  household tasks. Initial stutter significantly declined as session progressed without intervention as pt became more comfortable with new therapist.  ?  ?PATIENT EDUCATION: ?Education details: see above ?Person educated: Patient ?Education method: Explanation, Demonstration, Handout ?Education comprehension: verbal cues required and needs further education ?  ?  ? ?GOALS: ?Goals reviewed with patient? Yes ?  ?SHORT TERM GOALS: Target date: 04/06/2022 ?  ?Pt will manage medications with no errors over 1 week with occasional min A ?Baseline: ?Goal status: ongoing ?  ?2.  Pt will use memory system to manage her appointments and daily tasks to complete 3-4 household tasks daily ?Baseline:  ?Goal status: ongoing ?  ?3.  Pt will use external aid to recall her showers to reduce showering to 1x a day or less ?Baseline:  ?Goal status: ongoing ?  ?4.  Pt will state personally relevant numbers accurately with occasional min A by getting them correct in her head before saying them aloud ?Baseline:  ?Goal status: ongoing ?  ?LONG TERM GOALS: Target date: 06/01/2022 ?  ?Pt will independently use calendar, lists and other external aids to manage appointments and daily tasks over 1 week  ?Baseline:  ?Goal status: ongoing ?  ?2.  Pt will report no medication errors over 2 weeks ?Baseline:  ?Goal status: ongoing ?  ?3.  Pt will use fluent speech over 20 minute conversation with occasional min A over 2 sessions ?Baseline:  ?Goal status: ongoing ?  ?4.  Pt will improve score on Cognitive Function PROM by 4 points ?Baseline: 43 ?Goal status: ongoing ?  ?ASSESSMENT: ?  ?CLINICAL IMPRESSION: ?Patient is a 46 y.o. female who was seen today for cognitive impairments. Today she presents with functional cognitive communication impairment and functional mild dysfluency. Sharon Chan states her cognitive difficulty started in January of 2023 as she thinks she had another stroke. Speech with inconsistent stutter which significantly decreased as session  progressed. Initiated education and training of functional compensatory techniques to aid completion of daily tasks. At this time, I recommend skilled ST to maximize cognition for safety, medication management and success managing schedule and daily tasks ?  ?OBJECTIVE IMPAIRMENTS include attention, memory, and dysarthria. These impairments are limiting patient from managing medications, managing appointments, managing finances, household responsibilities, and ADLs/IADLs. ?Factors affecting potential to achieve goals and functional outcome are  anxiety .Marland Kitchen Patient will benefit from skilled SLP services to address above impairments and improve overall function. ?  ?REHAB POTENTIAL: Good ?  ?PLAN: ?SLP FREQUENCY: 2x/week ?  ?SLP DURATION: 12 weeks ?  ?PLANNED INTERVENTIONS: Environmental controls, Cueing hierachy, Cognitive reorganization, Internal/external aids, Functional tasks, Multimodal communication approach, and SLP instruction and feedback ?  ?  ? ? ?Gracy Racer, [name] ?03/14/2022, 12:40 PM ? ? ?

## 2022-03-14 NOTE — Patient Instructions (Addendum)
Write out a daily schedule with targeted times (ex: medications at 9am). ?Check each task every day  ?Devotional 1-3 AM ?Walk 3-6 AM ? ? ?Medication Recommendations: ?Bring pill organizer to next Speech Therapy session  ?Put calendar bedside your medications - write down time you took medications  ?Plan to take medications with meals (meal around ~9 AM ?Use pill organizer to help you visually see if you have taken your medications  ?

## 2022-03-16 ENCOUNTER — Encounter: Payer: Self-pay | Admitting: Speech Pathology

## 2022-03-16 ENCOUNTER — Ambulatory Visit: Payer: Medicare Other | Admitting: Speech Pathology

## 2022-03-16 DIAGNOSIS — R41841 Cognitive communication deficit: Secondary | ICD-10-CM

## 2022-03-16 NOTE — Patient Instructions (Addendum)
? ?  Breathe2Relax app is free if you want to try that ? ?Put 64 oz of water in a clear pitcher so you can visualize how much or how little you drink in a day - you can use it to fill your portable water bottle  ? ?Use your white board if needed to keep track of meals - let us know how it goes ? ?It's OK to veer from [facility] diet recommendations if you need to start of with foods that are more palatable to you ? ?Write down concerns and questions for Dr. Karel Jarvis before your appointment and bring them with you ? ? ? ? ?

## 2022-03-16 NOTE — Therapy (Signed)
?OUTPATIENT SPEECH LANGUAGE PATHOLOGY TREATMENT NOTE ? ? ?Patient Name: Sharon Chan ?MRN: [id number] ?DOB:[birth year], 46 y.o., female ?Today's Date: 03/16/2022 ? ?PCP: Angus Seller ?REFERRING PROVIDER: Angus Seller ? ?END OF SESSION:  ? End of Session - 03/16/22 1110   ? ? Visit Number 3   ? Number of Visits 25   ? Date for SLP Re-Evaluation 06/01/22   ? Authorization Type medicare/medicaid   ? SLP Start Time 1105   ? SLP Stop Time  1145   ? SLP Time Calculation (min) 40 min   ? Activity Tolerance Other (comment)   anxiety affects  ? ?  ?  ? ?  ? ? ?Past Medical History:  ?Diagnosis Date  ? Anxiety   ? CVA (cerebral vascular accident) [facility])   ? Depression   ? DM type 2 (diabetes mellitus, type 2) (HCC)   ? HLD (hyperlipidemia)   ? HTN (hypertension)   ? Joint pain   ? Migraines   ? Neck pain   ? Obese   ? Polycystic disease, ovaries   ? Right sided weakness   ? Seizures (HCC)   ? Shoulder pain   ? Sickle cell trait (HCC)   ? Thyroid nodule   ? Vitamin D deficiency 01/2020  ? ?Past Surgical History:  ?Procedure Laterality Date  ? LAPAROSCOPIC CHOLECYSTECTOMY    ? ?Patient Active Problem List  ? Diagnosis Date Noted  ? Dehydration 06/12/2020  ? Hypertensive urgency 06/12/2020  ? History of recent fall 05/06/2020  ? Hemoglobin A1c less than 7.0% 02/03/2020  ? Hyperglycemia due to diabetes mellitus (HCC) 02/03/2020  ? Right sided weakness 10/21/2019  ? History of CVA with residual deficit 10/21/2019  ? Peripheral neuropathy 07/19/2019  ? Insomnia 07/19/2019  ? Edema 07/19/2019  ? Anxiety 07/19/2019  ? Class 3 severe obesity due to excess calories with serious comorbidity and body mass index (BMI) of 40.0 to 44.9 in adult [facility]) 07/19/2019  ? Shortness of breath on exertion 05/28/2018  ? Type 2 diabetes mellitus without complication, without long-term current use of insulin (HCC) 05/28/2018  ? Other fatigue 05/28/2018  ? Essential hypertension 05/28/2018  ? Vitamin D deficiency 05/28/2018  ? B12 nutritional  deficiency 05/28/2018  ? Hemiparesis affecting right side as late effect of cerebrovascular accident (HCC) 03/23/2017  ? Lacunar infarct, acute (HCC) 03/23/2017  ? Visual disturbance as complication of stroke 03/07/2017  ? Adjustment disorder with anxious mood   ? 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 Sickle cell trait (HCC)   ? Hyperlipidemia   ? Thyroid nodule   ? Changes in vision   ? Acute ischemic stroke (HCC)   ? Dysphagia, post-stroke   ? Benign essential HTN   ? Polycystic kidney disease   ? Prediabetes   ? Acute intractable headache   ? Leukocytosis   ? Stroke (cerebrum) (HCC) 02/25/2017  ? ? ?ONSET DATE: January 2023  ? ?REFERRING DIAG: F41.9 (ICD-10-CM) - Anxiety R41.840 (ICD-10-CM) - Impaired concentration  ? ?THERAPY DIAG: Cognitive communication deficit ? ?SUBJECTIVE: "I don't sleep well" ? ?PAIN:  ?Are you having pain? Yes ?NPRS scale: 10/10 ?Pain location: Headache ?Pain description: constant  ?Aggravating factors: lights ?Relieving factors: none ? ? ? ?OBJECTIVE:  ? ?TODAY'S TREATMENT:  ? ?03-16-22: Sharon Chan enters with inconsistent dysfluency. We discussed how holding a paper clips helps distract her from focusing on her speech which increases fluency. She forgot her pill box for today's session, but she purchased  a more simple pill box and has started a system of using a white board next to her calendar to write down when she takes her pills with success this week. She continues to report forgetting to eat and drink. She is taking protein meal replacement drinks instead of eating. We generated a meal plan for a week. Sharon Chan used to attend [facility] and used these recommendations for plan. She used to meal prep on Sundays. Instructed her to return to meal prep on Sundays for at least 5 days. Sharon Chan verbalized distraction of exuberant voice therapy next door, but did not require re-direction to meal plan task. In order to recall  amount of water,  instructed her to put 64 oz in a clear pitcher so she can visualize how much/little she drinks in a day. Inconsistent , irregular L UE tremor vs dyskinesia noted during session.  ? ? ? 03-14-22: Discussed evaluation results and ST goals. Pt attempting use of external compensations - to do lists, alarms/timers, and pill organizer for medication management with rare to occasional success. Pt has been calling mom when she takes her medications to aid recall as pt reports concern for consuming medications twice. SLP generated strategy to write down time on calendar to aid recall. SLP suggested pt bring pill box next session to target functional medication task for improved recall and attention. Difficulty with multitasking indicated. SLP educated and instructed attention and memory compensations to aid daily functioning. Pt identified she becomes easily distracted by environmental noises and internal distractions and able to generate appropriate solutions with rare min A. Will continue to target carryover of recommended techniques to aid efficiency and accuracy of household tasks. Initial stutter significantly declined as session progressed without intervention as pt became more comfortable with new therapist.  ?  ?PATIENT EDUCATION: ?Education details: meal plan and prep weekly, use white board to A in recall of eating ?Person educated: Patient ?Education method: Explanation, Demonstration, Handout ?Education comprehension: verbal cues required and needs further education ?  ?  ? ?GOALS: ?Goals reviewed with patient? Yes ?  ?SHORT TERM GOALS: Target date: 04/06/2022 ?  ?Pt will manage medications with no errors over 1 week with occasional min A ?Baseline: ?Goal status: ongoing ?  ?2.  Pt will use memory system to manage her appointments and daily tasks to complete 3-4 household tasks daily ?Baseline:  ?Goal status: ongoing ?  ?3.  Pt will use external aid to recall her showers to reduce showering to 1x a day or  less ?Baseline:  ?Goal status: ongoing ?  ?4.  Pt will state personally relevant numbers accurately with occasional min A by getting them correct in her head before saying them aloud ?Baseline:  ?Goal status: ongoing ?  ?LONG TERM GOALS: Target date: 06/01/2022 ?  ?Pt will independently use calendar, lists and other external aids to manage appointments and daily tasks over 1 week  ?Baseline:  ?Goal status: ongoing ?  ?2.  Pt will report no medication errors over 2 weeks ?Baseline:  ?Goal status: ongoing ?  ?3.  Pt will use fluent speech over 20 minute conversation with occasional min A over 2 sessions ?Baseline:  ?Goal status: ongoing ?  ?4.  Pt will improve score on Cognitive Function PROM by 4 points ?Baseline: 43 ?Goal status: ongoing ?  ?ASSESSMENT: ?  ?CLINICAL IMPRESSION: ?Patient is a 46 y.o. female who was seen today for cognitive impairments. Today she presents with functional cognitive communication impairment and functional mild dysfluency. Sharon Chan  states her cognitive difficulty started in January of 2023 as she thinks she had another stroke. Speech with inconsistent stutter which significantly decreased as session progressed. Initiated education and training of functional compensatory techniques to aid completion of daily tasks. At this time, I recommend skilled ST to maximize cognition for safety, medication management and success managing schedule and daily tasks. She is traveling 5/10-5/17 and will resume ST upon her return to town. ?  ?OBJECTIVE IMPAIRMENTS include attention, memory, and dysarthria. These impairments are limiting patient from managing medications, managing appointments, managing finances, household responsibilities, and ADLs/IADLs. ?Factors affecting potential to achieve goals and functional outcome are  anxiety .Marland Kitchen Patient will benefit from skilled SLP services to address above impairments and improve overall function. ?  ?REHAB POTENTIAL: Good ?  ?PLAN: ?SLP FREQUENCY: 2x/week ?   ?SLP DURATION: 12 weeks ?  ?PLANNED INTERVENTIONS: Environmental controls, Cueing hierachy, Cognitive reorganization, Internal/external aids, Functional tasks, Multimodal communication approach, and SLP instruction and feedb

## 2022-03-17 DIAGNOSIS — Z20822 Contact with and (suspected) exposure to covid-19: Secondary | ICD-10-CM | POA: Diagnosis not present

## 2022-03-22 ENCOUNTER — Ambulatory Visit
Admission: RE | Admit: 2022-03-22 | Discharge: 2022-03-22 | Disposition: A | Discharge status: Home / Self Care | Payer: Medicare Other | Source: Ambulatory Visit | Attending: Nurse Practitioner | Admitting: Nurse Practitioner

## 2022-03-22 DIAGNOSIS — Z1231 Encounter for screening mammogram for malignant neoplasm of breast: Secondary | ICD-10-CM | POA: Diagnosis not present

## 2022-03-26 ENCOUNTER — Other Ambulatory Visit: Payer: Self-pay | Admitting: Nurse Practitioner

## 2022-03-26 DIAGNOSIS — I10 Essential (primary) hypertension: Secondary | ICD-10-CM

## 2022-03-31 ENCOUNTER — Encounter: Payer: Self-pay | Admitting: Neurology

## 2022-03-31 ENCOUNTER — Ambulatory Visit: Payer: Medicare Other

## 2022-03-31 ENCOUNTER — Ambulatory Visit (INDEPENDENT_AMBULATORY_CARE_PROVIDER_SITE_OTHER): Payer: Medicare Other | Admitting: Neurology

## 2022-03-31 VITALS — BP 151/99 | HR 77 | Ht 63.0 in | Wt 215.0 lb

## 2022-03-31 DIAGNOSIS — R569 Unspecified convulsions: Secondary | ICD-10-CM | POA: Diagnosis not present

## 2022-03-31 DIAGNOSIS — Z8673 Personal history of transient ischemic attack (TIA), and cerebral infarction without residual deficits: Secondary | ICD-10-CM

## 2022-03-31 DIAGNOSIS — I69351 Hemiplegia and hemiparesis following cerebral infarction affecting right dominant side: Secondary | ICD-10-CM

## 2022-03-31 DIAGNOSIS — I6381 Other cerebral infarction due to occlusion or stenosis of small artery: Secondary | ICD-10-CM

## 2022-03-31 DIAGNOSIS — R41841 Cognitive communication deficit: Secondary | ICD-10-CM

## 2022-03-31 DIAGNOSIS — R519 Headache, unspecified: Secondary | ICD-10-CM | POA: Diagnosis not present

## 2022-03-31 MED ORDER — NORTRIPTYLINE HCL 25 MG PO CAPS: ORAL_CAPSULE | ORAL | 6 refills | Status: DC

## 2022-03-31 MED ORDER — TOPIRAMATE 100 MG PO TABS: ORAL_TABLET | ORAL | 6 refills | Status: DC

## 2022-03-31 NOTE — Patient Instructions (Signed)
Schedule MRI brain with and without contrast  2. Schedule 1-hour EEG  3. Start nortriptyline 25mg : Take 1 capsule every night for 1 week, then increase to 2 capsules every night for 1 week, then increase to 3 capsules every night and continue  4. Continue Topamax 100mg : take 1 tablet in AM, 2 tablets in PM  5. Follow-up in 2-3 months, call for any changes   Seizure Precautions: 1. If medication has been prescribed for you to prevent seizures, take it exactly as directed.  Do not stop taking the medicine without talking to your doctor first, even if you have not had a seizure in a long time.   2. Avoid activities in which a seizure would cause danger to yourself or to others.  Don't operate dangerous machinery, swim alone, or climb in high or dangerous places, such as on ladders, roofs, or girders.  Do not drive unless your doctor says you may.  3. If you have any warning that you may have a seizure, lay down in a safe place where you can't hurt yourself.    4.  No driving for 6 months from last seizure, as per [facility].   Please refer to the following link on the [employer]'s website for more information: http://www.epilepsyfoundation.org/answerplace/Social/driving/drivingu.cfm   5.  Maintain good sleep hygiene. Avoid alcohol.  6.  Contact your doctor if you have any problems that may be related to the medicine you are taking.  7.  Call 911 and bring the patient back to the ED if:        A.  The seizure lasts longer than 5 minutes.       B.  The patient doesn't awaken shortly after the seizure  C.  The patient has new problems such as difficulty seeing, speaking or moving  D.  The patient was injured during the seizure  E.  The patient has a temperature over 102 F (39C)  F.  The patient vomited and now is having trouble breathing

## 2022-03-31 NOTE — Therapy (Signed)
OUTPATIENT SPEECH LANGUAGE PATHOLOGY TREATMENT NOTE   Patient Name: Sharon Chan MRN: [id number] DOB:11/06/1976, 46 y.o., female Today's Date: 03/31/2022  PCP: Angus SellerNichols, Tonya REFERRING PROVIDER: Angus SellerNichols, Tonya  END OF SESSION:   End of Session - 03/31/22 1307     Visit Number 4    Number of Visits 25    Date for SLP Re-Evaluation 06/01/22    Authorization Type medicare/medicaid    SLP Start Time 1310    SLP Stop Time  1355    SLP Time Calculation (min) 45 min    Activity Tolerance Patient tolerated treatment well             Past Medical History:  Diagnosis Date   Anxiety    CVA (cerebral vascular accident) (HCC)    Depression    DM type 2 (diabetes mellitus, type 2) (HCC)    HLD (hyperlipidemia)    HTN (hypertension)    Joint pain    Migraines    Neck pain    Obese    Polycystic disease, ovaries    Right sided weakness    Seizures (HCC)    Shoulder pain    Sickle cell trait (HCC)    Thyroid nodule    Vitamin D deficiency 01/2020   Past Surgical History:  Procedure Laterality Date   LAPAROSCOPIC CHOLECYSTECTOMY     Patient Active Problem List   Diagnosis Date Noted   Dehydration 06/12/2020   Hypertensive urgency 06/12/2020   History of recent fall 05/06/2020   Hemoglobin A1c less than 7.0% 02/03/2020   Hyperglycemia due to diabetes mellitus (HCC) 02/03/2020   Right sided weakness 10/21/2019   History of CVA with residual deficit 10/21/2019   Peripheral neuropathy 07/19/2019   Insomnia 07/19/2019   Edema 07/19/2019   Anxiety 07/19/2019   Class 3 severe obesity due to excess calories with serious comorbidity and body mass index (BMI) of 40.0 to 44.9 in adult [facility](HCC) 07/19/2019   Shortness of breath on exertion 05/28/2018   Type 2 diabetes mellitus without complication, without long-term current use of insulin (HCC) 05/28/2018   Other fatigue 05/28/2018   Essential hypertension 05/28/2018   Vitamin D deficiency 05/28/2018   B12 nutritional  deficiency 05/28/2018   Hemiparesis affecting right side as late effect of cerebrovascular accident (HCC) 03/23/2017   Lacunar infarct, acute (HCC) 03/23/2017   Visual disturbance as complication of stroke 03/07/2017   Adjustment disorder with anxious mood    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 Yes NPRS scale: 10/10 Pain location: Headache Pain description: constant  Aggravating factors: lights Relieving factors: none   OBJECTIVE:   TODAY'S TREATMENT:  03-31-22: Recalled recent MD appointment with good recall of specifics indicated per chart review. Inconsistent occasional fading to rare dysfluency exhibited in conversation. Anxiety reportedly improves with staying busy with full schedule. SLP recommended modifying daily schedule to reflect  obtainable task completion. Use of timers reported with good success. Some missed steps in ADL routines reported, including forgetting to don pants or shoes despite laying out clothes ahead of time and utilizing visual cues. "Much better" for medication management, although some difficulty with managing insulin. Assisted patient identify time to associate insulin management and eating for improved recall. Generated grocery list with pt writing list, with only one writing error exhibited for novel word.   03-16-22: Sharon Chan enters with inconsistent dysfluency. We discussed how holding a paper clips helps  distract her from focusing on her speech which increases fluency. She forgot her pill box for today's session, but she purchased a more simple pill box and has started a system of using a white board next to her calendar to write down when she takes her pills with success this week. She continues to report forgetting to eat and drink. She is taking protein meal replacement drinks instead of eating. We generated a meal plan for a week. Sharon Chan used to attend [facility] and used these recommendations for plan. She used to meal prep on Sundays. Instructed her to return to meal prep on Sundays for at least 5 days. Sharon Chan verbalized distraction of exuberant voice therapy next door, but did not require re-direction to meal plan task. In order to recall  amount of water, instructed her to put 64 oz in a clear pitcher so she can visualize how much/little she drinks in a day. Inconsistent , irregular L UE tremor vs dyskinesia noted during session.    03-14-22: Discussed evaluation results and ST goals. Pt attempting use of external compensations - to do lists, alarms/timers, and pill organizer for medication management with rare to occasional success. Pt has been calling mom when she takes her medications to aid recall as pt reports concern for consuming medications twice. SLP generated strategy to write down time on calendar to aid recall. SLP suggested pt bring pill box next session to target functional medication task for improved recall and attention. Difficulty with multitasking indicated. SLP educated and instructed attention and memory compensations to aid daily functioning. Pt identified she becomes easily distracted by environmental noises and internal distractions and able to generate appropriate solutions with rare min A. Will continue to target carryover of recommended techniques to aid efficiency and accuracy of household tasks. Initial stutter significantly declined as session progressed without  intervention as pt became more comfortable with new therapist.    PATIENT EDUCATION: Education details: see above Person educated: Patient Education method: Explanation, Demonstration, Handout Education comprehension: verbal cues required and needs further education      GOALS: Goals reviewed with patient? Yes   SHORT TERM GOALS: Target date: 04/06/2022   Pt will manage medications with no errors over 1 week with occasional min A Baseline: Goal status: ongoing   2.  Pt will use memory system to manage her appointments and daily tasks to complete 3-4 household tasks daily Baseline:  Goal status: ongoing   3.  Pt will use external aid to recall her showers to reduce showering to 1x a day or less Baseline:  Goal status: ongoing   4.  Pt will state personally relevant numbers accurately with occasional min A by getting them correct in her head before saying them aloud Baseline:  Goal status: ongoing   LONG TERM GOALS: Target date: 06/01/2022   Pt will independently use calendar, lists and other external aids to manage appointments and daily tasks over 1 week  Baseline:  Goal status: ongoing   2.  Pt will report no medication errors over 2 weeks Baseline:  Goal status: ongoing   3.  Pt will use fluent speech over 20 minute conversation with occasional min A over 2 sessions Baseline:  Goal status: ongoing   4.  Pt will improve score on Cognitive Function PROM by 4 points Baseline: 43 Goal status: ongoing   ASSESSMENT:   CLINICAL IMPRESSION: Patient is a 46 y.o. female who was seen today for cognitive impairments. Today she presents with functional cognitive communication impairment and functional mild dysfluency. Sharon Chan states her cognitive difficulty started in January of 2023 as she thinks she had another stroke. Speech with inconsistent stutter which significantly decreased as session progressed. Continued ongoing education and training of functional compensatory  techniques to aid completion of daily tasks. At this time, I recommend skilled ST to maximize cognition for safety, medication management and success managing schedule and daily tasks.    OBJECTIVE IMPAIRMENTS include attention, memory, and dysarthria. These impairments are limiting patient from managing medications, managing appointments, managing finances, household responsibilities, and ADLs/IADLs. Factors affecting potential to achieve goals and functional outcome are  anxiety .Marland Kitchen Patient will benefit from skilled SLP services to address above impairments and improve overall function.   REHAB POTENTIAL: Good   PLAN: SLP FREQUENCY: 2x/week   SLP DURATION: 12 weeks   PLANNED INTERVENTIONS: Environmental controls, Cueing hierachy, Cognitive reorganization, Internal/external aids, Functional tasks, Multimodal communication approach, and SLP instruction and feedback       Gracy Racer, [name] 03/31/2022, 1:08 PM
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
## 2022-04-01 ENCOUNTER — Ambulatory Visit: Payer: Medicare Other

## 2022-04-01 ENCOUNTER — Ambulatory Visit (INDEPENDENT_AMBULATORY_CARE_PROVIDER_SITE_OTHER): Payer: Medicare Other | Admitting: Neurology

## 2022-04-01 DIAGNOSIS — I69351 Hemiplegia and hemiparesis following cerebral infarction affecting right dominant side: Secondary | ICD-10-CM

## 2022-04-01 DIAGNOSIS — R569 Unspecified convulsions: Secondary | ICD-10-CM

## 2022-04-01 DIAGNOSIS — R519 Headache, unspecified: Secondary | ICD-10-CM

## 2022-04-01 DIAGNOSIS — Z8673 Personal history of transient ischemic attack (TIA), and cerebral infarction without residual deficits: Secondary | ICD-10-CM

## 2022-04-01 DIAGNOSIS — R41841 Cognitive communication deficit: Secondary | ICD-10-CM | POA: Diagnosis not present

## 2022-04-01 DIAGNOSIS — I6381 Other cerebral infarction due to occlusion or stenosis of small artery: Secondary | ICD-10-CM

## 2022-04-01 NOTE — Therapy (Signed)
OUTPATIENT SPEECH LANGUAGE PATHOLOGY TREATMENT NOTE   Patient Name: Sharon Chan MRN: [id number] DOB:1976-10-09, 46 y.o., female Today's Date: 04/01/2022  PCP: Angus Seller REFERRING PROVIDER: Angus Seller  END OF SESSION:   End of Session - 04/01/22 1148     Visit Number 5    Number of Visits 25    Date for SLP Re-Evaluation 06/01/22    Authorization Type medicare/medicaid    SLP Start Time 1146    SLP Stop Time  1230    SLP Time Calculation (min) 44 min    Activity Tolerance Patient tolerated treatment well             Past Medical History:  Diagnosis Date   Anxiety    CVA (cerebral vascular accident) (HCC)    Depression    DM type 2 (diabetes mellitus, type 2) (HCC)    HLD (hyperlipidemia)    HTN (hypertension)    Joint pain    Migraines    Neck pain    Obese    Polycystic disease, ovaries    Right sided weakness    Seizures (HCC)    Shoulder pain    Sickle cell trait (HCC)    Thyroid nodule    Vitamin D deficiency 01/2020   Past Surgical History:  Procedure Laterality Date   LAPAROSCOPIC CHOLECYSTECTOMY     Patient Active Problem List   Diagnosis Date Noted   Dehydration 06/12/2020   Hypertensive urgency 06/12/2020   History of recent fall 05/06/2020   Hemoglobin A1c less than 7.0% 02/03/2020   Hyperglycemia due to diabetes mellitus (HCC) 02/03/2020   Right sided weakness 10/21/2019   History of CVA with residual deficit 10/21/2019   Peripheral neuropathy 07/19/2019   Insomnia 07/19/2019   Edema 07/19/2019   Anxiety 07/19/2019   Class 3 severe obesity due to excess calories with serious comorbidity and body mass index (BMI) of 40.0 to 44.9 in adult [facility]) 07/19/2019   Shortness of breath on exertion 05/28/2018   Type 2 diabetes mellitus without complication, without long-term current use of insulin (HCC) 05/28/2018   Other fatigue 05/28/2018   Essential hypertension 05/28/2018   Vitamin D deficiency 05/28/2018   B12 nutritional  deficiency 05/28/2018   Hemiparesis affecting right side as late effect of cerebrovascular accident (HCC) 03/23/2017   Lacunar infarct, acute (HCC) 03/23/2017   Visual disturbance as complication of stroke 03/07/2017   Adjustment disorder with anxious mood    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 Yes NPRS scale: 10/10 Pain location: Headache Pain description: constant  Aggravating factors: lights Relieving factors: none   OBJECTIVE:   TODAY'S TREATMENT:  04-01-22: Returned with HEP completed to modify daily schedule for achievable tasks. Writing down has been effective to aid recall. Pt reports she is multitasking with success at home (ex: typing daily schedule, laundry, singing). Written  tasks reportedly take longer due to letter transpositions and word reversal errors. Pt again brought up her mind feels like multiple tabs (30+) open at a time. SLP recommended structured thought organization task to assist patient with compartmentalizing and prioritizing various thoughts. Pt benefited from association memory strategy to take insulin with breakfast this morning but only took 1 of 2 types of medications this morning. SLP provided education and recommendations to aid attention during medication administration (see handout). Rare dysfluency exhibited in conversation today.   03-31-22: Recalled recent MD appointment with good  recall of specifics indicated per chart review. Inconsistent occasional fading to rare dysfluency exhibited in conversation. Anxiety reportedly improves with staying busy with full schedule. SLP recommended modifying daily schedule to reflect obtainable task completion. Use of timers reported with good success. Some missed steps in ADL routines reported, including forgetting to don pants or shoes despite laying out clothes ahead of time and utilizing visual cues. "Much better" for medication management, although some difficulty with managing insulin. Assisted patient identify time to associate insulin management and eating for improved recall. Generated grocery list with pt writing list, with only one writing error exhibited for novel word.   03-16-22: Sharon Chan enters with inconsistent dysfluency. We discussed how holding a paper clips helps distract her from focusing on her speech which increases fluency. She forgot her pill box for today's session, but she purchased a more simple pill box and has started a system of using a white board next to her calendar to write down when she takes her pills with success this week. She continues to report forgetting to eat and drink. She is taking protein meal replacement drinks instead of eating. We generated a meal plan for a week. Sharon Chan used to attend [facility] and used these recommendations for plan. She used to meal prep on Sundays. Instructed her to return to meal prep on Sundays for at least 5 days. Sharon Chan verbalized distraction of exuberant voice therapy next door, but did not require re-direction to meal plan task. In order to recall  amount of water, instructed her to put 64 oz in a clear pitcher so she can visualize how much/little she drinks in a day. Inconsistent , irregular L UE tremor vs dyskinesia noted during session.    03-14-22: Discussed evaluation results and ST goals. Pt attempting use of external compensations - to do lists, alarms/timers, and  pill organizer for medication management with rare to occasional success. Pt has been calling mom when she takes her medications to aid recall as pt reports concern for consuming medications twice. SLP generated strategy to write down time on calendar to aid recall. SLP suggested pt bring pill box next session to target functional medication task for improved recall and attention. Difficulty with multitasking indicated. SLP educated and instructed attention and memory compensations to aid daily functioning. Pt identified she becomes easily distracted by environmental noises and internal distractions and able to generate appropriate solutions with rare min A. Will continue to target carryover of recommended techniques to aid efficiency and accuracy of household tasks. Initial stutter significantly declined as session progressed without intervention as pt became more comfortable with new therapist.    PATIENT EDUCATION: Education details: see above Person educated: Patient Education method: Explanation, Demonstration, Handout Education comprehension: verbal cues required and needs further education      GOALS: Goals reviewed with patient? Yes   SHORT TERM GOALS: Target date: 04/06/2022   Pt will manage medications with no errors over 1  week with occasional min A Baseline: Goal status: ongoing   2.  Pt will use memory system to manage her appointments and daily tasks to complete 3-4 household tasks daily Baseline:  Goal status: ongoing   3.  Pt will use external aid to recall her showers to reduce showering to 1x a day or less Baseline:  Goal status: Deferred (not a concern as of 04/01/22)   4.  Pt will state personally relevant numbers accurately with occasional min A by getting them correct in her head before saying them aloud Baseline:  Goal status: ongoing   LONG TERM GOALS: Target date: 06/01/2022   Pt will independently use calendar, lists and other external aids to manage  appointments and daily tasks over 1 week  Baseline:  Goal status: ongoing   2.  Pt will report no medication errors over 2 weeks Baseline:  Goal status: ongoing   3.  Pt will use fluent speech over 20 minute conversation with occasional min A over 2 sessions Baseline:  Goal status: ongoing   4.  Pt will improve score on Cognitive Function PROM by 4 points Baseline: 43 Goal status: ongoing   ASSESSMENT:   CLINICAL IMPRESSION: Patient is a 46 y.o. female who was seen today for cognitive impairments. Today she presents with functional cognitive communication impairment and functional mild dysfluency. Sharon Chan states her cognitive difficulty started in January of 2023 as she thinks she had another stroke. Speech with inconsistent stutter which rarely presented in ST session today. Continued ongoing education and training of functional compensatory techniques to aid completion of daily tasks. At this time, I recommend skilled ST to maximize cognition for safety, medication management and success managing schedule and daily tasks.    OBJECTIVE IMPAIRMENTS include attention, memory, and dysarthria. These impairments are limiting patient from managing medications, managing appointments, managing finances, household responsibilities, and ADLs/IADLs. Factors affecting potential to achieve goals and functional outcome are  anxiety .Marland Kitchen. Patient will benefit from skilled SLP services to address above impairments and improve overall function.   REHAB POTENTIAL: Good   PLAN: SLP FREQUENCY: 2x/week   SLP DURATION: 12 weeks   PLANNED INTERVENTIONS: Environmental controls, Cueing hierachy, Cognitive reorganization, Internal/external aids, Functional tasks, Multimodal communication approach, and SLP instruction and feedback       Gracy RacerKatherine I Johnson, [name] 04/01/2022, 11:49 AM

## 2022-04-01 NOTE — Patient Instructions (Signed)
Try a "brain dump" on paper  Write down ALL your thoughts on paper  Then, you can read back through the information and you can sort through your thoughts   Continue to take insulin in the morning with breakfast. Make sure you have taken all 3 pills in the morning (count all three in your hand or line them up). Focus on the task so you ensure all medications are taken. Write down the time you have taken your medication

## 2022-04-04 ENCOUNTER — Ambulatory Visit: Payer: Medicare Other | Admitting: Speech Pathology

## 2022-04-04 ENCOUNTER — Encounter: Payer: Self-pay | Admitting: Speech Pathology

## 2022-04-04 DIAGNOSIS — R41841 Cognitive communication deficit: Secondary | ICD-10-CM

## 2022-04-04 NOTE — Therapy (Signed)
OUTPATIENT SPEECH LANGUAGE PATHOLOGY TREATMENT NOTE   Patient Name: SERAYAH YAZDANI MRN: [id number] DOB:[birth year], 46 y.o., female Today's Date: 04/04/2022  PCP: Lazaro Arms REFERRING PROVIDER: Lazaro Arms  END OF SESSION:   End of Session - 04/04/22 1106     Visit Number 6    Number of Visits 25    Date for SLP Re-Evaluation 06/01/22    Authorization Type medicare/medicaid    SLP Start Time 1105    SLP Stop Time  1135    SLP Time Calculation (min) 30 min    Activity Tolerance Patient tolerated treatment well             Past Medical History:  Diagnosis Date   Anxiety    CVA (cerebral vascular accident) ([place])    Depression    DM type 2 (diabetes mellitus, type 2) ([place])    HLD (hyperlipidemia)    HTN (hypertension)    Joint pain    Migraines    Neck pain    Obese    Polycystic disease, ovaries    Right sided weakness    Seizures (HCC)    Shoulder pain    Sickle cell trait ([place])    Thyroid nodule    Vitamin D deficiency 01/2020   Past Surgical History:  Procedure Laterality Date   LAPAROSCOPIC CHOLECYSTECTOMY     Patient Active Problem List   Diagnosis Date Noted   Dehydration 06/12/2020   Hypertensive urgency 06/12/2020   History of recent fall 05/06/2020   Hemoglobin A1c less than 7.0% 02/03/2020   Hyperglycemia due to diabetes mellitus ([place]) 02/03/2020   Right sided weakness 10/21/2019   History of CVA with residual deficit 10/21/2019   Peripheral neuropathy 07/19/2019   Insomnia 07/19/2019   Edema 07/19/2019   Anxiety 07/19/2019   Class 3 severe obesity due to excess calories with serious comorbidity and body mass index (BMI) of 40.0 to 44.9 in adult [facility]) 07/19/2019   Shortness of breath on exertion 05/28/2018   Type 2 diabetes mellitus without complication, without long-term current use of insulin ([place]) 05/28/2018   Other fatigue 05/28/2018   Essential hypertension 05/28/2018   Vitamin D deficiency 05/28/2018   B12 nutritional  deficiency 05/28/2018   Hemiparesis affecting right side as late effect of cerebrovascular accident ([place]) 03/23/2017   Lacunar infarct, acute ([place]) 03/23/2017   Visual disturbance as complication of stroke 14/78/2956   Adjustment disorder with anxious mood    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 Yes NPRS scale: 10/10 Pain location: Headache Pain description: constant  Aggravating factors: lights Relieving factors: none   OBJECTIVE:   TODAY'S TREATMENT:   04-04-22: Deandra reports she left the house with a top and shoes on with no pants but changed into her dress that she had laid out, but did not put on. Rolena completed her [facility]  to meal plan and ordered meals to be delivered with success, but she doesn't have an appetite. She is scheduling 1 meal a day with a neighbor and some meals with her pastor to help encourage her to eat. She is taking Dannon yogurt smoothie and taking her insulin and 3 other meds with this routine after her walk. She is keeping her am and pm schedules and writing down priorities for the week to get chores done. She is not repetitively doing chores and endorses she is completing 3 chores a day and has not made med errors this week. She continues to see numbers upside down and backward despite using the numbers written correctly on paper as a visual cue.  2 episodes of mild dysfluency today over 45  minutes  04-01-22: Returned with HEP completed to modify daily schedule for achievable tasks. Writing down has been effective to aid recall. Pt reports she is multitasking with success at home (ex: typing daily schedule, laundry, singing). Written tasks reportedly take longer due to letter transpositions and word reversal errors. Pt again brought up her mind feels like multiple tabs (30+) open at a time. SLP recommended structured thought organization task to assist patient with compartmentalizing and prioritizing various thoughts. Pt benefited from association memory strategy to take insulin with breakfast this morning but only took 1 of 2 types of medications this morning. SLP provided education and recommendations to aid attention during medication administration (see handout). Rare dysfluency exhibited in conversation today.   03-31-22: Recalled recent MD appointment with good recall of specifics indicated per chart review. Inconsistent occasional fading to rare dysfluency exhibited in conversation. Anxiety reportedly improves with staying busy with full schedule. SLP recommended modifying daily schedule to reflect obtainable task completion. Use of timers reported with good success. Some missed steps in ADL routines reported, including forgetting to don pants or shoes despite laying out clothes ahead of time and utilizing visual cues. "Much better" for medication management, although some difficulty with managing insulin. Assisted patient identify time to associate insulin management and eating for improved recall. Generated grocery list with pt writing list, with only one writing error exhibited for novel word.   03-16-22: Brookley enters with inconsistent dysfluency. We discussed how holding a paper clips helps distract her from focusing on her speech which increases fluency. She forgot her pill box for today's session, but she purchased a more simple pill box and has started a system of using a white board  next to her calendar to write down when she takes her pills with success this week. She continues to report forgetting to eat and drink. She is taking protein meal replacement drinks instead of eating. We generated a meal plan for a week. Jude used to attend [facility] and used these recommendations for plan. She used to meal prep on Sundays. Instructed her to return to meal prep on Sundays for at least 5 days. Amariona verbalized distraction of exuberant voice therapy next door, but did not require re-direction to meal plan task. In order to recall  amount of water, instructed her to put 64 oz in a clear pitcher so she can visualize how much/little she drinks in a day. Inconsistent , irregular L UE tremor vs dyskinesia noted during session.   PATIENT EDUCATION: Education details: see above Person educated: Patient Education method: Explanation, Demonstration, Handout Education comprehension: verbal cues required and needs further education      GOALS: Goals reviewed with patient? Yes   SHORT TERM GOALS:  Target date: 04/06/2022   Pt will manage medications with no errors over 1 week with occasional min A Baseline: Goal status: Met   2.  Pt will use memory system to manage her appointments and daily tasks to complete 3-4 household tasks daily Baseline:  Goal status: MET   3.  Pt will use external aid to recall her showers to reduce showering to 1x a day or less Baseline:  Goal status: Deferred (not a concern as of 04/01/22)   4.  Pt will state personally relevant numbers accurately with occasional min A by getting them correct in her head before saying them aloud Baseline:  Goal status: NOT MET   LONG TERM GOALS: Target date: 06/01/2022   Pt will independently use calendar, lists and other external aids to manage appointments and daily tasks over 1 week  Baseline:  Goal status: ongoing   2.  Pt will report no medication errors over 2 weeks Baseline:  Goal status:  ongoing   3.  Pt will use fluent speech over 20 minute conversation with occasional min A over 2 sessions Baseline:  Goal status: ongoing   4.  Pt will improve score on Cognitive Function PROM by 4 points Baseline: 43 Goal status: ongoing   ASSESSMENT:   CLINICAL IMPRESSION: Patient is a 46 y.o. female who was seen today for cognitive impairments.  She presents with functional cognitive communication impairment and functional mild dysfluency. She is carrying over strategies for memory, med management  this past week. She has ordered meals and is eating 1-2 meals a day consistently.Continued ongoing education and training of functional compensatory techniques to aid completion of daily tasks. At this time, I recommend skilled ST to maximize cognition for safety, medication management and success managing schedule and daily tasks.    OBJECTIVE IMPAIRMENTS include attention, memory, and dysarthria. These impairments are limiting patient from managing medications, managing appointments, managing finances, household responsibilities, and ADLs/IADLs. Factors affecting potential to achieve goals and functional outcome are  anxiety .Marland Kitchen Patient will benefit from skilled SLP services to address above impairments and improve overall function.   REHAB POTENTIAL: Good   PLAN: SLP FREQUENCY: 2x/week   SLP DURATION: 12 weeks   PLANNED INTERVENTIONS: Environmental controls, Cueing hierachy, Cognitive reorganization, Internal/external aids, Functional tasks, Multimodal communication approach, and SLP instruction and feedback       [place], Annye Rusk, [place] 04/04/2022, 11:49 AM

## 2022-04-06 ENCOUNTER — Ambulatory Visit: Payer: Medicare Other | Admitting: Speech Pathology

## 2022-04-06 ENCOUNTER — Encounter: Payer: Self-pay | Admitting: Speech Pathology

## 2022-04-06 DIAGNOSIS — R41841 Cognitive communication deficit: Secondary | ICD-10-CM | POA: Diagnosis not present

## 2022-04-06 NOTE — Patient Instructions (Signed)
  To work on word finding, write the alphabet and think of a category and write words in the category that begins with each letter  Categories to try: Bible words, Christmas words, Easter words, Halloween words, famous people, [employer], Medical words, etc  Or pick a letter (P, T, S, R, L) and try some lists that I sent home with you  When your medical condition is more stable and you are feeling like you can benefit from ST, get a new order to return from your MD

## 2022-04-06 NOTE — Therapy (Signed)
OUTPATIENT SPEECH LANGUAGE PATHOLOGY TREATMENT NOTE   Patient Name: Sharon Chan MRN: [id number] DOB:11-14-1976, 46 y.o., female Today's Date: 04/06/2022  PCP: Lazaro Arms REFERRING PROVIDER: Lazaro Arms  END OF SESSION:   End of Session - 04/06/22 1103     Visit Number 7    Number of Visits 25    Date for SLP Re-Evaluation 06/01/22    Authorization Type medicare/medicaid    SLP Start Time 41    SLP Stop Time  1140    SLP Time Calculation (min) 40 min    Activity Tolerance Patient tolerated treatment well             Past Medical History:  Diagnosis Date   Anxiety    CVA (cerebral vascular accident) ([place])    Depression    DM type 2 (diabetes mellitus, type 2) ([place])    HLD (hyperlipidemia)    HTN (hypertension)    Joint pain    Migraines    Neck pain    Obese    Polycystic disease, ovaries    Right sided weakness    Seizures (HCC)    Shoulder pain    Sickle cell trait (HCC)    Thyroid nodule    Vitamin D deficiency 01/2020   Past Surgical History:  Procedure Laterality Date   LAPAROSCOPIC CHOLECYSTECTOMY     Patient Active Problem List   Diagnosis Date Noted   Dehydration 06/12/2020   Hypertensive urgency 06/12/2020   History of recent fall 05/06/2020   Hemoglobin A1c less than 7.0% 02/03/2020   Hyperglycemia due to diabetes mellitus ([place]) 02/03/2020   Right sided weakness 10/21/2019   History of CVA with residual deficit 10/21/2019   Peripheral neuropathy 07/19/2019   Insomnia 07/19/2019   Edema 07/19/2019   Anxiety 07/19/2019   Class 3 severe obesity due to excess calories with serious comorbidity and body mass index (BMI) of 40.0 to 44.9 in adult [facility]) 07/19/2019   Shortness of breath on exertion 05/28/2018   Type 2 diabetes mellitus without complication, without long-term current use of insulin ([place]) 05/28/2018   Other fatigue 05/28/2018   Essential hypertension 05/28/2018   Vitamin D deficiency 05/28/2018   B12 nutritional  deficiency 05/28/2018   Hemiparesis affecting right side as late effect of cerebrovascular accident ([place]) 03/23/2017   Lacunar infarct, acute ([place]) 03/23/2017   Visual disturbance as complication of stroke 88/89/1694   Adjustment disorder with anxious mood    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 Yes NPRS scale: 10/10 Pain location: Headache Pain description: constant  Aggravating factors: lights Relieving factors: none   OBJECTIVE:   TODAY'S TREATMENT:   04-06-22: "I had a seizure and I wet myself. I didn't want to call a neurologist." She couldn't see for a long while last night and reportedly called a neurologist at [facility]. She  reports she was doing well keeping lists, calendars to manage appointments however this has worsened recently since h/a have worsened. The same with medications, she remains inconsistent with meds, which she states were going well (see last session) and now she is making mistakes. She did not take her yogurt smoothie nor her meds today. Discussed with Sharon Chan that her medical issues are inhibiting her progress in ST, as she makes some progress, then backslides with seizures and other medical episodes. Will consider putting ST on hold until medical and psych issues are addressed. Targeted complex word finding and written expression in convergent naming task with given letter. She  required verbal cues to ID letters she wrote upside down and backwards (atypical)  04-04-22: Sharon Chan reports she left the house with a top and shoes on with no pants . She had  dress that she had laid out, but did not put on. [place] completed her HW to meal plan and ordered meals to be delivered with success, but she doesn't have an appetite. She is scheduling 1 meal a day with a neighbor and some meals with her pastor to help encourage her to eat. She is taking Dannon yogurt smoothie and taking her insulin and 3 other meds with this routine after her walk. She is keeping her am and pm schedules and writing down priorities for the week to get chores done. She is not repetitively doing chores and endorses she is completing 3 chores a day and has not made med errors this week. She continues to see numbers upside down and backward despite using the numbers written correctly on paper as a visual cue.  2 episodes of mild dysfluency today over 45 minutes  04-01-22: Returned with HEP completed to modify daily schedule for achievable tasks. Writing down has been effective to aid recall. Pt reports she is multitasking with success at home (ex: typing daily schedule, laundry, singing). Written tasks reportedly take longer due to letter transpositions and word reversal errors. Pt again brought up her mind feels like multiple tabs (30+) open at a time. SLP recommended structured thought organization task to assist patient with compartmentalizing and prioritizing various thoughts. Pt benefited from association memory strategy to take insulin with breakfast this morning but only took 1 of 2 types of medications this morning. SLP provided education and recommendations to aid attention during medication administration (see handout). Rare dysfluency exhibited in conversation today.   03-31-22: Recalled recent MD appointment with good recall of specifics indicated per chart review. Inconsistent occasional fading to rare  dysfluency exhibited in conversation. Anxiety reportedly improves with staying busy with full schedule. SLP recommended modifying daily schedule to reflect obtainable task completion. Use of timers reported with good success. Some missed steps in ADL routines reported, including forgetting to don pants or shoes despite laying out clothes ahead of time and utilizing visual cues. "Much better" for medication management, although some difficulty with managing insulin. Assisted patient identify time to associate insulin management and eating for improved recall. Generated grocery list with pt writing list, with only one writing error exhibited for novel word.   03-16-22: Sharon Chan enters with inconsistent dysfluency. We discussed how holding a paper clips helps distract her from focusing on her speech which increases fluency. She forgot her pill box for today's session, but she purchased a more simple pill box and has started a system of using a white board next to her calendar to write down when she takes her pills with success this week. She continues  to report forgetting to eat and drink. She is taking protein meal replacement drinks instead of eating. We generated a meal plan for a week. Sharon Chan used to attend [facility] and used these recommendations for plan. She used to meal prep on Sundays. Instructed her to return to meal prep on Sundays for at least 5 days. Sharon Chan verbalized distraction of exuberant voice therapy next door, but did not require re-direction to meal plan task. In order to recall  amount of water, instructed her to put 64 oz in a clear pitcher so she can visualize how much/little she drinks in a day. Inconsistent , irregular L UE tremor vs dyskinesia noted during session.   PATIENT EDUCATION: Education details: see above Person educated: Patient Education method: Explanation, Demonstration, Handout Education comprehension: verbal cues required and needs further education       GOALS: Goals reviewed with patient? Yes   SHORT TERM GOALS: Target date: 04/06/2022   Pt will manage medications with no errors over 1 week with occasional min A Baseline: Goal status: Met   2.  Pt will use memory system to manage her appointments and daily tasks to complete 3-4 household tasks daily Baseline:  Goal status: MET   3.  Pt will use external aid to recall her showers to reduce showering to 1x a day or less Baseline:  Goal status: Deferred (not a concern as of 04/01/22)   4.  Pt will state personally relevant numbers accurately with occasional min A by getting them correct in her head before saying them aloud Baseline:  Goal status: NOT MET   LONG TERM GOALS: Target date: 06/01/2022   Pt will independently use calendar, lists and other external aids to manage appointments and daily tasks over 1 week  Baseline:  Goal status:NOT MET   2.  Pt will report no medication errors over 2 weeks Baseline:  Goal status: NOT MET   3.  Pt will use fluent speech over 20 minute conversation with occasional min A over 2 sessions Baseline:  Goal status: NOT MET   4.  Pt will improve score on Cognitive Function PROM by 4 points Baseline: 43 Goal status: NOT MET   ASSESSMENT:   CLINICAL IMPRESSION: Patient is a 46 y.o. female who was seen today for cognitive impairments.  She presents with functional cognitive communication impairment and functional mild dysfluency. We have trained her in compensations for attention, recall for meal, med and financial management as well as compensations for fluency. Sharon Chan has been inconsistent in following strategies, having success some weeks, and not able to follow compensations other weeks. Presentation remains inconsistent and atypical.  At this time, medical issues of perceived seizures, mini strokes and anxiety are affecting her progress in ST. I recommend putting skilled ST on hold until medical and psych issues are addressed and stable.   Sharon Chan knows she can call her MD and get a new order to resume ST if she does not return 30 days.    OBJECTIVE IMPAIRMENTS include functional attention, memory, and dysarthria. These impairments are limiting patient from managing medications, managing appointments, managing finances, household responsibilities, and ADLs/IADLs. Factors affecting potential to achieve goals and functional outcome are  anxiety .Marland Kitchen Patient will benefit from skilled SLP services to address above impairments and improve overall function.   REHAB POTENTIAL: Fair   PLAN: SLP FREQUENCY: 2x/week   SLP DURATION: 12 weeks   PLANNED INTERVENTIONS: Environmental controls, Cueing hierachy, Cognitive reorganization, Internal/external aids, Functional tasks, Multimodal communication approach,  and SLP instruction and feedback       [place], Annye Rusk, [place] 04/06/2022, 12:57 PM

## 2022-04-13 NOTE — Procedures (Signed)
ELECTROENCEPHALOGRAM REPORT  Date of Study: 04/01/2022  Patient's Name: Sharon Chan MRN: [id number] Date of Birth: 28-Oct-1976  Referring Provider: Dr. Patrcia Dolly  Clinical History: This is a 46 year old woman with convulsions and right-sided weakness. EEG for classification.  Medications: TYLENOL 325 MG tablet PROVENTIL HFA;VENTOLIN HFA 108 (90 Base) MCG/ACT inhaler LIPITOR 40 MG tablet WELLBUTRIN SR 200 MG 12 hr tablet FLEXERIL 5 MG tablet BENADRYL 25 MG tablet LASIX 20 MG tablet HYDRODIURIL 12.5 MG tablet VICTOZA 18 MG/3ML SOPN COZAAR 50 MG tableqvt GLUCOPHAGE 500 MG tablet TOPAMAX 100 MG tablet DRISDOL 1.25 MG (50000 UNIT) CAPS capsule AMBIEN 5 MG tablet  Technical Summary: A multichannel digital 1-hour EEG recording measured by the international 10-20 system with electrodes applied with paste and impedances below 5000 ohms performed in our laboratory with EKG monitoring in an awake and asleep patient.  Hyperventilation was not performed. Photic stimulation was performed.  The digital EEG was referentially recorded, reformatted, and digitally filtered in a variety of bipolar and referential montages for optimal display.    Description: The patient is awake and asleep during the recording.  During maximal wakefulness, there is a symmetric, medium voltage 9.5 Hz posterior dominant rhythm that attenuates with eye opening.  The record is symmetric. There is electrode artifact at P8.  During drowsiness and sleep, there is an increase in theta slowing of the background.  Vertex waves and symmetric sleep spindles were seen. Photic stimulation did not elicit any abnormalities.  There were no epileptiform discharges or electrographic seizures seen.    EKG lead was unremarkable.  Impression: This 1-hour awake and asleep EEG is normal.    Clinical Correlation: A normal EEG does not exclude a clinical diagnosis of epilepsy.  If further clinical questions remain, prolonged EEG  may be helpful.  Clinical correlation is advised.   Patrcia Dolly, M.D.

## 2022-04-17 ENCOUNTER — Other Ambulatory Visit: Payer: Medicare Other

## 2022-04-18 ENCOUNTER — Encounter: Payer: Medicare Other | Admitting: Speech Pathology

## 2022-04-20 ENCOUNTER — Encounter: Payer: Medicare Other | Admitting: Speech Pathology

## 2022-04-21 ENCOUNTER — Ambulatory Visit
Admission: RE | Admit: 2022-04-21 | Discharge: 2022-04-21 | Disposition: A | Discharge status: Home / Self Care | Payer: Medicare Other | Source: Ambulatory Visit | Attending: Neurology | Admitting: Neurology

## 2022-04-21 DIAGNOSIS — I69351 Hemiplegia and hemiparesis following cerebral infarction affecting right dominant side: Secondary | ICD-10-CM

## 2022-04-21 DIAGNOSIS — I6381 Other cerebral infarction due to occlusion or stenosis of small artery: Secondary | ICD-10-CM

## 2022-04-21 DIAGNOSIS — Z8673 Personal history of transient ischemic attack (TIA), and cerebral infarction without residual deficits: Secondary | ICD-10-CM

## 2022-04-21 DIAGNOSIS — R569 Unspecified convulsions: Secondary | ICD-10-CM

## 2022-04-21 DIAGNOSIS — R519 Headache, unspecified: Secondary | ICD-10-CM

## 2022-04-21 DIAGNOSIS — G9389 Other specified disorders of brain: Secondary | ICD-10-CM | POA: Diagnosis not present

## 2022-04-21 DIAGNOSIS — R202 Paresthesia of skin: Secondary | ICD-10-CM | POA: Diagnosis not present

## 2022-04-21 MED ORDER — GADOBENATE DIMEGLUMINE 529 MG/ML IV SOLN
20.0000 mL | Freq: Once | INTRAVENOUS | Status: AC | PRN
  Administered 2022-04-21: 20 mL via INTRAVENOUS

## 2022-04-25 ENCOUNTER — Encounter: Payer: Medicare Other | Admitting: Speech Pathology

## 2022-04-27 ENCOUNTER — Encounter: Payer: Medicare Other | Admitting: Speech Pathology

## 2022-04-27 ENCOUNTER — Other Ambulatory Visit: Payer: Self-pay | Admitting: Nurse Practitioner

## 2022-04-27 DIAGNOSIS — F419 Anxiety disorder, unspecified: Secondary | ICD-10-CM

## 2022-04-27 DIAGNOSIS — Z7189 Other specified counseling: Secondary | ICD-10-CM

## 2022-04-29 ENCOUNTER — Other Ambulatory Visit: Payer: Self-pay | Admitting: Neurology

## 2022-05-05 ENCOUNTER — Other Ambulatory Visit: Payer: Self-pay

## 2022-05-05 MED ORDER — ACETAMINOPHEN 325 MG PO TABS: 325.0000 mg | ORAL_TABLET | ORAL | Status: DC | PRN

## 2022-05-11 ENCOUNTER — Other Ambulatory Visit: Payer: Medicare Other | Admitting: Neurology

## 2022-05-25 ENCOUNTER — Ambulatory Visit (INDEPENDENT_AMBULATORY_CARE_PROVIDER_SITE_OTHER): Payer: Medicare Other | Admitting: Neurology

## 2022-05-25 DIAGNOSIS — R404 Transient alteration of awareness: Secondary | ICD-10-CM | POA: Diagnosis not present

## 2022-05-25 DIAGNOSIS — R569 Unspecified convulsions: Secondary | ICD-10-CM

## 2022-05-26 DIAGNOSIS — R404 Transient alteration of awareness: Secondary | ICD-10-CM | POA: Diagnosis not present

## 2022-05-26 DIAGNOSIS — R569 Unspecified convulsions: Secondary | ICD-10-CM | POA: Diagnosis not present

## 2022-06-02 ENCOUNTER — Ambulatory Visit (INDEPENDENT_AMBULATORY_CARE_PROVIDER_SITE_OTHER): Payer: Medicare Other | Admitting: Nurse Practitioner

## 2022-06-02 ENCOUNTER — Encounter: Payer: Self-pay | Admitting: Nurse Practitioner

## 2022-06-02 VITALS — BP 138/96 | HR 84 | Resp 18

## 2022-06-02 DIAGNOSIS — G4700 Insomnia, unspecified: Secondary | ICD-10-CM | POA: Diagnosis not present

## 2022-06-02 DIAGNOSIS — R350 Frequency of micturition: Secondary | ICD-10-CM

## 2022-06-02 DIAGNOSIS — Z87718 Personal history of other specified (corrected) congenital malformations of genitourinary system: Secondary | ICD-10-CM

## 2022-06-02 DIAGNOSIS — I10 Essential (primary) hypertension: Secondary | ICD-10-CM | POA: Diagnosis not present

## 2022-06-02 DIAGNOSIS — E119 Type 2 diabetes mellitus without complications: Secondary | ICD-10-CM | POA: Diagnosis not present

## 2022-06-02 LAB — POCT GLYCOSYLATED HEMOGLOBIN (HGB A1C)
HbA1c POC (<> result, manual entry): 6.3 % (ref 4.0–5.6)
HbA1c, POC (controlled diabetic range): 6.3 % (ref 0.0–7.0)
HbA1c, POC (prediabetic range): 6.3 % (ref 5.7–6.4)
Hemoglobin A1C: 6.3 % — AB (ref 4.0–5.6)

## 2022-06-02 NOTE — Progress Notes (Signed)
@Patient ID: Sharon Chan, female    DOB: 12/11/1975, 46 y.o.   MRN: [id number]  Chief Complaint  Patient presents with   Follow-up    Referring provider: No ref. provider found   HPI  46-year-old female with history of hypertension, CVA, migraines, diabetes, polycystic ovary, thyroid nodule, peripheral neuropathy, right-sided weakness, polycystic kidney disease, anxiety.  Patient presents today for follow-up visit.  She has been doing better since her last visit here.  She was able to get back in with neurology.  She has had an EEG, MRI, and neurologist added nortriptyline to medications.  Patient has started speech therapy for cognitive rehabilitation and stutter.  Patient states that she still has a hard time sleeping at night.  We will refer her to sleep specialist.  Blood pressure is much improved in office today. Denies f/c/s, n/v/d, hemoptysis, PND, leg swelling Denies chest pain or edema  Patient is requesting a referral to nephrology.  She does have urinary frequency and does have a history of polycystic kidney disease.  She failed to follow-up with nephrology in the past.         Allergies  Allergen Reactions   Penicillins Anaphylaxis    Immunization History  Administered Date(s) Administered   Influenza,inj,Quad PF,6+ Mos 09/12/2017, 10/01/2018, 11/20/2019   PFIZER(Purple Top)SARS-COV-2 Vaccination 02/06/2020, 02/25/2020    Past Medical History:  Diagnosis Date   Anxiety    CVA (cerebral vascular accident) (HCC)    Depression    DM type 2 (diabetes mellitus, type 2) (HCC)    HLD (hyperlipidemia)    HTN (hypertension)    Joint pain    Migraines    Neck pain    Obese    Polycystic disease, ovaries    Right sided weakness    Seizures (HCC)    Shoulder pain    Sickle cell trait (HCC)    Thyroid nodule    Vitamin D deficiency 01/2020    Tobacco History: Social History   Tobacco Use  Smoking Status Never  Smokeless Tobacco Never   Counseling  given: Not Answered   Outpatient Encounter Medications as of 06/02/2022  Medication Sig   acetaminophen (TYLENOL) 325 MG tablet Take 1-2 tablets (325-650 mg total) by mouth every 4 (four) hours as needed for mild pain.   albuterol (PROVENTIL HFA;VENTOLIN HFA) 108 (90 Base) MCG/ACT inhaler Inhale 2 puffs into the lungs every 6 (six) hours as needed for wheezing or shortness of breath.   atorvastatin (LIPITOR) 40 MG tablet Take 1 tablet (40 mg total) by mouth daily at 6 PM.   blood glucose meter kit and supplies KIT Dispense based on patient and insurance preference. Use up to four times daily as directed. (FOR ICD-9 250.00, 250.01).   buPROPion (WELLBUTRIN SR) 200 MG 12 hr tablet TAKE 1 TABLET BY MOUTH TWICE A DAY   cyclobenzaprine (FLEXERIL) 5 MG tablet Take 1 tablet (5 mg total) by mouth 3 (three) times daily as needed for muscle spasms.   diphenhydrAMINE (BENADRYL) 25 MG tablet Take 25 mg by mouth every 6 (six) hours as needed.   furosemide (LASIX) 20 MG tablet TAKE 1 TABLET BY MOUTH EVERY DAY   glucose blood (ONE TOUCH ULTRA TEST) test strip TEST TWICE A DAY   hydrochlorothiazide (HYDRODIURIL) 12.5 MG tablet Take 1 tablet (12.5 mg total) by mouth daily.   Insulin Pen Needle (BD PEN NEEDLE NANO U/F) 32G X 4 MM MISC Inject 1 Units into the skin 2 (two) times daily.     liraglutide (VICTOZA) 18 MG/3ML SOPN Inject 1.2 mg into the skin every morning.   losartan (COZAAR) 50 MG tablet Take 1 tablet (50 mg total) by mouth in the morning and at bedtime.   metFORMIN (GLUCOPHAGE) 500 MG tablet Take 1 tablet (500 mg total) by mouth 2 (two) times daily with a meal.   nortriptyline (PAMELOR) 25 MG capsule Take 1 capsule every night for 1 week, then increase to 2 capsules every night for 1 week, then increase to 3 capsules every night and continue   OneTouch Delica Lancets 33G MISC TES 2 (TWO) TIMES DAILY.   sulfamethoxazole-trimethoprim (BACTRIM DS) 800-160 MG tablet Take 1 tablet by mouth 2 (two) times  daily. (Patient not taking: Reported on 03/31/2022)   topiramate (TOPAMAX) 100 MG tablet TAKE 1 TABLET IN MORNING AND 2 TABLETS AT NIGHT   Vitamin D, Ergocalciferol, (DRISDOL) 1.25 MG (50000 UNIT) CAPS capsule Take 1 capsule (50,000 Units total) by mouth every 7 (seven) days.   zolpidem (AMBIEN) 5 MG tablet TAKE 1 TABLET BY MOUTH EVERY DAY AT BEDTIME AS NEEDED FOR SLEEP   Facility-Administered Encounter Medications as of 06/02/2022  Medication   cloNIDine (CATAPRES) tablet 0.1 mg     Review of Systems  Review of Systems  Constitutional: Negative.   HENT: Negative.    Cardiovascular: Negative.   Gastrointestinal: Negative.   Allergic/Immunologic: Negative.   Neurological: Negative.   Psychiatric/Behavioral: Negative.         Physical Exam  BP (!) 138/96   Pulse 84   Resp 18   SpO2 99%   Wt Readings from Last 5 Encounters:  03/31/22 215 lb (97.5 kg)  03/02/22 222 lb 8 oz (100.9 kg)  06/03/20 235 lb 9.6 oz (106.9 kg)  05/05/20 237 lb (107.5 kg)  02/03/20 232 lb 9.6 oz (105.5 kg)     Physical Exam Vitals and nursing note reviewed.  Constitutional:      General: She is not in acute distress.    Appearance: She is well-developed.  Cardiovascular:     Rate and Rhythm: Normal rate and regular rhythm.  Pulmonary:     Effort: Pulmonary effort is normal.     Breath sounds: Normal breath sounds.  Neurological:     Mental Status: She is alert and oriented to person, place, and time.      Lab Results:  CBC    Component Value Date/Time   WBC 6.2 06/02/2022 1017   WBC 5.3 09/12/2017 1121   RBC 5.37 (H) 06/02/2022 1017   RBC 4.90 09/12/2017 1121   HGB 12.8 06/02/2022 1017   HCT 39.9 06/02/2022 1017   PLT 326 06/02/2022 1017   MCV 74 (L) 06/02/2022 1017   MCH 23.8 (L) 06/02/2022 1017   MCH 25.1 (L) 09/12/2017 1121   MCHC 32.1 06/02/2022 1017   MCHC 32.6 09/12/2017 1121   RDW 18.6 (H) 06/02/2022 1017   LYMPHSABS 2.7 02/03/2020 1412   MONOABS 396 03/17/2017 1513    EOSABS 0.2 02/03/2020 1412   BASOSABS 0.1 02/03/2020 1412    BMET    Component Value Date/Time   NA 142 06/02/2022 1017   K 4.4 06/02/2022 1017   CL 106 06/02/2022 1017   CO2 23 06/02/2022 1017   GLUCOSE 96 06/02/2022 1017   GLUCOSE 101 (H) 09/12/2017 1121   BUN 18 06/02/2022 1017   CREATININE 1.03 (H) 06/02/2022 1017   CREATININE 0.92 09/12/2017 1121   CALCIUM 9.4 06/02/2022 1017   GFRNONAA 72 02/03/2020 1412     GFRNONAA 71 03/17/2017 1513   GFRAA 83 02/03/2020 1412   GFRAA 82 03/17/2017 1513    BNP No results found for: "BNP"  ProBNP No results found for: "PROBNP"  Imaging: No results found.   Assessment & Plan:   History of polycystic kidney disease - Ambulatory referral to Nephrology  2. Urinary frequency  - Ambulatory referral to Nephrology  3. Insomnia, unspecified type  - Ambulatory referral to Pulmonology  4. Hypertension, unspecified type  - CBC - Comprehensive metabolic panel  5. Type 2 diabetes mellitus without complication, without long-term current use of insulin (HCC)  - CBC - Comprehensive metabolic panel  Follow up:  Follow up in 3 months or sooner if needed     Fenton Foy, NP 06/03/2022

## 2022-06-02 NOTE — Patient Instructions (Addendum)
1. History of polycystic kidney disease  - Ambulatory referral to Nephrology  2. Urinary frequency  - Ambulatory referral to Nephrology  3. Insomnia, unspecified type  - Ambulatory referral to Pulmonology  4. Hypertension, unspecified type  - CBC - Comprehensive metabolic panel  5. Type 2 diabetes mellitus without complication, without long-term current use of insulin (HCC)  - CBC - Comprehensive metabolic panel  Follow up:  Follow up in 3 months or sooner if needed

## 2022-06-03 DIAGNOSIS — Z87718 Personal history of other specified (corrected) congenital malformations of genitourinary system: Secondary | ICD-10-CM | POA: Insufficient documentation

## 2022-06-03 LAB — COMPREHENSIVE METABOLIC PANEL
ALT: 9 IU/L (ref 0–32)
AST: 10 IU/L (ref 0–40)
Albumin/Globulin Ratio: 1.8 (ref 1.2–2.2)
Albumin: 4.2 g/dL (ref 3.9–4.9)
Alkaline Phosphatase: 93 IU/L (ref 44–121)
BUN/Creatinine Ratio: 17 (ref 9–23)
BUN: 18 mg/dL (ref 6–24)
Bilirubin Total: 0.3 mg/dL (ref 0.0–1.2)
CO2: 23 mmol/L (ref 20–29)
Calcium: 9.4 mg/dL (ref 8.7–10.2)
Chloride: 106 mmol/L (ref 96–106)
Creatinine, Ser: 1.03 mg/dL — ABNORMAL HIGH (ref 0.57–1.00)
Globulin, Total: 2.4 g/dL (ref 1.5–4.5)
Glucose: 96 mg/dL (ref 70–99)
Potassium: 4.4 mmol/L (ref 3.5–5.2)
Sodium: 142 mmol/L (ref 134–144)
Total Protein: 6.6 g/dL (ref 6.0–8.5)
eGFR: 67 mL/min/{1.73_m2} (ref >59)

## 2022-06-03 LAB — CBC
Hematocrit: 39.9 % (ref 34.0–46.6)
Hemoglobin: 12.8 g/dL (ref 11.1–15.9)
MCH: 23.8 pg — ABNORMAL LOW (ref 26.6–33.0)
MCHC: 32.1 g/dL (ref 31.5–35.7)
MCV: 74 fL — ABNORMAL LOW (ref 79–97)
Platelets: 326 10*3/uL (ref 150–450)
RBC: 5.37 x10E6/uL — ABNORMAL HIGH (ref 3.77–5.28)
RDW: 18.6 % — ABNORMAL HIGH (ref 11.7–15.4)
WBC: 6.2 10*3/uL (ref 3.4–10.8)

## 2022-06-03 NOTE — Assessment & Plan Note (Signed)
-   Ambulatory referral to Nephrology  2. Urinary frequency  - Ambulatory referral to Nephrology  3. Insomnia, unspecified type  - Ambulatory referral to Pulmonology  4. Hypertension, unspecified type  - CBC - Comprehensive metabolic panel  5. Type 2 diabetes mellitus without complication, without long-term current use of insulin (HCC)  - CBC - Comprehensive metabolic panel  Follow up:  Follow up in 3 months or sooner if needed

## 2022-06-08 ENCOUNTER — Other Ambulatory Visit: Payer: Self-pay | Admitting: Nurse Practitioner

## 2022-06-08 DIAGNOSIS — E119 Type 2 diabetes mellitus without complications: Secondary | ICD-10-CM

## 2022-06-08 NOTE — Telephone Encounter (Signed)
Recent COVID positive test.   She is taking ibuprofen.Patient would like to know if she is able to take anything else for her sx: chills, fever (103.5), body aches and weakness.   I was not comfortable with the blanket statement to treat sx due to complex medical history.   Please advise

## 2022-06-09 ENCOUNTER — Telehealth: Payer: Self-pay | Admitting: Nurse Practitioner

## 2022-06-09 ENCOUNTER — Other Ambulatory Visit: Payer: Self-pay | Admitting: Nurse Practitioner

## 2022-06-09 DIAGNOSIS — U071 COVID-19: Secondary | ICD-10-CM

## 2022-06-09 MED ORDER — MOLNUPIRAVIR EUA 200MG CAPSULE: 4.0000 | ORAL_CAPSULE | Freq: Two times a day (BID) | ORAL | 0 refills | Status: AC

## 2022-06-09 NOTE — Telephone Encounter (Signed)
Called to speak with patient about COVID symptoms. No answer. Left voicemail for return call. Thanks.

## 2022-06-09 NOTE — Telephone Encounter (Signed)
Molnupiravir sent to pharmacy. She can alternate tylenol and motrin.

## 2022-06-09 NOTE — Telephone Encounter (Signed)
I tried to contact her with no answer. Need to know what day symptoms started and if she wants COVID treatment.

## 2022-06-09 NOTE — Telephone Encounter (Signed)
Pt LVM on nurse line reporting + covid test on 7/26. Symptoms started 7/26 - fever, flu-like symptoms. Currently taking Ibuprofen and wants to know if she should take any other meds.  Pt uses [facility] on [facility] [phone] or [phone]  Please advise

## 2022-06-09 NOTE — Progress Notes (Signed)
You are being prescribed MOLNUPIRAVIR for COVID-19 infection.  ° ° °Please call the pharmacy or go through the drive through vs going inside if you are picking up the mediation yourself to prevent further spread. If prescribed to a [facility] affiliated pharmacy, a pharmacist will bring the medication out to your car. ° ° °ADMINISTRATION INSTRUCTIONS: °Take with or without food. Swallow the tablets whole. Don't chew, crush, or break the medications because it might not work as well ° °For each dose of the medication, you should be taking FOUR tablets at one time, TWICE a day  ° °Finish your full five-day course of Molnupiravir even if you feel better before you're done. Stopping this medication too early can make it less effective to prevent severe illness related to COVID19.   ° °Molnupiravir is prescribed for YOU ONLY. Don't share it with others, even if they have similar symptoms as you. This medication might not be right for everyone.  ° °Make sure to take steps to protect yourself and others while you're taking this medication in order to get well soon and to prevent others from getting sick with COVID-19. ° ° °**If you are of childbearing potential (any gender) - it is advised to not get pregnant while taking this medication and recommended that condoms are used for female partners the next 3 months after taking the medication out of extreme caution  ° ° °COMMON SIDE EFFECTS: °Diarrhea °Nausea  °Dizziness ° ° ° °If your COVID-19 symptoms get worse, get medical help right away. Call 911 if you experience symptoms such as worsening cough, trouble breathing, chest pain that doesn't go away, confusion, a hard time staying awake, and pale or blue-colored skin. °This medication won't prevent all COVID-19 cases from getting worse.  ° ° °

## 2022-06-15 NOTE — Procedures (Addendum)
Patient's Name: Sharon Chan MRN: [id number] Date of Birth: 09-Feb-1976   Ordering Provider: Patrcia Dolly, M.D. DX CODE(s): R40.4, R56.9 EXAM DURATION: 47 hours   CLINICAL HISTORY: This is a 46 year old woman with spells of loss of consciousness with full body shaking, staring and unresponsiveness, left arm shaking. EEG for classification.   MEDICATION(s): Topamax, Cozaar, Lasix, Wellbutrin, Lipitor, Glucophage, Flexeril, Victoza   TECHNICAL DESCRIPTION: Long-Term EEG with Video was monitored intermittently by a qualified EEG technologist for the entirety of the recording; quality check-ins were performed at a minimum of every two hours, checking, and documenting real-time data and video to assure the integrity and quality of the recording (e.g., camera position, electrode integrity and impedance), and identify the need for maintenance. For intermittent monitoring, an EEG Technologist monitored no more than 12 patients concurrently. Video was being recorded at least 80% of the time during the study duration, unless otherwise noted in an Exception Statement.   At the end of the recording, the EEG Technologist generates a technical description, which is the EEG Technologist's written documentation of the reviewed video-EEG data, including technical interventions and these elements: reviewing raw EEG/VEEG data and events and automated detection as well as patient pushbutton event activations; and annotating, editing, and archiving EEG/VEEG data for review by the physician or other qualified healthcare professional. For review, the Video EEG recording can be visualized in all standard types of montages, 16 channels and greater, and playbacks include digital high frequency filters previously noted. The Video EEG has been notated with patient typical symptom events at the direction of the patient by depressing a push button mounted on a waist worn [employer] EEG recording device. Digital spike and seizure  detection software was used to identify potential abnormalities in the EEG, and alerts were reviewed and annotated by the technologist in the [employer] EEG Review software. Video EEG and report are notated with events that were determined to be of significance by the digital analysis software showing spike and seizure detections.   SET-UP TECH: Eleanora Neighbor   RECORDING SET-UP DATE: 05/25/2022 3:01PM RECORDING TAKE-DOWN DATE: 05/27/2022 2:18PM     SPIKE AND SEIZURE ANALYSIS AND REVIEW: Spike and seizure detection software alerts have been reviewed by a Facilities manager.   PUSH BUTTON EVENTS: A push button or notation was made 4 times. Patient log was reviewed with the patient at disconnect with the intent to reconcile events. 1 button press was test, accidental, or monitor tech driven.   DESCRIPTION OF RECORDING: During maximal wakefulness, the background activity consisted of a symmetric 9-10 Hz posterior dominant rhythm that was reactive to eye opening and eye closure. The background is symmetric. There were no epileptiform discharges or electrographic seizures seen in wakefulness.   During the recording, the patient progresses through wakefulness, drowsiness, and sleep. Vertex waves and sleep spindles were seen. Again there were no epileptiform discharges seen.   EKG lead was normal.    PUSH BUTTON EVENTS: On 7/14 at 1228 hours, she reported numbness, absence. No clinical changes seen on video. Electrographically, there were no EEG or EKG changes seen.  On 7/14 at 1246 hours, alert pressed, no entry in diary log. She is sitting on the sofa, no clinical changes seen. Electrographically, there were no EEG or EKG changes seen.  On 7/14 at 1329 hours, alert pressed 2 times, entered in diary log as twitching arm, aura. Patient looks briefly at left hand and touches it, then documents symptoms. Electrographically, there were no EEG or EKG  changes seen.    IMPRESSION: This 47-hour  ambulatory video EEG is normal.    CLINICAL CORRELATION: A normal EEG does not exclude a clinical diagnosis of epilepsy. Episode of numbness, absence, arm twitching/aura did not show EEG correlate. Simple partial seizures may have surface negative EEG, however in setting of normal baseline EEG, nonepileptic events are also possible. If further clinical questions remain, inpatient video EEG monitoring may be helpful.   Patrcia Dolly, M.D.

## 2022-06-21 ENCOUNTER — Institutional Professional Consult (permissible substitution): Payer: Medicare Other | Admitting: Clinical

## 2022-06-22 ENCOUNTER — Encounter (INDEPENDENT_AMBULATORY_CARE_PROVIDER_SITE_OTHER): Payer: Self-pay

## 2022-06-23 ENCOUNTER — Encounter: Payer: Self-pay | Admitting: Neurology

## 2022-06-23 ENCOUNTER — Ambulatory Visit (INDEPENDENT_AMBULATORY_CARE_PROVIDER_SITE_OTHER): Payer: Medicare Other | Admitting: Neurology

## 2022-06-23 VITALS — BP 142/97 | HR 79 | Ht 63.0 in | Wt 216.0 lb

## 2022-06-23 DIAGNOSIS — R569 Unspecified convulsions: Secondary | ICD-10-CM | POA: Diagnosis not present

## 2022-06-23 DIAGNOSIS — R519 Headache, unspecified: Secondary | ICD-10-CM

## 2022-06-23 DIAGNOSIS — Z8673 Personal history of transient ischemic attack (TIA), and cerebral infarction without residual deficits: Secondary | ICD-10-CM | POA: Diagnosis not present

## 2022-06-23 DIAGNOSIS — R404 Transient alteration of awareness: Secondary | ICD-10-CM

## 2022-06-23 MED ORDER — NORTRIPTYLINE HCL 25 MG PO CAPS: ORAL_CAPSULE | ORAL | 6 refills | Status: DC

## 2022-06-23 MED ORDER — DIVALPROEX SODIUM ER 500 MG PO TB24: ORAL_TABLET | ORAL | 11 refills | Status: DC

## 2022-06-23 NOTE — Patient Instructions (Addendum)
Good to see you.  Start Depakote ER 500mg  every night to help cut down on seizures and headaches  2. Start daily aspirin 81mg  tablet  3. Continue nortriptyline 25mg : Take 2 capsules every night  4. Continue Topiramate 100mg  in AM, 200mg  in PM  5. Schedule 30-day holter monitor  6. Schedule echocardiogram with bubble study  7. Schedule inpatient video EEG monitoring  8. Follow-up in 3 months, call for any changes
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
## 2022-06-24 ENCOUNTER — Telehealth: Payer: Self-pay | Admitting: Neurology

## 2022-06-24 NOTE — Telephone Encounter (Signed)
Called back and told her ok.

## 2022-06-24 NOTE — Telephone Encounter (Signed)
Sharon Chan from [facility] called and said she received a 17 page fax on this patient but she thinks it came to wrong facility. Please call her back to confirm.

## 2022-06-27 ENCOUNTER — Other Ambulatory Visit: Payer: Self-pay

## 2022-06-27 ENCOUNTER — Institutional Professional Consult (permissible substitution): Payer: Medicare Other | Admitting: Clinical

## 2022-06-27 ENCOUNTER — Telehealth: Payer: Self-pay

## 2022-06-27 DIAGNOSIS — R569 Unspecified convulsions: Secondary | ICD-10-CM

## 2022-06-27 NOTE — Telephone Encounter (Signed)
Called Patients insurance [place] not needed. Reference # [id number]

## 2022-06-28 ENCOUNTER — Ambulatory Visit (INDEPENDENT_AMBULATORY_CARE_PROVIDER_SITE_OTHER): Payer: Medicare Other | Admitting: Nurse Practitioner

## 2022-06-28 ENCOUNTER — Encounter: Payer: Self-pay | Admitting: Nurse Practitioner

## 2022-06-28 VITALS — BP 156/98 | HR 73 | Temp 98.7°F | Ht 63.0 in | Wt 220.6 lb

## 2022-06-28 DIAGNOSIS — G4719 Other hypersomnia: Secondary | ICD-10-CM | POA: Insufficient documentation

## 2022-06-28 DIAGNOSIS — E6609 Other obesity due to excess calories: Secondary | ICD-10-CM

## 2022-06-28 DIAGNOSIS — Z6839 Body mass index (BMI) 39.0-39.9, adult: Secondary | ICD-10-CM

## 2022-06-28 DIAGNOSIS — G4700 Insomnia, unspecified: Secondary | ICD-10-CM

## 2022-06-28 NOTE — Assessment & Plan Note (Addendum)
Healthy weight management discussed. Reviewed the correlation between obesity and OSA. Walks for exercise. Has been struggling with weight loss since her stroke.

## 2022-06-28 NOTE — Progress Notes (Signed)
_0  ID: Sharon Chan, female    DOB: 1976-04-17, 46 y.o.   MRN: [id number]  Chief Complaint  Patient presents with   Consult    Referring provider: Fenton Foy, NP  HPI: 46 year old female, never smoker referred for sleep consult. Past medical history significant for HTN, hx of stroke with right sided hemiparesis, DM, polycystic kidney disease, HLD, sickle cell trait, insomnia, anxiety, obesity.   TEST/EVENTS:   06/28/2022: Today - sleep consult Patient presents today for sleep consult referred by Lazaro Arms, NP.  She was also encouraged to be evaluated by her neurologist.  She suffered from a stroke in 2018.  Since then, she has had trouble with chronic headaches and difficulty sleeping at night.  Wakes almost every hour.  She also has to use the bathroom frequently at night; has always attributed this to her polycystic kidney disease.  She also has excessive daytime fatigue symptoms; doesn't necessarily feel as though she could fall asleep but always feels run down with little energy.  States that after 6 PM, she is done for and is not able to do anything else.  Can fall asleep just being on the computer.  She has also had some trouble with her blood pressure recently.  Has not been told that she snores or stops breathing at night.  She denies any sleep parasomnia/paralysis, narcolepsy or cataplexy. She does not drive.  She goes to bed between 10 to 11 PM.  Can take anywhere from 30 minutes to a few hours for her to fall asleep.  Some mornings, she gets up around 3 AM to go for a walk for exercise.  She will then come back home and lay back down and go to sleep for a few more hours and then officially get out of the bed at 6 AM.  When she does not do this, she gets up at 6 AM.  She takes Ambien some nights to help her fall asleep, which she has been on since 2019.  Seems to help with sleep latency but not sleep maintenance.  She has been on disability since her stroke in 2018.   Her weight is up about 20 to 30 pounds.  She has never had a formal sleep study before.  She is on multiple sedating medicines including Flexeril, Depakote and Topamax.  Her neurologist is concerned that she has had another recent cerebral infarction based on her imaging.  They are ordering a heart monitor to rule out underlying arrhythmia.  Epworth 2  Allergies  Allergen Reactions   Penicillins Anaphylaxis    Immunization History  Administered Date(s) Administered   Influenza,inj,Quad PF,6+ Mos 09/12/2017, 10/01/2018, 11/20/2019   PFIZER(Purple Top)SARS-COV-2 Vaccination 02/06/2020, 02/25/2020    Past Medical History:  Diagnosis Date   Anxiety    CVA (cerebral vascular accident) ([place])    Depression    DM type 2 (diabetes mellitus, type 2) ([place])    HLD (hyperlipidemia)    HTN (hypertension)    Joint pain    Migraines    Neck pain    Obese    Polycystic disease, ovaries    Right sided weakness    Seizures ([facility])    Shoulder pain    Sickle cell trait ([facility])    Thyroid nodule    Vitamin D deficiency 01/2020    Tobacco History: Social History   Tobacco Use  Smoking Status Never  Smokeless Tobacco Never   Counseling given: Not Answered   Outpatient Medications  Prior to Visit  Medication Sig Dispense Refill   acetaminophen (TYLENOL) 325 MG tablet Take 1-2 tablets (325-650 mg total) by mouth every 4 (four) hours as needed for mild pain.     albuterol (PROVENTIL HFA;VENTOLIN HFA) 108 (90 Base) MCG/ACT inhaler Inhale 2 puffs into the lungs every 6 (six) hours as needed for wheezing or shortness of breath. 1 Inhaler 11   atorvastatin (LIPITOR) 40 MG tablet Take 1 tablet (40 mg total) by mouth daily at 6 PM. 90 tablet 2   blood glucose meter kit and supplies KIT Dispense based on patient and insurance preference. Use up to four times daily as directed. (FOR ICD-9 250.00, 250.01). 1 each 0   buPROPion (WELLBUTRIN SR) 200 MG 12 hr tablet TAKE 1 TABLET BY MOUTH TWICE A DAY 180  tablet 3   cyclobenzaprine (FLEXERIL) 5 MG tablet Take 1 tablet (5 mg total) by mouth 3 (three) times daily as needed for muscle spasms. 30 tablet 1   diphenhydrAMINE (BENADRYL) 25 MG tablet Take 25 mg by mouth every 6 (six) hours as needed.     divalproex (DEPAKOTE ER) 500 MG 24 hr tablet Take 1 tablet every night 30 tablet 11   furosemide (LASIX) 20 MG tablet TAKE 1 TABLET BY MOUTH EVERY DAY 90 tablet 3   glucose blood (ONE TOUCH ULTRA TEST) test strip TEST TWICE A DAY 100 each 0   Insulin Pen Needle (BD PEN NEEDLE NANO U/F) 32G X 4 MM MISC Inject 1 Units into the skin 2 (two) times daily. 100 each 12   liraglutide (VICTOZA) 18 MG/3ML SOPN Inject 1.2 mg into the skin every morning. 3 mL 3   losartan (COZAAR) 50 MG tablet Take 1 tablet (50 mg total) by mouth in the morning and at bedtime. 180 tablet 1   metFORMIN (GLUCOPHAGE) 500 MG tablet Take 1 tablet (500 mg total) by mouth 2 (two) times daily with a meal. 180 tablet 3   nortriptyline (PAMELOR) 25 MG capsule Take 2 capsules every night 180 capsule 6   OneTouch Delica Lancets 56Y MISC TES 2 (TWO) TIMES DAILY. 100 each 5   sulfamethoxazole-trimethoprim (BACTRIM DS) 800-160 MG tablet Take 1 tablet by mouth 2 (two) times daily. 14 tablet 0   topiramate (TOPAMAX) 100 MG tablet TAKE 1 TABLET IN MORNING AND 2 TABLETS AT NIGHT 90 tablet 6   Vitamin D, Ergocalciferol, (DRISDOL) 1.25 MG (50000 UNIT) CAPS capsule Take 1 capsule (50,000 Units total) by mouth every 7 (seven) days. 4 capsule 6   zolpidem (AMBIEN) 5 MG tablet TAKE 1 TABLET BY MOUTH EVERY DAY AT BEDTIME AS NEEDED FOR SLEEP 30 tablet 0   hydrochlorothiazide (HYDRODIURIL) 12.5 MG tablet Take 1 tablet (12.5 mg total) by mouth daily. 30 tablet 0   Facility-Administered Medications Prior to Visit  Medication Dose Route Frequency Provider Last Rate Last Admin   cloNIDine (CATAPRES) tablet 0.1 mg  0.1 mg Oral Once Fenton Foy, NP         Review of Systems:   Constitutional: +excessive  daytime fatigue, weight gain (20-30 lb). No night sweats, fevers, chills, or lassitude. HEENT: +chronic headaches. No difficulty swallowing, tooth/dental problems, or sore throat.  CV:  No chest pain, orthopnea, PND, swelling in lower extremities, anasarca, dizziness, palpitations, syncope Resp: No shortness of breath with exertion or at rest. No excess mucus or change in color of mucus. No productive or non-productive. No hemoptysis. No wheezing.  No chest wall deformity GU: +nocturia. No dysuria, change  in color of urine, urgency or frequency.  No flank pain, no hematuria  MSK:  No joint pain or swelling.  No decreased range of motion.  No back pain. Neuro: No dizziness or lightheadedness. +right sided weakness (baseline) Psych: No depression or anxiety. Mood stable.     Physical Exam:  BP (!) 156/98   Pulse 73   Temp 98.7 F (37.1 C) (Oral)   Ht _0  (1.6 m)   Wt 220 lb 9.6 oz (100.1 kg)   SpO2 98%   BMI 39.08 kg/m   GEN: Pleasant, interactive, well-appearing; obese; in no acute distress. HEENT:  Normocephalic and atraumatic. PERRLA. Sclera white. Nasal turbinates pink, moist and patent bilaterally. No rhinorrhea present. Oropharynx pink and moist, without exudate or edema. No lesions, ulcerations, or postnasal drip. Mallampati III NECK:  Supple w/ fair ROM.  CV: RRR, no m/r/g, no peripheral edema. Pulses intact, +2 bilaterally. No cyanosis, pallor or clubbing. PULMONARY:  Unlabored, regular breathing. Clear bilaterally A&P w/o wheezes/rales/rhonchi. No accessory muscle use. No dullness to percussion. GI: BS present and normoactive. Soft, non-tender to palpation. MSK: No erythema, warmth or tenderness. Neuro: A/Ox3.  Skin: Warm, no lesions or rashe Psych: Normal affect and behavior. Judgement and thought content appropriate.     Lab Results:  CBC    Component Value Date/Time   WBC 6.2 06/02/2022 1017   WBC 5.3 09/12/2017 1121   RBC 5.37 (H) 06/02/2022 1017   RBC 4.90  09/12/2017 1121   HGB 12.8 06/02/2022 1017   HCT 39.9 06/02/2022 1017   PLT 326 06/02/2022 1017   MCV 74 (L) 06/02/2022 1017   MCH 23.8 (L) 06/02/2022 1017   MCH 25.1 (L) 09/12/2017 1121   MCHC 32.1 06/02/2022 1017   MCHC 32.6 09/12/2017 1121   RDW 18.6 (H) 06/02/2022 1017   LYMPHSABS 2.7 02/03/2020 1412   MONOABS 396 03/17/2017 1513   EOSABS 0.2 02/03/2020 1412   BASOSABS 0.1 02/03/2020 1412    BMET    Component Value Date/Time   NA 142 06/02/2022 1017   K 4.4 06/02/2022 1017   CL 106 06/02/2022 1017   CO2 23 06/02/2022 1017   GLUCOSE 96 06/02/2022 1017   GLUCOSE 101 (H) 09/12/2017 1121   BUN 18 06/02/2022 1017   CREATININE 1.03 (H) 06/02/2022 1017   CREATININE 0.92 09/12/2017 1121   CALCIUM 9.4 06/02/2022 1017   GFRNONAA 72 02/03/2020 1412   GFRNONAA 71 03/17/2017 1513   GFRAA 83 02/03/2020 1412   GFRAA 82 03/17/2017 1513    BNP No results found for: "BNP"   Imaging:  No results found.        No data to display          No results found for: "NITRICOXIDE"      Assessment & Plan:   Excessive daytime sleepiness She has excessive daytime sleepiness, frequent night awakenings, nocturia, chronic headaches. BMI is 39. She has a history of stroke and hypertension, which we discussed can be the result of untreated sleep apnea. She also has multiple sedating medications, which increases her risk for central apnea. Given this,  I am concerned she could have sleep disordered breathing with sleep apnea. She will need in lab study for further evaluation.    - discussed how weight can impact sleep and risk for sleep disordered breathing - discussed options to assist with weight loss: combination of diet modification, cardiovascular and strength training exercises   - had an extensive discussion regarding the adverse health  consequences related to untreated sleep disordered breathing - specifically discussed the risks for hypertension, coronary artery disease,  cardiac dysrhythmias, cerebrovascular disease, and diabetes - lifestyle modification discussed   - discussed how sleep disruption can increase risk of accidents, particularly when driving - safe driving practices were discussed  Patient Instructions  I am concerned given your history of strokes, excessive daytime sleepiness, frequent nocturnal awakenings that you may have sleep apnea. Given your history and current medications, recommend an in lab study. Someone will contact you for scheduling.   We discussed how untreated sleep apnea puts an individual at risk for cardiac arrhthymias, pulm HTN, DM, stroke and increases their risk for daytime accidents. We also briefly reviewed treatment options including weight loss, side sleeping position, oral appliance, CPAP therapy or referral to ENT for possible surgical options  Follow up after your sleep study with Dr. Halford Chessman (30 min new patient appt) or Katie Jaylon Boylen,NP. If symptoms do not improve or worsen, please contact office for sooner follow up or seek emergency care.     Obesity Healthy weight management discussed. Reviewed the correlation between obesity and OSA. Walks for exercise. Has been struggling with weight loss since her stroke.   Insomnia She has struggled with insomnia, both sleep onset and maintenance, since her stroke in 2019. Currently on Ambien, which helps reduce sleep latency but she still has frequent night awakenings. This could be related to untreated sleep apnea. See above plan.    I spent 41 minutes of dedicated to the care of this patient on the date of this encounter to include pre-visit review of records, face-to-face time with the patient discussing conditions above, post visit ordering of testing, clinical documentation with the electronic health record, making appropriate referrals as documented, and communicating necessary findings to members of the patients care team.  Clayton Bibles, NP 06/28/2022  Pt aware and  understands NP's role.

## 2022-06-28 NOTE — Assessment & Plan Note (Signed)
Shehas excessivedaytime sleepiness, frequent night awakenings, nocturia, chronic headaches. BMI is 39. She has a history of stroke and hypertension, which we discussed can be the result of untreated sleep apnea.She also has multiple sedating medications, which increases her risk for central apnea. Given this,I am concernedshe could have sleep disordered breathing with sleep apnea. She will need in lab study for further evaluation.  - discussed how weight can impact sleep and risk for sleep disordered breathing - discussed options to assist with weight loss: combination of diet modification, cardiovascular and strength training exercises  - had an extensive discussion regarding the adverse health consequences related to untreated sleep disordered breathing - specifically discussed the risks for hypertension, coronary artery disease, cardiac dysrhythmias, cerebrovascular disease, and diabetes - lifestyle modification discussed  - discussed how sleep disruption can increase risk of accidents, particularly when driving - safe driving practices were discussed  Patient Instructions  I am concerned given your history of strokes, excessive daytime sleepiness, frequent nocturnal awakenings that you may have sleep apnea. Given your history and current medications, recommend an in lab study. Someone will contact you for scheduling.   We discussed how untreated sleep apnea puts an individual at risk for cardiac arrhthymias, pulm HTN, DM, stroke and increases their risk for daytime accidents. We also briefly reviewed treatment options including weight loss, side sleeping position, oral appliance, CPAP therapy or referral to ENT for possible surgical options  Follow up after your sleep study with Dr. Craige Cotta (30 min new patient appt) or Katie Mel Langan,NP. If symptoms do not improve or worsen, please contact office for sooner follow up or seek emergency care.

## 2022-06-28 NOTE — Assessment & Plan Note (Signed)
She has struggled with insomnia, both sleep onset and maintenance, since her stroke in 2019. Currently on Ambien, which helps reduce sleep latency but she still has frequent night awakenings. This could be related to untreated sleep apnea. See above plan.

## 2022-06-28 NOTE — Patient Instructions (Addendum)
I am concerned given your history of strokes, excessive daytime sleepiness, frequent nocturnal awakenings that you may have sleep apnea. Given your history and current medications, recommend an in lab study. Someone will contact you for scheduling.   We discussed how untreated sleep apnea puts an individual at risk for cardiac arrhthymias, pulm HTN, DM, stroke and increases their risk for daytime accidents. We also briefly reviewed treatment options including weight loss, side sleeping position, oral appliance, CPAP therapy or referral to ENT for possible surgical options  Follow up after your sleep study with Sharon Chan (30 min new patient appt) or Sharon Kenidee Cregan,NP. If symptoms do not improve or worsen, please contact office for sooner follow up or seek emergency care.

## 2022-07-04 ENCOUNTER — Ambulatory Visit (INDEPENDENT_AMBULATORY_CARE_PROVIDER_SITE_OTHER): Payer: Medicare Other | Admitting: Clinical

## 2022-07-04 ENCOUNTER — Ambulatory Visit: Payer: Medicare Other | Attending: Neurology

## 2022-07-04 DIAGNOSIS — F3289 Other specified depressive episodes: Secondary | ICD-10-CM

## 2022-07-04 DIAGNOSIS — I639 Cerebral infarction, unspecified: Secondary | ICD-10-CM | POA: Diagnosis not present

## 2022-07-04 DIAGNOSIS — I4891 Unspecified atrial fibrillation: Secondary | ICD-10-CM | POA: Diagnosis not present

## 2022-07-04 DIAGNOSIS — Z8673 Personal history of transient ischemic attack (TIA), and cerebral infarction without residual deficits: Secondary | ICD-10-CM | POA: Diagnosis not present

## 2022-07-04 NOTE — BH Specialist Note (Addendum)
Integrated Behavioral Health Initial In-Person Visit  MRN: [id number] Name: Sharon Chan  Number of Integrated Behavioral Health Clinician visits: 1- Initial Visit  Session Start time: 1305    Session End time: 1405  Total time in minutes: 60   Types of Service: Individual psychotherapy  Interpretor:No. Interpretor Name and Language: none  Subjective: Sharon Chan is a 46 y.o. female accompanied by  self. Patient was referred by Angus Seller, NP for depression. Patient reports the following symptoms/concerns: depression and anxiety related to health concerns Duration of problem: several years; Severity of problem: severe  Objective: Mood: Euthymic and Affect: Appropriate Risk of harm to self or others: No plan to harm self or others  Life Context: Family and Social: lives alone, has family in the area including mom and sister School/Work: has earned several advanced degrees, is working on a certification related to pastoral care Self-Care: prayer, spirituality Life Changes: stroke in 2018 has led to significant changes in cognition and physical abilities  Patient and/or Family's Strengths/Protective Factors: Social connections, Social and Patent attorney, [place]crete supports in place (healthy food, safe environments, etc.), and Sense of purpose  Goals Addressed: Patient will: Reduce symptoms of: anxiety and depression Increase knowledge and/or ability of: coping skills and self-management skills  Demonstrate ability to: Increase healthy adjustment to current life circumstances  Progress towards Goals: Ongoing  Interventions: Interventions utilized: Supportive Counseling  Standardized Assessments completed: Not Needed  Assessment and supportive counseling today. Patient experienced a stroke about 5 or 6 years ago and this has led to changes in her mental and physical abilities. Patient experiences anxiety about leaving home due to fear of another  stroke. Patient had some negative experiences with medical providers when she had her stroke. Emotional validation and reflective listening provided today.  Patient and/or Family Response: Patient engaged in session.   Patient Centered Plan: Patient is on the following Treatment Plan(s): CBT for depression and anxiety.  Assessment: Patient currently experiencing depression and anxiety related to health conditions.   Patient may benefit from CBT to explore unhelpful beliefs about self and re-frame these beliefs to improve mood and decrease anxiety.  Plan: Follow up with behavioral health clinician on: 08/02/22 Referral(s): Integrated [employer] (In Clinic)  Abigail Butts, LCSW [facility] [phone]

## 2022-07-06 ENCOUNTER — Telehealth: Payer: Self-pay | Admitting: Pharmacist

## 2022-07-06 NOTE — Telephone Encounter (Signed)
Received referral from PCP for management of hypertension. Contacted patient, scheduled phone appointment tomorrow.   Catie Eppie Gibson, PharmD, [facility] [phone]

## 2022-07-07 ENCOUNTER — Other Ambulatory Visit: Payer: Self-pay | Admitting: Pharmacist

## 2022-07-07 DIAGNOSIS — E781 Pure hyperglyceridemia: Secondary | ICD-10-CM

## 2022-07-07 DIAGNOSIS — I10 Essential (primary) hypertension: Secondary | ICD-10-CM

## 2022-07-07 MED ORDER — ATORVASTATIN CALCIUM 40 MG PO TABS: 40.0000 mg | ORAL_TABLET | Freq: Every day | ORAL | 3 refills | Status: DC

## 2022-07-07 MED ORDER — VALSARTAN-HYDROCHLOROTHIAZIDE 320-25 MG PO TABS: 1.0000 | ORAL_TABLET | Freq: Every day | ORAL | 3 refills | Status: DC

## 2022-07-07 MED ORDER — VALSARTAN-HYDROCHLOROTHIAZIDE 320-12.5 MG PO TABS: 1.0000 | ORAL_TABLET | Freq: Every day | ORAL | 3 refills | Status: DC

## 2022-07-07 NOTE — Chronic Care Management (AMB) (Signed)
Chief Complaint  Patient presents with   Hypertension    Sharon Chan is a 46 y.o. year old female who presented for a telephone visit.   They were referred to the pharmacist by their PCP for assistance in managing hypertension.   Patient is participating in a Managed Medicaid Plan:  Yes  Subjective:  Care Team: Primary Care Provider: Fenton Foy, NP ; Next Scheduled Visit: 09/02/22 Neurologist: Delice Lesch; Next Scheduled Visit: 09/27/22  Medication Access/Adherence  Current Pharmacy:  [id number] [place] [place]Phone: [phone]: [phone] [facility] [place] [address]Phone: [phone]: [phone]  Patient reports affordability concerns with their medications: No  Patient reports access/transportation concerns to their pharmacy: No  Patient reports adherence concerns with their medications:  No     Hypertension:  Current medications: losartan 50 mg twice daily Medications previously tried: HCTZ - previously prescribed for a 30 day supply;   Patient does not have a validated, automated, upper arm home BP cuff Current blood pressure readings readings: mother is a RTherapist, sportsand will sometimes check; reports readings 1280-034systolic lately. Confirms a headache, but discussed with neurology recently.   Patient denies hypotensive s/sx including dizziness, lightheadedness.  Patient reports hypertensive symptoms including headache  Current meal patterns: breakfast: 2 eggs, piece of toast; or yogurt; or premier protein shake; lunch; sandwich; tKuwaitor roast beef; 1/2 cup of berries, pear, apple; supper: 6 oz lean protein, vegetables; twice weekly will have a soda  Current physical activity: walks 10 miles every morning starting at 3 am.    Hyperlipidemia/ASCVD Risk  Reduction  Current lipid lowering medications: atorvastatin 40 mg daily    Antiplatelet regimen: aspirin 81 mg   Patient thought she couldn't take atorvastatin if it wasn't close to 6 pm, so she is often missing doses.  Health Maintenance  Health Maintenance Due  Topic Date Due   OPHTHALMOLOGY EXAM  Never done   Hepatitis C Screening  Never done   PAP SMEAR-Modifier  Never done   COLONOSCOPY (Pts 45-41yrInsurance coverage will need to be confirmed)  Never done   FOOT EXAM  01/02/2020   COVID-19 Vaccine (3 - Pfizer series) 04/21/2020   INFLUENZA VACCINE  06/14/2022     Objective: Lab Results  Component Value Date   HGBA1C 6.3 (A) 06/02/2022   HGBA1C 6.3 06/02/2022   HGBA1C 6.3 06/02/2022   HGBA1C 6.3 06/02/2022    Lab Results  Component Value Date   CREATININE 1.03 (H) 06/02/2022   BUN 18 06/02/2022   NA 142 06/02/2022   K 4.4 06/02/2022   CL 106 06/02/2022   CO2 23 06/02/2022    Lab Results  Component Value Date   CHOL 199 03/02/2022   HDL 49 03/02/2022   LDLCALC 138 (H) 03/02/2022   TRIG 66 03/02/2022   CHOLHDL 4.1 03/02/2022    Medications Reviewed Today     Reviewed by HaOsker MasonRPH-CPP (Pharmacist) on 07/07/22 at 1413  Med List Status: <None>   Medication Order Taking? Sig Documenting Provider Last Dose Status Informant  acetaminophen (TYLENOL) 325 MG tablet [id number] 1-2 tablets (325-650 mg total) by mouth every 4 (four) hours as needed for mild pain. AqCameron SprangMD  Active   albuterol (PROVENTIL HFA;VENTOLIN HFA) 108 ([phone])  MCG/ACT inhaler [id number]  Inhale 2 puffs into the lungs every 6 (six) hours as needed for wheezing or shortness of breath. Azzie Glatter, FNP  Active            Med Note Jodi Mourning, Elener Custodio T   Thu Jul 07, 2022  2:10 PM)    aspirin EC 81 MG tablet [id number] Yes Take 81 mg by mouth daily. Swallow whole. [provider] Taking Active   atorvastatin (LIPITOR) 40 MG tablet [id number] Yes Take 1  tablet (40 mg total) by mouth daily at 6 PM. Fenton Foy, NP Taking Active   blood glucose meter kit and supplies KIT [id number] Yes Dispense based on patient and insurance preference. Use up to four times daily as directed. (FOR ICD-9 250.00, 250.01). Dennard Nip D, MD Taking Active   cloNIDine (CATAPRES) tablet 0.1 mg [id number]   Fenton Foy, NP  Consider Medication Status and Discontinue   cyclobenzaprine (FLEXERIL) 5 MG tablet [id number] Yes Take 1 tablet (5 mg total) by mouth 3 (three) times daily as needed for muscle spasms. Fenton Foy, NP Taking Active   diphenhydrAMINE (BENADRYL) 25 MG tablet [id number] Yes Take 25 mg by mouth every 6 (six) hours as needed. [provider] Taking Active            Med Note Azzie Glatter   Fri Oct 18, 2019  3:35 PM) As needed.   divalproex (DEPAKOTE ER) 500 MG 24 hr tablet [id number] Yes Take 1 tablet every night Cameron Sprang, MD Taking Active   glucose blood (ONE TOUCH ULTRA TEST) test strip [id number] Yes TEST TWICE A DAY Azzie Glatter, FNP Taking Active   hydrochlorothiazide (HYDRODIURIL) 12.5 MG tablet [id number] Yes Take 1 tablet (12.5 mg total) by mouth daily. Fenton Foy, NP Taking Active   Insulin Pen Needle (BD PEN NEEDLE NANO U/F) 32G X 4 MM MISC [id number] Yes Inject 1 Units into the skin 2 (two) times daily. Azzie Glatter, FNP Taking Active   liraglutide (VICTOZA) 18 MG/3ML SOPN [id number] Yes Inject 1.2 mg into the skin every morning. Fenton Foy, NP Taking Active            Med Note Jodi Mourning, Sabrinna Yearwood T   Thu Jul 07, 2022  2:13 PM) 0.6 mg daily  losartan (COZAAR) 50 MG tablet [id number] Yes Take 1 tablet (50 mg total) by mouth in the morning and at bedtime. Fenton Foy, NP Taking Active            Med Note Jodi Mourning, Tillie Fantasia Jul 07, 2022  2:12 PM)    nortriptyline [facility]) 25 MG capsule [id number] Yes Take 2 capsules every night Cameron Sprang, MD Taking Active            Med Note  Jodi Mourning, Tillie Fantasia Jul 07, 2022  2:08 PM) Taking 1 QPM  OneTouch Delica Lancets 66Q [place] [id number] Yes TES 2 (TWO) TIMES DAILY. Azzie Glatter, FNP Taking Active   topiramate (TOPAMAX) 100 MG tablet [id number] Yes TAKE 1 TABLET IN MORNING AND 2 TABLETS AT [place] Pho, MD Taking Active   Vitamin D, Ergocalciferol, (DRISDOL) 1.25 MG (50000 UNIT) CAPS capsule [id number] No Take 1 capsule (50,000 Units total) by mouth every 7 (seven) days.  Patient not taking: Reported on 07/07/2022   Azzie Glatter, FNP Not Taking Active   zolpidem (AMBIEN) 5 MG tablet [id number] Yes TAKE 1 TABLET BY MOUTH  EVERY DAY AT BEDTIME AS NEEDED FOR SLEEP Fenton Foy, NP Taking Active               Assessment/Plan:   Hypertension: - Currently uncontrolled - Reviewed long term cardiovascular and renal outcomes of uncontrolled blood pressure - Reviewed appropriate blood pressure monitoring technique and reviewed goal blood pressure. Recommended to check home blood pressure and heart rate daily. Discussed Over the Counter benefits that may allow her to order a BP machine - Recommend to stop losartan, start valsartan 320 mg daily for improved efficacy. Added HCTZ order in combination pill. Patient will take every morning. Discussed with PCP via [place], she is in agreement. Order placed and sent to pharmacy.   Hyperlipidemia/ASCVD Risk Reduction: - Currently uncontrolled.  - Reviewed that given half life of atorvastatin, this can be taken at any time of the day, morning or evening. Patient was extremely thankful to find this out. Order updated per PCP agreement.    Follow Up Plan: phone call follow up in 2 weeks  [name], PharmD, [place] [phone]

## 2022-07-07 NOTE — Patient Instructions (Addendum)
Royetta,   It was great talking to you today!  Stop losartan, start valsartan/hydrochlorothiazide 320/12.5 mg daily. I recommend you take this in the morning, but AFTER your walk!  You do NOT have to take atorvastatin at 6 pm - you can take it either in the morning or the evening, whichever is best for your routine, with your other medications.   Check with your insurance about "Over the Counter Benefits" for an upper arm BP machine. If they do not have one, I'd recommend you consider purchasing an upper arm Omron brand cuff.   Check your blood pressure once daily, and any time you have concerning symptoms like headache, chest pain, dizziness, shortness of breath, or vision changes.   Our goal is less than 130/80.  To appropriately check your blood pressure, make sure you do the following:  1) Avoid caffeine, exercise, or tobacco products for 30 minutes before checking. Empty your bladder. 2) Sit with your back supported in a flat-backed chair. Rest your arm on something flat (arm of the chair, table, etc). 3) Sit still with your feet flat on the floor, resting, for at least 5 minutes.  4) Check your blood pressure. Take 1-2 readings.  5) Write down these readings and bring with you to any provider appointments.  Bring your home blood pressure machine with you to a provider's office for accuracy comparison at least once a year.   Make sure you take your blood pressure medications before you come to any office visit, even if you were asked to fast for labs.   Take care!  Catie Eppie Gibson, PharmD, [facility] [phone]

## 2022-07-10 NOTE — Progress Notes (Signed)
Reviewed and agree with assessment/plan.   Shontez Sermon, MD [facility] 07/10/2022, 4:13 PM Pager:  [phone]  

## 2022-07-21 ENCOUNTER — Other Ambulatory Visit: Payer: Medicare Other | Admitting: Pharmacist

## 2022-07-21 ENCOUNTER — Ambulatory Visit: Payer: Medicare Other

## 2022-07-21 ENCOUNTER — Telehealth: Payer: Self-pay | Admitting: *Deleted

## 2022-07-21 ENCOUNTER — Other Ambulatory Visit: Payer: Self-pay | Admitting: Nurse Practitioner

## 2022-07-21 DIAGNOSIS — I10 Essential (primary) hypertension: Secondary | ICD-10-CM

## 2022-07-21 NOTE — Progress Notes (Signed)
Care Coordination Call  Contacted patient for scheduled phone call appointment for HTN. She reported the episode of syncope that she had earlier today, she reported this to [facility] Care RN when they called because she is wearing an event monitor.   Patient reports she had a great time at the beach this weekend with her mother, she generally felt more relaxed than she normally does. She did have one day where her blood pressure was significantly elevated (reports systolic in 200s) so she went to the [facility], reports she was told to double her dose of valsartan/HCTZ that day. Reports pressure lowered. She did gentle pool exercise and relaxation and her BP continued to improve that day. She reports she slept well that night.   She notes that her blood pressure yesterday was 112/78. Denies any other syncopal episodes or symptoms since we changed losartan to valsartan/HCTZ last week.   Today though, she feel that her blood pressure is elevated. She was instructed by [facility] Care RN to seek emergency evaluation given persistent weakness and tingling in her L hand. She plans to go to the ED this afternoon, her mother is going to take her.   Will follow.   Catie Eppie Gibson, PharmD, [facility] [phone]

## 2022-07-21 NOTE — Telephone Encounter (Addendum)
   Cardiac Monitor Alert  Date of alert:  07/21/2022   Patient Name: Sharon Chan  DOB: 11-22-1975  MRN: [id number]   [facility] Cardiologist: None  [facility] EP:  None    Monitor Information: Cardiac Event Monitor [[employer]]  Reason:  Stroke work up  Ordering provider: Dr. Karel Jarvis (Neurololgy)    Alert Syncope    9/7 7:28 am (NSR)  This is the 1st alert for this rhythm.   Next Cardiology Appointment   Not a patient at our office, just monitoring the heart monitor.   The patient had be awake for 2-3 hours. She had breakfast. Went into the bathroom to get in the shower. She started having dizziness then symptoms progressed to "little" shortness of breath and chest pressure. Symptoms progression less than 5 minutes before passing out and falling. Educated the patient about sitting when symptoms arise. Patient's mother was there, out for 7 minutes. Only symptoms now is "funny feeling" in chest and left arm tingling. Of note stroke effected right side. Advise patient to call Dr. Karel Jarvis and let her know that she passed out. Will forward this message to there office as well.   Arrhythmia, symptoms and history reviewed with DOD Katrinka Blazing. Plan:  Recommended ER for CP evaluation.   Other: Patient made aware and verbalized agreement and understanding.   Sampson Goon, RN  07/21/2022 10:29 AM

## 2022-07-26 ENCOUNTER — Other Ambulatory Visit: Payer: Self-pay | Admitting: Pharmacist

## 2022-07-26 NOTE — Progress Notes (Signed)
Care Coordination Call  Called patient to follow up. She did not go to the ED for evaluation as instructed last week. She reports she has been "trying to take it easy" and staying in bed for the past few days. However, she reports her blood pressure yesterday was 180 systolic. She has not taken medications today.   She does report continued headache, though reports this has been constant since her last stroke. Endorses continued chest "pressure". I again advised ED evaluation. I encouraged patient to get up and take her medications, and check blood pressure in about an hour, and I will call back.   Catie Eppie Gibson, PharmD, [facility] [phone]

## 2022-07-26 NOTE — Progress Notes (Signed)
Care Coordination Call  Called patient back. She did not check her blood pressure. However, she reports that she did get out of bed, took her meds and ate a little bit, and she has been feeling better.   Encouraged to check blood pressure and blood sugar tonight, tomorrow morning, and I will call tomorrow afternoon for follow up.   Catie Eppie Gibson, PharmD, [facility] [phone]

## 2022-07-27 ENCOUNTER — Ambulatory Visit (HOSPITAL_BASED_OUTPATIENT_CLINIC_OR_DEPARTMENT_OTHER): Payer: Medicare Other | Attending: Nurse Practitioner | Admitting: Pulmonary Disease

## 2022-07-27 ENCOUNTER — Other Ambulatory Visit: Payer: Medicare Other | Admitting: Pharmacist

## 2022-07-27 DIAGNOSIS — Z6839 Body mass index (BMI) 39.0-39.9, adult: Secondary | ICD-10-CM | POA: Insufficient documentation

## 2022-07-27 DIAGNOSIS — R0683 Snoring: Secondary | ICD-10-CM | POA: Diagnosis not present

## 2022-07-27 DIAGNOSIS — E6609 Other obesity due to excess calories: Secondary | ICD-10-CM | POA: Diagnosis not present

## 2022-07-27 DIAGNOSIS — G4719 Other hypersomnia: Secondary | ICD-10-CM | POA: Diagnosis not present

## 2022-07-27 NOTE — Progress Notes (Signed)
Care Coordination Call  Called patient to follow up. Reports she has been feeling better since she took her medications yesterday. Reports some "flutter" in her chest when she stood up, but no further episodes of pre-syncopal symptoms. She did report some dizziness last night but checked her blood sugar and it was 80. She drank a soda.   Reports continued dull headache, but notes this has been constant since her most recent stroke.   Also expresses frustration and a feeling of "shutting down" when thinking about urgent/emergent care, given past negative experiences with the healthcare system and not feeling like she was respected by members of the medical team. Feels better about her current medical team. Notes she is very sensitive to any sensations that are abnormal, and feels she gets worried which exacerbates stress, then increases blood pressure.  She has not heard from nephrology referral; will follow up with referral coordinator about that.   Will follow up in 2 weeks.   Catie Eppie Gibson, PharmD, [facility] [phone]

## 2022-07-29 ENCOUNTER — Other Ambulatory Visit: Payer: Self-pay | Admitting: Pharmacist

## 2022-07-29 NOTE — Progress Notes (Signed)
Care Coordination Call  Called patient, provided phone number to [facility] for her to call and schedule an appointment.   Catie Eppie Gibson, PharmD, [facility] [phone]

## 2022-07-29 NOTE — Procedures (Signed)
Patient Name: Sharon Chan, Dino Date: 07/27/2022 Gender: Female D.O.B: [birth year] Age (years): 46 Referring Provider: Noemi Chapel NP Height (inches): 63 Interpreting Physician: Cyril Mourning MD, [facility] Weight (lbs): 216 RPSGT: Shelah Lewandowsky BMI: 38 MRN: [id number] Neck Size: 17.00 <br> <br> CLINICAL INFORMATION Sleep Study Type: NPSG    Indication for sleep study: excessive daytime sleepiness, frequent night awakenings, nocturia, chronic headaches, Fatigue, Hypertension, Obesity    Epworth Sleepiness Score: 2    SLEEP STUDY TECHNIQUE As per the AASM Manual for the Scoring of Sleep and Associated Events v2.3 (April 2016) with a hypopnea requiring 4% desaturations.  The channels recorded and monitored were frontal, central and occipital EEG, electrooculogram (EOG), submentalis EMG (chin), nasal and oral airflow, thoracic and abdominal wall motion, anterior tibialis EMG, snore microphone, electrocardiogram, and pulse oximetry.  MEDICATIONS Medications self-administered by patient taken the night of the study : ZOLPIDEM TARTRATE, LOSARTAN  SLEEP ARCHITECTURE The study was initiated at 10:31:51 PM and ended at 5:42:26 AM.  Sleep onset time was 6.0 minutes and the sleep efficiency was 92.9%%. The total sleep time was 400 minutes.  Stage REM latency was 299.5 minutes.  The patient spent 5.5%% of the night in stage N1 sleep, 85.4%% in stage N2 sleep, 0.0%% in stage N3 and 9.1% in REM.  Alpha intrusion was absent.  Supine sleep was 15.11%.  RESPIRATORY PARAMETERS The overall apnea/hypopnea index (AHI) was 0.3 per hour. There were 0 total apneas, including 0 obstructive, 0 central and 0 mixed apneas. There were 2 hypopneas and 17 RERAs.  The AHI during Stage REM sleep was 0.0 per hour.  AHI while supine was 2.0 per hour.  The mean oxygen saturation was 97.9%. The minimum SpO2 during sleep was 92.0%.  moderate snoring was noted during this study.  CARDIAC  DATA The 2 lead EKG demonstrated sinus rhythm. The mean heart rate was 70.1 beats per minute. Other EKG findings include: None.   LEG MOVEMENT DATA The total PLMS were 0 with a resulting PLMS index of 0.0. Associated arousal with leg movement index was 0.0 .  IMPRESSIONS - No significant obstructive sleep apnea occurred during this study (AHI = 0.3/h). - The patient had minimal or no oxygen desaturation during the study (Min O2 = 92.0%) - The patient snored with moderate snoring volume. - No cardiac abnormalities were noted during this study. - Clinically significant periodic limb movements did not occur during sleep. No significant associated arousals.   DIAGNOSIS - No evidence of sleep disordered breathing   RECOMMENDATIONS - Avoid alcohol, sedatives and other CNS depressants that may worsen sleep apnea and disrupt normal sleep architecture. - Sleep hygiene should be reviewed to assess factors that may improve sleep quality. - Weight management and regular exercise should be initiated or continued if appropriate.   Cyril Mourning MD Board Certified in Sleep medicine

## 2022-08-01 ENCOUNTER — Telehealth: Payer: Self-pay | Admitting: Nurse Practitioner

## 2022-08-02 ENCOUNTER — Ambulatory Visit (INDEPENDENT_AMBULATORY_CARE_PROVIDER_SITE_OTHER): Payer: Medicare Other | Admitting: Clinical

## 2022-08-02 DIAGNOSIS — F3289 Other specified depressive episodes: Secondary | ICD-10-CM | POA: Diagnosis not present

## 2022-08-02 NOTE — BH Specialist Note (Addendum)
Integrated Behavioral Health Follow Up In-Person Visit  MRN: [id number] Name: Sharon Chan  Number of [place] Clinician visits: 2- Second Visit  Session Start time: 5449   Session End time: 2010  Total time in minutes: 60   Types of Service: Individual psychotherapy  Interpretor:No. Interpretor Name and Language: none  Subjective: Sharon Chan is a 46 y.o. female accompanied by  self. Patient was referred by Lazaro Arms, NP for depression. Patient reports the following symptoms/concerns: depression and anxiety related to health concerns Duration of problem: several years; Severity of problem: severe  Objective: Mood: Euthymic and Affect: Appropriate Risk of harm to self or others: No plan to harm self or others  Patient and/or Family's Strengths/Protective Factors: Social connections, Social and Emotional competence, Concrete supports in place (healthy food, safe environments, etc.), and Sense of purpose  Goals Addressed: Patient will:  Reduce symptoms of: anxiety and depression   Increase knowledge and/or ability of: coping skills and self-management skills   Demonstrate ability to: Increase healthy adjustment to current life circumstances  Progress towards Goals: Ongoing  Interventions: Interventions utilized:  CBT Cognitive Behavioral Therapy and Supportive Counseling Standardized Assessments completed: Not Needed  Supportive counseling today as patient discussed her engagement. Reflective listening and emotional validation provided around navigating a newer relationship. CBT to process thoughts and feelings about self in context of changed abilities since stroke. Challenged some begative thoughts about self.   Patient and/or Family Response: Patient engaged in session.   Assessment: Patient currently experiencing depression and anxiety related to health conditions.   Patient may benefit from CBT to explore unhelpful beliefs  about self and re-frame these beliefs to improve mood and decrease anxiety.  Plan: Follow up with behavioral health clinician on: 09/02/22  Estanislado Emms, [facility] [phone]

## 2022-08-03 NOTE — Telephone Encounter (Signed)
ATC x1.  LVM to return call. 

## 2022-08-04 NOTE — Telephone Encounter (Signed)
ATC x2.  No answer, LVM to return call. ?

## 2022-08-10 ENCOUNTER — Other Ambulatory Visit: Payer: Medicare Other | Admitting: Pharmacist

## 2022-08-10 NOTE — Patient Instructions (Signed)
Sharon Chan,   Keep up the excellent work! Please let me know if you have any questions or concerns!  Check your blood pressure once daily, and any time you have concerning symptoms like headache, chest pain, dizziness, shortness of breath, or vision changes.   Our goal is less than 130/80.  To appropriately check your blood pressure, make sure you do the following:  1) Avoid caffeine, exercise, or tobacco products for 30 minutes before checking. Empty your bladder. 2) Sit with your back supported in a flat-backed chair. Rest your arm on something flat (arm of the chair, table, etc). 3) Sit still with your feet flat on the floor, resting, for at least 5 minutes.  4) Check your blood pressure. Take 1-2 readings.  5) Write down these readings and bring with you to any provider appointments.  Bring your home blood pressure machine with you to a provider's office for accuracy comparison at least once a year.   Make sure you take your blood pressure medications before you come to any office visit, even if you were asked to fast for labs.   Sharon Chan, PharmD, [facility] [phone]

## 2022-08-10 NOTE — Progress Notes (Signed)
Chief Complaint  Patient presents with   Hypertension   Medication Management   Diabetes   Hyperlipidemia    Sharon Chan is a 46 y.o. year old female who presented for a telephone visit.   They were referred to the pharmacist by their PCP for assistance in managing diabetes and hypertension.    Subjective:  Care Team: Primary Care Provider: Fenton Foy, NP ; Next Scheduled Visit: 09/02/22 Confirms she called [facility] and scheduled an appointment in October   Medication Access/Adherence  Current Pharmacy:  [id number] [place] [id number]: [phone]: [phone] [facility] [place] [id number]: [phone]: [phone]  Patient reports affordability concerns with their medications: No  Patient reports access/transportation concerns to their pharmacy: No  Patient reports adherence concerns with their medications:  No  has most of her medications packaged from [facility], plans to transfer over other medications when due for refills   Diabetes:  Current medications: Victoza 0.6 mg weekly   Hypertension:  Current medications: valsartan/HCTZ 320/12.5 mg daily  Patient has a validated, automated, upper arm home BP cuff Current blood pressure readings readings: remaining well controlled, SBP <130  Patient denies hypotensive s/sx including dizziness, lightheadedness.  Patient denies hypertensive symptoms including newheadache, chest pain, shortness of breath   Hyperlipidemia/ASCVD Risk Reduction  Current lipid lowering medications: atorvastatin 40 mg daily - reports she is remembering to take daily  Antiplatelet regimen: 81 mg daily  Health Maintenance  Health Maintenance Due  Topic Date Due   OPHTHALMOLOGY EXAM  Never done   Diabetic  kidney evaluation - Urine ACR  Never done   Hepatitis C Screening  Never done   PAP SMEAR-Modifier  Never done   COLONOSCOPY (Pts 45-46yrInsurance coverage will need to be confirmed)  Never done   FOOT EXAM  01/02/2020   COVID-19 Vaccine (3 - Pfizer series) 04/21/2020   INFLUENZA VACCINE  06/14/2022     Objective: Lab Results  Component Value Date   HGBA1C 6.3 (A) 06/02/2022   HGBA1C 6.3 06/02/2022   HGBA1C 6.3 06/02/2022   HGBA1C 6.3 06/02/2022    Lab Results  Component Value Date   CREATININE 1.03 (H) 06/02/2022   BUN 18 06/02/2022   NA 142 06/02/2022   K 4.4 06/02/2022   CL 106 06/02/2022   CO2 23 06/02/2022    Lab Results  Component Value Date   CHOL 199 03/02/2022   HDL 49 03/02/2022   LDLCALC 138 (H) 03/02/2022   TRIG 66 03/02/2022   CHOLHDL 4.1 03/02/2022    Medications Reviewed Today     Reviewed by HaOsker MasonRPH-CPP (Pharmacist) on 08/10/22 at 1408  Med List Status: <None>   Medication Order Taking? Sig Documenting Provider Last Dose Status Informant  acetaminophen (TYLENOL) 325 MG tablet [id number] 1-2 tablets (325-650 mg total) by mouth every 4 (four) hours as needed for mild pain. AqCameron SprangMD  Active   albuterol (PROVENTIL HFA;VENTOLIN HFA) 108 ([id number]) MCG/ACT inhaler [id number] 2 puffs into the lungs every 6 (six) hours as needed for wheezing or shortness of breath. StAzzie GlatterFNP  Active            Med Note (HJodi MourningCATillie Fantasiaug 24, 2023  2:10 PM)    aspirin EC 81 MG tablet [id number] Yes Take 81 mg by mouth daily. Swallow whole. [provider] Taking Active   atorvastatin (LIPITOR) 40 MG tablet [id number] Yes Take 1 tablet (40 mg total) by mouth daily. Fenton Foy, NP Taking Active   blood glucose meter kit and supplies KIT [id number] Yes Dispense based on patient and insurance preference. Use up to four times daily as directed. (FOR ICD-9 250.00, 250.01). Dennard Nip D, MD Taking Active    cloNIDine (CATAPRES) tablet 0.1 mg [id number]   Fenton Foy, NP  Active   cyclobenzaprine (FLEXERIL) 5 MG tablet [id number] Yes Take 1 tablet (5 mg total) by mouth 3 (three) times daily as needed for muscle spasms. Fenton Foy, NP Taking Active   diphenhydrAMINE (BENADRYL) 25 MG tablet [id number] No Take 25 mg by mouth every 6 (six) hours as needed.  Patient not taking: Reported on 08/10/2022   [provider] Not Taking Active            Med Note Azzie Glatter   Fri Oct 18, 2019  3:35 PM) As needed.   divalproex (DEPAKOTE ER) 500 MG 24 hr tablet [id number] Yes Take 1 tablet every night Cameron Sprang, MD Taking Active   Insulin Pen Needle (BD PEN NEEDLE NANO U/F) 32G X 4 MM MISC [id number] Yes Inject 1 Units into the skin 2 (two) times daily. Azzie Glatter, FNP Taking Active   liraglutide (VICTOZA) 18 MG/3ML SOPN [id number] Yes Inject 1.2 mg into the skin every morning. Fenton Foy, NP Taking Active            Med Note Jodi Mourning, Tillie Fantasia Jul 07, 2022  2:13 PM) 0.6 mg daily  nortriptyline (PAMELOR) 25 MG capsule [id number] Yes Take 2 capsules every night Cameron Sprang, MD Taking Active            Med Note Jodi Mourning, Tillie Fantasia Jul 07, 2022  2:08 PM) Taking 1 QPM  OneTouch Delica Lancets 43X MISC [id number] Yes TES 2 (TWO) TIMES DAILY. Azzie Glatter, FNP Taking Active   topiramate (TOPAMAX) 100 MG tablet [id number] Yes TAKE 1 TABLET IN MORNING AND 2 TABLETS AT [place] Pho, MD Taking Active   valsartan-hydrochlorothiazide (DIOVAN-HCT) 320-12.5 MG tablet [id number] Yes Take 1 tablet by mouth daily. Fenton Foy, NP Taking Active   Vitamin D, Ergocalciferol, (DRISDOL) 1.25 MG (50000 UNIT) CAPS capsule [id number] No Take 1 capsule (50,000 Units total) by mouth every 7 (seven) days.  Patient not taking: Reported on 07/07/2022   Azzie Glatter, FNP Not Taking Active   zolpidem (AMBIEN) 5 MG tablet [id number] Yes TAKE 1 TABLET BY MOUTH EVERY DAY  AT BEDTIME AS NEEDED FOR SLEEP Fenton Foy, NP Taking Active               Assessment/Plan:   Diabetes: - Currently controlled - Recommend to continue current regimen at this time   Hypertension: - Currently controlled - Praised for achievement and maintenance of goal BP - Reviewed appropriate blood pressure monitoring technique and reviewed goal blood pressure. Recommended to check home blood pressure and heart rate periodically - Recommend to continue current regimen at this time   Hyperlipidemia/ASCVD Risk Reduction: - Currently uncontrolled but likely related to prior nonadherence - Recommend to continue current regimen at this time   Follow Up Plan: phone call in ~ 4 weeks  Catie T. Jodi Mourning,  PharmD, [facility] [phone]

## 2022-08-12 ENCOUNTER — Ambulatory Visit: Payer: Medicare Other

## 2022-08-12 ENCOUNTER — Encounter: Payer: Self-pay | Admitting: *Deleted

## 2022-08-12 DIAGNOSIS — I350 Nonrheumatic aortic (valve) stenosis: Secondary | ICD-10-CM | POA: Diagnosis not present

## 2022-08-12 DIAGNOSIS — Z8673 Personal history of transient ischemic attack (TIA), and cerebral infarction without residual deficits: Secondary | ICD-10-CM

## 2022-08-12 NOTE — Telephone Encounter (Signed)
Unable to reach letter sent

## 2022-08-15 ENCOUNTER — Other Ambulatory Visit: Payer: Self-pay | Admitting: Neurology

## 2022-08-17 ENCOUNTER — Other Ambulatory Visit: Payer: Self-pay | Admitting: Nurse Practitioner

## 2022-08-17 DIAGNOSIS — R0602 Shortness of breath: Secondary | ICD-10-CM

## 2022-08-30 NOTE — Telephone Encounter (Signed)
Patient called back and is scheduled for follow up on 10/17.  Nothing further needed.

## 2022-08-31 ENCOUNTER — Encounter: Payer: Self-pay | Admitting: Nurse Practitioner

## 2022-08-31 ENCOUNTER — Ambulatory Visit (INDEPENDENT_AMBULATORY_CARE_PROVIDER_SITE_OTHER): Payer: Medicare Other | Admitting: Nurse Practitioner

## 2022-08-31 VITALS — BP 120/80 | HR 88 | Ht 63.0 in | Wt 223.0 lb

## 2022-08-31 DIAGNOSIS — G4700 Insomnia, unspecified: Secondary | ICD-10-CM | POA: Diagnosis not present

## 2022-08-31 DIAGNOSIS — G4719 Other hypersomnia: Secondary | ICD-10-CM

## 2022-08-31 MED ORDER — ESZOPICLONE 1 MG PO TABS: 1.0000 mg | ORAL_TABLET | Freq: Every evening | ORAL | 0 refills | Status: DC | PRN

## 2022-08-31 NOTE — Assessment & Plan Note (Addendum)
No evidence of OSA on recent sleep study.  Struggled with insomnia since her initial stroke. She has tried a few different medications, most recently Ambien to help with sleep.  She does have good response to this as far as sleep latency goes but is still struggling with sleep maintenance.  No significant mental health issues. She is on a topamax and depakote at night. Takes ambien 5 mg a few nights a week. Advised that we could try her on Lunesta. Reviewed risks and benefits. Encouraged to keep a sleep diary and work on sleep hygiene.   Patient Instructions  Stop Ambien. Start Lunesta 1 mg At bedtime as needed for sleep   Keep a sleep diary to help you and your health care provider figure out what could be causing your insomnia. Write down: ? When you sleep. ? When you wake up during the night. ? How well you sleep and how rested you feel the next day. ? Any side effects of medicines you are taking. ? What you eat and drink. Cool, dark sleeping environment. Limit screen use before bedtime.Stick to a routine that includes going to bed and waking up at the same times every day and night. This can help you fall asleep faster. Consider making a quiet activity, such as reading, part of your nighttime routine.  Follow up in 4 months to see how things are going with Dr. Ander Slade (new pt 30 min slot) or Katie Kyriakos Babler,NP. If symptoms do not improve, please contact office for sooner follow up

## 2022-08-31 NOTE — Progress Notes (Signed)
$'@Patient'i$  ID: Sharon Chan, female    DOB: [birth year], 46 y.o.   MRN: [id number]  Chief Complaint  Patient presents with   Follow-up    Referring provider: Fenton Foy, NP  HPI: 46 year old female, never smoker referred for sleep consult. Past medical history significant for HTN, hx of stroke with right sided hemiparesis, DM, polycystic kidney disease, HLD, sickle cell trait, insomnia, anxiety, obesity.   TEST/EVENTS:  07/27/2022 NPSG: AHI 0.3/h; SpO2 low 92%  06/28/2022: OV with Rasheema Truluck NP for sleep consult referred by Lazaro Arms, NP.  She was also encouraged to be evaluated by her neurologist.  She suffered from a stroke in 2018.  Since then, she has had trouble with chronic headaches and difficulty sleeping at night.  Wakes almost every hour.  She also has to use the bathroom frequently at night; has always attributed this to her polycystic kidney disease.  She also has excessive daytime fatigue symptoms; doesn't necessarily feel as though she could fall asleep but always feels run down with little energy.  States that after 6 PM, she is done for and is not able to do anything else.  Can fall asleep just being on the computer.  She has also had some trouble with her blood pressure recently.  Has not been told that she snores or stops breathing at night.  She denies any sleep parasomnia/paralysis, narcolepsy or cataplexy. She does not drive.  She goes to bed between 10 to 11 PM.  Can take anywhere from 30 minutes to a few hours for her to fall asleep.  Some mornings, she gets up around 3 AM to go for a walk for exercise.  She will then come back home and lay back down and go to sleep for a few more hours and then officially get out of the bed at 6 AM.  When she does not do this, she gets up at 6 AM.  She takes Ambien some nights to help her fall asleep, which she has been on since 2019.  Seems to help with sleep latency but not sleep maintenance.  She has been on disability since her  stroke in 2018.  Her weight is up about 20 to 30 pounds.  She has never had a formal sleep study before.  She is on multiple sedating medicines including Flexeril, Depakote and Topamax.  Her neurologist is concerned that she has had another recent cerebral infarction based on her imaging.  They are ordering a heart monitor to rule out underlying arrhythmia.  08/31/2022: Today - follow up Patient presents today for follow up to discuss sleep study results, which did not reveal any evidence of sleep disordered breathing. She is still struggling with sleep maintenance.  She does not have as much trouble falling asleep, as long as she takes her Ambien but this does not seem to do much for her as far as staying asleep at night.  She is tired throughout the day.  Worse in the afternoons.  Still struggles with chronic daily headaches.  Being followed by neurology for recurrent strokes.  Does not drive.  She had a syncopal event in September.  Recommended to go to the ER for evaluation but did not go.  Her heart monitor showed in normal sinus rhythm at the time.  Allergies  Allergen Reactions   Penicillins Anaphylaxis    Immunization History  Administered Date(s) Administered   Influenza,inj,Quad PF,6+ Mos 09/12/2017, 10/01/2018, 11/20/2019   PFIZER(Purple Top)SARS-COV-2 Vaccination 02/06/2020, 02/25/2020  Past Medical History:  Diagnosis Date   Anxiety    CVA (cerebral vascular accident) ([place])    Depression    DM type 2 (diabetes mellitus, type 2) ([place])    HLD (hyperlipidemia)    HTN (hypertension)    Joint pain    Migraines    Neck pain    Obese    Polycystic disease, ovaries    Right sided weakness    Seizures (H[facility])    Shoulder pain    Sickle cell trait (HCC)    Thyroid nodule    Vitamin D deficiency 01/2020    Tobacco History: Social History   Tobacco Use  Smoking Status Never  Smokeless Tobacco Never   Counseling given: Not Answered   Outpatient Medications Prior to  Visit  Medication Sig Dispense Refill   albuterol (PROVENTIL HFA;VENTOLIN HFA) 108 (90 Base) MCG/ACT inhaler Inhale 2 puffs into the lungs every 6 (six) hours as needed for wheezing or shortness of breath. 1 Inhaler 11   acetaminophen (TYLENOL) 325 MG tablet Take 1-2 tablets (325-650 mg total) by mouth every 4 (four) hours as needed for mild pain.     aspirin EC 81 MG tablet Take 81 mg by mouth daily. Swallow whole.     atorvastatin (LIPITOR) 40 MG tablet Take 1 tablet (40 mg total) by mouth daily. 90 tablet 3   blood glucose meter kit and supplies KIT Dispense based on patient and insurance preference. Use up to four times daily as directed. (FOR ICD-9 250.00, 250.01). 1 each 0   cyclobenzaprine (FLEXERIL) 5 MG tablet Take 1 tablet (5 mg total) by mouth 3 (three) times daily as needed for muscle spasms. 30 tablet 1   diphenhydrAMINE (BENADRYL) 25 MG tablet Take 25 mg by mouth every 6 (six) hours as needed. (Patient not taking: Reported on 08/10/2022)     divalproex (DEPAKOTE ER) 500 MG 24 hr tablet Take 1 tablet every night 30 tablet 11   Insulin Pen Needle (BD PEN NEEDLE NANO U/F) 32G X 4 MM MISC Inject 1 Units into the skin 2 (two) times daily. 100 each 12   liraglutide (VICTOZA) 18 MG/3ML SOPN Inject 1.2 mg into the skin every morning. 3 mL 3   nortriptyline (PAMELOR) 25 MG capsule Take 2 capsules every night 180 capsule 6   OneTouch Delica Lancets 07M MISC TES 2 (TWO) TIMES DAILY. 100 each 5   topiramate (TOPAMAX) 100 MG tablet TAKE 1 TABLET IN MORNING AND 2 TABLETS AT NIGHT 90 tablet 6   valsartan-hydrochlorothiazide (DIOVAN-HCT) 320-12.5 MG tablet Take 1 tablet by mouth daily. 90 tablet 3   Vitamin D, Ergocalciferol, (DRISDOL) 1.25 MG (50000 UNIT) CAPS capsule Take 1 capsule (50,000 Units total) by mouth every 7 (seven) days. (Patient not taking: Reported on 07/07/2022) 4 capsule 6   zolpidem (AMBIEN) 5 MG tablet TAKE 1 TABLET BY MOUTH EVERY DAY AT BEDTIME AS NEEDED FOR SLEEP 30 tablet 0    Facility-Administered Medications Prior to Visit  Medication Dose Route Frequency Provider Last Rate Last Admin   cloNIDine (CATAPRES) tablet 0.1 mg  0.1 mg Oral Once Fenton Foy, NP         Review of Systems:   Constitutional: +excessive daytime fatigue, weight gain (20-30 lb). No night sweats, fevers, chills, or lassitude. HEENT: +chronic headaches. No difficulty swallowing, tooth/dental problems, or sore throat.  CV:  No chest pain, orthopnea, PND, swelling in lower extremities, anasarca, dizziness, palpitations, syncope Resp: No shortness of breath with exertion or  at rest. No excess mucus or change in color of mucus. No productive or non-productive. No hemoptysis. No wheezing.  No chest wall deformity GU: +nocturia. No dysuria, change in color of urine, urgency or frequency.  No flank pain, no hematuria  MSK:  No joint pain or swelling.  No decreased range of motion.  No back pain. Neuro: No dizziness or lightheadedness. +right sided weakness (baseline) Psych: No depression or anxiety. Mood stable.     Physical Exam:  BP 120/80 (BP Location: Right Arm)   Pulse 88   Ht _0  (1.6 m)   Wt 223 lb (101.2 kg)   SpO2 100%   BMI 39.50 kg/m   GEN: Pleasant, interactive, well-appearing; obese; in no acute distress. HEENT:  Normocephalic and atraumatic. PERRLA. Sclera white. Nasal turbinates pink, moist and patent bilaterally. No rhinorrhea present. Oropharynx pink and moist, without exudate or edema. No lesions, ulcerations, or postnasal drip. Mallampati III NECK:  Supple w/ fair ROM.  CV: RRR, no m/r/g, no peripheral edema. Pulses intact, +2 bilaterally. No cyanosis, pallor or clubbing. PULMONARY:  Unlabored, regular breathing. Clear bilaterally A&P w/o wheezes/rales/rhonchi. No accessory muscle use. No dullness to percussion. GI: BS present and normoactive. Soft, non-tender to palpation. MSK: No erythema, warmth or tenderness. Neuro: A/Ox3.  Skin: Warm, no lesions or  rashe Psych: Normal affect and behavior. Judgement and thought content appropriate.     Lab Results:  CBC    Component Value Date/Time   WBC 6.2 06/02/2022 1017   WBC 5.3 09/12/2017 1121   RBC 5.37 (H) 06/02/2022 1017   RBC 4.90 09/12/2017 1121   HGB 12.8 06/02/2022 1017   HCT 39.9 06/02/2022 1017   PLT 326 06/02/2022 1017   MCV 74 (L) 06/02/2022 1017   MCH 23.8 (L) 06/02/2022 1017   MCH 25.1 (L) 09/12/2017 1121   MCHC 32.1 06/02/2022 1017   MCHC 32.6 09/12/2017 1121   RDW 18.6 (H) 06/02/2022 1017   LYMPHSABS 2.7 02/03/2020 1412   MONOABS 396 03/17/2017 1513   EOSABS 0.2 02/03/2020 1412   BASOSABS 0.1 02/03/2020 1412    BMET    Component Value Date/Time   NA 142 06/02/2022 1017   K 4.4 06/02/2022 1017   CL 106 06/02/2022 1017   CO2 23 06/02/2022 1017   GLUCOSE 96 06/02/2022 1017   GLUCOSE 101 (H) 09/12/2017 1121   BUN 18 06/02/2022 1017   CREATININE 1.03 (H) 06/02/2022 1017   CREATININE 0.92 09/12/2017 1121   CALCIUM 9.4 06/02/2022 1017   GFRNONAA 72 02/03/2020 1412   GFRNONAA 71 03/17/2017 1513   GFRAA 83 02/03/2020 1412   GFRAA 82 03/17/2017 1513    BNP No results found for: "BNP"   Imaging:  Cardiac event monitor  Result Date: 08/06/2022   Patient had a minimum heart rate of 51 bpm, maximum heart rate of 176 bpm, and average heart rate of 71 bpm.   Predominant underlying rhythm was sinus rhythm.   Isolated PACs were rare (<1.0%).   Isolated PVCs were rare (<1.0%).   No evidence of significant heart block.   Triggered and diary events associated with sinus rhythm and sinus tachycardia. No evidence of cardiac syncope.          No data to display          No results found for: "NITRICOXIDE"      Assessment & Plan:   Chronic insomnia No evidence of OSA on recent sleep study.  Struggled with insomnia since her initial  stroke. She has tried a few different medications, most recently Ambien to help with sleep.  She does have good response to  this as far as sleep latency goes but is still struggling with sleep maintenance.  No significant mental health issues. She is on a topamax and depakote at night. Takes ambien 5 mg a few nights a week. Advised that we could try her on Lunesta. Reviewed risks and benefits. Encouraged to keep a sleep diary and work on sleep hygiene.   Patient Instructions  Stop Ambien. Start Lunesta 1 mg At bedtime as needed for sleep   Keep a sleep diary to help you and your health care provider figure out what could be causing your insomnia. Write down: When you sleep. When you wake up during the night. How well you sleep and how rested you feel the next day. Any side effects of medicines you are taking. What you eat and drink. Cool, dark sleeping environment. Limit screen use before bedtime.Stick to a routine that includes going to bed and waking up at the same times every day and night. This can help you fall asleep faster. Consider making a quiet activity, such as reading, part of your nighttime routine.  Follow up in 4 months to see how things are going with Dr. Ander Slade (new pt 30 min slot) or Katie Belvia Gotschall,NP. If symptoms do not improve, please contact office for sooner follow up     Excessive daytime sleepiness No evidence of sleep disordered breathing. She is on topamax and depakote, which we discussed can contribute to sleepiness. See above plan.   I spent 38 minutes of dedicated to the care of this patient on the date of this encounter to include pre-visit review of records, face-to-face time with the patient discussing conditions above, post visit ordering of testing, clinical documentation with the electronic health record, making appropriate referrals as documented, and communicating necessary findings to members of the patients care team.  Clayton Bibles, NP 08/31/2022  Pt aware and understands NP's role.

## 2022-08-31 NOTE — Patient Instructions (Addendum)
Stop Ambien. Start Lunesta 1 mg At bedtime as needed for sleep   Keep a sleep diary to help you and your health care provider figure out what could be causing your insomnia. Write down: When you sleep. When you wake up during the night. How well you sleep and how rested you feel the next day. Any side effects of medicines you are taking. What you eat and drink. Cool, dark sleeping environment. Limit screen use before bedtime.Stick to a routine that includes going to bed and waking up at the same times every day and night. This can help you fall asleep faster. Consider making a quiet activity, such as reading, part of your nighttime routine.  Follow up in 4 months to see how things are going with Dr. Ander Slade (new pt 30 min slot) or Katie Leydy Worthey,NP. If symptoms do not improve, please contact office for sooner follow up

## 2022-08-31 NOTE — Assessment & Plan Note (Signed)
No evidence of sleep disordered breathing. She is on topamax and depakote, which we discussed can contribute to sleepiness. See above plan.

## 2022-09-01 DIAGNOSIS — N390 Urinary tract infection, site not specified: Secondary | ICD-10-CM | POA: Diagnosis not present

## 2022-09-01 DIAGNOSIS — E1122 Type 2 diabetes mellitus with diabetic chronic kidney disease: Secondary | ICD-10-CM | POA: Diagnosis not present

## 2022-09-01 DIAGNOSIS — R809 Proteinuria, unspecified: Secondary | ICD-10-CM | POA: Diagnosis not present

## 2022-09-01 DIAGNOSIS — I639 Cerebral infarction, unspecified: Secondary | ICD-10-CM | POA: Diagnosis not present

## 2022-09-01 DIAGNOSIS — N182 Chronic kidney disease, stage 2 (mild): Secondary | ICD-10-CM | POA: Diagnosis not present

## 2022-09-01 DIAGNOSIS — I129 Hypertensive chronic kidney disease with stage 1 through stage 4 chronic kidney disease, or unspecified chronic kidney disease: Secondary | ICD-10-CM | POA: Diagnosis not present

## 2022-09-01 DIAGNOSIS — Z87898 Personal history of other specified conditions: Secondary | ICD-10-CM | POA: Diagnosis not present

## 2022-09-02 ENCOUNTER — Ambulatory Visit (INDEPENDENT_AMBULATORY_CARE_PROVIDER_SITE_OTHER): Payer: Medicaid Other | Admitting: Clinical

## 2022-09-02 ENCOUNTER — Encounter: Payer: Self-pay | Admitting: Nurse Practitioner

## 2022-09-02 ENCOUNTER — Ambulatory Visit (INDEPENDENT_AMBULATORY_CARE_PROVIDER_SITE_OTHER): Payer: Medicare Other | Admitting: Nurse Practitioner

## 2022-09-02 VITALS — BP 142/105 | HR 77 | Ht 63.0 in | Wt 221.4 lb

## 2022-09-02 DIAGNOSIS — R5383 Other fatigue: Secondary | ICD-10-CM

## 2022-09-02 DIAGNOSIS — Z9189 Other specified personal risk factors, not elsewhere classified: Secondary | ICD-10-CM | POA: Diagnosis not present

## 2022-09-02 DIAGNOSIS — F3289 Other specified depressive episodes: Secondary | ICD-10-CM

## 2022-09-02 NOTE — BH Specialist Note (Addendum)
Integrated Behavioral Health Follow Up In-Person Visit  MRN: [id number] Name: Sharon Chan  Number of [facility] Clinician visits: 3- Third Visit  Session Start time: 6195   Session End time: 1140  Total time in minutes: 60   Types of Service: Individual psychotherapy  Interpretor:No. Interpretor Name and Language: none  Subjective: Sharon Chan is a 46 y.o. female accompanied by  self. Patient was referred by Lazaro Arms, NP for depression. Patient reports the following symptoms/concerns: depression and anxiety related to health concerns Duration of problem: several years; Severity of problem: severe  Objective: Mood: Euthymic and Affect: Appropriate Risk of harm to self or others: No plan to harm self or others  Patient and/or Family's Strengths/Protective Factors: Social connections, Social and Emotional competence, Concrete supports in place (healthy food, safe environments, etc.), and Sense of purpose  Goals Addressed: Patient will:  Reduce symptoms of: anxiety and depression   Increase knowledge and/or ability of: coping skills and self-management skills   Demonstrate ability to: Increase healthy adjustment to current life circumstances  Progress towards Goals: Ongoing  Interventions: Interventions utilized:  Supportive Counseling Standardized Assessments completed: Not Needed  Supportive counseling today to process emotions and thoughts related to self in the context of health conditions and sense of purpose. Patient exhibits healthy coping skills. Emotional validation and reflective listening provided. Patient navigating prioritizing her own needs.  Patient and/or Family Response: Patient engaged in session.   Assessment: Patient currently experiencing depression and anxiety related to health conditions.   Patient may benefit from CBT to explore unhelpful beliefs about self and re-frame these beliefs to improve mood and  decrease anxiety.  Plan: Follow up with behavioral health clinician on: 09/30/22  Estanislado Emms, LCSW

## 2022-09-02 NOTE — Progress Notes (Signed)
_0  ID: [name], female    DOB: 08/19/1976, 46 y.o.   MRN: [id number]  Chief Complaint  Patient presents with   Insomnia   Fatigue    Referring provider: Fenton Foy, NP   HPI  46 year old female with history of hypertension, CVA, migraines, diabetes, polycystic ovary, thyroid nodule, peripheral neuropathy, right-sided weakness, polycystic kidney disease, anxiety.  Patient presents today for follow-up visit.  She does state that she has been having fatigue recently.  Patient also complains of insomnia but has seen sleep specialist and they have started her on Lunesta.  She has not actually taken this medication yet but will start this weekend.  Patient is also concerned about fertility issues.  She has recently got engaged and would like to have a baby.  She has been having irregular menstrual cycles.  We will refer her to OB/GYN. Denies f/c/s, n/v/d, hemoptysis, PND, leg swelling Denies chest pain or edema      Allergies  Allergen Reactions   Penicillins Anaphylaxis    Immunization History  Administered Date(s) Administered   Influenza,inj,Quad PF,6+ Mos 09/12/2017, 10/01/2018, 11/20/2019   PFIZER(Purple Top)SARS-COV-2 Vaccination 02/06/2020, 02/25/2020    Past Medical History:  Diagnosis Date   Anxiety    CVA (cerebral vascular accident) ([place])    Depression    DM type 2 (diabetes mellitus, type 2) ([place])    HLD (hyperlipidemia)    HTN (hypertension)    Joint pain    Migraines    Neck pain    Obese    Polycystic disease, ovaries    Right sided weakness    Seizures (HCC)    Shoulder pain    Sickle cell trait (HCC)    Thyroid nodule    Vitamin D deficiency 01/2020    Tobacco History: Social History   Tobacco Use  Smoking Status Never  Smokeless Tobacco Never   Counseling given: Not Answered   Outpatient Encounter Medications as of 09/02/2022  Medication Sig   acetaminophen (TYLENOL) 325 MG tablet Take 1-2 tablets (325-650 mg  total) by mouth every 4 (four) hours as needed for mild pain.   albuterol (PROVENTIL HFA;VENTOLIN HFA) 108 (90 Base) MCG/ACT inhaler Inhale 2 puffs into the lungs every 6 (six) hours as needed for wheezing or shortness of breath.   aspirin EC 81 MG tablet Take 81 mg by mouth daily. Swallow whole.   atorvastatin (LIPITOR) 40 MG tablet Take 1 tablet (40 mg total) by mouth daily.   blood glucose meter kit and supplies KIT Dispense based on patient and insurance preference. Use up to four times daily as directed. (FOR ICD-9 250.00, 250.01).   cyclobenzaprine (FLEXERIL) 5 MG tablet Take 1 tablet (5 mg total) by mouth 3 (three) times daily as needed for muscle spasms.   diphenhydrAMINE (BENADRYL) 25 MG tablet Take 25 mg by mouth every 6 (six) hours as needed.   divalproex (DEPAKOTE ER) 500 MG 24 hr tablet Take 1 tablet every night   eszopiclone (LUNESTA) 1 MG TABS tablet Take 1 tablet (1 mg total) by mouth at bedtime as needed for sleep. Take immediately before bedtime   Insulin Pen Needle (BD PEN NEEDLE NANO U/F) 32G X 4 MM MISC Inject 1 Units into the skin 2 (two) times daily.   liraglutide (VICTOZA) 18 MG/3ML SOPN Inject 1.2 mg into the skin every morning.   nortriptyline (PAMELOR) 25 MG capsule Take 2 capsules every night   OneTouch Delica Lancets 78G MISC TES 2 (TWO) TIMES DAILY.  topiramate (TOPAMAX) 100 MG tablet TAKE 1 TABLET IN MORNING AND 2 TABLETS AT NIGHT   valsartan-hydrochlorothiazide (DIOVAN-HCT) 320-12.5 MG tablet Take 1 tablet by mouth daily.   Vitamin D, Ergocalciferol, (DRISDOL) 1.25 MG (50000 UNIT) CAPS capsule Take 1 capsule (50,000 Units total) by mouth every 7 (seven) days.   Facility-Administered Encounter Medications as of 09/02/2022  Medication   cloNIDine (CATAPRES) tablet 0.1 mg     Review of Systems  Review of Systems  Constitutional: Negative.   HENT: Negative.    Cardiovascular: Negative.   Gastrointestinal: Negative.   Allergic/Immunologic: Negative.    Neurological: Negative.   Psychiatric/Behavioral: Negative.         Physical Exam  BP (!) 142/105   Pulse 77   Ht _0  (1.6 m)   Wt 221 lb 6.4 oz (100.4 kg)   SpO2 100%   BMI 39.22 kg/m   Wt Readings from Last 5 Encounters:  09/02/22 221 lb 6.4 oz (100.4 kg)  08/31/22 223 lb (101.2 kg)  07/27/22 216 lb (98 kg)  06/28/22 220 lb 9.6 oz (100.1 kg)  06/23/22 216 lb (98 kg)     Physical Exam Vitals and nursing note reviewed.  Constitutional:      General: She is not in acute distress.    Appearance: She is well-developed.  Cardiovascular:     Rate and Rhythm: Normal rate and regular rhythm.  Pulmonary:     Effort: Pulmonary effort is normal.     Breath sounds: Normal breath sounds.  Neurological:     Mental Status: She is alert and oriented to person, place, and time.       Assessment & Plan:   Other fatigue - CBC - Comprehensive metabolic panel   2. At risk for fertility problems  - Ambulatory referral to Obstetrics / Gynecology   Follow up:  Follow up in 6 months or sooner     Fenton Foy, NP 09/02/2022

## 2022-09-02 NOTE — Assessment & Plan Note (Signed)
-   CBC - Comprehensive metabolic panel   2. At risk for fertility problems  - Ambulatory referral to Obstetrics / Gynecology   Follow up:  Follow up in 6 months or sooner

## 2022-09-02 NOTE — Progress Notes (Deleted)
Pain 8 head   Fall yes blance off  no more tha 2  Safe yes   [phone]

## 2022-09-02 NOTE — Patient Instructions (Addendum)
1. Other fatigue  - CBC - Comprehensive metabolic panel   2. At risk for fertility problems  - Ambulatory referral to Obstetrics / Gynecology   Follow up:  Follow up in 6 months or sooner

## 2022-09-03 LAB — COMPREHENSIVE METABOLIC PANEL
ALT: 11 IU/L (ref 0–32)
AST: 12 IU/L (ref 0–40)
Albumin/Globulin Ratio: 1.8 (ref 1.2–2.2)
Albumin: 4.2 g/dL (ref 3.9–4.9)
Alkaline Phosphatase: 88 IU/L (ref 44–121)
BUN/Creatinine Ratio: 9 (ref 9–23)
BUN: 8 mg/dL (ref 6–24)
Bilirubin Total: 0.2 mg/dL (ref 0.0–1.2)
CO2: 22 mmol/L (ref 20–29)
Calcium: 9.1 mg/dL (ref 8.7–10.2)
Chloride: 107 mmol/L — ABNORMAL HIGH (ref 96–106)
Creatinine, Ser: 0.86 mg/dL (ref 0.57–1.00)
Globulin, Total: 2.3 g/dL (ref 1.5–4.5)
Glucose: 102 mg/dL — ABNORMAL HIGH (ref 70–99)
Potassium: 4 mmol/L (ref 3.5–5.2)
Sodium: 142 mmol/L (ref 134–144)
Total Protein: 6.5 g/dL (ref 6.0–8.5)
eGFR: 83 mL/min/{1.73_m2} (ref >59)

## 2022-09-03 LAB — CBC
Hematocrit: 39.9 % (ref 34.0–46.6)
Hemoglobin: 12.7 g/dL (ref 11.1–15.9)
MCH: 24.8 pg — ABNORMAL LOW (ref 26.6–33.0)
MCHC: 31.8 g/dL (ref 31.5–35.7)
MCV: 78 fL — ABNORMAL LOW (ref 79–97)
Platelets: 302 10*3/uL (ref 150–450)
RBC: 5.12 x10E6/uL (ref 3.77–5.28)
RDW: 16.8 % — ABNORMAL HIGH (ref 11.7–15.4)
WBC: 5.9 10*3/uL (ref 3.4–10.8)

## 2022-09-05 ENCOUNTER — Inpatient Hospital Stay (HOSPITAL_COMMUNITY): Payer: Medicare Other

## 2022-09-05 ENCOUNTER — Other Ambulatory Visit: Payer: Self-pay

## 2022-09-05 ENCOUNTER — Inpatient Hospital Stay (HOSPITAL_COMMUNITY)
Admission: RE | Admit: 2022-09-05 | Discharge: 2022-09-09 | DRG: 880 | Disposition: A | Discharge status: Home / Self Care | Payer: Medicare Other | Source: Ambulatory Visit | Attending: Neurology | Admitting: Neurology

## 2022-09-05 DIAGNOSIS — Z794 Long term (current) use of insulin: Secondary | ICD-10-CM

## 2022-09-05 DIAGNOSIS — F419 Anxiety disorder, unspecified: Secondary | ICD-10-CM | POA: Diagnosis present

## 2022-09-05 DIAGNOSIS — G43909 Migraine, unspecified, not intractable, without status migrainosus: Secondary | ICD-10-CM | POA: Diagnosis present

## 2022-09-05 DIAGNOSIS — F32A Depression, unspecified: Secondary | ICD-10-CM | POA: Diagnosis present

## 2022-09-05 DIAGNOSIS — D573 Sickle-cell trait: Secondary | ICD-10-CM | POA: Diagnosis present

## 2022-09-05 DIAGNOSIS — G4700 Insomnia, unspecified: Secondary | ICD-10-CM | POA: Diagnosis not present

## 2022-09-05 DIAGNOSIS — F445 Conversion disorder with seizures or convulsions: Secondary | ICD-10-CM | POA: Diagnosis present

## 2022-09-05 DIAGNOSIS — Z83438 Family history of other disorder of lipoprotein metabolism and other lipidemia: Secondary | ICD-10-CM | POA: Diagnosis not present

## 2022-09-05 DIAGNOSIS — Z79899 Other long term (current) drug therapy: Secondary | ICD-10-CM

## 2022-09-05 DIAGNOSIS — Z7982 Long term (current) use of aspirin: Secondary | ICD-10-CM

## 2022-09-05 DIAGNOSIS — Z6839 Body mass index (BMI) 39.0-39.9, adult: Secondary | ICD-10-CM | POA: Diagnosis not present

## 2022-09-05 DIAGNOSIS — Z8673 Personal history of transient ischemic attack (TIA), and cerebral infarction without residual deficits: Secondary | ICD-10-CM

## 2022-09-05 DIAGNOSIS — R251 Tremor, unspecified: Secondary | ICD-10-CM | POA: Diagnosis not present

## 2022-09-05 DIAGNOSIS — Z72820 Sleep deprivation: Secondary | ICD-10-CM

## 2022-09-05 DIAGNOSIS — E669 Obesity, unspecified: Secondary | ICD-10-CM | POA: Diagnosis present

## 2022-09-05 DIAGNOSIS — E282 Polycystic ovarian syndrome: Secondary | ICD-10-CM | POA: Diagnosis present

## 2022-09-05 DIAGNOSIS — E559 Vitamin D deficiency, unspecified: Secondary | ICD-10-CM | POA: Diagnosis not present

## 2022-09-05 DIAGNOSIS — Z833 Family history of diabetes mellitus: Secondary | ICD-10-CM | POA: Diagnosis not present

## 2022-09-05 DIAGNOSIS — E785 Hyperlipidemia, unspecified: Secondary | ICD-10-CM | POA: Diagnosis not present

## 2022-09-05 DIAGNOSIS — I10 Essential (primary) hypertension: Secondary | ICD-10-CM | POA: Diagnosis present

## 2022-09-05 DIAGNOSIS — E876 Hypokalemia: Secondary | ICD-10-CM | POA: Diagnosis not present

## 2022-09-05 DIAGNOSIS — Z803 Family history of malignant neoplasm of breast: Secondary | ICD-10-CM | POA: Diagnosis not present

## 2022-09-05 DIAGNOSIS — R569 Unspecified convulsions: Secondary | ICD-10-CM | POA: Diagnosis not present

## 2022-09-05 DIAGNOSIS — G43E09 Chronic migraine with aura, not intractable, without status migrainosus: Secondary | ICD-10-CM

## 2022-09-05 DIAGNOSIS — E119 Type 2 diabetes mellitus without complications: Secondary | ICD-10-CM | POA: Diagnosis not present

## 2022-09-05 DIAGNOSIS — Z808 Family history of malignant neoplasm of other organs or systems: Secondary | ICD-10-CM

## 2022-09-05 DIAGNOSIS — Z8249 Family history of ischemic heart disease and other diseases of the circulatory system: Secondary | ICD-10-CM | POA: Diagnosis not present

## 2022-09-05 LAB — CBC WITH DIFFERENTIAL/PLATELET
Abs Immature Granulocytes: 0.02 10*3/uL (ref 0.00–0.07)
Basophils Absolute: 0.1 10*3/uL (ref 0.0–0.1)
Basophils Relative: 1 %
Eosinophils Absolute: 0.1 10*3/uL (ref 0.0–0.5)
Eosinophils Relative: 1 %
HCT: 35.3 % — ABNORMAL LOW (ref 36.0–46.0)
Hemoglobin: 11.9 g/dL — ABNORMAL LOW (ref 12.0–15.0)
Immature Granulocytes: 0 %
Lymphocytes Relative: 38 %
Lymphs Abs: 2.2 10*3/uL (ref 0.7–4.0)
MCH: 25 pg — ABNORMAL LOW (ref 26.0–34.0)
MCHC: 33.7 g/dL (ref 30.0–36.0)
MCV: 74.2 fL — ABNORMAL LOW (ref 80.0–100.0)
Monocytes Absolute: 0.5 10*3/uL (ref 0.1–1.0)
Monocytes Relative: 8 %
Neutro Abs: 2.9 10*3/uL (ref 1.7–7.7)
Neutrophils Relative %: 52 %
Platelets: 283 10*3/uL (ref 150–400)
RBC: 4.76 MIL/uL (ref 3.87–5.11)
RDW: 16.1 % — ABNORMAL HIGH (ref 11.5–15.5)
WBC: 5.7 10*3/uL (ref 4.0–10.5)
nRBC: 0 % (ref 0.0–0.2)

## 2022-09-05 LAB — COMPREHENSIVE METABOLIC PANEL
ALT: 11 U/L (ref 0–44)
AST: 15 U/L (ref 15–41)
Albumin: 3.3 g/dL — ABNORMAL LOW (ref 3.5–5.0)
Alkaline Phosphatase: 69 U/L (ref 38–126)
Anion gap: 7 (ref 5–15)
BUN: 10 mg/dL (ref 6–20)
CO2: 23 mmol/L (ref 22–32)
Calcium: 8.7 mg/dL — ABNORMAL LOW (ref 8.9–10.3)
Chloride: 109 mmol/L (ref 98–111)
Creatinine, Ser: 0.81 mg/dL (ref 0.44–1.00)
GFR, Estimated: >60 mL/min (ref >60)
Glucose, Bld: 120 mg/dL — ABNORMAL HIGH (ref 70–99)
Potassium: 3.2 mmol/L — ABNORMAL LOW (ref 3.5–5.1)
Sodium: 139 mmol/L (ref 135–145)
Total Bilirubin: 0.4 mg/dL (ref 0.3–1.2)
Total Protein: 6.2 g/dL — ABNORMAL LOW (ref 6.5–8.1)

## 2022-09-05 LAB — PHOSPHORUS: Phosphorus: 2.3 mg/dL — ABNORMAL LOW (ref 2.5–4.6)

## 2022-09-05 LAB — VALPROIC ACID LEVEL: Valproic Acid Lvl: <10 ug/mL — ABNORMAL LOW (ref 50.0–100.0)

## 2022-09-05 LAB — MAGNESIUM: Magnesium: 2 mg/dL (ref 1.7–2.4)

## 2022-09-05 LAB — PROTIME-INR
INR: 1.1 (ref 0.8–1.2)
Prothrombin Time: 13.8 seconds (ref 11.4–15.2)

## 2022-09-05 LAB — HIV ANTIBODY (ROUTINE TESTING W REFLEX): HIV Screen 4th Generation wRfx: NONREACTIVE

## 2022-09-05 MED ORDER — IRBESARTAN 300 MG PO TABS
300.0000 mg | ORAL_TABLET | Freq: Every day | ORAL | Status: DC
  Administered 2022-09-05 – 2022-09-09 (×5): 300 mg via ORAL
  Filled 2022-09-05 (×5): qty 1

## 2022-09-05 MED ORDER — LABETALOL HCL 5 MG/ML IV SOLN: 5.0000 mg | INTRAVENOUS | Status: DC | PRN

## 2022-09-05 MED ORDER — ACETAMINOPHEN 325 MG PO TABS
650.0000 mg | ORAL_TABLET | ORAL | Status: DC | PRN
  Administered 2022-09-05 – 2022-09-06 (×2): 650 mg via ORAL
  Filled 2022-09-05 (×3): qty 2

## 2022-09-05 MED ORDER — HYDROCHLOROTHIAZIDE 12.5 MG PO TABS
12.5000 mg | ORAL_TABLET | Freq: Every day | ORAL | Status: DC
  Administered 2022-09-05 – 2022-09-09 (×5): 12.5 mg via ORAL
  Filled 2022-09-05 (×5): qty 1

## 2022-09-05 MED ORDER — LORAZEPAM 2 MG/ML IJ SOLN: 2.0000 mg | INTRAMUSCULAR | Status: DC | PRN

## 2022-09-05 MED ORDER — ACETAMINOPHEN 650 MG RE SUPP: 650.0000 mg | RECTAL | Status: DC | PRN

## 2022-09-05 MED ORDER — ATORVASTATIN CALCIUM 40 MG PO TABS
40.0000 mg | ORAL_TABLET | Freq: Every day | ORAL | Status: DC
  Administered 2022-09-05 – 2022-09-09 (×5): 40 mg via ORAL
  Filled 2022-09-05 (×5): qty 1

## 2022-09-05 MED ORDER — ASPIRIN 81 MG PO CHEW
81.0000 mg | CHEWABLE_TABLET | Freq: Every day | ORAL | Status: DC
  Administered 2022-09-05 – 2022-09-09 (×5): 81 mg via ORAL
  Filled 2022-09-05 (×5): qty 1

## 2022-09-05 MED ORDER — ENOXAPARIN SODIUM 40 MG/0.4ML IJ SOSY
40.0000 mg | PREFILLED_SYRINGE | INTRAMUSCULAR | Status: DC
  Administered 2022-09-05 – 2022-09-08 (×4): 40 mg via SUBCUTANEOUS
  Filled 2022-09-05 (×4): qty 0.4

## 2022-09-05 MED ORDER — SODIUM CHLORIDE 0.9% FLUSH
3.0000 mL | Freq: Two times a day (BID) | INTRAVENOUS | Status: DC
  Administered 2022-09-05 – 2022-09-08 (×8): 3 mL via INTRAVENOUS

## 2022-09-05 NOTE — Progress Notes (Signed)
EMU  maint complete - no skin breakdown  Serviced P8 [facility] Monitored

## 2022-09-05 NOTE — Progress Notes (Signed)
vLTM EMU started  All impedances below 10kohms.  [facility] called to monitor.  Pt event button tested.

## 2022-09-05 NOTE — H&P (Addendum)
CC: seizure  History is obtained from: patient, chart review  HPI: Sharon Chan is a 46 y.o. female with medical history of hypertension, hyperlipidemia, prior stroke, PCOS who is admitted to epilepsy monitoring unit for characterization of seizure-like episodes.  Spell history: Patient states she had a stroke in 2018.  About 3 months after the stroke, patient started noticing episodes of staring off, left upper extremity twitching happening couple of times a week.  She was started on Topamax and eventually Depakote was added.  However she continues to have about 2 episodes a month, last episode was about 2 weeks ago.  States at times she has aura described as flashing light in both eyes, dizziness and ringing in both ears as well as a rising epigastric sensation before these episodes.  Still she has had tongue bite at the tip of her tongue, bladder incontinence with these episodes in the past.  Denies any side effects on current dose of medication.  Current AEDs: Topamax 100 mg every morning and 200 mg nightly, Depakote 500 mg nightly  Epilepsy risk factors: Reports patient's history of staring seizures: Reports normal birth and development, denies history of febrile convulsions, meningitis/encephalitis, head injury with loss of consciousness  ROS: All other systems reviewed and negative except as noted in the HPI.   Past Medical History:  Diagnosis Date   Anxiety    CVA (cerebral vascular accident) ([place])    Depression    DM type 2 (diabetes mellitus, type 2) ([place])    HLD (hyperlipidemia)    HTN (hypertension)    Joint pain    Migraines    Neck pain    Obese    Polycystic disease, ovaries    Right sided weakness    Seizures (HCC)    Shoulder pain    Sickle cell trait (HCC)    Thyroid nodule    Vitamin D deficiency 01/2020    Family History  Problem Relation Age of Onset   Heart attack Mother    Diabetes Mother    Hypertension Mother    Hyperlipidemia Mother     Post-traumatic stress disorder Father        committed suicide   Diabetes Father    Hypertension Sister    Breast cancer Maternal Grandmother    Breast cancer Paternal Grandmother    Thyroid cancer Paternal Grandmother     Social History:  reports that she has never smoked. She has never used smokeless tobacco. She reports that she does not drink alcohol and does not use drugs.   Facility-Administered Medications Prior to Admission  Medication Dose Route Frequency Provider Last Rate Last Admin   cloNIDine (CATAPRES) tablet 0.1 mg  0.1 mg Oral Once Fenton Foy, NP       Medications Prior to Admission  Medication Sig Dispense Refill Last Dose   acetaminophen (TYLENOL) 325 MG tablet Take 1-2 tablets (325-650 mg total) by mouth every 4 (four) hours as needed for mild pain.      albuterol (PROVENTIL HFA;VENTOLIN HFA) 108 (90 Base) MCG/ACT inhaler Inhale 2 puffs into the lungs every 6 (six) hours as needed for wheezing or shortness of breath. 1 Inhaler 11    aspirin EC 81 MG tablet Take 81 mg by mouth daily. Swallow whole.      atorvastatin (LIPITOR) 40 MG tablet Take 1 tablet (40 mg total) by mouth daily. 90 tablet 3    blood glucose meter kit and supplies KIT Dispense based on patient and insurance preference.  Use up to four times daily as directed. (FOR ICD-9 250.00, 250.01). 1 each 0    cyclobenzaprine (FLEXERIL) 5 MG tablet Take 1 tablet (5 mg total) by mouth 3 (three) times daily as needed for muscle spasms. 30 tablet 1    diphenhydrAMINE (BENADRYL) 25 MG tablet Take 25 mg by mouth every 6 (six) hours as needed.      divalproex (DEPAKOTE ER) 500 MG 24 hr tablet Take 1 tablet every night (Patient taking differently: Take 500 mg by mouth at bedtime.) 30 tablet 11    eszopiclone (LUNESTA) 1 MG TABS tablet Take 1 tablet (1 mg total) by mouth at bedtime as needed for sleep. Take immediately before bedtime 30 tablet 0    Insulin Pen Needle (BD PEN NEEDLE NANO U/F) 32G X 4 MM MISC Inject 1  Units into the skin 2 (two) times daily. 100 each 12    liraglutide (VICTOZA) 18 MG/3ML SOPN Inject 1.2 mg into the skin every morning. 3 mL 3    nortriptyline (PAMELOR) 25 MG capsule Take 2 capsules every night (Patient taking differently: Take 25-50 mg by mouth at bedtime. Take 2 capsules every night) 180 capsule 6    OneTouch Delica Lancets 39J MISC TES 2 (TWO) TIMES DAILY. 100 each 5    topiramate (TOPAMAX) 100 MG tablet TAKE 1 TABLET IN MORNING AND 2 TABLETS AT NIGHT (Patient taking differently: Take 100-200 mg by mouth See admin instructions. 100 mg in the morning, 200 mg at bedtime.) 90 tablet 6    valsartan-hydrochlorothiazide (DIOVAN-HCT) 320-12.5 MG tablet Take 1 tablet by mouth daily. 90 tablet 3    Vitamin D, Ergocalciferol, (DRISDOL) 1.25 MG (50000 UNIT) CAPS capsule Take 1 capsule (50,000 Units total) by mouth every 7 (seven) days. 4 capsule 6       Exam: Current vital signs: BP (!) 142/103 (BP Location: Left Arm)   Pulse 86   Temp 98 F (36.7 C) (Oral)   Resp 20   SpO2 99%  Vital signs in last 24 hours: Temp:  [98 F (36.7 C)] 98 F (36.7 C) (10/23 0925) Pulse Rate:  [86] 86 (10/23 0925) Resp:  [20] 20 (10/23 0925) BP: (142)/(103) 142/103 (10/23 0925) SpO2:  [99 %] 99 % (10/23 0925)   Physical Exam  Constitutional: Appears well-developed and well-nourished.  Psych: Affect appropriate to situation Eyes: No scleral injection Neuro: AOx3, cranial nerves II to XII grossly intact, 5/5 in left upper and left lower extremity, 3/5 in right upper and right lower extremity, decreased sensation to light touch in right upper and lower extremity, FTN intact bilaterally  I have reviewed labs in epic and the results pertinent to this consultation are: CBC:  Recent Labs  Lab 09/02/22 1041  WBC 5.9  HGB 12.7  HCT 39.9  MCV 78*  PLT 673    Basic Metabolic Panel:  Lab Results  Component Value Date   NA 142 09/02/2022   K 4.0 09/02/2022   CO2 22 09/02/2022   GLUCOSE 102  (H) 09/02/2022   BUN 8 09/02/2022   CREATININE 0.86 09/02/2022   CALCIUM 9.1 09/02/2022   GFRNONAA 72 02/03/2020   GFRAA 83 02/03/2020   Lipid Panel:  Lab Results  Component Value Date   LDLCALC 138 (H) 03/02/2022   HgbA1c:  Lab Results  Component Value Date   HGBA1C 6.3 (A) 06/02/2022   HGBA1C 6.3 06/02/2022   HGBA1C 6.3 06/02/2022   HGBA1C 6.3 06/02/2022   Urine Drug Screen:  Component Value Date/Time   LABOPIA POSITIVE (A) 02/26/2017 0147   COCAINSCRNUR NONE DETECTED 02/26/2017 0147   LABBENZ NONE DETECTED 02/26/2017 0147   AMPHETMU NONE DETECTED 02/26/2017 0147   THCU NONE DETECTED 02/26/2017 0147   LABBARB NONE DETECTED 02/26/2017 0147    Alcohol Level No results found for: "ETH"   I have reviewed the images obtained: MRI Brain w and wo contrast 04/21/2022: No acute intracranial abnormality. Encephalomalacia in the anterior right temporal lobe is new compared to 02/25/2017 and could indicate an old infarct that has occurred since then.      ASSESSMENT/PLAN: 46 year old female with history of right temporal stroke now with seizure-like activity.  Convulsion -We will obtain video EEG for characterization of spells -Hold Depakote and Topamax -As needed IV Ativan 2 mg for clinical seizure-like activity - Seizure precautions  History of stroke -Continue aspirin and atorvastatin  Hypertension -Continue home antihypertensives (replaced with formulary alternatives)  Chronic migraine -Continue nortriptyline  PCOS -Continue home medications  Vitamin D deficiency -Continue home vitamin D supplementation   [facility]

## 2022-09-05 NOTE — Progress Notes (Signed)
Pt admitted to 3W14 EMU bed at this time.  Alert and oriented x 4.  Denies any pain.  Telemetry placed on patient, CCMD called and second verification complete.  Seizure pads in place, 4 rails up, mats on floor and pt verbalizes understanding to call before attempting to get out of bed.

## 2022-09-06 ENCOUNTER — Encounter (HOSPITAL_COMMUNITY): Payer: Self-pay | Admitting: Neurology

## 2022-09-06 ENCOUNTER — Telehealth: Payer: Self-pay | Admitting: Pharmacist

## 2022-09-06 ENCOUNTER — Other Ambulatory Visit: Payer: Medicare Other | Admitting: Pharmacist

## 2022-09-06 DIAGNOSIS — R569 Unspecified convulsions: Secondary | ICD-10-CM | POA: Diagnosis not present

## 2022-09-06 LAB — TOPIRAMATE LEVEL: Topiramate Lvl: <1.5 ug/mL — ABNORMAL LOW (ref 2.0–25.0)

## 2022-09-06 MED ORDER — ALBUTEROL SULFATE (2.5 MG/3ML) 0.083% IN NEBU: 3.0000 mL | INHALATION_SOLUTION | Freq: Four times a day (QID) | RESPIRATORY_TRACT | Status: DC | PRN

## 2022-09-06 MED ORDER — CYCLOBENZAPRINE HCL 10 MG PO TABS: 5.0000 mg | ORAL_TABLET | Freq: Three times a day (TID) | ORAL | Status: DC | PRN

## 2022-09-06 MED ORDER — NORTRIPTYLINE HCL 10 MG PO CAPS
50.0000 mg | ORAL_CAPSULE | Freq: Every day | ORAL | Status: DC
  Administered 2022-09-07 – 2022-09-08 (×2): 50 mg via ORAL
  Filled 2022-09-06 (×3): qty 5

## 2022-09-06 MED ORDER — KETOROLAC TROMETHAMINE 15 MG/ML IJ SOLN
15.0000 mg | Freq: Three times a day (TID) | INTRAMUSCULAR | Status: DC | PRN
  Administered 2022-09-06 – 2022-09-07 (×3): 15 mg via INTRAVENOUS
  Filled 2022-09-06 (×3): qty 1

## 2022-09-06 MED ORDER — DIPHENHYDRAMINE HCL 25 MG PO CAPS: 25.0000 mg | ORAL_CAPSULE | Freq: Four times a day (QID) | ORAL | Status: DC | PRN

## 2022-09-06 MED ORDER — LIRAGLUTIDE 18 MG/3ML ~~LOC~~ SOPN: 0.6000 mg | PEN_INJECTOR | SUBCUTANEOUS | Status: DC

## 2022-09-06 MED ORDER — NORTRIPTYLINE HCL 25 MG PO CAPS: 75.0000 mg | ORAL_CAPSULE | Freq: Every day | ORAL | Status: DC

## 2022-09-06 NOTE — Progress Notes (Signed)
Pt had a fairly quiet night, c/o of severe burning headache, tylenol 650mg  given twice at 2019 and 0412 respectively with little or no relief, however, no seizure like activity observed, was however reassured and will continue to monitor. Sharon Chan, Sharon Chan

## 2022-09-06 NOTE — Progress Notes (Signed)
Subjective: Had 1 episode yesterday evening with left arm tremor without concomitant EEG change.  No further episodes overnight.  Reporting significant headache this morning.  Denies any other concerns.  ROS: negative except above  Examination  Vital signs in last 24 hours: Temp:  [97.6 F (36.4 C)-98.9 F (37.2 C)] 98.9 F (37.2 C) (10/24 0926) Pulse Rate:  [64-76] 64 (10/24 0926) Resp:  [13-18] 14 (10/24 0926) BP: (127-155)/(81-97) 155/95 (10/24 0926) SpO2:  [98 %-99 %] 98 % (10/24 0409) Weight:  [100.4 kg] 100.4 kg (10/23 1646)  General: lying in bed, NAD CVS: pulse-normal rate and rhythm RS: breathing comfortably Extremities: normal  Neuro:AOx3, cranial nerves II to XII grossly intact, 5/5 in left upper and left lower extremity, 3/5 in right upper and right lower extremity, decreased sensation to light touch in right upper and lower extremity, FTN intact bilaterally  Basic Metabolic Panel: Recent Labs  Lab 09/02/22 1041 09/05/22 1029  NA 142 139  K 4.0 3.2*  CL 107* 109  CO2 22 23  GLUCOSE 102* 120*  BUN 8 10  CREATININE 0.86 0.81  CALCIUM 9.1 8.7*  MG  --  2.0  PHOS  --  2.3*    CBC: Recent Labs  Lab 09/02/22 1041 09/05/22 1029  WBC 5.9 5.7  NEUTROABS  --  2.9  HGB 12.7 11.9*  HCT 39.9 35.3*  MCV 78* 74.2*  PLT 302 283     Coagulation Studies: Recent Labs    09/05/22 1029  LABPROT 13.8  INR 1.1    Imaging No new brain imaging overnight  ASSESSMENT AND PLAN: 46 year old female with history of right temporal stroke now with seizure-like activity.   Convulsion -Continue video EEG for characterization of spells -Continue to hold Depakote and Topamax -Hyperventilation, photic stimulation and sleep deprivation for seizure provocation today -Discussed overnight EEG findings and plan for the day with patient as well as RN -As needed IV Ativan 2 mg for clinical seizure-like activity - Seizure precautions   History of stroke -Continue aspirin and  atorvastatin   Hypertension -Continue home antihypertensives (replaced with formulary alternatives)   Chronic migraine -Continue nortriptyline -As needed Toradol   PCOS -Continue home medications   Vitamin D deficiency -Continue home vitamin D supplementation  I have spent a total of 35 minutes with the patient reviewing hospital notes,  test results, labs and examining the patient as well as establishing an assessment and plan that was discussed personally with the patient.  > 50% of time was spent in direct patient care.    [facility] For questions after 5pm please refer to AMION to reach the Neurologist on call

## 2022-09-06 NOTE — Progress Notes (Signed)
EMU maint complete - no skin breakdown Fp1 Fp2 Servied Fp1 P8 [facility] monitoring

## 2022-09-06 NOTE — Procedures (Signed)
Patient Name: Sharon Chan  MRN: [id number]  Epilepsy Attending: Lora Havens  Referring Physician/Provider: Lora Havens, MD Duration: 09/05/2022 1008 to 09/06/2022 1008  Patient history: 46 year old female with history of right temporal stroke now with seizure-like activity.  EEG to evaluate for seizures.   Level of alertness: Awake, asleep  AEDs during EEG study: None  Technical aspects: This EEG study was done with scalp electrodes positioned according to the 10-20 International system of electrode placement. Electrical activity was reviewed with band pass filter of 1-70Hz , sensitivity of 7 uV/mm, display speed of 21mm/sec with a 60Hz  notched filter applied as appropriate. EEG data were recorded continuously and digitally stored.  Video monitoring was available and reviewed as appropriate.  Description: The posterior dominant rhythm consists of 8-9 Hz activity of moderate voltage (25-35 uV) seen predominantly in posterior head regions, symmetric and reactive to eye opening and eye closing. Sleep was characterized by vertex waves, sleep spindles (12 to 14 Hz), maximal frontocentral region.   On 09/05/2022 at 1748, patient was noted to be sitting in bed with left hand tremor. She appeared to be staring off but did have frequent eye blink. Concomitant EEG before, during and after the event did not show any EEG changes to suggest seizure.  Event ended at 1755  Hyperventilation and photic stimulation were not performed.     IMPRESSION: This study is within normal limits. No seizures or epileptiform discharges were seen throughout the recording.  On 09/05/2022 at 1748, patient was noted to be sitting in bed with left hand tremor. She appeared to be staring off but did have frequent eye blink. Concomitant EEG did not show any EEG changes to suggest seizure.  This was most likely not an epileptic event.  Natilee Gauer Barbra Sarks

## 2022-09-06 NOTE — Progress Notes (Signed)
  Transition of Care [facility]) Screening Note   Patient Details  Name: Sharon Chan Date of Birth: 1976/01/26   Transition of Care [facility]) CM/SW Contact:    Pollie Friar, RN Phone Number: 09/06/2022, 1:41 PM    Transition of Care Department [facility]) has reviewed patient and no TOC needs have been identified at this time. We will continue to monitor patient advancement through interdisciplinary progression rounds. If new patient transition needs arise, please place a TOC consult.

## 2022-09-06 NOTE — Progress Notes (Signed)
Patient currently admitted. Will outreach to reschedule after discharge.   Sharon Chan, PharmD, [facility] [phone]

## 2022-09-07 DIAGNOSIS — R569 Unspecified convulsions: Secondary | ICD-10-CM | POA: Diagnosis not present

## 2022-09-07 MED ORDER — POTASSIUM CHLORIDE 20 MEQ PO PACK
40.0000 meq | PACK | Freq: Once | ORAL | Status: AC
  Administered 2022-09-07: 40 meq via ORAL
  Filled 2022-09-07: qty 2

## 2022-09-07 MED ORDER — KETOROLAC TROMETHAMINE 15 MG/ML IJ SOLN
30.0000 mg | Freq: Three times a day (TID) | INTRAMUSCULAR | Status: DC | PRN
  Administered 2022-09-07 – 2022-09-09 (×4): 30 mg via INTRAVENOUS
  Filled 2022-09-07 (×4): qty 2

## 2022-09-07 NOTE — Progress Notes (Signed)
EMU  LTM maint complete - no skin breakdown under: Fp2, bumps around Fp1, lead moved and placed in range. [facility] monitored, confirmed.

## 2022-09-07 NOTE — Progress Notes (Signed)
Pt still awake at this time, no seizure activity observed although still c/o of her persistent headache, had Toradol 15mg  at 2019 which she acknowledged some relief thereafter, was however reassured, v/s vstable. Obasogie-Asidi, Adrian Dinovo Efe

## 2022-09-07 NOTE — Procedures (Signed)
Patient Name: Sharon Chan  MRN: [id number]  Epilepsy Attending: Lora Havens  Referring Physician/Provider: Lora Havens, MD Duration: 09/06/2022 1008 to 09/07/2022 1008   Patient history: 46 year old female with history of right temporal stroke now with seizure-like activity.  EEG to evaluate for seizures.    Level of alertness: Awake, asleep   AEDs during EEG study: None   Technical aspects: This EEG study was done with scalp electrodes positioned according to the 10-20 International system of electrode placement. Electrical activity was reviewed with band pass filter of 1-70Hz , sensitivity of 7 uV/mm, display speed of 83mm/sec with a 60Hz  notched filter applied as appropriate. EEG data were recorded continuously and digitally stored.  Video monitoring was available and reviewed as appropriate.   Description: The posterior dominant rhythm consists of 8-9 Hz activity of moderate voltage (25-35 uV) seen predominantly in posterior head regions, symmetric and reactive to eye opening and eye closing. Sleep was characterized by vertex waves, sleep spindles (12 to 14 Hz), maximal frontocentral region.   On 09/06/2022 at 1336 during photic stimulation, patient was noted to be sitting in bed with left hand tremor intermittently. Concomitant EEG before, during and after the event did not show any EEG changes to suggest seizure.    Physiologic photic driving was seen during photic stimulation. No eeg change was seen during hyperventilation.   IMPRESSION: This study is within normal limits. No seizures or epileptiform discharges were seen throughout the recording.   On 09/06/2022 at 1336 during photic stimulation, patient was noted to be sitting in bed with left hand tremor intermittently. Concomitant EEG did not show any EEG changes to suggest seizure. This was most likely not an epileptic event.   [name] Barbra Sarks

## 2022-09-07 NOTE — Care Management Important Message (Signed)
Important Message  Patient Details  Name: Sharon Chan MRN: [id number] Date of Birth: 29-Jan-1976   Medicare Important Message Given:  Yes     Kharson Rasmusson 09/07/2022, 3:13 PM

## 2022-09-07 NOTE — Progress Notes (Signed)
vLTM maintenance  All impedances below 10kohms.  Patient event button tested with [facility].  No skin breakdown noted at [place]  FP2  F7  F8

## 2022-09-07 NOTE — Progress Notes (Signed)
Subjective: Had 1 event of left upper extremity tremor-like movement today.  Patient yesterday without EEG change.  Patient states since her stroke she feels like "there are multiple tabs open in her brain" and has been struggling with memory impairment.  States Toradol helped with headache but did not completely take it away.  ROS: negative except above Examination  Vital signs in last 24 hours: Temp:  [97.7 F (36.5 C)-98.7 F (37.1 C)] 98.3 F (36.8 C) (10/25 1113) Pulse Rate:  [60-88] 88 (10/25 1113) Resp:  [12-19] 19 (10/25 1113) BP: (142-154)/(86-106) 152/96 (10/25 1113) SpO2:  [97 %-100 %] 97 % (10/25 1113)  General: lying in bed, NAD CVS: pulse-normal rate and rhythm RS: breathing comfortably Extremities: normal  Neuro:AOx3, cranial nerves II to XII grossly intact, 5/5 in left upper and left lower extremity, 3/5 in right upper and right lower extremity, decreased sensation to light touch in right upper and lower extremity, FTN intact bilaterally  Basic Metabolic Panel: Recent Labs  Lab 09/02/22 1041 09/05/22 1029  NA 142 139  K 4.0 3.2*  CL 107* 109  CO2 22 23  GLUCOSE 102* 120*  BUN 8 10  CREATININE 0.86 0.81  CALCIUM 9.1 8.7*  MG  --  2.0  PHOS  --  2.3*    CBC: Recent Labs  Lab 09/02/22 1041 09/05/22 1029  WBC 5.9 5.7  NEUTROABS  --  2.9  HGB 12.7 11.9*  HCT 39.9 35.3*  MCV 78* 74.2*  PLT 302 283     Coagulation Studies: Recent Labs    09/05/22 1029  LABPROT 13.8  INR 1.1    Imaging No new brain imaging overnight   ASSESSMENT AND PLAN: 46 year old female with history of right temporal stroke now with seizure-like activity.   Convulsion -Continue video EEG for characterization of spells -Continue to hold Depakote and Topamax -Discussed overnight EEG findings and plan for the day with patient  -As needed IV Ativan 2 mg for clinical seizure-like activity - Seizure precautions   History of stroke -Continue aspirin and atorvastatin    Hypertension -Continue home antihypertensives (replaced with formulary alternatives)   Chronic migraine -Continue nortriptyline -IV Toradol increased dose from 15 mg to 30 mg as needed  Hypokalemia -Will replace with p.o. supplementation   PCOS -Continue home medications   Vitamin D deficiency -Continue home vitamin D supplementation   I have spent a total of 35 minutes with the patient reviewing hospital notes,  test results, labs and examining the patient as well as establishing an assessment and plan that was discussed personally with the patient.  > 50% of time was spent in direct patient care.  Zeb Comfort Epilepsy [facility] For questions after 5pm please refer to AMION to reach the Neurologist on call

## 2022-09-08 MED ORDER — TOPIRAMATE 100 MG PO TABS
200.0000 mg | ORAL_TABLET | Freq: Every day | ORAL | Status: DC
  Administered 2022-09-08: 200 mg via ORAL
  Filled 2022-09-08: qty 2

## 2022-09-08 MED ORDER — TOPIRAMATE 100 MG PO TABS
100.0000 mg | ORAL_TABLET | Freq: Every day | ORAL | Status: DC
  Administered 2022-09-09: 100 mg via ORAL
  Filled 2022-09-08: qty 1

## 2022-09-08 MED ORDER — DIVALPROEX SODIUM ER 500 MG PO TB24
500.0000 mg | ORAL_TABLET | Freq: Every day | ORAL | Status: DC
  Administered 2022-09-08: 500 mg via ORAL
  Filled 2022-09-08 (×2): qty 1

## 2022-09-08 NOTE — Progress Notes (Addendum)
Subjective: No acute events overnight.  ROS: negative except above  Examination  Vital signs in last 24 hours: Temp:  [97.7 F (36.5 C)-98.6 F (37 C)] 97.7 F (36.5 C) (10/26 0724) Pulse Rate:  [74-88] 87 (10/26 0724) Resp:  [16-19] 17 (10/26 0724) BP: (128-152)/(89-106) 144/106 (10/26 0724) SpO2:  [96 %-100 %] 100 % (10/26 0724)  General: lying in bed, NAD CVS: pulse-normal rate and rhythm RS: breathing comfortably Extremities: normal  Neuro:AOx3, cranial nerves II to XII grossly intact, 5/5 in left upper and left lower extremity, 3/5 in right upper and right lower extremity, decreased sensation to light touch in right upper and lower extremity, FTN intact bilaterally  Basic Metabolic Panel: Recent Labs  Lab 09/02/22 1041 09/05/22 1029  NA 142 139  K 4.0 3.2*  CL 107* 109  CO2 22 23  GLUCOSE 102* 120*  BUN 8 10  CREATININE 0.86 0.81  CALCIUM 9.1 8.7*  MG  --  2.0  PHOS  --  2.3*    CBC: Recent Labs  Lab 09/02/22 1041 09/05/22 1029  WBC 5.9 5.7  NEUTROABS  --  2.9  HGB 12.7 11.9*  HCT 39.9 35.3*  MCV 78* 74.2*  PLT 302 283     Coagulation Studies: Recent Labs    09/05/22 1029  LABPROT 13.8  INR 1.1    Imaging No new brain imaging overnight   ASSESSMENT AND PLAN: 46 year old female with history of right temporal stroke now with seizure-like activity.   Convulsion -Continue video EEG for characterization of spells -Continue to hold Depakote and Topamax, will resume tonight if no events with plan to discharge patient tomorrow -Discussed overnight EEG findings and plan for the day with patient  -As needed IV Ativan 2 mg for clinical seizure-like activity - Seizure precautions   History of stroke -Continue aspirin and atorvastatin   Hypertension -Continue home antihypertensives (replaced with formulary alternatives)   Chronic migraine -Continue nortriptyline -PRN toradol   PCOS -Continue home medications   Vitamin D  deficiency -Continue home vitamin D supplementation   I have spent a total of 26 minutes with the patient reviewing hospital notes,  test results, labs and examining the patient as well as establishing an assessment and plan that was discussed personally with the patient.  > 50% of time was spent in direct patient care.   Zeb Comfort Epilepsy [facility] For questions after 5pm please refer to AMION to reach the Neurologist on call

## 2022-09-08 NOTE — Procedures (Signed)
Patient Name: Sharon Chan  MRN: [id number]  Epilepsy Attending: Lora Havens  Referring Physician/Provider: Lora Havens, MD Duration: 09/07/2022 1008 to 09/08/2022 1008   Patient history: 46 year old female with history of right temporal stroke now with seizure-like activity.  EEG to evaluate for seizures.    Level of alertness: Awake, asleep   AEDs during EEG study: None   Technical aspects: This EEG study was done with scalp electrodes positioned according to the 10-20 International system of electrode placement. Electrical activity was reviewed with band pass filter of 1-70Hz , sensitivity of 7 uV/mm, display speed of 56mm/sec with a 60Hz  notched filter applied as appropriate. EEG data were recorded continuously and digitally stored.  Video monitoring was available and reviewed as appropriate.   Description: The posterior dominant rhythm consists of 8-9 Hz activity of moderate voltage (25-35 uV) seen predominantly in posterior head regions, symmetric and reactive to eye opening and eye closing. Sleep was characterized by vertex waves, sleep spindles (12 to 14 Hz), maximal frontocentral region.    IMPRESSION: This study is within normal limits. No seizures or epileptiform discharges were seen throughout the recording.  Skylar Flynt Barbra Sarks

## 2022-09-09 NOTE — Discharge Summary (Signed)
Physician Discharge Summary  Patient ID: Sharon Chan MRN: [id number] DOB/AGE: May 12, 1976 46 y.o.  Admit date: 09/05/2022 Discharge date: 09/09/2022  Admission Diagnoses: Convulsion  Discharge Diagnoses: Psychogenic nonepileptic spells  Discharged Condition: stable  Hospital Course: Ms. Sippel was admitted to epilepsy monitoring unit from 09/05/2022 to 09/09/2022.  During this time, she underwent continuous video EEG monitoring. Her AEDs were held, photic stimulation, hyperventilation and sleep deprivation were performed for seizure provocation.  2 typical events of left hand tremor and decreased responsiveness were recorded without concomitant EEG change.  These events were nonepileptic.  Rest of her video EEG was within normal limits.  Diagnosis of nonepileptic events was discussed with patient.  Her home AEDs were resumed.  Cognitive eval therapy was recommended.  Seizure precautions were discussed  Consults: None  Significant Diagnostic Studies:   Description: The posterior dominant rhythm consists of 8-9 Hz activity of moderate voltage (25-35 uV) seen predominantly in posterior head regions, symmetric and reactive to eye opening and eye closing. Sleep was characterized by vertex waves, sleep spindles (12 to 14 Hz), maximal frontocentral region.    On 09/05/2022 at 1748, patient was noted to be sitting in bed with left hand tremor. She appeared to be staring off but did have frequent eye blink. Concomitant EEG before, during and after the event did not show any EEG changes to suggest seizure.  Event ended at 1755  On 09/06/2022 at 1336 during photic stimulation, patient was noted to be sitting in bed with left hand tremor intermittently. Concomitant EEG before, during and after the event did not show any EEG changes to suggest seizure.    Physiologic photic driving was seen during photic stimulation. No eeg change was seen during hyperventilation.   IMPRESSION: This study  is within normal limits. No seizures or epileptiform discharges were seen throughout the recording.  On 09/05/2022 at 1748, patient was noted to be sitting in bed with left hand tremor. She appeared to be staring off but did have frequent eye blink. Concomitant EEG did not show any EEG changes to suggest seizure.  This was most likely not an epileptic event.   On 09/06/2022 at 1336 during photic stimulation, patient was noted to be sitting in bed with left hand tremor intermittently.  Concomitant EEG did not show any EEG changes to suggest seizure. This was most likely not an epileptic event.   Elecia Serafin Barbra Sarks   Treatments: Continue Depakote ER 500 mg at bedtime, topiramate 100 mg every morning and 200 mg nightly  Discharge Exam: Blood pressure (!) 146/96, pulse 81, temperature 97.9 F (36.6 C), temperature source Oral, resp. rate 16, height _0  (1.6 m), weight 100.4 kg, SpO2 100 %.  General: lying in bed, NAD CVS: pulse-normal rate and rhythm RS: breathing comfortably Extremities: normal  Neuro:AOx3, cranial nerves II to XII grossly intact, 5/5 in left upper and left lower extremity, 3/5 in right upper and right lower extremity, decreased sensation to light touch in right upper and lower extremity, FTN intact bilaterally  Disposition: Discharge disposition: 01-Home or Self Care   Discharge Instructions     Call MD for:   Complete by: As directed    If patient has another seizure, call 911 and bring them back to the ED if: A.  The seizure lasts longer than 5 minutes.      B.  The patient doesn't wake shortly after the seizure or has new problems such as difficulty seeing, speaking or moving following the seizure C.  The patient was injured during the seizure D.  The patient has a temperature over 102 F (39C) E.  The patient vomited during the seizure and now is having trouble breathing      Diet - low sodium heart healthy   Complete by: As directed    Driving Restrictions    Complete by: As directed    Per [facility] statutes, patients with seizures are not allowed to drive until they have been seizure-free for six months and cleared by a physician      Increase activity slowly   Complete by: As directed    Other Restrictions   Complete by: As directed    Use caution when using heavy equipment or power tools. Avoid working on ladders or at heights. Take showers instead of baths. Ensure the water temperature is not too high on the home water heater. Do not go swimming alone. Do not lock yourself in a room alone (i.e. bathroom). When caring for infants or small children, sit down when holding, feeding, or changing them to minimize risk of injury to the child in the event you have a seizure. Maintain good sleep hygiene. Avoid alcohol.         Allergies as of 09/09/2022       Reactions   Penicillins Anaphylaxis   Other Itching, Rash, Other (See Comments)   Strong sunlight - polymorphous light eruption        Medication List     TAKE these medications    albuterol 108 (90 Base) MCG/ACT inhaler Commonly known as: VENTOLIN HFA Inhale 2 puffs into the lungs every 6 (six) hours as needed for wheezing or shortness of breath.   aspirin EC 81 MG tablet Take 81 mg by mouth in the morning.   atorvastatin 40 MG tablet Commonly known as: LIPITOR Take 1 tablet (40 mg total) by mouth daily.   BD Pen Needle Nano U/F 32G X 4 MM Misc Generic drug: Insulin Pen Needle Inject 1 Units into the skin 2 (two) times daily.   blood glucose meter kit and supplies Kit Dispense based on patient and insurance preference. Use up to four times daily as directed. (FOR ICD-9 250.00, 250.01).   cyclobenzaprine 5 MG tablet Commonly known as: FLEXERIL Take 1 tablet (5 mg total) by mouth 3 (three) times daily as needed for muscle spasms.   diphenhydrAMINE 25 MG tablet Commonly known as: BENADRYL Take 25 mg by mouth every 6 (six) hours as needed for allergies.    divalproex 500 MG 24 hr tablet Commonly known as: Depakote ER Take 1 tablet every night What changed:  how much to take how to take this when to take this additional instructions   eszopiclone 1 MG Tabs tablet Commonly known as: LUNESTA Take 1 tablet (1 mg total) by mouth at bedtime as needed for sleep. Take immediately before bedtime   nortriptyline 25 MG capsule Commonly known as: PAMELOR Take 2 capsules every night What changed:  how much to take how to take this when to take this   OneTouch Delica Lancets 01B Misc TES 2 (TWO) TIMES DAILY.   topiramate 100 MG tablet Commonly known as: TOPAMAX TAKE 1 TABLET IN MORNING AND 2 TABLETS AT NIGHT What changed:  how much to take how to take this when to take this additional instructions   valsartan-hydrochlorothiazide 320-12.5 MG tablet Commonly known as: DIOVAN-HCT Take 1 tablet by mouth daily. What changed: when to take this   Victoza 18 MG/3ML  Sopn Generic drug: liraglutide Inject 1.2 mg into the skin every morning. What changed:  how much to take when to take this       Seizure precautions: Per [facility] statutes, patients with seizures are not allowed to drive until they have been seizure-free for six months and cleared by a physician    Use caution when using heavy equipment or power tools. Avoid working on ladders or at heights. Take showers instead of baths. Ensure the water temperature is not too high on the home water heater. Do not go swimming alone. Do not lock yourself in a room alone (i.e. bathroom). When caring for infants or small children, sit down when holding, feeding, or changing them to minimize risk of injury to the child in the event you have a seizure. Maintain good sleep hygiene. Avoid alcohol.    If patient has another seizure, call 911 and bring them back to the ED if: A.  The seizure lasts longer than 5 minutes.      B.  The patient doesn't wake shortly after the seizure or has  new problems such as difficulty seeing, speaking or moving following the seizure C.  The patient was injured during the seizure D.  The patient has a temperature over 102 F (39C) E.  The patient vomited during the seizure and now is having trouble breathing    During the Seizure   - First, ensure adequate ventilation and place patients on the floor on their left side  Loosen clothing around the neck and ensure the airway is patent. If the patient is clenching the teeth, do not force the mouth open with any object as this can cause severe damage - Remove all items from the surrounding that can be hazardous. The patient may be oblivious to what's happening and may not even know what he or she is doing. If the patient is confused and wandering, either gently guide him/her away and block access to outside areas - Reassure the individual and be comforting - Call 911. In most cases, the seizure ends before EMS arrives. However, there are cases when seizures may last over 3 to 5 minutes. Or the individual may have developed breathing difficulties or severe injuries. If a pregnant patient or a person with diabetes develops a seizure, it is prudent to call an ambulance. - Finally, if the patient does not regain full consciousness, then call EMS. Most patients will remain confused for about 45 to 90 minutes after a seizure, so you must use judgment in calling for help.    After the Seizure (Postictal Stage)   After a seizure, most patients experience confusion, fatigue, muscle pain and/or a headache. Thus, one should permit the individual to sleep. For the next few days, reassurance is essential. Being calm and helping reorient the person is also of importance.   Most seizures are painless and end spontaneously. Seizures are not harmful to others but can lead to complications such as stress on the lungs, brain and the heart. Individuals with prior lung problems may develop labored breathing and respiratory  distress.    Signed: Lora Havens 09/09/2022, 9:12 AM

## 2022-09-09 NOTE — Progress Notes (Addendum)
Patient ready for discharge to home; discharge instructions given and reviewed; no new Rx's; patient was waiting for ride from family member who is unable to come now; patient is obtaining an uber to transport home; all belongings returned; patient discharged out via wheelchair.

## 2022-09-09 NOTE — TOC Transition Note (Signed)
Transition of Care [facility]) - CM/SW Discharge Note   Patient Details  Name: Sharon Chan MRN: [id number] Date of Birth: 1976-10-14  Transition of Care [facility]) CM/SW Contact:  Pollie Friar, RN Phone Number: 09/09/2022, 9:44 AM   Clinical Narrative:    Pt is discharging home with self care. No needs per TOC.   Final next level of care: Home/Self Care Barriers to Discharge: No Barriers Identified   Patient Goals and CMS Choice        Discharge Placement                       Discharge Plan and Services                                     Social Determinants of Health (SDOH) Interventions     Readmission Risk Interventions     No data to display

## 2022-09-09 NOTE — Procedures (Addendum)
Patient Name: Sharon Chan  MRN: [id number]  Epilepsy Attending: Lora Havens  Referring Physician/Provider: Lora Havens, MD Duration: 09/08/2022 1008 to 09/09/2022 1036   Patient history: 46 year old female with history of right temporal stroke now with seizure-like activity.  EEG to evaluate for seizures.    Level of alertness: Awake, asleep   AEDs during EEG study: TPM, VPA   Technical aspects: This EEG study was done with scalp electrodes positioned according to the 10-20 International system of electrode placement. Electrical activity was reviewed with band pass filter of 1-70Hz , sensitivity of 7 uV/mm, display speed of 66mm/sec with a 60Hz  notched filter applied as appropriate. EEG data were recorded continuously and digitally stored.  Video monitoring was available and reviewed as appropriate.   Description: The posterior dominant rhythm consists of 8-9 Hz activity of moderate voltage (25-35 uV) seen predominantly in posterior head regions, symmetric and reactive to eye opening and eye closing. Sleep was characterized by vertex waves, sleep spindles (12 to 14 Hz), maximal frontocentral region.    IMPRESSION: This study is within normal limits. No seizures or epileptiform discharges were seen throughout the recording.   Margia Wiesen Barbra Sarks

## 2022-09-09 NOTE — Discharge Instructions (Addendum)
You were admitted to epilepsy monitoring unit from 09/05/2022 to 09/09/2022.  During this time, you underwent continuous video EEG monitoring, your antiseizure medications were held, photic stimulation and hyperventilation as well as sleep deprivation were performed to provoke seizures.  Two typical events were recorded due to which no EEG change was noted.  These events were nonepileptic.  We discussed the diagnosis of nonepileptic spells with you and recommended cognitive behavioral therapy.  Your home medications were resumed and continued at same dose.  This seizure precautions including do not drive were discussed.

## 2022-09-09 NOTE — Progress Notes (Signed)
Eeg discontinued, no skin breakdown at all skin sites.

## 2022-09-12 ENCOUNTER — Telehealth: Payer: Self-pay

## 2022-09-12 DIAGNOSIS — N182 Chronic kidney disease, stage 2 (mild): Secondary | ICD-10-CM | POA: Diagnosis not present

## 2022-09-12 NOTE — Patient Outreach (Signed)
  Care Coordination [facility] Note Transition Care Management Unsuccessful Follow-up Telephone Call  Date of discharge and from where:  [facility] 09[facility]22-09/09/22  Attempts:  1st Attempt  Reason for unsuccessful TCM follow-up call:  Unable to leave message  Johnney Killian, RN, BSN, CCM Care Management Coordinator [facility] Phone: [phone]: [phone]

## 2022-09-13 ENCOUNTER — Telehealth: Payer: Self-pay

## 2022-09-13 NOTE — Patient Outreach (Signed)
  Care Coordination [facility] Note Transition Care Management Unsuccessful Follow-up Telephone Call  Date of discharge and from [facility]:  [facility] 09/05/22-09/09/22  Attempts:  2nd Attempt  Reason for unsuccessful TCM follow-up call:  Unable to leave message  Johnney Killian, RN, BSN, CCM Care Management Coordinator [facility] Phone: [phone]: [phone]

## 2022-09-14 ENCOUNTER — Telehealth: Payer: Self-pay

## 2022-09-14 NOTE — Patient Outreach (Signed)
  Care Coordination TOC Note Transition Care Management Unsuccessful Follow-up Telephone Call  Date of discharge and from where:  [facility] 09/05/22-09/09/22  Attempts:  3rd Attempt  Reason for unsuccessful TCM follow-up call:  Unable to leave message

## 2022-09-20 ENCOUNTER — Other Ambulatory Visit: Payer: Self-pay | Admitting: Nurse Practitioner

## 2022-09-20 ENCOUNTER — Telehealth: Payer: Self-pay

## 2022-09-20 NOTE — Telephone Encounter (Signed)
Appt 10/11/2022 @3pm 

## 2022-09-20 NOTE — Progress Notes (Signed)
   Care Guide Note  09/20/2022 Name: Sharon Chan MRN: [id number] DOB: 21-Apr-1976  Referred by: Fenton Foy, NP Reason for referral : Care Coordination (Outreach to reschedule missed f/u with Pharm d )   Sharon Chan is a 46 y.o. year old female who is a primary care patient of Fenton Foy, NP. Sharon Chan was referred to the pharmacist for assistance related to DM.    An unsuccessful telephone outreach was attempted today to contact the patient who was referred to the pharmacy team for assistance with medication management. Additional attempts will be made to contact the patient.   Sharon Chan, [place], [place] [address] Direct Dial: [phone] [name]

## 2022-09-23 NOTE — Telephone Encounter (Signed)
[facility] is requesting to fill pt flexiril. Please advise [facility]

## 2022-09-23 NOTE — Telephone Encounter (Signed)
Albuterol

## 2022-09-27 ENCOUNTER — Encounter: Payer: Self-pay | Admitting: Neurology

## 2022-09-27 ENCOUNTER — Ambulatory Visit (INDEPENDENT_AMBULATORY_CARE_PROVIDER_SITE_OTHER): Payer: Medicare Other | Admitting: Neurology

## 2022-09-27 VITALS — BP 164/103 | HR 75 | Ht 63.0 in | Wt 228.0 lb

## 2022-09-27 DIAGNOSIS — F445 Conversion disorder with seizures or convulsions: Secondary | ICD-10-CM

## 2022-09-27 DIAGNOSIS — R404 Transient alteration of awareness: Secondary | ICD-10-CM

## 2022-09-27 DIAGNOSIS — R519 Headache, unspecified: Secondary | ICD-10-CM | POA: Diagnosis not present

## 2022-09-27 DIAGNOSIS — I639 Cerebral infarction, unspecified: Secondary | ICD-10-CM | POA: Diagnosis not present

## 2022-09-27 DIAGNOSIS — I69351 Hemiplegia and hemiparesis following cerebral infarction affecting right dominant side: Secondary | ICD-10-CM

## 2022-09-27 DIAGNOSIS — R569 Unspecified convulsions: Secondary | ICD-10-CM

## 2022-09-27 DIAGNOSIS — I6381 Other cerebral infarction due to occlusion or stenosis of small artery: Secondary | ICD-10-CM

## 2022-09-27 MED ORDER — NORTRIPTYLINE HCL 25 MG PO CAPS: ORAL_CAPSULE | ORAL | 3 refills | Status: DC

## 2022-09-27 MED ORDER — TOPIRAMATE 100 MG PO TABS: ORAL_TABLET | ORAL | 3 refills | Status: DC

## 2022-09-27 MED ORDER — DIVALPROEX SODIUM ER 500 MG PO TB24: ORAL_TABLET | ORAL | 3 refills | Status: DC
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
## 2022-09-30 ENCOUNTER — Ambulatory Visit (INDEPENDENT_AMBULATORY_CARE_PROVIDER_SITE_OTHER): Payer: Medicare Other | Admitting: Clinical

## 2022-09-30 DIAGNOSIS — F3289 Other specified depressive episodes: Secondary | ICD-10-CM

## 2022-10-03 NOTE — BH Specialist Note (Addendum)
Integrated Behavioral Health Follow Up In-Person Visit  MRN: [id number] Name: Sharon Chan  Number of Integrated Behavioral Health Clinician visits: 4- Fourth Visit  Session Start time: 1005   Session End time: 1105  Total time in minutes: 60   Types of Service: Individual psychotherapy  Interpretor:No. Interpretor Name and Language: none  Subjective: Sharon Chan is a 46 y.o. female accompanied by  self Patient was referred by Angus Seller, NP for depression. Patient reports the following symptoms/concerns: depression and anxiety related to health concerns Duration of problem: several years; Severity of problem: severe  Objective: Mood: Euthymic and Affect: Appropriate Risk of harm to self or others: No plan to harm self or others  Patient and/or Family's Strengths/Protective Factors: Social connections, Social and Emotional competence, Concrete supports in place (healthy food, safe environments, etc.), and Sense of purpose   Goals Addressed: Patient will:  Reduce symptoms of: anxiety and depression   Increase knowledge and/or ability of: coping skills and self-management skills   Demonstrate ability to: Increase healthy adjustment to current life circumstances  Progress towards Goals: Ongoing  Interventions: Interventions utilized:  Supportive Counseling Standardized Assessments completed: Not Needed  Patient has taken on a care giving role for her father figure, as he was recently hospitalized after a fall. Supportive counseling today to process thoughts and emotions related to this. Patient has a positive view of this role and reports improved mood from spending more time with him.  Patient and/or Family Response: Patient engaged in session.   Assessment: Patient currently experiencing depression and anxiety related to health conditions.   Patient may benefit from CBT to explore unhelpful beliefs about self and re-frame these beliefs to improve  mood and decrease anxiety.  Plan: Follow up with behavioral health clinician on: 10/28/22  Abigail Butts, LCSW

## 2022-10-11 ENCOUNTER — Other Ambulatory Visit: Payer: Medicare Other | Admitting: Pharmacist

## 2022-10-11 ENCOUNTER — Telehealth: Payer: Self-pay | Admitting: Pharmacist

## 2022-10-11 ENCOUNTER — Other Ambulatory Visit: Payer: Self-pay | Admitting: Neurology

## 2022-10-11 DIAGNOSIS — R519 Headache, unspecified: Secondary | ICD-10-CM

## 2022-10-11 MED ORDER — AMLODIPINE BESYLATE 5 MG PO TABS: 5.0000 mg | ORAL_TABLET | Freq: Every day | ORAL | 1 refills | Status: DC

## 2022-10-11 NOTE — Progress Notes (Signed)
10/11/2022 Name: Sharon Chan MRN: [id number] DOB: 23-Jul-1976  Chief Complaint  Patient presents with   Medication Management   Hypertension   Hyperlipidemia   Diabetes    Sharon Chan is a 46 y.o. year old female who presented for a telephone visit.   They were referred to the pharmacist by their PCP for assistance in managing hypertension.   Subjective:  Care Team: Primary Care Provider: Fenton Foy, NP ; Next Scheduled Visit: 03/03/23 Neurologist: Delice Lesch; Next Scheduled Visit: 04/17/22  Medication Access/Adherence  Current Pharmacy:  [id number] [place], [address]  [id number]: [phone]: [phone] [facility] [address]Phone: [phone]: [phone]  Patient reports affordability concerns with their medications: No  Patient reports access/transportation concerns to their pharmacy: No  Patient reports adherence concerns with their medications:  No    Reports she hasn't had a seizure since her hospitalization last month. Reports her pastor/close friend has been in inpatient rehab, so she's been spending a lot of time at the hospital visiting him.   Diabetes:  Current medications: Victoza 0.6 mg daily   Hypertension:  Current medications: valsartan/HCTZ 320/12.5 mg - reports she has adjusted to taking at night. Reports diuretic has not negatively impacted her sleep. Some swelling in her feet and ankles, but has improved over the past few dys.    Patient has a validated, automated, upper arm home BP cuff  Current blood pressure readings: reports she's asked nursing staff at the hospital to check her blood pressure and has had readings ~ 180/90s at the hospital for several days; tingling in arm but this happens periodically at baseline.   Reports a  constant headache at baseline, but some dizziness now. No chest pain, vision changes.    Hyperlipidemia/ASCVD Risk Reduction  Current lipid lowering medications: atorvastatin 40 mg daily  Antiplatelet regimen: aspirin 81 mg daily    Health Maintenance  Health Maintenance Due  Topic Date Due   Medicare Annual Wellness (AWV)  Never done   OPHTHALMOLOGY EXAM  Never done   Diabetic kidney evaluation - Urine ACR  Never done   Hepatitis C Screening  Never done   PAP SMEAR-Modifier  Never done   COLONOSCOPY (Pts 45-457yrInsurance coverage will need to be confirmed)  Never done   FOOT EXAM  01/02/2020   INFLUENZA VACCINE  06/14/2022   COVID-19 Vaccine (3 - 2023-24 season) 07/15/2022     Objective: Lab Results  Component Value Date   HGBA1C 6.3 (A) 06/02/2022   HGBA1C 6.3 06/02/2022   HGBA1C 6.3 06/02/2022   HGBA1C 6.3 06/02/2022    Lab Results  Component Value Date   CREATININE 0.81 09/05/2022   BUN 10 09/05/2022   NA 139 09/05/2022   K 3.2 (L) 09/05/2022   CL 109 09/05/2022   CO2 23 09/05/2022    Lab Results  Component Value Date   CHOL 199 03/02/2022   HDL 49 03/02/2022   LDLCALC 138 (H) 03/02/2022   TRIG 66 03/02/2022   CHOLHDL 4.1 03/02/2022    Medications Reviewed Today     Reviewed by AqCameron SprangMD (Physician) on 09/27/22 at 15Hol[facility] Islandist Status: <None>   Medication Order Taking? Sig Documenting Provider Last Dose Status Informant  albuterol (PROVENTIL HFA;VENTOLIN HFA) 108 (90 Base) MCG/ACT  inhaler [id number] Yes Inhale 2 puffs into the lungs every 6 (six) hours as needed for wheezing or shortness of breath. Azzie Glatter, FNP Taking Active Pharmacy Records, Self           Med Note Jodi Mourning, Tillie Fantasia Jul 07, 2022  2:10 PM)    aspirin EC 81 MG tablet [id number] Yes Take 81 mg by mouth in the morning. [provider] Taking Active Self  atorvastatin (LIPITOR) 40 MG tablet [id number] Yes Take 1 tablet (40 mg total) by mouth daily.  Fenton Foy, NP Taking Active Pharmacy Records, Self  blood glucose meter kit and supplies KIT [id number]  Dispense based on patient and insurance preference. Use up to four times daily as directed. (FOR ICD-9 250.00, 250.01). Starlyn Skeans, MD  Active Pharmacy Records, Self  cyclobenzaprine (FLEXERIL) 5 MG tablet [id number] Yes TAKE ONE TABLET BY MOUTH THREE TIMES DAILY AS NEEDED (VIAL) Fenton Foy, NP Taking Active   diphenhydrAMINE (BENADRYL) 25 MG tablet [id number] Yes Take 25 mg by mouth every 6 (six) hours as needed for allergies. [provider] Taking Active Pharmacy Records, Self           Med Note (COFFELL, Dennard Nip Sep 05, 2022  9:31 AM)    divalproex (DEPAKOTE ER) 500 MG 24 hr tablet [id number] Yes Take 1 tablet every night  Patient taking differently: Take 500 mg by mouth at bedtime.   Cameron Sprang, MD Taking Active Pharmacy Records, Self  eszopiclone Johnnye Sima) 1 MG TABS tablet [id number] Yes Take 1 tablet (1 mg total) by mouth at bedtime as needed for sleep. Take immediately before bedtime Cobb, Karie Schwalbe, NP Taking Active Pharmacy Records, Self  Insulin Pen Needle (BD PEN NEEDLE NANO U/F) 32G X 4 MM MISC [id number] Yes Inject 1 Units into the skin 2 (two) times daily. Azzie Glatter, FNP Taking Active Pharmacy Records, Self  liraglutide (VICTOZA) 18 MG/3ML SOPN [id number] Yes Inject 1.2 mg into the skin every morning.  Patient taking differently: Inject 0.6 mg into the skin in the morning.   Fenton Foy, NP Taking Active Pharmacy Records, Self           Med Note (COFFELL, Dionne Bucy   Mon Sep 05, 2022 10:08 AM)    nortriptyline (PAMELOR) 25 MG capsule [id number] Yes Take 2 capsules every night  Patient taking differently: Take 75 mg by mouth at bedtime. Take 2 capsules every night   Cameron Sprang, MD Taking Active Pharmacy Records, Self           Med Note (COFFELL, Dionne Bucy   Mon Sep 05, 2022  9:31 AM)    Jonetta Speak Lancets 48N MISC [id number]  Yes TES 2 (TWO) TIMES DAILY. Azzie Glatter, FNP Taking Active Pharmacy Records, Self  topiramate (TOPAMAX) 100 MG tablet [id number] Yes TAKE 1 TABLET IN MORNING AND 2 TABLETS AT NIGHT  Patient taking differently: Take 100-200 mg by mouth See admin instructions. 100 mg in the morning, 200 mg at bedtime.   Cameron Sprang, MD Taking Active Pharmacy Records, Self  valsartan-hydrochlorothiazide (DIOVAN-HCT) 320-12.5 MG tablet [id number] Yes Take 1 tablet by mouth daily.  Patient taking differently: Take 1 tablet by mouth in the morning.   Fenton Foy, NP Taking Active Pharmacy Records, Self              Assessment/Plan:   Hypertension: - Currently uncontrolled - PCP out of office today,  discussed with covering provider Creta Levin. Start amlodipine 5 mg daily. Counseled on potential of increased lower extremity edema, though dose dependent. Patient amenable. Patient scheduled for follow up at PCP office in 1 week. Discussed referral to advanced hypertension clinic - patient interested. Will discuss with PCP   Hyperlipidemia/ASCVD Risk Reduction: - Currently uncontrolled but anticipate improvement with adherence. - Recommend to continue current regimen; recheck lipids with next set of lab work now that patient is more consistently taking statin  Follow Up Plan: follow up in ~ 2 weeks  [name], PharmD, [place], [place] [phone]

## 2022-10-11 NOTE — Progress Notes (Signed)
Attempted to contact patient for scheduled appointment for medication management. Left HIPAA compliant message for patient to return my call at their convenience.   

## 2022-10-18 ENCOUNTER — Ambulatory Visit (INDEPENDENT_AMBULATORY_CARE_PROVIDER_SITE_OTHER): Payer: Medicare Other | Admitting: Nurse Practitioner

## 2022-10-18 ENCOUNTER — Encounter: Payer: Self-pay | Admitting: Nurse Practitioner

## 2022-10-18 VITALS — BP 126/88 | Temp 98.8°F | Ht 63.0 in | Wt 219.0 lb

## 2022-10-18 DIAGNOSIS — Z23 Encounter for immunization: Secondary | ICD-10-CM | POA: Diagnosis not present

## 2022-10-18 DIAGNOSIS — I10 Essential (primary) hypertension: Secondary | ICD-10-CM | POA: Diagnosis not present

## 2022-10-18 DIAGNOSIS — Z6838 Body mass index (BMI) 38.0-38.9, adult: Secondary | ICD-10-CM | POA: Diagnosis not present

## 2022-10-18 DIAGNOSIS — Z1211 Encounter for screening for malignant neoplasm of colon: Secondary | ICD-10-CM

## 2022-10-18 DIAGNOSIS — R600 Localized edema: Secondary | ICD-10-CM

## 2022-10-18 NOTE — Assessment & Plan Note (Addendum)
BP Readings from Last 3 Encounters:  10/18/22 126/88  09/27/22 (!) 164/103  09/09/22 (!) 146/96   Systolic BP is normal, diastolic BP is slightly elevated but much improved. Will continue current medications for now DASH diet advised patient encouraged to engage in regular moderate to vigorous exercises at least 150 minutes weekly. Appreciate collaboration with the pharmacist Follow up as planned with PCP.

## 2022-10-18 NOTE — Assessment & Plan Note (Signed)
Nonpitting edema to bilateral lower extremities. Patient encouraged to wear compression socks,keep legs elevated when sitting, avoid salty foods to help decrease swelling. Patient encouraged to lose weight

## 2022-10-18 NOTE — Assessment & Plan Note (Signed)
Body mass index is 38.79 kg/m.  Wt Readings from Last 3 Encounters:  10/18/22 219 lb (99.3 kg)  09/27/22 228 lb (103.4 kg)  09/05/22 221 lb 6.4 oz (100.4 kg)  States that she has been struggling with weight loss since she had her stroke, not been eating much but still gaining weight. Patient counseled on low-carb modified diet She was encouraged to engage in regular moderate to vigorous exercises at least 150 minutes weekly as tolerated. She is currently on Victoza which should also assist with weight loss.

## 2022-10-18 NOTE — Patient Instructions (Addendum)
Flu vaccine and TDAP vaccine today   It is important that you exercise regularly at least 30 minutes 5 times a week as tolerated  Think about what you will eat, plan ahead. Choose " clean, green, fresh or frozen" over canned, processed or packaged foods which are more sugary, salty and fatty. 70 to 75% of food eaten should be vegetables and fruit. Three meals at set times with snacks allowed between meals, but they must be fruit or vegetables. Aim to eat over a 12 hour period , example 7 am to 7 pm, and STOP after  your last meal of the day. Drink water,generally about 64 ounces per day, no other drink is as healthy. Fruit juice is best enjoyed in a healthy way, by EATING the fruit.  Thanks for choosing [facility] we consider it a privelige to serve you.

## 2022-10-18 NOTE — Progress Notes (Signed)
Established Patient Office Visit  Subjective:  Patient ID: Sharon Chan, female    DOB: [birth year]  Age: 46 y.o. MRN: [id number]  CC:  Chief Complaint  Patient presents with   Follow-up   Hypertension    Follow-up--add medication amlodipine w/ valsartan.     HPI ANAIRA Chan is a 46 y.o. female with past medical history of type 2 diabetes without complication, peripheral neuropathy, polycystic kidney disease, stroke, essential hypertension, sickle cell trait, B12 nutritional deficiency presents for follow-up for hypertension.   Hypertension.  Currently on amlodipine 5 mg daily, valsartan-hydrochlorothiazide 320 - 12.5 mg 1 tablet daily.  Amlodipine was started about 2 weeks ago due to uncontrolled HTN. Patient reports blood pressure readings has been around 118 over 80s at home.  She denies dizziness, chest pain, syncope. She reports chronic HA, some bilateral lower extremities edema  Due for flu vaccine flu vaccine was administered in the office today Due for colon cancer screening Cologuard test ordered.    Past Medical History:  Diagnosis Date   Anxiety    CVA (cerebral vascular accident) ([place])    Depression    DM type 2 (diabetes mellitus, type 2) ([name])    HLD (hyperlipidemia)    HTN (hypertension)    Joint pain    Migraines    Neck pain    Obese    Polycystic disease, ovaries    Right sided weakness    Seizures ([facility])    Shoulder pain    Sickle cell trait ([facility])    Thyroid nodule    Vitamin D deficiency 01/2020    Past Surgical History:  Procedure Laterality Date   LAPAROSCOPIC CHOLECYSTECTOMY      Family History  Problem Relation Age of Onset   Heart attack Mother    Diabetes Mother    Hypertension Mother    Hyperlipidemia Mother    Post-traumatic stress disorder Father        committed suicide   Diabetes Father    Hypertension Sister    Breast cancer Maternal Grandmother    Breast cancer Paternal Grandmother    Thyroid cancer Paternal  Grandmother     Social History   Socioeconomic History   Marital status: Single    Spouse name: Not on file   Number of children: 0   Years of education: Not on file   Highest education level: Not on file  Occupational History   Not on file  Tobacco Use   Smoking status: Never   Smokeless tobacco: Never  Vaping Use   Vaping Use: Never used  Substance and Sexual Activity   Alcohol use: No   Drug use: No   Sexual activity: Not Currently  Other Topics Concern   Not on file  Social History Narrative   Lives at home, mom currently staying with her   Left-handed   Caffeine: occasional decaf coffee or raspberry tea   One story home   Social Determinants of Health   Financial Resource Strain: Not on file  Food Insecurity: Not on file  Transportation Needs: Not on file  Physical Activity: Not on file  Stress: Not on file  Social Connections: Not on file  Intimate Partner Violence: Not on file    Outpatient Medications Prior to Visit  Medication Sig Dispense Refill   albuterol (PROVENTIL HFA;VENTOLIN HFA) 108 (90 Base) MCG/ACT inhaler Inhale 2 puffs into the lungs every 6 (six) hours as needed for wheezing or shortness of breath. 1 Inhaler 11  amLODipine (NORVASC) 5 MG tablet Take 1 tablet (5 mg total) by mouth daily. 90 tablet 1   aspirin EC 81 MG tablet Take 81 mg by mouth in the morning.     atorvastatin (LIPITOR) 40 MG tablet Take 1 tablet (40 mg total) by mouth daily. 90 tablet 3   blood glucose meter kit and supplies KIT Dispense based on patient and insurance preference. Use up to four times daily as directed. (FOR ICD-9 250.00, 250.01). 1 each 0   cyclobenzaprine (FLEXERIL) 5 MG tablet TAKE ONE TABLET BY MOUTH THREE TIMES DAILY AS NEEDED (VIAL) 30 tablet 11   diphenhydrAMINE (BENADRYL) 25 MG tablet Take 25 mg by mouth every 6 (six) hours as needed for allergies.     divalproex (DEPAKOTE ER) 500 MG 24 hr tablet Take 1 tablet twice a day 180 tablet 3   eszopiclone  (LUNESTA) 1 MG TABS tablet Take 1 tablet (1 mg total) by mouth at bedtime as needed for sleep. Take immediately before bedtime 30 tablet 0   Insulin Pen Needle (BD PEN NEEDLE NANO U/F) 32G X 4 MM MISC Inject 1 Units into the skin 2 (two) times daily. 100 each 12   liraglutide (VICTOZA) 18 MG/3ML SOPN Inject 1.2 mg into the skin every morning. (Patient taking differently: Inject 0.6 mg into the skin in the morning.) 3 mL 3   nortriptyline (PAMELOR) 25 MG capsule Take 3 capsules every night 270 capsule 3   OneTouch Delica Lancets 16X MISC TES 2 (TWO) TIMES DAILY. 100 each 5   topiramate (TOPAMAX) 100 MG tablet TAKE 1 TABLET IN MORNING AND 2 TABLETS AT NIGHT 270 tablet 3   valsartan-hydrochlorothiazide (DIOVAN-HCT) 320-12.5 MG tablet Take 1 tablet by mouth daily. 90 tablet 3   No facility-administered medications prior to visit.    Allergies  Allergen Reactions   Penicillins Anaphylaxis   Other Itching, Rash and Other (See Comments)    Strong sunlight - polymorphous light eruption    ROS Review of Systems  Constitutional:  Negative for activity change, appetite change, chills, diaphoresis and fatigue.  Respiratory:  Negative for cough, choking, shortness of breath and wheezing.   Cardiovascular:  Positive for leg swelling. Negative for chest pain and palpitations.  Gastrointestinal:  Negative for abdominal distention and abdominal pain.  Musculoskeletal:  Negative for arthralgias, gait problem and myalgias.  Neurological:  Positive for headaches. Negative for syncope, facial asymmetry, speech difficulty, weakness and numbness.  Psychiatric/Behavioral:  Negative for agitation, behavioral problems and confusion.       Objective:    Physical Exam Constitutional:      General: She is not in acute distress.    Appearance: She is obese. She is not ill-appearing, toxic-appearing or diaphoretic.  Eyes:     General: No scleral icterus.       Right eye: No discharge.        Left eye: No  discharge.     Extraocular Movements: Extraocular movements intact.     Conjunctiva/sclera: Conjunctivae normal.  Cardiovascular:     Rate and Rhythm: Normal rate and regular rhythm.     Pulses: Normal pulses.     Heart sounds: Normal heart sounds. No murmur heard.    No friction rub. No gallop.  Pulmonary:     Effort: Pulmonary effort is normal. No respiratory distress.     Breath sounds: Normal breath sounds. No stridor. No wheezing, rhonchi or rales.  Chest:     Chest wall: No tenderness.  Abdominal:  General: There is no distension.     Palpations: Abdomen is soft.     Tenderness: There is no abdominal tenderness. There is no guarding.  Musculoskeletal:        General: No swelling, tenderness, deformity or signs of injury.     Right lower leg: Edema present.     Left lower leg: Edema present.  Skin:    General: Skin is warm and dry.     Capillary Refill: Capillary refill takes less than 2 seconds.  Neurological:     Mental Status: She is alert.     Motor: No weakness.     Coordination: Coordination normal.     Gait: Gait normal.  Psychiatric:        Mood and Affect: Mood normal.        Behavior: Behavior normal.        Thought Content: Thought content normal.        Judgment: Judgment normal.     BP 126/88   Temp 98.8 F (37.1 C)   Ht _0  (1.6 m)   Wt 219 lb (99.3 kg)   SpO2 98%   BMI 38.79 kg/m  Wt Readings from Last 3 Encounters:  10/18/22 219 lb (99.3 kg)  09/27/22 228 lb (103.4 kg)  09/05/22 221 lb 6.4 oz (100.4 kg)    Lab Results  Component Value Date   TSH 0.747 02/03/2020   Lab Results  Component Value Date   WBC 5.7 09/05/2022   HGB 11.9 (L) 09/05/2022   HCT 35.3 (L) 09/05/2022   MCV 74.2 (L) 09/05/2022   PLT 283 09/05/2022   Lab Results  Component Value Date   NA 139 09/05/2022   K 3.2 (L) 09/05/2022   CO2 23 09/05/2022   GLUCOSE 120 (H) 09/05/2022   BUN 10 09/05/2022   CREATININE 0.81 09/05/2022   BILITOT 0.4 09/05/2022    ALKPHOS 69 09/05/2022   AST 15 09/05/2022   ALT 11 09/05/2022   PROT 6.2 (L) 09/05/2022   ALBUMIN 3.3 (L) 09/05/2022   CALCIUM 8.7 (L) 09/05/2022   ANIONGAP 7 09/05/2022   EGFR 83 09/02/2022   Lab Results  Component Value Date   CHOL 199 03/02/2022   Lab Results  Component Value Date   HDL 49 03/02/2022   Lab Results  Component Value Date   LDLCALC 138 (H) 03/02/2022   Lab Results  Component Value Date   TRIG 66 03/02/2022   Lab Results  Component Value Date   CHOLHDL 4.1 03/02/2022   Lab Results  Component Value Date   HGBA1C 6.3 (A) 06/02/2022   HGBA1C 6.3 06/02/2022   HGBA1C 6.3 06/02/2022   HGBA1C 6.3 06/02/2022      Assessment & Plan:   Problem List Items Addressed This Visit       Cardiovascular and Mediastinum   Benign essential HTN - Primary    BP Readings from Last 3 Encounters:  10/18/22 126/88  09/27/22 (!) 164/103  09/09/22 (!) 400/86   Systolic BP is normal, diastolic BP is slightly elevated but much improved. Will continue current medications for now DASH diet advised patient encouraged to engage in regular moderate to vigorous exercises at least 150 minutes weekly. Appreciate collaboration with the pharmacist Follow up as planned with PCP.         Other   Obesity    Body mass index is 38.79 kg/m.  Wt Readings from Last 3 Encounters:  10/18/22 219 lb (99.3 kg)  09/27/22 228  lb (103.4 kg)  09/05/22 221 lb 6.4 oz (100.4 kg)  States that she has been struggling with weight loss since she had her stroke, not been eating much but still gaining weight. Patient counseled on low-carb modified diet She was encouraged to engage in regular moderate to vigorous exercises at least 150 minutes weekly as tolerated. She is currently on Victoza which should also assist with weight loss.      Bilateral leg edema    Nonpitting edema to bilateral lower extremities. Patient encouraged to wear compression socks,keep legs elevated when sitting, avoid  salty foods to help decrease swelling. Patient encouraged to lose weight      Need for immunization against influenza    Patient educated on CDC recommendation for the influenza vaccine. Verbal consent was obtained from the patient, vaccine administered by nurse, no sign of adverse reactions noted at this time. Patient education on arm soreness and use of tylenol for this patient  was discussed. Patient educated on the signs and symptoms of adverse effect and advise to contact the office if they occur.      Relevant Orders   Flu Vaccine QUAD 73moIM (Fluarix, Fluzone & Alfiuria Quad PF) (Completed)   Other Visit Diagnoses     Screening for colon cancer       Relevant Orders   Cologuard       No orders of the defined types were placed in this encounter.   Follow-up: No follow-ups on file.    FRenee Rival FNP

## 2022-10-18 NOTE — Assessment & Plan Note (Signed)
Patient educated on CDC recommendation for the influenza vaccine. Verbal consent was obtained from the patient, vaccine administered by nurse, no sign of adverse reactions noted at this time. Patient education on arm soreness and use of tylenol for this patient  was discussed. Patient educated on the signs and symptoms of adverse effect and advise to contact the office if they occur.  

## 2022-10-19 ENCOUNTER — Other Ambulatory Visit: Payer: Self-pay | Admitting: Nurse Practitioner

## 2022-10-28 ENCOUNTER — Ambulatory Visit: Payer: Medicare Other | Admitting: Clinical

## 2022-11-22 DIAGNOSIS — Z1211 Encounter for screening for malignant neoplasm of colon: Secondary | ICD-10-CM | POA: Diagnosis not present

## 2022-11-22 LAB — COLOGUARD: Cologuard: NEGATIVE

## 2022-11-24 ENCOUNTER — Other Ambulatory Visit: Payer: Self-pay | Admitting: Nurse Practitioner

## 2022-11-24 ENCOUNTER — Telehealth: Payer: Self-pay | Admitting: Nurse Practitioner

## 2022-11-24 DIAGNOSIS — R0602 Shortness of breath: Secondary | ICD-10-CM

## 2022-11-24 MED ORDER — ALBUTEROL SULFATE HFA 108 (90 BASE) MCG/ACT IN AERS: 2.0000 | INHALATION_SPRAY | Freq: Four times a day (QID) | RESPIRATORY_TRACT | 11 refills | Status: DC | PRN

## 2022-11-24 NOTE — Telephone Encounter (Signed)
[facility] called requesting refill on Albuterol Inhaler.  Phone [phone]

## 2022-11-29 ENCOUNTER — Encounter: Payer: Medicare Other | Admitting: Clinical

## 2022-11-29 ENCOUNTER — Telehealth: Payer: Self-pay

## 2022-11-29 NOTE — Telephone Encounter (Signed)
Called pt to find out if she is taking losartan. No answer lvm for pt to call back. [place]

## 2022-11-30 ENCOUNTER — Ambulatory Visit (INDEPENDENT_AMBULATORY_CARE_PROVIDER_SITE_OTHER): Payer: 59 | Admitting: Clinical

## 2022-11-30 DIAGNOSIS — F3289 Other specified depressive episodes: Secondary | ICD-10-CM | POA: Diagnosis not present

## 2022-11-30 NOTE — BH Specialist Note (Signed)
Integrated Behavioral Health via Telemedicine Visit  11/30/2022 Sharon Chan [id number]  Number of [place] Clinician visits: 5-Fifth Visit  Session Start time: 1400   Session End time: 1500  Total time in minutes: 60   Referring Provider: Lazaro Arms, NP Patient/Family location: home [facility] Provider location: Patient [place] All persons participating in visit: patient, CSW Types of Service: Individual psychotherapy and Video visit  I connected with [name] via Geologist, engineering  (Video is Tree surgeon) and verified that I am speaking with the correct person using two identifiers. Discussed confidentiality: Yes   I discussed the limitations of telemedicine and the availability of in person appointments.  Discussed there is a possibility of technology failure and discussed alternative modes of communication if that failure occurs.  I discussed that engaging in this telemedicine visit, they consent to the provision of behavioral healthcare and the services will be billed under their insurance.  Patient and/or legal guardian expressed understanding and consented to Telemedicine visit: Yes   Presenting Concerns: Patient reports the following symptoms/concerns: depression and anxiety related to health concerns Duration of problem: several years; Severity of problem: severe  Patient and/or Family's Strengths/Protective Factors: Social connections, Social and Emotional competence, Concrete supports in place (healthy food, safe environments, etc.), and Sense of purpose   Goals Addressed: Patient will:  Reduce symptoms of: anxiety and depression   Increase knowledge and/or ability of: coping skills and self-management skills   Demonstrate ability to: Increase healthy adjustment to current life circumstances  Progress towards Goals: Ongoing  Interventions: Interventions utilized:  CBT Cognitive Behavioral  Therapy and Supportive Counseling Standardized Assessments completed: Not Needed  CBT today to explore patient thoughts and emotions related to social anxiety. Discussed how this is also related to patient's health conditions; she does not want others to have a negative view of her due to her conditions. Processed these thoughts and emotions and reframed to a more balanced conclusion. Also reflected on patient's self-advocacy and humor as strengths in social situations.  Patient and/or Family Response: Patient engaged in session.   Assessment: Patient currently experiencing depression and anxiety related to health conditions and anxiety related to social situations.   Patient may benefit from CBT to explore unhelpful beliefs about self and re-frame these beliefs to improve mood and decrease anxiety.  Plan: Follow up with behavioral health clinician on: 12/28/22  I discussed the assessment and treatment plan with the patient and/or parent/guardian. They were provided an opportunity to ask questions and all were answered. They agreed with the plan and demonstrated an understanding of the instructions.   They were advised to call back or seek an in-person evaluation if the symptoms worsen or if the condition fails to improve as anticipated.  Estanislado Emms, LCSW

## 2022-12-03 LAB — COLOGUARD: COLOGUARD: NEGATIVE

## 2022-12-05 NOTE — Progress Notes (Signed)
Normal Cologuard, repeat in 3 years

## 2022-12-07 ENCOUNTER — Telehealth: Payer: Self-pay

## 2022-12-07 DIAGNOSIS — R0602 Shortness of breath: Secondary | ICD-10-CM

## 2022-12-07 MED ORDER — LOSARTAN POTASSIUM 50 MG PO TABS: ORAL_TABLET | ORAL | 0 refills | Status: DC

## 2022-12-07 MED ORDER — ALBUTEROL SULFATE HFA 108 (90 BASE) MCG/ACT IN AERS: 2.0000 | INHALATION_SPRAY | Freq: Four times a day (QID) | RESPIRATORY_TRACT | 11 refills | Status: AC | PRN

## 2022-12-07 NOTE — Telephone Encounter (Signed)
Refills sent in

## 2022-12-22 ENCOUNTER — Ambulatory Visit (INDEPENDENT_AMBULATORY_CARE_PROVIDER_SITE_OTHER): Payer: 59

## 2022-12-22 VITALS — Ht 63.0 in | Wt 219.0 lb

## 2022-12-22 DIAGNOSIS — Z0000 Encounter for general adult medical examination without abnormal findings: Secondary | ICD-10-CM | POA: Diagnosis not present

## 2022-12-22 NOTE — Patient Instructions (Addendum)
Sharon Chan , Thank you for taking time to come for your Medicare Wellness Visit. I appreciate your ongoing commitment to your health goals. Please review the following plan we discussed and let me know if I can assist you in the future.   These are the goals we discussed:  Goals       Patient stated (pt-stated)      I want to become more independent.        This is a list of the screening recommended for you and due dates:  Health Maintenance  Topic Date Due   DTaP/Tdap/Td vaccine (1 - Tdap) Never done   Pap Smear  Never done   Hemoglobin A1C  12/03/2022   Eye exam for diabetics  12/22/2022*   Yearly kidney health urinalysis for diabetes  12/23/2022*   Complete foot exam   12/23/2022*   COVID-19 Vaccine (3 - 2023-24 season) 01/07/2023*   Colon Cancer Screening  12/23/2023*   Hepatitis C Screening: USPSTF Recommendation to screen - Ages 49-79 yo.  12/23/2023*   Yearly kidney function blood test for diabetes  09/06/2023   Medicare Annual Wellness Visit  12/23/2023   Flu Shot  Completed   HIV Screening  Completed   HPV Vaccine  Aged Out  *Topic was postponed. The date shown is not the original due date.    Advanced directives: Advance directive discussed with you today. Even though you declined this today, please call our office should you change your mind, and we can give you the proper paperwork for you to fill out.   Conditions/risks identified: None  Next appointment: Follow up in one year for your annual wellness visit.    Preventive Care 49-64 Years, Female Preventive care refers to lifestyle choices and visits with your health care provider that can promote health and wellness. What does preventive care include? A yearly physical exam. This is also called an annual well check. Dental exams once or twice a year. Routine eye exams. Ask your health care provider how often you should have your eyes checked. Personal lifestyle choices, including: Daily care of your  teeth and gums. Regular physical activity. Eating a healthy diet. Avoiding tobacco and drug use. Limiting alcohol use. Practicing safe sex. Taking low-dose aspirin daily starting at age 49. Taking vitamin and mineral supplements as recommended by your health care provider. What happens during an annual well check? The services and screenings done by your health care provider during your annual well check will depend on your age, overall health, lifestyle risk factors, and family history of disease. 47 Counseling  Your health care provider may ask you questions about your: Alcohol use. Tobacco use. Drug use. Emotional well-being. Home and relationship well-being. Sexual activity. Eating habits. Work and work Statistician. Method of birth control. Menstrual cycle. Pregnancy history. Screening  You may have the following tests or measurements: Height, weight, and BMI. Blood pressure. Lipid and cholesterol levels. These may be checked every 5 years, or more frequently if you are over 63 years old. Skin check. Lung cancer screening. You may have this screening every year starting at age 49 if you have a 30-pack-year history of smoking and currently smoke or have quit within the past 15 years. Fecal occult blood test (FOBT) of the stool. You may have this test every year starting at age 49. Flexible sigmoidoscopy or colonoscopy. You may have a sigmoidoscopy every 5 years or a colonoscopy every 10 years starting at age 49. Hepatitis C blood test. Hepatitis B  blood test. Sexually transmitted disease (STD) testing. Diabetes screening. This is done by checking your blood sugar (glucose) after you have not eaten for a while (fasting). You may have this done every 1-3 years. Mammogram. This may be done every 1-2 years. Talk to your health care provider about when you should start having regular mammograms. This may depend on whether you have a family history of breast cancer. BRCA-related cancer  screening. This may be done if you have a family history of breast, ovarian, tubal, or peritoneal cancers. Pelvic exam and Pap test. This may be done every 3 years starting at age 49. Starting at age 49, this may be done every 5 years if you have a Pap test in combination with an HPV test. Bone density scan. This is done to screen for osteoporosis. You may have this scan if you are at high risk for osteoporosis. Discuss your test results, treatment options, and if necessary, the need for more tests with your health care provider. Vaccines  Your health care provider may recommend certain vaccines, such as: Influenza vaccine. This is recommended every year. Tetanus, diphtheria, and acellular pertussis (Tdap, Td) vaccine. You may need a Td booster every 10 years. Zoster vaccine. You may need this after age 49. Pneumococcal 13-valent conjugate (PCV13) vaccine. You may need this if you have certain conditions and were not previously vaccinated. Pneumococcal polysaccharide (PPSV23) vaccine. You may need one or two doses if you smoke cigarettes or if you have certain conditions. Talk to your health care provider about which screenings and vaccines you need and how often you need them. This information is not intended to replace advice given to you by your health care provider. Make sure you discuss any questions you have with your health care provider. Document Released: 11/27/2015 Document Revised: 07/20/2016 Document Reviewed: 09/01/2015 Elsevier Interactive Patient Education  2017 [place] Prevention in the Home Falls can cause injuries. They can happen to people of all ages. There are many things you can do to make your home safe and to help prevent falls. What can I do on the outside of my home? Regularly fix the edges of walkways and driveways and fix any cracks. Remove anything that might make you trip as you walk through a door, such as a raised step or threshold. Trim any  bushes or trees on the path to your home. Use bright outdoor lighting. Clear any walking paths of anything that might make someone trip, such as rocks or tools. Regularly check to see if handrails are loose or broken. Make sure that both sides of any steps have handrails. Any raised decks and porches should have guardrails on the edges. Have any leaves, snow, or ice cleared regularly. Use sand or salt on walking paths during winter. Clean up any spills in your garage right away. This includes oil or grease spills. What can I do in the bathroom? Use night lights. Install grab bars by the toilet and in the tub and shower. Do not use towel bars as grab bars. Use non-skid mats or decals in the tub or shower. If you need to sit down in the shower, use a plastic, non-slip stool. Keep the floor dry. Clean up any water that spills on the floor as soon as it happens. Remove soap buildup in the tub or shower regularly. Attach bath mats securely with double-sided non-slip rug tape. Do not have throw rugs and other things on the floor that can make  you trip. What can I do in the bedroom? Use night lights. Make sure that you have a light by your bed that is easy to reach. Do not use any sheets or blankets that are too big for your bed. They should not hang down onto the floor. Have a firm chair that has side arms. You can use this for support while you get dressed. Do not have throw rugs and other things on the floor that can make you trip. What can I do in the kitchen? Clean up any spills right away. Avoid walking on wet floors. Keep items that you use a lot in easy-to-reach places. If you need to reach something above you, use a strong step stool that has a grab bar. Keep electrical cords out of the way. Do not use floor polish or wax that makes floors slippery. If you must use wax, use non-skid floor wax. Do not have throw rugs and other things on the floor that can make you trip. What can I do  with my stairs? Do not leave any items on the stairs. Make sure that there are handrails on both sides of the stairs and use them. Fix handrails that are broken or loose. Make sure that handrails are as long as the stairways. Check any carpeting to make sure that it is firmly attached to the stairs. Fix any carpet that is loose or worn. Avoid having throw rugs at the top or bottom of the stairs. If you do have throw rugs, attach them to the floor with carpet tape. Make sure that you have a light switch at the top of the stairs and the bottom of the stairs. If you do not have them, ask someone to add them for you. What else can I do to help prevent falls? Wear shoes that: Do not have high heels. Have rubber bottoms. Are comfortable and fit you well. Are closed at the toe. Do not wear sandals. If you use a stepladder: Make sure that it is fully opened. Do not climb a closed stepladder. Make sure that both sides of the stepladder are locked into place. Ask someone to hold it for you, if possible. Clearly mark and make sure that you can see: Any grab bars or handrails. First and last steps. Where the edge of each step is. Use tools that help you move around (mobility aids) if they are needed. These include: Canes. Walkers. Scooters. Crutches. Turn on the lights when you go into a dark area. Replace any light bulbs as soon as they burn out. Set up your furniture so you have a clear path. Avoid moving your furniture around. If any of your floors are uneven, fix them. If there are any pets around you, be aware of where they are. Review your medicines with your doctor. Some medicines can make you feel dizzy. This can increase your chance of falling. Ask your doctor what other things that you can do to help prevent falls. This information is not intended to replace advice given to you by your health care provider. Make sure you discuss any questions you have with your health care  provider. Document Released: 08/27/2009 Document Revised: 04/07/2016 Document Reviewed: 12/05/2014 Elsevier Interactive Patient Education  2017 [employer].

## 2022-12-22 NOTE — Progress Notes (Signed)
Subjective:   Sharon Chan is a 47 y.o. female who presents for an Initial Medicare Annual Wellness Visit.  Review of Systems    Virtual Visit via Telephone Note  I connected with  Sharon Chan on 12/22/22 at  2:00 PM EST by telephone and verified that I am speaking with the correct person using two identifiers.  Location: Patient: Home Provider: Office Persons participating in the virtual visit: patient/Nurse Health Advisor   I discussed the limitations, risks, security and privacy concerns of performing an evaluation and management service by telephone and the availability of in person appointments. The patient expressed understanding and agreed to proceed.  Interactive audio and video telecommunications were attempted between this nurse and patient, however failed, due to patient having technical difficulties OR patient did not have access to video capability.  We continued and completed visit with audio only.  Some vital signs may be absent or patient reported.   Criselda Peaches, LPN  Cardiac Risk Factors include: advanced age (>4mn, >>37women);hypertension;obesity (BMI >30kg/m2);Other (see comment), Risk factor comments: Dx Stroke     Objective:    Today's Vitals   12/22/22 1439  Weight: 219 lb (99.3 kg)  Height: 5' 3"$  (1.6 m)   Body mass index is 38.79 kg/m.     12/22/2022    2:56 PM 09/27/2022    3:31 PM 09/05/2022    4:00 PM 07/27/2022    8:34 PM 06/23/2022   10:17 AM 03/31/2022   10:24 AM 03/09/2022    4:07 PM  Advanced Directives  Does Patient Have a Medical Advance Directive? No No No No No Yes Yes  Type of Advance Directive       [place] Does patient want to make changes to medical advance directive?       No - Patient declined  [place]?       No - copy requested  Would patient like information on creating a medical advance directive? No - Patient declined  No - Patient declined Yes  (MAU/Ambulatory/Procedural Areas - Information given)       Current Medications (verified) Outpatient Encounter Medications as of 12/22/2022  Medication Sig   albuterol (VENTOLIN HFA) 108 (90 Base) MCG/ACT inhaler Inhale 2 puffs into the lungs every 6 (six) hours as needed for wheezing or shortness of breath.   amLODipine (NORVASC) 5 MG tablet Take 1 tablet (5 mg total) by mouth daily.   aspirin EC 81 MG tablet Take 81 mg by mouth in the morning.   atorvastatin (LIPITOR) 40 MG tablet Take 1 tablet (40 mg total) by mouth daily.   blood glucose meter kit and supplies KIT Dispense based on patient and insurance preference. Use up to four times daily as directed. (FOR ICD-9 250.00, 250.01).   cyclobenzaprine (FLEXERIL) 5 MG tablet TAKE ONE TABLET BY MOUTH THREE TIMES DAILY AS NEEDED (VIAL)   diphenhydrAMINE (BENADRYL) 25 MG tablet Take 25 mg by mouth every 6 (six) hours as needed for allergies.   divalproex (DEPAKOTE ER) 500 MG 24 hr tablet Take 1 tablet twice a day   eszopiclone (LUNESTA) 1 MG TABS tablet Take 1 tablet (1 mg total) by mouth at bedtime as needed for sleep. Take immediately before bedtime   Insulin Pen Needle (BD PEN NEEDLE NANO U/F) 32G X 4 MM MISC Inject 1 Units into the skin 2 (two) times daily.   liraglutide (VICTOZA) 18 MG/3ML SOPN Inject 1.2  mg into the skin every morning. (Patient taking differently: Inject 0.6 mg into the skin in the morning.)   losartan (COZAAR) 50 MG tablet SMARTSIG:1 Tablet(s) By Mouth   nortriptyline (PAMELOR) 25 MG capsule Take 3 capsules every night   OneTouch Delica Lancets 99991111 MISC TES 2 (TWO) TIMES DAILY.   topiramate (TOPAMAX) 100 MG tablet TAKE 1 TABLET IN MORNING AND 2 TABLETS AT NIGHT   valsartan-hydrochlorothiazide (DIOVAN-HCT) 320-12.5 MG tablet Take 1 tablet by mouth daily.   No facility-administered encounter medications on file as of 12/22/2022.    Allergies (verified) Penicillins and Other   History: Past Medical History:  Diagnosis  Date   Anxiety    CVA (cerebral vascular accident) ([place])    Depression    DM type 2 (diabetes mellitus, type 2) (HCC)    HLD (hyperlipidemia)    HTN (hypertension)    Joint pain    Migraines    Neck pain    Obese    Polycystic disease, ovaries    Right sided weakness    Seizures (HCC)    Shoulder pain    Sickle cell trait (HCC)    Thyroid nodule    Vitamin D deficiency 01/2020   Past Surgical History:  Procedure Laterality Date   LAPAROSCOPIC CHOLECYSTECTOMY     Family History  Problem Relation Age of Onset   Heart attack Mother    Diabetes Mother    Hypertension Mother    Hyperlipidemia Mother    Post-traumatic stress disorder Father        committed suicide   Diabetes Father    Hypertension Sister    Breast cancer Maternal Grandmother    Breast cancer Paternal Grandmother    Thyroid cancer Paternal Grandmother    Social History   Socioeconomic History   Marital status: Single    Spouse name: Not on file   Number of children: 0   Years of education: Not on file   Highest education level: Not on file  Occupational History   Not on file  Tobacco Use   Smoking status: Never   Smokeless tobacco: Never  Vaping Use   Vaping Use: Never used  Substance and Sexual Activity   Alcohol use: No   Drug use: No   Sexual activity: Not Currently  Other Topics Concern   Not on file  Social History Narrative   Lives at home, mom currently staying with her   Left-handed   Caffeine: occasional decaf coffee or raspberry tea   One story home   Social Determinants of Health   Financial Resource Strain: Low Risk  (12/22/2022)   Overall Financial Resource Strain (CARDIA)    Difficulty of Paying Living Expenses: Not hard at all  Food Insecurity: No Food Insecurity (12/22/2022)   Hunger Vital Sign    Worried About Running Out of Food in the Last Year: Never true    [place] in the Last Year: Never true  Transportation Needs: No Transportation Needs (12/22/2022)    PRAPARE - Hydrologist (Medical): No    Lack of Transportation (Non-Medical): No  Physical Activity: Insufficiently Active (12/22/2022)   Exercise Vital Sign    Days of Exercise per Week: 3 days    Minutes of Exercise per Session: 20 min  Stress: No Stress Concern Present (12/22/2022)   [place]    Feeling of Stress : Not at all  Social Connections: Moderately Integrated (  12/22/2022)   Social Connection and Isolation Panel [NHANES]    Frequency of Communication with Friends and Family: More than three times a week    Frequency of Social Gatherings with Friends and Family: More than three times a week    Attends Religious Services: More than 4 times per year    Active Member of [employer] or Organizations: Yes    Attends Music therapist: More than 4 times per year    Marital Status: Never married    Tobacco Counseling Counseling given: Not Answered   Clinical Intake:  Pre-visit preparation completed: Yes  Pain : No/denies pain     BMI - recorded: 38.79 Nutritional Status: BMI > 30  Obese Nutritional Risks: None Diabetes: No  How often do you need to have someone help you when you read instructions, pamphlets, or other written materials from your doctor or pharmacy?: 3 - Sometimes (Mother assist)  Diabetic? No  Interpreter Needed?: No  Information entered by :: Rolene Arbour LPN   Activities of Daily Living    12/22/2022    2:54 PM 12/22/2022    2:48 PM  In your present state of health, do you have any difficulty performing the following activities:  Hearing? 0 0  Vision? 0 0  Difficulty concentrating or making decisions? 1 1  Comment  Followed by PCP/Currently receiving counseling  Walking or climbing stairs? 1 1  Comment  Due to Dx Stroke, Followe by PCP  Dressing or bathing? 0 1  Comment  Mother Assist  Doing errands, shopping? 1 1  Comment  Mother assist   Preparing Food and eating ?  N  Using the Toilet?  N  In the past six months, have you accidently leaked urine?  Y  Comment  Wears pads, followed by PCP  Do you have problems with loss of bowel control?  N  Managing your Medications?  Y  Comment  Mother Assist  Managing your Finances?  Y  Comment  Mother Assist  Housekeeping or managing your Housekeeping?  Y  Comment  Mother assist    Patient Care Team: Fenton Foy, NP as PCP - General (Adult Health Nurse Practitioner)  Indicate any recent Medical Services you may have received from other than [facility] providers in the past year (date may be approximate).     Assessment:   This is a routine wellness examination for Nadelyn.  Hearing/Vision screen Hearing Screening - Comments:: Denies hearing difficulties   Vision Screening - Comments:: - up to date with routine eye exams with  Dr Truman Hayward  Dietary issues and exercise activities discussed: Exercise limited by: None identified;neurologic condition(s)   Goals Addressed               This Visit's Progress     Patient stated (pt-stated)        I want to become more independent.       Depression Screen    10/18/2022    3:46 PM 03/02/2022   10:00 AM 10/18/2019    3:04 PM 01/01/2019    1:54 PM 10/01/2018    1:54 PM 08/10/2018    2:49 PM 06/11/2018    2:01 PM  PHQ 2/9 Scores  PHQ - 2 Score 0 6 0 0 0 0 1  PHQ- 9 Score  18     12    Fall Risk    12/22/2022    2:54 PM 12/21/2022    4:42 PM 09/27/2022    3:31 PM 09/02/2022  9:56 AM 06/23/2022   10:17 AM  Fall Risk   Falls in the past year? 0 1 0 1 1  Number falls in past yr: 0 1 0 1 1  Injury with Fall? 0 0 0 0 0  Comment No medical attention needed      Risk for fall due to : No Fall Risks   Impaired balance/gait   Follow up Falls prevention discussed  Falls evaluation completed      FALL RISK PREVENTION PERTAINING TO THE HOME:  Any stairs in or around the home? No  If so, are there any without handrails? No   Home free of loose throw rugs in walkways, pet beds, electrical cords, etc? Yes  Adequate lighting in your home to reduce risk of falls? Yes   ASSISTIVE DEVICES UTILIZED TO PREVENT FALLS:  Life alert? No  Use of a cane, walker or w/c? Yes  Grab bars in the bathroom? Yes  Shower chair or bench in shower? Yes  Elevated toilet seat or a handicapped toilet? Yes   TIMED UP AND GO:  Was the test performed? No . Audio Visit  Cognitive Function:    10/01/2018    2:01 PM  MMSE - Mini Mental State Exam  Orientation to time 5  Orientation to Place 5  Registration 3  Attention/ Calculation 5  Recall 3  Language- name 2 objects 2  Language- repeat 1  Language- follow 3 step command 3  Language- read & follow direction 1  Write a sentence 1  Copy design 1  Total score 30        12/22/2022    2:56 PM  6CIT Screen  What Year? 0 points  What month? 0 points  What time? 0 points  Count back from 20 0 points  Months in reverse 0 points  Repeat phrase 0 points  Total Score 0 points    Immunizations Immunization History  Administered Date(s) Administered   Influenza,inj,Quad PF,6+ Mos 09/12/2017, 10/01/2018, 11/20/2019, 10/18/2022   PFIZER(Purple Top)SARS-COV-2 Vaccination 02/06/2020, 02/25/2020    TDAP status: Due, Education has been provided regarding the importance of this vaccine. Advised may receive this vaccine at local pharmacy or Health Dept. Aware to provide a copy of the vaccination record if obtained from local pharmacy or Health Dept. Verbalized acceptance and understanding.  Flu Vaccine status: Completed at today's visit    Covid-19 vaccine status: Completed vaccines  Qualifies for Shingles Vaccine? No   Zostavax completed No   Shingrix Completed?: No.    Education has been provided regarding the importance of this vaccine. Patient has been advised to call insurance company to determine out of pocket expense if they have not yet received this vaccine. Advised  may also receive vaccine at local pharmacy or Health Dept. Verbalized acceptance and understanding.  Screening Tests Health Maintenance  Topic Date Due   DTaP/Tdap/Td (1 - Tdap) Never done   PAP SMEAR-Modifier  Never done   HEMOGLOBIN A1C  12/03/2022   OPHTHALMOLOGY EXAM  12/22/2022 (Originally 11/15/1985)   Diabetic kidney evaluation - Urine ACR  12/23/2022 (Originally 11/15/1991)   FOOT EXAM  12/23/2022 (Originally 01/02/2020)   COVID-19 Vaccine (3 - 2023-24 season) 01/07/2023 (Originally 07/15/2022)   COLONOSCOPY (Pts 45-85yr Insurance coverage will need to be confirmed)  12/23/2023 (Originally 11/14/2018)   Hepatitis C Screening  12/23/2023 (Originally 11/15/1993)   Diabetic kidney evaluation - eGFR measurement  09/06/2023   Medicare Annual Wellness (AWV)  12/23/2023   INFLUENZA VACCINE  Completed  HIV Screening  Completed   HPV VACCINES  Aged Out    Health Maintenance  Health Maintenance Due  Topic Date Due   DTaP/Tdap/Td (1 - Tdap) Never done   PAP SMEAR-Modifier  Never done   HEMOGLOBIN A1C  12/03/2022          Lung Cancer Screening: (Low Dose CT Chest recommended if Age 30-80 years, 30 pack-year currently smoking OR have quit w/in 15years.) does not qualify.     Additional Screening:  Hepatitis C Screening: does qualify; Deferred  Vision Screening: Recommended annual ophthalmology exams for early detection of glaucoma and other disorders of the eye. Is the patient up to date with their annual eye exam?  Yes  Who is the provider or what is the name of the office in which the patient attends annual eye exams? Dr Truman Hayward If pt is not established with a provider, would they like to be referred to a provider to establish care? No .   Dental Screening: Recommended annual dental exams for proper oral hygiene  Community Resource Referral / Chronic Care Management:  CRR required this visit?  No   CCM required this visit?  No      Plan:     I have personally reviewed  and noted the following in the patient's chart:   Medical and social history Use of alcohol, tobacco or illicit drugs  Current medications and supplements including opioid prescriptions. Patient is not currently taking opioid prescriptions. Functional ability and status Nutritional status Physical activity Advanced directives List of other physicians Hospitalizations, surgeries, and ER visits in previous 12 months Vitals Screenings to include cognitive, depression, and falls Referrals and appointments  In addition, I have reviewed and discussed with patient certain preventive protocols, quality metrics, and best practice recommendations. A written personalized care plan for preventive services as well as general preventive health recommendations were provided to patient.     Criselda Peaches, LPN   [id number]   Nurse Notes: Patient due Diabetic kidney evaluation-Urine ACR

## 2022-12-28 ENCOUNTER — Ambulatory Visit (INDEPENDENT_AMBULATORY_CARE_PROVIDER_SITE_OTHER): Payer: 59 | Admitting: Clinical

## 2022-12-28 DIAGNOSIS — F3289 Other specified depressive episodes: Secondary | ICD-10-CM | POA: Diagnosis not present

## 2022-12-28 DIAGNOSIS — F419 Anxiety disorder, unspecified: Secondary | ICD-10-CM

## 2022-12-28 NOTE — BH Specialist Note (Unsigned)
Ok to leave VM and mail and email ok Refer to legal aid   Food scarcity? Not eating to make sure there's enough for dad  Not sleeping still, up at 1:30am for the day  Family there trying to get rid of her from dad's house Not sure when they're leaving  Disability getting cut off, started in 2001, has not told fiance yet, he's coming later    Sent legal aid referral

## 2023-01-10 ENCOUNTER — Other Ambulatory Visit: Payer: Self-pay

## 2023-01-10 NOTE — Telephone Encounter (Signed)
error 

## 2023-01-11 ENCOUNTER — Encounter: Payer: Self-pay | Admitting: Pharmacist

## 2023-01-13 ENCOUNTER — Other Ambulatory Visit: Payer: 59 | Admitting: Pharmacist

## 2023-01-13 ENCOUNTER — Other Ambulatory Visit (HOSPITAL_COMMUNITY): Payer: Self-pay

## 2023-01-13 ENCOUNTER — Telehealth (INDEPENDENT_AMBULATORY_CARE_PROVIDER_SITE_OTHER): Payer: 59 | Admitting: Nurse Practitioner

## 2023-01-13 ENCOUNTER — Encounter: Payer: Self-pay | Admitting: Nurse Practitioner

## 2023-01-13 VITALS — Temp 102.5°F | Wt 219.0 lb

## 2023-01-13 DIAGNOSIS — J069 Acute upper respiratory infection, unspecified: Secondary | ICD-10-CM

## 2023-01-13 DIAGNOSIS — I10 Essential (primary) hypertension: Secondary | ICD-10-CM

## 2023-01-13 MED ORDER — PREDNISONE 20 MG PO TABS: 20.0000 mg | ORAL_TABLET | Freq: Every day | ORAL | 0 refills | Status: AC

## 2023-01-13 MED ORDER — BENZONATATE 200 MG PO CAPS: 200.0000 mg | ORAL_CAPSULE | Freq: Two times a day (BID) | ORAL | 0 refills | Status: DC | PRN

## 2023-01-13 MED ORDER — AZITHROMYCIN 250 MG PO TABS: ORAL_TABLET | ORAL | 0 refills | Status: AC

## 2023-01-13 MED ORDER — OZEMPIC (0.25 OR 0.5 MG/DOSE) 2 MG/3ML ~~LOC~~ SOPN
PEN_INJECTOR | SUBCUTANEOUS | 1 refills | Status: DC
  Filled 2023-01-13: qty 9, 84d supply, fill #0

## 2023-01-13 NOTE — Progress Notes (Signed)
Virtual Visit via Video Note  I connected with Sharon Chan on 01/13/23 at 11:00 AM EST by a video enabled telemedicine application and verified that I am speaking with the correct person using two identifiers.  Location: Patient: home Provider: office   I discussed the limitations of evaluation and management by telemedicine and the availability of in person appointments. The patient expressed understanding and agreed to proceed.  History of Present Illness:  Patient presents today through video visit for sick visit today.  Patient states that last Sunday at church she was exposed to RSV.  She states that they have had an outbreak of RSV at her church.  She also states that there are some members with flu and COVID as well.  Patient is with the associated pastors and came down with symptoms on Monday.  Patient has been having runny nose, cough, fever which started yesterday.  Her temperature today is 102.5.  Patient does seem to be in good spirits but is concerned about her cough and history of developing pneumonia.  Patient did test negative for COVID through home test.  Patient is allergic to amoxicillin.  We will go ahead and treat her with azithromycin, prednisone, cough medicine.  Concerned for worsening symptoms going into the weekend. Denies f/c/s, n/v/d, hemoptysis, PND, leg swelling Denies chest pain or edema     Observations/Objective:     01/13/2023   10:56 AM 12/22/2022    2:39 PM 10/18/2022    3:54 PM  Vitals with BMI  Height  '5\' 3"'$    Weight 219 lbs 219 lbs   BMI [id number]   Systolic   [id number]  Diastolic   88      Assessment and Plan:  1. Upper respiratory tract infection, unspecified type  - azithromycin (ZITHROMAX) 250 MG tablet; Take 2 tablets on day 1, then 1 tablet daily on days 2 through 5  Dispense: 6 tablet; Refill: 0 - predniSONE (DELTASONE) 20 MG tablet; Take 1 tablet (20 mg total) by mouth daily with breakfast for 5 days.  Dispense: 5 tablet; Refill:  0 - benzonatate (TESSALON) 200 MG capsule; Take 1 capsule (200 mg total) by mouth 2 (two) times daily as needed for cough.  Dispense: 20 capsule; Refill: 0  Follow up:  Follow up as scheduled    I discussed the assessment and treatment plan with the patient. The patient was provided an opportunity to ask questions and all were answered. The patient agreed with the plan and demonstrated an understanding of the instructions.   The patient was advised to call back or seek an in-person evaluation if the symptoms worsen or if the condition fails to improve as anticipated.  I provided 23 minutes of non-face-to-face time during this encounter.   Fenton Foy, NP

## 2023-01-13 NOTE — Progress Notes (Signed)
01/13/2023 Name: Sharon Chan MRN: [id number] DOB: 11-May-1976  Chief Complaint  Patient presents with   Medication Management   Diabetes   Hypertension   Hyperlipidemia    Sharon Chan is a 47 y.o. year old female who presented for a telephone visit.   They were referred to the pharmacist by their PCP for assistance in managing diabetes.   Subjective:  Care Team: Primary Care Provider: Fenton Foy, NP ; Next Scheduled Visit: 03/03/23  Medication Access/Adherence  Current Pharmacy:  [id number] [id number] [id number]: [phone]: [phone] [facility] [id number]: [phone]: [phone]  Patient reports affordability concerns with their medications: No  Patient reports access/transportation concerns to their pharmacy: No  Patient reports adherence concerns with their medications:  Yes    Reports she has a difficult time connecting with [facility] and they continue to send meds that have been discontinued - they recently filled both telmisartan and losartan. Patient has not been taking losartan.   Discussed adherence packaging with [facility]. Patient is very interested in this.   Diabetes:  Current medications: Victoza 0.6 mg daily  Reports her insurance notified her that they would soon not be covering this medication.   Hypertension:  Current medications: valsartan/HCTZ 320/12.5 mg daily, amlodipine 5 mg daily   Patient has a validated, automated, upper arm home BP cuff Current blood pressure readings readings: reports readings have been significantly elevated in the past week. Notes a pattern than blood pressures are always most elevated around her menstrual cycle.   Patient denies hypotensive s/sx including dizziness, lightheadedness.   Patient reports hypertensive symptoms including headache - though consistent headaches since her stroke. Does not have an OBGYN Objective:  Lab Results  Component Value Date   HGBA1C 6.3 (A) 06/02/2022   HGBA1C 6.3 06/02/2022   HGBA1C 6.3 06/02/2022   HGBA1C 6.3 06/02/2022    Lab Results  Component Value Date   CREATININE 0.81 09/05/2022   BUN 10 09/05/2022   NA 139 09/05/2022   K 3.2 (L) 09/05/2022   CL 109 09/05/2022   CO2 23 09/05/2022    Lab Results  Component Value Date   CHOL 199 03/02/2022   HDL 49 03/02/2022   LDLCALC 138 (H) 03/02/2022   TRIG 66 03/02/2022   CHOLHDL 4.1 03/02/2022    Medications Reviewed Today     Reviewed by HOsker Mason RPH-CPP (Pharmacist) on 01/13/23 at 1001  Med List Status: <None>   Medication Order Taking? Sig Documenting Provider Last Dose Status Informant  albuterol (VENTOLIN HFA) 108 (90 Base) MCG/ACT inhaler [id number] Inhale 2 puffs into the lungs every 6 (six) hours as needed for wheezing or shortness of breath. NFenton Foy NP Taking Active   amLODipine (NORVASC) 5 MG tablet [id number] Take 1 tablet (5 mg total) by mouth daily. JTresa Garter MD Taking Active   aspirin EC 81 MG tablet [id number] Take 81 mg by mouth in the morning. [provider] Taking Active Self  atorvastatin (LIPITOR) 40 MG tablet [id number] Take 1 tablet (40 mg total) by mouth daily. NFenton Foy NP Taking Active Pharmacy Records, Self  blood glucose meter kit and supplies KIT [id number] Dispense based on patient and insurance preference. Use up to four times  daily as directed. (FOR ICD-9 250.00, 250.01). Starlyn Skeans, MD  Active Pharmacy Records, Self  cyclobenzaprine (FLEXERIL) 5 MG tablet [id number] Yes TAKE ONE TABLET BY MOUTH THREE TIMES DAILY AS NEEDED (VIAL) Fenton Foy, NP Taking Active   diphenhydrAMINE (BENADRYL) 25 MG tablet [id number]  Take 25 mg by mouth every 6 (six) hours as needed for allergies.  [provider]  Active Pharmacy Records, Self           Med Note (COFFELL, Dennard Nip Sep 05, 2022  9:31 AM)    divalproex (DEPAKOTE ER) 500 MG 24 hr tablet [id number] Yes Take 1 tablet twice a day Cameron Sprang, MD Taking Active   eszopiclone (LUNESTA) 1 MG TABS tablet [id number] Yes Take 1 tablet (1 mg total) by mouth at bedtime as needed for sleep. Take immediately before bedtime Cobb, Karie Schwalbe, NP Taking Active Pharmacy Records, Self  liraglutide (VICTOZA) 18 MG/3ML SOPN [id number] No Inject 1.2 mg into the skin every morning.  Patient not taking: Reported on 01/13/2023   Fenton Foy, NP Not Taking Active Pharmacy Records, Self           Med Note (COFFELL, Dennard Nip Sep 05, 2022 10:08 AM)    nortriptyline [facility]) 25 MG capsule [id number] Yes Take 3 capsules every night Cameron Sprang, MD Taking Active   OneTouch Delica Lancets [id number] [place] [id number]  TES 2 (TWO) TIMES DAILY. Azzie Glatter, FNP  Active Pharmacy Records, Self  topiramate (TOPAMAX) 100 MG tablet [id number] Yes TAKE 1 TABLET IN MORNING AND 2 TABLETS AT [place] Pho, MD Taking Active   valsartan-hydrochlorothiazide (DIOVAN-HCT) 320-12.5 MG tablet [id number] Yes Take 1 tablet by mouth daily. Fenton Foy, NP Taking Active Pharmacy Records, Self              Assessment/Plan:   Diabetes: - Currently controlled - Recommend to switch to Ozempic due to insurance preference. Discussed differences in dosing. Complete current Victoza supply and start Ozempic 0.25 mg weekly.    Hypertension: - Currently inconsistent control - Reviewed long term cardiovascular and renal outcomes of uncontrolled blood pressure - Reviewed appropriate blood pressure monitoring technique and reviewed goal blood pressure. Recommended to check home blood pressure and heart rate periodically - Recommend to refer to GYN for discussions around heavy/concerning menstrual cycle symptoms     Follow Up Plan: phone  call in 8 weeks  [name], PharmD, [place], [place] [phone]

## 2023-01-13 NOTE — Patient Instructions (Signed)
Hi Niesha,   Complete your supply of Victoza then start Ozempic 0.25 mg weekly.   We'll talk to [facility] about a referral to gynecology to discuss your symptoms of elevated blood pressure around your period.   I'll ask the pharmacy to work on adherence packaging for you.   Thanks!  Catie Jodi Mourning, PharmD

## 2023-01-13 NOTE — Telephone Encounter (Signed)
Pt was made a virtual appointment for today 01/13/23

## 2023-01-13 NOTE — Patient Instructions (Signed)
1. Upper respiratory tract infection, unspecified type  - azithromycin (ZITHROMAX) 250 MG tablet; Take 2 tablets on day 1, then 1 tablet daily on days 2 through 5  Dispense: 6 tablet; Refill: 0 - predniSONE (DELTASONE) 20 MG tablet; Take 1 tablet (20 mg total) by mouth daily with breakfast for 5 days.  Dispense: 5 tablet; Refill: 0 - benzonatate (TESSALON) 200 MG capsule; Take 1 capsule (200 mg total) by mouth 2 (two) times daily as needed for cough.  Dispense: 20 capsule; Refill: 0  Follow up:  Follow up as scheduled

## 2023-01-19 ENCOUNTER — Other Ambulatory Visit: Payer: Self-pay

## 2023-01-19 ENCOUNTER — Telehealth: Payer: Self-pay | Admitting: Nurse Practitioner

## 2023-01-19 ENCOUNTER — Other Ambulatory Visit: Payer: Self-pay | Admitting: Nurse Practitioner

## 2023-01-19 DIAGNOSIS — I10 Essential (primary) hypertension: Secondary | ICD-10-CM

## 2023-01-19 MED ORDER — VALSARTAN-HYDROCHLOROTHIAZIDE 320-12.5 MG PO TABS: 1.0000 | ORAL_TABLET | Freq: Every day | ORAL | 3 refills | Status: DC

## 2023-01-19 NOTE — Telephone Encounter (Signed)
I only have valsartan on my list and this has been sent for refill. Thanks.

## 2023-01-19 NOTE — Telephone Encounter (Signed)
Sharon Chan from [place] [facility] LVM on nurse line on 01/19/23 requesting 1. clarification on e-script for Losartin '50mg'$ . Prescription has dosage, but not frequency. Please clarify. 2. Patient has been taking Valsartin '320mg'$ -12.'5mg'$  for a few months. Does the Losartin replace the Valsartin?  Please call the pharmacist line at [phone] ASAP

## 2023-01-23 ENCOUNTER — Ambulatory Visit (INDEPENDENT_AMBULATORY_CARE_PROVIDER_SITE_OTHER): Payer: 59 | Admitting: Clinical

## 2023-01-23 DIAGNOSIS — F3289 Other specified depressive episodes: Secondary | ICD-10-CM

## 2023-01-23 DIAGNOSIS — F419 Anxiety disorder, unspecified: Secondary | ICD-10-CM

## 2023-01-25 NOTE — Progress Notes (Signed)
Integrated Behavioral Health Progress Note  01/25/2023 Name: Sharon Chan MRN: [id number] DOB: Oct 26, 1976 [name] is a 47 y.o. year old female who sees Fenton Foy, NP for primary care. LCSW was initially consulted to assist with mental health concerns.  Interpreter: No.   Interpreter Name & Language: none  Assessment: Patient experiencing depression and anxiety, exacerbated by physical health concerns.  Ongoing Intervention: CSW and patient checked in over the phone. Her disability and Medicaid have been stopped. She is working with Legal Aid to appeal this. Legal Aid is also helping with a housing issue, as her landlord unlawfully evicted her without going through the court.   Processed thoughts and emotions around really significant family conflict, as patient is caring for her father and his family are treating her poorly. Discussed communication styles and limits to personal responsibility over others' actions. Reflected on need for self care and how to increase personal care activities.  Review of patient status, including review of consultants reports, relevant laboratory and other test results, and collaboration with appropriate care team members and the patient's provider was performed as part of comprehensive patient evaluation and provision of services.    Estanislado Emms, [facility] [phone]

## 2023-01-26 NOTE — Telephone Encounter (Signed)
Pharmacy was called to advise of medication. [place]

## 2023-02-09 ENCOUNTER — Ambulatory Visit: Payer: Medicaid Other | Admitting: Clinical

## 2023-02-09 DIAGNOSIS — F419 Anxiety disorder, unspecified: Secondary | ICD-10-CM

## 2023-02-09 DIAGNOSIS — F3289 Other specified depressive episodes: Secondary | ICD-10-CM

## 2023-02-09 NOTE — Progress Notes (Signed)
Integrated Behavioral Health Progress Note  02/09/2023 Name: Sharon Chan MRN: [id number] DOB: [birth year] [name] is a 47 y.o. year old female who sees Fenton Foy, NP for primary care. LCSW was initially consulted to assist with mental health concerns.  Interpreter: No.   Interpreter Name & Language: none  Assessment: Patient experiencing depression and anxiety, exacerbated by physical health concerns.  Ongoing Intervention: CSW and patient spoke over the phone today. She reported her Medicaid is reinstated. She continues to work with Legal Aid on reinstating her social security disability. She reported some GI symptoms including pain in her side and trouble passing gas. Advised patient to go to urgent care if symptoms continue. Also messaged PCP about moving up patient's next follow up appointment and/or referring to GI.   Supportive counseling provided today around how patient is coping with dynamics with her "dad's" family. 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 She is experiencing challenging interpersonal dynamics with his family as he ages and requires care. Emotional validation and reflective listening provided today. Discussed the need for patient to also engage in self care and prioritize her health as well. Discussed holding boundaries with dad and his family.  Estanislado Emms, [facility] [phone]

## 2023-02-13 ENCOUNTER — Encounter: Payer: Self-pay | Admitting: Physician Assistant

## 2023-02-13 ENCOUNTER — Ambulatory Visit (INDEPENDENT_AMBULATORY_CARE_PROVIDER_SITE_OTHER): Payer: Medicare Other | Admitting: Nurse Practitioner

## 2023-02-13 ENCOUNTER — Encounter: Payer: Self-pay | Admitting: Nurse Practitioner

## 2023-02-13 VITALS — BP 146/82 | HR 62 | Temp 97.5°F | Ht 63.0 in | Wt 211.4 lb

## 2023-02-13 DIAGNOSIS — R1031 Right lower quadrant pain: Secondary | ICD-10-CM | POA: Diagnosis not present

## 2023-02-13 DIAGNOSIS — E119 Type 2 diabetes mellitus without complications: Secondary | ICD-10-CM | POA: Diagnosis not present

## 2023-02-13 LAB — POCT GLYCOSYLATED HEMOGLOBIN (HGB A1C): Hemoglobin A1C: 5.6 % (ref 4.0–5.6)

## 2023-02-13 NOTE — Assessment & Plan Note (Signed)
-   Ambulatory referral to Ophthalmology - POCT glycosylated hemoglobin (Hb A1C) - Microalbumin / creatinine urine ratio   2. Right lower quadrant pain  - US Abdomen Complete   Follow up:  Follow up in 3 months

## 2023-02-13 NOTE — Progress Notes (Signed)
@Patient  ID: Sharon Chan, female    DOB: [birth year], 47 y.o.   MRN: [id number]  Chief Complaint  Patient presents with   Abdominal Pain    Pt is experiencing abdominal pain for the past 6 month would like GI referral.    Referring provider: Fenton Foy, NP   HPI  Sharon Chan is a 47 y.o. female with past medical history of type 2 diabetes without complication, peripheral neuropathy, polycystic kidney disease, stroke, essential hypertension, sickle cell trait, B12 nutritional deficiency presents for follow-up for hypertension.     Hypertension.  Currently on amlodipine 5 mg daily, valsartan-hydrochlorothiazide 320 - 12.5 mg 1 tablet daily, and Amlodipine. Patient reports blood pressure readings has been around 118 over 80s at home.  She denies dizziness, chest pain, syncope. She reports chronic HA, some bilateral lower extremities edema  Right lower abdominal pain:  Patient does complain today of right lower quadrant abdominal pain.  She states that this has been going on since December.  She states that her last bowel movement was 3 days ago.  She states that the pain is becoming progressively worse.  We will order abdominal ultrasound and refer patient to GI.  Denies f/c/s, n/v/d, hemoptysis, PND, leg swelling Denies chest pain or edema      Allergies  Allergen Reactions   Penicillins Anaphylaxis   Other Itching, Rash and Other (See Comments)    Strong sunlight - polymorphous light eruption    Immunization History  Administered Date(s) Administered   Influenza,inj,Quad PF,6+ Mos 09/12/2017, 10/01/2018, 11/20/2019, 10/18/2022   PFIZER(Purple Top)SARS-COV-2 Vaccination 02/06/2020, 02/25/2020    Past Medical History:  Diagnosis Date   Anxiety    CVA (cerebral vascular accident)    Depression    DM type 2 (diabetes mellitus, type 2)    HLD (hyperlipidemia)    HTN (hypertension)    Joint pain    Migraines    Neck pain    Obese    Polycystic  disease, ovaries    Right sided weakness    Seizures    Shoulder pain    Sickle cell trait    Thyroid nodule    Vitamin D deficiency 01/2020    Tobacco History: Social History   Tobacco Use  Smoking Status Never  Smokeless Tobacco Never   Counseling given: Not Answered   Outpatient Encounter Medications as of 02/13/2023  Medication Sig   albuterol (VENTOLIN HFA) 108 (90 Base) MCG/ACT inhaler Inhale 2 puffs into the lungs every 6 (six) hours as needed for wheezing or shortness of breath.   amLODipine (NORVASC) 5 MG tablet Take 1 tablet (5 mg total) by mouth daily.   aspirin EC 81 MG tablet Take 81 mg by mouth in the morning.   atorvastatin (LIPITOR) 40 MG tablet Take 1 tablet (40 mg total) by mouth daily.   benzonatate (TESSALON) 200 MG capsule Take 1 capsule (200 mg total) by mouth 2 (two) times daily as needed for cough.   blood glucose meter kit and supplies KIT Dispense based on patient and insurance preference. Use up to four times daily as directed. (FOR ICD-9 250.00, 250.01).   cyclobenzaprine (FLEXERIL) 5 MG tablet TAKE ONE TABLET BY MOUTH THREE TIMES DAILY AS NEEDED (VIAL)   diphenhydrAMINE (BENADRYL) 25 MG tablet Take 25 mg by mouth every 6 (six) hours as needed for allergies.   divalproex (DEPAKOTE ER) 500 MG 24 hr tablet Take 1 tablet twice a day   eszopiclone (LUNESTA) 1 MG TABS  tablet Take 1 tablet (1 mg total) by mouth at bedtime as needed for sleep. Take immediately before bedtime   nortriptyline (PAMELOR) 25 MG capsule Take 3 capsules every night   OneTouch Delica Lancets [id number] MISC TES 2 (TWO) TIMES DAILY.   Semaglutide,0.25 or 0.5MG /DOS, (OZEMPIC, 0.25 OR 0.5 MG/DOSE,) 2 MG/3ML SOPN Inject 0.25 mg weekly   topiramate (TOPAMAX) 100 MG tablet TAKE 1 TABLET IN MORNING AND 2 TABLETS AT NIGHT   valsartan-hydrochlorothiazide (DIOVAN-HCT) 320-12.5 MG tablet Take 1 tablet by mouth daily.   metFORMIN (GLUCOPHAGE) 500 MG tablet TAKE ONE TABLET BY MOUTH TWICE DAILY @ 9AM & 5PM  (Patient not taking: Reported on 02/13/2023)   No facility-administered encounter medications on file as of 02/13/2023.     Review of Systems  Review of Systems  Constitutional: Negative.   HENT: Negative.    Cardiovascular: Negative.   Gastrointestinal: Negative.   Allergic/Immunologic: Negative.   Neurological: Negative.   Psychiatric/Behavioral: Negative.         Physical Exam  BP (!) 146/82   Pulse 62   Temp (!) 97.5 F (36.4 C)   Ht 5\' 3"  (1.6 m)   Wt 211 lb 6.4 oz (95.9 kg)   LMP 02/06/2023 (Approximate)   SpO2 100%   BMI 37.45 kg/m   Wt Readings from Last 5 Encounters:  02/13/23 211 lb 6.4 oz (95.9 kg)  01/13/23 219 lb (99.3 kg)  12/22/22 219 lb (99.3 kg)  10/18/22 219 lb (99.3 kg)  09/27/22 228 lb (103.4 kg)     Physical Exam Vitals and nursing note reviewed.  Constitutional:      General: She is not in acute distress.    Appearance: She is well-developed.  Cardiovascular:     Rate and Rhythm: Normal rate and regular rhythm.  Pulmonary:     Effort: Pulmonary effort is normal.     Breath sounds: Normal breath sounds.  Neurological:     Mental Status: She is alert and oriented to person, place, and time.       Assessment & Plan:   Type 2 diabetes mellitus without complication, without long-term current use of insulin ([place]) - Ambulatory referral to Ophthalmology - POCT glycosylated hemoglobin (Hb A1C) - Microalbumin / creatinine urine ratio   2. Right lower quadrant pain  - US Abdomen Complete   Follow up:  Follow up in 3 months     Fenton Foy, NP 02/13/2023

## 2023-02-13 NOTE — Patient Instructions (Signed)
1. Type 2 diabetes mellitus without complication, without long-term current use of insulin  - Ambulatory referral to Ophthalmology - POCT glycosylated hemoglobin (Hb A1C) - Microalbumin / creatinine urine ratio   2. Right lower quadrant pain  - US Abdomen Complete   Follow up:  Follow up in 3 months

## 2023-02-14 LAB — COMPREHENSIVE METABOLIC PANEL
ALT: 9 IU/L (ref 0–32)
AST: 14 IU/L (ref 0–40)
Albumin/Globulin Ratio: 1.7 (ref 1.2–2.2)
Albumin: 3.8 g/dL — ABNORMAL LOW (ref 3.9–4.9)
Alkaline Phosphatase: 71 IU/L (ref 44–121)
BUN/Creatinine Ratio: 13 (ref 9–23)
BUN: 9 mg/dL (ref 6–24)
Bilirubin Total: 0.3 mg/dL (ref 0.0–1.2)
CO2: 24 mmol/L (ref 20–29)
Calcium: 8.9 mg/dL (ref 8.7–10.2)
Chloride: 105 mmol/L (ref 96–106)
Creatinine, Ser: 0.72 mg/dL (ref 0.57–1.00)
Globulin, Total: 2.3 g/dL (ref 1.5–4.5)
Glucose: 105 mg/dL — ABNORMAL HIGH (ref 70–99)
Potassium: 3.7 mmol/L (ref 3.5–5.2)
Sodium: 144 mmol/L (ref 134–144)
Total Protein: 6.1 g/dL (ref 6.0–8.5)
eGFR: 102 mL/min/{1.73_m2} (ref >59)

## 2023-02-14 LAB — MICROALBUMIN / CREATININE URINE RATIO
Creatinine, Urine: 173.9 mg/dL
Microalb/Creat Ratio: 6 mg/g creat (ref 0–29)
Microalbumin, Urine: 10.5 ug/mL

## 2023-02-14 LAB — CBC
Hematocrit: 37.4 % (ref 34.0–46.6)
Hemoglobin: 11.9 g/dL (ref 11.1–15.9)
MCH: 24.5 pg — ABNORMAL LOW (ref 26.6–33.0)
MCHC: 31.8 g/dL (ref 31.5–35.7)
MCV: 77 fL — ABNORMAL LOW (ref 79–97)
Platelets: 273 10*3/uL (ref 150–450)
RBC: 4.85 x10E6/uL (ref 3.77–5.28)
RDW: 16.4 % — ABNORMAL HIGH (ref 11.7–15.4)
WBC: 4.9 10*3/uL (ref 3.4–10.8)

## 2023-02-15 ENCOUNTER — Ambulatory Visit (HOSPITAL_COMMUNITY)
Admission: RE | Admit: 2023-02-15 | Discharge: 2023-02-15 | Disposition: A | Discharge status: Home / Self Care | Payer: Medicare Other | Source: Ambulatory Visit | Attending: Nurse Practitioner | Admitting: Nurse Practitioner

## 2023-02-15 DIAGNOSIS — R1031 Right lower quadrant pain: Secondary | ICD-10-CM | POA: Diagnosis not present

## 2023-02-15 DIAGNOSIS — R109 Unspecified abdominal pain: Secondary | ICD-10-CM | POA: Diagnosis not present

## 2023-03-03 ENCOUNTER — Ambulatory Visit: Payer: Medicaid Other | Admitting: Nurse Practitioner

## 2023-03-07 ENCOUNTER — Ambulatory Visit: Payer: Medicare Other | Admitting: Nurse Practitioner

## 2023-03-09 ENCOUNTER — Ambulatory Visit (INDEPENDENT_AMBULATORY_CARE_PROVIDER_SITE_OTHER): Payer: Medicare Other | Admitting: Clinical

## 2023-03-09 ENCOUNTER — Other Ambulatory Visit: Payer: Self-pay

## 2023-03-09 ENCOUNTER — Telehealth (HOSPITAL_BASED_OUTPATIENT_CLINIC_OR_DEPARTMENT_OTHER): Payer: Self-pay | Admitting: *Deleted

## 2023-03-09 ENCOUNTER — Other Ambulatory Visit: Payer: 59 | Admitting: Pharmacist

## 2023-03-09 DIAGNOSIS — F3289 Other specified depressive episodes: Secondary | ICD-10-CM

## 2023-03-09 DIAGNOSIS — I10 Essential (primary) hypertension: Secondary | ICD-10-CM

## 2023-03-09 DIAGNOSIS — F419 Anxiety disorder, unspecified: Secondary | ICD-10-CM

## 2023-03-09 MED ORDER — OZEMPIC (0.25 OR 0.5 MG/DOSE) 2 MG/3ML ~~LOC~~ SOPN
PEN_INJECTOR | SUBCUTANEOUS | 1 refills | Status: DC
Start: 2023-03-09 — End: 2023-03-16

## 2023-03-09 NOTE — Progress Notes (Signed)
Integrated Behavioral Health Progress Note  03/09/23 Name: Sharon Chan MRN: [id number] DOB: 09-23-1976 Sharon Chan is a 47 y.o. year old female who sees Ivonne Andrew, NP for primary care. LCSW was initially consulted to assist with mental health concerns.  Interpreter: No.   Interpreter Name & Language: none  Assessment: Patient experiencing depression and anxiety, exacerbated by physical health concerns.  Ongoing Intervention: CSW and patient spoke over the phone. Patient continues to have challenges with her own health and with helping to care for her dad. She has had several falls recently. She has an appointment with her PCP here on Monday. She also has a neurology appointment upcoming. Supportive counseling provided around coping with health challenges. Emotional validation and reflective listening provided. Follow up IBH appointment scheduled for 03/21/23.  Abigail Butts, LCSW [facility] [phone]

## 2023-03-09 NOTE — Progress Notes (Signed)
03/09/2023 Name: Sharon Chan MRN: [id number] DOB: 06/13/1976  Chief Complaint  Patient presents with   Medication Management   Diabetes   Hypertension   Hyperlipidemia    Sharon Chan is a 47 y.o. year old female who presented for a telephone visit.   They were referred to the pharmacist by their PCP for assistance in managing diabetes, hypertension, and hyperlipidemia.    Subjective:  Care Team: Primary Care Provider: Ivonne Andrew, NP ; Next Scheduled Visit: 03/13/23 Neurologist: Karel Jarvis; Next Scheduled Visit: 04/18/23 Pulmonologist: Allison Quarry; Next Scheduled Visit: 03/20/23 GI: Zerita Boers; Next Scheduled Visit: 03/28/23  Medication Access/Adherence  Current Pharmacy:  [facility] - [facility] [address] [place] [address] Phone: [phone] Fax: [phone]  [id number] [place], [place] - [address] AT [facility] [address] [place] [address] Phone: [phone] Fax: [phone]   Patient reports affordability concerns with their medications: No  Patient reports access/transportation concerns to their pharmacy: No  Patient reports adherence concerns with their medications:  No     Diabetes:  Current medications: reports she stopped metformin a few months ago. Has been completing Victoza supply, so has not started Ozempic yet.   Hypertension:  Current medications: valsartan/HCTZ 320/12.5 mg daily, amlodipine 5 mg daily  Patient has a validated, automated, upper arm home BP cuff Current blood pressure readings readings: reports she has been on her period this week and BP have been elevated.    Hyperlipidemia/ASCVD Risk Reduction  Current lipid lowering medications: atorvastatin 40 mg daily   Objective:  Lab Results  Component Value Date   HGBA1C 5.6 02/13/2023    Lab Results  Component Value Date   CREATININE 0.72 02/13/2023   BUN 9 02/13/2023   NA 144  02/13/2023   K 3.7 02/13/2023   CL 105 02/13/2023   CO2 24 02/13/2023    Lab Results  Component Value Date   CHOL 199 03/02/2022   HDL 49 03/02/2022   LDLCALC 138 (H) 03/02/2022   TRIG 66 03/02/2022   CHOLHDL 4.1 03/02/2022    Medications Reviewed Today     Reviewed by Alden Hipp, RPH-CPP (Pharmacist) on 03/09/23 at 1511  Med List Status: <None>   Medication Order Taking? Sig Documenting Provider Last Dose Status Informant  albuterol (VENTOLIN HFA) 108 (90 Base) MCG/ACT inhaler [id number]  Inhale 2 puffs into the lungs every 6 (six) hours as needed for wheezing or shortness of breath. Ivonne Andrew, NP  Active   amLODipine (NORVASC) 5 MG tablet [id number] Yes Take 1 tablet (5 mg total) by mouth daily. Quentin Angst, MD Taking Active   aspirin EC 81 MG tablet [id number] Yes Take 81 mg by mouth in the morning. [provider] Taking Active Self  atorvastatin (LIPITOR) 40 MG tablet [id number] Yes Take 1 tablet (40 mg total) by mouth daily. Ivonne Andrew, NP Taking Active Pharmacy Records, Self  benzonatate (TESSALON) 200 MG capsule [id number] Yes Take 1 capsule (200 mg total) by mouth 2 (two) times daily as needed for cough. Ivonne Andrew, NP Taking Active   blood glucose meter kit and supplies KIT [id number] Yes Dispense based on patient and insurance preference. Use up to four times daily as directed. (FOR ICD-9 250.00, 250.01). Wilder Glade, MD Taking Active Pharmacy Records, Self  cyclobenzaprine (FLEXERIL) 5 MG tablet [id number] Yes TAKE ONE TABLET BY MOUTH THREE TIMES DAILY AS NEEDED (VIAL) Ivonne Andrew,  NP Taking Active   diphenhydrAMINE (BENADRYL) 25 MG tablet [id number] Yes Take 25 mg by mouth every 6 (six) hours as needed for allergies. [provider] Taking Active Pharmacy Records, Self           Med Note (COFFELL, Foye Spurling Sep 05, 2022  9:31 AM)    divalproex (DEPAKOTE ER) 500 MG 24 hr tablet [id number] Yes Take 1 tablet twice a  day Van Clines, MD Taking Active   eszopiclone (LUNESTA) 1 MG TABS tablet [id number] Yes Take 1 tablet (1 mg total) by mouth at bedtime as needed for sleep. Take immediately before bedtime Cobb, Ruby Cola, NP Taking Active Pharmacy Records, Self  nortriptyline (PAMELOR) 25 MG capsule [id number] Yes Take 3 capsules every night Van Clines, MD Taking Active   OneTouch Delica Lancets 33G MISC [id number] Yes TES 2 (TWO) TIMES DAILY. Kallie Locks, FNP Taking Active Pharmacy Records, Self  Semaglutide,0.25 or 0.5MG /DOS, (OZEMPIC, 0.25 OR 0.5 MG/DOSE,) 2 MG/3ML SOPN [id number] Yes Inject 0.25 mg weekly for 4 weeks, then increase to 0.5 mg weekly Ivonne Andrew, NP Taking Active   topiramate (TOPAMAX) 100 MG tablet [id number] Yes TAKE 1 TABLET IN MORNING AND 2 TABLETS AT Patricia Nettle, MD Taking Active   valsartan-hydrochlorothiazide (DIOVAN-HCT) 320-12.5 MG tablet [id number] Yes Take 1 tablet by mouth daily. Ivonne Andrew, NP Taking Active               Assessment/Plan:   Diabetes: - Currently controlled - Reviewed injection technique for Ozempic.  - Given sickle cell trait and lowered A1c (in the setting of metformin discontinuation), consider fructosamine for alternative glycemic measurement.  Hypertension: - Currently uncontrolled - Collaborated with OBGYN staff to have patient scheduled with Dr. Shawnie Pons.  - Recommended to continue current regimen at this time - Continue prior plan regarding medication packaging from [facility]  Hyperlipidemia/ASCVD Risk Reduction: - Currently uncontrolled but due for updated lipid panel - If next A1c >70%, recommend increasing atorvastatin to 80 mg daily   Follow Up Plan: phone call in 4 weeks  Catie Eppie Gibson, PharmD, BCACP, CPP [facility] [phone]

## 2023-03-09 NOTE — Patient Instructions (Signed)
Kylinn,   It was great talking to you today!  Start Ozempic 0.25 mg weekly for 4 weeks. On week 5, you can increase to 0.5 mg weekly.   We'll work to get you in with the GYN.   I'll touch base with the adherence packaging team to see if they need refills from [name].   Thanks!  Catie Eppie Gibson, PharmD, BCACP, CPP [facility] [phone]

## 2023-03-09 NOTE — Telephone Encounter (Signed)
Called patient and left a message to call the office back to schedule the appointment x2

## 2023-03-13 ENCOUNTER — Ambulatory Visit (INDEPENDENT_AMBULATORY_CARE_PROVIDER_SITE_OTHER): Payer: Medicare Other | Admitting: Nurse Practitioner

## 2023-03-13 ENCOUNTER — Ambulatory Visit (HOSPITAL_COMMUNITY)
Admission: RE | Admit: 2023-03-13 | Discharge: 2023-03-13 | Disposition: A | Discharge status: Home / Self Care | Payer: Medicare Other | Source: Ambulatory Visit | Attending: Nurse Practitioner | Admitting: Nurse Practitioner

## 2023-03-13 ENCOUNTER — Encounter: Payer: Self-pay | Admitting: Nurse Practitioner

## 2023-03-13 VITALS — BP 162/99 | HR 94 | Temp 97.1°F | Wt 208.6 lb

## 2023-03-13 DIAGNOSIS — R296 Repeated falls: Secondary | ICD-10-CM

## 2023-03-13 DIAGNOSIS — I10 Essential (primary) hypertension: Secondary | ICD-10-CM | POA: Diagnosis not present

## 2023-03-13 DIAGNOSIS — R2990 Unspecified symptoms and signs involving the nervous system: Secondary | ICD-10-CM | POA: Insufficient documentation

## 2023-03-13 DIAGNOSIS — Z13220 Encounter for screening for lipoid disorders: Secondary | ICD-10-CM | POA: Diagnosis not present

## 2023-03-13 DIAGNOSIS — I639 Cerebral infarction, unspecified: Secondary | ICD-10-CM | POA: Diagnosis not present

## 2023-03-13 MED ORDER — AMLODIPINE BESYLATE 10 MG PO TABS: 10.0000 mg | ORAL_TABLET | Freq: Every day | ORAL | 11 refills | Status: DC

## 2023-03-13 MED ORDER — CLONIDINE HCL 0.1 MG PO TABS
0.1000 mg | ORAL_TABLET | Freq: Once | ORAL | Status: AC
Start: 2023-03-13 — End: 2023-03-13
  Administered 2023-03-13: 0.1 mg via ORAL

## 2023-03-13 NOTE — Patient Instructions (Signed)
1. Benign essential HTN  - CBC - Comprehensive metabolic panel - MR Brain Wo Contrast  2. Lipid screening  - Lipid Panel  3. Multiple falls - MR Brain Wo Contrast  4. Stroke-like symptoms  - MR Brain Wo Contrast   Follow up:  Follow up in 3 months

## 2023-03-13 NOTE — Progress Notes (Signed)
@Patient  ID: [name], female    DOB: 1976/03/27, 47 y.o.   MRN: [id number]  Chief Complaint  Patient presents with   Diabetes    Referring provider: Ivonne Andrew, NP   HPI  Sharon Chan is a 47 y.o. female with past medical history of type 2 diabetes without complication, peripheral neuropathy, polycystic kidney disease, stroke, essential hypertension, sickle cell trait, B12 nutritional deficiency presents for follow-up for hypertension.     Hypertension.  Currently on amlodipine 5 mg daily, valsartan-hydrochlorothiazide 320 - 12.5 mg 1 tablet daily, and Amlodipine. Patient reports that blood pressures have been high at home.  Blood pressure is high in office today.  We will give a clonidine in office today.  We will increase amlodipine to 10 mg daily.  Pharmacist is following with patient for medication management for hypertension.  She denies dizziness, chest pain, syncope. She reports chronic HA, some bilateral lower extremities edema  Patient reports today that she has been having multiple falls along with high blood pressure.  She is concerned that she may be having another stroke.  We did contact her neurologist during the office visit today.  Her neurologist cannot see her today or tomorrow to ask if we could order an MRI.  We will order this today.  Denies f/c/s, n/v/d, hemoptysis, PND, leg swelling Denies chest pain or edema       Allergies  Allergen Reactions   Penicillins Anaphylaxis   Other Itching, Rash and Other (See Comments)    Strong sunlight - polymorphous light eruption    Immunization History  Administered Date(s) Administered   Influenza,inj,Quad PF,6+ Mos 09/12/2017, 10/01/2018, 11/20/2019, 10/18/2022   PFIZER(Purple Top)SARS-COV-2 Vaccination 02/06/2020, 02/25/2020    Past Medical History:  Diagnosis Date   Anxiety    CVA (cerebral vascular accident) (HCC)    Depression    DM type 2 (diabetes mellitus, type 2) (HCC)     HLD (hyperlipidemia)    HTN (hypertension)    Joint pain    Migraines    Neck pain    Obese    Polycystic disease, ovaries    Right sided weakness    Seizures (HCC)    Shoulder pain    Sickle cell trait (HCC)    Thyroid nodule    Vitamin D deficiency 01/2020    Tobacco History: Social History   Tobacco Use  Smoking Status Never  Smokeless Tobacco Never   Counseling given: Not Answered   Outpatient Encounter Medications as of 03/13/2023  Medication Sig   amLODipine (NORVASC) 10 MG tablet Take 1 tablet (10 mg total) by mouth daily.   albuterol (VENTOLIN HFA) 108 (90 Base) MCG/ACT inhaler Inhale 2 puffs into the lungs every 6 (six) hours as needed for wheezing or shortness of breath.   aspirin EC 81 MG tablet Take 81 mg by mouth in the morning.   atorvastatin (LIPITOR) 40 MG tablet Take 1 tablet (40 mg total) by mouth daily.   benzonatate (TESSALON) 200 MG capsule Take 1 capsule (200 mg total) by mouth 2 (two) times daily as needed for cough.   blood glucose meter kit and supplies KIT Dispense based on patient and insurance preference. Use up to four times daily as directed. (FOR ICD-9 250.00, 250.01).   cyclobenzaprine (FLEXERIL) 5 MG tablet TAKE ONE TABLET BY MOUTH THREE TIMES DAILY AS NEEDED (VIAL)   diphenhydrAMINE (BENADRYL) 25 MG tablet Take 25 mg by mouth every 6 (six) hours as needed for allergies.  divalproex (DEPAKOTE ER) 500 MG 24 hr tablet Take 1 tablet twice a day   eszopiclone (LUNESTA) 1 MG TABS tablet Take 1 tablet (1 mg total) by mouth at bedtime as needed for sleep. Take immediately before bedtime   nortriptyline (PAMELOR) 25 MG capsule Take 3 capsules every night   OneTouch Delica Lancets 33G MISC TES 2 (TWO) TIMES DAILY.   Semaglutide,0.25 or 0.5MG /DOS, (OZEMPIC, 0.25 OR 0.5 MG/DOSE,) 2 MG/3ML SOPN Inject 0.25 mg weekly for 4 weeks, then increase to 0.5 mg weekly   topiramate (TOPAMAX) 100 MG tablet TAKE 1 TABLET IN MORNING AND 2 TABLETS AT NIGHT    valsartan-hydrochlorothiazide (DIOVAN-HCT) 320-12.5 MG tablet Take 1 tablet by mouth daily.   [DISCONTINUED] amLODipine (NORVASC) 5 MG tablet Take 1 tablet (5 mg total) by mouth daily.   [EXPIRED] cloNIDine (CATAPRES) tablet 0.1 mg    No facility-administered encounter medications on file as of 03/13/2023.     Review of Systems  Review of Systems  Constitutional: Negative.   HENT: Negative.    Cardiovascular: Negative.   Gastrointestinal: Negative.   Allergic/Immunologic: Negative.   Neurological: Negative.   Psychiatric/Behavioral: Negative.         Physical Exam  BP (!) 162/99   Pulse 94   Temp (!) 97.1 F (36.2 C)   Wt 208 lb 9.6 oz (94.6 kg)   LMP 02/06/2023 (Approximate)   SpO2 100%   BMI 36.95 kg/m   Wt Readings from Last 5 Encounters:  03/13/23 208 lb 9.6 oz (94.6 kg)  02/13/23 211 lb 6.4 oz (95.9 kg)  01/13/23 219 lb (99.3 kg)  12/22/22 219 lb (99.3 kg)  10/18/22 219 lb (99.3 kg)     Physical Exam Vitals and nursing note reviewed.  Constitutional:      General: She is not in acute distress.    Appearance: She is well-developed.  Cardiovascular:     Rate and Rhythm: Normal rate and regular rhythm.  Pulmonary:     Effort: Pulmonary effort is normal.     Breath sounds: Normal breath sounds.  Neurological:     Mental Status: She is alert and oriented to person, place, and time.      Lab Results:  CBC    Component Value Date/Time   WBC 4.9 02/13/2023 1114   WBC 5.7 09/05/2022 1029   RBC 4.85 02/13/2023 1114   RBC 4.76 09/05/2022 1029   HGB 11.9 02/13/2023 1114   HCT 37.4 02/13/2023 1114   PLT 273 02/13/2023 1114   MCV 77 (L) 02/13/2023 1114   MCH 24.5 (L) 02/13/2023 1114   MCH 25.0 (L) 09/05/2022 1029   MCHC 31.8 02/13/2023 1114   MCHC 33.7 09/05/2022 1029   RDW 16.4 (H) 02/13/2023 1114   LYMPHSABS 2.2 09/05/2022 1029   LYMPHSABS 2.7 02/03/2020 1412   MONOABS 0.5 09/05/2022 1029   EOSABS 0.1 09/05/2022 1029   EOSABS 0.2 02/03/2020  1412   BASOSABS 0.1 09/05/2022 1029   BASOSABS 0.1 02/03/2020 1412    BMET    Component Value Date/Time   NA 144 02/13/2023 1114   K 3.7 02/13/2023 1114   CL 105 02/13/2023 1114   CO2 24 02/13/2023 1114   GLUCOSE 105 (H) 02/13/2023 1114   GLUCOSE 120 (H) 09/05/2022 1029   BUN 9 02/13/2023 1114   CREATININE 0.72 02/13/2023 1114   CREATININE 0.92 09/12/2017 1121   CALCIUM 8.9 02/13/2023 1114   GFRNONAA >60 09/05/2022 1029   GFRNONAA 71 03/17/2017 1513   GFRAA 83  02/03/2020 1412   GFRAA 82 03/17/2017 1513    BNP No results found for: "BNP"  ProBNP No results found for: "PROBNP"  Imaging: US Abdomen Complete  Result Date: 02/15/2023 CLINICAL DATA:  Right abdominal pain over the last 2-3 months EXAM: ABDOMEN ULTRASOUND COMPLETE COMPARISON:  None Available. FINDINGS: Gallbladder: Previous cholecystectomy. Common bile duct: Diameter: 5 mm.  Normal. Liver: No focal lesion identified. Within normal limits in parenchymal echogenicity. Portal vein is patent on color Doppler imaging with normal direction of blood flow towards the liver. IVC: No abnormality visualized. Pancreas: Visualized portion unremarkable. Spleen: Size and appearance within normal limits. Right Kidney: Length: 10.4 cm. Echogenicity within normal limits. No mass or hydronephrosis visualized. Left Kidney: Length: 10.3 cm. Echogenicity within normal limits. No mass or hydronephrosis visualized. Abdominal aorta: No aneurysm visualized. Other findings: No ascites.  No discernible bowel lesion. IMPRESSION: Normal abdominal ultrasound. Previous cholecystectomy. No abnormality seen to explain the patient's symptoms. Electronically Signed   By: Paulina Fusi M.D.   On: 02/15/2023 09:11     Assessment & Plan:   Benign essential HTN - CBC - Comprehensive metabolic panel - MR Brain Wo Contrast  2. Lipid screening  - Lipid Panel  3. Multiple falls - MR Brain Wo Contrast  4. Stroke-like symptoms  - MR Brain Wo  Contrast   Follow up:  Follow up in 3 months     Ivonne Andrew, NP 03/13/2023

## 2023-03-13 NOTE — Assessment & Plan Note (Signed)
-   CBC - Comprehensive metabolic panel - MR Brain Wo Contrast  2. Lipid screening  - Lipid Panel  3. Multiple falls - MR Brain Wo Contrast  4. Stroke-like symptoms  - MR Brain Wo Contrast   Follow up:  Follow up in 3 months

## 2023-03-14 ENCOUNTER — Other Ambulatory Visit: Payer: Self-pay

## 2023-03-14 LAB — COMPREHENSIVE METABOLIC PANEL
ALT: 11 IU/L (ref 0–32)
AST: 12 IU/L (ref 0–40)
Albumin/Globulin Ratio: 2 (ref 1.2–2.2)
Albumin: 4.1 g/dL (ref 3.9–4.9)
Alkaline Phosphatase: 86 IU/L (ref 44–121)
BUN/Creatinine Ratio: 11 (ref 9–23)
BUN: 10 mg/dL (ref 6–24)
Bilirubin Total: 0.3 mg/dL (ref 0.0–1.2)
CO2: 24 mmol/L (ref 20–29)
Calcium: 9.5 mg/dL (ref 8.7–10.2)
Chloride: 102 mmol/L (ref 96–106)
Creatinine, Ser: 0.87 mg/dL (ref 0.57–1.00)
Globulin, Total: 2.1 g/dL (ref 1.5–4.5)
Glucose: 92 mg/dL (ref 70–99)
Potassium: 4.1 mmol/L (ref 3.5–5.2)
Sodium: 142 mmol/L (ref 134–144)
Total Protein: 6.2 g/dL (ref 6.0–8.5)
eGFR: 82 mL/min/{1.73_m2} (ref >59)

## 2023-03-14 LAB — CBC
Hematocrit: 40.6 % (ref 34.0–46.6)
Hemoglobin: 12.6 g/dL (ref 11.1–15.9)
MCH: 23.7 pg — ABNORMAL LOW (ref 26.6–33.0)
MCHC: 31 g/dL — ABNORMAL LOW (ref 31.5–35.7)
MCV: 77 fL — ABNORMAL LOW (ref 79–97)
Platelets: 306 10*3/uL (ref 150–450)
RBC: 5.31 x10E6/uL — ABNORMAL HIGH (ref 3.77–5.28)
RDW: 15.6 % — ABNORMAL HIGH (ref 11.7–15.4)
WBC: 5 10*3/uL (ref 3.4–10.8)

## 2023-03-14 LAB — LIPID PANEL
Chol/HDL Ratio: 3.8 ratio (ref 0.0–4.4)
Cholesterol, Total: 240 mg/dL — ABNORMAL HIGH (ref 100–199)
HDL: 64 mg/dL (ref >39)
LDL Chol Calc (NIH): 167 mg/dL — ABNORMAL HIGH (ref 0–99)
Triglycerides: 57 mg/dL (ref 0–149)
VLDL Cholesterol Cal: 9 mg/dL (ref 5–40)

## 2023-03-15 ENCOUNTER — Other Ambulatory Visit: Payer: Self-pay

## 2023-03-16 ENCOUNTER — Other Ambulatory Visit: Payer: Self-pay

## 2023-03-16 ENCOUNTER — Other Ambulatory Visit: Payer: Self-pay | Admitting: Nurse Practitioner

## 2023-03-16 ENCOUNTER — Other Ambulatory Visit: Payer: 59 | Admitting: Pharmacist

## 2023-03-16 ENCOUNTER — Other Ambulatory Visit (HOSPITAL_COMMUNITY): Payer: Self-pay

## 2023-03-16 DIAGNOSIS — I639 Cerebral infarction, unspecified: Secondary | ICD-10-CM

## 2023-03-16 DIAGNOSIS — I10 Essential (primary) hypertension: Secondary | ICD-10-CM

## 2023-03-16 DIAGNOSIS — E781 Pure hyperglyceridemia: Secondary | ICD-10-CM

## 2023-03-16 MED ORDER — ATORVASTATIN CALCIUM 80 MG PO TABS
80.0000 mg | ORAL_TABLET | Freq: Every day | ORAL | 3 refills | Status: DC
  Filled 2023-03-16 – 2023-03-21 (×4): qty 30, 30d supply, fill #0
  Filled 2023-05-04: qty 30, 30d supply, fill #1
  Filled 2023-06-06 – 2023-08-02 (×3): qty 30, 30d supply, fill #2
  Filled 2023-08-10 – 2023-08-28 (×2): qty 30, 30d supply, fill #3
  Filled 2023-09-20: qty 30, 30d supply, fill #4

## 2023-03-16 MED ORDER — SPIRONOLACTONE 25 MG PO TABS
25.0000 mg | ORAL_TABLET | Freq: Every day | ORAL | 3 refills | Status: DC
  Filled 2023-03-16: qty 90, 90d supply, fill #0

## 2023-03-16 MED ORDER — ASPIRIN 81 MG PO TBEC
81.0000 mg | DELAYED_RELEASE_TABLET | Freq: Every morning | ORAL | 11 refills | Status: AC
  Filled 2023-03-16 – 2023-03-21 (×4): qty 30, 30d supply, fill #0
  Filled 2023-05-04: qty 30, 30d supply, fill #1
  Filled 2023-06-06 – 2023-08-02 (×3): qty 30, 30d supply, fill #2
  Filled 2023-08-28: qty 30, 30d supply, fill #3

## 2023-03-16 MED ORDER — OZEMPIC (0.25 OR 0.5 MG/DOSE) 2 MG/3ML ~~LOC~~ SOPN
PEN_INJECTOR | SUBCUTANEOUS | 1 refills | Status: DC
Start: 2023-03-16 — End: 2023-07-04
  Filled 2023-03-16: qty 3, 42d supply, fill #0

## 2023-03-16 MED ORDER — VALSARTAN-HYDROCHLOROTHIAZIDE 320-12.5 MG PO TABS
1.0000 | ORAL_TABLET | Freq: Every day | ORAL | 11 refills | Status: DC
Start: 2023-03-16 — End: 2023-07-27
  Filled 2023-03-16 – 2023-03-21 (×4): qty 30, 30d supply, fill #0
  Filled 2023-05-04: qty 30, 30d supply, fill #1
  Filled 2023-06-06 – 2023-07-24 (×2): qty 30, 30d supply, fill #2

## 2023-03-16 MED ORDER — AMLODIPINE BESYLATE 10 MG PO TABS
10.0000 mg | ORAL_TABLET | Freq: Every day | ORAL | 11 refills | Status: DC
  Filled 2023-03-16 – 2023-03-21 (×4): qty 30, 30d supply, fill #0

## 2023-03-16 NOTE — Progress Notes (Signed)
03/16/2023 Name: Sharon Chan MRN: [id number] DOB: [birth year]  Chief Complaint  Patient presents with   Medication Management   Hyperlipidemia    Sharon Chan is a 47 y.o. year old female who presented for a telephone visit.   They were referred to the pharmacist by their PCP for assistance in managing hypertension.   Subjective:  Care Team: Primary Care Provider: Ivonne Andrew, NP ; Next Scheduled Visit: 08/16/23 Neurologist: Karel Jarvis; Next Scheduled Visit: 04/18/23  Medication Access/Adherence  Current Pharmacy:  [facility] - [facility] [address] [place] [address] Phone: [phone] Fax: [phone]  [id number] [place], [place] - [address] AT [facility] [address]  [place] [address] Phone: [phone] Fax: [phone]   Patient reports affordability concerns with their medications: No  Patient reports access/transportation concerns to their pharmacy: No  Patient reports adherence concerns with their medications:  No     Diabetes:  Current medications: Ozempic 0.25 mg weekly  Hypertension:  Current medications: valsartan/HCTZ 320/12.5 mg daily, amlodipine 10 mg daily  Patient has a validated, automated, upper arm home BP cuff Current blood pressure readings readings: 170-180s systolics  Patient denies hypotensive s/sx including dizziness, lightheadedness.  Patient reports hypertensive symptoms including headache, hand tingling. MRI brain negative. Patient notes she is on cancellation list for cardiology. GYN tomorrow to discuss elevations in BP around menstrual cycle.    Hyperlipidemia/ASCVD Risk Reduction  Current lipid lowering medications: atorvastatin 40 mg daily  Antiplatelet regimen: aspirin 81 mg daily  History of CVA. Reports that she has been taking atorvastatin daily over the past several weeks.   Objective:  Lab Results  Component Value  Date   HGBA1C 5.6 02/13/2023    Lab Results  Component Value Date   CREATININE 0.87 03/13/2023   BUN 10 03/13/2023   NA 142 03/13/2023   K 4.1 03/13/2023   CL 102 03/13/2023   CO2 24 03/13/2023    Lab Results  Component Value Date   CHOL 240 (H) 03/13/2023   HDL 64 03/13/2023   LDLCALC 167 (H) 03/13/2023   TRIG 57 03/13/2023   CHOLHDL 3.8 03/13/2023    Medications Reviewed Today     Reviewed by Ivonne Andrew, NP (Nurse Practitioner) on 03/13/23 at 1452  Med List Status: <None>   Medication Order Taking? Sig Documenting Provider Last Dose Status Informant  albuterol (VENTOLIN HFA) 108 (90 Base) MCG/ACT inhaler [id number] No Inhale 2 puffs into the lungs every 6 (six) hours as needed for wheezing or shortness of breath. Ivonne Andrew, NP Taking Active   amLODipine (NORVASC) 10 MG tablet [id number] Yes Take 1 tablet (10 mg total) by mouth daily. Ivonne Andrew, NP  Active   aspirin EC 81 MG tablet [id number] No Take 81 mg by mouth in the morning. [provider] Taking Active Self  atorvastatin (LIPITOR) 40 MG tablet [id number] No Take 1 tablet (40 mg total) by mouth daily. Ivonne Andrew, NP Taking Active Pharmacy Records, Self  benzonatate (TESSALON) 200 MG capsule [id number] No Take 1 capsule (200 mg total) by mouth 2 (two) times daily as needed for cough. Ivonne Andrew, NP Taking Active   blood glucose meter kit and supplies KIT [id number] No Dispense based on patient and insurance preference. Use up to four times daily as directed. (FOR ICD-9 250.00, 250.01). Wilder Glade, MD Taking Active Pharmacy Records, Self  cyclobenzaprine [facility]) 5 MG tablet [id number]  No TAKE ONE TABLET BY MOUTH THREE TIMES DAILY AS NEEDED (VIAL) Ivonne Andrew, NP Taking Active   diphenhydrAMINE (BENADRYL) 25 MG tablet [id number] No Take 25 mg by mouth every 6 (six) hours as needed for allergies. [provider] Taking Active Pharmacy Records, Self           Med Note  (COFFELL, Foye Spurling Sep 05, 2022  9:31 AM)    divalproex (DEPAKOTE ER) 500 MG 24 hr tablet [id number] No Take 1 tablet twice a day Van Clines, MD Taking Active   eszopiclone (LUNESTA) 1 MG TABS tablet [id number] No Take 1 tablet (1 mg total) by mouth at bedtime as needed for sleep. Take immediately before bedtime Cobb, Ruby Cola, NP Taking Active Pharmacy Records, Self  nortriptyline (PAMELOR) 25 MG capsule [id number] No Take 3 capsules every night Van Clines, MD Taking Active   OneTouch Delica Lancets 33G MISC [id number] No TES 2 (TWO) TIMES DAILY. Kallie Locks, FNP Taking Active Pharmacy Records, Self  Semaglutide,0.25 or 0.5MG /DOS, (OZEMPIC, 0.25 OR 0.5 MG/DOSE,) 2 MG/3ML SOPN [id number] No Inject 0.25 mg weekly for 4 weeks, then increase to 0.5 mg weekly Ivonne Andrew, NP Taking Active   topiramate (TOPAMAX) 100 MG tablet [id number] No TAKE 1 TABLET IN MORNING AND 2 TABLETS AT Patricia Nettle, MD Taking Active   valsartan-hydrochlorothiazide (DIOVAN-HCT) 320-12.5 MG tablet [id number] No Take 1 tablet by mouth daily. Ivonne Andrew, NP Taking Active               Assessment/Plan:   Diabetes: - Currently controlled - Recommend to continue current regimen at this time   Hypertension: - Currently uncontrolled - Recommend to start spironolactone 25 mg daily. Continue valsartan/HCTZ 320/12.5 mg daily, amlodipine 10 mg daily. BMP in 2 weeks. [facility]. Discussed with PCP, she is in agreement. Orders placed.  - Recommend to follow up with GYN tomorrow regarding cyclical HTN  Hyperlipidemia/ASCVD Risk Reduction: - Currently uncontrolled. Patient endorses adherence - Recommend to increase atorvastatin to 80 mg daily. Discussed with PCP, she is in agreement. Lipids panel in ~ 6 weeks. If LDL remains uncontrolled, will discuss addition of non-statin therapy to target LDL <70  Working with pharmacy on adherence packaging. Will  collaborate with neurology to send refills to [facility] .   Follow Up Plan: phone call in ~3 weeks  Catie Eppie Gibson, PharmD, BCACP, CPP [facility] [phone]

## 2023-03-16 NOTE — Patient Instructions (Signed)
[name],   Start spironolactone 25 mg daily.   Continue to monitor your blood pressures at home.   We are working on setting up the adherence packaging for you.   Take care!  Catie Eppie Gibson, PharmD, BCACP, CPP [facility] [phone]

## 2023-03-17 ENCOUNTER — Ambulatory Visit (INDEPENDENT_AMBULATORY_CARE_PROVIDER_SITE_OTHER): Payer: 59 | Admitting: Family Medicine

## 2023-03-17 ENCOUNTER — Other Ambulatory Visit (HOSPITAL_COMMUNITY)
Admission: RE | Admit: 2023-03-17 | Discharge: 2023-03-17 | Disposition: A | Discharge status: Home / Self Care | Payer: 59 | Source: Ambulatory Visit | Attending: Family Medicine | Admitting: Family Medicine

## 2023-03-17 ENCOUNTER — Other Ambulatory Visit: Payer: Self-pay

## 2023-03-17 ENCOUNTER — Encounter (HOSPITAL_BASED_OUTPATIENT_CLINIC_OR_DEPARTMENT_OTHER): Payer: Self-pay | Admitting: Family Medicine

## 2023-03-17 ENCOUNTER — Other Ambulatory Visit: Payer: Self-pay | Admitting: Neurology

## 2023-03-17 VITALS — BP 148/86 | HR 66 | Ht 63.0 in | Wt 206.0 lb

## 2023-03-17 DIAGNOSIS — R102 Pelvic and perineal pain: Secondary | ICD-10-CM | POA: Insufficient documentation

## 2023-03-17 DIAGNOSIS — I10 Essential (primary) hypertension: Secondary | ICD-10-CM | POA: Diagnosis not present

## 2023-03-17 DIAGNOSIS — Z124 Encounter for screening for malignant neoplasm of cervix: Secondary | ICD-10-CM

## 2023-03-17 DIAGNOSIS — Z01419 Encounter for gynecological examination (general) (routine) without abnormal findings: Secondary | ICD-10-CM

## 2023-03-17 DIAGNOSIS — Z1151 Encounter for screening for human papillomavirus (HPV): Secondary | ICD-10-CM | POA: Diagnosis not present

## 2023-03-17 DIAGNOSIS — R519 Headache, unspecified: Secondary | ICD-10-CM

## 2023-03-17 MED ORDER — DIVALPROEX SODIUM ER 500 MG PO TB24
500.0000 mg | ORAL_TABLET | Freq: Two times a day (BID) | ORAL | 3 refills | Status: DC
Start: 2023-03-17 — End: 2023-05-08
  Filled 2023-03-17: qty 180, 90d supply, fill #0

## 2023-03-17 MED ORDER — TOPIRAMATE 100 MG PO TABS
ORAL_TABLET | ORAL | 3 refills | Status: DC
Start: 2023-03-17 — End: 2023-04-14
  Filled 2023-03-17: qty 270, 90d supply, fill #0

## 2023-03-17 MED ORDER — NORTRIPTYLINE HCL 25 MG PO CAPS
75.0000 mg | ORAL_CAPSULE | Freq: Every evening | ORAL | 3 refills | Status: DC
Start: 2023-03-17 — End: 2023-06-01
  Filled 2023-03-17 – 2023-03-21 (×4): qty 90, 30d supply, fill #0
  Filled 2023-05-04: qty 90, 30d supply, fill #1

## 2023-03-17 NOTE — Progress Notes (Addendum)
Subjective:     Sharon Chan is a 47 y.o. female and is here for a comprehensive physical exam. The patient reports no problems. Seems like her blood pressure goes up with her cycles. She has light cycles, regularly and last 3 days. Denies hot flashes. Has h/o PCOS. Has been 3 years without a cycle but not recently. Has pain in right lower abdomen. It is constant and seems to worsen.   The following portions of the patient's history were reviewed and updated as appropriate: allergies, current medications, past family history, past medical history, past social history, past surgical history, and problem list.  Review of Systems Pertinent items noted in HPI and remainder of comprehensive ROS otherwise negative.   Objective:  Chaperone present for exam   BP (!) 148/86   Pulse 66   Ht 5\' 3"  (1.6 m)   Wt 206 lb (93.4 kg)   LMP 03/12/2023 (Exact Date)   BMI 36.49 kg/m  General appearance: alert, cooperative, and appears stated age Head: Normocephalic, without obvious abnormality, atraumatic Neck: no adenopathy, supple, symmetrical, trachea midline, and thyroid not enlarged, symmetric, no tenderness/mass/nodules Lungs: clear to auscultation bilaterally Breasts: normal appearance, no masses or tenderness Heart: regular rate and rhythm, S1, S2 normal, no murmur, click, rub or gallop Abdomen: soft, non-tender; bowel sounds normal; no masses,  no organomegaly Pelvic: cervix normal in appearance, external genitalia normal, no adnexal masses or tenderness, no cervical motion tenderness, vagina normal without discharge, and uterus likely enlarged though exam is limited by body habitus Extremities: Homans sign is negative, no sign of DVT Pulses: 2+ and symmetric Skin: Skin color, texture, turgor normal. No rashes or lesions Lymph nodes: Cervical, supraclavicular, and axillary nodes normal. Neurologic: Grossly normal    Assessment:    GYN female exam.      Plan:  Screening for  malignant neoplasm of cervix - Plan: Cytology - PAP( [place])  Encounter for gynecological examination without abnormal finding - Plan: Cytology - PAP( )  Essential hypertension - maxed out on 3 agents - DASH diet instructions given  Pelvic pain - check u/s, has appointment with GI. - Plan: US PELVIS TRANSVAGINAL NON-OB (TV ONLY)   Return in 1 year (on 03/16/2024).  See After Visit Summary for Counseling Recommendations

## 2023-03-20 ENCOUNTER — Ambulatory Visit: Payer: Medicare Other | Admitting: Nurse Practitioner

## 2023-03-20 ENCOUNTER — Other Ambulatory Visit (HOSPITAL_COMMUNITY): Payer: Self-pay

## 2023-03-20 NOTE — Progress Notes (Signed)
Pt has seen in my chart. Gh 

## 2023-03-21 ENCOUNTER — Other Ambulatory Visit: Payer: Self-pay

## 2023-03-21 ENCOUNTER — Other Ambulatory Visit (HOSPITAL_COMMUNITY): Payer: Self-pay

## 2023-03-21 ENCOUNTER — Encounter: Payer: Medicare Other | Admitting: Clinical

## 2023-03-21 ENCOUNTER — Encounter: Payer: Self-pay | Admitting: Pharmacist

## 2023-03-21 LAB — CYTOLOGY - PAP
Comment: NEGATIVE
Diagnosis: NEGATIVE
High risk HPV: NEGATIVE

## 2023-03-22 ENCOUNTER — Other Ambulatory Visit: Payer: Self-pay

## 2023-03-22 ENCOUNTER — Other Ambulatory Visit (HOSPITAL_COMMUNITY): Payer: Self-pay

## 2023-03-27 ENCOUNTER — Other Ambulatory Visit (HOSPITAL_BASED_OUTPATIENT_CLINIC_OR_DEPARTMENT_OTHER): Payer: Self-pay

## 2023-03-28 ENCOUNTER — Ambulatory Visit (INDEPENDENT_AMBULATORY_CARE_PROVIDER_SITE_OTHER): Payer: 59 | Admitting: Physician Assistant

## 2023-03-28 ENCOUNTER — Encounter: Payer: Self-pay | Admitting: Physician Assistant

## 2023-03-28 VITALS — BP 116/78 | HR 93 | Ht 63.0 in | Wt 206.0 lb

## 2023-03-28 DIAGNOSIS — M25551 Pain in right hip: Secondary | ICD-10-CM | POA: Diagnosis not present

## 2023-03-28 NOTE — Progress Notes (Signed)
Chief Complaint: Right lower quadrant abdominal pain  HPI:    Sharon Chan is a 47 year old female with past medical history as listed below including CVA (08/15/2022 echo with LVEF 60-65%), diabetes type 2, polycystic ovary disease, prior cholecystectomy, seizures and multiple others, who was referred to me by Ivonne Andrew, NP for a complaint of right lower quadrant abdominal pain.    02/15/2023 abdominal ultrasound for right abdominal pain for the past 2 to 3 months was normal with prior cholecystectomy.    03/13/2023 CBC with an MCV low at 77 and otherwise normal, CMP normal.    Today, patient presents to clinic and tells me that around Christmas she started with a pain that would happen maybe 1 or 2 times a month on her right lower side, this pain was so sharp that it would knock her off her feet and felt like a knot that would take at least an hour to go away.  She would take 2 Tylenol or at one point and Tramadol but they did not really seem to help, just gradually eased off on its own but knocked her off her feet and she had to lay still when it occurred.  This has increased and now is 5-6 times a day but the same intensity of pain often 8-9/10 and she just has to stop what ever she is doing.  Her bowel movements are normal, no other GI issues.  Does some she had a prior colonoscopy years ago.  Occasionally has some nausea and dizziness.    Denies fever, chills, weight loss or symptoms that awaken her from sleep.  Past Medical History:  Diagnosis Date   Anxiety    CVA (cerebral vascular accident) (HCC)    Depression    DM type 2 (diabetes mellitus, type 2) (HCC)    HLD (hyperlipidemia)    HTN (hypertension)    Joint pain    Migraines    Neck pain    Obese    Polycystic disease, ovaries    Right sided weakness    Seizures (HCC)    Shoulder pain    Sickle cell trait (HCC)    Thyroid nodule    Vitamin D deficiency 01/2020    Past Surgical History:  Procedure Laterality Date    LAPAROSCOPIC CHOLECYSTECTOMY      Current Outpatient Medications  Medication Sig Dispense Refill   albuterol (VENTOLIN HFA) 108 (90 Base) MCG/ACT inhaler Inhale 2 puffs into the lungs every 6 (six) hours as needed for wheezing or shortness of breath. 1 each 11   amLODipine (NORVASC) 10 MG tablet Take 1 tablet (10 mg total) by mouth daily. 30 tablet 11   aspirin EC 81 MG tablet Take 1 tablet (81 mg total) by mouth in the morning. 30 tablet 11   atorvastatin (LIPITOR) 80 MG tablet Take 1 tablet (80 mg total) by mouth daily. 90 tablet 3   blood glucose meter kit and supplies KIT Dispense based on patient and insurance preference. Use up to four times daily as directed. (FOR ICD-9 250.00, 250.01). 1 each 0   cyclobenzaprine (FLEXERIL) 5 MG tablet TAKE ONE TABLET BY MOUTH THREE TIMES DAILY AS NEEDED (VIAL) 30 tablet 11   diphenhydrAMINE (BENADRYL) 25 MG tablet Take 25 mg by mouth every 6 (six) hours as needed for allergies.     divalproex (DEPAKOTE ER) 500 MG 24 hr tablet Take 1 tablet (500 mg total) by mouth 2 (two) times daily. 180 tablet 3   eszopiclone (  LUNESTA) 1 MG TABS tablet Take 1 tablet (1 mg total) by mouth at bedtime as needed for sleep. Take immediately before bedtime 30 tablet 0   nortriptyline (PAMELOR) 25 MG capsule Take 3 capsules (75 mg total) by mouth every evening. 270 capsule 3   OneTouch Delica Lancets 33G MISC TES 2 (TWO) TIMES DAILY. 100 each 5   Semaglutide,0.25 or 0.5MG /DOS, (OZEMPIC, 0.25 OR 0.5 MG/DOSE,) 2 MG/3ML SOPN Inject 0.25 mg weekly for 4 weeks, then increase to 0.5 mg weekly 9 mL 1   spironolactone (ALDACTONE) 25 MG tablet Take 1 tablet (25 mg total) by mouth daily. 90 tablet 3   topiramate (TOPAMAX) 100 MG tablet Take 1 tablet (100 mg total) by mouth in the morning AND 2 tablets (200 mg total) every evening. 270 tablet 3   valsartan-hydrochlorothiazide (DIOVAN-HCT) 320-12.5 MG tablet Take 1 tablet by mouth daily. 30 tablet 11   No current facility-administered  medications for this visit.    Allergies as of 03/28/2023 - Review Complete 03/17/2023  Allergen Reaction Noted   Penicillins Anaphylaxis 10/27/2017   Other Itching, Rash, and Other (See Comments) 09/05/2022    Family History  Problem Relation Age of Onset   Heart attack Mother    Diabetes Mother    Hypertension Mother    Hyperlipidemia Mother    Post-traumatic stress disorder Father        committed suicide   Diabetes Father    Hypertension Sister    Breast cancer Maternal Grandmother    Breast cancer Paternal Grandmother    Thyroid cancer Paternal Grandmother     Social History   Socioeconomic History   Marital status: Single    Spouse name: Not on file   Number of children: 0   Years of education: Not on file   Highest education level: Not on file  Occupational History   Not on file  Tobacco Use   Smoking status: Never   Smokeless tobacco: Never  Vaping Use   Vaping Use: Never used  Substance and Sexual Activity   Alcohol use: No   Drug use: No   Sexual activity: Not Currently  Other Topics Concern   Not on file  Social History Narrative   Lives at home, mom currently staying with her   Left-handed   Caffeine: occasional decaf coffee or raspberry tea   One story home   Social Determinants of Health   Financial Resource Strain: High Risk (02/12/2023)   Overall Financial Resource Strain (CARDIA)    Difficulty of Paying Living Expenses: Hard  Food Insecurity: Food Insecurity Present (02/12/2023)   Hunger Vital Sign    Worried About Running Out of Food in the Last Year: Often true    Ran Out of Food in the Last Year: Often true  Transportation Needs: Patient Declined (02/12/2023)   PRAPARE - Administrator, Civil Service (Medical): Patient declined    Lack of Transportation (Non-Medical): Patient declined  Physical Activity: Unknown (02/12/2023)   Exercise Vital Sign    Days of Exercise per Week: Patient declined    Minutes of Exercise per  Session: 20 min  Recent Concern: Physical Activity - Insufficiently Active (12/22/2022)   Exercise Vital Sign    Days of Exercise per Week: 3 days    Minutes of Exercise per Session: 20 min  Stress: No Stress Concern Present (02/12/2023)   [employer] of Occupational Health - Occupational Stress Questionnaire    Feeling of Stress : Only a little  Social Connections: Unknown (02/12/2023)   Social Connection and Isolation Panel [NHANES]    Frequency of Communication with Friends and Family: Once a week    Frequency of Social Gatherings with Friends and Family: Patient declined    Attends Religious Services: More than 4 times per year    Active Member of [employer] or Organizations: Yes    Attends Banker Meetings: Never    Marital Status: Never married  Intimate Partner Violence: Not At Risk (12/22/2022)   Humiliation, Afraid, Rape, and Kick questionnaire    Fear of Current or Ex-Partner: No    Emotionally Abused: No    Physically Abused: No    Sexually Abused: No    Review of Systems:    Constitutional: No weight loss, fever or chills Skin: No rash Cardiovascular: No chest pain Respiratory: No SOB  Gastrointestinal: See HPI and otherwise negative Genitourinary: No dysuria Neurological: No headache, dizziness or syncope Musculoskeletal: No new muscle or joint pain Hematologic: No bleeding  Psychiatric: No history of depression or anxiety   Physical Exam:  Vital signs: BP 116/78   Pulse 93   Ht 5\' 3"  (1.6 m)   Wt 206 lb (93.4 kg)   LMP 03/12/2023 (Exact Date)   SpO2 95%   BMI 36.49 kg/m    Constitutional:   Pleasant overweight AA female appears to be in NAD, Well developed, Well nourished, alert and cooperative Head:  Normocephalic and atraumatic. Eyes:   PEERL, EOMI. No icterus. Conjunctiva pink. Ears:  Normal auditory acuity. Neck:  Supple Throat: Oral cavity and pharynx without inflammation, swelling or lesion.  Respiratory: Respirations even and  unlabored. Lungs clear to auscultation bilaterally.   No wheezes, crackles, or rhonchi.  Cardiovascular: Normal S1, S2. No MRG. Regular rate and rhythm. No peripheral edema, cyanosis or pallor.  Gastrointestinal:  Soft, nondistended, nontender. No rebound or guarding. Normal bowel sounds. No appreciable masses or hepatomegaly. Rectal:  Not performed.  Msk:  Symmetrical without gross deformities. Without edema, no deformity or joint abnormality. +ttp over right anterior pelvis, worse with lifting of the right leg Neurologic:  Alert and  oriented x4;  grossly normal neurologically.  Skin:   Dry and intact without significant lesions or rashes. Psychiatric:  Demonstrates good judgement and reason without abnormal affect or behaviors.  RELEVANT LABS AND IMAGING: CBC    Component Value Date/Time   WBC 5.0 03/13/2023 1207   WBC 5.7 09/05/2022 1029   RBC 5.31 (H) 03/13/2023 1207   RBC 4.76 09/05/2022 1029   HGB 12.6 03/13/2023 1207   HCT 40.6 03/13/2023 1207   PLT 306 03/13/2023 1207   MCV 77 (L) 03/13/2023 1207   MCH 23.7 (L) 03/13/2023 1207   MCH 25.0 (L) 09/05/2022 1029   MCHC 31.0 (L) 03/13/2023 1207   MCHC 33.7 09/05/2022 1029   RDW 15.6 (H) 03/13/2023 1207   LYMPHSABS 2.2 09/05/2022 1029   LYMPHSABS 2.7 02/03/2020 1412   MONOABS 0.5 09/05/2022 1029   EOSABS 0.1 09/05/2022 1029   EOSABS 0.2 02/03/2020 1412   BASOSABS 0.1 09/05/2022 1029   BASOSABS 0.1 02/03/2020 1412    CMP     Component Value Date/Time   NA 142 03/13/2023 1207   K 4.1 03/13/2023 1207   CL 102 03/13/2023 1207   CO2 24 03/13/2023 1207   GLUCOSE 92 03/13/2023 1207   GLUCOSE 120 (H) 09/05/2022 1029   BUN 10 03/13/2023 1207   CREATININE 0.87 03/13/2023 1207   CREATININE 0.92 09/12/2017 1121  CALCIUM 9.5 03/13/2023 1207   PROT 6.2 03/13/2023 1207   ALBUMIN 4.1 03/13/2023 1207   AST 12 03/13/2023 1207   ALT 11 03/13/2023 1207   ALKPHOS 86 03/13/2023 1207   BILITOT 0.3 03/13/2023 1207   GFRNONAA >60  09/05/2022 1029   GFRNONAA 71 03/17/2017 1513   GFRAA 83 02/03/2020 1412   GFRAA 82 03/17/2017 1513    Assessment: 1.  Right hip pain: It seems that patient has pain on palpation over her right hip worse with lifting of the right leg, seems to radiate over into her abdomen up to her bellybutton, prior abdominal ultrasound normal, no other GI symptoms; most likely musculoskeletal in origin  Plan: 1.  Ordered a CT of the pelvis with contrast for further evaluation.  Pending results would recommend referral to an orthopedic doctor/DO for further eval. 2.  At some point patient will need a repeat colonoscopy.  She reports one years ago though I cannot see this in our system.  She will need one for screening purposes.  We can address this after her acute pain is better. 3.  Patient assigned to Dr. Meridee Score this afternoon.  Patient will return to clinic per recommendations after imaging above.   Hyacinth Meeker, PA-C [facility] 03/28/2023, 1:50 PM  Cc: Ivonne Andrew, NP

## 2023-03-28 NOTE — Patient Instructions (Signed)
You have been scheduled for a CT scan of the abdomen and pelvis at [facility], 1st floor Radiology. You are scheduled on Thursday 04/13/23 at 1:30 pm. You should arrive 15 minutes prior to your appointment time for registration.    Please follow the written instructions below on the day of your exam:   1) Do not eat anything after 9:30 am (4 hours prior to your test)   You may take any medications as prescribed with a small amount of water, if necessary. If you take any of the following medications: METFORMIN, GLUCOPHAGE, GLUCOVANCE, AVANDAMET, RIOMET, FORTAMET, ACTOPLUS MET, JANUMET, GLUMETZA or METAGLIP, you MAY be asked to HOLD this medication 48 hours AFTER the exam.   The purpose of you drinking the oral contrast is to aid in the visualization of your intestinal tract. The contrast solution may cause some diarrhea. Depending on your individual set of symptoms, you may also receive an intravenous injection of x-ray contrast/dye. Plan on being at [facility] for 45 minutes or longer, depending on the type of exam you are having performed.   If you have any questions regarding your exam or if you need to reschedule, you may call [facility] at [phone] between the hours of 8:00 am and 5:00 pm, Monday-Friday.

## 2023-03-29 ENCOUNTER — Ambulatory Visit: Payer: Medicare Other | Admitting: Nurse Practitioner

## 2023-03-29 ENCOUNTER — Other Ambulatory Visit (HOSPITAL_BASED_OUTPATIENT_CLINIC_OR_DEPARTMENT_OTHER): Payer: Self-pay

## 2023-03-30 ENCOUNTER — Other Ambulatory Visit: Payer: Self-pay | Admitting: Nurse Practitioner

## 2023-03-30 ENCOUNTER — Other Ambulatory Visit: Payer: 59

## 2023-03-30 DIAGNOSIS — E782 Mixed hyperlipidemia: Secondary | ICD-10-CM | POA: Diagnosis not present

## 2023-03-31 ENCOUNTER — Telehealth: Payer: Self-pay | Admitting: *Deleted

## 2023-03-31 ENCOUNTER — Other Ambulatory Visit: Payer: Self-pay | Admitting: *Deleted

## 2023-03-31 DIAGNOSIS — R1031 Right lower quadrant pain: Secondary | ICD-10-CM

## 2023-03-31 DIAGNOSIS — M25551 Pain in right hip: Secondary | ICD-10-CM

## 2023-03-31 LAB — BASIC METABOLIC PANEL
BUN/Creatinine Ratio: 16 (ref 9–23)
BUN: 13 mg/dL (ref 6–24)
CO2: 24 mmol/L (ref 20–29)
Calcium: 9 mg/dL (ref 8.7–10.2)
Chloride: 106 mmol/L (ref 96–106)
Creatinine, Ser: 0.81 mg/dL (ref 0.57–1.00)
Glucose: 117 mg/dL — ABNORMAL HIGH (ref 70–99)
Potassium: 3.7 mmol/L (ref 3.5–5.2)
Sodium: 143 mmol/L (ref 134–144)
eGFR: 89 mL/min/{1.73_m2} (ref >59)

## 2023-03-31 LAB — LIPID PANEL
Chol/HDL Ratio: 4.2 ratio (ref 0.0–4.4)
Cholesterol, Total: 191 mg/dL (ref 100–199)
HDL: 46 mg/dL (ref >39)
LDL Chol Calc (NIH): 133 mg/dL — ABNORMAL HIGH (ref 0–99)
Triglycerides: 65 mg/dL (ref 0–149)
VLDL Cholesterol Cal: 12 mg/dL (ref 5–40)

## 2023-03-31 LAB — LDL CHOLESTEROL, DIRECT: LDL Direct: 122 mg/dL — ABNORMAL HIGH (ref 0–99)

## 2023-03-31 NOTE — Progress Notes (Signed)
Attending Physician's Attestation   I have reviewed the chart.   I agree with the Advanced Practitioner's note, impression, and recommendations with any updates as below. Agree with imaging, would recommend CT abdomen/pelvis rather than just CT pelvis.  Will place my team on here to make the switch in regards to the imaging time.   Corliss Parish, MD [facility] Advanced Endoscopy Office # [phone]

## 2023-03-31 NOTE — Telephone Encounter (Signed)
Left message for patient to call office.  

## 2023-03-31 NOTE — Telephone Encounter (Signed)
Patient returning call. Pease advise.  Thank you

## 2023-03-31 NOTE — Telephone Encounter (Addendum)
Mariane Duval, CMA at 03/28/2023  2:00 PM  Status: Addendum  CT scan switched to CT A&P.     Revision History   Loretha Stapler, RN at 03/28/2023  2:00 PM  Status: Signed  Merilynn Haydu please see the note from Dr Meridee Score regarding the CT change.  Thanks      Mansouraty, Netty Starring., MD at 03/28/2023  2:00 PM  Status: Signed   Attending Physician's Attestation    I have reviewed the chart.    I agree with the Advanced Practitioner's note, impression, and recommendations with any updates as below. Agree with imaging, would recommend CT abdomen/pelvis rather than just CT pelvis.  Will place my team on here to make the switch in regards to the imaging time.     Corliss Parish, MD [facility] Advanced Endoscopy Office # [phone]

## 2023-03-31 NOTE — Progress Notes (Addendum)
CT scan switched to CT A&P.

## 2023-03-31 NOTE — Telephone Encounter (Signed)
Patient informed of change from CT pelvis to CT A&P and new arrival time on 04/13/23.

## 2023-03-31 NOTE — Progress Notes (Signed)
Heather please see the note from Dr Meridee Score regarding the CT change.  Thanks

## 2023-04-04 ENCOUNTER — Ambulatory Visit: Payer: Medicare Other | Admitting: Nurse Practitioner

## 2023-04-04 ENCOUNTER — Ambulatory Visit (INDEPENDENT_AMBULATORY_CARE_PROVIDER_SITE_OTHER): Payer: 59 | Admitting: Clinical

## 2023-04-04 DIAGNOSIS — F3289 Other specified depressive episodes: Secondary | ICD-10-CM

## 2023-04-04 DIAGNOSIS — F419 Anxiety disorder, unspecified: Secondary | ICD-10-CM

## 2023-04-04 NOTE — BH Specialist Note (Signed)
Integrated Behavioral Health via Telemedicine Visit  04/10/2023 Sharon Chan [id number]  Number of Integrated Behavioral Health Clinician visits: Additional Visit  Session Start time: 1405   Session End time: 1455  Total time in minutes: 50   Referring Provider: Angus Seller, NP Patient/Family location: [address] [place] [place] (home) [facility] Provider location: [facility] All persons participating in visit: patient, CSW Types of Service: Individual psychotherapy and Telephone visit  I connected with Sharon Chan via  Telephone and verified that I am speaking with the correct person using two identifiers. Discussed confidentiality: Yes   I discussed the limitations of telemedicine and the availability of in person appointments.  Discussed there is a possibility of technology failure and discussed alternative modes of communication if that failure occurs.  I discussed that engaging in this telemedicine visit, they consent to the provision of behavioral healthcare and the services will be billed under their insurance.  Patient and/or legal guardian expressed understanding and consented to Telemedicine visit: Yes   Presenting Concerns: Patient reports the following symptoms/concerns: depression and anxiety related to health concerns Duration of problem: several years; Severity of problem: moderate  Patient and/or Family's Strengths/Protective Factors: Social connections, Social and Emotional competence, Concrete supports in place (healthy food, safe environments, etc.), and Sense of purpose    Goals Addressed: Patient will: Reduce symptoms of: anxiety and depression   Increase knowledge and/or ability of: coping skills and self-management skills   Demonstrate ability to: Increase healthy adjustment to current life circumstances  Progress towards Goals: Ongoing  Interventions: Interventions utilized:  Supportive Counseling Standardized  Assessments completed: Not Needed  Patient has continued to experience physical health challenges. She is also dealing with conflict with her dad's family, regarding his care and affairs. Supportive counseling to explore patient's thoughts and feelings around these issues. Provided emotional validation and reflective listening. Patient has not been sleeping or eating well. Discussed importance of these two things in maintaining not only physical health, but also emotional health.  Patient and/or Family Response: Patient engaged in session.   Assessment: Patient currently experiencing depression and anxiety related to health conditions and anxiety related to social situations.   Patient may benefit from CBT to explore unhelpful beliefs about self and re-frame these beliefs to improve mood and decrease anxiety.  Plan: Follow up with behavioral health clinician on : 05/02/23  I discussed the assessment and treatment plan with the patient and/or parent/guardian. They were provided an opportunity to ask questions and all were answered. They agreed with the plan and demonstrated an understanding of the instructions.   They were advised to call back or seek an in-person evaluation if the symptoms worsen or if the condition fails to improve as anticipated.  Abigail Butts, LCSW

## 2023-04-06 ENCOUNTER — Other Ambulatory Visit: Payer: Medicare Other | Admitting: Pharmacist

## 2023-04-06 NOTE — Progress Notes (Signed)
04/07/2023 Name: Sharon Chan MRN: [id number] DOB: Nov 27, 1975  Chief Complaint  Patient presents with   Medication Management   Hypertension   Diabetes    Sharon Chan is a 47 y.o. year old female who presented for a telephone visit.   They were referred to the pharmacist by their PCP for assistance in managing diabetes and hypertension.    Subjective:  Care Team: Primary Care Provider: Ivonne Andrew, NP ; Next Scheduled Visit: 08/16/23 Cardiologist: Duke Salvia; Next Scheduled Visit: 06/01/23  Medication Access/Adherence  Current Pharmacy:  [facility] [address] [place] [address] Phone: [phone] Fax: [phone]  [id number] - [place], [place] - [address] AT [facility] [address] [place] [address] Phone: [phone] Fax: [phone]   Patient reports affordability concerns with their medications: No  Patient reports access/transportation concerns to their pharmacy: No  Patient reports adherence concerns with their medications:  No    Diabetes:  Current medications: Ozempic 0.25 mg weekly, about to increase to 0.5 mg weekly  Denies stomach upset, nausea, constipation.   Hypertension:  Current medications: amlodipine 10 mg daily, valsartan/HCTZ 320/12.5 mg daily, spironolactone 25 mg daily  BMP WNL after spironolactone addition.   Patient has a validated, automated, upper arm home BP cuff Current blood pressure readings readings: see below  Patient denies hypotensive s/sx including dizziness, lightheadedness.  Patient denies hypertensive symptoms including headache, chest pain, shortness of breath   AM  PM   9-May 140 95 145 75  10-May 159 93 147 80  11-May 133 72 126 64  12-May 160 98 144 78  13-May 151 94 133 72  14-May 128 79 124 68  15-May 147 75 129 71  16-May 184 100 147 75  17-May 142 80 137 87  18-May 160 104 132 69  19-May  129 70 149 77  20-May 139 92 126 64  21-May 147 75 126 64  22-May 121 68 113 71  AVG 146 84 133 72    Hyperlipidemia/ASCVD Risk Reduction  Current lipid lowering medications: atorvastatin 80 mg daily  Antiplatelet regimen: 81 mg daily   Objective:  Lab Results  Component Value Date   HGBA1C 5.6 02/13/2023    Lab Results  Component Value Date   CREATININE 0.81 03/30/2023   BUN 13 03/30/2023   NA 143 03/30/2023   K 3.7 03/30/2023   CL 106 03/30/2023   CO2 24 03/30/2023    Lab Results  Component Value Date   CHOL 191 03/30/2023   HDL 46 03/30/2023   LDLCALC 133 (H) 03/30/2023   LDLDIRECT 122 (H) 03/30/2023   TRIG 65 03/30/2023   CHOLHDL 4.2 03/30/2023    Medications Reviewed Today     Reviewed by Alden Hipp, RPH-CPP (Pharmacist) on 04/06/23 at 1503  Med List Status: <None>   Medication Order Taking? Sig Documenting Provider Last Dose Status Informant  albuterol (VENTOLIN HFA) 108 (90 Base) MCG/ACT inhaler [id number]  Inhale 2 puffs into the lungs every 6 (six) hours as needed for wheezing or shortness of breath. Ivonne Andrew, NP  Active   amLODipine (NORVASC) 10 MG tablet [id number] Yes Take 1 tablet (10 mg total) by mouth daily. Ivonne Andrew, NP Taking Active   aspirin EC 81 MG tablet [id number] Yes Take 1 tablet (81 mg total) by mouth in the morning. Ivonne Andrew, NP Taking Active   atorvastatin (LIPITOR) 80 MG  tablet [id number] Yes Take 1 tablet (80 mg total) by mouth daily. Ivonne Andrew, NP Taking Active   blood glucose meter kit and supplies KIT [id number]  Dispense based on patient and insurance preference. Use up to four times daily as directed. (FOR ICD-9 250.00, 250.01). Wilder Glade, MD  Active Pharmacy Records, Self  cyclobenzaprine (FLEXERIL) 5 MG tablet [id number] Yes TAKE ONE TABLET BY MOUTH THREE TIMES DAILY AS NEEDED (VIAL) Ivonne Andrew, NP Taking Active   diphenhydrAMINE (BENADRYL) 25 MG tablet [id number]  Take 25 mg by mouth  every 6 (six) hours as needed for allergies. [provider]  Active Pharmacy Records, Self           Med Note (COFFELL, Foye Spurling Sep 05, 2022  9:31 AM)    divalproex (DEPAKOTE ER) 500 MG 24 hr tablet [id number] Yes Take 1 tablet (500 mg total) by mouth 2 (two) times daily. Van Clines, MD Taking Active   eszopiclone Alfonso Patten) 1 MG TABS tablet [id number] No Take 1 tablet (1 mg total) by mouth at bedtime as needed for sleep. Take immediately before bedtime  Patient not taking: Reported on 04/06/2023   Noemi Chapel, NP Not Taking Active Pharmacy Records, Self  metFORMIN (GLUCOPHAGE) 500 MG tablet [id number] Yes Take 500 mg by mouth 2 (two) times daily. [provider] Taking Active   nortriptyline (PAMELOR) 25 MG capsule [id number] Yes Take 3 capsules (75 mg total) by mouth every evening. Van Clines, MD Taking Active   OneTouch Delica Lancets 33G [place] [id number]  TES 2 (TWO) TIMES DAILY. Kallie Locks, FNP  Active Pharmacy Records, Self  Semaglutide,0.25 or 0.5MG /DOS, (OZEMPIC, 0.25 OR 0.5 MG/DOSE,) 2 MG/3ML SOPN [id number] Yes Inject 0.25 mg weekly for 4 weeks, then increase to 0.5 mg weekly Ivonne Andrew, NP Taking Active   spironolactone (ALDACTONE) 25 MG tablet [id number] Yes Take 1 tablet (25 mg total) by mouth daily. Ivonne Andrew, NP Taking Active   topiramate (TOPAMAX) 100 MG tablet [id number] Yes Take 1 tablet (100 mg total) by mouth in the morning AND 2 tablets (200 mg total) every evening. Van Clines, MD Taking Active   valsartan-hydrochlorothiazide (DIOVAN-HCT) 320-12.5 MG tablet [id number] Yes Take 1 tablet by mouth daily. Ivonne Andrew, NP Taking Active               Assessment/Plan:   Diabetes: - Currently controlled but with opportunity for regimen improvement.  - Recommend to continue plan to increase Ozempic to 0.5 mg weekly. Monitor for any GI intolerance  Hypertension: - Currently relatively well controlled with great  fluctuation. Patient notes some adherence problems occasionally, and historically has reported stress to be a factor.  - Recommend to continue current regimen at this time and keep appointment  - Reviewed appropriate blood pressure monitoring technique and reviewed goal blood pressure. Recommended to check home blood pressure and heart rate twice daily.  - Will work with pharmacy to move amlodipine to evening administration to better target AM BP s. Continue to focus on adherence. Follow up in 3 weeks   Hyperlipidemia/ASCVD Risk Reduction: - Currently uncontrolled but improved, but still could be related to adherence. Patient notes she has set reminders on her phone and has recently started receiving adherence packaging, which is expected to improve adherence. Will follow. Consider addition of PCSK9i for ASCVD risk reduction.   Follow Up Plan: phone call in 3 weeks  Catie Eppie Gibson, PharmD, BCACP, CPP [facility] [phone]

## 2023-04-07 ENCOUNTER — Other Ambulatory Visit: Payer: Self-pay

## 2023-04-07 NOTE — Patient Instructions (Signed)
Check your blood pressure twice daily, and any time you have concerning symptoms like headache, chest pain, dizziness, shortness of breath, or vision changes.   Our goal is less than 130/80.  To appropriately check your blood pressure, make sure you do the following:  1) Avoid caffeine, exercise, or tobacco products for 30 minutes before checking. Empty your bladder. 2) Sit with your back supported in a flat-backed chair. Rest your arm on something flat (arm of the chair, table, etc). 3) Sit still with your feet flat on the floor, resting, for at least 5 minutes.  4) Check your blood pressure. Take 1-2 readings.  5) Write down these readings and bring with you to any provider appointments.  Bring your home blood pressure machine with you to a provider's office for accuracy comparison at least once a year.   Make sure you take your blood pressure medications before you come to any office visit, even if you were asked to fast for labs.   Take care!  Catie Eppie Gibson, PharmD, BCACP, CPP [facility] [phone]

## 2023-04-13 ENCOUNTER — Ambulatory Visit (HOSPITAL_COMMUNITY)
Admission: RE | Admit: 2023-04-13 | Discharge: 2023-04-13 | Disposition: A | Discharge status: Home / Self Care | Payer: 59 | Source: Ambulatory Visit | Attending: Physician Assistant | Admitting: Physician Assistant

## 2023-04-13 ENCOUNTER — Ambulatory Visit (HOSPITAL_COMMUNITY): Payer: 59

## 2023-04-13 DIAGNOSIS — N186 End stage renal disease: Secondary | ICD-10-CM | POA: Diagnosis not present

## 2023-04-13 DIAGNOSIS — K219 Gastro-esophageal reflux disease without esophagitis: Secondary | ICD-10-CM | POA: Diagnosis not present

## 2023-04-13 DIAGNOSIS — M25551 Pain in right hip: Secondary | ICD-10-CM | POA: Insufficient documentation

## 2023-04-13 DIAGNOSIS — E1122 Type 2 diabetes mellitus with diabetic chronic kidney disease: Secondary | ICD-10-CM | POA: Diagnosis not present

## 2023-04-13 DIAGNOSIS — R1031 Right lower quadrant pain: Secondary | ICD-10-CM | POA: Insufficient documentation

## 2023-04-13 DIAGNOSIS — K8689 Other specified diseases of pancreas: Secondary | ICD-10-CM | POA: Diagnosis not present

## 2023-04-13 DIAGNOSIS — I120 Hypertensive chronic kidney disease with stage 5 chronic kidney disease or end stage renal disease: Secondary | ICD-10-CM | POA: Diagnosis not present

## 2023-04-13 DIAGNOSIS — Z992 Dependence on renal dialysis: Secondary | ICD-10-CM | POA: Diagnosis not present

## 2023-04-13 DIAGNOSIS — Z951 Presence of aortocoronary bypass graft: Secondary | ICD-10-CM | POA: Diagnosis not present

## 2023-04-13 MED ORDER — IOHEXOL 300 MG/ML  SOLN
100.0000 mL | Freq: Once | INTRAMUSCULAR | Status: AC | PRN
  Administered 2023-04-13: 100 mL via INTRAVENOUS

## 2023-04-13 MED ORDER — IOHEXOL 9 MG/ML PO SOLN
1000.0000 mL | ORAL | Status: AC
  Administered 2023-04-13: 1000 mL via ORAL

## 2023-04-14 ENCOUNTER — Other Ambulatory Visit: Payer: Self-pay

## 2023-04-14 DIAGNOSIS — R519 Headache, unspecified: Secondary | ICD-10-CM

## 2023-04-14 MED ORDER — TOPIRAMATE 100 MG PO TABS
ORAL_TABLET | ORAL | 3 refills | Status: DC
Start: 2023-04-14 — End: 2023-05-08

## 2023-04-17 ENCOUNTER — Other Ambulatory Visit: Payer: Self-pay

## 2023-04-17 DIAGNOSIS — M25551 Pain in right hip: Secondary | ICD-10-CM

## 2023-04-17 DIAGNOSIS — K862 Cyst of pancreas: Secondary | ICD-10-CM

## 2023-04-17 DIAGNOSIS — R1031 Right lower quadrant pain: Secondary | ICD-10-CM

## 2023-04-17 DIAGNOSIS — K869 Disease of pancreas, unspecified: Secondary | ICD-10-CM

## 2023-04-18 ENCOUNTER — Ambulatory Visit: Payer: Medicare Other | Admitting: Neurology

## 2023-04-24 ENCOUNTER — Ambulatory Visit (HOSPITAL_COMMUNITY)
Admission: RE | Admit: 2023-04-24 | Discharge: 2023-04-24 | Disposition: A | Discharge status: Home / Self Care | Payer: 59 | Source: Ambulatory Visit | Attending: Physician Assistant | Admitting: Physician Assistant

## 2023-04-24 ENCOUNTER — Other Ambulatory Visit: Payer: Self-pay | Admitting: Physician Assistant

## 2023-04-24 DIAGNOSIS — K869 Disease of pancreas, unspecified: Secondary | ICD-10-CM | POA: Insufficient documentation

## 2023-04-24 DIAGNOSIS — K862 Cyst of pancreas: Secondary | ICD-10-CM

## 2023-04-24 DIAGNOSIS — R935 Abnormal findings on diagnostic imaging of other abdominal regions, including retroperitoneum: Secondary | ICD-10-CM | POA: Diagnosis not present

## 2023-04-24 DIAGNOSIS — K5730 Diverticulosis of large intestine without perforation or abscess without bleeding: Secondary | ICD-10-CM | POA: Diagnosis not present

## 2023-04-24 MED ORDER — GADOBUTROL 1 MMOL/ML IV SOLN
9.0000 mL | Freq: Once | INTRAVENOUS | Status: AC | PRN
  Administered 2023-04-24: 9 mL via INTRAVENOUS

## 2023-04-28 ENCOUNTER — Other Ambulatory Visit: Payer: 59 | Admitting: Pharmacist

## 2023-04-28 ENCOUNTER — Other Ambulatory Visit: Payer: Self-pay

## 2023-04-28 MED ORDER — AMLODIPINE BESYLATE 10 MG PO TABS
10.0000 mg | ORAL_TABLET | Freq: Every evening | ORAL | 11 refills | Status: DC
  Filled 2023-04-28: qty 30, 30d supply, fill #0
  Filled 2023-06-06 – 2023-08-02 (×5): qty 30, 30d supply, fill #1
  Filled 2023-08-28: qty 30, 30d supply, fill #2
  Filled 2023-09-20: qty 30, 30d supply, fill #3

## 2023-04-28 NOTE — Progress Notes (Signed)
04/28/2023 Name: Sharon Chan MRN: [id number] DOB: 04-13-1976  Chief Complaint  Patient presents with   Hypertension    Sharon Chan is a 47 y.o. year old female who presented for a telephone visit.   They were referred to the pharmacist by their PCP for assistance in managing diabetes, hypertension, and hyperlipidemia.   Subjective:  Care Team: Primary Care Provider: Ivonne Andrew, NP ; Next Scheduled Visit:   Medication Access/Adherence  Current Pharmacy:  [facility] [address] [place] [address] Phone: [phone] Fax: [phone]  [id number] - [place], [place] - [address] AT [facility] [address] [place] [address] Phone: [phone] Fax: [phone]   Patient reports affordability concerns with their medications: No  Patient reports access/transportation concerns to their pharmacy: No  Patient reports adherence concerns with their medications:  No     Diabetes:  Current medications: Ozempic 0.5 mg weekly   Has had a decreased appetite since her first stroke, so notes she is already focusing on incorporating healthy proteins, lean proteins  Notes some constipation, but had it at baseline as well. Worsening constipation with menstrual cycle.   Hypertension:  Current medications: amlodipine 10 mg daily, valsartan/HCTZ 320/12.5 mg daily, spironolactone 25 mg daily   Patient has a validated, automated, upper arm home BP cuff Current blood pressure readings readings:   Date SBP DBP SBP DBP  1-Jun 128 69 147 97  2-Jun 124 64 132 66  3-Jun 146 85 141 76  4-Jun 168 99 136 71  5-Jun 135 74 136 80  6-Jun 122 70 140 81  7-Jun 114 65 144 70  8-Jun 162 92 160 90  9-Jun 122 64 132 70  10-Jun 124 76 126 63  11-Jun 127 82 116 63  12-Jun 124 68 128 72  13-Jun 121 64 133 74  14-Jun 133 89    AVG 132 75 136 75    Patient denies hypotensive s/sx  including dizziness, lightheadedness.  Patient denies hypertensive symptoms including headache, chest pain, shortness of breath    Hyperlipidemia/ASCVD Risk Reduction  Current lipid lowering medications: atorvastatin 80 mg daily   Objective:  Lab Results  Component Value Date   HGBA1C 5.6 02/13/2023    Lab Results  Component Value Date   CREATININE 0.81 03/30/2023   BUN 13 03/30/2023   NA 143 03/30/2023   K 3.7 03/30/2023   CL 106 03/30/2023   CO2 24 03/30/2023    Lab Results  Component Value Date   CHOL 191 03/30/2023   HDL 46 03/30/2023   LDLCALC 133 (H) 03/30/2023   LDLDIRECT 122 (H) 03/30/2023   TRIG 65 03/30/2023   CHOLHDL 4.2 03/30/2023    Medications Reviewed Today     Reviewed by Alden Hipp, RPH-CPP (Pharmacist) on 04/28/23 at 1139  Med List Status: <None>   Medication Order Taking? Sig Documenting Provider Last Dose Status Informant  albuterol (VENTOLIN HFA) 108 (90 Base) MCG/ACT inhaler [id number]  Inhale 2 puffs into the lungs every 6 (six) hours as needed for wheezing or shortness of breath. Ivonne Andrew, NP  Active   amLODipine (NORVASC) 10 MG tablet [id number] Yes Take 1 tablet (10 mg total) by mouth every evening. Ivonne Andrew, NP Taking Active   aspirin EC 81 MG tablet [id number]  Take 1 tablet (81 mg total) by mouth in the morning. Ivonne Andrew, NP  Active  atorvastatin (LIPITOR) 80 MG tablet [id number]  Take 1 tablet (80 mg total) by mouth daily. Ivonne Andrew, NP  Active   blood glucose meter kit and supplies KIT [id number]  Dispense based on patient and insurance preference. Use up to four times daily as directed. (FOR ICD-9 250.00, 250.01). Wilder Glade, MD  Active Pharmacy Records, Self  cyclobenzaprine (FLEXERIL) 5 MG tablet [id number]  TAKE ONE TABLET BY MOUTH THREE TIMES DAILY AS NEEDED (VIAL) Ivonne Andrew, NP  Active   diphenhydrAMINE (BENADRYL) 25 MG tablet [id number]  Take 25 mg by mouth every 6 (six) hours as  needed for allergies. [provider]  Active Pharmacy Records, Self           Med Note (COFFELL, Foye Spurling Sep 05, 2022  9:31 AM)    divalproex (DEPAKOTE ER) 500 MG 24 hr tablet [id number]  Take 1 tablet (500 mg total) by mouth 2 (two) times daily. Van Clines, MD  Active   eszopiclone Alfonso Patten) 1 MG TABS tablet [id number]  Take 1 tablet (1 mg total) by mouth at bedtime as needed for sleep. Take immediately before bedtime  Patient not taking: Reported on 04/06/2023   Noemi Chapel, NP  Active Pharmacy Records, Self  nortriptyline [facility]) 25 MG capsule [id number] Yes Take 3 capsules (75 mg total) by mouth every evening. Van Clines, MD Taking Active   OneTouch Delica Lancets 33G [place] [id number]  TES 2 (TWO) TIMES DAILY. Kallie Locks, FNP  Active Pharmacy Records, Self  Semaglutide,0.25 or 0.5MG /DOS, (OZEMPIC, 0.25 OR 0.5 MG/DOSE,) 2 MG/3ML SOPN [id number] Yes Inject 0.25 mg weekly for 4 weeks, then increase to 0.5 mg weekly Ivonne Andrew, NP Taking Active   spironolactone (ALDACTONE) 25 MG tablet [id number] Yes Take 1 tablet (25 mg total) by mouth daily. Ivonne Andrew, NP Taking Active   topiramate (TOPAMAX) 100 MG tablet [id number] Yes Take 1 tablet (100 mg total) by mouth in the morning AND 2 tablets (200 mg total) every evening. Van Clines, MD Taking Active   valsartan-hydrochlorothiazide (DIOVAN-HCT) 320-12.5 MG tablet [id number] Yes Take 1 tablet by mouth daily. Ivonne Andrew, NP Taking Active               Assessment/Plan:   Diabetes: - Currently controlled - Recommend to continue current regimen at this time    Hypertension: - Currently controlled - Reviewed long term cardiovascular and renal outcomes of uncontrolled blood pressure - Reviewed appropriate blood pressure monitoring technique and reviewed goal blood pressure. Recommended to check home blood pressure and heart rate daily - Recommend to continue current regimen    Hyperlipidemia/ASCVD Risk Reduction: - Currently uncontrolled but improved, but still could be related to adherence. Patient notes she has set reminders on her phone and has recently started receiving adherence packaging, which is expected to improve adherence. Will follow. Consider addition of PCSK9i for ASCVD risk reduction.    Follow Up Plan: phone call in 8 weeks  Catie TClearance Coots, PharmD, BCACP, CPP [facility] [phone]

## 2023-05-02 ENCOUNTER — Ambulatory Visit (INDEPENDENT_AMBULATORY_CARE_PROVIDER_SITE_OTHER): Payer: 59 | Admitting: Clinical

## 2023-05-02 DIAGNOSIS — F419 Anxiety disorder, unspecified: Secondary | ICD-10-CM

## 2023-05-02 DIAGNOSIS — F3289 Other specified depressive episodes: Secondary | ICD-10-CM

## 2023-05-02 NOTE — BH Specialist Note (Unsigned)
ADULT Comprehensive Clinical Assessment (CCA) Telemedicine Visit Note  05/02/23 Sharon Chan [id number]  Referring Provider: Angus Seller, NP Patient/Family location: home [facility] Provider location: [facility] All persons participating in visit: patient, CSW Types of Service: Individual psychotherapy, Comprehensive Clinical Assessment (CCA), and Video visit  I connected with Sharon Chan via Engineer, civil (consulting)  (Video is Surveyor, mining) and verified that I am speaking with the correct person using two identifiers. Discussed confidentiality: Yes   I discussed the limitations of telemedicine and the availability of in person appointments.  Discussed there is a possibility of technology failure and discussed alternative modes of communication if that failure occurs.  I discussed that engaging in this telemedicine visit, they consent to the provision of behavioral healthcare and the services will be billed under their insurance.  Patient and/or legal guardian expressed understanding and consented to Telemedicine visit: Yes   Session Start time: 1400    Session End time: 1500  Total time in minutes: 60   SUBJECTIVE: Sharon Chan is a 47 y.o.   female accompanied by  self.  Sharon Chan was seen in consultation at the request of Ivonne Andrew, NP for evaluation of  depression and anxiety .  Types of Service: Individual psychotherapy and Comprehensive Clinical Assessment (CCA)  Reason for referral in patient/family's own words:  Anxiety due to changes in physical and mental ability after stroke    She likes to be called Sharon Chan.  She came to the appointment with  self .  Primary language at home is [place].  Constitutional Appearance: cooperative, well-nourished, well-developed, alert and well-appearing  (Patient to answer as appropriate) Gender identity: female Sex assigned at birth: female Pronouns: she    Mental status exam:   General Appearance /Behavior:  Casual Eye Contact:  Good Motor Behavior:  Normal Speech:  Normal Level of Consciousness:  Alert Mood:  Euthymic Affect:  Appropriate Anxiety Level:  Minimal Thought Process:  Coherent Thought Content:  WNL Perception:  Normal Judgment:  Good Insight:  Present   Current Medications and therapies: She is taking:   Outpatient Encounter Medications as of 05/02/2023  Medication Sig   albuterol (VENTOLIN HFA) 108 (90 Base) MCG/ACT inhaler Inhale 2 puffs into the lungs every 6 (six) hours as needed for wheezing or shortness of breath.   amLODipine (NORVASC) 10 MG tablet Take 1 tablet (10 mg total) by mouth every evening.   aspirin EC 81 MG tablet Take 1 tablet (81 mg total) by mouth in the morning.   atorvastatin (LIPITOR) 80 MG tablet Take 1 tablet (80 mg total) by mouth daily.   blood glucose meter kit and supplies KIT Dispense based on patient and insurance preference. Use up to four times daily as directed. (FOR ICD-9 250.00, 250.01).   cyclobenzaprine (FLEXERIL) 5 MG tablet TAKE ONE TABLET BY MOUTH THREE TIMES DAILY AS NEEDED (VIAL)   diphenhydrAMINE (BENADRYL) 25 MG tablet Take 25 mg by mouth every 6 (six) hours as needed for allergies.   divalproex (DEPAKOTE ER) 500 MG 24 hr tablet Take 1 tablet (500 mg total) by mouth 2 (two) times daily.   eszopiclone (LUNESTA) 1 MG TABS tablet Take 1 tablet (1 mg total) by mouth at bedtime as needed for sleep. Take immediately before bedtime (Patient not taking: Reported on 04/06/2023)   nortriptyline (PAMELOR) 25 MG capsule Take 3 capsules (75 mg total) by mouth every evening.   OneTouch Delica Lancets 33G MISC TES 2 (TWO) TIMES DAILY.  Semaglutide,0.25 or 0.5MG /DOS, (OZEMPIC, 0.25 OR 0.5 MG/DOSE,) 2 MG/3ML SOPN Inject 0.25 mg weekly for 4 weeks, then increase to 0.5 mg weekly   spironolactone (ALDACTONE) 25 MG tablet Take 1 tablet (25 mg total) by mouth daily.   topiramate (TOPAMAX) 100 MG  tablet Take 1 tablet (100 mg total) by mouth in the morning AND 2 tablets (200 mg total) every evening.   valsartan-hydrochlorothiazide (DIOVAN-HCT) 320-12.5 MG tablet Take 1 tablet by mouth daily.   No facility-administered encounter medications on file as of 05/02/2023.     Therapies:  Behavioral therapy  Family history: Family mental illness:   depression in a couple family members Family school achievement history:   parents went to college, patient has a few  secondary degrees Other relevant family history:   none  Social History: Now living with  self. Employment:  Not employed, disabled Religious or Spiritual Beliefs: Christian  Negative Mood Concerns She does not make negative statements about self. Self-injury:  No Suicidal ideation:  No Suicide attempt:  No  Additional Anxiety Concerns: Panic attacks:  Yes-recently as this week (when in public and around people) Obsessions:  No Compulsions:  No  Stressors:  Family illness, Finances, and chronic illness  Alcohol and/or Substance Use: Have you recently consumed alcohol? no  Have you recently used any drugs?  no  Have you recently consumed any tobacco? no Does patient seem concerned about dependence or abuse of any substance? no  Substance Use Disorder Checklist:  Not applicable, patient has never used any substances  Severity Risk Scoring based on DSM-5 Criteria for Substance Use Disorder. The presence of at least two (2) criteria in the last 12 months indicate a substance use disorder. The severity of the substance use disorder is defined as:  Mild: Presence of 2-3 criteria Moderate: Presence of 4-5 criteria Severe: Presence of 6 or more criteria  Traumatic Experiences: History or current traumatic events (natural disaster, house fire, etc.)? no History or current physical trauma?  no History or current emotional trauma?  no History or current sexual trauma?  no History or current domestic or intimate  partner violence?  no  Risk Assessment: Suicidal or homicidal thoughts?   no Self injurious behaviors?  no Guns in the home?  no  Self Harm Risk Factors: Chronic pain and Family or marital conflict  Self Harm Thoughts?: No  Patient and/or Family's Strengths/Protective Factors: Social connections, Social and Emotional competence, Concrete supports in place (healthy food, safe environments, etc.), and Sense of purpose    Patient's and/or Family's Goals in their own words: To work through challenges, improve issues with panic and anxiety, have support as I see what is going on with pancreas - will it be cancer?  Interventions: Interventions utilized:  Supportive Counseling   Supportive counseling today around patient's continued challenges with physical and mental changes since her stroke. She is also experiencing anxiety about possible new problems; she reports she has a lesion on her pancreas and is concerned about possible cancer. She is having this evaluated. Emotional validation provided around this.   Patient and/or Family Response: Patient engaged in session.   Standardized Assessments completed: Not Needed  Patient Centered Plan: Patient is on the following Treatment Plan(s):  CBT for anxiety and depression  Coordination of Care:  coordination with PCP as needed  DSM-5 Diagnosis: F32.89 Depression, F41.9 Anxiety  Recommendations for Services/Supports/Treatments: Supportive counseling and CBT to process unhelpful beliefs about self and to learn to cope with anxiety.  Progress towards Goals: Ongoing  Treatment Plan Summary: Behavioral Health Clinician will: Assess individual's status and evaluate for psychiatric symptoms, Provide coping skills enhancement, Utilize evidence based practices to address psychiatric symptoms, and Provide therapeutic counseling and medication monitoring  Individual will: Complete all homework and actively participate during therapy, Report  any thoughts or plans of harming themselves or others, and Utilize coping skills taught in therapy to reduce symptoms  Referral(s): [facility] [employer] (In Clinic)  Abigail Butts, LCSW

## 2023-05-03 ENCOUNTER — Telehealth: Payer: Self-pay | Admitting: Physician Assistant

## 2023-05-03 NOTE — Telephone Encounter (Signed)
See 04/24/23 MRCP result note.

## 2023-05-03 NOTE — Telephone Encounter (Signed)
Inbound call from patient retuning phone call regarding recent imaging results. Please advise, thank you.

## 2023-05-04 ENCOUNTER — Other Ambulatory Visit: Payer: Self-pay

## 2023-05-04 ENCOUNTER — Ambulatory Visit (INDEPENDENT_AMBULATORY_CARE_PROVIDER_SITE_OTHER): Payer: 59 | Admitting: Nurse Practitioner

## 2023-05-04 ENCOUNTER — Encounter: Payer: Self-pay | Admitting: Nurse Practitioner

## 2023-05-04 ENCOUNTER — Other Ambulatory Visit (HOSPITAL_COMMUNITY): Payer: Self-pay

## 2023-05-04 VITALS — BP 126/80 | HR 67 | Ht 63.0 in | Wt 203.6 lb

## 2023-05-04 DIAGNOSIS — F5104 Psychophysiologic insomnia: Secondary | ICD-10-CM

## 2023-05-04 NOTE — Assessment & Plan Note (Signed)
No evidence of OSA on previous sleep study.  Struggled with insomnia since her initial stroke. She is on a topamax and depakote at night. She was on Ambien previously without good success. We made the shared decision to change her to [facility]. She has only used low dose of 1 mg. Not having good results. Reviewed with Dr. Maple Hudson. Will increase her to 3 mg nightly to see if she has better response. If not, will consider alternative class. Medication side effect profile reviewed. Cautioned to not drive after taking.   Patient Instructions  Increase Lunesta to 3 mg At bedtime for sleep. Do not drive after taking    Keep a sleep diary to help you and your health care provider figure out what could be causing your insomnia. Write down: When you sleep. When you wake up during the night. How well you sleep and how rested you feel the next day. Any side effects of medicines you are taking. What you eat and drink. Cool, dark sleeping environment. Limit screen use before bedtime.Stick to a routine that includes going to bed and waking up at the same times every day and night. This can help you fall asleep faster. Consider making a quiet activity, such as reading, part of your nighttime routine.  Call me to let me know how the increased dose is working next week    Follow up in 6 weeks to see how things are going with Dr. Wynona Neat or Dr. Maple Hudson. If symptoms do not improve, please contact office for sooner follow up

## 2023-05-04 NOTE — Patient Instructions (Signed)
Increase Lunesta to 3 mg At bedtime for sleep. Do not drive after taking    Keep a sleep diary to help you and your health care provider figure out what could be causing your insomnia. Write down: When you sleep. When you wake up during the night. How well you sleep and how rested you feel the next day. Any side effects of medicines you are taking. What you eat and drink. Cool, dark sleeping environment. Limit screen use before bedtime.Stick to a routine that includes going to bed and waking up at the same times every day and night. This can help you fall asleep faster. Consider making a quiet activity, such as reading, part of your nighttime routine.  Call me to let me know how the increased dose is working next week    Follow up in 6 weeks to see how things are going with Dr. Wynona Neat or Dr. Maple Hudson. If symptoms do not improve, please contact office for sooner follow up

## 2023-05-04 NOTE — Progress Notes (Signed)
@Patient  ID: Sharon Chan, female    DOB: Aug 18, 1976, 47 y.o.   MRN: [id number]  Chief Complaint  Patient presents with   Follow-up    Pt still not sleeping well. 2-3 hours a night     Referring provider: Ivonne Andrew, NP  HPI: 47 year old female, never smoker referred for sleep consult. Past medical history significant for HTN, hx of stroke with right sided hemiparesis, DM, polycystic kidney disease, HLD, sickle cell trait, insomnia, anxiety, obesity.   TEST/EVENTS:  07/27/2022 NPSG: AHI 0.3/h; SpO2 low 92%  06/28/2022: OV with Marieclaire Bettenhausen NP for sleep consult referred by Angus Seller, NP.  She was also encouraged to be evaluated by her neurologist.  She suffered from a stroke in 2018.  Since then, she has had trouble with chronic headaches and difficulty sleeping at night.  Wakes almost every hour.  She also has to use the bathroom frequently at night; has always attributed this to her polycystic kidney disease.  She also has excessive daytime fatigue symptoms; doesn't necessarily feel as though she could fall asleep but always feels run down with little energy.  States that after 6 PM, she is done for and is not able to do anything else.  Can fall asleep just being on the computer.  She has also had some trouble with her blood pressure recently.  Has not been told that she snores or stops breathing at night.  She denies any sleep parasomnia/paralysis, narcolepsy or cataplexy. She does not drive.  She goes to bed between 10 to 11 PM.  Can take anywhere from 30 minutes to a few hours for her to fall asleep.  Some mornings, she gets up around 3 AM to go for a walk for exercise.  She will then come back home and lay back down and go to sleep for a few more hours and then officially get out of the bed at 6 AM.  When she does not do this, she gets up at 6 AM.  She takes Ambien some nights to help her fall asleep, which she has been on since 2019.  Seems to help with sleep latency but not sleep  maintenance.  She has been on disability since her stroke in 2018.  Her weight is up about 20 to 30 pounds.  She has never had a formal sleep study before.  She is on multiple sedating medicines including Flexeril, Depakote and Topamax.  Her neurologist is concerned that she has had another recent cerebral infarction based on her imaging.  They are ordering a heart monitor to rule out underlying arrhythmia.  08/31/2022: OV with Laythan Hayter NP for follow up to discuss sleep study results, which did not reveal any evidence of sleep disordered breathing. She is still struggling with sleep maintenance.  She does not have as much trouble falling asleep, as long as she takes her Ambien but this does not seem to do much for her as far as staying asleep at night.  She is tired throughout the day.  Worse in the afternoons.  Still struggles with chronic daily headaches.  Being followed by neurology for recurrent strokes.  Does not drive.  She had a syncopal event in September.  Recommended to go to the ER for evaluation but did not go.  Her heart monitor showed in normal sinus rhythm at the time.  05/04/2023: Today - follow up Patient presents today for follow up. She was started on Lunesta back in October as she was  still having trouble with sleep on Ambien. She never increased her dose and has only been using 1 mg. She feels like this doesn't work for her. It helps her fall asleep but she's waking frequently throughout the night. The Ambien did keep her asleep a little bit longer. She has not tried an increased dose. She denies any sleep parasomnias/paralysis, AM hangover, drowsy driving.   Allergies  Allergen Reactions   Penicillins Anaphylaxis   Other Itching, Rash and Other (See Comments)    Strong sunlight - polymorphous light eruption    Immunization History  Administered Date(s) Administered   Influenza,inj,Quad PF,6+ Mos 09/12/2017, 10/01/2018, 11/20/2019, 10/18/2022   PFIZER(Purple Top)SARS-COV-2  Vaccination 02/06/2020, 02/25/2020    Past Medical History:  Diagnosis Date   Anxiety    CVA (cerebral vascular accident) (HCC)    Depression    DM type 2 (diabetes mellitus, type 2) (HCC)    HLD (hyperlipidemia)    HTN (hypertension)    Joint pain    Migraines    Neck pain    Obese    Polycystic disease, ovaries    Right sided weakness    Seizures (HCC)    Shoulder pain    Sickle cell trait (HCC)    Thyroid nodule    Vitamin D deficiency 01/2020    Tobacco History: Social History   Tobacco Use  Smoking Status Never  Smokeless Tobacco Never   Counseling given: Not Answered   Outpatient Medications Prior to Visit  Medication Sig Dispense Refill   albuterol (VENTOLIN HFA) 108 (90 Base) MCG/ACT inhaler Inhale 2 puffs into the lungs every 6 (six) hours as needed for wheezing or shortness of breath. 1 each 11   amLODipine (NORVASC) 10 MG tablet Take 1 tablet (10 mg total) by mouth every evening. 30 tablet 11   aspirin EC 81 MG tablet Take 1 tablet (81 mg total) by mouth in the morning. 30 tablet 11   atorvastatin (LIPITOR) 80 MG tablet Take 1 tablet (80 mg total) by mouth daily. 90 tablet 3   blood glucose meter kit and supplies KIT Dispense based on patient and insurance preference. Use up to four times daily as directed. (FOR ICD-9 250.00, 250.01). 1 each 0   cyclobenzaprine (FLEXERIL) 5 MG tablet TAKE ONE TABLET BY MOUTH THREE TIMES DAILY AS NEEDED (VIAL) 30 tablet 11   diphenhydrAMINE (BENADRYL) 25 MG tablet Take 25 mg by mouth every 6 (six) hours as needed for allergies.     divalproex (DEPAKOTE ER) 500 MG 24 hr tablet Take 1 tablet (500 mg total) by mouth 2 (two) times daily. 180 tablet 3   eszopiclone (LUNESTA) 1 MG TABS tablet Take 1 tablet (1 mg total) by mouth at bedtime as needed for sleep. Take immediately before bedtime 30 tablet 0   nortriptyline (PAMELOR) 25 MG capsule Take 3 capsules (75 mg total) by mouth every evening. 270 capsule 3   OneTouch Delica Lancets  33G MISC TES 2 (TWO) TIMES DAILY. 100 each 5   Semaglutide,0.25 or 0.5MG /DOS, (OZEMPIC, 0.25 OR 0.5 MG/DOSE,) 2 MG/3ML SOPN Inject 0.25 mg weekly for 4 weeks, then increase to 0.5 mg weekly 9 mL 1   spironolactone (ALDACTONE) 25 MG tablet Take 1 tablet (25 mg total) by mouth daily. 90 tablet 3   topiramate (TOPAMAX) 100 MG tablet Take 1 tablet (100 mg total) by mouth in the morning AND 2 tablets (200 mg total) every evening. 270 tablet 3   valsartan-hydrochlorothiazide (DIOVAN-HCT) 320-12.5 MG tablet Take  1 tablet by mouth daily. 30 tablet 11   No facility-administered medications prior to visit.     Review of Systems:   Constitutional: +excessive daytime fatigue, weight gain (20-30 lb). No night sweats, fevers, chills, or lassitude. HEENT: +chronic headaches. No difficulty swallowing, tooth/dental problems, or sore throat.  CV:  No chest pain, orthopnea, PND, swelling in lower extremities, anasarca, dizziness, palpitations, syncope Resp: No shortness of breath with exertion or at rest. No excess mucus or change in color of mucus. No productive or non-productive. No hemoptysis. No wheezing.  No chest wall deformity GU: No dysuria, change in color of urine, urgency or frequency.  No flank pain, no hematuria  MSK:  No joint pain or swelling.  No decreased range of motion.  No back pain. Neuro: No dizziness or lightheadedness. +right sided weakness (baseline) Psych: No depression or anxiety. Mood stable. +sleep disturbance    Physical Exam:  BP 126/80   Pulse 67   Ht 5\' 3"  (1.6 m)   Wt 203 lb 9.6 oz (92.4 kg)   SpO2 99%   BMI 36.07 kg/m   GEN: Pleasant, interactive, well-appearing; obese; in no acute distress. HEENT:  Normocephalic and atraumatic. PERRLA. Sclera white. Nasal turbinates pink, moist and patent bilaterally. No rhinorrhea present. Oropharynx pink and moist, without exudate or edema. No lesions, ulcerations, or postnasal drip. Mallampati III NECK:  Supple w/ fair ROM.   CV: RRR, no m/r/g, no peripheral edema. Pulses intact, +2 bilaterally. No cyanosis, pallor or clubbing. PULMONARY:  Unlabored, regular breathing. Clear bilaterally A&P w/o wheezes/rales/rhonchi. No accessory muscle use. No dullness to percussion. GI: BS present and normoactive. Soft, non-tender to palpation. MSK: No erythema, warmth or tenderness. Neuro: A/Ox3.  Skin: Warm, no lesions or rashe Psych: Normal affect and behavior. Judgement and thought content appropriate.     Lab Results:  CBC    Component Value Date/Time   WBC 5.0 03/13/2023 1207   WBC 5.7 09/05/2022 1029   RBC 5.31 (H) 03/13/2023 1207   RBC 4.76 09/05/2022 1029   HGB 12.6 03/13/2023 1207   HCT 40.6 03/13/2023 1207   PLT 306 03/13/2023 1207   MCV 77 (L) 03/13/2023 1207   MCH 23.7 (L) 03/13/2023 1207   MCH 25.0 (L) 09/05/2022 1029   MCHC 31.0 (L) 03/13/2023 1207   MCHC 33.7 09/05/2022 1029   RDW 15.6 (H) 03/13/2023 1207   LYMPHSABS 2.2 09/05/2022 1029   LYMPHSABS 2.7 02/03/2020 1412   MONOABS 0.5 09/05/2022 1029   EOSABS 0.1 09/05/2022 1029   EOSABS 0.2 02/03/2020 1412   BASOSABS 0.1 09/05/2022 1029   BASOSABS 0.1 02/03/2020 1412    BMET    Component Value Date/Time   NA 143 03/30/2023 1046   K 3.7 03/30/2023 1046   CL 106 03/30/2023 1046   CO2 24 03/30/2023 1046   GLUCOSE 117 (H) 03/30/2023 1046   GLUCOSE 120 (H) 09/05/2022 1029   BUN 13 03/30/2023 1046   CREATININE 0.81 03/30/2023 1046   CREATININE 0.92 09/12/2017 1121   CALCIUM 9.0 03/30/2023 1046   GFRNONAA >60 09/05/2022 1029   GFRNONAA 71 03/17/2017 1513   GFRAA 83 02/03/2020 1412   GFRAA 82 03/17/2017 1513    BNP No results found for: "BNP"   Imaging:  MR ABDOMEN MRCP W WO CONTAST  Result Date: 04/26/2023 CLINICAL DATA:  Characterize pancreatic cystic lesion incidentally identified by prior CT EXAM: MRI ABDOMEN WITHOUT AND WITH CONTRAST (INCLUDING MRCP) TECHNIQUE: Multiplanar multisequence MR imaging of the abdomen was performed  both before and after the administration of intravenous contrast. Heavily T2-weighted images of the biliary and pancreatic ducts were obtained, and three-dimensional MRCP images were rendered by post processing. CONTRAST:  9mL GADAVIST GADOBUTROL 1 MMOL/ML IV SOLN COMPARISON:  CT abdomen pelvis, 04/13/2023 FINDINGS: Lower chest: No acute abnormality. Hepatobiliary: No focal liver abnormality is seen. Status post cholecystectomy. No biliary dilatation. Pancreas: Heterogeneous fluid signal cystic lesion of the ventral pancreatic tail measuring 1.3 x 0.8 cm (series 4, image 15). There appears to be some enhancing nodularity along the ventral aspect of the lesion measuring 0.7 x 0.4 cm (series 2, image 35). No pancreatic ductal dilatation or surrounding inflammatory changes. Spleen: Normal in size without significant abnormality. Adrenals/Urinary Tract: Adrenal glands are unremarkable. Kidneys are normal, without renal calculi, solid lesion, or hydronephrosis. Stomach/Bowel: Stomach is within normal limits. No evidence of bowel wall thickening, distention, or inflammatory changes. Descending colonic diverticulosis. Vascular/Lymphatic: No significant vascular findings are present. No enlarged abdominal lymph nodes. Other: Small fat containing umbilical hernia.  No ascites. Musculoskeletal: No acute or significant osseous findings. IMPRESSION: 1. Heterogeneous fluid signal cystic lesion of the ventral pancreatic tail measuring 1.3 x 0.8 cm. There appears to be some enhancing nodularity along the ventral aspect of the lesion measuring 0.7 x 0.4 cm. Given internal complexity consider EUS/FNA for tissue diagnosis or at minimum follow-up MR in 6 months to ensure stability. 2. Status post cholecystectomy. 3. Descending colonic diverticulosis. Electronically Signed   By: Jearld Lesch M.D.   On: 04/26/2023 07:03   MR 3D Recon At Scanner  Result Date: 04/26/2023 CLINICAL DATA:  Characterize pancreatic cystic lesion  incidentally identified by prior CT EXAM: MRI ABDOMEN WITHOUT AND WITH CONTRAST (INCLUDING MRCP) TECHNIQUE: Multiplanar multisequence MR imaging of the abdomen was performed both before and after the administration of intravenous contrast. Heavily T2-weighted images of the biliary and pancreatic ducts were obtained, and three-dimensional MRCP images were rendered by post processing. CONTRAST:  9mL GADAVIST GADOBUTROL 1 MMOL/ML IV SOLN COMPARISON:  CT abdomen pelvis, 04/13/2023 FINDINGS: Lower chest: No acute abnormality. Hepatobiliary: No focal liver abnormality is seen. Status post cholecystectomy. No biliary dilatation. Pancreas: Heterogeneous fluid signal cystic lesion of the ventral pancreatic tail measuring 1.3 x 0.8 cm (series 4, image 15). There appears to be some enhancing nodularity along the ventral aspect of the lesion measuring 0.7 x 0.4 cm (series 2, image 35). No pancreatic ductal dilatation or surrounding inflammatory changes. Spleen: Normal in size without significant abnormality. Adrenals/Urinary Tract: Adrenal glands are unremarkable. Kidneys are normal, without renal calculi, solid lesion, or hydronephrosis. Stomach/Bowel: Stomach is within normal limits. No evidence of bowel wall thickening, distention, or inflammatory changes. Descending colonic diverticulosis. Vascular/Lymphatic: No significant vascular findings are present. No enlarged abdominal lymph nodes. Other: Small fat containing umbilical hernia.  No ascites. Musculoskeletal: No acute or significant osseous findings. IMPRESSION: 1. Heterogeneous fluid signal cystic lesion of the ventral pancreatic tail measuring 1.3 x 0.8 cm. There appears to be some enhancing nodularity along the ventral aspect of the lesion measuring 0.7 x 0.4 cm. Given internal complexity consider EUS/FNA for tissue diagnosis or at minimum follow-up MR in 6 months to ensure stability. 2. Status post cholecystectomy. 3. Descending colonic diverticulosis.  Electronically Signed   By: Jearld Lesch M.D.   On: 04/26/2023 07:03   CT Abdomen Pelvis W Contrast  Result Date: 04/16/2023 CLINICAL DATA:  Right lower quadrant abdominal pain. EXAM: CT ABDOMEN AND PELVIS WITH CONTRAST TECHNIQUE: Multidetector CT imaging of the abdomen and  pelvis was performed using the standard protocol following bolus administration of intravenous contrast. RADIATION DOSE REDUCTION: This exam was performed according to the departmental dose-optimization program which includes automated exposure control, adjustment of the mA and/or kV according to patient size and/or use of iterative reconstruction technique. CONTRAST:  OMNIPAQUE IOHEXOL 300 MG/ML  SOLN COMPARISON:  Ultrasound February 15, 2023 FINDINGS: Lower chest: No acute abnormality. Hepatobiliary: No suspicious hepatic lesion. Gallbladder surgically absent. Mild prominence of the biliary tree is favored reservoir effect post cholecystectomy. Pancreas: No pancreatic ductal dilation or evidence of acute inflammation. Cystic lesion in the tail of the pancreas measures 15 mm on image 25/2. Spleen: No splenomegaly or focal splenic lesion. Adrenals/Urinary Tract: Bilateral adrenal glands appear normal. No hydronephrosis. Kidneys demonstrate symmetric enhancement. Urinary bladder is unremarkable for degree of distension. Stomach/Bowel: Radiopaque enteric contrast material traverses the ileocecal valve. Stomach is unremarkable for degree of distension. No pathologic dilation of small or large bowel. Normal appendix. No evidence of acute bowel inflammation. Vascular/Lymphatic: Normal caliber abdominal aorta. Smooth IVC contours. The portal, splenic and superior mesenteric veins are patent. No pathologically enlarged abdominal or pelvic lymph nodes. Reproductive: No suspicious adnexal mass. Heterogeneity of the cervix may reflect a combination of nabothian cysts and fluid in the endocervical canal, but is nonspecific. Other: No significant  abdominopelvic free fluid. Fat containing supraumbilical ventral hernia and fat containing umbilical hernia. Musculoskeletal: No aggressive lytic or blastic lesion of bone. IMPRESSION: 1. No acute abnormality in the abdomen or pelvis. 2. Heterogeneity of the cervix may reflect a combination of nabothian cysts and fluid in the endocervical canal, but is nonspecific. Recommend correlation with physical exam and Pap smear. 3. Cystic lesion in the tail of the pancreas measures 15 mm, possibly a side branch IPMN. Recommend follow-up pre and post contrast MRI/MRCP. 4. Fat containing supraumbilical ventral hernia and fat containing umbilical hernia. Electronically Signed   By: Maudry Mayhew M.D.   On: 04/16/2023 10:15    cloNIDine (CATAPRES) tablet 0.1 mg     Date Action Dose Route User   03/13/2023 1424 Given 0.1 mg Oral (Other) Renelda Loma, RMA           No data to display          No results found for: "NITRICOXIDE"      Assessment & Plan:   Chronic insomnia No evidence of OSA on previous sleep study.  Struggled with insomnia since her initial stroke. She is on a topamax and depakote at night. She was on Ambien previously without good success. We made the shared decision to change her to [facility]. She has only used low dose of 1 mg. Not having good results. Reviewed with Dr. Maple Hudson. Will increase her to 3 mg nightly to see if she has better response. If not, will consider alternative class. Medication side effect profile reviewed. Cautioned to not drive after taking.   Patient Instructions  Increase Lunesta to 3 mg At bedtime for sleep. Do not drive after taking    Keep a sleep diary to help you and your health care provider figure out what could be causing your insomnia. Write down: When you sleep. When you wake up during the night. How well you sleep and how rested you feel the next day. Any side effects of medicines you are taking. What you eat and drink. Cool, dark sleeping  environment. Limit screen use before bedtime.Stick to a routine that includes going to bed and waking up at the same times  every day and night. This can help you fall asleep faster. Consider making a quiet activity, such as reading, part of your nighttime routine.  Call me to let me know how the increased dose is working next week    Follow up in 6 weeks to see how things are going with Dr. Wynona Neat or Dr. Maple Hudson. If symptoms do not improve, please contact office for sooner follow up     I spent 38 minutes of dedicated to the care of this patient on the date of this encounter to include pre-visit review of records, face-to-face time with the patient discussing conditions above, post visit ordering of testing, clinical documentation with the electronic health record, making appropriate referrals as documented, and communicating necessary findings to members of the patients care team.  Noemi Chapel, NP 05/04/2023  Pt aware and understands NP's role.

## 2023-05-08 ENCOUNTER — Other Ambulatory Visit (HOSPITAL_BASED_OUTPATIENT_CLINIC_OR_DEPARTMENT_OTHER): Payer: Self-pay

## 2023-05-08 ENCOUNTER — Ambulatory Visit (INDEPENDENT_AMBULATORY_CARE_PROVIDER_SITE_OTHER): Payer: 59 | Admitting: Neurology

## 2023-05-08 ENCOUNTER — Other Ambulatory Visit (HOSPITAL_COMMUNITY): Payer: Self-pay

## 2023-05-08 ENCOUNTER — Other Ambulatory Visit: Payer: Self-pay

## 2023-05-08 ENCOUNTER — Encounter: Payer: Self-pay | Admitting: Neurology

## 2023-05-08 VITALS — BP 137/89 | HR 61 | Ht 63.0 in | Wt 204.2 lb

## 2023-05-08 DIAGNOSIS — F445 Conversion disorder with seizures or convulsions: Secondary | ICD-10-CM | POA: Diagnosis not present

## 2023-05-08 DIAGNOSIS — M5440 Lumbago with sciatica, unspecified side: Secondary | ICD-10-CM

## 2023-05-08 DIAGNOSIS — Z8673 Personal history of transient ischemic attack (TIA), and cerebral infarction without residual deficits: Secondary | ICD-10-CM

## 2023-05-08 DIAGNOSIS — R519 Headache, unspecified: Secondary | ICD-10-CM

## 2023-05-08 DIAGNOSIS — R29898 Other symptoms and signs involving the musculoskeletal system: Secondary | ICD-10-CM

## 2023-05-08 MED ORDER — DIVALPROEX SODIUM ER 500 MG PO TB24
500.0000 mg | ORAL_TABLET | Freq: Two times a day (BID) | ORAL | 3 refills | Status: DC
Start: 2023-05-08 — End: 2023-07-07
  Filled 2023-05-08: qty 180, 90d supply, fill #0

## 2023-05-08 MED ORDER — EMGALITY 120 MG/ML ~~LOC~~ SOAJ
1.0000 | SUBCUTANEOUS | 11 refills | Status: DC
  Filled 2023-05-08: qty 1, 30d supply, fill #0
  Filled 2023-06-06 – 2023-07-24 (×2): qty 1, 30d supply, fill #1

## 2023-05-08 MED ORDER — TOPIRAMATE 100 MG PO TABS
ORAL_TABLET | ORAL | 3 refills | Status: AC
Start: 2023-05-08 — End: ?
  Filled 2023-05-08: qty 270, 90d supply, fill #0
  Filled 2023-07-24 – 2023-08-02 (×2): qty 90, 30d supply, fill #0
  Filled 2023-08-28: qty 90, 30d supply, fill #1

## 2023-05-08 MED ORDER — NORTRIPTYLINE HCL 25 MG PO CAPS
75.0000 mg | ORAL_CAPSULE | Freq: Every evening | ORAL | 3 refills | Status: DC
Start: 2023-05-08 — End: 2023-07-07
  Filled 2023-06-06: qty 270, 90d supply, fill #0

## 2023-05-08 NOTE — Progress Notes (Signed)
Medication Samples have been provided to the patient.  Drug name: emgality       Strength: 120        Qty: 2  LOT: [id number]  Exp.Date: 12/16/24  Dosing instructions: 1 dose loading dose 2 injections  The patient has been instructed regarding the correct time, dose, and frequency of taking this medication, including desired effects and most common side effects.   Dorothy Spark 3:35 PM 05/08/2023

## 2023-05-08 NOTE — Patient Instructions (Signed)
Good to see you.  Start Emgality injection once a month to hopefully help cut down on headaches  2. Continue all your medications  3. Schedule MRI lumbar spine without contrast  4. Continue home balance exercises  5. Follow-up in 4-5 months, call for any changes
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
## 2023-05-09 ENCOUNTER — Other Ambulatory Visit: Payer: Self-pay

## 2023-05-10 ENCOUNTER — Other Ambulatory Visit: Payer: 59

## 2023-05-10 ENCOUNTER — Other Ambulatory Visit (HOSPITAL_COMMUNITY): Payer: Self-pay

## 2023-05-10 ENCOUNTER — Other Ambulatory Visit: Payer: Self-pay

## 2023-05-10 ENCOUNTER — Other Ambulatory Visit: Payer: Self-pay | Admitting: Family Medicine

## 2023-05-10 DIAGNOSIS — I10 Essential (primary) hypertension: Secondary | ICD-10-CM | POA: Diagnosis not present

## 2023-05-10 NOTE — Progress Notes (Signed)
Orders Placed This Encounter  Procedures   CMP and Liver    Standing Status:   Future    Standing Expiration Date:   05/09/2024   Nolon Nations  APRN, MSN, FNP-C Patient Care [facility] [address] [place], [place] [address] [phone]

## 2023-05-11 ENCOUNTER — Other Ambulatory Visit (HOSPITAL_COMMUNITY): Payer: Self-pay

## 2023-05-11 ENCOUNTER — Other Ambulatory Visit: Payer: Self-pay

## 2023-05-11 LAB — CMP AND LIVER
ALT: 11 IU/L (ref 0–32)
AST: 12 IU/L (ref 0–40)
Albumin: 4.1 g/dL (ref 3.9–4.9)
Alkaline Phosphatase: 67 IU/L (ref 44–121)
BUN: 10 mg/dL (ref 6–24)
Bilirubin Total: 0.3 mg/dL (ref 0.0–1.2)
Bilirubin, Direct: <0.1 mg/dL (ref 0.00–0.40)
CO2: 26 mmol/L (ref 20–29)
Calcium: 9.3 mg/dL (ref 8.7–10.2)
Chloride: 106 mmol/L (ref 96–106)
Creatinine, Ser: 0.94 mg/dL (ref 0.57–1.00)
Glucose: 89 mg/dL (ref 70–99)
Potassium: 3.8 mmol/L (ref 3.5–5.2)
Sodium: 143 mmol/L (ref 134–144)
Total Protein: 6.2 g/dL (ref 6.0–8.5)
eGFR: 74 mL/min/{1.73_m2} (ref >59)

## 2023-05-12 ENCOUNTER — Other Ambulatory Visit: Payer: Self-pay

## 2023-05-17 ENCOUNTER — Ambulatory Visit: Payer: Medicaid Other | Admitting: Clinical

## 2023-05-17 DIAGNOSIS — F419 Anxiety disorder, unspecified: Secondary | ICD-10-CM

## 2023-05-17 DIAGNOSIS — F3289 Other specified depressive episodes: Secondary | ICD-10-CM | POA: Diagnosis not present

## 2023-05-17 NOTE — BH Specialist Note (Signed)
Integrated Behavioral Health via Telemedicine Visit  05/19/2023 Sharon Chan [id number]  Number of Integrated Behavioral Health Clinician visits: Additional Visit  Session Start time: 1105   Session End time: 1205  Total time in minutes: 60  Referring Provider: Angus Seller, NP Patient/Family location: [address], [facility] Provider location: [facility] All persons participating in visit: patient, CSW Types of Service: Individual psychotherapy and Video visit  I connected with Sharon Chan via Engineer, civil (consulting)  (Video is Surveyor, mining) and verified that I am speaking with the correct person using two identifiers. Discussed confidentiality: Yes   I discussed the limitations of telemedicine and the availability of in person appointments.  Discussed there is a possibility of technology failure and discussed alternative modes of communication if that failure occurs.  I discussed that engaging in this telemedicine visit, they consent to the provision of behavioral healthcare and the services will be billed under their insurance.  Patient and/or legal guardian expressed understanding and consented to Telemedicine visit: Yes   Presenting Concerns: Patient reports the following symptoms/concerns: depression and anxiety related to health concerns Duration of problem: several years; Severity of problem: moderate  Patient and/or Family's Strengths/Protective Factors: Social connections, Social and Emotional competence, Concrete supports in place (healthy food, safe environments, etc.), and Sense of purpose    Goals Addressed: Patient will:  Reduce symptoms of: anxiety and depression   Increase knowledge and/or ability of: coping skills and self-management skills   Demonstrate ability to: Increase healthy adjustment to current life circumstances  Progress towards Goals: Ongoing  Interventions: Interventions utilized:  CBT  Cognitive Behavioral Therapy and Supportive Counseling Standardized Assessments completed: Not Needed  Patient continues to work with [employer] [place] [facility]) on problems with her disability income. She is supposed to have a hearing over the phone with Disability Determination Services (DDS). She did update the housing authority that her income has stopped, so they now cover her full rent. Referred patient to [facility] for food delivery.  Supportive counseling provided around these financial challenges. Patient is also experiencing challenging dynamics with her fiance due to their disagreements over her financial issues. Processed some thoughts and emotions patient is having regarding their relationship.   Patient is also continuing to navigate her own health issues. Emotional validation and reflective listening provided.  Patient and/or Family Response: Patient engaged in session.   Assessment: Patient currently experiencing depression and anxiety related to health conditions and anxiety related to social situations.   Patient may benefit from CBT to explore unhelpful beliefs about self and re-frame these beliefs to improve mood and decrease anxiety.  Plan: Follow up with behavioral health clinician on: 06/01/23  I discussed the assessment and treatment plan with the patient and/or parent/guardian. They were provided an opportunity to ask questions and all were answered. They agreed with the plan and demonstrated an understanding of the instructions.   They were advised to call back or seek an in-person evaluation if the symptoms worsen or if the condition fails to improve as anticipated.  Abigail Butts, LCSW

## 2023-05-19 ENCOUNTER — Other Ambulatory Visit: Payer: Self-pay

## 2023-05-23 LAB — HM DIABETES EYE EXAM

## 2023-05-24 NOTE — Progress Notes (Deleted)
[name] [address] [place] [address] Phone: [phone] Subjective:    I'm seeing this patient by the request  of:  Ivonne Andrew, NP  CC:   [id number]  Sharon Chan is a 47 y.o. female coming in with complaint of low right abdominal pain  Onset-  Location Duration-  Character- Aggravating factors- Reliving factors-  Therapies tried-  Severity-     Past Medical History:  Diagnosis Date   Anxiety    CVA (cerebral vascular accident) (HCC)    Depression    DM type 2 (diabetes mellitus, type 2) (HCC)    HLD (hyperlipidemia)    HTN (hypertension)    Joint pain    Migraines    Neck pain    Obese    Polycystic disease, ovaries    Right sided weakness    Seizures (HCC)    Shoulder pain    Sickle cell trait (HCC)    Thyroid nodule    Vitamin D deficiency 01/2020   Past Surgical History:  Procedure Laterality Date   LAPAROSCOPIC CHOLECYSTECTOMY     Social History   Socioeconomic History   Marital status: Single    Spouse name: Not on file   Number of children: 0   Years of education: Not on file   Highest education level: Not on file  Occupational History   Not on file  Tobacco Use   Smoking status: Never   Smokeless tobacco: Never  Vaping Use   Vaping Use: Never used  Substance and Sexual Activity   Alcohol use: No   Drug use: No   Sexual activity: Not Currently  Other Topics Concern   Not on file  Social History Narrative   Lives at home, mom currently staying with her   Left-handed   Caffeine: occasional decaf coffee or raspberry tea   One story home   Social Determinants of Health   Financial Resource Strain: High Risk (02/12/2023)   Overall Financial Resource Strain (CARDIA)    Difficulty of Paying Living Expenses: Hard  Food Insecurity: Food Insecurity Present (02/12/2023)   Hunger Vital Sign    Worried About Running Out of Food in the Last Year: Often true    Ran Out of Food in  the Last Year: Often true  Transportation Needs: Patient Declined (02/12/2023)   PRAPARE - Administrator, Civil Service (Medical): Patient declined    Lack of Transportation (Non-Medical): Patient declined  Physical Activity: Unknown (02/12/2023)   Exercise Vital Sign    Days of Exercise per Week: Patient declined    Minutes of Exercise per Session: 20 min  Recent Concern: Physical Activity - Insufficiently Active (12/22/2022)   Exercise Vital Sign    Days of Exercise per Week: 3 days    Minutes of Exercise per Session: 20 min  Stress: No Stress Concern Present (02/12/2023)   [employer] of Occupational Health - Occupational Stress Questionnaire    Feeling of Stress : Only a little  Social Connections: Unknown (02/12/2023)   Social Connection and Isolation Panel [NHANES]    Frequency of Communication with Friends and Family: Once a week    Frequency of Social Gatherings with Friends and Family: Patient declined    Attends Religious Services: More than 4 times per year    Active Member of [employer] or Organizations: Yes    Attends Banker Meetings: Never    Marital Status: Never married   Allergies  Allergen  Reactions   Penicillins Anaphylaxis   Other Itching, Rash and Other (See Comments)    Strong sunlight - polymorphous light eruption   Family History  Problem Relation Age of Onset   Colon polyps Mother    Heart attack Mother    Diabetes Mother    Hypertension Mother    Hyperlipidemia Mother    Post-traumatic stress disorder Father        committed suicide   Diabetes Father    Hypertension Sister    Stomach cancer Maternal Aunt    Breast cancer Maternal Grandmother    Breast cancer Paternal Grandmother    Thyroid cancer Paternal Grandmother    Colon cancer Neg Hx    Esophageal cancer Neg Hx    Pancreatic cancer Neg Hx     Current Outpatient Medications (Endocrine & Metabolic):    Semaglutide,0.25 or 0.5MG /DOS, (OZEMPIC, 0.25 OR 0.5 MG/DOSE,)  2 MG/3ML SOPN, Inject 0.25 mg weekly for 4 weeks, then increase to 0.5 mg weekly  Current Outpatient Medications (Cardiovascular):    amLODipine (NORVASC) 10 MG tablet, Take 1 tablet (10 mg total) by mouth every evening.   atorvastatin (LIPITOR) 80 MG tablet, Take 1 tablet (80 mg total) by mouth daily.   spironolactone (ALDACTONE) 25 MG tablet, Take 1 tablet (25 mg total) by mouth daily.   valsartan-hydrochlorothiazide (DIOVAN-HCT) 320-12.5 MG tablet, Take 1 tablet by mouth daily.  Current Outpatient Medications (Respiratory):    albuterol (VENTOLIN HFA) 108 (90 Base) MCG/ACT inhaler, Inhale 2 puffs into the lungs every 6 (six) hours as needed for wheezing or shortness of breath.   diphenhydrAMINE (BENADRYL) 25 MG tablet, Take 25 mg by mouth every 6 (six) hours as needed for allergies.  Current Outpatient Medications (Analgesics):    aspirin EC 81 MG tablet, Take 1 tablet (81 mg total) by mouth in the morning.   [START ON 06/07/2023] Galcanezumab-gnlm (EMGALITY) 120 MG/ML SOAJ, Inject 1 Pen into the skin every 30 (thirty) days.   Current Outpatient Medications (Other):    blood glucose meter kit and supplies KIT, Dispense based on patient and insurance preference. Use up to four times daily as directed. (FOR ICD-9 250.00, 250.01).   cyclobenzaprine (FLEXERIL) 5 MG tablet, TAKE ONE TABLET BY MOUTH THREE TIMES DAILY AS NEEDED (VIAL)   divalproex (DEPAKOTE ER) 500 MG 24 hr tablet, Take 1 tablet (500 mg) by mouth 2 times daily.   eszopiclone (LUNESTA) 1 MG TABS tablet, Take 1 tablet (1 mg total) by mouth at bedtime as needed for sleep. Take immediately before bedtime   nortriptyline (PAMELOR) 25 MG capsule, Take 3 capsules (75 mg total) by mouth every evening.   nortriptyline (PAMELOR) 25 MG capsule, Take 3 capsules (75 mg total) by mouth every evening.   OneTouch Delica Lancets 33G MISC, TES 2 (TWO) TIMES DAILY.   topiramate (TOPAMAX) 100 MG tablet, Take 1 tablet (100 mg) by mouth in the morning  AND 2 tablets (200 mg) every evening.   Reviewed prior external information including notes and imaging from  primary care provider As well as notes that were available from care everywhere and other healthcare systems.  Past medical history, social, surgical and family history all reviewed in electronic medical record.  No pertanent information unless stated regarding to the chief complaint.   Review of Systems:  No headache, visual changes, nausea, vomiting, diarrhea, constipation, dizziness, abdominal pain, skin rash, fevers, chills, night sweats, weight loss, swollen lymph nodes, body aches, joint swelling, chest pain, shortness of breath, mood  changes. POSITIVE muscle aches  Objective  There were no vitals taken for this visit.   General: No apparent distress alert and oriented x3 mood and affect normal, dressed appropriately.  HEENT: Pupils equal, extraocular movements intact  Respiratory: Patient's speak in full sentences and does not appear short of breath  Cardiovascular: No lower extremity edema, non tender, no erythema      Impression and Recommendations:

## 2023-05-26 ENCOUNTER — Ambulatory Visit: Payer: 59 | Admitting: Family Medicine

## 2023-05-29 ENCOUNTER — Other Ambulatory Visit (HOSPITAL_COMMUNITY): Payer: Self-pay

## 2023-05-29 ENCOUNTER — Telehealth: Payer: Self-pay

## 2023-05-29 NOTE — Telephone Encounter (Signed)
Pt was advised of eye exam. [facility]

## 2023-06-01 ENCOUNTER — Ambulatory Visit (INDEPENDENT_AMBULATORY_CARE_PROVIDER_SITE_OTHER): Payer: Medicaid Other | Admitting: Cardiovascular Disease

## 2023-06-01 ENCOUNTER — Other Ambulatory Visit (INDEPENDENT_AMBULATORY_CARE_PROVIDER_SITE_OTHER): Payer: Medicaid Other

## 2023-06-01 ENCOUNTER — Other Ambulatory Visit: Payer: Self-pay

## 2023-06-01 ENCOUNTER — Ambulatory Visit: Payer: Medicaid Other | Admitting: Clinical

## 2023-06-01 ENCOUNTER — Other Ambulatory Visit (HOSPITAL_COMMUNITY): Payer: Self-pay

## 2023-06-01 ENCOUNTER — Encounter (HOSPITAL_BASED_OUTPATIENT_CLINIC_OR_DEPARTMENT_OTHER): Payer: Self-pay | Admitting: Cardiovascular Disease

## 2023-06-01 VITALS — BP 140/94 | HR 75 | Ht 63.0 in | Wt 203.2 lb

## 2023-06-01 DIAGNOSIS — I10 Essential (primary) hypertension: Secondary | ICD-10-CM | POA: Diagnosis not present

## 2023-06-01 DIAGNOSIS — F3289 Other specified depressive episodes: Secondary | ICD-10-CM

## 2023-06-01 DIAGNOSIS — Z006 Encounter for examination for normal comparison and control in clinical research program: Secondary | ICD-10-CM

## 2023-06-01 DIAGNOSIS — F419 Anxiety disorder, unspecified: Secondary | ICD-10-CM

## 2023-06-01 DIAGNOSIS — R002 Palpitations: Secondary | ICD-10-CM | POA: Diagnosis not present

## 2023-06-01 DIAGNOSIS — E781 Pure hyperglyceridemia: Secondary | ICD-10-CM | POA: Diagnosis not present

## 2023-06-01 DIAGNOSIS — E119 Type 2 diabetes mellitus without complications: Secondary | ICD-10-CM | POA: Diagnosis not present

## 2023-06-01 DIAGNOSIS — I6930 Unspecified sequelae of cerebral infarction: Secondary | ICD-10-CM | POA: Diagnosis not present

## 2023-06-01 DIAGNOSIS — R079 Chest pain, unspecified: Secondary | ICD-10-CM

## 2023-06-01 HISTORY — DX: Chest pain, unspecified: R07.9

## 2023-06-01 HISTORY — DX: Palpitations: R00.2

## 2023-06-01 MED ORDER — REPATHA SURECLICK 140 MG/ML ~~LOC~~ SOAJ
140.0000 mg | SUBCUTANEOUS | 3 refills | Status: DC
  Filled 2023-06-01 – 2023-07-24 (×2): qty 6, 84d supply, fill #0

## 2023-06-01 MED ORDER — METOPROLOL TARTRATE 100 MG PO TABS
ORAL_TABLET | ORAL | 0 refills | Status: DC
  Filled 2023-06-01 (×2): qty 1, 1d supply, fill #0

## 2023-06-01 MED ORDER — SPIRONOLACTONE 50 MG PO TABS
50.0000 mg | ORAL_TABLET | Freq: Every day | ORAL | 3 refills | Status: DC
  Filled 2023-06-01 (×2): qty 90, 90d supply, fill #0
  Filled 2023-08-23 – 2023-08-24 (×2): qty 90, 90d supply, fill #1
  Filled 2023-08-28: qty 30, 30d supply, fill #1

## 2023-06-01 NOTE — Progress Notes (Signed)
[facility] Initial Assessment:    Date:  06/01/2023   ID:  Sharon Chan, DOB 07-24-1976, MRN [id number]  PCP:  Ivonne Andrew, NP  Cardiologist:  None  Nephrologist:  Referring MD: Ivonne Andrew, NP   CC: Hypertension  History of Present Illness:    Sharon Chan is a 47 y.o. female with a hx of hypertension, CVA, hyperlipidemia, diabetes, peripheral neuropathy, polycystic kidney disease, sickle cell trait, vitamin B12 deficiency, here to establish care in the [facility].   She was seen by her PCP 03/13/2023 and her blood pressure was 162/99 on a regimen of amlodipine 5 mg daily, valsartan-HCTZ 320-12.5 mg daily. Home blood pressures were elevated as well. She complained of chronic headaches, some LE edema. Amlodipine was increased to 10 mg daily. Also noted having multiple falls with her high blood pressure. Due to concern for stroke, brain MRI was ordered and was negative. She was seen by their pharmacist 03/16/2023 and had blood pressures in the 170-180s with validated home BP cuff. She was started on spironolactone 25 mg daily and referred to the [facility]. Atorvastatin was also increased to 80 mg daily. She had an echo 08/2022 that revealed LVEF 60-65% and mild LVH. She had a sleep study that was negative for sleep apnea.  Today, she states she is feeling stressed. 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 For a little over one week she complains of general left-sided chest pain, independent of her walking. She usually only feels short of breath when she is exercising. She does exercise by walking twice a day, 1.5 hours in the mornings and 30 minutes in the evenings. Initially her chest pain was lasting for hours at a time, but has become more constant over the past 3 days. She complains of associated diaphoresis and dizziness, but denies nausea. Confirms having palpitations and heart racing  episodes daily, lasting from 5 to 15 minutes on average. Her palpitations are random with no clear triggers. In the office today her blood pressure is 131/89, and 140/94 on manual recheck. Home blood pressures have been high and labile; she consistently sees 140s/80s and higher at home. She reports that she is improving with taking her medications. She now has alarms on her phone to help her take them on time. Since her prior stroke in 2018 she has struggled with residual symptoms including persistent numbness of her right side. She also has chronic insomnia, states that she does well to get 2 hours of quality sleep a night. More recently her fatigue seems to be worsening, with daily headaches and brain fog. Since her stroke she also never feels hungry, and therefore often only eats one meal a day. She does prepare her own meals. She denies any peripheral edema, syncope, orthopnea, or PND.  Previous antihypertensives: N/a  Past Medical History:  Diagnosis Date   Anxiety    CVA (cerebral vascular accident) (HCC)    Depression    DM type 2 (diabetes mellitus, type 2) (HCC)    HLD (hyperlipidemia)    HTN (hypertension)    Joint pain    Migraines    Neck pain    Obese    Polycystic disease, ovaries    Right sided weakness    Seizures (HCC)    Shoulder pain    Sickle cell trait (HCC)    Thyroid nodule    Vitamin D deficiency 01/2020    Past Surgical History:  Procedure Laterality Date   LAPAROSCOPIC CHOLECYSTECTOMY      Current Medications: Current Meds  Medication Sig   albuterol (VENTOLIN HFA) 108 (90 Base) MCG/ACT inhaler Inhale 2 puffs into the lungs every 6 (six) hours as needed for wheezing or shortness of breath.   amLODipine (NORVASC) 10 MG tablet Take 1 tablet (10 mg total) by mouth every evening.   aspirin EC 81 MG tablet Take 1 tablet (81 mg total) by mouth in the morning.   atorvastatin (LIPITOR) 80 MG tablet Take 1 tablet (80 mg total) by mouth daily.   blood glucose  meter kit and supplies KIT Dispense based on patient and insurance preference. Use up to four times daily as directed. (FOR ICD-9 250.00, 250.01).   cyclobenzaprine (FLEXERIL) 5 MG tablet TAKE ONE TABLET BY MOUTH THREE TIMES DAILY AS NEEDED (VIAL)   diphenhydrAMINE (BENADRYL) 25 MG tablet Take 25 mg by mouth every 6 (six) hours as needed for allergies.   divalproex (DEPAKOTE ER) 500 MG 24 hr tablet Take 1 tablet (500 mg) by mouth 2 times daily.   eszopiclone (LUNESTA) 1 MG TABS tablet Take 1 tablet (1 mg total) by mouth at bedtime as needed for sleep. Take immediately before bedtime   [START ON 06/07/2023] Galcanezumab-gnlm (EMGALITY) 120 MG/ML SOAJ Inject 1 Pen into the skin every 30 (thirty) days.   nortriptyline (PAMELOR) 25 MG capsule Take 3 capsules (75 mg total) by mouth every evening.   OneTouch Delica Lancets 33G MISC TES 2 (TWO) TIMES DAILY.   Semaglutide,0.25 or 0.5MG /DOS, (OZEMPIC, 0.25 OR 0.5 MG/DOSE,) 2 MG/3ML SOPN Inject 0.25 mg weekly for 4 weeks, then increase to 0.5 mg weekly   spironolactone (ALDACTONE) 25 MG tablet Take 1 tablet (25 mg total) by mouth daily.   topiramate (TOPAMAX) 100 MG tablet Take 1 tablet (100 mg) by mouth in the morning AND 2 tablets (200 mg) every evening.   valsartan-hydrochlorothiazide (DIOVAN-HCT) 320-12.5 MG tablet Take 1 tablet by mouth daily.   [DISCONTINUED] nortriptyline (PAMELOR) 25 MG capsule Take 3 capsules (75 mg total) by mouth every evening.     Allergies:   Penicillins and Other   Social History   Socioeconomic History   Marital status: Single    Spouse name: Not on file   Number of children: 0   Years of education: Not on file   Highest education level: Not on file  Occupational History   Not on file  Tobacco Use   Smoking status: Never   Smokeless tobacco: Never  Vaping Use   Vaping status: Never Used  Substance and Sexual Activity   Alcohol use: No   Drug use: No   Sexual activity: Not Currently  Other Topics Concern   Not  on file  Social History Narrative   Lives at home, mom currently staying with her   Left-handed   Caffeine: occasional decaf coffee or raspberry tea   One story home   Social Determinants of Health   Financial Resource Strain: High Risk (02/12/2023)   Overall Financial Resource Strain (CARDIA)    Difficulty of Paying Living Expenses: Hard  Food Insecurity: Food Insecurity Present (02/12/2023)   Hunger Vital Sign    Worried About Running Out of Food in the Last Year: Often true    Ran Out of Food in the Last Year: Often true  Transportation Needs: Patient Declined (02/12/2023)   PRAPARE - Administrator, Civil Service (Medical): Patient declined    Lack of Transportation (Non-Medical): Patient  declined  Physical Activity: Unknown (02/12/2023)   Exercise Vital Sign    Days of Exercise per Week: Patient declined    Minutes of Exercise per Session: 20 min  Recent Concern: Physical Activity - Insufficiently Active (12/22/2022)   Exercise Vital Sign    Days of Exercise per Week: 3 days    Minutes of Exercise per Session: 20 min  Stress: No Stress Concern Present (02/12/2023)   [employer] of Occupational Health - Occupational Stress Questionnaire    Feeling of Stress : Only a little  Social Connections: Unknown (02/12/2023)   Social Connection and Isolation Panel [NHANES]    Frequency of Communication with Friends and Family: Once a week    Frequency of Social Gatherings with Friends and Family: Patient declined    Attends Religious Services: More than 4 times per year    Active Member of [employer] or Organizations: Yes    Attends Banker Meetings: Never    Marital Status: Never married     Family History: The patient's family history includes Breast cancer in her maternal grandmother and paternal grandmother; Colon polyps in her mother; Diabetes in her father and mother; Heart attack (age of onset: 71) in her mother; Hyperlipidemia in her mother; Hypertension in  her mother and sister; Post-traumatic stress disorder in her father; Stomach cancer in her maternal aunt; Thyroid cancer in her paternal grandmother. There is no history of Colon cancer, Esophageal cancer, or Pancreatic cancer.  ROS:   Please see the history of present illness.    (+) Stress (+) Left-sided chest pain (+) Shortness of breath (+) Diaphoresis (+) Dizziness (+) Palpitations (+) Residual right-sided numbness (+) Chronic insomnia and fatigue (+) Headaches (+) Brain fog (+) Loss of appetite All other systems reviewed and are negative.  EKGs/Labs/Other Studies Reviewed:    Echo addendum with bubble study 08/12/2022:  1. Normal LV systolic function with visual EF 60-65%. Left ventricle  cavity is normal in size. Normal global wall  motion. Normal diastolic filling pattern, normal LAP. Mild left  ventricular hypertrophy.  2. Agitated saline contrast was injected into a peripheral vein. No  intracardiac shunt was seen. A patient  foramen ovale was not seen.  3. Mild (Grade I) aortic regurgitation.  4. The aortic root is normal. Mildly dilated proximal ascending aorta  40mm.  5. No prior study for comparison.   Monitor 07/2022:   Patient had a minimum heart rate of 51 bpm, maximum heart rate of 176 bpm, and average heart rate of 71 bpm.   Predominant underlying rhythm was sinus rhythm.   Isolated PACs were rare (<1.0%).   Isolated PVCs were rare (<1.0%).   No evidence of significant heart block.   Triggered and diary events associated with sinus rhythm and sinus tachycardia.   No evidence of cardiac syncope.   EKG:  EKG is personally reviewed. 06/01/2023: Sinus rhythm. Rate 75 bpm.  Recent Labs: 09/05/2022: Magnesium 2.0 03/13/2023: Hemoglobin 12.6; Platelets 306 05/10/2023: ALT 11; BUN 10; Creatinine, Ser 0.94; Potassium 3.8; Sodium 143   Recent Lipid Panel    Component Value Date/Time   CHOL 191 03/30/2023 1046   TRIG 65 03/30/2023 1046   HDL 46 03/30/2023  1046   CHOLHDL 4.2 03/30/2023 1046   CHOLHDL 2.7 07/25/2017 1130   VLDL 27 02/26/2017 0341   LDLCALC 133 (H) 03/30/2023 1046   LDLCALC 70 07/25/2017 1130   LDLDIRECT 122 (H) 03/30/2023 1046    Physical Exam:    VS:  BP Marland Kitchen)  140/94 (BP Location: Right Arm, Patient Position: Sitting, Cuff Size: Large)   Pulse 75   Ht 5\' 3"  (1.6 m)   Wt 203 lb 3.2 oz (92.2 kg)   BMI 36.00 kg/m  , BMI Body mass index is 36 kg/m. GENERAL:  Well appearing HEENT: Pupils equal round and reactive, fundi not visualized, oral mucosa unremarkable NECK:  No jugular venous distention, waveform within normal limits, carotid upstroke brisk and symmetric, no bruits, no thyromegaly LUNGS:  Clear to auscultation bilaterally HEART:  RRR.  PMI not displaced or sustained, S1 and S2 within normal limits, no S3, no S4, no clicks, no rubs, no murmurs ABD:  Flat, positive bowel sounds normal in frequency in pitch, no bruits, no rebound, no guarding, no midline pulsatile mass, no hepatomegaly, no splenomegaly EXT:  2 plus pulses throughout, no edema, no cyanosis, no clubbing SKIN:  No rashes, no nodules NEURO:  Cranial nerves II through XII grossly intact, motor grossly intact throughout PSYCH:  Cognitively intact, oriented to person place and time   ASSESSMENT/PLAN:       # Chest Pain: Constant left-sided chest pain for the past week, exacerbated by exercise and associated with shortness of breath, nausea, dizziness, and sweating. Family history of early heart disease. EKG normal at this visit. -Order Cardiac CT to evaluate coronary arteries.   # Palpitations: Order 3-day cardiac monitor to evaluate daily palpitations.  Check TSH and BMP.  Catecholamines and metanephrines.  # Hypertension: Home blood pressure readings in the 140s/80s. Uncontrolled in the office as well.  Stress and poor sleep may be contributing factors. -Double Spironolactone dose to 25mg  daily. -Check basic metabolic panel in one week. -Check TSH,  renin/aldosterone, am cortisol, catecholamines and metanephrines -Enroll in remote patient monitoring study for blood pressure management.  She consents to monitoring in the [employer] remote patient monitoring system.  # Hyperlipidemia: LDL 133, despite maximum dose of Atorvastatin.  Given prior CVA, LDL goal is <70 -Add Repatha injection every two weeks. -Recheck lipids/CMP in a few months.  General Health Maintenance: -Check for hormonal imbalances that could affect blood pressure control (renin, aldosterone, catecholamines, metanephrines). -Follow-up in four months, with monthly check-ins by pharmacist and nurse practitioner.       Screening for Secondary Hypertension:     06/01/2023   10:06 AM  Causes  Drugs/Herbals Screened     - Comments no EtoH, caffeine, tobacco.  Limits sodium.  Renovascular HTN Screened     - Comments Check renal artery Dopplers  Sleep Apnea Screened     - Comments no OSA on sleep study  Thyroid Disease Screened     - Comments check TSH  Hyperaldosteronism Screened     - Comments Check renin and aldo  Pheochromocytoma Screened     - Comments check catecholamines and metanephries  Cushing's Syndrome Screened     - Comments AM cortisol  Hyperparathyroidism Screened  Coarctation of the Aorta Screened     - Comments BP symmetric  Compliance Screened    Relevant Labs/Studies:    Latest Ref Rng & Units 05/10/2023    9:52 AM 03/30/2023   10:46 AM 03/13/2023   12:07 PM  Basic Labs  Sodium 134 - 144 mmol/L 143  143  142   Potassium 3.5 - 5.2 mmol/L 3.8  3.7  4.1   Creatinine 0.57 - 1.00 mg/dL 1.09  3.23  5.57        Latest Ref Rng & Units 02/03/2020    2:12 PM 12/03/2018  2:16 PM  Thyroid   TSH 0.450 - 4.500 uIU/mL 0.747  0.57    Disposition:    FU with APP/PharmD in 1 month for the next 3 months.   FU with Spenser C. Duke Salvia, MD, [facility] in 4 months.  Medication Adjustments/Labs and Tests Ordered: Current medicines are reviewed at length with the  patient today.  Concerns regarding medicines are outlined above.   Orders Placed This Encounter  Procedures   LONG TERM MONITOR (3-14 DAYS)   EKG 12-Lead   No orders of the defined types were placed in this encounter.  I,Mathew Stumpf,acting as a Neurosurgeon for Chilton Si, MD.,have documented all relevant documentation on the behalf of Sharon Newman, MD,as directed by  Chilton Si, MD while in the presence of Chilton Si, MD.  I, Kandee C. Duke Salvia, MD have reviewed all documentation for this visit.  The documentation of the exam, diagnosis, procedures, and orders on 06/01/2023 are all accurate and complete.   Signed, Chilton Si, MD  06/01/2023 10:29 AM    [facility]

## 2023-06-01 NOTE — Addendum Note (Signed)
Addended by: Chilton Si C on: 06/01/2023 04:39 PM   Modules accepted: Orders

## 2023-06-01 NOTE — Patient Instructions (Addendum)
Medication Instructions:  INCREASE YOUR SPIRONOLACTONE TO 50 MG DAILY   TAKE METOPROLOL 100 MG 1 TABLET 2 HOURS PRIOR TO CT   START REPATHA 140 MG INJECT EVERY 2 WEEKS   Labwork: IN 1 WEEK NEED TO GO FIRST THING IN THE MORNING   Testing/Procedures: Your physician has requested that you have cardiac CT. Cardiac computed tomography (CT) is a painless test that uses an x-ray machine to take clear, detailed pictures of your heart. For further information please visit [email or web address] Please follow instruction sheet as given.  3 DAY ZIO   Follow-Up: 1 MONTH WITH PHARM D   Any Other Special Instructions Will Be Listed Below (If Applicable).  MONITOR YOUR BLOOD PRESSURE TWICE A DAY WITH , LOG IN THE BOOK PROVIDED. BRING THE BOOK AND YOUR BLOOD PRESSURE MACHINE TO YOUR FOLLOW UP IN 1 MONTH     Your cardiac CT will be scheduled at one of the below locations:   [facility] [address] [place], [place] [address] [phone]  OR  [facility] [address] [place], [place] [address] [phone]  OR   [facility] [address] [place], [place] [address] [phone]  If scheduled at [facility], please arrive at the [facility] Children's Entrance (Entrance C2) of [facility] 30 minutes prior to test start time. You can use the FREE valet parking offered at entrance C (encouraged to control the heart rate for the test)  Proceed to the [facility] Radiology Department (first floor) to check-in and test prep.  All radiology patients and guests should use entrance C2 at [facility], accessed from [facility], even though the hospital's physical address listed is [address].    If scheduled at [facility] or [facility], please arrive 15 mins early for check-in and test prep.  There is  spacious parking and easy access to the radiology department from the [facility] Heart and Vascular entrance. Please enter here and check-in with the desk attendant.   Please follow these instructions carefully (unless otherwise directed):  On the Night Before the Test: Be sure to Drink plenty of water. Do not consume any caffeinated/decaffeinated beverages or chocolate 12 hours prior to your test. Do not take any antihistamines 12 hours prior to your test.  On the Day of the Test: Drink plenty of water until 1 hour prior to the test. Do not eat any food 1 hour prior to test. You may take your regular medications prior to the test.  Take metoprolol (Lopressor) two hours prior to test. If you take Furosemide/Hydrochlorothiazide/Spironolactone, please HOLD on the morning of the test. FEMALES- please wear underwire-free bra if available, avoid dresses & tight clothing      After the Test: Drink plenty of water. After receiving IV contrast, you may experience a mild flushed feeling. This is normal. On occasion, you may experience a mild rash up to 24 hours after the test. This is not dangerous. If this occurs, you can take Benadryl 25 mg and increase your fluid intake. If you experience trouble breathing, this can be serious. If it is severe call 911 IMMEDIATELY. If it is mild, please call our office. If you take any of these medications: Glipizide/Metformin, Avandament, Glucavance, please do not take 48 hours after completing test unless otherwise instructed.  We will call to schedule your test 2-4 weeks out understanding that some insurance companies  will need an authorization prior to the service being performed.   For more information and frequently asked questions, please visit our website : [email or web address]  For non-scheduling related questions, please contact the cardiac imaging nurse navigator should you have any questions/concerns: Cardiac Imaging Nurse Navigators Direct  Office Dial: [phone]   For scheduling needs, including cancellations and rescheduling, please call [place], [phone].  Cardiac CT Angiogram A cardiac CT angiogram is a procedure to look at the heart and the area around the heart. It may be done to help find the cause of chest pains or other symptoms of heart disease. During this procedure, a substance called contrast dye is injected into a vein in the arm. The contrast highlights the blood vessels in the area to be checked. A large X-ray machine (CT scanner), then takes detailed pictures of the heart and the surrounding area. The procedure is also sometimes called a coronary CT angiogram, coronary artery scanning, or CTA. A cardiac CT angiogram allows the health care provider to see how well blood is flowing to and from the heart. The provider will be able to see if there are any problems, such as: Blockage or narrowing of the arteries in the heart. Fluid around the heart. Signs of weakness or disease in the muscles, valves, and tissues of the heart. Tell a health care provider about: Any allergies you have. This is especially important if you have had a previous allergic reaction to medicines, contrast dye, or iodine. All medicines you are taking, including vitamins, herbs, eye drops, creams, and over-the-counter medicines. Any bleeding problems you have. Any surgeries you have had. Any medical conditions you have, including kidney problems or kidney failure. Whether you are pregnant or may be pregnant. Any anxiety disorders, chronic pain, or other conditions you have. These may increase your stress or prevent you from lying still. Any history of abnormal heart rhythms or heart procedures. What are the risks? Your provider will talk with you about risks. These may include: Bleeding. Infection. Allergic reactions to medicines or dyes. Damage to other structures or organs. Kidney damage from the contrast dye. Increased risk of  cancer from radiation exposure. This risk is low. Talk with your provider about: The risks and benefits of testing. How you can receive the lowest dose of radiation. What happens before the procedure? Wear comfortable clothing and remove any jewelry, glasses, dentures, and hearing aids. Follow instructions from your provider about eating and drinking. These may include: 12 hours before the procedure Avoid caffeine. This includes tea, coffee, soda, energy drinks, and diet pills. Drink plenty of water or other fluids that do not have caffeine in them. Being well hydrated can prevent complications. 4-6 hours before the procedure Stop eating and drinking. This will reduce the risk of nausea from the contrast dye. Ask your provider about changing or stopping your regular medicines. These include: Diabetes medicines. Medicines to treat problems with erections (erectile dysfunction). If you have kidney problems, you may need to receive IV hydration before and after the test. What happens during the procedure?  Hair on your chest may need to be removed so that small sticky patches called electrodes can be placed on your chest. These will transmit information that helps to monitor your heart during the procedure. An IV will be inserted into one of your veins. You might be given a medicine to control your heart rate during the procedure. This will help to ensure that good images are obtained. You will be asked to lie  on an exam table. This table will slide in and out of the CT machine during the procedure. Contrast dye will be injected into the IV. You might feel warm, or you may get a metallic taste in your mouth. You may be given medicines to relax or dilate the arteries in your heart. If you are allergic to contrast dyes or iodine you may be given medicine before the test to reduce the risk of an allergic reaction. The table that you are lying on will move into the CT machine tunnel for the  scan. The person running the machine will give you instructions while the scans are being done. You may be asked to: Keep your arms above your head. Hold your breath for short periods. Stay very still, even if the table is moving. The procedure may vary among providers and hospitals. What can I expect after the procedure? After your procedure, it is common to have: A metallic taste in your mouth from the contrast dye. A feeling of warmth. A headache from the heart medicine. Follow these instructions at home: Take over-the-counter and prescription medicines only as told by your provider. If you are told, drink enough fluid to keep your pee pale yellow. This will help to flush the contrast dye out of your body. Most people can return to their normal activities right after the procedure. Ask your provider what activities are safe for you. It is up to you to get the results of your procedure. Ask your provider, or the department that is doing the procedure, when your results will be ready. Contact a health care provider if: You have any symptoms of allergy to the contrast dye. These include: Shortness of breath. Rash or hives. A racing heartbeat. You notice a change in your peeing (urination). This information is not intended to replace advice given to you by your health care provider. Make sure you discuss any questions you have with your health care provider. Document Revised: 06/03/2022 Document Reviewed: 06/03/2022 Elsevier Patient Education  2024 [employer].

## 2023-06-01 NOTE — BH Specialist Note (Signed)
Integrated Behavioral Health via Telemedicine Visit  06/02/2023 Sharon Chan [id number]  Number of Integrated Behavioral Health Clinician visits: Additional Visit  Session Start time: 1400   Session End time: 1500  Total time in minutes: 60  Referring Provider: Angus Seller, NP Patient/Family location: [address], [facility] Provider location: [facility] All persons participating in visit: patient, CSW Types of Service: Individual psychotherapy and Video visit  I connected with Sharon Chan via Engineer, civil (consulting)  (Video is Surveyor, mining) and verified that I am speaking with the correct person using two identifiers. Discussed confidentiality: Yes   I discussed the limitations of telemedicine and the availability of in person appointments.  Discussed there is a possibility of technology failure and discussed alternative modes of communication if that failure occurs.  I discussed that engaging in this telemedicine visit, they consent to the provision of behavioral healthcare and the services will be billed under their insurance.  Patient and/or legal guardian expressed understanding and consented to Telemedicine visit: Yes   Presenting Concerns: Patient reports the following symptoms/concerns: depression and anxiety related to health concerns Duration of problem: several years; Severity of problem: moderate  Patient and/or Family's Strengths/Protective Factors: Social connections, Social and Emotional competence, Concrete supports in place (healthy food, safe environments, etc.), and Sense of purpose   Goals Addressed: Patient will:  Reduce symptoms of: anxiety and depression   Increase knowledge and/or ability of: coping skills and self-management skills   Demonstrate ability to: Increase healthy adjustment to current life circumstances  Progress towards Goals: Ongoing  Interventions: Interventions utilized:   Supportive Counseling Standardized Assessments completed: Not Needed  Supportive counseling today. Patient has a hearing with Social Security for her disability on Aug 26. She continues to work with the Legal Aid attorney on this. Supportive counseling provided around patient's ongoing medical concerns and how this impacts her mental health. 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 She also saw cardiology this morning. Provided reflective listening and emotional validation around patient's concerns. Reflected on things that help patient cope, including her faith and her relationship with dad, though she does provide care giving for him.   Patient and/or Family Response: Patient engaged in session.   Assessment: Patient currently experiencing depression and anxiety related to health conditions and anxiety related to social situations.   Patient may benefit from CBT to explore unhelpful beliefs about self and re-frame these beliefs to improve mood and decrease anxiety.  Plan: Follow up with behavioral health clinician on: 06/15/23  I discussed the assessment and treatment plan with the patient and/or parent/guardian. They were provided an opportunity to ask questions and all were answered. They agreed with the plan and demonstrated an understanding of the instructions.   They were advised to call back or seek an in-person evaluation if the symptoms worsen or if the condition fails to improve as anticipated.  Sharon Butts, LCSW

## 2023-06-01 NOTE — Research (Signed)
  Subject Name: Sharon Chan  [name] met inclusion and exclusion criteria for the Virtual Care and Social Determinant Interventions for the management of hypertension trial.  The informed consent form, study requirements and expectations were reviewed with the subject by Dr. Duke Salvia and myself. The subject was given the opportunity to read the consent and ask questions. The subject verbalized understanding of the trial requirements.  All questions were addressed prior to the signing of the consent form. The subject agreed to participate in the trial and signed the informed consent. The informed consent was obtained prior to performance of any protocol-specific procedures for the subject.  A copy of the signed informed consent was given to the subject and a copy was placed in the subject's medical record.  Koren R Oka was randomized to Group 1.

## 2023-06-06 ENCOUNTER — Other Ambulatory Visit: Payer: Self-pay | Admitting: Nurse Practitioner

## 2023-06-06 ENCOUNTER — Other Ambulatory Visit (HOSPITAL_COMMUNITY): Payer: Self-pay

## 2023-06-06 ENCOUNTER — Other Ambulatory Visit: Payer: Self-pay

## 2023-06-06 DIAGNOSIS — U071 COVID-19: Secondary | ICD-10-CM

## 2023-06-06 MED ORDER — MOLNUPIRAVIR EUA 200MG CAPSULE
4.0000 | ORAL_CAPSULE | Freq: Two times a day (BID) | ORAL | 0 refills | Status: AC
Start: 2023-06-06 — End: 2023-06-11
  Filled 2023-06-06 – 2023-07-24 (×2): qty 40, 5d supply, fill #0

## 2023-06-07 ENCOUNTER — Other Ambulatory Visit (HOSPITAL_COMMUNITY): Payer: Self-pay

## 2023-06-07 ENCOUNTER — Ambulatory Visit (HOSPITAL_COMMUNITY): Payer: Medicaid Other

## 2023-06-07 ENCOUNTER — Encounter (HOSPITAL_COMMUNITY): Payer: Self-pay

## 2023-06-08 ENCOUNTER — Other Ambulatory Visit (HOSPITAL_COMMUNITY): Payer: Self-pay

## 2023-06-12 ENCOUNTER — Encounter (HOSPITAL_COMMUNITY): Payer: Self-pay

## 2023-06-13 ENCOUNTER — Encounter: Payer: Self-pay | Admitting: Pharmacist

## 2023-06-13 ENCOUNTER — Other Ambulatory Visit: Payer: Self-pay

## 2023-06-14 ENCOUNTER — Ambulatory Visit (HOSPITAL_COMMUNITY)
Admission: RE | Admit: 2023-06-14 | Discharge: 2023-06-14 | Disposition: A | Discharge status: Home / Self Care | Payer: Medicaid Other | Source: Ambulatory Visit | Attending: Cardiovascular Disease | Admitting: Cardiovascular Disease

## 2023-06-14 DIAGNOSIS — R072 Precordial pain: Secondary | ICD-10-CM | POA: Diagnosis not present

## 2023-06-14 DIAGNOSIS — E119 Type 2 diabetes mellitus without complications: Secondary | ICD-10-CM | POA: Diagnosis present

## 2023-06-14 DIAGNOSIS — R002 Palpitations: Secondary | ICD-10-CM | POA: Insufficient documentation

## 2023-06-14 DIAGNOSIS — R079 Chest pain, unspecified: Secondary | ICD-10-CM | POA: Diagnosis present

## 2023-06-14 DIAGNOSIS — E781 Pure hyperglyceridemia: Secondary | ICD-10-CM | POA: Insufficient documentation

## 2023-06-14 MED ORDER — IOHEXOL 350 MG/ML SOLN
95.0000 mL | Freq: Once | INTRAVENOUS | Status: AC | PRN
  Administered 2023-06-14: 95 mL via INTRAVENOUS

## 2023-06-14 MED ORDER — NITROGLYCERIN 0.4 MG SL SUBL
0.8000 mg | SUBLINGUAL_TABLET | Freq: Once | SUBLINGUAL | Status: AC
  Administered 2023-06-14: 0.8 mg via SUBLINGUAL

## 2023-06-14 MED ORDER — NITROGLYCERIN 0.4 MG SL SUBL
SUBLINGUAL_TABLET | SUBLINGUAL | Status: AC
  Filled 2023-06-14: qty 2

## 2023-06-15 ENCOUNTER — Encounter: Payer: Medicaid Other | Admitting: Clinical

## 2023-06-28 ENCOUNTER — Ambulatory Visit: Payer: Medicaid Other | Admitting: Clinical

## 2023-06-28 DIAGNOSIS — F3289 Other specified depressive episodes: Secondary | ICD-10-CM

## 2023-06-28 DIAGNOSIS — F419 Anxiety disorder, unspecified: Secondary | ICD-10-CM

## 2023-06-30 NOTE — BH Specialist Note (Signed)
Integrated Behavioral Health via Telemedicine Visit  06/30/2023 BRALEE ELCOCK [id number]  Number of Integrated Behavioral Health Clinician visits: Additional Visit  Session Start time: 1400   Session End time: 1500  Total time in minutes: 60  Referring Provider: Angus Seller, NP Patient/Family location: [address] [facility] Provider location: [facility] All persons participating in visit: patient, CSW Types of Service: Individual psychotherapy and Video visit  I connected with [name] via Engineer, civil (consulting)  (Video is Surveyor, mining) and verified that I am speaking with the correct person using two identifiers. Discussed confidentiality: Yes   I discussed the limitations of telemedicine and the availability of in person appointments.  Discussed there is a possibility of technology failure and discussed alternative modes of communication if that failure occurs.  I discussed that engaging in this telemedicine visit, they consent to the provision of behavioral healthcare and the services will be billed under their insurance.  Patient and/or legal guardian expressed understanding and consented to Telemedicine visit: Yes   Presenting Concerns: Patient reports the following symptoms/concerns: depression and anxiety related to health concerns Duration of problem: several years; Severity of problem: moderate  Patient and/or Family's Strengths/Protective Factors: Social connections, Social and Emotional competence, Concrete supports in place (healthy food, safe environments, etc.), and Sense of purpose   Goals Addressed: Patient will:  Reduce symptoms of: anxiety and depression   Increase knowledge and/or ability of: coping skills and self-management skills   Demonstrate ability to: Increase healthy adjustment to current life circumstances  Progress towards Goals: Ongoing  Interventions: Interventions utilized:   Supportive Counseling Standardized Assessments completed: Not Needed  Supportive counseling today around challenging family dynamics. Patient continues to help care for her elderly "dad," though he is not her biological father but a very close family friend. His family presents challenging dynamics and patient experiences anxiety and stress around this. Provided emotional validation and supportive reflection. Also reflected on patient's feelings of personal responsibility for dad, as he has made some choices with his family that have had negative consequences. Reflected on patient's responsibility in this situation and re-framed the situation, as dad is responsible for his own choices.  Patient is feeling good about father's progress in recovery; he can do many more things to care for himself now, and she is considering decreasing the amount of time she is there caring for him since he is more independent.   Patient and/or Family Response: Patient engaged in session.   Assessment: Patient currently experiencing depression and anxiety related to health conditions and anxiety related to social situations. She is also experiencing stress around challenging family dynamics.    Patient may benefit from CBT to explore unhelpful beliefs about self and re-frame these beliefs to improve mood and decrease anxiety.  Plan: Follow up with behavioral health clinician on: 07/11/23  I discussed the assessment and treatment plan with the patient and/or parent/guardian. They were provided an opportunity to ask questions and all were answered. They agreed with the plan and demonstrated an understanding of the instructions.   They were advised to call back or seek an in-person evaluation if the symptoms worsen or if the condition fails to improve as anticipated.  Abigail Butts, LCSW

## 2023-07-03 ENCOUNTER — Other Ambulatory Visit: Payer: Self-pay | Admitting: Nurse Practitioner

## 2023-07-03 DIAGNOSIS — I10 Essential (primary) hypertension: Secondary | ICD-10-CM

## 2023-07-04 ENCOUNTER — Ambulatory Visit (HOSPITAL_BASED_OUTPATIENT_CLINIC_OR_DEPARTMENT_OTHER): Payer: Medicaid Other

## 2023-07-04 NOTE — Progress Notes (Deleted)
Office Visit    Patient Name: Sharon Chan Date of Encounter: 07/04/2023  Primary Care Provider:  Ivonne Andrew, NP Primary Cardiologist:  Chilton Si MD  Chief Complaint    Hypertension - Advanced hypertension clinic  Past Medical History   CVA 2018 L thalamic stroke  HLD 5/24 LDL 133 now on atorvastatin 80, evolocumab 140  DM2 4/24 A1c 5.6, on ozempic  Sickle cell trait        Allergies  Allergen Reactions   Penicillins Anaphylaxis   Other Itching, Rash and Other (See Comments)    Strong sunlight - polymorphous light eruption    History of Present Illness    Sharon Chan is a 47 y.o. female patient who was referred to the Advanced Hypertension Clinic by Angus Seller NP.  Prior to seeing Dr. Duke Salvia, patient had been working with Magdalene Molly PharmD in regards to her blood pressure.  They have another scheduled appointment for September.  When she saw Dr. Duke Salvia her pressure in the office was 140/94.  Spironolactone was increased to 25 mg daily and secondary screening was ordered.  She was agreeable to join our RPM research and randomized to group 1.    Today she returns for her first follow up visit.    Blood Pressure Goal:  130/80  Current Medications: amlodipine 10 mg every day, spironolactone 50 mg every day, valsartan hctz 320/12.5 mg qd  Adherence Assessment  Do you ever forget to take your medication? [] Yes [] No  Do you ever skip doses due to side effects? [] Yes [] No  Do you have trouble affording your medicines? [] Yes [] No  Are you ever unable to pick up your medication due to transportation difficulties? [] Yes [] No  Do you ever stop taking your medications because you don't believe they are helping? [] Yes [] No  Do you check your weight daily? [] Yes [] No   Adherence strategy: ***  Barriers to obtaining medications: ***  Previously tried:     Family Hx:     Social Hx:      Tobacco:  Alcohol:  Caffeine:    Diet:       Exercise:   Home BP readings:       Accessory Clinical Findings    Lab Results  Component Value Date   CREATININE 0.94 05/10/2023   BUN 10 05/10/2023   NA 143 05/10/2023   K 3.8 05/10/2023   CL 106 05/10/2023   CO2 26 05/10/2023   Lab Results  Component Value Date   ALT 11 05/10/2023   AST 12 05/10/2023   ALKPHOS 67 05/10/2023   BILITOT 0.3 05/10/2023   Lab Results  Component Value Date   HGBA1C 5.6 02/13/2023    Screening for Secondary Hypertension: { Click here to document screening for secondary causes of HTN  :1}     06/01/2023   10:06 AM  Causes  Drugs/Herbals Screened     - Comments no EtoH, caffeine, tobacco.  Limits sodium.  Renovascular HTN Screened     - Comments Check renal artery Dopplers  Sleep Apnea Screened     - Comments no OSA on sleep study  Thyroid Disease Screened     - Comments check TSH  Hyperaldosteronism Screened     - Comments Check renin and aldo  Pheochromocytoma Screened     - Comments check catecholamines and metanephries  Cushing's Syndrome Screened     - Comments AM cortisol  Hyperparathyroidism Screened  Coarctation of the Aorta Screened     -  Comments BP symmetric  Compliance Screened    Relevant Labs/Studies:    Latest Ref Rng & Units 05/10/2023    9:52 AM 03/30/2023   10:46 AM 03/13/2023   12:07 PM  Basic Labs  Sodium 134 - 144 mmol/L 143  143  142   Potassium 3.5 - 5.2 mmol/L 3.8  3.7  4.1   Creatinine 0.57 - 1.00 mg/dL 2.59  5.63  8.75        Latest Ref Rng & Units 02/03/2020    2:12 PM 12/03/2018    2:16 PM  Thyroid   TSH 0.450 - 4.500 uIU/mL 0.747  0.57                   Home Medications    Current Outpatient Medications  Medication Sig Dispense Refill   albuterol (VENTOLIN HFA) 108 (90 Base) MCG/ACT inhaler Inhale 2 puffs into the lungs every 6 (six) hours as needed for wheezing or shortness of breath. 1 each 11   amLODipine (NORVASC) 10 MG tablet Take 1 tablet (10 mg total) by mouth every  evening. 30 tablet 11   aspirin EC 81 MG tablet Take 1 tablet (81 mg total) by mouth in the morning. 30 tablet 11   atorvastatin (LIPITOR) 80 MG tablet Take 1 tablet (80 mg total) by mouth daily. 90 tablet 3   blood glucose meter kit and supplies KIT Dispense based on patient and insurance preference. Use up to four times daily as directed. (FOR ICD-9 250.00, 250.01). 1 each 0   cyclobenzaprine (FLEXERIL) 5 MG tablet TAKE ONE TABLET BY MOUTH THREE TIMES DAILY AS NEEDED (VIAL) 30 tablet 11   diphenhydrAMINE (BENADRYL) 25 MG tablet Take 25 mg by mouth every 6 (six) hours as needed for allergies.     divalproex (DEPAKOTE ER) 500 MG 24 hr tablet Take 1 tablet (500 mg) by mouth 2 times daily. 180 tablet 3   eszopiclone (LUNESTA) 1 MG TABS tablet Take 1 tablet (1 mg total) by mouth at bedtime as needed for sleep. Take immediately before bedtime 30 tablet 0   Evolocumab (REPATHA SURECLICK) 140 MG/ML SOAJ Inject 140 mg into the skin every 14 days. 6 mL 3   Galcanezumab-gnlm (EMGALITY) 120 MG/ML SOAJ Inject 1 Pen into the skin every 30 (thirty) days. 1 mL 11   metoprolol tartrate (LOPRESSOR) 100 MG tablet TAKE 1 TABLET 2 HOURS PRIOR TO CT 1 tablet 0   nortriptyline (PAMELOR) 25 MG capsule Take 3 capsules (75 mg total) by mouth every evening. 270 capsule 3   OneTouch Delica Lancets 33G MISC TES 2 (TWO) TIMES DAILY. 100 each 5   Semaglutide,0.25 or 0.5MG /DOS, (OZEMPIC, 0.25 OR 0.5 MG/DOSE,) 2 MG/3ML SOPN Inject 0.25 mg weekly for 4 weeks, then increase to 0.5 mg weekly 9 mL 1   spironolactone (ALDACTONE) 50 MG tablet Take 1 tablet (50 mg total) by mouth daily. 90 tablet 3   topiramate (TOPAMAX) 100 MG tablet Take 1 tablet (100 mg) by mouth in the morning AND 2 tablets (200 mg) every evening. 270 tablet 3   valsartan-hydrochlorothiazide (DIOVAN-HCT) 320-12.5 MG tablet Take 1 tablet by mouth daily. 30 tablet 11   No current facility-administered medications for this visit.     Assessment & Plan   No BP  recorded.  {Refresh Note OR Click here to enter BP  :1}***   No problem-specific Assessment & Plan notes found for this encounter.   Phillips Hay PharmD CPP [facility]  [phone]  [employer] [address] [place], [place] [address] [phone]

## 2023-07-07 ENCOUNTER — Other Ambulatory Visit: Payer: Self-pay | Admitting: Neurology

## 2023-07-07 DIAGNOSIS — R519 Headache, unspecified: Secondary | ICD-10-CM

## 2023-07-11 ENCOUNTER — Ambulatory Visit (INDEPENDENT_AMBULATORY_CARE_PROVIDER_SITE_OTHER): Payer: Medicaid Other | Admitting: Clinical

## 2023-07-11 ENCOUNTER — Other Ambulatory Visit: Payer: 59 | Admitting: Pharmacist

## 2023-07-11 ENCOUNTER — Telehealth: Payer: Self-pay

## 2023-07-11 DIAGNOSIS — F3289 Other specified depressive episodes: Secondary | ICD-10-CM

## 2023-07-11 DIAGNOSIS — F419 Anxiety disorder, unspecified: Secondary | ICD-10-CM

## 2023-07-11 NOTE — BH Specialist Note (Unsigned)
Integrated Behavioral Health via Telemedicine Visit  07/13/2023 TARNISHA FARRELL [id number]  Number of Integrated Behavioral Health Clinician visits: Additional Visit  Session Start time: 1515   Session End time: 1600  Total time in minutes: 45  Referring Provider: Angus Seller, NP Patient/Family location: [address] [facility] Provider location: [facility] All persons participating in visit: patient, CSW Types of Service: Individual psychotherapy and Video visit  I connected with [name] via Engineer, civil (consulting)  (Video is Surveyor, mining) and verified that I am speaking with the correct person using two identifiers. Discussed confidentiality: Yes   I discussed the limitations of telemedicine and the availability of in person appointments.  Discussed there is a possibility of technology failure and discussed alternative modes of communication if that failure occurs.  I discussed that engaging in this telemedicine visit, they consent to the provision of behavioral healthcare and the services will be billed under their insurance.  Patient and/or legal guardian expressed understanding and consented to Telemedicine visit: Yes   Presenting Concerns: Patient reports the following symptoms/concerns: depression and anxiety related to health concerns Duration of problem: several years; Severity of problem: moderate  Patient and/or Family's Strengths/Protective Factors: Social connections, Social and Emotional competence, Concrete supports in place (healthy food, safe environments, etc.), and Sense of purpose   Goals Addressed: Patient will:  Reduce symptoms of: anxiety and depression   Increase knowledge and/or ability of: coping skills and self-management skills   Demonstrate ability to: Increase healthy adjustment to current life circumstances  Progress towards Goals: Ongoing  Interventions: Interventions utilized:   Supportive Counseling Standardized Assessments completed: Not Needed  Patient continues to deal with significant forgetfulness and cognitive issues. She reports she's had some small seizures. She has a history of stroke. She is followed by neurology. Provided supportive counseling around this, as patient expressed concerns about her health. Emotional validation and reflective listening provided.  Patient is considering picking crocheting back up and is also considering taking a dance class. She would like to keep her mind active. Encouraged these activities and also discussed alternating bilateral movements, which can be soothing for anxiety and also improve neuro-plasticity.   Supportive counseling also provided around challenging family dynamics.   Patient and/or Family Response: Patient engaged in session.  Assessment: Patient currently experiencing depression and anxiety related to health conditions and anxiety related to social situations. She is also experiencing stress around challenging family dynamics.   Patient may benefit from CBT to explore unhelpful beliefs about self and re-frame these beliefs to improve mood and decrease anxiety.   Plan: Follow up with behavioral health clinician on: 07/25/23  I discussed the assessment and treatment plan with the patient and/or parent/guardian. They were provided an opportunity to ask questions and all were answered. They agreed with the plan and demonstrated an understanding of the instructions.   They were advised to call back or seek an in-person evaluation if the symptoms worsen or if the condition fails to improve as anticipated.  Abigail Butts, LCSW

## 2023-07-11 NOTE — Telephone Encounter (Signed)
Emgality needs a PA. She is already on a beta blocker (metoprolol), nortriptyline, Topamax with continued migraines.

## 2023-07-12 ENCOUNTER — Other Ambulatory Visit (HOSPITAL_COMMUNITY): Payer: Self-pay

## 2023-07-12 NOTE — Telephone Encounter (Signed)
Does patient have primary insurance? The Medicare card on file shows coverage terminated on 05-14-2023 and her Medicaid is stating to bill to primary payor first. If patient does not have primary, she will need to contact her case worker to have her previous insurance removed.

## 2023-07-22 ENCOUNTER — Other Ambulatory Visit: Payer: Self-pay

## 2023-07-24 ENCOUNTER — Other Ambulatory Visit: Payer: Self-pay

## 2023-07-24 ENCOUNTER — Other Ambulatory Visit (HOSPITAL_COMMUNITY): Payer: Self-pay

## 2023-07-24 ENCOUNTER — Encounter: Payer: Self-pay | Admitting: Pharmacist

## 2023-07-24 ENCOUNTER — Other Ambulatory Visit (HOSPITAL_BASED_OUTPATIENT_CLINIC_OR_DEPARTMENT_OTHER): Payer: Self-pay | Admitting: Cardiovascular Disease

## 2023-07-25 ENCOUNTER — Ambulatory Visit (INDEPENDENT_AMBULATORY_CARE_PROVIDER_SITE_OTHER): Payer: Medicaid Other | Admitting: Clinical

## 2023-07-25 DIAGNOSIS — F3289 Other specified depressive episodes: Secondary | ICD-10-CM

## 2023-07-25 DIAGNOSIS — F419 Anxiety disorder, unspecified: Secondary | ICD-10-CM | POA: Diagnosis not present

## 2023-07-25 DIAGNOSIS — F32A Depression, unspecified: Secondary | ICD-10-CM

## 2023-07-26 NOTE — BH Specialist Note (Signed)
Integrated Behavioral Health via Telemedicine Visit  07/26/2023 Sharon Chan [id number]  Number of Integrated Behavioral Health Clinician visits: Additional Visit  Session Start time: 1400   Session End time: 1500  Total time in minutes: 60  Referring Provider: Angus Seller, NP Patient/Family location: [address] [facility] Provider location: [facility] All persons participating in visit: patient, CSW Types of Service: Individual psychotherapy and Video visit  I connected with Sharon Chan via Engineer, civil (consulting)  (Video is Surveyor, mining) and verified that I am speaking with the correct person using two identifiers. Discussed confidentiality: Yes   I discussed the limitations of telemedicine and the availability of in person appointments.  Discussed there is a possibility of technology failure and discussed alternative modes of communication if that failure occurs.  I discussed that engaging in this telemedicine visit, they consent to the provision of behavioral healthcare and the services will be billed under their insurance.  Patient and/or legal guardian expressed understanding and consented to Telemedicine visit: Yes   Presenting Concerns: Patient reports the following symptoms/concerns: depression and anxiety related to health concerns Duration of problem: several years; Severity of problem: moderate  Patient and/or Family's Strengths/Protective Factors: Social connections, Social and Emotional competence, Concrete supports in place (healthy food, safe environments, etc.), and Sense of purpose   Goals Addressed: Patient will:  Reduce symptoms of: anxiety and depression   Increase knowledge and/or ability of: coping skills and self-management skills   Demonstrate ability to: Increase healthy adjustment to current life circumstances  Progress towards Goals: Ongoing  Interventions: Interventions utilized:   Supportive Counseling Standardized Assessments completed: Not Needed  Supportive counseling today around patient's ongoing issues with her health. Provided emotional validation and reflective listening. Patient continues to have a positive outlook and uses humor to cope with the continuing changes with her health.   Patient and/or Family Response: Patient engaged in session.   Assessment: Patient currently experiencing depression and anxiety related to health conditions and anxiety related to social situations. She is also experiencing stress around challenging family dynamics.    Patient may benefit from CBT to explore unhelpful beliefs about self and re-frame these beliefs to improve mood and decrease anxiety.   Plan: Follow up with behavioral health clinician on: will call to schedule  I discussed the assessment and treatment plan with the patient and/or parent/guardian. They were provided an opportunity to ask questions and all were answered. They agreed with the plan and demonstrated an understanding of the instructions.   They were advised to call back or seek an in-person evaluation if the symptoms worsen or if the condition fails to improve as anticipated.  Abigail Butts, LCSW

## 2023-07-27 ENCOUNTER — Other Ambulatory Visit: Payer: Self-pay

## 2023-07-27 ENCOUNTER — Encounter (HOSPITAL_BASED_OUTPATIENT_CLINIC_OR_DEPARTMENT_OTHER): Payer: Self-pay | Admitting: Pharmacist Clinician (PhC)/ Clinical Pharmacy Specialist

## 2023-07-27 ENCOUNTER — Other Ambulatory Visit (HOSPITAL_COMMUNITY): Payer: Self-pay

## 2023-07-27 ENCOUNTER — Ambulatory Visit (INDEPENDENT_AMBULATORY_CARE_PROVIDER_SITE_OTHER): Payer: Medicaid Other | Admitting: Pharmacist Clinician (PhC)/ Clinical Pharmacy Specialist

## 2023-07-27 VITALS — BP 143/90 | HR 58 | Wt 213.4 lb

## 2023-07-27 DIAGNOSIS — I10 Essential (primary) hypertension: Secondary | ICD-10-CM

## 2023-07-27 MED ORDER — VALSARTAN-HYDROCHLOROTHIAZIDE 320-25 MG PO TABS
1.0000 | ORAL_TABLET | Freq: Every day | ORAL | 3 refills | Status: DC
  Filled 2023-07-27: qty 30, 30d supply, fill #0
  Filled 2023-07-29: qty 90, 90d supply, fill #0
  Filled 2023-08-02: qty 30, 30d supply, fill #0
  Filled 2023-08-28: qty 30, 30d supply, fill #1

## 2023-07-27 NOTE — Patient Instructions (Signed)
Follow up appointment: Friday October 11 at 8:30 am at the [facility]  Take your BP meds as follows:  Increase valsartan hctz to 320/25 mg once daily in the mornings  Move amlodipine and spironolactone to nights  Check your blood pressure at home daily (if able) and keep record of the readings.  Hypertension "High blood pressure"  Hypertension is often called "The Silent Killer." It rarely causes symptoms until it is extremely  high or has done damage to other organs in the body. For this reason, you should have your  blood pressure checked regularly by your physician. We will check your blood pressure  every time you see a provider at one of our offices.   Your blood pressure reading consists of two numbers. Ideally, blood pressure should be  below 120/80. The first ("top") number is called the systolic pressure. It measures the  pressure in your arteries as your heart beats. The second ("bottom") number is called the diastolic pressure. It measures the pressure in your arteries as the heart relaxes between beats.  The benefits of getting your blood pressure under control are enormous. A 10-point  reduction in systolic blood pressure can reduce your risk of stroke by 27% and heart failure by 28%  Your blood pressure goal is <130/80  To check your pressure at home you will need to:  1. Sit up in a chair, with feet flat on the floor and back supported. Do not cross your ankles or legs. 2. Rest your left arm so that the cuff is about heart level. If the cuff goes on your upper arm,  then just relax the arm on the table, arm of the chair or your lap. If you have a wrist cuff, we  suggest relaxing your wrist against your chest (think of it as Pledging the Flag with the  wrong arm).  3. Place the cuff snugly around your arm, about 1 inch above the crook of your elbow. The  cords should be inside the groove of your elbow.  4. Sit quietly, with the cuff in place, for about 5  minutes. After that 5 minutes press the power  button to start a reading. 5. Do not talk or move while the reading is taking place.  6. Record your readings on a sheet of paper. Although most cuffs have a memory, it is often  easier to see a pattern developing when the numbers are all in front of you.  7. You can repeat the reading after 1-3 minutes if it is recommended  Make sure your bladder is empty and you have not had caffeine or tobacco within the last 30 min  Always bring your blood pressure log with you to your appointments. If you have not brought your monitor in to be double checked for accuracy, please bring it to your next appointment.  You can find a list of quality blood pressure cuffs at validatebp.org

## 2023-07-27 NOTE — Progress Notes (Signed)
Office Visit    Patient Name: Sharon Chan Date of Encounter: 07/27/2023  Primary Care Provider:  Ivonne Andrew, NP Primary Cardiologist:  Chilton Si MD  Chief Complaint    Hypertension - Advanced hypertension clinic  Past Medical History   CVA 2018 L thalamic stroke  HLD 5/24 LDL 133 now on atorvastatin 80, evolocumab 140  DM2 4/24 A1c 5.6, on ozempic  Sickle cell trait     Allergies  Allergen Reactions   Penicillins Anaphylaxis   Lisinopril Cough   Other Itching, Rash and Other (See Comments)    Strong sunlight - polymorphous light eruption    History of Present Illness    Sharon Chan is a 47 y.o. female patient who was referred to the Advanced Hypertension Clinic by Angus Seller NP.  Prior to seeing Dr. Duke Salvia, patient had been working with Magdalene Molly PharmD in regards to her blood pressure.  They have another scheduled appointment for September.  When she saw Dr. Duke Salvia her pressure in the office was 140/94.  Spironolactone was increased to 25 mg daily and secondary screening was ordered.  She was agreeable to join our RPM research and randomized to group 1.    Today she returns for her first follow up visit.  She has been doing well with her current medications and has no concerns today.  She has not been able to get to the lab for her secondary screening labs, but we can have her do that this morning.   Blood Pressure Goal:  130/80  Current Medications: amlodipine 10 mg every day am, spironolactone 50 mg every day am, valsartan hctz 320/12.5 mg every day - late afternoon  Family Hx:  mother living (hypertension); father died 53 (suicide), sister healthy, brother deceased   Social Hx:      Tobacco: no  Alcohol: no  Caffeine: no  Diet: mostly home cooked foods, no added salt, chicken , fish and [place], vegetables fresh; doesn't snack much   Exercise: walks twice daily about 1 mile, each time, 2 miles on treadmill daily; resistance  band; lifting.caring for 160 lb man with parkinsons  Home BP readings:  has been checking twice daily with [employer] 1 cuff 30 AM readings - 130/81  HR 72  (range 94-162/63-104) 30 PM readings -  140/81 HR 78  (range 113-190/67-105)   Accessory Clinical Findings    Lab Results  Component Value Date   CREATININE 0.94 05/10/2023   BUN 10 05/10/2023   NA 143 05/10/2023   K 3.8 05/10/2023   CL 106 05/10/2023   CO2 26 05/10/2023   Lab Results  Component Value Date   ALT 11 05/10/2023   AST 12 05/10/2023   ALKPHOS 67 05/10/2023   BILITOT 0.3 05/10/2023   Lab Results  Component Value Date   HGBA1C 5.6 02/13/2023    Screening for Secondary Hypertension:      06/01/2023   10:06 AM  Causes  Drugs/Herbals Screened     - Comments no EtoH, caffeine, tobacco.  Limits sodium.  Renovascular HTN Screened     - Comments Check renal artery Dopplers  Sleep Apnea Screened     - Comments no OSA on sleep study  Thyroid Disease Screened     - Comments check TSH  Hyperaldosteronism Screened     - Comments Check renin and aldo  Pheochromocytoma Screened     - Comments check catecholamines and metanephries  Cushing's Syndrome Screened     -  Comments AM cortisol  Hyperparathyroidism Screened  Coarctation of the Aorta Screened     - Comments BP symmetric  Compliance Screened    Relevant Labs/Studies:    Latest Ref Rng & Units 05/10/2023    9:52 AM 03/30/2023   10:46 AM 03/13/2023   12:07 PM  Basic Labs  Sodium 134 - 144 mmol/L 143  143  142   Potassium 3.5 - 5.2 mmol/L 3.8  3.7  4.1   Creatinine 0.57 - 1.00 mg/dL 1.61  0.96  0.45        Latest Ref Rng & Units 02/03/2020    2:12 PM 12/03/2018    2:16 PM  Thyroid   TSH 0.450 - 4.500 uIU/mL 0.747  0.57                 Home Medications    Current Outpatient Medications  Medication Sig Dispense Refill   valsartan-hydrochlorothiazide (DIOVAN-HCT) 320-25 MG tablet Take 1 tablet by mouth daily. 90 tablet 3   albuterol (VENTOLIN  HFA) 108 (90 Base) MCG/ACT inhaler Inhale 2 puffs into the lungs every 6 (six) hours as needed for wheezing or shortness of breath. 1 each 11   amLODipine (NORVASC) 10 MG tablet Take 1 tablet (10 mg total) by mouth every evening. 30 tablet 11   aspirin EC 81 MG tablet Take 1 tablet (81 mg total) by mouth in the morning. 30 tablet 11   atorvastatin (LIPITOR) 80 MG tablet Take 1 tablet (80 mg total) by mouth daily. 90 tablet 3   blood glucose meter kit and supplies KIT Dispense based on patient and insurance preference. Use up to four times daily as directed. (FOR ICD-9 250.00, 250.01). 1 each 0   cyclobenzaprine (FLEXERIL) 5 MG tablet TAKE ONE TABLET BY MOUTH THREE TIMES DAILY AS NEEDED (VIAL) 30 tablet 11   diphenhydrAMINE (BENADRYL) 25 MG tablet Take 25 mg by mouth every 6 (six) hours as needed for allergies.     divalproex (DEPAKOTE ER) 500 MG 24 hr tablet TAKE ONE TABLET BY MOUTH DAILY AT 9PM NIGHTLY 90 tablet 1   eszopiclone (LUNESTA) 1 MG TABS tablet Take 1 tablet (1 mg total) by mouth at bedtime as needed for sleep. Take immediately before bedtime 30 tablet 0   Evolocumab (REPATHA SURECLICK) 140 MG/ML SOAJ Inject 140 mg into the skin every 14 days. 6 mL 3   Galcanezumab-gnlm (EMGALITY) 120 MG/ML SOAJ Inject 1 Pen into the skin every 30 (thirty) days. 1 mL 11   nortriptyline (PAMELOR) 25 MG capsule TAKE TWO CAPSULES BY MOUTH DAILY AT 9PM NIGHTLY 180 capsule 6   OneTouch Delica Lancets 33G MISC TES 2 (TWO) TIMES DAILY. 100 each 5   Semaglutide,0.25 or 0.5MG /DOS, (OZEMPIC, 0.25 OR 0.5 MG/DOSE,) 2 MG/3ML SOPN INJECT 0.25 MG WEEKLY FOR 4 WEEKS, THEN INCREASE TO 0.5 MG WEEKLY 3 mL 1   spironolactone (ALDACTONE) 50 MG tablet Take 1 tablet (50 mg total) by mouth daily. 90 tablet 3   topiramate (TOPAMAX) 100 MG tablet Take 1 tablet (100 mg total) by mouth in the morning AND 2 tablets (200 mg total) every evening. 270 tablet 3   No current facility-administered medications for this visit.      Assessment & Plan   HYPERTENSION CONTROL Vitals:   07/27/23 0822 07/27/23 0826  BP: (!) 152/98 (!) 143/90    The patient's blood pressure is elevated above target today.  In order to address the patient's elevated BP: A current anti-hypertensive medication was adjusted  today.; Blood pressure will be monitored at home to determine if medication changes need to be made.      Benign essential HTN Assessment: BP is uncontrolled in office BP 143/90 mmHg;  above the goal (<130/80). Tolerates current medications  well without any side effects Denies SOB, palpitation, chest pain, headaches,or swelling Reiterated the importance of regular exercise and low salt diet   Plan:  Increase valsartan hctz to 320/25 mg every day, in the mornings Continue taking amlodipine and spironolactone, but take both at night Patient to keep record of BP readings with heart rate and report to [name] at the next visit Patient to follow up with PharmD in 1 month  Labs ordered today: none, will get labs previously ordered this morning - catecholamines, metanephrines, cortisol, BMET    Phillips Hay PharmD CPP [facility]  [address] [place], [place] [address] [phone]

## 2023-07-27 NOTE — Assessment & Plan Note (Signed)
Assessment: BP is uncontrolled in office BP 143/90 mmHg;  above the goal (<130/80). Tolerates current medications  well without any side effects Denies SOB, palpitation, chest pain, headaches,or swelling Reiterated the importance of regular exercise and low salt diet   Plan:  Increase valsartan hctz to 320/25 mg every day, in the mornings Continue taking amlodipine and spironolactone, but take both at night Patient to keep record of BP readings with heart rate and report to [name] at the next visit Patient to follow up with PharmD in 1 month  Labs ordered today: none, will get labs previously ordered this morning - catecholamines, metanephrines, cortisol, BMET

## 2023-07-28 ENCOUNTER — Other Ambulatory Visit: Payer: Self-pay

## 2023-07-28 ENCOUNTER — Telehealth: Payer: Self-pay | Admitting: Neurology

## 2023-07-28 ENCOUNTER — Other Ambulatory Visit (HOSPITAL_COMMUNITY): Payer: Self-pay

## 2023-07-28 DIAGNOSIS — R519 Headache, unspecified: Secondary | ICD-10-CM

## 2023-07-28 MED ORDER — DIVALPROEX SODIUM ER 500 MG PO TB24
500.0000 mg | ORAL_TABLET | Freq: Every day | ORAL | 1 refills | Status: AC
Start: 2023-07-28 — End: ?
  Filled 2023-07-28: qty 90, 90d supply, fill #0
  Filled 2023-08-02: qty 30, 30d supply, fill #0
  Filled 2023-08-28: qty 30, 30d supply, fill #1

## 2023-07-28 MED ORDER — NORTRIPTYLINE HCL 25 MG PO CAPS
75.0000 mg | ORAL_CAPSULE | Freq: Every day | ORAL | 1 refills | Status: AC
Start: 2023-07-28 — End: ?
  Filled 2023-07-28: qty 180, fill #0
  Filled 2023-08-02: qty 90, 30d supply, fill #0
  Filled 2023-08-28: qty 90, 30d supply, fill #1

## 2023-07-28 NOTE — Telephone Encounter (Signed)
Pharm[facility] is calling for a refill.  Divalproex sodium er 500mg  and nortriptyline 25mg    Number is [phone]

## 2023-07-29 ENCOUNTER — Other Ambulatory Visit (HOSPITAL_COMMUNITY): Payer: Self-pay

## 2023-07-31 ENCOUNTER — Other Ambulatory Visit: Payer: Self-pay

## 2023-08-01 ENCOUNTER — Other Ambulatory Visit (HOSPITAL_COMMUNITY): Payer: Self-pay

## 2023-08-01 ENCOUNTER — Other Ambulatory Visit: Payer: 59 | Admitting: Pharmacist

## 2023-08-01 NOTE — Progress Notes (Unsigned)
Care Coordination Call  Contacted patient for scheduled appointment, but medication access concerns persist. On dispensing side, no active insurance is visible.   Patient notes she has been without Ozempic, Repatha, Emgality lately. Has had oral medications, but has not gotten new dose of valsartan/hydrochlorothiazide. Patient has Medicaid Direct, but is awaiting a new Medicare plan while being re-certified for disability.   Patient will contact Medicaid to let them know she does not currently have a Medicare plan. Will investigate cash price of her medications.   Catie Eppie Gibson, PharmD, BCACP, CPP Clinical Pharmacist [facility] [phone]

## 2023-08-01 NOTE — Progress Notes (Deleted)
08/01/2023 Name: Sharon Chan MRN: [id number] DOB: 1976/11/01  No chief complaint on file.   Sharon Chan is a 47 y.o. year old female who presented for a telephone visit.   They were referred to the pharmacist by their PCP for assistance in managing hypertension   Subjective:  Care Team: Primary Care Provider: Ivonne Andrew, NP ; Next Scheduled Visit: 08/16/2023 Cardiology CPP - Phillips Hay; Next Scheduled Visit: 08/25/2023  Medication Access/Adherence  Current Pharmacy:  [facility] [address] [place] [address] Phone: [phone] Fax: [phone]  [id number] - [place], [place] - [address] AT [facility] [address] [place] [address] Phone: [phone] Fax: [phone]  [facility] - [place], [place] - [address] [place] [address] Phone: [phone] Fax: [phone]   Patient reports affordability concerns with their medications: {YES/NO:21197} Patient reports access/transportation concerns to their pharmacy: {YES/NO:21197} Patient reports adherence concerns with their medications:  {YES/NO:21197} ***   Diabetes:  Current medications: Ozempic 0.5 mg weekly Medications tried in the past:   Current glucose readings: *** Using *** meter; testing *** times daily  Date of Download: *** % Time CGM is active: ***% Average Glucose: *** mg/dL Glucose Management Indicator: ***  Glucose Variability: *** (goal <36%) Time in Goal:  - Time in range 70-180: ***% - Time above range: ***% - Time below range: ***% Observed patterns:  Patient {Actions; denies-reports:120008} hypoglycemic s/sx including ***dizziness, shakiness, sweating. Patient {Actions; denies-reports:120008} hyperglycemic symptoms including ***polyuria, polydipsia, polyphagia, nocturia, neuropathy, blurred vision.  Current meal patterns:  -  Breakfast: *** - Lunch *** - Supper *** - Snacks *** - Drinks ***  Current physical activity: ***  Current medication access support: ***   Hypertension:  Current medications: valsartan/hydrochlorothiazide 320-25 mg daily, amlodipine 10 mg daily in the evening, spironolactone 50 mg daily in the evening Medications previously tried:   Patient {HAS/DOES NOT UKGU:54270} a validated, automated, upper arm home BP cuff Current blood pressure readings readings: ***  Patient {Actions; denies-reports:120008} hypotensive s/sx including ***dizziness, lightheadedness.  Patient {Actions; denies-reports:120008} hypertensive symptoms including ***headache, chest pain, shortness of breath  Current meal patterns: ***  Current physical activity: ***  Hyperlipidemia/ASCVD Risk Reduction  Current lipid lowering medications: atorvastatin 80 mg daily, Repatha? Medications tried in the past:   Antiplatelet regimen:   ASCVD History: hx of stroke Risk Factors: HTN, T2DM  Current physical activity: ***  Current medication access support: ***   Objective:  Lab Results  Component Value Date   HGBA1C 5.6 02/13/2023    Lab Results  Component Value Date   CREATININE 0.94 05/10/2023   BUN 10 05/10/2023   NA 143 05/10/2023   K 3.8 05/10/2023   CL 106 05/10/2023   CO2 26 05/10/2023    Lab Results  Component Value Date   CHOL 191 03/30/2023   HDL 46 03/30/2023   LDLCALC 133 (H) 03/30/2023   LDLDIRECT 122 (H) 03/30/2023   TRIG 65 03/30/2023   CHOLHDL 4.2 03/30/2023    Medications Reviewed Today   Medications were not reviewed in this encounter       Assessment/Plan:   {Pharmacy A/P Choices:26421}  Follow Up Plan: ***  ***

## 2023-08-02 ENCOUNTER — Other Ambulatory Visit (HOSPITAL_COMMUNITY): Payer: Self-pay

## 2023-08-02 NOTE — Progress Notes (Signed)
Care Coordination Call  Spoke with patient and pharmacy. Copays for oral medications will be placed on a charge account for her to pay as able. Medications will be shipped out tomorrow.   Patient will contact Medicaid to notify that she no longer has Medicare.   Catie Eppie Gibson, PharmD, BCACP, CPP Clinical Pharmacist [facility] [phone]

## 2023-08-03 ENCOUNTER — Other Ambulatory Visit: Payer: Self-pay

## 2023-08-03 ENCOUNTER — Telehealth: Payer: Self-pay | Admitting: *Deleted

## 2023-08-03 DIAGNOSIS — K869 Disease of pancreas, unspecified: Secondary | ICD-10-CM

## 2023-08-03 DIAGNOSIS — K862 Cyst of pancreas: Secondary | ICD-10-CM

## 2023-08-03 NOTE — Telephone Encounter (Signed)
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  I have left a message for patient to call back.

## 2023-08-03 NOTE — Telephone Encounter (Signed)
-----   Message from Nurse Avalon P sent at 05/03/2023  9:27 AM EDT ----- Regarding: 86-month MRCP MRI/MRCP abdomen due - pancreatic cyst/lesion - need to order

## 2023-08-04 ENCOUNTER — Other Ambulatory Visit: Payer: Self-pay

## 2023-08-04 NOTE — Telephone Encounter (Signed)
Patient advised to follow recommendations as given earlier today.

## 2023-08-04 NOTE — Telephone Encounter (Signed)
August 04, 2023 [place], [place]   08/04/23  1:34 PM Sounds good for now. Thanks-JLL

## 2023-08-04 NOTE — Telephone Encounter (Signed)
Patient is advised of MRCP scheduled for 08/10/23. Unfortunately, she already has appointment at this time; MRCP rescheduled to [facility], Saturday, 08/12/23 at 9 am, 830 am arrival, NPO 4 hours. Patient will need to go through ER entrance to register. Patient verbalizes understanding of time/date/location/prep for exam dated 08/12/23.  In addition, patient states that she has been having more frequent episodes of intermittent RLQ abdominal pain/pressure with radiation into the back. She does have nausea with these episodes but no vomiting. Also notes that when she experiences pain, she is unable to have bowel movements and she is seeking advise on medication to help with this discomfort. I have advised her to follow a high fiber diet, increase water intake to 64 ounces or more daily, add benefiber 1 teaspoon daily and have advised that if these measures are unhelpful, she may start miralax 1 capful OTC daily with titration as needed based on bowel movements.  Please advise on any further thoughts/recommendations.

## 2023-08-04 NOTE — Telephone Encounter (Signed)
Left message for patient to call back  

## 2023-08-07 ENCOUNTER — Other Ambulatory Visit: Payer: Self-pay | Admitting: Nurse Practitioner

## 2023-08-09 ENCOUNTER — Ambulatory Visit: Payer: Medicaid Other | Admitting: Clinical

## 2023-08-09 DIAGNOSIS — F419 Anxiety disorder, unspecified: Secondary | ICD-10-CM

## 2023-08-09 DIAGNOSIS — F3289 Other specified depressive episodes: Secondary | ICD-10-CM | POA: Diagnosis not present

## 2023-08-09 NOTE — BH Specialist Note (Unsigned)
Sept 28  21 years ago dad died   Continued issues with dad's sister and son Meeting with attorney today - POA

## 2023-08-10 ENCOUNTER — Other Ambulatory Visit: Payer: Medicaid Other | Admitting: Pharmacist

## 2023-08-10 ENCOUNTER — Other Ambulatory Visit: Payer: Self-pay

## 2023-08-10 ENCOUNTER — Telehealth: Payer: Self-pay | Admitting: Pharmacist

## 2023-08-10 ENCOUNTER — Ambulatory Visit (HOSPITAL_COMMUNITY): Payer: Medicaid Other

## 2023-08-10 NOTE — Progress Notes (Signed)
08/10/2023 Name: Sharon Chan MRN: [id number] DOB: 1976/04/25  No chief complaint on file.   Sharon Chan is a 47 y.o. year old female who presented for a telephone visit.   They were referred to the pharmacist by their PCP for assistance in managing hypertension and hyperlipidemia.    Subjective:  Patient reports not feeling well today. Has had increased BP over the last few days with increased lethargy and some lightheadedness/dizziness.   Care Team: Primary Care Provider: Ivonne Andrew, NP ; Next Scheduled Visit: 08/21/23 Cardiology Pharmacist: Phillips Hay, PharmD; Next Scheduled Visit: 08/25/23  Medication Access/Adherence  Current Pharmacy:  [facility] [address] [place] [address] Phone: [phone] Fax: [phone]  [id number] [place], [place] - [address] [facility] [address] [place] [address] Phone: [phone] Fax: [phone]  [facility] - [place], [place] - [address] [place] [address] Phone: [phone] Fax: [phone]   Patient reports affordability concerns with their medications: No  - Reports no update from case manager regarding Medicare status. She was able to get most maintenance medications from [facility] mail order last Friday. Patient reports access/transportation concerns to their pharmacy: No  Patient reports adherence concerns with their medications:  No  Patient reports improved adherence. Has a reminder set on her phone to take medications and denies missed doses in the past week.   Diabetes:  Current medications: none - has been off of Ozempic d/t cost and insurance coverage issues   Hypertension:  Current medications: amlodipine 10 mg daily (9 AM), spironolactone 50 mg daily (9 AM), valsartan/hydrochlorothiazide 320-25 mg daily (12 noon)  Patient has a validated, automated,  upper arm home BP cuff. Current blood pressure readings readings: BP has been ~180/101 for the last few days, before that readings were ~140/80  Patient reports hypotensive s/sx including dizziness, lightheadedness when going from sitting to standing or with quick head movements that is new in the past few days. Patient denies hypertensive symptoms including headache, chest pain, shortness of breath. Denies stroke symptoms including one sided weakness or difficulty with speech. Reports she has been lethargic the past few days since BP has been increased.  Does note that her BP is usually higher around her menstrual period, but reports she is not currently on her period. She believes she is beginning menopause and has about 2-3 hot flashes per day. She denies any changes that could be contributing to increased BP including changes in stress, new medications, or caffeine intake.   Hyperlipidemia/ASCVD Risk Reduction  Current lipid lowering medications: atorvastatin 80 mg daily  Antiplatelet regimen: aspirin 81 mg daily  ASCVD History: hx of CVA Risk Factors: HTN    Objective:  Lab Results  Component Value Date   HGBA1C 5.6 02/13/2023    Lab Results  Component Value Date   CREATININE 0.94 05/10/2023   BUN 10 05/10/2023   NA 143 05/10/2023   K 3.8 05/10/2023   CL 106 05/10/2023   CO2 26 05/10/2023    Lab Results  Component Value Date   CHOL 191 03/30/2023   HDL 46 03/30/2023   LDLCALC 133 (H) 03/30/2023   LDLDIRECT 122 (H) 03/30/2023   TRIG 65 03/30/2023   CHOLHDL 4.2 03/30/2023    Medications Reviewed Today   Medications were not reviewed in this encounter       Assessment/Plan:   Diabetes: -  Currently controlled  - Reviewed long term cardiovascular and renal outcomes of uncontrolled blood sugar - Reviewed goal A1c, goal fasting, and goal 2 hour post prandial glucose - Recommend to restart Ozempic once insurance issue is resolved    Hypertension: -  Currently uncontrolled based on patient reported home BP readings. - Recommended patient be evaluated in the emergency department given significantly increased home BP readings over the past few days with associated symptoms of increased lethargy and lightheadedness/dizziness and history of stroke. She expressed understanding and stated she plans to go to emergency room to be evaluated. She noted that these symptoms are different than how she has felt before with higher BP and agreed to be evaluated emergently. - Reviewed long term cardiovascular and renal outcomes of uncontrolled blood pressure - Reviewed appropriate blood pressure monitoring technique and reviewed goal blood pressure. Recommended to check home blood pressure and heart rate  - Recommend to continue valsartan/hydrochlorothiazide 320-25 mg daily, amlodipine 10 mg daily, and spironolactone 50 mg daily. Would recommend initiation of hydralazine for additional BP control.     Hyperlipidemia/ASCVD Risk Reduction - Currently uncontrolled based on last LDL 122, above goal <55 mg/dl given hx of stroke with risk factors T2DM and HTN - Reviewed long term complications of uncontrolled cholesterol - Recommend to continue atorvastatin 80 mg daily and start Repatha once insurance issues have resolved.    Follow Up Plan: pharmacist to follow up in 1 week  Jarrett Ables, PharmD PGY-1 Pharmacy Resident

## 2023-08-10 NOTE — Telephone Encounter (Signed)
Was notified by one of the pharmacy resident Jarrett Ables, [facility].   "I just spoke with this patient who you have been following with for hypertension on the phone. Prior to the last few days she said her BP was 140s/80, but in the last few days BP has been high ~180s/100 and she has new symptoms including lightheadedness/dizziness with quick head movements and when going from sitting to standing. She also has new increased lethargy over the past few days. Denies chest pain/SOB. Wanted to reach out to you for advice on how to proceed, see if you think she needs to go be evaluated emergently? Let me know your thoughts, thank you!"  Called patient, N/A, LVM.  Looks like patient is struggling with cost of the medications - sorting out medicaid/ medicare coverage.  Patient is already on ARB-thiazide, CCB, and MRA. Labs to rule out secondary hypertension has been ordered. Labs are still not done by patient. If BP remains above goal can add low dose hydralazine to current BP meds regimen

## 2023-08-12 ENCOUNTER — Ambulatory Visit (HOSPITAL_COMMUNITY)
Admission: RE | Admit: 2023-08-12 | Discharge: 2023-08-12 | Disposition: A | Discharge status: Home / Self Care | Payer: Medicaid Other | Source: Ambulatory Visit | Attending: Physician Assistant | Admitting: Physician Assistant

## 2023-08-12 ENCOUNTER — Other Ambulatory Visit (HOSPITAL_COMMUNITY): Payer: Self-pay | Admitting: Physician Assistant

## 2023-08-12 DIAGNOSIS — R52 Pain, unspecified: Secondary | ICD-10-CM

## 2023-08-12 DIAGNOSIS — K869 Disease of pancreas, unspecified: Secondary | ICD-10-CM | POA: Diagnosis not present

## 2023-08-12 DIAGNOSIS — K862 Cyst of pancreas: Secondary | ICD-10-CM | POA: Insufficient documentation

## 2023-08-12 DIAGNOSIS — R109 Unspecified abdominal pain: Secondary | ICD-10-CM | POA: Diagnosis not present

## 2023-08-12 MED ORDER — GADOBUTROL 1 MMOL/ML IV SOLN
10.0000 mL | Freq: Once | INTRAVENOUS | Status: AC | PRN
  Administered 2023-08-12: 10 mL via INTRAVENOUS

## 2023-08-15 ENCOUNTER — Other Ambulatory Visit (HOSPITAL_COMMUNITY): Payer: Self-pay

## 2023-08-16 ENCOUNTER — Ambulatory Visit: Payer: Self-pay | Admitting: Nurse Practitioner

## 2023-08-17 DIAGNOSIS — R1084 Generalized abdominal pain: Secondary | ICD-10-CM | POA: Diagnosis not present

## 2023-08-21 ENCOUNTER — Encounter: Payer: Self-pay | Admitting: Nurse Practitioner

## 2023-08-21 ENCOUNTER — Telehealth (INDEPENDENT_AMBULATORY_CARE_PROVIDER_SITE_OTHER): Payer: Medicaid Other | Admitting: Nurse Practitioner

## 2023-08-21 DIAGNOSIS — R1084 Generalized abdominal pain: Secondary | ICD-10-CM

## 2023-08-21 MED ORDER — TRAMADOL HCL 50 MG PO TABS
50.0000 mg | ORAL_TABLET | Freq: Two times a day (BID) | ORAL | 0 refills | Status: DC | PRN
Start: 2023-08-21 — End: 2023-08-26

## 2023-08-21 NOTE — Progress Notes (Signed)
Virtual Visit via Video Note  I connected with Sharon Chan on 08/21/23 at  1:00 PM EDT by a video enabled telemedicine application and verified that I am speaking with the correct person using two identifiers.  Location: Patient: home Provider: office   I discussed the limitations of evaluation and management by telemedicine and the availability of in person appointments. The patient expressed understanding and agreed to proceed.  History of Present Illness:  Patient presents today for follow-up visit.  She did connect through video visit today.  Overall she is doing well.  She continues to follow with hypertension clinic.  She is also followed by cardiology and neurology.  She does not need any refills today.  She states that she has been having hot flashes but states that she does have an appointment with OB/GYN later this week for evaluation.  Patient does complain today of ongoing abdominal pain.  She is followed by GI for pancreatic cyst.  We will order Ultram to use sparingly.  Denies f/c/s, n/v/d, hemoptysis, PND, leg swelling Denies chest pain or edema     Observations/Objective:     07/27/2023    8:26 AM 07/27/2023    8:22 AM 06/14/2023    7:50 AM  Vitals with BMI  Weight  213 lbs 6 oz   Systolic 143 152 962  Diastolic 90 98 88  Pulse  58 77      Assessment and Plan:  1. Generalized abdominal pain  - traMADol (ULTRAM) 50 MG tablet; Take 1 tablet (50 mg total) by mouth every 12 (twelve) hours as needed for up to 5 days.  Dispense: 10 tablet; Refill: 0   Follow up:  Follow up in 3 months     I discussed the assessment and treatment plan with the patient. The patient was provided an opportunity to ask questions and all were answered. The patient agreed with the plan and demonstrated an understanding of the instructions.   The patient was advised to call back or seek an in-person evaluation if the symptoms worsen or if the condition fails to improve as  anticipated.  I provided 24 minutes of non-face-to-face time during this encounter.   Ivonne Andrew, NP

## 2023-08-21 NOTE — Patient Instructions (Signed)
1. Generalized abdominal pain  - traMADol (ULTRAM) 50 MG tablet; Take 1 tablet (50 mg total) by mouth every 12 (twelve) hours as needed for up to 5 days.  Dispense: 10 tablet; Refill: 0   Follow up:  Follow up in 3 months

## 2023-08-22 ENCOUNTER — Telehealth: Payer: Self-pay | Admitting: Clinical

## 2023-08-22 ENCOUNTER — Ambulatory Visit: Payer: Medicaid Other | Admitting: Clinical

## 2023-08-22 ENCOUNTER — Other Ambulatory Visit: Payer: Self-pay

## 2023-08-22 DIAGNOSIS — F419 Anxiety disorder, unspecified: Secondary | ICD-10-CM

## 2023-08-22 DIAGNOSIS — K869 Disease of pancreas, unspecified: Secondary | ICD-10-CM

## 2023-08-22 DIAGNOSIS — F3289 Other specified depressive episodes: Secondary | ICD-10-CM | POA: Diagnosis not present

## 2023-08-22 NOTE — BH Specialist Note (Signed)
Integrated Behavioral Health via Telemedicine Visit  08/22/2023 Sharon Chan [id number]  Number of Integrated Behavioral Health Clinician visits: Additional Visit  Session Start time: 1400   Session End time: 1500  Total time in minutes: 60  Referring Provider: Angus Seller, NP Patient/Family location: [address], [facility] Provider location: [facility] All persons participating in visit: patient, CSW Types of Service: Individual psychotherapy and Video visit  I connected with Sharon Chan via Engineer, civil (consulting)  (Video is Surveyor, mining) and verified that I am speaking with the correct person using two identifiers. Discussed confidentiality: Yes   I discussed the limitations of telemedicine and the availability of in person appointments.  Discussed there is a possibility of technology failure and discussed alternative modes of communication if that failure occurs.  I discussed that engaging in this telemedicine visit, they consent to the provision of behavioral healthcare and the services will be billed under their insurance.  Patient and/or legal guardian expressed understanding and consented to Telemedicine visit: Yes   Presenting Concerns: Patient reports the following symptoms/concerns: depression and anxiety related to health concerns Duration of problem: several years; Severity of problem: moderate  Patient and/or Family's Strengths/Protective Factors: Social connections, Social and Emotional competence, Concrete supports in place (healthy food, safe environments, etc.), and Sense of purpose   Goals Addressed: Patient will:  Reduce symptoms of: anxiety and depression   Increase knowledge and/or ability of: coping skills and self-management skills   Demonstrate ability to: Increase healthy adjustment to current life circumstances  Progress towards Goals: Ongoing  Interventions: Interventions utilized:   Supportive Counseling Standardized Assessments completed: Not Needed  Supportive counseling today around continued challenges with family dynamics. Patient reports that unfortunately she has had to take out restraining orders on two of her dad's relatives. Provided emotional validation and reflective listening around this. Patient is providing care for her dad and feels that she has a positive relationship with him.  Patient is also still having abdominal pain. She is followed by GI and reports they're going to be scheduling an endoscopy. She has had ongoing stress and anxiety around her health concerns. Provided supportive counseling related to this. Patient has had trouble getting the tramad[facility] that [facility] She would [facility] to use [facility]. Sent this request to PCP to switch the prescription.   Patient and/or Family Response: Patient engaged in session.   Assessment: Patient currently experiencing depression and anxiety related to health conditions and anxiety related to social situations. She is also experiencing stress around challenging family dynamics.    Patient may benefit from CBT to explore unhelpful beliefs about self and re-frame these beliefs to improve mood and decrease anxiety.  Plan: Follow up with behavioral health clinician on: 09/06/23  I discussed the assessment and treatment plan with the patient and/or parent/guardian. They were provided an opportunity to ask questions and all were answered. They agreed with the plan and demonstrated an understanding of the instructions.   They were advised to call back or seek an in-person evaluation if the symptoms worsen or if the condition fails to improve as anticipated.  Abigail Butts, LCSW

## 2023-08-22 NOTE — Telephone Encounter (Signed)
Can we please transfer patient's tramadol prescription to [facility]? [facility]

## 2023-08-22 NOTE — Telephone Encounter (Signed)
[facility] is being [facility] due to the insurance they have on file has termed. They need her updated insurance information. They have rx ready for $13.99 if she wants to pay out of pocket.   LMTCB to advise

## 2023-08-23 ENCOUNTER — Other Ambulatory Visit (HOSPITAL_COMMUNITY): Payer: Self-pay

## 2023-08-23 ENCOUNTER — Other Ambulatory Visit: Payer: Self-pay | Admitting: Nurse Practitioner

## 2023-08-23 ENCOUNTER — Other Ambulatory Visit: Payer: Self-pay

## 2023-08-23 DIAGNOSIS — R1084 Generalized abdominal pain: Secondary | ICD-10-CM

## 2023-08-23 MED ORDER — TRAMADOL HCL 50 MG PO TABS
50.0000 mg | ORAL_TABLET | Freq: Two times a day (BID) | ORAL | 0 refills | Status: AC | PRN
  Filled 2023-08-23: qty 10, 5d supply, fill #0

## 2023-08-24 ENCOUNTER — Other Ambulatory Visit: Payer: Self-pay

## 2023-08-24 ENCOUNTER — Other Ambulatory Visit (HOSPITAL_COMMUNITY): Payer: Self-pay

## 2023-08-25 ENCOUNTER — Other Ambulatory Visit (HOSPITAL_COMMUNITY): Payer: Self-pay

## 2023-08-25 ENCOUNTER — Encounter: Payer: Self-pay | Admitting: Pharmacist Clinician (PhC)/ Clinical Pharmacy Specialist

## 2023-08-25 ENCOUNTER — Other Ambulatory Visit: Payer: Self-pay

## 2023-08-25 ENCOUNTER — Ambulatory Visit: Payer: Medicaid Other | Attending: Cardiology | Admitting: Pharmacist Clinician (PhC)/ Clinical Pharmacy Specialist

## 2023-08-25 VITALS — BP 141/91 | HR 68

## 2023-08-25 DIAGNOSIS — I10 Essential (primary) hypertension: Secondary | ICD-10-CM | POA: Diagnosis not present

## 2023-08-25 MED ORDER — CARVEDILOL 6.25 MG PO TABS
6.2500 mg | ORAL_TABLET | Freq: Two times a day (BID) | ORAL | 3 refills | Status: DC
  Filled 2023-08-25: qty 180, 90d supply, fill #0
  Filled 2023-08-26: qty 60, 30d supply, fill #0
  Filled 2023-09-27: qty 60, 30d supply, fill #1

## 2023-08-25 NOTE — Patient Instructions (Signed)
  Take your BP meds as follows:  AM:  valsartan hctz 320/25, spironolactone 50 mg, CARVEDILOL 6.25  PM:  amlodipine 10 mg, CARVEDILOL 6.25  Check your blood pressure at home daily (if able) and keep record of the readings.  Hypertension "High blood pressure"  Hypertension is often called "The Silent Killer." It rarely causes symptoms until it is extremely  high or has done damage to other organs in the body. For this reason, you should have your  blood pressure checked regularly by your physician. We will check your blood pressure  every time you see a provider at one of our offices.   Your blood pressure reading consists of two numbers. Ideally, blood pressure should be  below 120/80. The first ("top") number is called the systolic pressure. It measures the  pressure in your arteries as your heart beats. The second ("bottom") number is called the diastolic pressure. It measures the pressure in your arteries as the heart relaxes between beats.  The benefits of getting your blood pressure under control are enormous. A 10-point  reduction in systolic blood pressure can reduce your risk of stroke by 27% and heart failure by 28%  Your blood pressure goal is < 130/80  To check your pressure at home you will need to:  1. Sit up in a chair, with feet flat on the floor and back supported. Do not cross your ankles or legs. 2. Rest your left arm so that the cuff is about heart level. If the cuff goes on your upper arm,  then just relax the arm on the table, arm of the chair or your lap. If you have a wrist cuff, we  suggest relaxing your wrist against your chest (think of it as Pledging the Flag with the  wrong arm).  3. Place the cuff snugly around your arm, about 1 inch above the crook of your elbow. The  cords should be inside the groove of your elbow.  4. Sit quietly, with the cuff in place, for about 5 minutes. After that 5 minutes press the power  button to start a reading. 5. Do not  talk or move while the reading is taking place.  6. Record your readings on a sheet of paper. Although most cuffs have a memory, it is often  easier to see a pattern developing when the numbers are all in front of you.  7. You can repeat the reading after 1-3 minutes if it is recommended  Make sure your bladder is empty and you have not had caffeine or tobacco within the last 30 min  Always bring your blood pressure log with you to your appointments. If you have not brought your monitor in to be double checked for accuracy, please bring it to your next appointment.  You can find a list of quality blood pressure cuffs at validatebp.org

## 2023-08-25 NOTE — Assessment & Plan Note (Signed)
Assessment: BP is uncontrolled in office BP 141/91 mmHg;  above the goal (<130/80). Home readings higher than last visit, patient states compliance Tolerates current medicaitons well without any side effects Denies SOB, palpitation, chest pain, headaches,or swelling Reiterated the importance of regular exercise and low salt diet   Plan:  Start taking carvedilol 6.25 mg twice daily Move amlodipine from mornings to evenings Continue taking all other medications Patient to keep record of BP readings with heart rate and report to [name] at the next visit Patient to follow up with PharmD in 1 month (Group 1 visit 3)  Labs ordered today:  none

## 2023-08-25 NOTE — Progress Notes (Signed)
Office Visit    Patient Name: Sharon Chan Date of Encounter: 08/25/2023  Primary Care Provider:  Ivonne Andrew, NP Primary Cardiologist:  Chilton Si MD  Chief Complaint    Hypertension - Advanced hypertension clinic  Past Medical History   CVA 2018 L thalamic stroke  HLD 5/24 LDL 133 now on atorvastatin 80, evolocumab 140  DM2 4/24 A1c 5.6, on ozempic  Sickle cell trait     Allergies  Allergen Reactions   Penicillins Anaphylaxis   Lisinopril Cough   Other Itching, Rash and Other (See Comments)    Strong sunlight - polymorphous light eruption    History of Present Illness    Sharon Chan is a 47 y.o. female patient who was referred to the Advanced Hypertension Clinic by Angus Seller NP.  Prior to seeing Dr. Duke Salvia, patient had been working with Magdalene Molly PharmD in regards to her blood pressure.  They have another scheduled appointment for September.  When she saw Dr. Duke Salvia her pressure in the office was 140/94.  Spironolactone was increased to 25 mg daily and secondary screening was ordered.  She was agreeable to join our RPM research and randomized to group 1.   At her first follow up visit she was feeling well and had a pressure of 143/90.  Valsartan hctz was increased from 320/12.5 to 320/25 daily  Today she returns for her second follow up visit.  Since I saw her last she spoke with Catie Clearance Coots PharmD and noted home BP readings had been elevated for a few days into the 180/100 range, rather than her usual 140/80's.  She was feeling some positional hypotensive symptoms and increased lethargy.  She was encouraged to go to ED for evaluation, as she does have history of stroke.  She chose not to do this.  Now notes occasional nausea from pancreatic issues, not affecting her eating habits, but pain can elevate pressure  Blood Pressure Goal:  130/80  Current Medications: amlodipine 10 mg every day am, spironolactone 50 mg every day am,  valsartan hctz 320/12.5 mg every day - late afternoon  Family Hx:  mother living (hypertension); father died 71 (suicide), sister healthy, brother deceased   Social Hx:      Tobacco: no  Alcohol: no  Caffeine: no  Diet: mostly home cooked foods, no added salt, chicken , fish and [place], vegetables fresh; doesn't snack much   Exercise: walks twice daily about 1 mile, each time, 2 miles on treadmill daily; resistance band; lifting.caring for 160 lb man with parkinsons  Home BP readings:  has been checking twice daily with [employer] Grp 1 cuff 20 AM readings -  154/85  HR 80  (previous average 130/81  HR 72)   20 PM readings -  149/83  HR 77  (previous average 140/81  HR 78)    Accessory Clinical Findings    Lab Results  Component Value Date   CREATININE 0.94 05/10/2023   BUN 10 05/10/2023   NA 143 05/10/2023   K 3.8 05/10/2023   CL 106 05/10/2023   CO2 26 05/10/2023   Lab Results  Component Value Date   ALT 11 05/10/2023   AST 12 05/10/2023   ALKPHOS 67 05/10/2023   BILITOT 0.3 05/10/2023   Lab Results  Component Value Date   HGBA1C 5.6 02/13/2023    Screening for Secondary Hypertension:      06/01/2023   10:06 AM  Causes  Drugs/Herbals Screened     -  Comments no EtoH, caffeine, tobacco.  Limits sodium.  Renovascular HTN Screened     - Comments Check renal artery Dopplers  Sleep Apnea Screened     - Comments no OSA on sleep study  Thyroid Disease Screened     - Comments check TSH  Hyperaldosteronism Screened     - Comments Check renin and aldo  Pheochromocytoma Screened     - Comments check catecholamines and metanephries  Cushing's Syndrome Screened     - Comments AM cortisol  Hyperparathyroidism Screened  Coarctation of the Aorta Screened     - Comments BP symmetric  Compliance Screened    Relevant Labs/Studies:    Latest Ref Rng & Units 05/10/2023    9:52 AM 03/30/2023   10:46 AM 03/13/2023   12:07 PM  Basic Labs  Sodium 134 - 144 mmol/L 143  143  142    Potassium 3.5 - 5.2 mmol/L 3.8  3.7  4.1   Creatinine 0.57 - 1.00 mg/dL 1.61  0.96  0.45        Latest Ref Rng & Units 02/03/2020    2:12 PM 12/03/2018    2:16 PM  Thyroid   TSH 0.450 - 4.500 uIU/mL 0.747  0.57                 Home Medications    Current Outpatient Medications  Medication Sig Dispense Refill   carvedilol (COREG) 6.25 MG tablet Take 1 tablet (6.25 mg total) by mouth 2 (two) times daily. 180 tablet 3   albuterol (VENTOLIN HFA) 108 (90 Base) MCG/ACT inhaler Inhale 2 puffs into the lungs every 6 (six) hours as needed for wheezing or shortness of breath. 1 each 11   amLODipine (NORVASC) 10 MG tablet Take 1 tablet (10 mg total) by mouth every evening. 30 tablet 11   aspirin EC 81 MG tablet Take 1 tablet (81 mg total) by mouth in the morning. 30 tablet 11   atorvastatin (LIPITOR) 80 MG tablet Take 1 tablet (80 mg total) by mouth daily. 90 tablet 3   blood glucose meter kit and supplies KIT Dispense based on patient and insurance preference. Use up to four times daily as directed. (FOR ICD-9 250.00, 250.01). 1 each 0   cyclobenzaprine (FLEXERIL) 5 MG tablet TAKE ONE TABLET BY MOUTH THREE TIMES DAILY AS NEEDED (VIAL) 30 tablet 11   diphenhydrAMINE (BENADRYL) 25 MG tablet Take 25 mg by mouth every 6 (six) hours as needed for allergies.     divalproex (DEPAKOTE ER) 500 MG 24 hr tablet Take 1 tablet (500 mg total) by mouth daily. 90 tablet 1   eszopiclone (LUNESTA) 1 MG TABS tablet Take 1 tablet (1 mg total) by mouth at bedtime as needed for sleep. Take immediately before bedtime 30 tablet 0   Evolocumab (REPATHA SURECLICK) 140 MG/ML SOAJ Inject 140 mg into the skin every 14 days. (Patient not taking: Reported on 08/01/2023) 6 mL 3   Galcanezumab-gnlm (EMGALITY) 120 MG/ML SOAJ Inject 1 Pen into the skin every 30 (thirty) days. (Patient not taking: Reported on 08/01/2023) 1 mL 11   nortriptyline (PAMELOR) 25 MG capsule Take 3 capsules (75 mg total) by mouth at bedtime. 180 capsule 1    OneTouch Delica Lancets 33G MISC TES 2 (TWO) TIMES DAILY. 100 each 5   Semaglutide,0.25 or 0.5MG /DOS, (OZEMPIC, 0.25 OR 0.5 MG/DOSE,) 2 MG/3ML SOPN INJECT 0.25 MG WEEKLY FOR 4 WEEKS, THEN INCREASE TO 0.5 MG WEEKLY (Patient not taking: Reported on 08/01/2023) 3 mL  1   spironolactone (ALDACTONE) 50 MG tablet Take 1 tablet (50 mg total) by mouth daily. 90 tablet 3   topiramate (TOPAMAX) 100 MG tablet Take 1 tablet (100 mg total) by mouth in the morning AND 2 tablets (200 mg total) every evening. 270 tablet 3   traMADol (ULTRAM) 50 MG tablet Take 1 tablet (50 mg total) by mouth every 12 (twelve) hours as needed for up to 5 days. 10 tablet 0   valsartan-hydrochlorothiazide (DIOVAN-HCT) 320-25 MG tablet Take 1 tablet by mouth daily. 90 tablet 3   No current facility-administered medications for this visit.     Assessment & Plan   HYPERTENSION CONTROL Vitals:   08/25/23 0843 08/25/23 0847  BP: (!) 147/95 (!) 141/91    The patient's blood pressure is elevated above target today.  In order to address the patient's elevated BP: Blood pressure will be monitored at home to determine if medication changes need to be made.; A new medication was prescribed today.      Benign essential HTN Assessment: BP is uncontrolled in office BP 141/91 mmHg;  above the goal (<130/80). Home readings higher than last visit, patient states compliance Tolerates current medicaitons well without any side effects Denies SOB, palpitation, chest pain, headaches,or swelling Reiterated the importance of regular exercise and low salt diet   Plan:  Start taking carvedilol 6.25 mg twice daily Move amlodipine from mornings to evenings Continue taking all other medications Patient to keep record of BP readings with heart rate and report to [name] at the next visit Patient to follow up with PharmD in 1 month (Group 1 visit 3)  Labs ordered today:  none   Phillips Hay PharmD CPP [facility]  [address] [place], [place] [address] [phone]

## 2023-08-26 ENCOUNTER — Other Ambulatory Visit (HOSPITAL_COMMUNITY): Payer: Self-pay

## 2023-08-28 ENCOUNTER — Other Ambulatory Visit (HOSPITAL_COMMUNITY): Payer: Self-pay

## 2023-08-28 ENCOUNTER — Other Ambulatory Visit: Payer: Self-pay

## 2023-08-29 ENCOUNTER — Other Ambulatory Visit (HOSPITAL_COMMUNITY): Payer: Self-pay

## 2023-08-29 ENCOUNTER — Other Ambulatory Visit: Payer: Self-pay

## 2023-09-06 ENCOUNTER — Ambulatory Visit: Payer: Medicaid Other | Admitting: Clinical

## 2023-09-06 DIAGNOSIS — F3289 Other specified depressive episodes: Secondary | ICD-10-CM

## 2023-09-06 DIAGNOSIS — F419 Anxiety disorder, unspecified: Secondary | ICD-10-CM

## 2023-09-07 NOTE — BH Specialist Note (Signed)
Integrated Behavioral Health via Telemedicine Visit  09/07/2023 Sharon Chan [id number]  Number of Integrated Behavioral Health Clinician visits: Additional Visit  Session Start time: 1400   Session End time: 1458  Total time in minutes: 58  Referring Provider: Angus Seller, NP Patient/Family location: [address], [facility] Provider location: [facility] All persons participating in visit: patient, CSW Types of Service: Individual psychotherapy and Video visit  I connected with Sharon Chan via Engineer, civil (consulting)  (Video is Surveyor, mining) and verified that I am speaking with the correct person using two identifiers. Discussed confidentiality: Yes   I discussed the limitations of telemedicine and the availability of in person appointments.  Discussed there is a possibility of technology failure and discussed alternative modes of communication if that failure occurs.  I discussed that engaging in this telemedicine visit, they consent to the provision of behavioral healthcare and the services will be billed under their insurance.  Patient and/or legal guardian expressed understanding and consented to Telemedicine visit: Yes   Presenting Concerns: Patient reports the following symptoms/concerns: depression and anxiety related to health concerns Duration of problem: several years; Severity of problem: moderate  Patient and/or Family's Strengths/Protective Factors: Social connections, Social and Emotional competence, Concrete supports in place (healthy food, safe environments, etc.), and Sense of purpose   Goals Addressed: Patient will: Reduce symptoms of: anxiety and depression   Increase knowledge and/or ability of: coping skills and self-management skills   Demonstrate ability to: Increase healthy adjustment to current life circumstances  Progress towards Goals: Ongoing  Interventions: Interventions utilized:   Supportive Counseling Standardized Assessments completed: Not Needed  Patient experiencing stress related to challenging family dynamics. She cares for her father [figure] and is navigating interpersonal issues with his family. Discussed safety and boundaries and taking care of her own needs in this context. Patient exhibits improvement in boundary setting and advocating for herself. Provided emotional validation and reflective listening around the challenges patient is experiencing. Also provided perspective on the issues as it relates to patient's safety.   Patient and/or Family Response: Patient engaged in session.   Assessment: Patient currently experiencing depression and anxiety related to health conditions and anxiety related to social situations. She is also experiencing stress around challenging family dynamics.    Patient may benefit from CBT to explore unhelpful beliefs about self and re-frame these beliefs to improve mood and decrease anxiety.  Plan: Follow up with behavioral health clinician on: 09/18/23  I discussed the assessment and treatment plan with the patient and/or parent/guardian. They were provided an opportunity to ask questions and all were answered. They agreed with the plan and demonstrated an understanding of the instructions.   They were advised to call back or seek an in-person evaluation if the symptoms worsen or if the condition fails to improve as anticipated.  Abigail Butts, LCSW

## 2023-09-08 ENCOUNTER — Ambulatory Visit: Payer: 59 | Admitting: Neurology

## 2023-09-18 ENCOUNTER — Ambulatory Visit: Payer: Medicaid Other | Admitting: Clinical

## 2023-09-18 DIAGNOSIS — F419 Anxiety disorder, unspecified: Secondary | ICD-10-CM

## 2023-09-18 DIAGNOSIS — F3289 Other specified depressive episodes: Secondary | ICD-10-CM | POA: Diagnosis not present

## 2023-09-19 NOTE — BH Specialist Note (Signed)
Integrated Behavioral Health via Telemedicine Visit  09/19/2023 Sharon Chan [id number]  Number of Integrated Behavioral Health Clinician visits: Additional Visit  Session Start time: 1400   Session End time: 1500  Total time in minutes: 60  Referring Provider: Angus Seller, NP Patient/Family location: [address] [facility] Provider location: [facility] All persons participating in visit: patient, CSW Types of Service: Individual psychotherapy and Video visit  I connected with Sharon Chan via Engineer, civil (consulting)  (Video is Surveyor, mining) and verified that I am speaking with the correct person using two identifiers. Discussed confidentiality: Yes   I discussed the limitations of telemedicine and the availability of in person appointments.  Discussed there is a possibility of technology failure and discussed alternative modes of communication if that failure occurs.  I discussed that engaging in this telemedicine visit, they consent to the provision of behavioral healthcare and the services will be billed under their insurance.  Patient and/or legal guardian expressed understanding and consented to Telemedicine visit: Yes   Presenting Concerns: Patient reports the following symptoms/concerns: depression and anxiety related to health concerns Duration of problem: several years; Severity of problem: moderate  Patient and/or Family's Strengths/Protective Factors: Social connections, Social and Emotional competence, Concrete supports in place (healthy food, safe environments, etc.), and Sense of purpose   Goals Addressed: Patient will:  Reduce symptoms of: anxiety and depression   Increase knowledge and/or ability of: coping skills and self-management skills   Demonstrate ability to: Increase healthy adjustment to current life circumstances  Progress towards Goals: Ongoing  Interventions: Interventions utilized:   Supportive Counseling Standardized Assessments completed: Not Needed  Supportive counseling provided today. Patient has continued to navigate challenging family dynamics. She reports she has been able to stand up for herself more lately. Provided emotional validation and reflective listening around these changes that patient has been making in interpersonal interactions. Discussed transition, as CSW will be leaving the practice at the end of the month. Patient does not currently want referral to another therapist. CSW to send information on therapists in network for patient to refer to in the future if needed.  Patient and/or Family Response: Patient engaged in session.   Assessment: Patient currently experiencing depression and anxiety related to health conditions and anxiety related to social situations. She is also experiencing stress around challenging family dynamics.    Patient may benefit from CBT to explore unhelpful beliefs about self and re-frame these beliefs to improve mood and decrease anxiety.  Plan: Follow up with behavioral health clinician on: 10/03/23 Referral(s): Counselor  I discussed the assessment and treatment plan with the patient and/or parent/guardian. They were provided an opportunity to ask questions and all were answered. They agreed with the plan and demonstrated an understanding of the instructions.   They were advised to call back or seek an in-person evaluation if the symptoms worsen or if the condition fails to improve as anticipated.  Abigail Butts, LCSW

## 2023-09-20 ENCOUNTER — Other Ambulatory Visit (HOSPITAL_COMMUNITY): Payer: Self-pay

## 2023-09-20 ENCOUNTER — Other Ambulatory Visit: Payer: Self-pay

## 2023-09-27 ENCOUNTER — Encounter: Payer: Self-pay | Admitting: Pharmacist Clinician (PhC)/ Clinical Pharmacy Specialist

## 2023-09-27 ENCOUNTER — Other Ambulatory Visit: Payer: Self-pay

## 2023-09-27 ENCOUNTER — Ambulatory Visit: Payer: Medicaid Other | Attending: Cardiology | Admitting: Pharmacist Clinician (PhC)/ Clinical Pharmacy Specialist

## 2023-09-27 VITALS — BP 164/110 | HR 64

## 2023-09-27 DIAGNOSIS — I10 Essential (primary) hypertension: Secondary | ICD-10-CM

## 2023-09-27 NOTE — Progress Notes (Unsigned)
Office Visit    Patient Name: Sharon Chan Date of Encounter: 09/27/2023  Primary Care Provider:  Ivonne Andrew, NP Primary Cardiologist:  Chilton Si MD  Chief Complaint    Hypertension - Advanced hypertension clinic  Past Medical History   CVA 2018 L thalamic stroke  HLD 5/24 LDL 133 now on atorvastatin 80, evolocumab 140  DM2 4/24 A1c 5.6, on ozempic  Sickle cell trait     Allergies  Allergen Reactions   Penicillins Anaphylaxis   Lisinopril Cough   Other Itching, Rash and Other (See Comments)    Strong sunlight - polymorphous light eruption    History of Present Illness    Sharon Chan is a 47 y.o. female patient who was referred to the Advanced Hypertension Clinic by Angus Seller NP.  Prior to seeing Dr. Duke Salvia, patient had been working with Magdalene Molly PharmD in regards to her blood pressure.  They have another scheduled appointment for September.  When she saw Dr. Duke Salvia her pressure in the office was 140/94.  Spironolactone was increased to 25 mg daily and secondary screening was ordered.  She was agreeable to join our RPM research and randomized to group 1.   At her first follow up visit she was feeling well and had a pressure of 143/90.  Valsartan hctz was increased from 320/12.5 to 320/25 daily.  At her second follow up, pressure ws still elevated at 141/91, so carvedilol 6.25 mg bid was added in.    Today she returns for her third follow up visit.  Home blood pressure readings have been doing much better over the past month, but she does note that she'll have increased readings around the time of her menstrual cycle.  She reports bloating but no cramping during these times.  Since her last visit she has increased her exercise, now also going to the pool four days per week and swimming laps.    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; going to pool 4 times per week, about 12 laps, working with a PT in the church on occasion  Home BP readings:  has been checking twice daily with Vivify Grp 1 cuff - read within 10 points in office 09/27/23 21 AM readings -  123/76    (previous average 154/85)   21 PM readings -  133/79    (previous average 149/83)    Accessory Clinical Findings    Lab Results  Component Value Date   CREATININE 0.94 05/10/2023   BUN 10 05/10/2023   NA 143 05/10/2023   K 3.8 05/10/2023   CL 106 05/10/2023   CO2 26 05/10/2023   Lab Results  Component Value Date   ALT 11 05/10/2023   AST 12 05/10/2023   ALKPHOS 67 05/10/2023   BILITOT 0.3 05/10/2023   Lab Results  Component Value Date   HGBA1C 5.6 02/13/2023    Screening for Secondary Hypertension:  06/01/2023   10:06 AM  Causes  Drugs/Herbals Screened     - Comments no EtoH, caffeine, tobacco.  Limits sodium.  Renovascular HTN Screened     - Comments Check renal artery Dopplers  Sleep Apnea Screened     - Comments no OSA on sleep study  Thyroid Disease Screened     - Comments check TSH  Hyperaldosteronism Screened     - Comments Check renin and aldo  Pheochromocytoma Screened     - Comments check catecholamines and metanephries  Cushing's Syndrome Screened     - Comments AM cortisol  Hyperparathyroidism Screened  Coarctation of the Aorta Screened     - Comments BP symmetric  Compliance Screened    Relevant Labs/Studies:    Latest Ref Rng & Units 05/10/2023    9:52 AM 03/30/2023    10:46 AM 03/13/2023   12:07 PM  Basic Labs  Sodium 134 - 144 mmol/L 143  143  142   Potassium 3.5 - 5.2 mmol/L 3.8  3.7  4.1   Creatinine 0.57 - 1.00 mg/dL 1.61  0.96  0.45        Latest Ref Rng & Units 02/03/2020    2:12 PM 12/03/2018    2:16 PM  Thyroid   TSH 0.450 - 4.500 uIU/mL 0.747  0.57                 Home Medications    Current Outpatient Medications  Medication Sig Dispense Refill   albuterol (VENTOLIN HFA) 108 (90 Base) MCG/ACT inhaler Inhale 2 puffs into the lungs every 6 (six) hours as needed for wheezing or shortness of breath. 1 each 11   amLODipine (NORVASC) 10 MG tablet Take 1 tablet (10 mg total) by mouth every evening. 30 tablet 11   aspirin EC 81 MG tablet Take 1 tablet (81 mg total) by mouth in the morning. 30 tablet 11   atorvastatin (LIPITOR) 80 MG tablet Take 1 tablet (80 mg total) by mouth daily. 90 tablet 3   blood glucose meter kit and supplies KIT Dispense based on patient and insurance preference. Use up to four times daily as directed. (FOR ICD-9 250.00, 250.01). 1 each 0   carvedilol (COREG) 6.25 MG tablet Take 1 tablet (6.25 mg total) by mouth 2 (two) times daily. 180 tablet 3   cyclobenzaprine (FLEXERIL) 5 MG tablet TAKE ONE TABLET BY MOUTH THREE TIMES DAILY AS NEEDED (VIAL) 30 tablet 11   diphenhydrAMINE (BENADRYL) 25 MG tablet Take 25 mg by mouth every 6 (six) hours as needed for allergies.     divalproex (DEPAKOTE ER) 500 MG 24 hr tablet Take 1 tablet (500 mg total) by mouth daily. 90 tablet 1   eszopiclone (LUNESTA) 1 MG TABS tablet Take 1 tablet (1 mg total) by mouth at bedtime as needed for sleep. Take immediately before bedtime 30 tablet 0   Evolocumab (REPATHA SURECLICK) 140 MG/ML SOAJ Inject 140 mg into the skin every 14 days. (Patient not taking: Reported on 08/01/2023) 6 mL 3   Galcanezumab-gnlm (EMGALITY) 120 MG/ML SOAJ Inject 1 Pen into the skin every 30 (thirty) days. (Patient not taking: Reported on 08/01/2023) 1 mL 11   nortriptyline  (PAMELOR) 25 MG capsule Take 3 capsules (75 mg total) by mouth at bedtime. 180 capsule 1   OneTouch Delica Lancets 33G MISC TES 2 (TWO) TIMES DAILY. 100 each 5   Semaglutide,0.25 or 0.5MG /DOS, (OZEMPIC, 0.25 OR 0.5 MG/DOSE,) 2 MG/3ML SOPN INJECT 0.25 MG WEEKLY FOR 4  WEEKS, THEN INCREASE TO 0.5 MG WEEKLY (Patient not taking: Reported on 08/01/2023) 3 mL 1   spironolactone (ALDACTONE) 50 MG tablet Take 1 tablet (50 mg total) by mouth daily. 90 tablet 3   topiramate (TOPAMAX) 100 MG tablet Take 1 tablet (100 mg total) by mouth in the morning AND 2 tablets (200 mg total) every evening. 270 tablet 3   valsartan-hydrochlorothiazide (DIOVAN-HCT) 320-25 MG tablet Take 1 tablet by mouth daily. 90 tablet 3   No current facility-administered medications for this visit.     Assessment & Plan   HYPERTENSION CONTROL Vitals:   09/27/23 0844 09/27/23 0906  BP: (!) 158/99 (!) 164/110    The patient's blood pressure is elevated above target today. {Click here if intervention needs to be changed Refresh Note :1}  In order to address the patient's elevated BP: The blood pressure is usually elevated in clinic.  Blood pressures monitored at home have been optimal.; Blood pressure will be monitored at home to determine if medication changes need to be made.      Essential hypertension Assessment: BP is uncontrolled in office BP 158/99 mmHg;  above the goal (<130/80). Home cuff reads within 10 points, home readings average 123/76 in am, 133/79 in pm Tolerates current medications well without any side effects Denies SOB, palpitation, chest pain, headaches,or swelling Reiterated the importance of regular exercise and low salt diet   Plan:  Continue taking amlodipine 10 mg every day, spironolactone 50 mg every day, valsartan hctz 320/12.5 mg every day  Patient to keep record of BP readings with heart rate and report to [name] at the next visit Patient to follow up with Dr. Duke Salvia January 22 at 8 am  Labs  ordered today:  none   Phillips Hay PharmD CPP [facility]  [address] [place], [place] [address] [phone]

## 2023-09-27 NOTE — Patient Instructions (Signed)
Follow up appointment: January 22 at 8 am with Dr. Duke Salvia at the [facility] [place] office   Take your BP meds as follows:  Continue with your current medications Check your blood pressure at home daily (if able) and keep record of the readings.  Hypertension "High blood pressure"  Hypertension is often called "The Silent Killer." It rarely causes symptoms until it is extremely  high or has done damage to other organs in the body. For this reason, you should have your  blood pressure checked regularly by your physician. We will check your blood pressure  every time you see a provider at one of our offices.   Your blood pressure reading consists of two numbers. Ideally, blood pressure should be  below 120/80. The first ("top") number is called the systolic pressure. It measures the  pressure in your arteries as your heart beats. The second ("bottom") number is called the diastolic pressure. It measures the pressure in your arteries as the heart relaxes between beats.  The benefits of getting your blood pressure under control are enormous. A 10-point  reduction in systolic blood pressure can reduce your risk of stroke by 27% and heart failure by 28%  Your blood pressure goal is < 130/80  To check your pressure at home you will need to:  1. Sit up in a chair, with feet flat on the floor and back supported. Do not cross your ankles or legs. 2. Rest your left arm so that the cuff is about heart level. If the cuff goes on your upper arm,  then just relax the arm on the table, arm of the chair or your lap. If you have a wrist cuff, we  suggest relaxing your wrist against your chest (think of it as Pledging the Flag with the  wrong arm).  3. Place the cuff snugly around your arm, about 1 inch above the crook of your elbow. The  cords should be inside the groove of your elbow.  4. Sit quietly, with the cuff in place, for about 5 minutes. After that 5 minutes press the power  button to start a  reading. 5. Do not talk or move while the reading is taking place.  6. Record your readings on a sheet of paper. Although most cuffs have a memory, it is often  easier to see a pattern developing when the numbers are all in front of you.  7. You can repeat the reading after 1-3 minutes if it is recommended  Make sure your bladder is empty and you have not had caffeine or tobacco within the last 30 min  Always bring your blood pressure log with you to your appointments. If you have not brought your monitor in to be double checked for accuracy, please bring it to your next appointment.  You can find a list of quality blood pressure cuffs at validatebp.org

## 2023-09-27 NOTE — Assessment & Plan Note (Signed)
Assessment: BP is uncontrolled in office BP 158/99 mmHg;  above the goal (<130/80). Home cuff reads within 10 points, home readings average 123/76 in am, 133/79 in pm Tolerates current medications well without any side effects Denies SOB, palpitation, chest pain, headaches,or swelling Reiterated the importance of regular exercise and low salt diet   Plan:  Continue taking amlodipine 10 mg every day, spironolactone 50 mg every day, valsartan hctz 320/12.5 mg every day  Patient to keep record of BP readings with heart rate and report to [name] at the next visit Patient to follow up with Dr. Duke Salvia January 22 at 8 am  Labs ordered today:  none

## 2023-10-03 ENCOUNTER — Ambulatory Visit: Payer: Medicaid Other | Admitting: Clinical

## 2023-10-03 ENCOUNTER — Other Ambulatory Visit (HOSPITAL_COMMUNITY): Payer: Self-pay

## 2023-10-03 DIAGNOSIS — F419 Anxiety disorder, unspecified: Secondary | ICD-10-CM

## 2023-10-03 DIAGNOSIS — F3289 Other specified depressive episodes: Secondary | ICD-10-CM

## 2023-10-03 NOTE — BH Specialist Note (Unsigned)
Ok without therapist for now, did send some on mychart

## 2023-10-04 ENCOUNTER — Other Ambulatory Visit: Payer: Self-pay

## 2023-10-06 ENCOUNTER — Encounter (HOSPITAL_COMMUNITY): Payer: Self-pay | Admitting: Gastroenterology

## 2023-10-06 NOTE — Progress Notes (Signed)
Attempted to obtain medical history for pre op call via telephone, unable to reach at this time. HIPAA compliant voicemail message left requesting return call to pre surgical testing department.

## 2023-10-16 ENCOUNTER — Ambulatory Visit (HOSPITAL_COMMUNITY)
Admission: RE | Admit: 2023-10-16 | Discharge: 2023-10-16 | Disposition: A | Discharge status: Home / Self Care | Payer: Medicaid Other | Attending: Gastroenterology | Admitting: Gastroenterology

## 2023-10-16 ENCOUNTER — Other Ambulatory Visit: Payer: Self-pay

## 2023-10-16 ENCOUNTER — Ambulatory Visit (HOSPITAL_COMMUNITY): Payer: Medicaid Other

## 2023-10-16 ENCOUNTER — Encounter (HOSPITAL_COMMUNITY)
Admission: RE | Disposition: A | Discharge status: Home / Self Care | Payer: Self-pay | Source: Home / Self Care | Attending: Gastroenterology

## 2023-10-16 ENCOUNTER — Other Ambulatory Visit (HOSPITAL_COMMUNITY): Payer: Self-pay

## 2023-10-16 ENCOUNTER — Encounter (HOSPITAL_COMMUNITY): Payer: Self-pay | Admitting: Gastroenterology

## 2023-10-16 DIAGNOSIS — R932 Abnormal findings on diagnostic imaging of liver and biliary tract: Secondary | ICD-10-CM | POA: Diagnosis not present

## 2023-10-16 DIAGNOSIS — K449 Diaphragmatic hernia without obstruction or gangrene: Secondary | ICD-10-CM | POA: Diagnosis not present

## 2023-10-16 DIAGNOSIS — E119 Type 2 diabetes mellitus without complications: Secondary | ICD-10-CM | POA: Diagnosis not present

## 2023-10-16 DIAGNOSIS — K8689 Other specified diseases of pancreas: Secondary | ICD-10-CM

## 2023-10-16 DIAGNOSIS — I10 Essential (primary) hypertension: Secondary | ICD-10-CM | POA: Diagnosis not present

## 2023-10-16 DIAGNOSIS — K2950 Unspecified chronic gastritis without bleeding: Secondary | ICD-10-CM | POA: Insufficient documentation

## 2023-10-16 DIAGNOSIS — R896 Abnormal cytological findings in specimens from other organs, systems and tissues: Secondary | ICD-10-CM | POA: Insufficient documentation

## 2023-10-16 DIAGNOSIS — R8589 Other abnormal findings in specimens from digestive organs and abdominal cavity: Secondary | ICD-10-CM | POA: Diagnosis not present

## 2023-10-16 DIAGNOSIS — K2289 Other specified disease of esophagus: Secondary | ICD-10-CM | POA: Diagnosis not present

## 2023-10-16 DIAGNOSIS — Z5986 Financial insecurity: Secondary | ICD-10-CM | POA: Insufficient documentation

## 2023-10-16 DIAGNOSIS — K3189 Other diseases of stomach and duodenum: Secondary | ICD-10-CM | POA: Diagnosis not present

## 2023-10-16 DIAGNOSIS — K869 Disease of pancreas, unspecified: Secondary | ICD-10-CM

## 2023-10-16 DIAGNOSIS — Z8673 Personal history of transient ischemic attack (TIA), and cerebral infarction without residual deficits: Secondary | ICD-10-CM | POA: Insufficient documentation

## 2023-10-16 DIAGNOSIS — D573 Sickle-cell trait: Secondary | ICD-10-CM | POA: Diagnosis not present

## 2023-10-16 DIAGNOSIS — I899 Noninfective disorder of lymphatic vessels and lymph nodes, unspecified: Secondary | ICD-10-CM

## 2023-10-16 DIAGNOSIS — K862 Cyst of pancreas: Secondary | ICD-10-CM | POA: Insufficient documentation

## 2023-10-16 DIAGNOSIS — K2970 Gastritis, unspecified, without bleeding: Secondary | ICD-10-CM

## 2023-10-16 HISTORY — PX: BIOPSY: SHX5522

## 2023-10-16 HISTORY — PX: FINE NEEDLE ASPIRATION: SHX5430

## 2023-10-16 HISTORY — PX: ESOPHAGOGASTRODUODENOSCOPY (EGD) WITH PROPOFOL: SHX5813

## 2023-10-16 HISTORY — PX: EUS: SHX5427

## 2023-10-16 LAB — GLUCOSE, CAPILLARY
Glucose-Capillary: 84 mg/dL (ref 70–99)
Glucose-Capillary: 103 mg/dL — ABNORMAL HIGH (ref 70–99)

## 2023-10-16 SURGERY — UPPER ENDOSCOPIC ULTRASOUND (EUS) RADIAL
Anesthesia: Monitor Anesthesia Care

## 2023-10-16 MED ORDER — PROPOFOL 500 MG/50ML IV EMUL
INTRAVENOUS | Status: DC | PRN
  Administered 2023-10-16: 150 ug/kg/min via INTRAVENOUS

## 2023-10-16 MED ORDER — CIPROFLOXACIN HCL 500 MG PO TABS
500.0000 mg | ORAL_TABLET | Freq: Two times a day (BID) | ORAL | 0 refills | Status: AC
Start: 2023-10-16 — End: 2023-10-19
  Filled 2023-10-16 (×2): qty 6, 3d supply, fill #0

## 2023-10-16 MED ORDER — PROPOFOL 10 MG/ML IV BOLUS
INTRAVENOUS | Status: DC | PRN
  Administered 2023-10-16 (×2): 30 mg via INTRAVENOUS
  Administered 2023-10-16: 50 mg via INTRAVENOUS
  Administered 2023-10-16: 20 mg via INTRAVENOUS
  Administered 2023-10-16: 30 mg via INTRAVENOUS
  Administered 2023-10-16: 20 mg via INTRAVENOUS
  Administered 2023-10-16 (×2): 30 mg via INTRAVENOUS

## 2023-10-16 MED ORDER — ESMOLOL HCL 100 MG/10ML IV SOLN
INTRAVENOUS | Status: DC | PRN
  Administered 2023-10-16 (×2): 10 mg via INTRAVENOUS

## 2023-10-16 MED ORDER — PROPOFOL 1000 MG/100ML IV EMUL
INTRAVENOUS | Status: AC
  Filled 2023-10-16: qty 100

## 2023-10-16 MED ORDER — SODIUM CHLORIDE 0.9 % IV SOLN: INTRAVENOUS | Status: DC

## 2023-10-16 MED ORDER — PROPOFOL 500 MG/50ML IV EMUL
INTRAVENOUS | Status: AC
  Filled 2023-10-16: qty 50

## 2023-10-16 MED ORDER — CIPROFLOXACIN IN D5W 400 MG/200ML IV SOLN
INTRAVENOUS | Status: DC | PRN
  Administered 2023-10-16: 400 mg via INTRAVENOUS

## 2023-10-16 MED ORDER — CIPROFLOXACIN IN D5W 400 MG/200ML IV SOLN
INTRAVENOUS | Status: AC
  Filled 2023-10-16: qty 200

## 2023-10-16 MED ORDER — LIDOCAINE HCL (CARDIAC) PF 100 MG/5ML IV SOSY
PREFILLED_SYRINGE | INTRAVENOUS | Status: DC | PRN
  Administered 2023-10-16: 30 mg via INTRAVENOUS
  Administered 2023-10-16: 50 mg via INTRAVENOUS

## 2023-10-16 NOTE — Op Note (Signed)
[facility] Patient Name: Sharon Chan Procedure Date: 10/16/2023 MRN: [id number] Attending MD: Corliss Parish , MD, [id number] Date of Birth: 08-06-1976 CSN: [id number] Age: 47 Admit Type: Outpatient Procedure:                Upper EUS Indications:              Pancreatic cyst on MRCP, Abnormal MRCP Providers:                Corliss Parish, MD, Lorenza Evangelist, RN,                            Kandice Robinsons, Technician, Alan Ripper, Technician Referring MD:              Medicines:                Monitored Anesthesia Care Complications:            No immediate complications. Estimated Blood Loss:     Estimated blood loss was minimal. Procedure:                Pre-Anesthesia Assessment:                           - Prior to the procedure, a History and Physical                            was performed, and patient medications and                            allergies were reviewed. The patient's tolerance of                            previous anesthesia was also reviewed. The risks                            and benefits of the procedure and the sedation                            options and risks were discussed with the patient.                            All questions were answered, and informed consent                            was obtained. Prior Anticoagulants: The patient has                            taken no anticoagulant or antiplatelet agents                            except for aspirin. ASA Grade Assessment: II - A                            patient with mild systemic disease. After reviewing  the risks and benefits, the patient was deemed in                            satisfactory condition to undergo the procedure.                           After obtaining informed consent, the endoscope was                            passed under direct vision. Throughout the                            procedure, the patient's blood  pressure, pulse, and                            oxygen saturations were monitored continuously. The                            [id number]-H190 ([id number]) Olympus endoscope was introduced                            through the mouth, and advanced to the second part                            of duodenum. The [id number]-Q190V ([id number]) Olympus                            duodenoscope was introduced through the mouth, and                            advanced to the area of papilla. The GF-[id number]                            ([id number]) Olympus linear ultrasound scope was                            introduced through the mouth, and advanced to the                            duodenum for ultrasound examination from the                            stomach and duodenum. The upper EUS was                            accomplished without difficulty. The patient                            tolerated the procedure. Scope In: Scope Out: Findings:      ENDOSCOPIC FINDING: :      No gross lesions were noted in the entire esophagus.      The Z-line was irregular and was found 36 cm from the incisors.      A 2 cm hiatal hernia was present.  Patchy mildly erythematous mucosa without bleeding was found in the       entire examined stomach. Biopsies were taken with a cold forceps for       histology and Helicobacter pylori testing.      No gross lesions were noted in the duodenal bulb, in the first portion       of the duodenum and in the second portion of the duodenum.      The major papilla was normal.      ENDOSONOGRAPHIC FINDING: :      An irregular lesion was identified in the pancreatic tail. The lesion       was cystic but had a solid component surrounding it that was more       hypoechoic than the rest of the pancreatic parenchyma. The lesion       measured 18.5 mm by 10.5 mm in maximal cross-sectional diameter with the       cystic component being present (8 mm x 6 mm cystic component). The outer       margins  were irregular but again more hypoechoic than the rest of the       pancreatic parenchyma. This is very close to the left kidney but not       invading it. An intact interface was seen between the mass and the       superior mesenteric artery, celiac trunk, splenic artery, portal vein       and splenoportal confluence suggesting a lack of invasion. Diagnostic       needle aspiration for fluid was performed. Color Doppler imaging was       utilized prior to needle puncture to confirm a lack of significant       vascular structures within the needle path. One pass was made with the       [facility] Scientific expect 22 gauge needle using a transgastric approach.       A stylet was used. The amount of fluid collected was approximately0.9       mL. The fluid was cloudy, white and thin. Samples were sent for CEA and       glucose and amylase concentration and cytology. Separately, fine needle       aspiration for cytology was performed of the hypoechoic lesion/area       after cyst decompression. Color Doppler imaging was utilized prior to       needle puncture to confirm a lack of significant vascular structures       within the needle path. Five passes were made with the same [facility]       Scientific expect 22 gauge needle using a transgastric approach. A       stylet was used. A cytotechnologist was present to evaluate the adequacy       of the specimen. Final cytology results are pending.      The diameter of the main pancreatic duct was normal in appearance and       measured:      - HOP 1.0 mm (head of pancreas)      - NOP 1.5 mm (neck of pancreas)      - BOP 0.7 mm (body of the pancreas)      - TOP 0.6 mm (tail of the pancreas).      Pancreatic parenchymal abnormalities were noted in the pancreatic head,       genu of the pancreas, pancreatic body and pancreatic tail. These  consisted of lobularity without honeycombing and hyperechoic strands.      There was no sign of significant  endosonographic abnormality in the       common bile duct (2.2 -> 4.8 mm) and in the common hepatic duct (5.7       mm). Ducts with regular contour were identified.      Endosonographic imaging of the ampulla showed no intramural       (subepithelial) lesion.      Endosonographic imaging in the visualized portion of the liver showed no       mass.      No malignant-appearing lymph nodes were visualized in the celiac region       (level 20), peripancreatic region and porta hepatis region.      The celiac region was visualized. Impression:               EGD impression:                           - No gross lesions in the entire esophagus. Z-line                            irregular, 36 cm from the incisors.                           - 2 cm hiatal hernia.                           - Erythematous mucosa in the stomach. Biopsied.                           - No gross lesions in the duodenal bulb, in the                            first portion of the duodenum and in the second                            portion of the duodenum.                           - Normal major papilla.                           EUS impression:                           - A lesion was identified in the pancreatic tail                            with a cystic component and some mild                            hypoechogenicity surrounding it. Cytology results                            are pending. However, the endosonographic  appearance is suspicious for a possible                            neuroendocrine tumor with cystic degeneration                            versus other cyst type. Fine-needle aspiration for                            fluid performed of the very small cystic cavity.                            Fine-needle aspiration for tissue of the hypoechoic                            region was performed after. This was staged T1 N0                            Mx by endosonographic  criteria and the staging                            applies if malignancy is confirmed.                           - Main pancreatic duct (MPD) diameter was measured.                            Endosonographically, the MPD had a normal                            appearance without dilation.                           - Pancreatic parenchymal abnormalities consisting                            of lobularity and hyperechoic strands were noted in                            the pancreatic head, genu of the pancreas,                            pancreatic body and pancreatic tail.                           - There was no sign of significant pathology in the                            common bile duct and in the common hepatic duct.                           - No malignant-appearing lymph nodes were  visualized in the celiac region (level 20),                            peripancreatic region and porta hepatis region. Moderate Sedation:      Not Applicable - Patient had care per Anesthesia. Recommendation:           - The patient will be observed post-procedure,                            until all discharge criteria are met.                           - Discharge patient to home.                           - Low fat diet for 1 week.                           - Ciprofloxacin 500 mg twice daily for 3 days to                            decrease risk of post interventional infection.                           - Observe patient's clinical course.                           - Await cytology results and await path results.                           - Pending results will discuss the next steps of                            evaluation and monitoring of this area. Potential                            CA 19?"9 and chromogranin A labs to be considered.                            We will decide what additional workup may be                            required including repeat  imaging.                           - The findings and recommendations were discussed                            with the patient. Procedure Code(s):        --- Professional ---                           [phone], Esophagogastroduodenoscopy, flexible,  transoral; with transendoscopic ultrasound-guided                            intramural or transmural fine needle                            aspiration/biopsy(s), (includes endoscopic                            ultrasound examination limited to the esophagus,                            stomach or duodenum, and adjacent structures)                           43239, 59, Esophagogastroduodenoscopy, flexible,                            transoral; with biopsy, single or multiple Diagnosis Code(s):        --- Professional ---                           K22.89, Other specified disease of esophagus                           K44.9, Diaphragmatic hernia without obstruction or                            gangrene                           K31.89, Other diseases of stomach and duodenum                           K86.89, Other specified diseases of pancreas                           K86.9, Disease of pancreas, unspecified                           I89.9, Noninfective disorder of lymphatic vessels                            and lymph nodes, unspecified                           K86.2, Cyst of pancreas                           R93.2, Abnormal findings on diagnostic imaging of                            liver and biliary tract CPT copyright 2022 [employer]. All rights reserved. The codes documented in this report are preliminary and upon coder review may  be revised to meet current compliance requirements. Corliss Parish, MD 10/16/2023 3:46:05 PM Number of Addenda: 0

## 2023-10-16 NOTE — Progress Notes (Signed)
Pt presented to endoscopy today for EGD with EUS with MD Mansouraty. Post-operatively, pt hypertensive with DBPs in the 100s. Pt noted to be hypertensive pre-operatively with a BP of 173/92. Pt states she did not take her blood pressure medication this AM. MDA Hodierne notified. Per MDA Hodierne, pt okay to be discharged with instructions to take blood pressure medications when she gets home. Instructions provided to patient. Patient demonstrates understanding. Pt discharged to home.  Eulas Post, RN 10/16/23 4:28 PM

## 2023-10-16 NOTE — Transfer of Care (Signed)
Immediate Anesthesia Transfer of Care Note  Patient: Sharon Chan  Procedure(s) Performed: UPPER ENDOSCOPIC ULTRASOUND (EUS) RADIAL BIOPSY ESOPHAGOGASTRODUODENOSCOPY (EGD) WITH PROPOFOL FINE NEEDLE ASPIRATION (FNA) LINEAR  Patient Location: PACU  Anesthesia Type:MAC  Level of Consciousness: awake, alert , oriented, and patient cooperative  Airway & Oxygen Therapy: Patient Spontanous Breathing  Post-op Assessment: Report given to RN and Post -op Vital signs reviewed and stable  Post vital signs: Reviewed and stable  Last Vitals:  Vitals Value Taken Time  BP    Temp    Pulse 95 10/16/23 1545  Resp 8 10/16/23 1545  SpO2 99 % 10/16/23 1545  Vitals shown include unfiled device data.  Last Pain:  Vitals:   10/16/23 1252  TempSrc: Temporal         Complications: No notable events documented.

## 2023-10-16 NOTE — Anesthesia Preprocedure Evaluation (Addendum)
Anesthesia Evaluation  Patient identified by MRN, date of birth, ID band Patient awake    Reviewed: Allergy & Precautions, NPO status , Patient's Chart, lab work & pertinent test results  Airway Mallampati: II  TM Distance: >3 FB Neck ROM: Full    Dental  (+) Missing,    Pulmonary neg pulmonary ROS   Pulmonary exam normal        Cardiovascular hypertension, Pt. on medications and Pt. on home beta blockers Normal cardiovascular exam     Neuro/Psych  Headaches, Seizures -,  PSYCHIATRIC DISORDERS Anxiety Depression     Neuromuscular disease CVA, Residual Symptoms    GI/Hepatic negative GI ROS, Neg liver ROS,,,  Endo/Other  diabetes  Patient on GLP-1 Agonist. Last dose over a month ago  Renal/GU Renal disease     Musculoskeletal negative musculoskeletal ROS (+)    Abdominal  (+) + obese  Peds  Hematology  (+) Blood dyscrasia, Sickle cell trait   Anesthesia Other Findings pancreatic lesion  Reproductive/Obstetrics Pregnancy test offered to and declined by patient                              Anesthesia Physical Anesthesia Plan  ASA: 3  Anesthesia Plan: MAC   Post-op Pain Management:    Induction:   PONV Risk Score and Plan: 2 and Propofol infusion and Treatment may vary due to age or medical condition  Airway Management Planned: Nasal Cannula  Additional Equipment:   Intra-op Plan:   Post-operative Plan:   Informed Consent: I have reviewed the patients History and Physical, chart, labs and discussed the procedure including the risks, benefits and alternatives for the proposed anesthesia with the patient or authorized representative who has indicated his/her understanding and acceptance.     Dental advisory given  Plan Discussed with: CRNA  Anesthesia Plan Comments:        Anesthesia Quick Evaluation

## 2023-10-16 NOTE — Discharge Instructions (Signed)

## 2023-10-16 NOTE — H&P (Signed)
GASTROENTEROLOGY PROCEDURE H&P NOTE   Primary Care Physician: Ivonne Andrew, NP  HPI: Sharon Chan is a 47 y.o. female who presents for EGD/EUS to evaluate pancreatic cystic lesion with nodularity/enhancement noted on 2 MRICPs.  Past Medical History:  Diagnosis Date   Anxiety    Chest pain of uncertain etiology 06/01/2023   CVA (cerebral vascular accident) (HCC)    Depression    DM type 2 (diabetes mellitus, type 2) (HCC)    HLD (hyperlipidemia)    HTN (hypertension)    Joint pain    Migraines    Neck pain    Obese    Palpitations 06/01/2023   Polycystic disease, ovaries    Right sided weakness    Seizures (HCC)    Shoulder pain    Sickle cell trait (HCC)    Thyroid nodule    Vitamin D deficiency 01/2020   Past Surgical History:  Procedure Laterality Date   LAPAROSCOPIC CHOLECYSTECTOMY     Current Facility-Administered Medications  Medication Dose Route Frequency Provider Last Rate Last Admin   0.9 %  sodium chloride infusion   Intravenous Continuous Mansouraty, Netty Starring., MD        Current Facility-Administered Medications:    0.9 %  sodium chloride infusion, , Intravenous, Continuous, Mansouraty, Netty Starring., MD Allergies  Allergen Reactions   Penicillins Anaphylaxis   Lisinopril Cough   Latex Rash   Other Itching, Rash and Other (See Comments)    Strong sunlight - polymorphous light eruption   Family History  Problem Relation Age of Onset   Heart attack Mother 7   Colon polyps Mother    Diabetes Mother    Hypertension Mother    Hyperlipidemia Mother    Post-traumatic stress disorder Father        committed suicide   Diabetes Father    Hypertension Sister    Stomach cancer Maternal Aunt    Breast cancer Maternal Grandmother    Breast cancer Paternal Grandmother    Thyroid cancer Paternal Grandmother    Colon cancer Neg Hx    Esophageal cancer Neg Hx    Pancreatic cancer Neg Hx    Social History   Socioeconomic History    Marital status: Single    Spouse name: Not on file   Number of children: 0   Years of education: Not on file   Highest education level: Not on file  Occupational History   Not on file  Tobacco Use   Smoking status: Never   Smokeless tobacco: Never  Vaping Use   Vaping status: Never Used  Substance and Sexual Activity   Alcohol use: No   Drug use: No   Sexual activity: Not Currently  Other Topics Concern   Not on file  Social History Narrative   Lives at home, mom currently staying with her   Left-handed   Caffeine: occasional decaf coffee or raspberry tea   One story home   No feeling on right side, but fully functioning   Social Determinants of Health   Financial Resource Strain: High Risk (02/12/2023)   Overall Financial Resource Strain (CARDIA)    Difficulty of Paying Living Expenses: Hard  Food Insecurity: Food Insecurity Present (02/12/2023)   Hunger Vital Sign    Worried About Running Out of Food in the Last Year: Often true    Ran Out of Food in the Last Year: Often true  Transportation Needs: Patient Declined (02/12/2023)   PRAPARE - Transportation    Lack  of Transportation (Medical): Patient declined    Lack of Transportation (Non-Medical): Patient declined  Physical Activity: Unknown (02/12/2023)   Exercise Vital Sign    Days of Exercise per Week: Patient declined    Minutes of Exercise per Session: 20 min  Recent Concern: Physical Activity - Insufficiently Active (12/22/2022)   Exercise Vital Sign    Days of Exercise per Week: 3 days    Minutes of Exercise per Session: 20 min  Stress: No Stress Concern Present (02/12/2023)   [employer] of Occupational Health - Occupational Stress Questionnaire    Feeling of Stress : Only a little  Social Connections: Unknown (02/12/2023)   Social Connection and Isolation Panel [NHANES]    Frequency of Communication with Friends and Family: Once a week    Frequency of Social Gatherings with Friends and Family: Patient  declined    Attends Religious Services: More than 4 times per year    Active Member of Clubs or Organizations: Yes    Attends Banker Meetings: Never    Marital Status: Never married  Intimate Partner Violence: Not At Risk (12/22/2022)   Humiliation, Afraid, Rape, and Kick questionnaire    Fear of Current or Ex-Partner: No    Emotionally Abused: No    Physically Abused: No    Sexually Abused: No    Physical Exam: Today's Vitals   10/09/23 1036 10/16/23 1252  BP:  (!) 173/92  Pulse:  63  Resp:  11  Temp:  98.2 F (36.8 C)  TempSrc:  Temporal  SpO2:  96%  Weight: 96.6 kg 96.6 kg  Height:  5\' 3"  (1.6 m)   Body mass index is 37.73 kg/m. GEN: NAD EYE: Sclerae anicteric ENT: MMM CV: Non-tachycardic GI: Soft, NT/ND NEURO:  Alert & Oriented x 3  Lab Results: No results for input(s): "WBC", "HGB", "HCT", "PLT" in the last 72 hours. BMET No results for input(s): "NA", "K", "CL", "CO2", "GLUCOSE", "BUN", "CREATININE", "CALCIUM" in the last 72 hours. LFT No results for input(s): "PROT", "ALBUMIN", "AST", "ALT", "ALKPHOS", "BILITOT", "BILIDIR", "IBILI" in the last 72 hours. PT/INR No results for input(s): "LABPROT", "INR" in the last 72 hours.   Impression / Plan: This is a 47 y.o.female who presents for EGD/EUS to evaluate pancreatic cystic lesion with nodularity/enhancement noted on 2 MRICPs.  The risks of an EUS including intestinal perforation, bleeding, infection, aspiration, and medication effects were discussed as was the possibility it may not give a definitive diagnosis if a biopsy is performed.  When a biopsy of the pancreas is done as part of the EUS, there is an additional risk of pancreatitis at the rate of about 1-2%.  It was explained that procedure related pancreatitis is typically mild, although it can be severe and even life threatening, which is why we do not perform random pancreatic biopsies and only biopsy a lesion/area we feel is concerning enough  to warrant the risk.  The risks and benefits of endoscopic evaluation/treatment were discussed with the patient and/or family; these include but are not limited to the risk of perforation, infection, bleeding, missed lesions, lack of diagnosis, severe illness requiring hospitalization, as well as anesthesia and sedation related illnesses.  The patient's history has been reviewed, patient examined, no change in status, and deemed stable for procedure.  The patient and/or family is agreeable to proceed.    Corliss Parish, MD [facility] Office # [phone]

## 2023-10-17 NOTE — Anesthesia Postprocedure Evaluation (Addendum)
Anesthesia Post Note  Patient: TEMPRENCE BODY  Procedure(s) Performed: UPPER ENDOSCOPIC ULTRASOUND (EUS) RADIAL BIOPSY ESOPHAGOGASTRODUODENOSCOPY (EGD) WITH PROPOFOL FINE NEEDLE ASPIRATION (FNA) LINEAR     Patient location during evaluation: PACU Anesthesia Type: MAC Level of consciousness: awake and alert Pain management: pain level controlled Vital Signs Assessment: post-procedure vital signs reviewed and stable Respiratory status: spontaneous breathing, nonlabored ventilation, respiratory function stable and patient connected to nasal cannula oxygen Cardiovascular status: stable and blood pressure returned to baseline Postop Assessment: no apparent nausea or vomiting Anesthetic complications: no   No notable events documented.  Last Vitals:  Vitals:   10/16/23 1550 10/16/23 1600  BP: (!) 157/106 (!) 163/109  Pulse: 89 80  Resp: 15 11  Temp:    SpO2: 99% 100%    Last Pain:  Vitals:   10/16/23 1600  TempSrc:   PainSc: 0-No pain                 Mariann Barter

## 2023-10-18 LAB — SURGICAL PATHOLOGY

## 2023-10-19 ENCOUNTER — Encounter (HOSPITAL_COMMUNITY): Payer: Self-pay | Admitting: Gastroenterology

## 2023-10-19 LAB — CYTOLOGY - NON PAP

## 2023-10-20 ENCOUNTER — Encounter: Payer: Self-pay | Admitting: Gastroenterology

## 2023-10-23 ENCOUNTER — Other Ambulatory Visit (HOSPITAL_COMMUNITY): Payer: Self-pay

## 2023-10-24 ENCOUNTER — Telehealth: Payer: Self-pay | Admitting: Gastroenterology

## 2023-10-24 ENCOUNTER — Encounter: Payer: Self-pay | Admitting: Gastroenterology

## 2023-10-24 NOTE — Telephone Encounter (Signed)
Patient called to get lab results.

## 2023-10-24 NOTE — Telephone Encounter (Signed)
Left message on machine to call back    Dear [name],   I am writing to inform you that the biopsies taken during your recent endoscopic examination showed:     FINAL MICROSCOPIC DIAGNOSIS:  A. GASTRIC BIOPSY:  Reactive gastropathy with minimal chronic gastritis  Negative for H. pylori, intestinal metaplasia, dysplasia and carcinoma    Specimen Submitted:  A. PANCREATIC TAIL LESION, FINE NEEDLE ASPIRATION:  FINAL MICROSCOPIC DIAGNOSIS:  - Atypical cells present  SPECIMEN ADEQUACY:  Satisfactory but limited for evaluation, scant cellularity  DIAGNOSTIC COMMENTS:  The aspirate is very sparsely cellular.  There is proteinaceous and  degenerating cellular debris compatible with cyst contents and there are  scattered rare atypical cells with irregular nuclei.  The cells are  present within rare loosely cohesive groups with numerous associated  stripped nuclei.  Given the cellularity the significance of this atypia  is uncertain.      What does this all mean? The gastric biopsy showed reactive changes known as gastropathy and inflammation known as gastritis.  No H. pylori infection or precancerous changes were noted.   The cystic lesion noted in the pancreas that we sampled and sent off for fluid analysis (hopefully we were able to get enough since it was such a small cyst) has not returned from the [place]of-state lab as of yet.  You will be updated when those results return.  The biopsies that were taken after the cyst had been decompressed returned showing evidence of some atypical cells and proteinaceous debris most consistent with cyst contents.  Rare atypical cells were noted but definitive cancer or precancer changes cannot be discerned at this point in time.   As I had discussed with you the results were more consistent with a cyst like area but the potential of other etiologies needed to be considered.   Lets see if we end up getting fluid analysis back that is helpful to  discern whether this is a premalignant or inflammatory cyst.   After that returns, we will decide what further imaging for follow-up/surveillance of this will be required versus if additional endoscopic ultrasound or referral to surgery may need to be considered.   We should get those results hopefully in the next week and a half.   Please call us at [phone] if you have persistent problems or have questions about your condition that have not been fully answered at this time.   Sincerely,   Lemar Lofty., MD

## 2023-10-24 NOTE — Progress Notes (Signed)
[facility] pancreatic fluid analysis  CEA <0.2 ng/mL Amylase 552 units/L Glucose 102 mg/dL  Cytology -abundant extracellular mucin without epithelial elements. 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  With both the CEA and glucose not pointing towards a mucinous cyst, likelihood of premalignancy potential is much less.  The atypical cells that were noted however on the needle biopsies are such that I think we will need to do some follow-up imaging before we decide the next steps in our evaluation and treatment, before we repeat an endoscopic ultrasound again in particular another round of biopsies.  I will discuss your case in a multidisciplinary conference as well in case there are other thoughts from my colleagues.  The current recommendation will be for a pancreas protocol CT abdomen to be performed in 3 to 4 months.  I will also recommend that we undergo a CA 19-9 and chromogranin A blood test markers as well as repeat CMP.  If after [facility] discussion, there is a change in the plan of action, we will update her.  I left a voicemail for the patient but could not reach her.  Hopefully she will call back.  I will have the results scanned in the chart and also that they are mailed to her for her records.   Corliss Parish, MD [facility] # [phone]

## 2023-11-01 ENCOUNTER — Encounter: Payer: Self-pay | Admitting: Gastroenterology

## 2023-11-01 ENCOUNTER — Telehealth: Payer: Self-pay | Admitting: Gastroenterology

## 2023-11-01 NOTE — Telephone Encounter (Signed)
PT is calling to discuss lab results.

## 2023-11-01 NOTE — Telephone Encounter (Signed)
See results note. 

## 2023-11-01 NOTE — Progress Notes (Signed)
Patient's case discussed at [facility] today. Reviewed pathology and radiology with our pancreaticobiliary surgeons as well. The previous plan that we had discussed in regards to repeat imaging (please see documentation remains the consensus from the [facility] group.  Plan for CT abdomen pancreas protocol in 4 months. Patient before that scan or around that time should have a CA 19-9 and a chromogranin A blood test performed.  Will let the patient and her primary care provider know about the final discussion from [facility] discussion.  Corliss Parish, MD [facility] # [phone]

## 2023-11-01 NOTE — Progress Notes (Signed)
Left message on machine to call back  

## 2023-11-01 NOTE — Progress Notes (Signed)
Staff message sent to myself to call pt in 4 months  Left message on machine to call back

## 2023-11-03 ENCOUNTER — Other Ambulatory Visit: Payer: Self-pay | Admitting: *Deleted

## 2023-11-03 NOTE — Progress Notes (Signed)
The proposed treatment discussed in conference is for discussion purpose only and is not a binding recommendation.  The patients have not been physically examined, or presented with their treatment options.  Therefore, final treatment plans cannot be decided.  

## 2023-11-03 NOTE — Progress Notes (Signed)
The patient has been notified of this information and all questions answered.

## 2023-11-03 NOTE — Telephone Encounter (Signed)
Patient is returning your call and stated she would like to speak to patty. Please advise.

## 2023-11-03 NOTE — Progress Notes (Signed)
Left message on machine to call back  

## 2023-11-03 NOTE — Telephone Encounter (Signed)
See alternate phone note  

## 2023-11-14 ENCOUNTER — Other Ambulatory Visit (HOSPITAL_COMMUNITY): Payer: Self-pay

## 2023-12-06 ENCOUNTER — Encounter (HOSPITAL_BASED_OUTPATIENT_CLINIC_OR_DEPARTMENT_OTHER): Payer: Medicare Other | Admitting: Cardiovascular Disease

## 2023-12-11 ENCOUNTER — Ambulatory Visit (INDEPENDENT_AMBULATORY_CARE_PROVIDER_SITE_OTHER): Payer: Medicare Other | Admitting: Cardiovascular Disease

## 2023-12-11 ENCOUNTER — Encounter (HOSPITAL_BASED_OUTPATIENT_CLINIC_OR_DEPARTMENT_OTHER): Payer: Self-pay | Admitting: Cardiovascular Disease

## 2023-12-11 VITALS — BP 154/94 | HR 78 | Ht 63.0 in | Wt 222.9 lb

## 2023-12-11 DIAGNOSIS — I10 Essential (primary) hypertension: Secondary | ICD-10-CM | POA: Diagnosis not present

## 2023-12-11 DIAGNOSIS — E66812 Obesity, class 2: Secondary | ICD-10-CM | POA: Diagnosis not present

## 2023-12-11 DIAGNOSIS — E781 Pure hyperglyceridemia: Secondary | ICD-10-CM

## 2023-12-11 DIAGNOSIS — Z6839 Body mass index (BMI) 39.0-39.9, adult: Secondary | ICD-10-CM | POA: Diagnosis not present

## 2023-12-11 DIAGNOSIS — I63411 Cerebral infarction due to embolism of right middle cerebral artery: Secondary | ICD-10-CM | POA: Diagnosis not present

## 2023-12-11 DIAGNOSIS — Z5181 Encounter for therapeutic drug level monitoring: Secondary | ICD-10-CM | POA: Diagnosis not present

## 2023-12-11 DIAGNOSIS — E6609 Other obesity due to excess calories: Secondary | ICD-10-CM

## 2023-12-11 MED ORDER — OZEMPIC (0.25 OR 0.5 MG/DOSE) 2 MG/1.5ML ~~LOC~~ SOPN: PEN_INJECTOR | SUBCUTANEOUS | 1 refills | Status: AC

## 2023-12-11 MED ORDER — HYDRALAZINE HCL 50 MG PO TABS: 50.0000 mg | ORAL_TABLET | Freq: Two times a day (BID) | ORAL | 3 refills | Status: DC

## 2023-12-11 MED ORDER — OZEMPIC (1 MG/DOSE) 4 MG/3ML ~~LOC~~ SOPN: PEN_INJECTOR | SUBCUTANEOUS | 5 refills | Status: DC

## 2023-12-11 NOTE — Patient Instructions (Signed)
Medication Instructions:  START HYDRALAZINE 50 MG TWICE A DAY   OZEMPIC CHANGES BELOW  INCREASE OZEMPIC TO 0.5 MG WEEKLY FOR 4 WEEKS THEN INCREASE OZEMPIC TO 1 MG WEEKLY    Labwork: TODAY ON 3RD FLOOR   Testing/Procedures: NONE  Follow-Up: 1 TO 2 MONTHS IN ADV HTN CLINIC WITH EITHER CAITLIN W NP, DR [place], OR PHARM D   Any Other Special Instructions Will Be Listed Below (If Applicable). MONITOR YOUR BLOOD PRESSURE AND BRING YOUR READINGS TO YOUR FOLLOW UP APPOINTMENT   If you need a refill on your cardiac medications before your next appointment, please call your pharmacy.

## 2023-12-11 NOTE — Progress Notes (Signed)
[facility] Initial Assessment:    Date:  12/11/2023   ID:  Sharon Chan, DOB 01/24/1976, MRN [id number]  PCP:  Ivonne Andrew, NP  Cardiologist:  None  Nephrologist:  Referring MD: Ivonne Andrew, NP   CC: Hypertension  History of Present Illness:    Sharon Chan is a 48 y.o. female with a hx of hypertension, CVA, hyperlipidemia, diabetes, peripheral neuropathy, polycystic kidney disease, sickle cell trait, vitamin B12 deficiency, here for follow up.  She was first seen in the [facility] 05/2023.   She was seen by her PCP 03/13/2023 and her blood pressure was 162/99 on a regimen of amlodipine 5 mg daily, valsartan-HCTZ 320-12.5 mg daily. Home blood pressures were elevated as well. She complained of chronic headaches, some LE edema. Amlodipine was increased to 10 mg daily. Also noted having multiple falls with her high blood pressure. Due to concern for stroke, brain MRI was ordered and was negative. She was seen by their pharmacist 03/16/2023 and had blood pressures in the 170-180s with validated home BP cuff. She was started on spironolactone 25 mg daily and referred to the [facility]. Atorvastatin was also increased to 80 mg daily. She had an echo 08/2022 that revealed LVEF 60-65% and mild LVH. She had a sleep study that was negative for sleep apnea.    At her initial visit she was dealing with caregiver stress.  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  She reported episodes of chest pain and was referred for coronary CTA which revealed a coronary calcium score of 0 and no CAD.  Spironolactone was increased.  She has not followed up with our office since that time.  Sharon Chan presents with persistent hypertension despite being on multiple medications including amlodipine, carvedilol, spironolactone, and valsartan hydrochlorothiazide. She reports blood pressure readings frequently in the 150s over 90s, with occasional  readings in the 130s. The patient has been adhering to a regular exercise routine, including swimming three times a week and daily walks of about four miles. She also reports fasting for 420 days, consuming only one meal a day and two boosts for nutrition. Despite these efforts, the patient's weight has been increasing.  She has been on Ozempic for approximately four months at a dose of 0.25, which she tolerates well. She expresses frustration with her weight gain, especially given her healthy eating habits. She also mentions that her blood pressure tends to elevate during her menstrual cycle, and despite the cycle ending a week ago, her blood pressure remains high. She expresses concern about the risk of another stroke due to her elevated blood pressure.  Sharon Chan denies any other health issues and reports feeling well overall. She does not snore and has had a sleep study in the past. She is also diligent about watching her salt intake. Despite her busy caregiving schedule, the patient tries to take her medications at the same time she administers her spouse's medications, although she admits to occasionally forgetting due to her busy schedule.     Previous antihypertensives: N/a  Past Medical History:  Diagnosis Date   Anxiety    Chest pain of uncertain etiology 06/01/2023   CVA (cerebral vascular accident) (HCC)    Depression    DM type 2 (diabetes mellitus, type 2) (HCC)    HLD (hyperlipidemia)    HTN (hypertension)    Joint pain    Migraines    Neck pain    Obese  Palpitations 06/01/2023   Polycystic disease, ovaries    Right sided weakness    Seizures (HCC)    Shoulder pain    Sickle cell trait (HCC)    Thyroid nodule    Vitamin D deficiency 01/2020    Past Surgical History:  Procedure Laterality Date   BIOPSY  10/16/2023   Procedure: BIOPSY;  Surgeon: Lemar Lofty., MD;  Location: [facility];  Service: Gastroenterology;;   ESOPHAGOGASTRODUODENOSCOPY (EGD)  WITH PROPOFOL N/A 10/16/2023   Procedure: ESOPHAGOGASTRODUODENOSCOPY (EGD) WITH PROPOFOL;  Surgeon: Lemar Lofty., MD;  Location: [facility];  Service: Gastroenterology;  Laterality: N/A;   EUS N/A 10/16/2023   Procedure: UPPER ENDOSCOPIC ULTRASOUND (EUS) RADIAL;  Surgeon: Lemar Lofty., MD;  Location: [facility];  Service: Gastroenterology;  Laterality: N/A;   FINE NEEDLE ASPIRATION N/A 10/16/2023   Procedure: FINE NEEDLE ASPIRATION (FNA) LINEAR;  Surgeon: Lemar Lofty., MD;  Location: [facility];  Service: Gastroenterology;  Laterality: N/A;   LAPAROSCOPIC CHOLECYSTECTOMY      Current Medications: Current Meds  Medication Sig   albuterol (VENTOLIN HFA) 108 (90 Base) MCG/ACT inhaler Inhale 2 puffs into the lungs every 6 (six) hours as needed for wheezing or shortness of breath.   amLODipine (NORVASC) 10 MG tablet Take 1 tablet (10 mg total) by mouth every evening.   aspirin EC 81 MG tablet Take 1 tablet (81 mg total) by mouth in the morning.   atorvastatin (LIPITOR) 80 MG tablet Take 1 tablet (80 mg total) by mouth daily.   blood glucose meter kit and supplies KIT Dispense based on patient and insurance preference. Use up to four times daily as directed. (FOR ICD-9 250.00, 250.01).   carvedilol (COREG) 6.25 MG tablet Take 1 tablet (6.25 mg total) by mouth 2 (two) times daily.   cyclobenzaprine (FLEXERIL) 5 MG tablet TAKE ONE TABLET BY MOUTH THREE TIMES DAILY AS NEEDED (VIAL)   diphenhydrAMINE (BENADRYL) 25 MG tablet Take 25 mg by mouth every 6 (six) hours as needed for allergies.   divalproex (DEPAKOTE ER) 500 MG 24 hr tablet Take 1 tablet (500 mg total) by mouth daily.   eszopiclone (LUNESTA) 1 MG TABS tablet Take 1 tablet (1 mg total) by mouth at bedtime as needed for sleep. Take immediately before bedtime   Evolocumab (REPATHA SURECLICK) 140 MG/ML SOAJ Inject 140 mg into the skin every 14 days.   Galcanezumab-gnlm (EMGALITY) 120 MG/ML SOAJ Inject 1 Pen into  the skin every 30 (thirty) days.   hydrALAZINE (APRESOLINE) 50 MG tablet Take 1 tablet (50 mg total) by mouth 2 (two) times daily.   nortriptyline (PAMELOR) 25 MG capsule Take 3 capsules (75 mg total) by mouth at bedtime.   OneTouch Delica Lancets 33G MISC TES 2 (TWO) TIMES DAILY.   [START ON 01/08/2024] Semaglutide, 1 MG/DOSE, (OZEMPIC, 1 MG/DOSE,) 4 MG/3ML SOPN 1 MG WEEKLY WHEN YOU FINISH 0.5 MG WEEKLY   Semaglutide,0.25 or 0.5MG /DOS, (OZEMPIC, 0.25 OR 0.5 MG/DOSE,) 2 MG/1.5ML SOPN INJECT 0.5 MG WEEKLY FOR 4 WEEKS THEN INCREASE TO 0.1 MG WEEKLY   spironolactone (ALDACTONE) 50 MG tablet Take 1 tablet (50 mg total) by mouth daily.   topiramate (TOPAMAX) 100 MG tablet Take 1 tablet (100 mg total) by mouth in the morning AND 2 tablets (200 mg total) every evening.   valsartan-hydrochlorothiazide (DIOVAN-HCT) 320-25 MG tablet Take 1 tablet by mouth daily.   [DISCONTINUED] Semaglutide,0.25 or 0.5MG /DOS, (OZEMPIC, 0.25 OR 0.5 MG/DOSE,) 2 MG/3ML SOPN INJECT 0.25 MG WEEKLY FOR 4 WEEKS, THEN  INCREASE TO 0.5 MG WEEKLY     Allergies:   Penicillins, Lisinopril, Latex, and Other   Social History   Socioeconomic History   Marital status: Single    Spouse name: Not on file   Number of children: 0   Years of education: Not on file   Highest education level: Not on file  Occupational History   Not on file  Tobacco Use   Smoking status: Never   Smokeless tobacco: Never  Vaping Use   Vaping status: Never Used  Substance and Sexual Activity   Alcohol use: No   Drug use: No   Sexual activity: Not Currently  Other Topics Concern   Not on file  Social History Narrative   Lives at home, mom currently staying with her   Left-handed   Caffeine: occasional decaf coffee or raspberry tea   One story home   No feeling on right side, but fully functioning   Social Drivers of Corporate investment banker Strain: High Risk (02/12/2023)   Overall Financial Resource Strain (CARDIA)    Difficulty of Paying  Living Expenses: Hard  Food Insecurity: Food Insecurity Present (02/12/2023)   Hunger Vital Sign    Worried About Running Out of Food in the Last Year: Often true    Ran Out of Food in the Last Year: Often true  Transportation Needs: Patient Declined (02/12/2023)   PRAPARE - Administrator, Civil Service (Medical): Patient declined    Lack of Transportation (Non-Medical): Patient declined  Physical Activity: Unknown (02/12/2023)   Exercise Vital Sign    Days of Exercise per Week: Patient declined    Minutes of Exercise per Session: 20 min  Recent Concern: Physical Activity - Insufficiently Active (12/22/2022)   Exercise Vital Sign    Days of Exercise per Week: 3 days    Minutes of Exercise per Session: 20 min  Stress: No Stress Concern Present (02/12/2023)   [employer] of Occupational Health - Occupational Stress Questionnaire    Feeling of Stress : Only a little  Social Connections: Unknown (02/12/2023)   Social Connection and Isolation Panel [NHANES]    Frequency of Communication with Friends and Family: Once a week    Frequency of Social Gatherings with Friends and Family: Patient declined    Attends Religious Services: More than 4 times per year    Active Member of [employer] or Organizations: Yes    Attends Banker Meetings: Never    Marital Status: Never married     Family History: The patient's family history includes Breast cancer in her maternal grandmother and paternal grandmother; Colon polyps in her mother; Diabetes in her father and mother; Heart attack (age of onset: 85) in her mother; Hyperlipidemia in her mother; Hypertension in her mother and sister; Post-traumatic stress disorder in her father; Stomach cancer in her maternal aunt; Thyroid cancer in her paternal grandmother. There is no history of Colon cancer, Esophageal cancer, or Pancreatic cancer.  ROS:   Please see the history of present illness.    (+) Stress (+) Left-sided chest pain (+)  Shortness of breath (+) Diaphoresis (+) Dizziness (+) Palpitations (+) Residual right-sided numbness (+) Chronic insomnia and fatigue (+) Headaches (+) Brain fog (+) Loss of appetite All other systems reviewed and are negative.  EKGs/Labs/Other Studies Reviewed:    Echo addendum with bubble study 08/12/2022:  1. Normal LV systolic function with visual EF 60-65%. Left ventricle  cavity is normal in size. Normal global wall  motion. Normal diastolic filling pattern, normal LAP. Mild left  ventricular hypertrophy.  2. Agitated saline contrast was injected into a peripheral vein. No  intracardiac shunt was seen. A patient  foramen ovale was not seen.  3. Mild (Grade I) aortic regurgitation.  4. The aortic root is normal. Mildly dilated proximal ascending aorta  40mm.  5. No prior study for comparison.   Monitor 07/2022:   Patient had a minimum heart rate of 51 bpm, maximum heart rate of 176 bpm, and average heart rate of 71 bpm.   Predominant underlying rhythm was sinus rhythm.   Isolated PACs were rare (<1.0%).   Isolated PVCs were rare (<1.0%).   No evidence of significant heart block.   Triggered and diary events associated with sinus rhythm and sinus tachycardia.   No evidence of cardiac syncope.   EKG:  EKG is personally reviewed. 06/01/2023: Sinus rhythm. Rate 75 bpm.  Recent Labs: 03/13/2023: Hemoglobin 12.6; Platelets 306 05/10/2023: ALT 11; BUN 10; Creatinine, Ser 0.94; Potassium 3.8; Sodium 143   Recent Lipid Panel    Component Value Date/Time   CHOL 191 03/30/2023 1046   TRIG 65 03/30/2023 1046   HDL 46 03/30/2023 1046   CHOLHDL 4.2 03/30/2023 1046   CHOLHDL 2.7 07/25/2017 1130   VLDL 27 02/26/2017 0341   LDLCALC 133 (H) 03/30/2023 1046   LDLCALC 70 07/25/2017 1130   LDLDIRECT 122 (H) 03/30/2023 1046    Physical Exam:    VS:  BP (!) 154/94   Pulse 78   Ht 5\' 3"  (1.6 m)   Wt 222 lb 14.4 oz (101.1 kg)   SpO2 99%   BMI 39.48 kg/m  , BMI Body mass  index is 39.48 kg/m. GENERAL:  Well appearing HEENT: Pupils equal round and reactive, fundi not visualized, oral mucosa unremarkable NECK:  No jugular venous distention, waveform within normal limits, carotid upstroke brisk and symmetric, no bruits, no thyromegaly LUNGS:  Clear to auscultation bilaterally HEART:  RRR.  PMI not displaced or sustained, S1 and S2 within normal limits, no S3, no S4, no clicks, no rubs, no murmurs ABD:  Flat, positive bowel sounds normal in frequency in pitch, no bruits, no rebound, no guarding, no midline pulsatile mass, no hepatomegaly, no splenomegaly EXT:  2 plus pulses throughout, no edema, no cyanosis, no clubbing SKIN:  No rashes, no nodules NEURO:  Cranial nerves II through XII grossly intact, motor grossly intact throughout PSYCH:  Cognitively intact, oriented to person place and time   ASSESSMENT/PLAN:    # Hypertension Persistent elevated blood pressure despite current medication regimen (amlodipine 10mg , carvedilol, spironolactone, valsartan hydrochlorothiazide). Noted potential influence of menstrual cycle on blood pressure. -Add Hydralazine 50mg  twice daily. -Order labs including renin, aldosterone, cortisol, catecholamines, metanephrines to assess for potential hormonal imbalances contributing to hypertension. -Request patient to track blood pressures at home and follow up in 1 month. - Consider for Renal Denervation when available  # Obesity: Despite efforts with diet, exercise, and Ozempic 0.25mg , weight remains elevated. -Increase Ozempic to 0.5mg  once a week for 4 weeks, then increase to 1mg . -Continue current diet and exercise regimen.   # CVA:  BP control as above.  Continue aspirin and atorvastatin.   # Hyperlipidemia: Check fasting lipids/CMP.  Continue atorvastatin.    General Health Maintenance -Order lipid panel and comprehensive metabolic panel to assess overall health status.      Screening for Secondary Hypertension:      06/01/2023   10:06 AM  Causes  Drugs/Herbals Screened     - Comments no EtoH, caffeine, tobacco.  Limits sodium.  Renovascular HTN Screened     - Comments Check renal artery Dopplers  Sleep Apnea Screened     - Comments no OSA on sleep study  Thyroid Disease Screened     - Comments check TSH  Hyperaldosteronism Screened     - Comments Check renin and aldo  Pheochromocytoma Screened     - Comments check catecholamines and metanephries  Cushing's Syndrome Screened     - Comments AM cortisol  Hyperparathyroidism Screened  Coarctation of the Aorta Screened     - Comments BP symmetric  Compliance Screened    Relevant Labs/Studies:    Latest Ref Rng & Units 05/10/2023    9:52 AM 03/30/2023   10:46 AM 03/13/2023   12:07 PM  Basic Labs  Sodium 134 - 144 mmol/L 143  143  142   Potassium 3.5 - 5.2 mmol/L 3.8  3.7  4.1   Creatinine 0.57 - 1.00 mg/dL 4.09  8.11  9.14        Latest Ref Rng & Units 02/03/2020    2:12 PM 12/03/2018    2:16 PM  Thyroid   TSH 0.450 - 4.500 uIU/mL 0.747  0.57    Disposition:    F/u in 1-2 months   Medication Adjustments/Labs and Tests Ordered: Current medicines are reviewed at length with the patient today.  Concerns regarding medicines are outlined above.   Orders Placed This Encounter  Procedures   TSH   Cortisol   Lipid panel   Aldosterone + renin activity w/ ratio   Metanephrines, plasma   Catecholamines, fractionated, plasma   Comprehensive metabolic panel   Meds ordered this encounter  Medications   Semaglutide,0.25 or 0.5MG /DOS, (OZEMPIC, 0.25 OR 0.5 MG/DOSE,) 2 MG/1.5ML SOPN    Sig: INJECT 0.5 MG WEEKLY FOR 4 WEEKS THEN INCREASE TO 0.1 MG WEEKLY    Dispense:  1 mL    Refill:  1    NEW DOSE, D/C PREVIOUS RX   Semaglutide, 1 MG/DOSE, (OZEMPIC, 1 MG/DOSE,) 4 MG/3ML SOPN    Sig: 1 MG WEEKLY WHEN YOU FINISH 0.5 MG WEEKLY    Dispense:  1 mL    Refill:  5   hydrALAZINE (APRESOLINE) 50 MG tablet    Sig: Take 1 tablet (50 mg total)  by mouth 2 (two) times daily.    Dispense:  180 tablet    Refill:  3    Signed, Chilton Si, MD  12/11/2023 8:55 AM    [facility]

## 2023-12-19 ENCOUNTER — Other Ambulatory Visit (HOSPITAL_COMMUNITY): Payer: Self-pay

## 2023-12-20 NOTE — Telephone Encounter (Addendum)
 ERROR

## 2023-12-22 ENCOUNTER — Other Ambulatory Visit (HOSPITAL_COMMUNITY): Payer: Self-pay

## 2023-12-22 ENCOUNTER — Encounter: Payer: Self-pay | Admitting: Gastroenterology

## 2023-12-22 ENCOUNTER — Telehealth: Payer: Self-pay

## 2023-12-22 LAB — COMPREHENSIVE METABOLIC PANEL
ALT: 10 [IU]/L (ref 0–32)
AST: 14 [IU]/L (ref 0–40)
Albumin: 3.9 g/dL (ref 3.9–4.9)
Alkaline Phosphatase: 94 [IU]/L (ref 44–121)
BUN/Creatinine Ratio: 12 (ref 9–23)
BUN: 10 mg/dL (ref 6–24)
Bilirubin Total: 0.3 mg/dL (ref 0.0–1.2)
CO2: 25 mmol/L (ref 20–29)
Calcium: 9.1 mg/dL (ref 8.7–10.2)
Chloride: 103 mmol/L (ref 96–106)
Creatinine, Ser: 0.81 mg/dL (ref 0.57–1.00)
Globulin, Total: 2.6 g/dL (ref 1.5–4.5)
Glucose: 109 mg/dL — ABNORMAL HIGH (ref 70–99)
Potassium: 3.7 mmol/L (ref 3.5–5.2)
Sodium: 142 mmol/L (ref 134–144)
Total Protein: 6.5 g/dL (ref 6.0–8.5)
eGFR: 88 mL/min/{1.73_m2} (ref >59)

## 2023-12-22 LAB — ALDOSTERONE + RENIN ACTIVITY W/ RATIO
Aldos/Renin Ratio: 22 (ref 0.0–30.0)
Aldosterone: 7.8 ng/dL (ref 0.0–30.0)
Renin Activity, Plasma: 0.354 ng/mL/h (ref 0.167–5.380)

## 2023-12-22 LAB — CORTISOL: Cortisol: 11.2 ug/dL (ref 6.2–19.4)

## 2023-12-22 LAB — LIPID PANEL
Chol/HDL Ratio: 4.1 {ratio} (ref 0.0–4.4)
Cholesterol, Total: 215 mg/dL — ABNORMAL HIGH (ref 100–199)
HDL: 52 mg/dL (ref >39)
LDL Chol Calc (NIH): 146 mg/dL — ABNORMAL HIGH (ref 0–99)
Triglycerides: 94 mg/dL (ref 0–149)
VLDL Cholesterol Cal: 17 mg/dL (ref 5–40)

## 2023-12-22 LAB — METANEPHRINES, PLASMA
Metanephrine, Free: 39.1 pg/mL (ref 0.0–88.0)
Normetanephrine, Free: 65.3 pg/mL (ref 0.0–218.9)

## 2023-12-22 LAB — TSH: TSH: 0.705 u[IU]/mL (ref 0.450–4.500)

## 2023-12-22 LAB — CATECHOLAMINES, FRACTIONATED, PLASMA
Dopamine: <30 pg/mL (ref 0–48)
Epinephrine: 24 pg/mL (ref 0–62)
Norepinephrine: 458 pg/mL (ref 0–874)

## 2023-12-22 NOTE — Telephone Encounter (Signed)
 PA request cancelled out in [facility] stating patient had another primary payer. Called and spoke to patient. She reached out to insurance to have the other plan removed. Should take into effect 12/25/23. Will retry PA then.

## 2023-12-25 ENCOUNTER — Other Ambulatory Visit (HOSPITAL_COMMUNITY): Payer: Self-pay

## 2023-12-25 NOTE — Telephone Encounter (Addendum)
Pharmacy Patient Advocate Encounter   Received notification from CoverMyMeds that prior authorization for [facility] is required/requested.   Insurance verification completed.   The patient is insured through [facility] .   Per test claim: PA required; PA submitted to above mentioned insurance via CoverMyMeds Key/confirmation #/EOC [id number] Status is pending

## 2024-01-02 ENCOUNTER — Other Ambulatory Visit (HOSPITAL_COMMUNITY): Payer: Self-pay

## 2024-01-02 NOTE — Telephone Encounter (Signed)
Plan keeps denying PA saying patient has another active payer. Insurance told patient they would remove the other payer after they spoke to plan on 12/25/23. However, payer is still an issue. Test bill to other plan says [employer] is the primary payer. Called [employer] today and expressed frustration that they did not tell patient when she called in that she would need to call medicare to take off the plan as well. I can open a benefits investigation for her, but that can take up to 30 days to complete. Patient has the option to call and request an expedited investigation. Made plan aware that they have caused unnecessary delays in the patient's care. Also called patient back and LM to instruct them on removing the active plan at [phone].

## 2024-01-03 ENCOUNTER — Other Ambulatory Visit (HOSPITAL_COMMUNITY): Payer: Self-pay

## 2024-01-03 NOTE — Telephone Encounter (Signed)
Patient called back and said she did confirm the active Medicare plan and that she was unaware she had it. Patient would like to keep the Medicare active and utilize that as primary. Per test claim Ozempic is covered on Medicare plan without a PA. Pt is aware.

## 2024-01-04 ENCOUNTER — Ambulatory Visit (INDEPENDENT_AMBULATORY_CARE_PROVIDER_SITE_OTHER): Payer: Medicare Other

## 2024-01-04 VITALS — BP 130/70 | Ht 63.0 in | Wt 220.0 lb

## 2024-01-04 DIAGNOSIS — Z0000 Encounter for general adult medical examination without abnormal findings: Secondary | ICD-10-CM

## 2024-01-04 DIAGNOSIS — Z1159 Encounter for screening for other viral diseases: Secondary | ICD-10-CM

## 2024-01-04 DIAGNOSIS — Z1231 Encounter for screening mammogram for malignant neoplasm of breast: Secondary | ICD-10-CM

## 2024-01-04 NOTE — Progress Notes (Signed)
Subjective:   Sharon Chan is a 48 y.o. who presents for a Medicare Wellness preventive visit.  Visit Complete: Virtual I connected with  Sharon Chan on 01/04/24 by a video and audio enabled telemedicine application and verified that I am speaking with the correct person using two identifiers.  Patient Location: Home  Provider Location: Home Office  I discussed the limitations of evaluation and management by telemedicine. The patient expressed understanding and agreed to proceed.  Vital Signs: Because this visit was a virtual/telehealth visit, some criteria may be missing or patient reported. Any vitals not documented were not able to be obtained and vitals that have been documented are patient reported.  AWV Questionnaire: Yes: Patient Medicare AWV questionnaire was completed by the patient on 01/04/2024; I have confirmed that all information answered by patient is correct and no changes since this date.  Cardiac Risk Factors include: diabetes mellitus;dyslipidemia;hypertension;obesity (BMI >30kg/m2) (seizures, sickle cell disease)     Objective:    Today's Vitals   01/04/24 1102  BP: 130/70  Weight: 220 lb (99.8 kg)  Height: 5\' 3"  (1.6 m)   Body mass index is 38.97 kg/m.     01/04/2024   11:00 AM 10/16/2023   12:46 PM 05/08/2023    2:35 PM 12/22/2022    2:56 PM 09/27/2022    3:31 PM 09/05/2022    4:00 PM 07/27/2022    8:34 PM  Advanced Directives  Does Patient Have a Medical Advance Directive? No Yes Yes No No No No  Type of Advance Directive  Living will Living will      Would patient like information on creating a medical advance directive? Yes (MAU/Ambulatory/Procedural Areas - Information given)   No - Patient declined  No - Patient declined Yes (MAU/Ambulatory/Procedural Areas - Information given)    Current Medications (verified) Outpatient Encounter Medications as of 01/04/2024  Medication Sig   albuterol (VENTOLIN HFA) 108 (90 Base) MCG/ACT  inhaler Inhale 2 puffs into the lungs every 6 (six) hours as needed for wheezing or shortness of breath.   amLODipine (NORVASC) 10 MG tablet Take 1 tablet (10 mg total) by mouth every evening.   aspirin EC 81 MG tablet Take 1 tablet (81 mg total) by mouth in the morning.   atorvastatin (LIPITOR) 80 MG tablet Take 1 tablet (80 mg total) by mouth daily.   blood glucose meter kit and supplies KIT Dispense based on patient and insurance preference. Use up to four times daily as directed. (FOR ICD-9 250.00, 250.01).   carvedilol (COREG) 6.25 MG tablet Take 1 tablet (6.25 mg total) by mouth 2 (two) times daily.   cyclobenzaprine (FLEXERIL) 5 MG tablet TAKE ONE TABLET BY MOUTH THREE TIMES DAILY AS NEEDED (VIAL)   diphenhydrAMINE (BENADRYL) 25 MG tablet Take 25 mg by mouth every 6 (six) hours as needed for allergies.   divalproex (DEPAKOTE ER) 500 MG 24 hr tablet Take 1 tablet (500 mg total) by mouth daily.   eszopiclone (LUNESTA) 1 MG TABS tablet Take 1 tablet (1 mg total) by mouth at bedtime as needed for sleep. Take immediately before bedtime   Evolocumab (REPATHA SURECLICK) 140 MG/ML SOAJ Inject 140 mg into the skin every 14 days.   Galcanezumab-gnlm (EMGALITY) 120 MG/ML SOAJ Inject 1 Pen into the skin every 30 (thirty) days.   hydrALAZINE (APRESOLINE) 50 MG tablet Take 1 tablet (50 mg total) by mouth 2 (two) times daily.   nortriptyline (PAMELOR) 25 MG capsule Take 3 capsules (75 mg  total) by mouth at bedtime.   OneTouch Delica Lancets 33G MISC TES 2 (TWO) TIMES DAILY.   [START ON 01/08/2024] Semaglutide, 1 MG/DOSE, (OZEMPIC, 1 MG/DOSE,) 4 MG/3ML SOPN 1 MG WEEKLY WHEN YOU FINISH 0.5 MG WEEKLY   spironolactone (ALDACTONE) 50 MG tablet Take 1 tablet (50 mg total) by mouth daily.   topiramate (TOPAMAX) 100 MG tablet Take 1 tablet (100 mg total) by mouth in the morning AND 2 tablets (200 mg total) every evening.   valsartan-hydrochlorothiazide (DIOVAN-HCT) 320-25 MG tablet Take 1 tablet by mouth daily.    Semaglutide,0.25 or 0.5MG /DOS, (OZEMPIC, 0.25 OR 0.5 MG/DOSE,) 2 MG/1.5ML SOPN INJECT 0.5 MG WEEKLY FOR 4 WEEKS THEN INCREASE TO 0.1 MG WEEKLY   No facility-administered encounter medications on file as of 01/04/2024.    Allergies (verified) Penicillins, Lisinopril, Latex, and Other   History: Past Medical History:  Diagnosis Date   Anxiety    Chest pain of uncertain etiology 06/01/2023   CVA (cerebral vascular accident) (HCC)    Depression    DM type 2 (diabetes mellitus, type 2) (HCC)    HLD (hyperlipidemia)    HTN (hypertension)    Joint pain    Migraines    Neck pain    Obese    Palpitations 06/01/2023   Polycystic disease, ovaries    Right sided weakness    Seizures (HCC)    Shoulder pain    Sickle cell trait (HCC)    Thyroid nodule    Vitamin D deficiency 01/2020   Past Surgical History:  Procedure Laterality Date   BIOPSY  10/16/2023   Procedure: BIOPSY;  Surgeon: Lemar Lofty., MD;  Location: [facility];  Service: Gastroenterology;;   ESOPHAGOGASTRODUODENOSCOPY (EGD) WITH PROPOFOL N/A 10/16/2023   Procedure: ESOPHAGOGASTRODUODENOSCOPY (EGD) WITH PROPOFOL;  Surgeon: Lemar Lofty., MD;  Location: [facility];  Service: Gastroenterology;  Laterality: N/A;   EUS N/A 10/16/2023   Procedure: UPPER ENDOSCOPIC ULTRASOUND (EUS) RADIAL;  Surgeon: Lemar Lofty., MD;  Location: [facility];  Service: Gastroenterology;  Laterality: N/A;   FINE NEEDLE ASPIRATION N/A 10/16/2023   Procedure: FINE NEEDLE ASPIRATION (FNA) LINEAR;  Surgeon: Lemar Lofty., MD;  Location: [facility];  Service: Gastroenterology;  Laterality: N/A;   LAPAROSCOPIC CHOLECYSTECTOMY     Family History  Problem Relation Age of Onset   Heart attack Mother 56   Colon polyps Mother    Diabetes Mother    Hypertension Mother    Hyperlipidemia Mother    Post-traumatic stress disorder Father        committed suicide   Diabetes Father    Hypertension Sister    Stomach  cancer Maternal Aunt    Breast cancer Maternal Grandmother    Breast cancer Paternal Grandmother    Thyroid cancer Paternal Grandmother    Colon cancer Neg Hx    Esophageal cancer Neg Hx    Pancreatic cancer Neg Hx    Social History   Socioeconomic History   Marital status: Single    Spouse name: Not on file   Number of children: 0   Years of education: Not on file   Highest education level: Not on file  Occupational History   Not on file  Tobacco Use   Smoking status: Never   Smokeless tobacco: Never  Vaping Use   Vaping status: Never Used  Substance and Sexual Activity   Alcohol use: No   Drug use: No   Sexual activity: Not Currently  Other Topics Concern   Not  on file  Social History Narrative   Lives at home, mom currently staying with her   Left-handed   Caffeine: occasional decaf coffee or raspberry tea   One story home   No feeling on right side, but fully functioning   Social Drivers of Corporate investment banker Strain: High Risk (01/04/2024)   Overall Financial Resource Strain (CARDIA)    Difficulty of Paying Living Expenses: Very hard  Food Insecurity: Food Insecurity Present (01/04/2024)   Hunger Vital Sign    Worried About Running Out of Food in the Last Year: Often true    Ran Out of Food in the Last Year: Often true  Transportation Needs: Patient Declined (01/04/2024)   PRAPARE - Transportation    Lack of Transportation (Medical): Patient declined    Lack of Transportation (Non-Medical): Patient declined  Physical Activity: Sufficiently Active (01/04/2024)   Exercise Vital Sign    Days of Exercise per Week: 7 days    Minutes of Exercise per Session: 40 min  Stress: No Stress Concern Present (01/04/2024)   [employer] of Occupational Health - Occupational Stress Questionnaire    Feeling of Stress : Only a little  Social Connections: Moderately Integrated (01/04/2024)   Social Connection and Isolation Panel [NHANES]    Frequency of Communication  with Friends and Family: More than three times a week    Frequency of Social Gatherings with Friends and Family: Patient declined    Attends Religious Services: More than 4 times per year    Active Member of [employer] or Organizations: Yes    Attends Engineer, structural: More than 4 times per year    Marital Status: Never married    Tobacco Counseling Counseling given: Yes  Clinical Intake:  Pre-visit preparation completed: Yes  Pain : No/denies pain  BMI - recorded: 38.97 Nutritional Risks: None Diabetes: No  How often do you need to have someone help you when you read instructions, pamphlets, or other written materials from your doctor or pharmacy?: 1 - Never  Interpreter Needed?: No  Information entered by :: Maryjean Ka CMA   Activities of Daily Living     01/04/2024    9:34 AM  In your present state of health, do you have any difficulty performing the following activities:  Hearing? 0  Vision? 0  Difficulty concentrating or making decisions? 0  Walking or climbing stairs? 1  Comment uses cane or walker  Dressing or bathing? 0  Doing errands, shopping? 0  Preparing Food and eating ? N  Using the Toilet? N  In the past six months, have you accidently leaked urine? Y  Do you have problems with loss of bowel control? N  Managing your Medications? N  Managing your Finances? N  Housekeeping or managing your Housekeeping? N    Patient Care Team: Ivonne Andrew, NP as PCP - General (Adult Health Nurse Practitioner) Alden Hipp, RPH-CPP (Pharmacist) Van Clines, MD as Consulting Physician (Neurology) Chilton Si, MD as Attending Physician (Cardiology) Mansouraty, Netty Starring., MD as Consulting Physician (Gastroenterology) Unk Lightning, [place] as Physician Assistant (Gastroenterology) Reva Bores, MD as Consulting Physician (Obstetrics and Gynecology)  Indicate any recent Medical Services you may have received from other than [facility]  providers in the past year (date may be approximate).     Assessment:   This is a routine wellness examination for Alainah.  Hearing/Vision screen Hearing Screening - Comments:: Patient denies any hearing difficulties.   Vision Screening - Comments::  Patient is not up to date on yearly eye exams and does not currently have an eye doctor. Referral was placed last week for Dr. Dione Booze and patient has number to call and make an appt.    Goals Addressed             This Visit's Progress    Patient Stated       Lose weight       Depression Screen     01/04/2024   11:11 AM 03/17/2023   10:02 AM 02/13/2023   10:10 AM 10/18/2022    3:46 PM 03/02/2022   10:00 AM 10/18/2019    3:04 PM 01/01/2019    1:54 PM  PHQ 2/9 Scores  PHQ - 2 Score 0 0 2 0 6 0 0  PHQ- 9 Score 0  15  18      Fall Risk     01/04/2024    9:34 AM 05/08/2023    2:35 PM 03/17/2023   10:02 AM 03/13/2023   10:53 AM 02/13/2023   10:09 AM  Fall Risk   Falls in the past year? 1 1 1 1 1   Number falls in past yr: 1 1 0 1 1  Injury with Fall? 0 0 0 0   Risk for fall due to : History of fall(s);Impaired balance/gait;Impaired mobility   Other (Comment)   Follow up Education provided;Falls prevention discussed Falls evaluation completed  Education provided     MEDICARE RISK AT HOME:  Medicare Risk at Home Any stairs in or around the home?: (Patient-Rptd) Yes If so, are there any without handrails?: (Patient-Rptd) Yes Home free of loose throw rugs in walkways, pet beds, electrical cords, etc?: (Patient-Rptd) Yes Adequate lighting in your home to reduce risk of falls?: (Patient-Rptd) Yes Life alert?: (Patient-Rptd) No Use of a cane, walker or w/c?: (Patient-Rptd) Yes Grab bars in the bathroom?: (Patient-Rptd) Yes Shower chair or bench in shower?: (Patient-Rptd) Yes Elevated toilet seat or a handicapped toilet?: (Patient-Rptd) Yes  TIMED UP AND GO:  Was the test performed?  No  Cognitive Function: 6CIT completed     10/01/2018    2:01 PM  MMSE - Mini Mental State Exam  Orientation to time 5  Orientation to Place 5  Registration 3  Attention/ Calculation 5  Recall 3  Language- name 2 objects 2  Language- repeat 1  Language- follow 3 step command 3  Language- read & follow direction 1  Write a sentence 1  Copy design 1  Total score 30        01/04/2024   11:07 AM 12/22/2022    2:56 PM  6CIT Screen  What Year? 0 points 0 points  What month? 0 points 0 points  What time? 0 points 0 points  Count back from 20 0 points 0 points  Months in reverse 0 points 0 points  Repeat phrase 0 points 0 points  Total Score 0 points 0 points    Immunizations Immunization History  Administered Date(s) Administered   Influenza,inj,Quad PF,6+ Mos 09/12/2017, 10/01/2018, 11/20/2019, 10/18/2022   PFIZER(Purple Top)SARS-COV-2 Vaccination 02/06/2020, 02/25/2020    Screening Tests Health Maintenance  Topic Date Due   Pneumococcal Vaccine 14-68 Years old (1 of 2 - PCV) Never done   Hepatitis C Screening  Never done   DTaP/Tdap/Td (1 - Tdap) Never done   FOOT EXAM  01/02/2020   MAMMOGRAM  03/23/2023   INFLUENZA VACCINE  06/15/2023   COVID-19 Vaccine (3 - 2024-25 season) 07/16/2023  HEMOGLOBIN A1C  08/15/2023   Zoster Vaccines- Shingrix (1 of 2) Never done   Diabetic kidney evaluation - Urine ACR  02/13/2024   OPHTHALMOLOGY EXAM  05/22/2024   Diabetic kidney evaluation - eGFR measurement  12/10/2024   Medicare Annual Wellness (AWV)  01/03/2025   Fecal DNA (Cologuard)  11/22/2025   Cervical Cancer Screening (HPV/Pap Cotest)  03/16/2028   HIV Screening  Completed   HPV VACCINES  Aged Out    Health Maintenance  Health Maintenance Due  Topic Date Due   Pneumococcal Vaccine 6-6 Years old (1 of 2 - PCV) Never done   Hepatitis C Screening  Never done   DTaP/Tdap/Td (1 - Tdap) Never done   FOOT EXAM  01/02/2020   MAMMOGRAM  03/23/2023   INFLUENZA VACCINE  06/15/2023   COVID-19 Vaccine (3 - 2024-25  season) 07/16/2023   HEMOGLOBIN A1C  08/15/2023   Zoster Vaccines- Shingrix (1 of 2) Never done   Health Maintenance Items Addressed: Mammogram ordered, Hepatitis C Screening, patient past due for DM foot exam, Pneu, Flu, Covid, DTaP, Shingrix  Additional Screening:  Vision Screening: Recommended annual ophthalmology exams for early detection of glaucoma and other disorders of the eye.  Dental Screening: Recommended annual dental exams for proper oral hygiene  Community Resource Referral / Chronic Care Management: CRR required this visit?  Yes   CCM required this visit?  No     Plan:     I have personally reviewed and noted the following in the patient's chart:   Medical and social history Use of alcohol, tobacco or illicit drugs  Current medications and supplements including opioid prescriptions. Patient is currently taking opioid prescriptions. Information provided to patient regarding non-opioid alternatives. Patient advised to discuss non-opioid treatment plan with their provider. Functional ability and status Nutritional status Physical activity Advanced directives List of other physicians Hospitalizations, surgeries, and ER visits in previous 12 months Vitals Screenings to include cognitive, depression, and falls Referrals and appointments  In addition, I have reviewed and discussed with patient certain preventive protocols, quality metrics, and best practice recommendations. A written personalized care plan for preventive services as well as general preventive health recommendations were provided to patient.     Jordan Hawks Tiziana Cislo, CMA   01/04/2024   After Visit Summary: (MyChart) Due to this being a telephonic visit, the after visit summary with patients personalized plan was offered to patient via MyChart   Notes: Please refer to Routing Comments.

## 2024-01-04 NOTE — Patient Instructions (Signed)
Ms. Semple , Thank you for taking time to come for your Medicare Wellness Visit. I appreciate your ongoing commitment to your health goals. Please review the following plan we discussed and let me know if I can assist you in the future.   Referrals/Orders/Follow-Ups/Clinician Recommendations:   Next Medicare Annual Wellness Visit:  January 09, 2025 at 8:00 am video visit  You have an order for:  []   2D Mammogram  []   3D Mammogram  []   Bone Density     Please call for appointment:  [facility] [address] [place], [place] [address] [phone]  Make sure to wear two-piece clothing.  No lotions powders or deodorants the day of the appointment Make sure to bring picture ID and insurance card.  Bring list of medications you are currently taking including any supplements.   Schedule your [place] screening mammogram through MyChart!   Log into your MyChart account.  Go to 'Visit' (or 'Appointments' if on mobile App) --> Schedule an Appointment  Under 'Select a Reason for Visit' choose the Mammogram Screening option.  Complete the pre-visit questions and select the time and place that best fits your schedule.  You are due for the vaccines checked below. You may have these done at your preferred pharmacy. Please have them fax the office proof of the vaccines so that we can update your chart.   [x]  Flu (due annually)  Recommended this fall either at PCP office or through your local pharmacy. The flu season starts August 1 of each year.   [x]  Shingrix (Shingles vaccine): CDC recommends 2 doses of Shingrix separated by 2-6 months for aged 40 years and older:  [x]  Pneumonia Vaccines: Recommended for adults 65 years or older  [x]  TDAP (Tetanus) Vaccine every 10 years:Recommended every 10 years; Please call your insurance company to determine your out of pocket expense. You also receive this vaccine at your local pharmacy or Health Dept.  [x]   Covid-19: Available now at any [facility] pharmacy (see info below)  You may also get your vaccines at any [facility] (locations listed below.) Vaccine hours are Monday - Friday 9:00 - 4:00. No appointments are required. Most insurances are accepted including Medicaid. Anyone can use the community pharmacies, and people are not required to have a [facility] provider.  Community Pharmacy Locations offering vaccines:   [facility] and exercise psychologist   [facility]  10 vaccines are offered at the [employer]: Covid, flu, Tdap, shingles, RSV, pneumonia, meningococcal, hepatitis A, hepatitis B, and HPV.   A referral has been placed for you to see if there are any additional resources to help you with one of the following:     []   Transportation Needs   []   Utility Needs   [x]   Food Insecurity   []  Assistance with daily activities such as bath, dressing, and managing your medications   []  Housing Insecurity   []  Assistance with your medications  If you haven't heard from anyone within the next 7 business days, please call them and let them know a referral has been placed  Concierge Line: [phone]   If lab work has been ordered for you today, you may have these drawn at the same lab you have your routine lab work drawn for your primary care provider.  Labs Ordered:    This is a list of the screening  recommended for you and due dates:  Health Maintenance  Topic Date Due   Pneumococcal Vaccination (1 of 2 - PCV) Never done   Hepatitis C Screening  Never done   DTaP/Tdap/Td vaccine (1 - Tdap) Never done   Complete foot exam   01/02/2020   Mammogram  03/23/2023   Flu Shot  06/15/2023   COVID-19 Vaccine (3 - 2024-25 season) 07/16/2023   Hemoglobin A1C  08/15/2023   Zoster (Shingles) Vaccine (1 of 2) Never done   Yearly kidney health urinalysis for diabetes  02/13/2024    Eye exam for diabetics  05/22/2024   Yearly kidney function blood test for diabetes  12/10/2024   Medicare Annual Wellness Visit  01/03/2025   Cologuard (Stool DNA test)  11/22/2025   Pap with HPV screening  03/16/2028   HIV Screening  Completed   HPV Vaccine  Aged Out    Advanced directives: (Provided) Advance directive discussed with you today. I have provided a copy for you to complete at home and have notarized. Once this is complete, please bring a copy in to our office so we can scan it into your chart.   Next Medicare Annual Wellness Visit scheduled for next year: yes  Understanding Your Risk for Falls Millions of people have serious injuries from falls each year. It is important to understand your risk of falling. Talk with your health care provider about your risk and what you can do to lower it. If you do have a serious fall, make sure to tell your provider. Falling once raises your risk of falling again. How can falls affect me? Serious injuries from falls are common. These include: Broken bones, such as hip fractures. Head injuries, such as traumatic brain injuries (TBI) or concussions. A fear of falling can cause you to avoid activities and stay at home. This can make your muscles weaker and raise your risk for a fall. What can increase my risk? There are a number of risk factors that increase your risk for falling. The more risk factors you have, the higher your risk of falling. Serious injuries from a fall happen most often to people who are older than 50 years old. Teenagers and young adults ages 78-29 are also at higher risk. Common risk factors include: Weakness in the lower body. Being generally weak or confused due to long-term (chronic) illness. Dizziness or balance problems. Poor vision. Medicines that cause dizziness or drowsiness. These may include: Medicines for your blood pressure, heart, anxiety, insomnia, or swelling (edema). Pain medicines. Muscle  relaxants. Other risk factors include: Drinking alcohol. Having had a fall in the past. Having foot pain or wearing improper footwear. Working at a dangerous job. Having any of the following in your home: Tripping hazards, such as floor clutter or loose rugs. Poor lighting. Pets. Having dementia or memory loss. What actions can I take to lower my risk of falling?     Physical activity Stay physically fit. Do strength and balance exercises. Consider taking a regular class to build strength and balance. Yoga and tai chi are good options. Vision Have your eyes checked every year and your prescription for glasses or contacts updated as needed. Shoes and walking aids Wear non-skid shoes. Wear shoes that have rubber soles and low heels. Do not wear high heels. Do not walk around the house in socks or slippers. Use a cane or walker as told by your provider. Home safety Attach secure railings on both sides of your stairs. Install grab  bars for your bathtub, shower, and toilet. Use a non-skid mat in your bathtub or shower. Attach bath mats securely with double-sided, non-slip rug tape. Use good lighting in all rooms. Keep a flashlight near your bed. Make sure there is a clear path from your bed to the bathroom. Use night-lights. Do not use throw rugs. Make sure all carpeting is taped or tacked down securely. Remove all clutter from walkways and stairways, including extension cords. Repair uneven or broken steps and floors. Avoid walking on icy or slippery surfaces. Walk on the grass instead of on icy or slick sidewalks. Use ice melter to get rid of ice on walkways in the winter. Use a cordless phone. Questions to ask your health care provider Can you help me check my risk for a fall? Do any of my medicines make me more likely to fall? Should I take a vitamin D supplement? What exercises can I do to improve my strength and balance? Should I make an appointment to have my vision  checked? Do I need a bone density test to check for weak bones (osteoporosis)? Would it help to use a cane or a walker? Where to find more information Centers for Disease Control and Prevention, STEADI: TonerPromos.no Community-Based Fall Prevention Programs: TonerPromos.no [employer] on Aging: BaseRingTones.pl Contact a health care provider if: You fall at home. You are afraid of falling at home. You feel weak, drowsy, or dizzy. This information is not intended to replace advice given to you by your health care provider. Make sure you discuss any questions you have with your health care provider. Document Revised: 07/04/2022 Document Reviewed: 07/04/2022 Elsevier Patient Education  2024 Elsevier Inc.   Managing Pain Without Opioids Opioids are strong medicines used to treat moderate to severe pain. For some people, especially those who have long-term (chronic) pain, opioids may not be the best choice for pain management due to: Side effects like nausea, constipation, and sleepiness. The risk of addiction (opioid use disorder). The longer you take opioids, the greater your risk of addiction. Pain that lasts for more than 3 months is called chronic pain. Managing chronic pain usually requires more than one approach and is often provided by a team of health care providers working together (multidisciplinary approach). Pain management may be done at a pain management center or pain clinic. How to manage pain without the use of opioids Use non-opioid medicines Non-opioid medicines for pain may include: Over-the-counter or prescription non-steroidal anti-inflammatory drugs (NSAIDs). These may be the first medicines used for pain. They work well for muscle and bone pain, and they reduce swelling. Acetaminophen. This over-the-counter medicine may work well for milder pain but not swelling. Antidepressants. These may be used to treat chronic pain. A certain type of antidepressant (tricyclics) is often used.  These medicines are given in lower doses for pain than when used for depression. Anticonvulsants. These are usually used to treat seizures but may also reduce nerve (neuropathic) pain. Muscle relaxants. These relieve pain caused by sudden muscle tightening (spasms). You may also use a pain medicine that is applied to the skin as a patch, cream, or gel (topical analgesic), such as a numbing medicine. These may cause fewer side effects than medicines taken by mouth. Do certain therapies as directed Some therapies can help with pain management. They include: Physical therapy. You will do exercises to gain strength and flexibility. A physical therapist may teach you exercises to move and stretch parts of your body that are weak, stiff, or  painful. You can learn these exercises at physical therapy visits and practice them at home. Physical therapy may also involve: Massage. Heat wraps or applying heat or cold to affected areas. Electrical signals that interrupt pain signals (transcutaneous electrical nerve stimulation, TENS). Weak lasers that reduce pain and swelling (low-level laser therapy). Signals from your body that help you learn to regulate pain (biofeedback). Occupational therapy. This helps you to learn ways to function at home and work with less pain. Recreational therapy. This involves trying new activities or hobbies, such as a physical activity or drawing. Mental health therapy, including: Cognitive behavioral therapy (CBT). This helps you learn coping skills for dealing with pain. Acceptance and commitment therapy (ACT) to change the way you think and react to pain. Relaxation therapies, including muscle relaxation exercises and mindfulness-based stress reduction. Pain management counseling. This may be individual, family, or group counseling.  Receive medical treatments Medical treatments for pain management include: Nerve block injections. These may include a pain blocker and  anti-inflammatory medicines. You may have injections: Near the spine to relieve chronic back or neck pain. Into joints to relieve back or joint pain. Into nerve areas that supply a painful area to relieve body pain. Into muscles (trigger point injections) to relieve some painful muscle conditions. A medical device placed near your spine to help block pain signals and relieve nerve pain or chronic back pain (spinal cord stimulation device). Acupuncture. Follow these instructions at home Medicines Take over-the-counter and prescription medicines only as told by your health care provider. If you are taking pain medicine, ask your health care providers about possible side effects to watch out for. Do not drive or use heavy machinery while taking prescription opioid pain medicine. Lifestyle  Do not use drugs or alcohol to reduce pain. If you drink alcohol, limit how much you have to: 0-1 drink a day for women who are not pregnant. 0-2 drinks a day for men. Know how much alcohol is in a drink. In the [place] one drink equals one 12 oz bottle of beer (355 mL), one 5 oz glass of wine (148 mL), or one 1 oz glass of hard liquor (44 mL). Do not use any products that contain nicotine or tobacco. These products include cigarettes, chewing tobacco, and vaping devices, such as e-cigarettes. If you need help quitting, ask your health care provider. Eat a healthy diet and maintain a healthy weight. Poor diet and excess weight may make pain worse. Eat foods that are high in fiber. These include fresh fruits and vegetables, whole grains, and beans. Limit foods that are high in fat and processed sugars, such as fried and sweet foods. Exercise regularly. Exercise lowers stress and may help relieve pain. Ask your health care provider what activities and exercises are safe for you. If your health care provider approves, join an exercise class that combines movement and stress reduction. Examples include yoga and tai  chi. Get enough sleep. Lack of sleep may make pain worse. Lower stress as much as possible. Practice stress reduction techniques as told by your therapist. General instructions Work with all your pain management providers to find the treatments that work best for you. You are an important member of your pain management team. There are many things you can do to reduce pain on your own. Consider joining an online or in-person support group for people who have chronic pain. Keep all follow-up visits. This is important. Where to find more information You can find more information about managing  pain without opioids from: American Academy of Pain Medicine: painmed.org Institute for Chronic Pain: instituteforchronicpain.org American Chronic Pain Association: theacpa.org Contact a health care provider if: You have side effects from pain medicine. Your pain gets worse or does not get better with treatments or home therapy. You are struggling with anxiety or depression. Summary Many types of pain can be managed without opioids. Chronic pain may respond better to pain management without opioids. Pain is best managed when you and a team of health care providers work together. Pain management without opioids may include non-opioid medicines, medical treatments, physical therapy, mental health therapy, and lifestyle changes. Tell your health care providers if your pain gets worse or is not being managed well enough. This information is not intended to replace advice given to you by your health care provider. Make sure you discuss any questions you have with your health care provider. Document Revised: 02/10/2021 Document Reviewed: 02/10/2021 Elsevier Patient Education  2024 [employer].

## 2024-01-30 ENCOUNTER — Ambulatory Visit
Admission: RE | Admit: 2024-01-30 | Discharge: 2024-01-30 | Disposition: A | Discharge status: Home / Self Care | Payer: Medicare Other | Source: Ambulatory Visit | Attending: Nurse Practitioner | Admitting: Nurse Practitioner

## 2024-01-30 DIAGNOSIS — Z1231 Encounter for screening mammogram for malignant neoplasm of breast: Secondary | ICD-10-CM

## 2024-01-30 DIAGNOSIS — Z0000 Encounter for general adult medical examination without abnormal findings: Secondary | ICD-10-CM

## 2024-02-05 ENCOUNTER — Ambulatory Visit: Payer: Self-pay | Admitting: Nurse Practitioner

## 2024-02-20 ENCOUNTER — Encounter (HOSPITAL_BASED_OUTPATIENT_CLINIC_OR_DEPARTMENT_OTHER): Payer: Self-pay

## 2024-02-22 ENCOUNTER — Telehealth: Payer: Self-pay

## 2024-02-22 ENCOUNTER — Other Ambulatory Visit (HOSPITAL_COMMUNITY): Payer: Self-pay

## 2024-02-22 NOTE — Telephone Encounter (Signed)
 Pharmacy Patient Advocate Encounter   Received notification from Physician's Office that prior authorization for REPATHA is required/requested.   Insurance verification completed.   The patient is insured through [facility] .   Per test claim: PA required; PA submitted to above mentioned insurance via CoverMyMeds Key/confirmation #/[id number] [facility] Status is pending

## 2024-02-22 NOTE — Telephone Encounter (Signed)
 PA request has been Submitted. New Encounter has been or will be created for follow up. For additional info see Pharmacy Prior Auth telephone encounter from 02/22/24.

## 2024-02-23 NOTE — Telephone Encounter (Signed)
 Pharmacy Patient Advocate Encounter  Received notification from [facility] that Prior Authorization for REPATHA has been DENIED.  Full denial letter will be uploaded to the media tab. See denial reason below. CRITERIA NOT MET PER PLAN. REQUIRES TRIAL AND FAILURE OF 2 STATINS AND ZETIA.

## 2024-02-29 ENCOUNTER — Ambulatory Visit: Payer: Self-pay | Admitting: Nurse Practitioner

## 2024-03-04 ENCOUNTER — Telehealth: Payer: Self-pay

## 2024-03-04 DIAGNOSIS — K869 Disease of pancreas, unspecified: Secondary | ICD-10-CM

## 2024-03-04 NOTE — Telephone Encounter (Signed)
 Spoke with the pt and made her aware that CT and labs are due. She agrees and CT and labs have been entered.  I have also sent the order to the schedulers to set up.

## 2024-03-04 NOTE — Telephone Encounter (Signed)
-----   Message from Nurse Jemiah Cuadra P sent at 11/01/2023  9:31 AM EST ----- Plan for CT abdomen pancreas protocol in 4 months. Patient before that scan or around that time should have a CA 19-9 and a chromogranin A blood test performed.

## 2024-03-12 ENCOUNTER — Ambulatory Visit (HOSPITAL_COMMUNITY)
Admission: RE | Admit: 2024-03-12 | Discharge: 2024-03-12 | Disposition: A | Discharge status: Home / Self Care | Source: Ambulatory Visit | Attending: Gastroenterology | Admitting: Gastroenterology

## 2024-03-12 ENCOUNTER — Other Ambulatory Visit

## 2024-03-12 DIAGNOSIS — K838 Other specified diseases of biliary tract: Secondary | ICD-10-CM | POA: Diagnosis not present

## 2024-03-12 DIAGNOSIS — K869 Disease of pancreas, unspecified: Secondary | ICD-10-CM

## 2024-03-12 DIAGNOSIS — K8689 Other specified diseases of pancreas: Secondary | ICD-10-CM | POA: Diagnosis not present

## 2024-03-12 DIAGNOSIS — K429 Umbilical hernia without obstruction or gangrene: Secondary | ICD-10-CM | POA: Diagnosis not present

## 2024-03-12 DIAGNOSIS — K862 Cyst of pancreas: Secondary | ICD-10-CM | POA: Diagnosis not present

## 2024-03-12 MED ORDER — SODIUM CHLORIDE (PF) 0.9 % IJ SOLN
INTRAMUSCULAR | Status: AC
  Filled 2024-03-12: qty 50

## 2024-03-12 MED ORDER — IOHEXOL 300 MG/ML  SOLN
100.0000 mL | Freq: Once | INTRAMUSCULAR | Status: AC | PRN
  Administered 2024-03-12: 100 mL via INTRAVENOUS

## 2024-03-13 LAB — CANCER ANTIGEN 19-9: CA 19-9: 8 U/mL (ref <34)

## 2024-03-14 ENCOUNTER — Encounter: Payer: Self-pay | Admitting: Gastroenterology

## 2024-03-14 ENCOUNTER — Ambulatory Visit (INDEPENDENT_AMBULATORY_CARE_PROVIDER_SITE_OTHER): Payer: Medicare Other | Admitting: Family

## 2024-03-14 ENCOUNTER — Encounter (HOSPITAL_BASED_OUTPATIENT_CLINIC_OR_DEPARTMENT_OTHER): Payer: Self-pay | Admitting: Family

## 2024-03-14 VITALS — BP 142/78 | HR 76 | Ht 63.0 in | Wt 215.4 lb

## 2024-03-14 DIAGNOSIS — E1165 Type 2 diabetes mellitus with hyperglycemia: Secondary | ICD-10-CM | POA: Diagnosis not present

## 2024-03-14 DIAGNOSIS — I10 Essential (primary) hypertension: Secondary | ICD-10-CM | POA: Diagnosis not present

## 2024-03-14 DIAGNOSIS — E785 Hyperlipidemia, unspecified: Secondary | ICD-10-CM | POA: Diagnosis not present

## 2024-03-14 DIAGNOSIS — Z8673 Personal history of transient ischemic attack (TIA), and cerebral infarction without residual deficits: Secondary | ICD-10-CM

## 2024-03-14 LAB — CHROMOGRANIN A: Chromogranin A (ng/mL): 46.1 ng/mL (ref 0.0–101.8)

## 2024-03-14 MED ORDER — OZEMPIC (2 MG/DOSE) 8 MG/3ML ~~LOC~~ SOPN: 2.0000 mg | PEN_INJECTOR | SUBCUTANEOUS | 5 refills | Status: DC

## 2024-03-14 MED ORDER — VALSARTAN-HYDROCHLOROTHIAZIDE 320-25 MG PO TABS: 1.0000 | ORAL_TABLET | Freq: Every day | ORAL | 1 refills | Status: AC

## 2024-03-14 MED ORDER — HYDRALAZINE HCL 50 MG PO TABS: 50.0000 mg | ORAL_TABLET | Freq: Two times a day (BID) | ORAL | 1 refills | Status: DC

## 2024-03-14 MED ORDER — EZETIMIBE 10 MG PO TABS: 10.0000 mg | ORAL_TABLET | Freq: Every day | ORAL | 1 refills | Status: DC

## 2024-03-14 MED ORDER — ATORVASTATIN CALCIUM 80 MG PO TABS
80.0000 mg | ORAL_TABLET | Freq: Every day | ORAL | 3 refills | Status: AC
Start: 2024-03-14 — End: ?

## 2024-03-14 MED ORDER — AMLODIPINE BESYLATE 10 MG PO TABS: 10.0000 mg | ORAL_TABLET | Freq: Every evening | ORAL | 1 refills | Status: AC

## 2024-03-14 MED ORDER — SPIRONOLACTONE 50 MG PO TABS: 50.0000 mg | ORAL_TABLET | Freq: Every day | ORAL | 1 refills | Status: AC

## 2024-03-14 MED ORDER — CARVEDILOL 12.5 MG PO TABS: 12.5000 mg | ORAL_TABLET | Freq: Two times a day (BID) | ORAL | 1 refills | Status: DC

## 2024-03-14 NOTE — Patient Instructions (Addendum)
 Medication Instructions:  CHANGE Carvedilol  to 12.5mg  twice daily  Complete your 1mg  Ozempic  at home, then start 2mg  once per week  Start Zetia  10mg  daily    Labwork: Your physician recommends that you return for lab work in 3 months for fasting lipid panel, CMET    Follow-Up: Please follow up in 3 months in [facility] with Dr. Theodis Fiscal, Neomi Banks, NP or Donivan Furry PharmD    Special Instructions:

## 2024-03-14 NOTE — Progress Notes (Signed)
 Cardiology Office Note:  .   Date:  03/14/2024  ID:  Sharon Chan, DOB [birth year], MRN [id number] PCP: Jerrlyn Morel, NP  [facility] Providers Cardiologist:  Maudine Sos, MD    History of Present Illness: .   Sharon Chan is a 48 y.o. female with a history of HTN, CVA, HLD, diabetes, peripheral neuropathy, polycystic kidney disease, sickle cell trait, vitamin B12 deficiency.  [facility]abl[facility] 06/12/2023 with uncontrolled hypertension despite multiple agents and concern for falls related to her high blood pressure.  Echo 08/2022 LVEF 60 to 75%, mild LVH.  Sleep study negative for sleep apnea.  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  Due to chest pain and coronary CTA calcium  score of 0.  Spironolactone  initiated and uptitrated.  Last seen 12/11/2023.  Home BP mostly 150s over 90s, exercising regularly with walking and swimming, frustrated by persistent weight gain.  Hydralazine  50 mg twice daily added.  Ozempic  dose increased.  Prior Auth for Repatha  attempted though insurance plan requires trial of 2 statins plus Zetia .  Metanephrines, catecholamines normal. Renin-aldosterone not consistent with hyperaldosteronism.  Discussed the use of AI scribe software for clinical note transcription with the patient, who gave verbal consent to proceed.  History of Present Illness Sharon Chan is a 48 year old female with hypertension and hyperlipidemia who presents for medication management and follow-up.  Her blood pressure readings at home vary from 150s to 160s, occasionally reaching 210. 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 She engages in daily physical activity, including walking and pool exercises. Her medication regimen includes  Hydralazine  and valsartan  hydrochlorothiazide  are taken in the morning,  amlodipine  in the evening, and carvedilol  twice daily. Atorvastatin  is taken in the morning.  She experiences nausea in the mornings since her Ozempic  dose was increased, which has reduced her appetite and food intake over the past 20 days. Despite this, she maintains hydration and consumes yogurt and protein shakes. Her diet includes yogurt and eggs for breakfast, pinto beans or black beans for lunch, and boiled chicken with vegetables for dinner. Her weight has decreased from 222 lbs in January to 215 lbs currently. Nausea is manageable.    ROS: Please see the history of present illness.    All other systems reviewed and are negative.   Studies Reviewed: .        Cardiac Studies & Procedures   ______________________________________________________________________________________________     ECHOCARDIOGRAM  PCV ECHOCARDIOGRAM COMPLETE W BUBBLE 08/15/2022  Narrative Echo addendum with bubble study 08/12/2022: 1. Normal LV systolic function with visual EF 60-65%. Left ventricle cavity is normal in size. Normal global wall motion. Normal diastolic filling pattern, normal LAP. Mild left ventricular hypertrophy. 2. Agitated saline contrast was injected into a peripheral vein. No intracardiac shunt was seen. A patient foramen ovale was not seen. 3. Mild (Grade I) aortic regurgitation. 4. The aortic root is normal. Mildly dilated proximal ascending aorta 40mm. 5. No prior study for comparison.    MONITORS  LONG TERM MONITOR (3-14 DAYS) 06/09/2023  Narrative 4 Day Zio Monitor  Quality: Fair.  Baseline artifact. Predominant rhythm: Sinus rhythm Average heart rate: 78 bpm Max heart rate: 135 bpm Min heart rate: 54 bpm Pauses >2.5 seconds: none  Rare PACs and PVCs (<1%) Rare ventricular couplets   Devinn C. Theodis Fiscal, MD, [facility] 08/06/2023 8:36 AM  CT SCANS  CT CORONARY MORPH W/CTA COR W/SCORE 06/14/2023  Addendum 06/20/2023  2:15 PM ADDENDUM REPORT: 06/20/2023  14:13  EXAM: OVER-READ INTERPRETATION  PET-CT CHEST  The following report is an over-read performed by radiologist Dr. Kasandra Pain [facility] on 06/20/2023. This over-read does not include interpretation of cardiac or coronary anatomy or pathology. The cardiac CT interpretation by the cardiologist is to be attached.  COMPARISON:  None.  FINDINGS: No evidence for lymphadenopathy within the visualized mediastinum or hilar regions.  The visualized lung parenchyma shows no suspicious pulmonary nodule or mass. No focal airspace consolidation. No effusion.  Visualized portions of the upper abdomen are unremarkable.  No suspicious lytic or sclerotic osseous abnormality.  IMPRESSION: No acute or clinically significant extracardiac findings.   Electronically Signed By: Donnal Fusi M.D. On: 06/20/2023 14:13  Narrative CLINICAL DATA:  This is a 48 year old female with anginal symptom.  EXAM: Cardiac/Coronary  CTA  TECHNIQUE: The patient was scanned on a [employer].  FINDINGS: A 100 kV prospective scan was triggered in the descending thoracic aorta at 111 HU's. Axial non-contrast 3 mm slices were carried out through the heart. The data set was analyzed on a dedicated work station and scored using the Agatson method. Gantry rotation speed was 250 msecs and collimation was .6 mm. No beta blockade and 0.8 mg of sl NTG was given. The 3D data set was reconstructed in 5% intervals of the 67-82 % of the R-R cycle. Diastolic phases were analyzed on a dedicated work station using MPR, MIP and VRT modes. The patient received 80 cc of contrast.  Aorta: Normal size.  No calcifications.  No dissection.  Aortic Valve:  Trileaflet.  No calcifications.  Coronary Arteries:  Normal coronary origin.  Right dominance.  RCA is a large dominant artery that gives rise to PDA and PLA. There is no plaque.  Left main is a large artery that gives rise to LAD and LCX  arteries.  LAD is a large vessel that has no plaque.  LCX is a non-dominant artery that gives rise to one large OM1 branch. There is no plaque.  Coronary Calcium  Score:  Left main: 0  Left anterior descending artery: 0  Left circumflex artery: 0  Right coronary artery: 0  Total: 0  Percentile: 0  Other findings:  Normal pulmonary vein drainage into the left atrium.  Normal left atrial appendage without a thrombus.  Normal size of the pulmonary artery.  IMPRESSION: 1. Coronary calcium  score of 0. This was 0 percentile for age and sex matched control.  2. Normal coronary origin with right dominance.  3. CAD-RADS 0. No evidence of CAD (0%). Consider non-atherosclerotic causes of chest pain.  The noncardiac portion of this study will be interpreted in separate report by the radiologist.  Electronically Signed: By: Kardie  Tobb D.O. On: 06/14/2023 10:31     ______________________________________________________________________________________________      Risk Assessment/Calculations:     HYPERTENSION CONTROL Vitals:   03/14/24 0801 03/14/24 0820  BP: (!) 157/102 (!) 142/78    The patient's blood pressure is elevated above target today.  In order to address the patient's elevated BP:           Physical Exam:   VS:  BP (!) 142/78   Pulse 76   Ht 5\' 3"  (1.6 m)   Wt 215 lb 6.4 oz (97.7 kg)   SpO2 98%   BMI 38.16 kg/m    Wt Readings from  Last 3 Encounters:  03/14/24 215 lb 6.4 oz (97.7 kg)  01/04/24 220 lb (99.8 kg)  12/11/23 222 lb 14.4 oz (101.1 kg)    GEN: Well nourished, well developed in no acute distress NECK: No JVD; No carotid bruits CARDIAC: RRR, no murmurs, rubs, gallops RESPIRATORY:  Clear to auscultation without rales, wheezing or rhonchi  ABDOMEN: Soft, non-tender, non-distended EXTREMITIES:  No edema; No deformity   ASSESSMENT AND PLAN: .    Assessment & Plan Hypertension Blood pressure remains uncontrolled with current  regimen. Headache with hydralazine  initially, now improving.  - Increase carvedilol  to 12.5 mg twice daily. -Continue amlodipine  10 mg daily, hydralazine  50 mg twice daily, spironolactone  50 mg daily. - Discussed to monitor BP at home at least 2 hours after medications and sitting for 5-10 minutes.   Hx of CVA / HLD, LDL goal <70 LDL at 124 mg/dL, target is less than 70 mg/dL. Insurance requires Zetia  trial before Repatha . - Add Zetia  10 mg daily to atorvastatin  80 mg daily. - Recheck cholesterol levels in 3 months.  Not at goal at that time plan to start Repatha .  Obesity / DM2 Weight reduced from 222 lbs to 215 lbs. Ozempic  effective in appetite reduction. Plan to increase dose to aid further weight loss. - Increase Ozempic  to 2 mg. - Encourage intake of protein and fiber rich foods         Dispo: follow up in 3 mos  Signed, Clearnce Curia, NP

## 2024-03-18 ENCOUNTER — Telehealth: Payer: Self-pay | Admitting: Pharmacy Technician

## 2024-03-18 ENCOUNTER — Ambulatory Visit (INDEPENDENT_AMBULATORY_CARE_PROVIDER_SITE_OTHER): Admitting: Nurse Practitioner

## 2024-03-18 ENCOUNTER — Other Ambulatory Visit (HOSPITAL_COMMUNITY): Payer: Self-pay

## 2024-03-18 ENCOUNTER — Encounter: Payer: Self-pay | Admitting: Nurse Practitioner

## 2024-03-18 DIAGNOSIS — E1165 Type 2 diabetes mellitus with hyperglycemia: Secondary | ICD-10-CM

## 2024-03-18 LAB — POCT GLYCOSYLATED HEMOGLOBIN (HGB A1C): Hemoglobin A1C: 5.8 % — AB (ref 4.0–5.6)

## 2024-03-18 NOTE — Telephone Encounter (Signed)
 Pharmacy Patient Advocate Encounter   Received notification from CoverMyMeds that prior authorization for Ozempic  is required/requested.   Insurance verification completed.   The patient is insured through [employer] .   Per test claim: PA required; PA submitted to above mentioned insurance via CoverMyMeds Key/confirmation #/EOC [id number] Status is pending

## 2024-03-18 NOTE — Patient Instructions (Signed)
 1. Type 2 diabetes mellitus with hyperglycemia, without long-term current use of insulin  (HCC)  - Urine Albumin/Creatinine with ratio (send out) [LAB689] - POCT glycosylated hemoglobin (Hb A1C)

## 2024-03-18 NOTE — Progress Notes (Signed)
 Subjective   Patient ID: Sharon Chan, female    DOB: 1975/11/24, 48 y.o.   MRN: [id number]  Chief Complaint  Patient presents with   Diabetes    Referring provider: Jerrlyn Morel, NP  [name] is a 48 y.o. female with Past Medical History: No date: Anxiety 06/01/2023: Chest pain of uncertain etiology No date: CVA (cerebral vascular accident) (HCC) No date: Depression No date: DM type 2 (diabetes mellitus, type 2) (HCC) No date: HLD (hyperlipidemia) No date: HTN (hypertension) No date: Joint pain No date: Migraines No date: Neck pain No date: Obese 06/01/2023: Palpitations No date: Polycystic disease, ovaries No date: Right sided weakness No date: Seizures (HCC) No date: Shoulder pain No date: Sickle cell trait (HCC) No date: Thyroid  nodule 01/2020: Vitamin D  deficiency   HPI  Diabetes:  Sharon Chan is a 48 y.o. female with past medical history of type 2 diabetes without complication, peripheral neuropathy, polycystic kidney disease, stroke, essential hypertension, sickle cell trait, B12 nutritional deficiency presents for follow-up for hypertension. A1C today is 5.8.      Hypertension:    Currently on amlodipine  5 mg daily, valsartan -hydrochlorothiazide  320 - 12.5 mg 1 tablet daily, and Amlodipine . Patient reports blood pressure readings has been around 118 over 80s at home.  She denies dizziness, chest pain, syncope. She reports chronic HA, some bilateral lower extremities edema.    Note: BP elevated in office today. Did not take blood pressure meds this morning. Patient is followed by hypertension clinic.   Denies f/c/s, n/v/d, hemoptysis, PND, leg swelling Denies chest pain or edema   Allergies  Allergen Reactions   Penicillins Anaphylaxis   Lisinopril  Cough   Latex Rash   Other Itching, Rash and Other (See Comments)    Strong sunlight - polymorphous light eruption    Immunization History  Administered Date(s)  Administered   Influenza,inj,Quad PF,6+ Mos 09/12/2017, 10/01/2018, 11/20/2019, 10/18/2022   PFIZER(Purple Top)SARS-COV-2 Vaccination 02/06/2020, 02/25/2020    Tobacco History: Social History   Tobacco Use  Smoking Status Never  Smokeless Tobacco Never   Counseling given: Not Answered   Outpatient Encounter Medications as of 03/18/2024  Medication Sig   albuterol  (VENTOLIN  HFA) 108 (90 Base) MCG/ACT inhaler Inhale 2 puffs into the lungs every 6 (six) hours as needed for wheezing or shortness of breath.   amLODipine  (NORVASC ) 10 MG tablet Take 1 tablet (10 mg total) by mouth every evening.   aspirin  EC 81 MG tablet Take 1 tablet (81 mg total) by mouth in the morning.   atorvastatin  (LIPITOR) 80 MG tablet Take 1 tablet (80 mg total) by mouth daily.   blood glucose meter kit and supplies KIT Dispense based on patient and insurance preference. Use up to four times daily as directed. (FOR ICD-9 250.00, 250.01).   carvedilol  (COREG ) 12.5 MG tablet Take 1 tablet (12.5 mg total) by mouth 2 (two) times daily.   cyclobenzaprine  (FLEXERIL ) 5 MG tablet TAKE ONE TABLET BY MOUTH THREE TIMES DAILY AS NEEDED (VIAL)   diphenhydrAMINE  (BENADRYL ) 25 MG tablet Take 25 mg by mouth every 6 (six) hours as needed for allergies.   ezetimibe  (ZETIA ) 10 MG tablet Take 1 tablet (10 mg total) by mouth daily.   Galcanezumab -gnlm (EMGALITY ) 120 MG/ML SOAJ Inject 1 Pen into the skin every 30 (thirty) days.   hydrALAZINE  (APRESOLINE ) 50 MG tablet Take 1 tablet (50 mg total) by mouth 2 (two) times daily.   nortriptyline  (PAMELOR ) 25 MG capsule Take 3 capsules (  75 mg total) by mouth at bedtime.   OneTouch Delica Lancets 33G MISC TES 2 (TWO) TIMES DAILY.   Semaglutide , 2 MG/DOSE, (OZEMPIC , 2 MG/DOSE,) 8 MG/3ML SOPN Inject 2 mg into the skin once a week.   spironolactone  (ALDACTONE ) 50 MG tablet Take 1 tablet (50 mg total) by mouth daily.   topiramate  (TOPAMAX ) 100 MG tablet Take 1 tablet (100 mg total) by mouth in the  morning AND 2 tablets (200 mg total) every evening.   valsartan -hydrochlorothiazide  (DIOVAN -HCT) 320-25 MG tablet Take 1 tablet by mouth daily.   divalproex  (DEPAKOTE  ER) 500 MG 24 hr tablet Take 1 tablet (500 mg total) by mouth daily. (Patient not taking: Reported on 03/18/2024)   eszopiclone  (LUNESTA ) 1 MG TABS tablet Take 1 tablet (1 mg total) by mouth at bedtime as needed for sleep. Take immediately before bedtime (Patient not taking: Reported on 03/18/2024)   No facility-administered encounter medications on file as of 03/18/2024.    Review of Systems  Review of Systems  Constitutional: Negative.   HENT: Negative.    Cardiovascular: Negative.   Gastrointestinal: Negative.   Allergic/Immunologic: Negative.   Neurological: Negative.   Psychiatric/Behavioral: Negative.       Objective:   BP (!) 158/93   Pulse 73   Temp 98.2 F (36.8 C) (Oral)   Wt 217 lb (98.4 kg)   SpO2 99%   BMI 38.44 kg/m   Wt Readings from Last 5 Encounters:  03/18/24 217 lb (98.4 kg)  03/14/24 215 lb 6.4 oz (97.7 kg)  01/04/24 220 lb (99.8 kg)  12/11/23 222 lb 14.4 oz (101.1 kg)  10/16/23 213 lb (96.6 kg)     Physical Exam Vitals and nursing note reviewed.  Constitutional:      General: She is not in acute distress.    Appearance: She is well-developed.  Cardiovascular:     Rate and Rhythm: Normal rate and regular rhythm.  Pulmonary:     Effort: Pulmonary effort is normal.     Breath sounds: Normal breath sounds.  Neurological:     Mental Status: She is alert and oriented to person, place, and time.       Assessment & Plan:   Type 2 diabetes mellitus with hyperglycemia, without long-term current use of insulin  (HCC) -     Microalbumin / creatinine urine ratio -     POCT glycosylated hemoglobin (Hb A1C)     Return in about 3 months (around 06/18/2024).     Jerrlyn Morel, NP 03/18/2024

## 2024-03-18 NOTE — Telephone Encounter (Signed)
 Pharmacy Patient Advocate Encounter  Received notification from [facility] that Prior Authorization for Ozempic  has been APPROVED from 03/18/24 to 11/13/24. Ran test claim, Copay is $0.00- one month. This test claim was processed through [facility]- copay amounts may vary at other pharmacies due to pharmacy/plan contracts, or as the patient moves through the different stages of their insurance plan.   PA #/Case ID/Reference #: [id number]

## 2024-03-19 LAB — MICROALBUMIN / CREATININE URINE RATIO
Creatinine, Urine: 462.9 mg/dL
Microalb/Creat Ratio: 8 mg/g{creat} (ref 0–29)
Microalbumin, Urine: 35.8 ug/mL

## 2024-03-26 ENCOUNTER — Ambulatory Visit: Payer: Self-pay | Admitting: Gastroenterology

## 2024-04-01 ENCOUNTER — Ambulatory Visit: Payer: 59 | Admitting: Neurology

## 2024-06-20 ENCOUNTER — Ambulatory Visit: Admitting: Nurse Practitioner

## 2024-06-20 ENCOUNTER — Encounter (HOSPITAL_BASED_OUTPATIENT_CLINIC_OR_DEPARTMENT_OTHER): Admitting: Family

## 2024-08-20 ENCOUNTER — Encounter: Payer: Self-pay | Admitting: Neurology

## 2024-08-25 ENCOUNTER — Other Ambulatory Visit (HOSPITAL_BASED_OUTPATIENT_CLINIC_OR_DEPARTMENT_OTHER): Payer: Self-pay | Admitting: Family

## 2024-08-25 DIAGNOSIS — I10 Essential (primary) hypertension: Secondary | ICD-10-CM

## 2024-08-27 ENCOUNTER — Other Ambulatory Visit (HOSPITAL_BASED_OUTPATIENT_CLINIC_OR_DEPARTMENT_OTHER): Payer: Self-pay | Admitting: Family

## 2024-08-27 DIAGNOSIS — E1165 Type 2 diabetes mellitus with hyperglycemia: Secondary | ICD-10-CM

## 2024-08-29 ENCOUNTER — Encounter (HOSPITAL_BASED_OUTPATIENT_CLINIC_OR_DEPARTMENT_OTHER): Payer: Self-pay | Admitting: Family

## 2024-08-29 ENCOUNTER — Ambulatory Visit (HOSPITAL_BASED_OUTPATIENT_CLINIC_OR_DEPARTMENT_OTHER): Admitting: Family

## 2024-08-29 VITALS — BP 146/80 | HR 75 | Ht 63.0 in | Wt 194.3 lb

## 2024-08-29 DIAGNOSIS — I1A0 Resistant hypertension: Secondary | ICD-10-CM | POA: Diagnosis not present

## 2024-08-29 DIAGNOSIS — R0609 Other forms of dyspnea: Secondary | ICD-10-CM

## 2024-08-29 DIAGNOSIS — E785 Hyperlipidemia, unspecified: Secondary | ICD-10-CM

## 2024-08-29 MED ORDER — CARVEDILOL 25 MG PO TABS: 25.0000 mg | ORAL_TABLET | Freq: Two times a day (BID) | ORAL | 1 refills | Status: DC

## 2024-08-29 NOTE — Progress Notes (Signed)
 Cardiology Office Note:  .   Date:  08/29/2024  ID:  Sharon Chan, DOB [birth year], MRN [id number] PCP: Oley Bascom RAMAN, NP  [facility] Providers Cardiologist:  Sharon Scarce, MD    History of Present Illness: .    Discussed the use of AI scribe software for clinical note transcription with the patient, who gave verbal consent to proceed.  History of Present Illness Sharon Chan is a 48 y.o. female with a history of HTN, CVA, HLD, diabetes, peripheral neuropathy, polycystic kidney disease, sickle cell trait, vitamin B12 deficiency.  [facility]/12/2023 with uncontrolled hypertension despite multiple agents and concern for falls related to her high blood pressure.  Echo 08/2022 LVEF 60 to 75%, mild LVH.  Sleep study negative for sleep apnea.  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  Due to chest pain and coronary CTA calcium  score of 0.  Spironolactone  initiated and uptitrated.  Seen 12/11/2023.  Home BP mostly 150s over 90s, exercising regularly with walking and swimming, frustrated by persistent weight gain.  Hydralazine  50 mg twice daily added.  Ozempic  dose increased.  Prior Auth for Repatha  attempted though insurance plan requires trial of 2 statins plus Zetia .  Metanephrines, catecholamines normal. Renin-aldosterone not consistent with hyperaldosteronism.  At visit 03/14/24 Carvedilol  increased to 12.5mg  BID due to elevated BP. Ozempic  further increased to 2mg  weekly. Zetia  added for lipid control.   Sharon Chan is a 47 year old female with hypertension who presents with elevated blood pressure and shortness of breath.  She experiences consistently elevated blood pressure, with readings often in the 180s, both before and after medication. Daily headaches worsen with high blood pressure, causing anxiety about another stroke.   Shortness of breath has been present for  three months and is worsening. It occurs at rest and with activity, more pronounced when lying on her right side at night. No chest pain, coughing, or wheezing.  Her heart rate varies between 50 and 101.  ROS: Please see the history of present illness.    All other systems reviewed and are negative.   Studies Reviewed: SABRA   EKG Interpretation Date/Time:  Thursday August 29 2024 08:17:02 EDT Ventricular Rate:  89 PR Interval:  176 QRS Duration:  78 QT Interval:  368 QTC Calculation: 447 R Axis:   20  Text Interpretation: Normal sinus rhythm No acute ST/T wave changes Confirmed by Vannie Mora ([id number]) on 08/29/2024 8:20:39 AM    Cardiac Studies & Procedures   ______________________________________________________________________________________________     ECHOCARDIOGRAM  PCV ECHOCARDIOGRAM COMPLETE W BUBBLE 08/15/2022  Narrative Echo addendum with bubble study 08/12/2022: 1. Normal LV systolic function with visual EF 60-65%. Left ventricle cavity is normal in size. Normal global wall motion. Normal diastolic filling pattern, normal LAP. Mild left ventricular hypertrophy. 2. Agitated saline contrast was injected into a peripheral vein. No intracardiac shunt was seen. A patient foramen ovale was not seen. 3. Mild (Grade I) aortic regurgitation. 4. The aortic root is normal. Mildly dilated proximal ascending aorta 40mm. 5. No prior study for comparison.    MONITORS  LONG TERM MONITOR (3-14 DAYS) 06/09/2023  Narrative 4 Day Zio Monitor  Quality: Fair.  Baseline artifact. Predominant rhythm: Sinus rhythm Average heart rate: 78 bpm Max heart rate: 135 bpm Min heart rate: 54 bpm Pauses >2.5 seconds: none  Rare PACs and PVCs (<1%) Rare ventricular couplets   Cherri C. Scarce, MD, FACC  08/06/2023 8:36 AM   CT SCANS  CT CORONARY MORPH W/CTA COR W/SCORE 06/14/2023  Addendum 06/20/2023  2:15 PM ADDENDUM REPORT: 06/20/2023 14:13  EXAM: OVER-READ INTERPRETATION   PET-CT CHEST  The following report is an over-read performed by radiologist Dr. Camellia Lang [facility] on 06/20/2023. This over-read does not include interpretation of cardiac or coronary anatomy or pathology. The cardiac CT interpretation by the cardiologist is to be attached.  COMPARISON:  None.  FINDINGS: No evidence for lymphadenopathy within the visualized mediastinum or hilar regions.  The visualized lung parenchyma shows no suspicious pulmonary nodule or mass. No focal airspace consolidation. No effusion.  Visualized portions of the upper abdomen are unremarkable.  No suspicious lytic or sclerotic osseous abnormality.  IMPRESSION: No acute or clinically significant extracardiac findings.   Electronically Signed By: Camellia Candle M.D. On: 06/20/2023 14:13  Narrative CLINICAL DATA:  This is a 48 year old female with anginal symptom.  EXAM: Cardiac/Coronary  CTA  TECHNIQUE: The patient was scanned on a [employer].  FINDINGS: A 100 kV prospective scan was triggered in the descending thoracic aorta at 111 HU's. Axial non-contrast 3 mm slices were carried out through the heart. The data set was analyzed on a dedicated work station and scored using the Agatson method. Gantry rotation speed was 250 msecs and collimation was .6 mm. No beta blockade and 0.8 mg of sl NTG was given. The 3D data set was reconstructed in 5% intervals of the 67-82 % of the R-R cycle. Diastolic phases were analyzed on a dedicated work station using MPR, MIP and VRT modes. The patient received 80 cc of contrast.  Aorta: Normal size.  No calcifications.  No dissection.  Aortic Valve:  Trileaflet.  No calcifications.  Coronary Arteries:  Normal coronary origin.  Right dominance.  RCA is a large dominant artery that gives rise to PDA and PLA. There is no plaque.  Left main is a large artery that gives rise to LAD and LCX arteries.  LAD is a large vessel that  has no plaque.  LCX is a non-dominant artery that gives rise to one large OM1 branch. There is no plaque.  Coronary Calcium  Score:  Left main: 0  Left anterior descending artery: 0  Left circumflex artery: 0  Right coronary artery: 0  Total: 0  Percentile: 0  Other findings:  Normal pulmonary vein drainage into the left atrium.  Normal left atrial appendage without a thrombus.  Normal size of the pulmonary artery.  IMPRESSION: 1. Coronary calcium  score of 0. This was 0 percentile for age and sex matched control.  2. Normal coronary origin with right dominance.  3. CAD-RADS 0. No evidence of CAD (0%). Consider non-atherosclerotic causes of chest pain.  The noncardiac portion of this study will be interpreted in separate report by the radiologist.  Electronically Signed: By: Kardie  Tobb D.O. On: 06/14/2023 10:31     ______________________________________________________________________________________________      Risk Assessment/Calculations:     HYPERTENSION CONTROL Vitals:   08/29/24 0812 08/29/24 0826  BP: (!) 150/96 (!) 146/80    The patient's blood pressure is elevated above target today.  In order to address the patient's elevated BP:           Physical Exam:   VS:  BP (!) 146/80   Pulse 75   Ht 5' 3 (1.6 m)   Wt 194 lb 4.8 oz (88.1 kg)   SpO2 99%   BMI 34.42 kg/m  Vitals:   08/29/24 0812 08/29/24 0826  BP: (!) 150/96 (!) 146/80  Pulse: 75   Height: 5' 3 (1.6 m)   Weight: 194 lb 4.8 oz (88.1 kg)   SpO2: 99%   BMI (Calculated): 34.43     Wt Readings from Last 3 Encounters:  08/29/24 194 lb 4.8 oz (88.1 kg)  03/18/24 217 lb (98.4 kg)  03/14/24 215 lb 6.4 oz (97.7 kg)    GEN: Well nourished, well developed in no acute distress NECK: No JVD; No carotid bruits CARDIAC: RRR, no murmurs, rubs, gallops RESPIRATORY:  Clear to auscultation without rales, wheezing or rhonchi  ABDOMEN: Soft, non-tender, non-distended EXTREMITIES:   No edema; No deformity   ASSESSMENT AND PLAN: .    Assessment & Plan Resistant hypertension BP at goal <130/80.  Continue amlodipine  10 mg daily, hydralazine  50 mg twice daily, spironolactone  50 mg daily, valsartan -HCTZ 320-25 mg daily. -Increase carvedilol  to 25 mg twice daily. -Encouraged bring BP cuff into next clinic visit as home readings higher than office readings. - Conversation initiated regarding renal denervation.  If renal artery duplex unremarkable will plan for prior authorization for renal denervation given poorly controlled hypertension despite medication adherence and lifestyle changes with no secondary cause  Hx of CVA / HLD, LDL goal <55 LDL at 124 mg/dL, target is less than 70 mg/dL. Insurance requires Zetia  trial before Repatha . Zetia  initiated 03/14/24. -update lipid panel, CMET. If LDL not at goal <55, plan to start Repatha .  Obesity / DM2 Congratulated on continued weight loss.  Tolerating Ozempic  2 mg weekly without issue. - Encourage intake of protein and fiber rich foods  DOE Ongoing 3 mos and worsening. Worse with laying on R side. No edema.  -plan for echocardiogra,         Dispo: follow up in 2 mos  Signed, Reche GORMAN Finder, NP

## 2024-08-29 NOTE — Patient Instructions (Addendum)
 Medication Instructions:  CHANGE Carvedilol  25mg  twice per day   Labwork: Your physician recommends that you return for lab work today: CMET, lipid panel   Testing/Procedures: Your physician has requested that you have a renal artery duplex. During this test, an ultrasound is used to evaluate blood flow to the kidneys. Allow one hour for this exam. Do not eat after midnight the day before and avoid carbonated beverages. Take your medications as you usually do.   Your physician has requested that you have an echocardiogram. Echocardiography is a painless test that uses sound waves to create images of your heart. It provides your doctor with information about the size and shape of your heart and how well your heart's chambers and valves are working. This procedure takes approximately one hour. There are no restrictions for this procedure. Please do NOT wear cologne, perfume, aftershave, or lotions (deodorant is allowed). Please arrive 15 minutes prior to your appointment time.  Please note: We ask at that you not bring children with you during ultrasound (echo/ vascular) testing. Due to room size and safety concerns, children are not allowed in the ultrasound rooms during exams. Our front office staff cannot provide observation of children in our lobby area while testing is being conducted. An adult accompanying a patient to their appointment will only be allowed in the ultrasound room at the discretion of the ultrasound technician under special circumstances. We apologize for any inconvenience.    Follow-Up: Please follow up in 2 months in [facility] with Dr. Raford, Reche Finder, NP or Allean Mink PharmD    Special Instructions:    Please bring your blood pressure cuff to your next office visit.   Based on your renal duplex we may consider a renal denervation to help lower blood pressure.

## 2024-08-30 ENCOUNTER — Other Ambulatory Visit (HOSPITAL_COMMUNITY): Payer: Self-pay

## 2024-08-30 ENCOUNTER — Ambulatory Visit (HOSPITAL_BASED_OUTPATIENT_CLINIC_OR_DEPARTMENT_OTHER): Payer: Self-pay | Admitting: Family

## 2024-08-30 ENCOUNTER — Telehealth: Payer: Self-pay

## 2024-08-30 DIAGNOSIS — E785 Hyperlipidemia, unspecified: Secondary | ICD-10-CM

## 2024-08-30 LAB — COMPREHENSIVE METABOLIC PANEL WITH GFR
ALT: 9 IU/L (ref 0–32)
AST: 12 IU/L (ref 0–40)
Albumin: 3.8 g/dL — ABNORMAL LOW (ref 3.9–4.9)
Alkaline Phosphatase: 74 IU/L (ref 41–116)
BUN/Creatinine Ratio: 15 (ref 9–23)
BUN: 11 mg/dL (ref 6–24)
Bilirubin Total: 0.4 mg/dL (ref 0.0–1.2)
CO2: 26 mmol/L (ref 20–29)
Calcium: 9.1 mg/dL (ref 8.7–10.2)
Chloride: 104 mmol/L (ref 96–106)
Creatinine, Ser: 0.73 mg/dL (ref 0.57–1.00)
Globulin, Total: 2.1 g/dL (ref 1.5–4.5)
Glucose: 83 mg/dL (ref 70–99)
Potassium: 4.2 mmol/L (ref 3.5–5.2)
Sodium: 141 mmol/L (ref 134–144)
Total Protein: 5.9 g/dL — ABNORMAL LOW (ref 6.0–8.5)
eGFR: 100 mL/min/1.73 (ref >59)

## 2024-08-30 LAB — LIPID PANEL
Chol/HDL Ratio: 4.4 ratio (ref 0.0–4.4)
Cholesterol, Total: 197 mg/dL (ref 100–199)
HDL: 45 mg/dL (ref >39)
LDL Chol Calc (NIH): 142 mg/dL — ABNORMAL HIGH (ref 0–99)
Triglycerides: 53 mg/dL (ref 0–149)
VLDL Cholesterol Cal: 10 mg/dL (ref 5–40)

## 2024-08-30 MED ORDER — EZETIMIBE 10 MG PO TABS: 10.0000 mg | ORAL_TABLET | Freq: Every day | ORAL | 1 refills | Status: AC

## 2024-08-30 MED ORDER — PRALUENT 150 MG/ML ~~LOC~~ SOAJ: 150.0000 mg | SUBCUTANEOUS | 2 refills | Status: AC

## 2024-08-30 NOTE — Telephone Encounter (Signed)
 Rx for Praluent 150mg  SQ q14 days sent to [facility]. Zetia  refilled as well. Updated lipid panel ordered.  Will update patient via separate MyChart encounter.   Trenna Kiely S Robley [place], NP

## 2024-08-30 NOTE — Telephone Encounter (Signed)
 Per PA team Praluent preferred. Rx sent to [facility]. Patient updated via MyChart.   Adonis Yim S Jazon Jipson, NP

## 2024-08-30 NOTE — Telephone Encounter (Signed)
-----   Message from Reche GORMAN Finder sent at 08/30/2024  8:04 AM EDT ----- Regarding: Repatha  Prior auth for Repatha  for CVA. LDL not at goal <55 despite zetia  and max dose atorvastatin . TY!  Caitlin S Walker, NP

## 2024-08-30 NOTE — Telephone Encounter (Signed)
 Pharmacy Patient Advocate Encounter   Received notification from Physician's Office that prior authorization for REPATHA  is required/requested.   Insurance verification completed.   The patient is insured through [facility].   Per test claim:  PRALUENT is preferred by the insurance.  If suggested medication is appropriate, Please send in a new RX and discontinue this one. If not, please advise as to why it's not appropriate so that we may request a Prior Authorization. Please note, some preferred medications may still require a PA.  If the suggested medications have not been trialed and there are no contraindications to their use, the PA will not be submitted, as it will not be approved.

## 2024-08-30 NOTE — Telephone Encounter (Signed)
 Pharmacy Patient Advocate Encounter   Received notification from Physician's Office that prior authorization for PRALUENT is required/requested.   Insurance verification completed.   The patient is insured through [facility].   Per test claim: PA required; PA submitted to above mentioned insurance via Latent Key/confirmation #/EOC [id number] Status is pending

## 2024-09-02 ENCOUNTER — Other Ambulatory Visit (HOSPITAL_COMMUNITY): Payer: Self-pay

## 2024-09-02 NOTE — Telephone Encounter (Signed)
 Pharmacy Patient Advocate Encounter  Received notification from [employer] that Prior Authorization for praluent has been APPROVED from 08/16/24 to until further notice. Ran test claim, Copay is $0.00- one month. This test claim was processed through [facility]- copay amounts may vary at other pharmacies due to pharmacy/plan contracts, or as the patient moves through the different stages of their insurance plan.   PA #/Case ID/Reference #: [id number]

## 2024-09-03 ENCOUNTER — Telehealth (HOSPITAL_BASED_OUTPATIENT_CLINIC_OR_DEPARTMENT_OTHER): Payer: Self-pay | Admitting: *Deleted

## 2024-09-03 NOTE — Telephone Encounter (Signed)
 Left the pt a message to call the office back to discuss recent results and plan to start her on Praluent, per Reche Finder, NP.

## 2024-09-03 NOTE — Telephone Encounter (Signed)
 Pt returned a call back to the office.  Went over her results and plan per Reche Finder, NP.   Pt states she saw her lab results Caitlin sent her on 10/17, and will pick up the praluent injections from her pharmacy and make note to return to the lab for repeat lipids, after 6-8 doses of her praluent injection.   Pt verbalized understanding and agrees with this plan.

## 2024-09-03 NOTE — Telephone Encounter (Signed)
 Vannie Reche RAMAN, NP to Me (Selected Message)     09/02/24  7:56 PM Has not yet reviewed mychart about praluent - can we please call so she know the change? TY!  Vannie Reche RAMAN, NP    08/30/24  1:09 PM Note Per PA team Praluent preferred. [facility]. Patient updated via MyChart.    Reche RAMAN Vannie, NP     Vannie Reche RAMAN, NP to Annabella JONELLE Bunker     08/30/24  1:09 PM Hi Miss Bunker,    We heard back from your insurance that they prefer Praluent instead which is also a great injectable [id number].    Please pick up Praluent 150mg  from the pharmacy. You will do once injection every 14 days at home. If you have not done injections before and would like help, let us  know and we can get you set up for a nurse visit!   I sent a refill of Zetia  as well. You will continue Atorvastatin  and Zetia  with the Praluent. You will have repeat fasting cholesterol labs after 6-8 doses of Praluent, simply stop by the lab on the 3rd floor when you've done 6-8 doses. Based on those repeat labs we may be able to stop the Zetia  but I want to be sure we get your cholesterol controlled first!   Best,  Reche RAMAN Vannie, NP   This MyChart message has not been read. Comprehensive metabolic panel with GFR; Lipid panel Vannie Reche RAMAN, NP to [facility] Triage     08/30/24  8:04 AM Result Note Normal kidneys, liver. LDL (bad cholesterol) elevated despite zetia  and max dose atorvastatin .   Recommend start repatha  140mg  every 14 days.  Note routed to prior auth team for approval.    Repeat FLP after 6-8 doses. Comprehensive metabolic panel with GFR; Lipid panel Vannie Reche RAMAN, NP to [facility]     08/30/24  8:04 AM Normal kidneys, liver. LDL (bad cholesterol) elevated despite zetia  and max dose atorvastatin .   Recommend start repatha  140mg  every 14 days. Our prior authorization team will work on the approval from insurance and we will let you know when it is approved!    We will plan to repeat cholesterol labs after 6-8 doses of Repatha .

## 2024-09-27 ENCOUNTER — Telehealth: Payer: Self-pay

## 2024-09-27 ENCOUNTER — Ambulatory Visit (HOSPITAL_BASED_OUTPATIENT_CLINIC_OR_DEPARTMENT_OTHER): Payer: Self-pay | Admitting: Family

## 2024-09-27 ENCOUNTER — Other Ambulatory Visit (HOSPITAL_BASED_OUTPATIENT_CLINIC_OR_DEPARTMENT_OTHER)

## 2024-09-27 ENCOUNTER — Ambulatory Visit (INDEPENDENT_AMBULATORY_CARE_PROVIDER_SITE_OTHER)

## 2024-09-27 DIAGNOSIS — R0609 Other forms of dyspnea: Secondary | ICD-10-CM | POA: Diagnosis not present

## 2024-09-27 DIAGNOSIS — I10 Essential (primary) hypertension: Secondary | ICD-10-CM | POA: Diagnosis not present

## 2024-09-27 DIAGNOSIS — I1A0 Resistant hypertension: Secondary | ICD-10-CM

## 2024-09-27 DIAGNOSIS — K862 Cyst of pancreas: Secondary | ICD-10-CM

## 2024-09-27 NOTE — Telephone Encounter (Signed)
-----   Message from Nurse Desia Saban P sent at 03/27/2024 11:25 AM EDT ----- recommend a pancreas protocol CT abdomen to be performed again. Also to have repeat chromogranin A and CA 19-9 drawn.

## 2024-09-27 NOTE — Telephone Encounter (Signed)
 The pt has been advised and agrees to the CT and labs. Orders have been entered. CT order sent to the schedulers to set up.

## 2024-09-28 LAB — ECHOCARDIOGRAM COMPLETE
AR max vel: 2.6 cm2
AV Area VTI: 2.39 cm2
AV Area mean vel: 2.52 cm2
AV Mean grad: 3 mmHg
AV Peak grad: 5.7 mmHg
AV Vena cont: 0.37 cm
Ao pk vel: 1.19 m/s
Area-P 1/2: 4.99 cm2
S' Lateral: 3.12 cm

## 2024-10-01 ENCOUNTER — Ambulatory Visit (HOSPITAL_COMMUNITY)
Admission: RE | Admit: 2024-10-01 | Discharge: 2024-10-01 | Disposition: A | Discharge status: Home / Self Care | Source: Ambulatory Visit | Attending: Gastroenterology | Admitting: Gastroenterology

## 2024-10-01 DIAGNOSIS — K862 Cyst of pancreas: Secondary | ICD-10-CM | POA: Diagnosis present

## 2024-10-01 MED ORDER — IOHEXOL 300 MG/ML  SOLN
100.0000 mL | Freq: Once | INTRAMUSCULAR | Status: AC | PRN
  Administered 2024-10-01: 100 mL via INTRAVENOUS

## 2024-10-01 MED ORDER — SODIUM CHLORIDE (PF) 0.9 % IJ SOLN
INTRAMUSCULAR | Status: AC
  Filled 2024-10-01: qty 50

## 2024-10-07 ENCOUNTER — Ambulatory Visit: Payer: Self-pay | Admitting: Gastroenterology

## 2024-10-17 ENCOUNTER — Other Ambulatory Visit

## 2024-10-17 DIAGNOSIS — K862 Cyst of pancreas: Secondary | ICD-10-CM

## 2024-10-18 LAB — CANCER ANTIGEN 19-9: CA 19-9: 8 U/mL (ref <34)

## 2024-10-19 LAB — CHROMOGRANIN A: Chromogranin A (ng/mL): 59.4 ng/mL (ref 0.0–101.8)

## 2024-11-01 ENCOUNTER — Encounter: Payer: Self-pay | Admitting: Neurology

## 2024-11-01 ENCOUNTER — Ambulatory Visit: Admitting: Neurology

## 2024-11-01 VITALS — BP 167/114 | HR 76 | Ht 63.0 in | Wt 184.4 lb

## 2024-11-01 DIAGNOSIS — R519 Headache, unspecified: Secondary | ICD-10-CM | POA: Diagnosis not present

## 2024-11-01 DIAGNOSIS — F445 Conversion disorder with seizures or convulsions: Secondary | ICD-10-CM | POA: Diagnosis not present

## 2024-11-01 DIAGNOSIS — Z8673 Personal history of transient ischemic attack (TIA), and cerebral infarction without residual deficits: Secondary | ICD-10-CM

## 2024-11-01 MED ORDER — DIVALPROEX SODIUM ER 500 MG PO TB24: 500.0000 mg | ORAL_TABLET | Freq: Every day | ORAL | 3 refills | Status: AC

## 2024-11-01 MED ORDER — NORTRIPTYLINE HCL 50 MG PO CAPS: ORAL_CAPSULE | ORAL | 3 refills | Status: AC

## 2024-11-01 MED ORDER — AIMOVIG 140 MG/ML ~~LOC~~ SOAJ: 1.0000 | SUBCUTANEOUS | 11 refills | Status: AC

## 2024-11-01 MED ORDER — TOPIRAMATE 100 MG PO TABS: ORAL_TABLET | ORAL | 3 refills | Status: AC
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
## 2024-11-01 NOTE — Patient Instructions (Addendum)
 Good to see you!  Let's switch to a different monthly injection, a prescription was sent for Aimovig to take every 30 days  2. Restart Depakote  ER 500mg  every night  3. Increase Nortriptyline  to 50mg : take 2 capsules (100mg ) every night  4. Continue Topiramate  100mg  in AM, 200mg  in PM  5. Continue physical therapy and regular exercise  6. Continue daily aspirin , follow-up with Cardiology on BP  7. Follow-up in 6 months, call for any changes

## 2024-11-19 ENCOUNTER — Encounter (HOSPITAL_BASED_OUTPATIENT_CLINIC_OR_DEPARTMENT_OTHER): Admitting: Cardiovascular Disease

## 2024-11-22 ENCOUNTER — Other Ambulatory Visit (HOSPITAL_BASED_OUTPATIENT_CLINIC_OR_DEPARTMENT_OTHER): Payer: Self-pay | Admitting: Family

## 2024-11-22 DIAGNOSIS — I1A0 Resistant hypertension: Secondary | ICD-10-CM

## 2024-11-22 DIAGNOSIS — I10 Essential (primary) hypertension: Secondary | ICD-10-CM

## 2024-11-22 MED ORDER — CARVEDILOL 25 MG PO TABS: 25.0000 mg | ORAL_TABLET | Freq: Two times a day (BID) | ORAL | 2 refills | Status: AC

## 2025-01-07 ENCOUNTER — Ambulatory Visit

## 2025-01-09 ENCOUNTER — Ambulatory Visit: Payer: Self-pay

## 2025-01-29 ENCOUNTER — Encounter (HOSPITAL_BASED_OUTPATIENT_CLINIC_OR_DEPARTMENT_OTHER): Admitting: Cardiovascular Disease

## 2025-05-19 ENCOUNTER — Ambulatory Visit: Payer: Self-pay | Admitting: Neurology
